# Patient Record
Sex: Male | Born: 1935 | Race: White | Hispanic: No | Marital: Married | Smoking: Former smoker
Health system: Southern US, Community
[De-identification: ages and names within clinical notes are randomized; demographics above are authoritative.]

## PROBLEM LIST (undated history)

## (undated) DIAGNOSIS — I6529 Occlusion and stenosis of unspecified carotid artery: Secondary | ICD-10-CM

## (undated) DIAGNOSIS — I5032 Chronic diastolic (congestive) heart failure: Secondary | ICD-10-CM

## (undated) DIAGNOSIS — G459 Transient cerebral ischemic attack, unspecified: Secondary | ICD-10-CM

## (undated) DIAGNOSIS — I10 Essential (primary) hypertension: Secondary | ICD-10-CM

## (undated) DIAGNOSIS — IMO0002 Reserved for concepts with insufficient information to code with codable children: Secondary | ICD-10-CM

## (undated) DIAGNOSIS — E039 Hypothyroidism, unspecified: Secondary | ICD-10-CM

## (undated) DIAGNOSIS — Z952 Presence of prosthetic heart valve: Secondary | ICD-10-CM

## (undated) DIAGNOSIS — K219 Gastro-esophageal reflux disease without esophagitis: Secondary | ICD-10-CM

## (undated) DIAGNOSIS — N3941 Urge incontinence: Secondary | ICD-10-CM

## (undated) DIAGNOSIS — M069 Rheumatoid arthritis, unspecified: Secondary | ICD-10-CM

## (undated) DIAGNOSIS — E785 Hyperlipidemia, unspecified: Secondary | ICD-10-CM

## (undated) DIAGNOSIS — I2510 Atherosclerotic heart disease of native coronary artery without angina pectoris: Secondary | ICD-10-CM

## (undated) DIAGNOSIS — I639 Cerebral infarction, unspecified: Secondary | ICD-10-CM

## (undated) DIAGNOSIS — B20 Human immunodeficiency virus [HIV] disease: Secondary | ICD-10-CM

## (undated) DIAGNOSIS — I482 Chronic atrial fibrillation, unspecified: Secondary | ICD-10-CM

## (undated) DIAGNOSIS — C4490 Unspecified malignant neoplasm of skin, unspecified: Secondary | ICD-10-CM

## (undated) HISTORY — PX: CATARACT EXTRACTION W/ INTRAOCULAR LENS  IMPLANT, BILATERAL: SHX1307

## (undated) HISTORY — DX: Essential (primary) hypertension: I10

## (undated) HISTORY — DX: Hypothyroidism, unspecified: E03.9

## (undated) HISTORY — DX: Hyperlipidemia, unspecified: E78.5

## (undated) HISTORY — DX: Human immunodeficiency virus (HIV) disease: B20

## (undated) HISTORY — PX: EYE SURGERY: SHX253

## (undated) HISTORY — DX: Rheumatoid arthritis, unspecified: M06.9

## (undated) HISTORY — PX: CARDIAC CATHETERIZATION: SHX172

## (undated) HISTORY — DX: Chronic atrial fibrillation, unspecified: I48.20

## (undated) HISTORY — DX: Occlusion and stenosis of unspecified carotid artery: I65.29

## (undated) HISTORY — DX: Atherosclerotic heart disease of native coronary artery without angina pectoris: I25.10

---

## 2004-03-29 ENCOUNTER — Encounter: Admission: RE | Admit: 2004-03-29 | Discharge: 2004-03-29 | Payer: Self-pay | Admitting: Family Medicine

## 2004-10-10 ENCOUNTER — Ambulatory Visit: Payer: Self-pay | Admitting: Family Medicine

## 2004-11-05 ENCOUNTER — Ambulatory Visit: Payer: Self-pay | Admitting: *Deleted

## 2004-11-14 ENCOUNTER — Ambulatory Visit: Payer: Self-pay | Admitting: Internal Medicine

## 2005-04-08 ENCOUNTER — Ambulatory Visit: Payer: Self-pay | Admitting: Cardiology

## 2005-04-10 ENCOUNTER — Ambulatory Visit: Payer: Self-pay | Admitting: Cardiology

## 2005-06-28 ENCOUNTER — Inpatient Hospital Stay (HOSPITAL_COMMUNITY): Admission: EM | Admit: 2005-06-28 | Discharge: 2005-06-30 | Payer: Self-pay | Admitting: Emergency Medicine

## 2005-06-30 ENCOUNTER — Ambulatory Visit: Payer: Self-pay | Admitting: Cardiology

## 2005-07-02 ENCOUNTER — Ambulatory Visit: Payer: Self-pay | Admitting: Cardiology

## 2005-07-08 ENCOUNTER — Ambulatory Visit: Payer: Self-pay

## 2005-07-08 ENCOUNTER — Ambulatory Visit: Payer: Self-pay | Admitting: Cardiology

## 2005-07-16 ENCOUNTER — Ambulatory Visit: Payer: Self-pay | Admitting: Cardiology

## 2005-07-18 ENCOUNTER — Ambulatory Visit: Payer: Self-pay | Admitting: Cardiology

## 2005-09-23 ENCOUNTER — Ambulatory Visit: Payer: Self-pay | Admitting: *Deleted

## 2005-09-30 ENCOUNTER — Ambulatory Visit: Payer: Self-pay | Admitting: Cardiology

## 2005-10-02 ENCOUNTER — Ambulatory Visit: Payer: Self-pay | Admitting: Cardiology

## 2006-02-11 ENCOUNTER — Ambulatory Visit: Payer: Self-pay | Admitting: Family Medicine

## 2006-03-30 ENCOUNTER — Ambulatory Visit: Payer: Self-pay | Admitting: *Deleted

## 2006-04-02 ENCOUNTER — Ambulatory Visit: Payer: Self-pay | Admitting: Cardiology

## 2006-05-28 ENCOUNTER — Ambulatory Visit: Payer: Self-pay | Admitting: Family Medicine

## 2006-06-23 ENCOUNTER — Inpatient Hospital Stay (HOSPITAL_COMMUNITY): Admission: EM | Admit: 2006-06-23 | Discharge: 2006-06-24 | Payer: Self-pay | Admitting: Emergency Medicine

## 2006-06-23 ENCOUNTER — Ambulatory Visit: Payer: Self-pay | Admitting: Cardiology

## 2006-06-23 ENCOUNTER — Encounter: Payer: Self-pay | Admitting: Cardiology

## 2006-07-08 ENCOUNTER — Ambulatory Visit: Payer: Self-pay | Admitting: *Deleted

## 2006-09-09 ENCOUNTER — Ambulatory Visit: Payer: Self-pay | Admitting: *Deleted

## 2006-09-28 ENCOUNTER — Ambulatory Visit: Payer: Self-pay | Admitting: *Deleted

## 2006-09-28 LAB — CONVERTED CEMR LAB
Alkaline Phosphatase: 48 units/L (ref 39–117)
Bilirubin, Direct: 0.2 mg/dL (ref 0.0–0.3)
Chol/HDL Ratio, serum: 3.4
LDL Cholesterol: 60 mg/dL (ref 0–99)
Total Bilirubin: 0.9 mg/dL (ref 0.3–1.2)
Total Protein: 6.4 g/dL (ref 6.0–8.3)
VLDL: 13 mg/dL (ref 0–40)

## 2006-10-01 ENCOUNTER — Ambulatory Visit: Payer: Self-pay | Admitting: Internal Medicine

## 2006-10-20 ENCOUNTER — Ambulatory Visit: Payer: Self-pay | Admitting: *Deleted

## 2007-01-04 ENCOUNTER — Ambulatory Visit: Payer: Self-pay | Admitting: *Deleted

## 2007-01-26 ENCOUNTER — Encounter: Admission: RE | Admit: 2007-01-26 | Discharge: 2007-01-26 | Payer: Self-pay | Admitting: Sports Medicine

## 2007-02-22 ENCOUNTER — Encounter: Admission: RE | Admit: 2007-02-22 | Discharge: 2007-02-22 | Payer: Self-pay | Admitting: Sports Medicine

## 2007-02-26 ENCOUNTER — Encounter: Admission: RE | Admit: 2007-02-26 | Discharge: 2007-02-26 | Payer: Self-pay | Admitting: Sports Medicine

## 2007-03-24 ENCOUNTER — Ambulatory Visit: Payer: Self-pay | Admitting: *Deleted

## 2007-03-24 LAB — CONVERTED CEMR LAB
ALT: 18 units/L (ref 0–40)
Albumin: 3.4 g/dL — ABNORMAL LOW (ref 3.5–5.2)
BUN: 7 mg/dL (ref 6–23)
Calcium: 9.1 mg/dL (ref 8.4–10.5)
LDL Cholesterol: 50 mg/dL (ref 0–99)
Potassium: 4.1 meq/L (ref 3.5–5.1)
Sodium: 140 meq/L (ref 135–145)
Total Bilirubin: 1 mg/dL (ref 0.3–1.2)
Total CHOL/HDL Ratio: 2.7
Triglycerides: 126 mg/dL (ref 0–149)
VLDL: 25 mg/dL (ref 0–40)

## 2007-03-29 ENCOUNTER — Ambulatory Visit: Payer: Self-pay | Admitting: Cardiovascular Disease

## 2007-04-06 ENCOUNTER — Ambulatory Visit: Payer: Self-pay | Admitting: *Deleted

## 2007-04-14 ENCOUNTER — Ambulatory Visit: Payer: Self-pay

## 2007-06-15 DIAGNOSIS — I2510 Atherosclerotic heart disease of native coronary artery without angina pectoris: Secondary | ICD-10-CM | POA: Insufficient documentation

## 2007-06-15 DIAGNOSIS — Z8679 Personal history of other diseases of the circulatory system: Secondary | ICD-10-CM | POA: Insufficient documentation

## 2007-06-15 DIAGNOSIS — I252 Old myocardial infarction: Secondary | ICD-10-CM | POA: Insufficient documentation

## 2007-06-15 DIAGNOSIS — E785 Hyperlipidemia, unspecified: Secondary | ICD-10-CM

## 2007-06-15 DIAGNOSIS — I10 Essential (primary) hypertension: Secondary | ICD-10-CM

## 2007-07-16 ENCOUNTER — Ambulatory Visit: Payer: Self-pay | Admitting: Cardiology

## 2007-07-30 ENCOUNTER — Ambulatory Visit: Payer: Self-pay | Admitting: Cardiology

## 2007-07-30 LAB — CONVERTED CEMR LAB
Basophils Absolute: 0 10*3/uL (ref 0.0–0.1)
Basophils Relative: 0.2 % (ref 0.0–1.0)
Eosinophils Absolute: 0.3 10*3/uL (ref 0.0–0.6)
Eosinophils Relative: 2.4 % (ref 0.0–5.0)
HCT: 38.2 % — ABNORMAL LOW (ref 39.0–52.0)
Hemoglobin: 12.9 g/dL — ABNORMAL LOW (ref 13.0–17.0)
MCHC: 33.9 g/dL (ref 30.0–36.0)
Monocytes Relative: 8 % (ref 3.0–11.0)
Neutro Abs: 7.5 10*3/uL (ref 1.4–7.7)
WBC: 10.7 10*3/uL — ABNORMAL HIGH (ref 4.5–10.5)

## 2007-08-12 ENCOUNTER — Ambulatory Visit: Payer: Self-pay | Admitting: Cardiology

## 2007-09-23 ENCOUNTER — Ambulatory Visit: Payer: Self-pay | Admitting: Cardiology

## 2007-09-23 LAB — CONVERTED CEMR LAB
Albumin: 3.5 g/dL (ref 3.5–5.2)
Alkaline Phosphatase: 33 units/L — ABNORMAL LOW (ref 39–117)
Cholesterol: 96 mg/dL (ref 0–200)
HDL: 31.7 mg/dL — ABNORMAL LOW (ref >39.0)
Total Bilirubin: 0.6 mg/dL (ref 0.3–1.2)
Total CHOL/HDL Ratio: 3

## 2007-09-30 ENCOUNTER — Ambulatory Visit: Payer: Self-pay | Admitting: Cardiology

## 2007-10-28 ENCOUNTER — Ambulatory Visit: Payer: Self-pay | Admitting: Cardiovascular Disease

## 2007-10-28 ENCOUNTER — Ambulatory Visit: Payer: Self-pay | Admitting: Internal Medicine

## 2007-10-28 LAB — CONVERTED CEMR LAB
Digitoxin Lvl: 0.7 ng/mL — ABNORMAL LOW (ref 0.8–2.0)
GFR calc Af Amer: 120 mL/min
Potassium: 3.9 meq/L (ref 3.5–5.1)

## 2007-11-02 ENCOUNTER — Ambulatory Visit: Payer: Self-pay | Admitting: Cardiology

## 2007-11-09 ENCOUNTER — Ambulatory Visit: Payer: Self-pay | Admitting: Internal Medicine

## 2007-11-16 ENCOUNTER — Ambulatory Visit: Payer: Self-pay | Admitting: Cardiology

## 2007-11-22 ENCOUNTER — Ambulatory Visit: Payer: Self-pay | Admitting: Cardiology

## 2007-11-30 ENCOUNTER — Ambulatory Visit: Payer: Self-pay | Admitting: Cardiology

## 2007-12-14 ENCOUNTER — Ambulatory Visit: Payer: Self-pay | Admitting: Internal Medicine

## 2007-12-14 ENCOUNTER — Ambulatory Visit: Payer: Self-pay | Admitting: Cardiovascular Disease

## 2007-12-22 ENCOUNTER — Ambulatory Visit: Payer: Self-pay | Admitting: Internal Medicine

## 2007-12-22 ENCOUNTER — Ambulatory Visit: Payer: Self-pay | Admitting: Cardiology

## 2007-12-28 ENCOUNTER — Ambulatory Visit: Payer: Self-pay | Admitting: Internal Medicine

## 2008-01-04 ENCOUNTER — Ambulatory Visit: Payer: Self-pay | Admitting: Internal Medicine

## 2008-01-18 ENCOUNTER — Ambulatory Visit: Payer: Self-pay | Admitting: Internal Medicine

## 2008-01-28 ENCOUNTER — Ambulatory Visit: Payer: Self-pay | Admitting: Internal Medicine

## 2008-02-15 ENCOUNTER — Ambulatory Visit: Payer: Self-pay | Admitting: Internal Medicine

## 2008-02-24 ENCOUNTER — Ambulatory Visit: Payer: Self-pay | Admitting: Family Medicine

## 2008-02-24 DIAGNOSIS — J309 Allergic rhinitis, unspecified: Secondary | ICD-10-CM | POA: Insufficient documentation

## 2008-02-24 LAB — CONVERTED CEMR LAB
ALT: 30 units/L (ref 0–53)
AST: 31 units/L (ref 0–37)
Alkaline Phosphatase: 43 units/L (ref 39–117)
Calcium: 8.8 mg/dL (ref 8.4–10.5)
Eosinophils Absolute: 0.3 10*3/uL (ref 0.0–0.7)
Eosinophils Relative: 4 % (ref 0.0–5.0)
Hemoglobin: 12.4 g/dL — ABNORMAL LOW (ref 13.0–17.0)
LDL Cholesterol: 63 mg/dL (ref 0–99)
MCHC: 32.8 g/dL (ref 30.0–36.0)
MCV: 100.1 fL — ABNORMAL HIGH (ref 78.0–100.0)
Monocytes Relative: 7.8 % (ref 3.0–12.0)
Neutro Abs: 6.3 10*3/uL (ref 1.4–7.7)
PSA: 0.43 ng/mL (ref 0.10–4.00)
Platelets: 174 10*3/uL (ref 150–400)
Potassium: 5.4 meq/L — ABNORMAL HIGH (ref 3.5–5.1)
RBC: 3.8 M/uL — ABNORMAL LOW (ref 4.22–5.81)
RDW: 17 % — ABNORMAL HIGH (ref 11.5–14.6)
Sodium: 137 meq/L (ref 135–145)
TSH: 4.07 microintl units/mL (ref 0.35–5.50)
Total Protein: 5.7 g/dL — ABNORMAL LOW (ref 6.0–8.3)
VLDL: 19 mg/dL (ref 0–40)
WBC: 8.3 10*3/uL (ref 4.5–10.5)

## 2008-03-14 ENCOUNTER — Ambulatory Visit: Payer: Self-pay | Admitting: Internal Medicine

## 2008-03-16 ENCOUNTER — Ambulatory Visit: Payer: Self-pay | Admitting: Cardiology

## 2008-03-16 LAB — CONVERTED CEMR LAB
ALT: 37 units/L (ref 0–53)
Alkaline Phosphatase: 49 units/L (ref 39–117)
HDL: 43.3 mg/dL (ref >39.0)
Triglycerides: 63 mg/dL (ref 0–149)
VLDL: 13 mg/dL (ref 0–40)

## 2008-04-11 ENCOUNTER — Ambulatory Visit: Payer: Self-pay | Admitting: Internal Medicine

## 2008-05-09 ENCOUNTER — Ambulatory Visit: Payer: Self-pay | Admitting: Internal Medicine

## 2008-05-29 ENCOUNTER — Ambulatory Visit: Payer: Self-pay | Admitting: Family Medicine

## 2008-06-02 ENCOUNTER — Ambulatory Visit: Payer: Self-pay | Admitting: Internal Medicine

## 2008-06-08 ENCOUNTER — Ambulatory Visit: Payer: Self-pay | Admitting: Internal Medicine

## 2008-06-09 ENCOUNTER — Telehealth: Payer: Self-pay | Admitting: Family Medicine

## 2008-06-15 ENCOUNTER — Ambulatory Visit: Payer: Self-pay | Admitting: Internal Medicine

## 2008-06-19 ENCOUNTER — Ambulatory Visit: Payer: Self-pay | Admitting: Internal Medicine

## 2008-06-30 ENCOUNTER — Ambulatory Visit: Payer: Self-pay | Admitting: Internal Medicine

## 2008-08-02 ENCOUNTER — Ambulatory Visit: Payer: Self-pay | Admitting: Internal Medicine

## 2008-08-12 ENCOUNTER — Ambulatory Visit: Payer: Self-pay | Admitting: Family Medicine

## 2008-08-12 ENCOUNTER — Telehealth (INDEPENDENT_AMBULATORY_CARE_PROVIDER_SITE_OTHER): Payer: Self-pay | Admitting: *Deleted

## 2008-08-12 DIAGNOSIS — J029 Acute pharyngitis, unspecified: Secondary | ICD-10-CM | POA: Insufficient documentation

## 2008-08-13 ENCOUNTER — Encounter: Payer: Self-pay | Admitting: Family Medicine

## 2008-08-16 ENCOUNTER — Ambulatory Visit: Payer: Self-pay | Admitting: Internal Medicine

## 2008-08-29 ENCOUNTER — Ambulatory Visit: Payer: Self-pay | Admitting: Internal Medicine

## 2008-08-29 ENCOUNTER — Ambulatory Visit: Payer: Self-pay | Admitting: Family Medicine

## 2008-09-21 ENCOUNTER — Ambulatory Visit: Payer: Self-pay | Admitting: Cardiology

## 2008-09-21 LAB — CONVERTED CEMR LAB
AST: 41 units/L — ABNORMAL HIGH (ref 0–37)
Albumin: 3.8 g/dL (ref 3.5–5.2)
Bilirubin, Direct: 0.2 mg/dL (ref 0.0–0.3)
Cholesterol: 97 mg/dL (ref 0–200)
LDL Cholesterol: 45 mg/dL (ref 0–99)
Total CHOL/HDL Ratio: 2.6
Triglycerides: 76 mg/dL (ref 0–149)

## 2008-09-25 ENCOUNTER — Ambulatory Visit: Payer: Self-pay | Admitting: Internal Medicine

## 2008-09-28 ENCOUNTER — Ambulatory Visit: Payer: Self-pay | Admitting: Cardiology

## 2008-10-23 ENCOUNTER — Ambulatory Visit: Payer: Self-pay | Admitting: Family Medicine

## 2008-10-24 ENCOUNTER — Ambulatory Visit: Payer: Self-pay | Admitting: Internal Medicine

## 2008-11-21 ENCOUNTER — Ambulatory Visit: Payer: Self-pay | Admitting: Internal Medicine

## 2008-12-06 ENCOUNTER — Ambulatory Visit: Payer: Self-pay | Admitting: Internal Medicine

## 2008-12-20 ENCOUNTER — Ambulatory Visit: Payer: Self-pay | Admitting: Internal Medicine

## 2008-12-31 ENCOUNTER — Ambulatory Visit: Payer: Self-pay | Admitting: Diagnostic Radiology

## 2008-12-31 ENCOUNTER — Emergency Department (HOSPITAL_BASED_OUTPATIENT_CLINIC_OR_DEPARTMENT_OTHER): Admission: EM | Admit: 2008-12-31 | Discharge: 2008-12-31 | Payer: Self-pay | Admitting: Emergency Medicine

## 2009-01-01 ENCOUNTER — Ambulatory Visit: Payer: Self-pay | Admitting: Cardiology

## 2009-01-01 ENCOUNTER — Ambulatory Visit: Payer: Self-pay | Admitting: Internal Medicine

## 2009-01-08 ENCOUNTER — Ambulatory Visit: Payer: Self-pay | Admitting: Internal Medicine

## 2009-01-08 LAB — CONVERTED CEMR LAB: Prothrombin Time: 49.2 s — ABNORMAL HIGH (ref 10.9–13.3)

## 2009-01-11 DIAGNOSIS — R197 Diarrhea, unspecified: Secondary | ICD-10-CM | POA: Insufficient documentation

## 2009-01-12 ENCOUNTER — Ambulatory Visit: Payer: Self-pay | Admitting: Family Medicine

## 2009-01-12 DIAGNOSIS — I4891 Unspecified atrial fibrillation: Secondary | ICD-10-CM

## 2009-01-16 ENCOUNTER — Ambulatory Visit: Payer: Self-pay | Admitting: Cardiovascular Disease

## 2009-01-23 ENCOUNTER — Ambulatory Visit: Payer: Self-pay | Admitting: Internal Medicine

## 2009-02-02 ENCOUNTER — Ambulatory Visit: Payer: Self-pay | Admitting: Cardiology

## 2009-02-02 ENCOUNTER — Ambulatory Visit: Payer: Self-pay | Admitting: Cardiovascular Disease

## 2009-02-02 ENCOUNTER — Ambulatory Visit: Payer: Self-pay | Admitting: Internal Medicine

## 2009-02-07 ENCOUNTER — Ambulatory Visit: Payer: Self-pay | Admitting: Cardiology

## 2009-02-14 ENCOUNTER — Ambulatory Visit: Payer: Self-pay | Admitting: Internal Medicine

## 2009-02-21 ENCOUNTER — Ambulatory Visit: Payer: Self-pay | Admitting: Cardiology

## 2009-03-02 ENCOUNTER — Ambulatory Visit: Payer: Self-pay | Admitting: Cardiology

## 2009-03-16 ENCOUNTER — Ambulatory Visit: Payer: Self-pay | Admitting: Cardiology

## 2009-03-27 ENCOUNTER — Ambulatory Visit: Payer: Self-pay | Admitting: Cardiology

## 2009-03-29 ENCOUNTER — Ambulatory Visit: Payer: Self-pay | Admitting: Cardiology

## 2009-03-29 ENCOUNTER — Encounter: Payer: Self-pay | Admitting: Cardiology

## 2009-03-29 LAB — CONVERTED CEMR LAB
Albumin: 3.6 g/dL (ref 3.5–5.2)
Alkaline Phosphatase: 54 units/L (ref 39–117)
HDL goal, serum: 40 mg/dL
LDL Cholesterol: 48 mg/dL (ref 0–99)
LDL Goal: 100 mg/dL
Total CHOL/HDL Ratio: 3
Triglycerides: 63 mg/dL (ref 0.0–149.0)

## 2009-04-13 ENCOUNTER — Ambulatory Visit: Payer: Self-pay | Admitting: Cardiology

## 2009-04-24 ENCOUNTER — Encounter: Payer: Self-pay | Admitting: *Deleted

## 2009-04-27 ENCOUNTER — Ambulatory Visit: Payer: Self-pay | Admitting: Cardiology

## 2009-04-27 LAB — CONVERTED CEMR LAB: Protime: 16.4

## 2009-05-18 ENCOUNTER — Ambulatory Visit: Payer: Self-pay | Admitting: Cardiology

## 2009-05-18 ENCOUNTER — Encounter (INDEPENDENT_AMBULATORY_CARE_PROVIDER_SITE_OTHER): Payer: Self-pay | Admitting: Pharmacist

## 2009-05-18 LAB — CONVERTED CEMR LAB
POC INR: 5.7
Prothrombin Time: 29 s

## 2009-05-25 ENCOUNTER — Ambulatory Visit: Payer: Self-pay | Admitting: Cardiovascular Disease

## 2009-05-25 LAB — CONVERTED CEMR LAB: POC INR: 2.1

## 2009-05-30 ENCOUNTER — Encounter: Payer: Self-pay | Admitting: *Deleted

## 2009-06-08 ENCOUNTER — Ambulatory Visit: Payer: Self-pay | Admitting: Cardiology

## 2009-06-08 LAB — CONVERTED CEMR LAB
POC INR: 5.1
Prothrombin Time: 27.3 s

## 2009-06-15 ENCOUNTER — Ambulatory Visit: Payer: Self-pay | Admitting: Internal Medicine

## 2009-06-26 ENCOUNTER — Encounter (INDEPENDENT_AMBULATORY_CARE_PROVIDER_SITE_OTHER): Payer: Self-pay | Admitting: *Deleted

## 2009-07-06 ENCOUNTER — Ambulatory Visit: Payer: Self-pay | Admitting: Internal Medicine

## 2009-07-27 ENCOUNTER — Ambulatory Visit: Payer: Self-pay | Admitting: Cardiology

## 2009-08-07 ENCOUNTER — Encounter: Payer: Self-pay | Admitting: Cardiology

## 2009-08-07 ENCOUNTER — Telehealth (INDEPENDENT_AMBULATORY_CARE_PROVIDER_SITE_OTHER): Payer: Self-pay

## 2009-08-08 ENCOUNTER — Ambulatory Visit: Payer: Self-pay

## 2009-08-08 ENCOUNTER — Ambulatory Visit: Payer: Self-pay | Admitting: Cardiology

## 2009-08-14 ENCOUNTER — Ambulatory Visit: Payer: Self-pay

## 2009-08-14 ENCOUNTER — Encounter: Payer: Self-pay | Admitting: Cardiology

## 2009-08-29 ENCOUNTER — Ambulatory Visit: Payer: Self-pay | Admitting: Cardiology

## 2009-09-07 ENCOUNTER — Ambulatory Visit: Payer: Self-pay | Admitting: Family Medicine

## 2009-09-19 ENCOUNTER — Ambulatory Visit: Payer: Self-pay | Admitting: Internal Medicine

## 2009-10-01 ENCOUNTER — Ambulatory Visit: Payer: Self-pay | Admitting: Cardiology

## 2009-10-03 LAB — CONVERTED CEMR LAB
Albumin: 3.6 g/dL (ref 3.5–5.2)
Alkaline Phosphatase: 59 units/L (ref 39–117)
HDL: 43.8 mg/dL (ref >39.00)
LDL Cholesterol: 51 mg/dL (ref 0–99)
Total Bilirubin: 1 mg/dL (ref 0.3–1.2)
Total CHOL/HDL Ratio: 2
Total Protein: 6 g/dL (ref 6.0–8.3)

## 2009-10-04 ENCOUNTER — Ambulatory Visit: Payer: Self-pay | Admitting: Internal Medicine

## 2009-10-17 ENCOUNTER — Ambulatory Visit: Payer: Self-pay | Admitting: Internal Medicine

## 2009-11-14 ENCOUNTER — Ambulatory Visit: Payer: Self-pay | Admitting: Internal Medicine

## 2009-12-12 ENCOUNTER — Ambulatory Visit: Payer: Self-pay | Admitting: Cardiovascular Disease

## 2009-12-12 LAB — CONVERTED CEMR LAB: POC INR: 3

## 2010-01-09 ENCOUNTER — Ambulatory Visit: Payer: Self-pay | Admitting: Internal Medicine

## 2010-02-07 ENCOUNTER — Ambulatory Visit: Payer: Self-pay | Admitting: Internal Medicine

## 2010-02-07 LAB — CONVERTED CEMR LAB: POC INR: 3.3

## 2010-03-07 ENCOUNTER — Ambulatory Visit: Payer: Self-pay | Admitting: Internal Medicine

## 2010-03-07 LAB — CONVERTED CEMR LAB: POC INR: 2.5

## 2010-03-08 ENCOUNTER — Telehealth (INDEPENDENT_AMBULATORY_CARE_PROVIDER_SITE_OTHER): Payer: Self-pay | Admitting: *Deleted

## 2010-03-13 ENCOUNTER — Telehealth: Payer: Self-pay | Admitting: Cardiology

## 2010-03-30 ENCOUNTER — Inpatient Hospital Stay (HOSPITAL_COMMUNITY): Admission: EM | Admit: 2010-03-30 | Discharge: 2010-04-02 | Payer: Self-pay | Admitting: Emergency Medicine

## 2010-03-30 ENCOUNTER — Ambulatory Visit: Payer: Self-pay | Admitting: Cardiology

## 2010-03-31 ENCOUNTER — Encounter (INDEPENDENT_AMBULATORY_CARE_PROVIDER_SITE_OTHER): Payer: Self-pay | Admitting: Internal Medicine

## 2010-03-31 LAB — CONVERTED CEMR LAB: LDL Cholesterol: 37 mg/dL

## 2010-04-01 ENCOUNTER — Ambulatory Visit: Payer: Self-pay | Admitting: Vascular Surgery

## 2010-04-01 ENCOUNTER — Encounter (INDEPENDENT_AMBULATORY_CARE_PROVIDER_SITE_OTHER): Payer: Self-pay | Admitting: Internal Medicine

## 2010-04-02 ENCOUNTER — Encounter: Payer: Self-pay | Admitting: Cardiology

## 2010-04-02 ENCOUNTER — Encounter (INDEPENDENT_AMBULATORY_CARE_PROVIDER_SITE_OTHER): Payer: Self-pay | Admitting: Internal Medicine

## 2010-04-04 ENCOUNTER — Ambulatory Visit: Payer: Self-pay | Admitting: Internal Medicine

## 2010-04-04 ENCOUNTER — Ambulatory Visit: Payer: Self-pay | Admitting: Family Medicine

## 2010-04-16 ENCOUNTER — Ambulatory Visit: Payer: Self-pay | Admitting: Internal Medicine

## 2010-04-16 ENCOUNTER — Ambulatory Visit: Payer: Self-pay | Admitting: Cardiology

## 2010-04-16 LAB — CONVERTED CEMR LAB: POC INR: 3.7

## 2010-04-30 ENCOUNTER — Ambulatory Visit: Payer: Self-pay | Admitting: Cardiology

## 2010-04-30 ENCOUNTER — Ambulatory Visit: Payer: Self-pay | Admitting: Vascular Surgery

## 2010-04-30 LAB — CONVERTED CEMR LAB: POC INR: 2.4

## 2010-05-14 ENCOUNTER — Ambulatory Visit: Payer: Self-pay | Admitting: Cardiology

## 2010-05-14 LAB — CONVERTED CEMR LAB: POC INR: 2.8

## 2010-06-11 ENCOUNTER — Ambulatory Visit: Payer: Self-pay | Admitting: Internal Medicine

## 2010-06-11 LAB — CONVERTED CEMR LAB: POC INR: 1.6

## 2010-06-12 ENCOUNTER — Ambulatory Visit: Payer: Self-pay | Admitting: Cardiology

## 2010-06-19 LAB — CONVERTED CEMR LAB
ALT: 29 units/L (ref 0–53)
AST: 47 units/L — ABNORMAL HIGH (ref 0–37)
Calcium: 8.3 mg/dL — ABNORMAL LOW (ref 8.4–10.5)
Cholesterol: 83 mg/dL (ref 0–200)
Digitoxin Lvl: 0.3 ng/mL — ABNORMAL LOW (ref 0.8–2.0)
GFR calc non Af Amer: 134.2 mL/min (ref >60)
HDL: 32.9 mg/dL — ABNORMAL LOW (ref >39.00)
Potassium: 4.3 meq/L (ref 3.5–5.1)
Sodium: 140 meq/L (ref 135–145)
Total Bilirubin: 0.9 mg/dL (ref 0.3–1.2)
Total Protein: 5.2 g/dL — ABNORMAL LOW (ref 6.0–8.3)
Triglycerides: 35 mg/dL (ref 0.0–149.0)
VLDL: 7 mg/dL (ref 0.0–40.0)

## 2010-06-26 ENCOUNTER — Encounter: Payer: Self-pay | Admitting: Cardiology

## 2010-06-26 ENCOUNTER — Ambulatory Visit: Payer: Self-pay | Admitting: Cardiology

## 2010-06-26 LAB — CONVERTED CEMR LAB: POC INR: 3.8

## 2010-06-27 ENCOUNTER — Encounter: Payer: Self-pay | Admitting: Cardiology

## 2010-07-10 ENCOUNTER — Ambulatory Visit: Payer: Self-pay | Admitting: Cardiology

## 2010-07-22 ENCOUNTER — Ambulatory Visit: Payer: Self-pay | Admitting: Cardiology

## 2010-08-15 ENCOUNTER — Ambulatory Visit: Payer: Self-pay | Admitting: Family Medicine

## 2010-08-19 ENCOUNTER — Ambulatory Visit: Payer: Self-pay | Admitting: Cardiovascular Disease

## 2010-08-19 LAB — CONVERTED CEMR LAB: POC INR: 2.7

## 2010-08-22 ENCOUNTER — Telehealth: Payer: Self-pay | Admitting: Cardiology

## 2010-09-16 ENCOUNTER — Ambulatory Visit: Payer: Self-pay | Admitting: Internal Medicine

## 2010-09-24 ENCOUNTER — Ambulatory Visit: Payer: Self-pay | Admitting: Cardiology

## 2010-10-03 ENCOUNTER — Encounter: Payer: Self-pay | Admitting: Cardiology

## 2010-10-08 ENCOUNTER — Ambulatory Visit: Payer: Self-pay | Admitting: Cardiology

## 2010-10-09 LAB — CONVERTED CEMR LAB
ALT: 24 units/L (ref 0–53)
AST: 37 units/L (ref 0–37)
Albumin: 3.5 g/dL (ref 3.5–5.2)
HDL: 51.1 mg/dL (ref >39.00)
Total Bilirubin: 1.1 mg/dL (ref 0.3–1.2)
Total CHOL/HDL Ratio: 2
Triglycerides: 39 mg/dL (ref 0.0–149.0)
VLDL: 7.8 mg/dL (ref 0.0–40.0)

## 2010-10-10 ENCOUNTER — Ambulatory Visit: Payer: Self-pay

## 2010-10-14 ENCOUNTER — Ambulatory Visit: Payer: Self-pay | Admitting: Cardiovascular Disease

## 2010-10-14 LAB — CONVERTED CEMR LAB: POC INR: 2.5

## 2010-11-11 ENCOUNTER — Ambulatory Visit: Payer: Self-pay | Admitting: Internal Medicine

## 2010-11-11 LAB — CONVERTED CEMR LAB: POC INR: 1.9

## 2010-12-09 ENCOUNTER — Ambulatory Visit: Admission: RE | Admit: 2010-12-09 | Discharge: 2010-12-09 | Payer: Self-pay | Source: Home / Self Care

## 2010-12-09 LAB — CONVERTED CEMR LAB: POC INR: 1.8

## 2010-12-26 NOTE — Assessment & Plan Note (Signed)
Summary: rov/sp   Visit Type:  Follow-up Referring Provider:  Valera Castle Primary Provider:  Aundra Dubin, MD  CC:  dyslipidemia follow-up.  History of Present Illness:    Lipid Clinic Visit      The patient comes in today for dyslipidemia follow-up.  The patient does not complain of chest pain, muscle aches, and muscle cramps. He was just discharged from the hospital yesterday for TIA after stoping Coumadin for dental procedure.  His lipid panel was completed in the hospital.  Dietary compliance review reveals pt is limiting fats and TFA's.  Review of exercise habits reveals that the patient is working with a physical therapist at his home and they have given him exercises for his ankles, feet and legs.  He does have a stationary bike in his home and wants to start riding it again.    Lipid Management Provider  Weston Brass, PharmD  Current Medications (verified): 1)  Niaspan 1000 Mg Tbcr (Niacin (Antihyperlipidemic)) .... Take 1 Tabs At Bedtime 2)  Aspirin 81 Mg  Tbec (Aspirin) .... Take One Tablet Daily 3)  Lipitor 80 Mg  Tabs (Atorvastatin Calcium) .... Take 1/2 Tablet At Bedtime 4)  Centrum Silver   Tabs (Multiple Vitamins-Minerals) .... Once Daily 5)  Digoxin 0.125 Mg  Tabs (Digoxin) .... Once Daily 6)  Warfarin Sodium 5 Mg  Tabs (Warfarin Sodium) .... Take As Directed By Coumadin Clinic. 7)  Caltrate 600+d 600-400 Mg-Unit Tabs (Calcium Carbonate-Vitamin D) .Marland Kitchen.. 1 Tab Once Daily 8)  Methotrexate 2.5 Mg Tabs (Methotrexate Sodium) .... Take 4 Tabs Saturday and Sunday 9)  Nitroglycerin 0.4 Mg Subl (Nitroglycerin) .... One Tablet Under Tongue Every 5 Minutes As Needed For Chest Pain---May Repeat Times Three 10)  Loratadine 10 Mg Tabs (Loratadine) .... 1 Tab Once Daily 11)  Metoprolol Tartrate 50 Mg Tabs (Metoprolol Tartrate) .... 1  Tab Twice Daily 12)  Tramadol Hcl 50 Mg Tabs (Tramadol Hcl) .... Take 1 Tablet 2-3 Times Daily As Needed  Allergies (verified): No Known Drug  Allergies  Past History:  Past Medical History: Last updated: 01/12/2009 Coronary artery disease Hypertension Myocardial infarction, hx of x 2 Transient ischemic attack, hx of Hyperlipidemia Allergic rhinitis Atrial fibrillation  Family History: Last updated: 02/24/2008 father died 72, and line mother died at 84 of old age had a AAA  4 brothers one died in [place] to died of heart attackstwo sisters one died of an MI.  One died of stroke and MI  Social History: Last updated: 10/07/2008 Former Smoker Retired Married 4 daughters Alcohol use-no Drug use-no Regular exercise-yes   Vital Signs:  Patient profile:   77 year old male Height:      72 inches Weight:      210 pounds BMI:     28 .58 Pulse rate:   60 / minute BP sitting:   92 / 70  (right arm)  Impression & Recommendations:  Problem # 1:  HYPERLIPIDEMIA (ICD-272.4) Assessment Improved Pt's Lipid panel from the hospital was as follows: TC- 72, TG- 25, HDL- 30, LDL- 37 (goal<70).   Given the fact his LDL continues to decrease and is well below goal, we discussed the need to decrease his medications at this time.  We would want to keep his LDL >40 to prevent any problems with cell membrane or steroid formation.   We will decrease his Lipitor to 1/2 tablet each night and decrease Niaspan to 1 gm every night.  I encouraged him to continue his heart-healthy diet.  He currently does have PT coming to the home.  He is very willing to start increasing his exercise and wants to get back on his statonary bike.  Encouraged him to work with PT and start riding his bike for 10-20 minutes, most days of the week.  Will recheck lipid panel in  6 months.    His updated medication list for this problem includes:    Lipitor 40 Mg Tabs (Atorvastatin calcium) .Marland Kitchen... Take one tab by mouth once daily  Patient Instructions: 1)  Decrease Lipitor to 1/2 tablet each night 2)  Decrease Niaspan to 1 gm at night 3)  Increase exercise to 10-20  min. on bike 3-4 days a week 4)  Recheck labs on 11/14 at the [place] office 5)  Next Lipid Clinic Appt: 11/17 at 10:30   Lipid Panel Test Date: 03/31/2010                        Value        Units        H/L   Reference  Cholesterol:          72           mg/dL         L    (423-536) LDL Cholesterol:      37           mg/dL         L    (14-431) HDL Cholesterol:      30           mg/dL              (54-00) Triglyceride:         25           mg/dL         L    (86-761)

## 2010-12-26 NOTE — Medication Information (Signed)
Summary: rov/sp  Anticoagulant Therapy  Managed by: Eda Keys, PharmD Referring MD: Valera Castle MD PCP: Aundra Dubin, MD Supervising MD: Gala Romney MD, Reuel Boom Indication 1: Atrial Fibrillation (ICD-427.31) Indication 2: Transcient Ischemic Attack Lab Used: [facility] Site: [facility] INR POC 3.7 INR RANGE 2 - 3  Dietary changes: yes       Details: pts diet has not been the same since getting dentures  Health status changes: no    Bleeding/hemorrhagic complications: no    Recent/future hospitalizations: no    Any changes in medication regimen? no    Recent/future dental: no  Any missed doses?: no       Is patient compliant with meds? yes      Comments: Pt will have multiple dental procedures to fix dentures this coming summer and will keep [place] posted on progresss.  Hopefully, the patient will be regaining his regular diet once dentures are fixed.    Allergies: No Known Drug Allergies  Anticoagulation Management History:      The patient is taking warfarin and comes in today for a routine follow up visit.  Positive risk factors for bleeding include an age of 75 years or older.  The bleeding index is 'intermediate risk'.  Positive CHADS2 values include History of HTN and Age > 75 years old.  The start date was 10/18/2007.  His last INR was 3.6.  Anticoagulation responsible provider: Beatriz Quintela MD, Reuel Boom.  INR POC: 3.7.  Cuvette Lot#: [id number].  Exp: 06/2011.    Anticoagulation Management Assessment/Plan:      The patient's current anticoagulation dose is Warfarin sodium 5 mg  tabs: Take as directed by coumadin clinic..  The target INR is 2.0-3.0.  The next INR is due 04/30/2010.  Anticoagulation instructions were given to patient.  Results were reviewed/authorized by Eda Keys, PharmD.  He was notified by Eda Keys.         Prior Anticoagulation Instructions: INR 4.2  Skip today's dose of Coumadin then decrease dose to 1 tablet every day except 1/2  tablet on Tuesday and Friday   Current Anticoagulation Instructions: INR 3.7  Do NOT take coumadin today.  Then start NEW dosing schedule of 1/2 tablet on Monday, Wednesday, and Friday and 1 tablet all other days.  Return to clinic in 2 weeks.

## 2010-12-26 NOTE — Medication Information (Signed)
Summary: rov/sp  Anticoagulant Therapy  Managed by: Cloyde Reams, RN, BSN Referring MD: Valera Castle MD PCP: Aundra Dubin, MD Supervising MD: Daleen Squibb MD, Maisie Fus Indication 1: Atrial Fibrillation (ICD-427.31) Indication 2: Transcient Ischemic Attack Lab Used: [facility] Site: [facility] INR POC 1.8 INR RANGE 2 - 3  Dietary changes: no    Health status changes: no    Bleeding/hemorrhagic complications: no    Recent/future hospitalizations: no    Any changes in medication regimen? no    Recent/future dental: no  Any missed doses?: no       Is patient compliant with meds? yes       Allergies: No Known Drug Allergies  Anticoagulation Management History:      The patient is taking warfarin and comes in today for a routine follow up visit.  Positive risk factors for bleeding include an age of 27 years or older.  The bleeding index is 'intermediate risk'.  Positive CHADS2 values include History of HTN and Age > 54 years old.  The start date was 10/18/2007.  His last INR was 3.6.  Anticoagulation responsible provider: Daleen Squibb MD, Maisie Fus.  INR POC: 1.8.  Cuvette Lot#: [id number].  Exp: 12/2011.    Anticoagulation Management Assessment/Plan:      The patient's current anticoagulation dose is Warfarin sodium 5 mg  tabs: Take as directed by coumadin clinic..  The target INR is 2.0-3.0.  The next INR is due 12/30/2010.  Anticoagulation instructions were given to patient.  Results were reviewed/authorized by Cloyde Reams, RN, BSN.  He was notified by Cloyde Reams RN.         Prior Anticoagulation Instructions: INR 1.9  Take 1 tablet today then resume same dose of 1 tablet every day except 1/2 tablet on Monday, Wednesday and Friday.  Recheck INR in 4 weeks.   Current Anticoagulation Instructions: INR 1.8  Take 1 tablet today, then start taking 1 tablet daily except 1/2 tablet on Mondays and Fridays.  Recheck in 3 weeks.

## 2010-12-26 NOTE — Medication Information (Signed)
Summary: rov/eac  Anticoagulant Therapy  Managed by: Weston Brass, PharmD Referring MD: Valera Castle MD PCP: Aundra Dubin, MD Supervising MD: Daleen Squibb MD, Maisie Fus Indication 1: Atrial Fibrillation (ICD-427.31) Indication 2: Transcient Ischemic Attack Lab Used: [facility]  Site: [facility] INR POC 2.4 INR RANGE 2 - 3  Dietary changes: no    Health status changes: no    Bleeding/hemorrhagic complications: no    Recent/future hospitalizations: no       Details: was in the hospital recently for afib  Any changes in medication regimen? no    Recent/future dental: no  Any missed doses?: no       Is patient compliant with meds? yes       Allergies: No Known Drug Allergies  Anticoagulation Management History:      The patient is taking warfarin and comes in today for a routine follow up visit.  Positive risk factors for bleeding include an age of 75 years or older.  The bleeding index is 'intermediate risk'.  Positive CHADS2 values include History of HTN and Age > 1 years old.  The start date was 10/18/2007.  His last INR was 3.6.  Anticoagulation responsible provider: Daleen Squibb MD, Maisie Fus.  INR POC: 2.4.  Cuvette Lot#: [id number].  Exp: 06/2011.    Anticoagulation Management Assessment/Plan:      The patient's current anticoagulation dose is Warfarin sodium 5 mg  tabs: Take as directed by coumadin clinic..  The target INR is 2.0-3.0.  The next INR is due 05/14/2010.  Anticoagulation instructions were given to patient.  Results were reviewed/authorized by Weston Brass, PharmD.  He was notified by Weston Brass PharmD.         Prior Anticoagulation Instructions: INR 3.7  Do NOT take coumadin today.  Then start NEW dosing schedule of 1/2 tablet on Monday, Wednesday, and Friday and 1 tablet all other days.  Return to clinic in 2 weeks.    Current Anticoagulation Instructions: INR 2.4  Continue same dose of 1 tablet every day except 1/2 tablet on Monday, Wednesday, and Friday

## 2010-12-26 NOTE — Assessment & Plan Note (Signed)
Summary: flu shot//ccm   Nurse Visit   Allergies: No Known Drug Allergies  Orders Added: 1)  Flu Vaccine 3yrs + MEDICARE PATIENTS [Q2039] 2)  Administration Flu vaccine - MCR [G0008] Flu Vaccine Consent Questions     Do you have a history of severe allergic reactions to this vaccine? no    Any prior history of allergic reactions to egg and/or gelatin? no    Do you have a sensitivity to the preservative Thimersol? no    Do you have a past history of Guillan-Barre Syndrome? no    Do you currently have an acute febrile illness? no    Have you ever had a severe reaction to latex? no    Vaccine information given and explained to patient? yes    Are you currently pregnant? no    Lot Number:[id number]   Exp Date:05/24/2011   Site Given  Left Deltoid IM.lbmedflu 

## 2010-12-26 NOTE — Letter (Signed)
Summary: [facility] Office Note   [facility] Office Note   Imported By: Roderic Ovens 11/07/2010 15:42:43  _____________________________________________________________________  External Attachment:    Type:   Image     Comment:   External Document

## 2010-12-26 NOTE — Medication Information (Signed)
Summary: rov/sl   Anticoagulant Therapy  Managed by: Reina Fuse, PharmD Referring MD: Valera Castle MD PCP: Aundra Dubin, MD Supervising MD: Juanda Chance MD, Tewana Bohlen Indication 1: Atrial Fibrillation (ICD-427.31) Indication 2: Transcient Ischemic Attack Lab Used: [facility] Site: [facility] INR POC 2.6 INR RANGE 2 - 3  Dietary changes: no    Health status changes: no    Bleeding/hemorrhagic complications: no    Recent/future hospitalizations: no    Any changes in medication regimen? no    Recent/future dental: yes     Details: Has been pending a dental extraction. No appointment yet.   Any missed doses?: no       Is patient compliant with meds? yes       Allergies: No Known Drug Allergies  Anticoagulation Management History:      The patient is taking warfarin and comes in today for a routine follow up visit.  Positive risk factors for bleeding include an age of 75 years or older.  The bleeding index is 'intermediate risk'.  Positive CHADS2 values include History of HTN and Age > 45 years old.  The start date was 10/18/2007.  His last INR was 3.6.  Anticoagulation responsible provider: Juanda Chance MD, Smitty Cords.  INR POC: 2.6.  Cuvette Lot#: [id number].  Exp: 08/2011.    Anticoagulation Management Assessment/Plan:      The patient's current anticoagulation dose is Warfarin sodium 5 mg  tabs: Take as directed by coumadin clinic..  The target INR is 2.0-3.0.  The next INR is due 07/31/2010.  Anticoagulation instructions were given to patient.  Results were reviewed/authorized by Reina Fuse, PharmD.  He was notified by Reina Fuse, PharmD.         Prior Anticoagulation Instructions: INR 3.8  Hold Coumadin on Wed, August 3.  Then continue with Coumadin 1 tablet (5 mg) on Sun, Tue, Thu, Sat and 1/2 tablet (2.5 mg) on Mon, Wed, Fri.  Return to clinic in 2 weeks.    Please call when dental appointment scheduled if need to hold Coumadin.  Current Anticoagulation Instructions: INR  2.6  Continue taking Coumadin 1 tab (5 mg) on Sun, Tues, Thur, Sat and Coumadin 0.5 tab (2.5 mg) on Mon, Wed, Fri. Return to clinic in 3 weeks.

## 2010-12-26 NOTE — Medication Information (Signed)
Summary: rov/sp  Anticoagulant Therapy  Managed by: Weston Brass, PharmD Referring MD: Valera Castle MD PCP: Aundra Dubin, MD Supervising MD: Riley Kill MD, Maisie Fus Indication 1: Atrial Fibrillation (ICD-427.31) Indication 2: Transcient Ischemic Attack Lab Used: [facility] Site: [facility] INR POC 2.8 INR RANGE 2 - 3  Dietary changes: no    Health status changes: no    Bleeding/hemorrhagic complications: no    Recent/future hospitalizations: no    Any changes in medication regimen? no    Recent/future dental: yes     Details: recently had some dental work done   Any missed doses?: no       Is patient compliant with meds? yes       Allergies: No Known Drug Allergies  Anticoagulation Management History:      The patient is taking warfarin and comes in today for a routine follow up visit.  Positive risk factors for bleeding include an age of 75 years or older.  The bleeding index is 'intermediate risk'.  Positive CHADS2 values include History of HTN and Age > 51 years old.  The start date was 10/18/2007.  His last INR was 3.6.  Anticoagulation responsible provider: Riley Kill MD, Maisie Fus.  INR POC: 2.8.  Cuvette Lot#: [id number].  Exp: 07/2011.    Anticoagulation Management Assessment/Plan:      The patient's current anticoagulation dose is Warfarin sodium 5 mg  tabs: Take as directed by coumadin clinic..  The target INR is 2.0-3.0.  The next INR is due 06/11/2010.  Anticoagulation instructions were given to patient.  Results were reviewed/authorized by Weston Brass, PharmD.  He was notified by Weston Brass PharmD.         Prior Anticoagulation Instructions: INR 2.4  Continue same dose of 1 tablet every day except 1/2 tablet on Monday, Wednesday, and Friday   Current Anticoagulation Instructions: INR 2.8  Continue same dose of 1 tablet every day except 1/2 tablet on Monday, Wednesday and Friday.

## 2010-12-26 NOTE — Medication Information (Signed)
Summary: rov coumdain - lmcq  Anticoagulant Therapy  Managed by: Leota Sauers, PharmD, BCPS, CPP Referring MD: Valera Castle MD PCP: Aundra Dubin, MD Supervising MD: Gala Romney MD, Reuel Boom Indication 1: Atrial Fibrillation (ICD-427.31) Lab Used: [facility] Site: [facility] INR POC 2.5 INR RANGE 2 - 3  Dietary changes: no    Health status changes: no    Bleeding/hemorrhagic complications: no    Recent/future hospitalizations: no    Any changes in medication regimen? yes       Details: on penicillin for future dental surgury--sched. for 03/21/10  Recent/future dental: yes     Details: dental surgery on 03/21/10  Any missed doses?: no       Is patient compliant with meds? yes      Comments: pt having 5 teeth removed 03/21/10- per pt, dentist did not mention having to come off Coumadin for procedure   Allergies: No Known Drug Allergies  Anticoagulation Management History:      The patient is taking warfarin and comes in today for a routine follow up visit.  Positive risk factors for bleeding include an age of 75 years or older.  The bleeding index is 'intermediate risk'.  Positive CHADS2 values include History of HTN and Age > 71 years old.  The start date was 10/18/2007.  His last INR was 3.6.  Anticoagulation responsible provider: Montay Vanvoorhis MD, Reuel Boom.  INR POC: 2.5.  Cuvette Lot#: [id number].  Exp: 02/2011.    Anticoagulation Management Assessment/Plan:      The patient's current anticoagulation dose is Warfarin sodium 5 mg  tabs: Take as directed by coumadin clinic..  The target INR is 2.0-3.0.  The next INR is due 04/04/2010.  Anticoagulation instructions were given to patient.  Results were reviewed/authorized by Leota Sauers, PharmD, BCPS, CPP.  He was notified by Weston Brass, PharmD.         Prior Anticoagulation Instructions: INR 3.3  1/2 tab = 2.5mg  today Thur 3/17 Then 1/2 tab  = 2.5mg  Tue and 1 tab = 5mg  all other days  Current Anticoagulation Instructions: INR  2.5  Continue same dose of 1 tablet every day except 1/2 tablet on Tuesday

## 2010-12-26 NOTE — Miscellaneous (Signed)
  Clinical Lists Changes  Medications: Removed medication of LIPITOR 40 MG TABS (ATORVASTATIN CALCIUM) take one tab by mouth once daily Added new medication of LIPITOR 20 MG TABS (ATORVASTATIN CALCIUM) Take one tablet by mouth daily. - Signed Rx of LIPITOR 20 MG TABS (ATORVASTATIN CALCIUM) Take one tablet by mouth daily.;  #30 x 11;  Signed;  Entered by: Weston Brass PharmD;  Authorized by: Gaylord Shih, MD, [facility];  Method used: Electronically to [facility] [address], [place], [place]  [address], Ph: [phone] or [phone], Fax: [phone]    Prescriptions: LIPITOR 20 MG TABS (ATORVASTATIN CALCIUM) Take one tablet by mouth daily.  #30 x 11   Entered by:   Weston Brass PharmD   Authorized by:   Gaylord Shih, MD, [facility]   Signed by:   Weston Brass PharmD on 06/26/2010   Method used:   Electronically to        [facility] (retail)       [address]       [place], [place]  [address]       Ph: [phone] or [phone]       Fax: [phone]   RxID:   [id number]

## 2010-12-26 NOTE — Letter (Signed)
Summary: [facility]   Imported By: Marylou Mccoy 07/16/2010 14:47:44  _____________________________________________________________________  External Attachment:    Type:   Image     Comment:   External Document

## 2010-12-26 NOTE — Medication Information (Signed)
Summary: rov/ewj  Anticoagulant Therapy  Managed by: Bethena Midget, RN, BSN Referring MD: Valera Castle MD PCP: Aundra Dubin, MD Supervising MD: Eden Emms MD, Theron Arista Indication 1: Atrial Fibrillation (ICD-427.31) Lab Used: [facility] Site: [facility] INR POC 3.0 INR RANGE 2 - 3  Dietary changes: no    Health status changes: no    Bleeding/hemorrhagic complications: no    Recent/future hospitalizations: no    Any changes in medication regimen? no    Recent/future dental: no  Any missed doses?: no       Is patient compliant with meds? yes       Allergies: No Known Drug Allergies  Anticoagulation Management History:      The patient is taking warfarin and comes in today for a routine follow up visit.  Positive risk factors for bleeding include an age of 75 years or older.  The bleeding index is 'intermediate risk'.  Positive CHADS2 values include History of HTN and Age > 75 years old.  The start date was 10/18/2007.  His last INR was 3.6.  Anticoagulation responsible provider: Eden Emms MD, Theron Arista.  INR POC: 3.0.  Cuvette Lot#: [id number].  Exp: 02/2011.    Anticoagulation Management Assessment/Plan:      The patient's current anticoagulation dose is Warfarin sodium 5 mg  tabs: Take as directed by coumadin clinic..  The target INR is 2.0-3.0.  The next INR is due 01/09/2010.  Anticoagulation instructions were given to patient.  Results were reviewed/authorized by Bethena Midget, RN, BSN.  He was notified by Bethena Midget, RN, BSN.         Prior Anticoagulation Instructions: INR 2.2  Continue on same dosage 1 tablet daily except 1/2 tablet on Tuesdays.  Recheck in 4 weeks.    Current Anticoagulation Instructions: INR 3.0 Today take 2.5mg s then resume 5mg s daily except 2.5mg s of Tuesdays. Recheck in 4 weeks.

## 2010-12-26 NOTE — Medication Information (Signed)
Summary: rov/tm  Anticoagulant Therapy  Managed by: Cloyde Reams, RN, BSN Referring MD: Valera Castle MD PCP: Aundra Dubin, MD Supervising MD: Johney Frame MD, Fayrene Fearing Indication 1: Atrial Fibrillation (ICD-427.31) Indication 2: Transcient Ischemic Attack Lab Used: [facility] Site: [facility] INR POC 2.9 INR RANGE 2 - 3  Dietary changes: no    Health status changes: no    Bleeding/hemorrhagic complications: no    Recent/future hospitalizations: no    Any changes in medication regimen? no    Recent/future dental: no  Any missed doses?: no       Is patient compliant with meds? yes       Allergies: No Known Drug Allergies  Anticoagulation Management History:      The patient is taking warfarin and comes in today for a routine follow up visit.  Positive risk factors for bleeding include an age of 37 years or older.  The bleeding index is 'intermediate risk'.  Positive CHADS2 values include History of HTN and Age > 12 years old.  The start date was 10/18/2007.  His last INR was 3.6.  Anticoagulation responsible provider: Allred MD, Fayrene Fearing.  INR POC: 2.9.  Cuvette Lot#: [id number].  Exp: 10/2011.    Anticoagulation Management Assessment/Plan:      The patient's current anticoagulation dose is Warfarin sodium 5 mg  tabs: Take as directed by coumadin clinic..  The target INR is 2.0-3.0.  The next INR is due 10/14/2010.  Anticoagulation instructions were given to patient.  Results were reviewed/authorized by Cloyde Reams, RN, BSN.  He was notified by Cloyde Reams RN.         Prior Anticoagulation Instructions: INR 2.7 Continue 5mg s daily except 2.5mg s on Mondays, Wednesdays and Fridays. Recheck in 4 weeks.   Current Anticoagulation Instructions: INR 2.9  Continue on same dosage 5mg  daily except 2.5mg  on Mondays, Wednesdays, and Fridays.  Recheck in 4 weeks.

## 2010-12-26 NOTE — Progress Notes (Signed)
Summary: Dental Procedure   Phone Note Call from Patient   Summary of Call: Pt called.  He is having 5 teeth removed on 4/28.  He does not remember the dentist giving him any special instructions reguarding his Coumadin.  He is worried about bleeding afterwards.  I told him most of the time we do not have to hold Coumadin for simple extractions but it is up to the dentist.  We will try to contact the dental office and confirm he is okay to remain on his Coumadin.   Dentist- Dr. Lovenia Shuck phone number [phone] Initial call taken by: Weston Brass PharmD,  March 08, 2010 2:10 PM  Follow-up for Phone Call        [facility] Dentist office.  Their office is closed until 4/25.  Will try to call again once they are open.  Follow-up by: Weston Brass PharmD,  March 08, 2010 2:11 PM  Additional Follow-up for Phone Call Additional follow up Details #1::        Spoke with Dr. Peggye Ley office.  They just requested he hold Coumadin the night before the procedure.  Since I last spoke with the patient, he contacted Dr. Daleen Squibb and Dr. Daleen Squibb said it was okay for him to hold his Coumadin.  Pt stated he has held his Coumadin since last Saturday (4/23).  Instructed him to restart at his normal dose of Coumadin after procedure and we will recheck his INR on 5/12.  Additional Follow-up by: Weston Brass PharmD,  March 18, 2010 10:36 AM

## 2010-12-26 NOTE — Assessment & Plan Note (Signed)
Summary: rov/sp   Lipid Clinic Visit      The patient comes in today for dyslipidemia follow-up.  The patient does not complain of chest pain, muscle aches, and muscle cramps. Current lipid regimen includes Niaspan 1000 mg - 2 tablets qHS, and Lipitor 20 mg daily.  Patient reports medication compliance and denies any medication issues.  Dietary compliance review reveals pt is limiting fats and TFA's.  He reports getting plenty of fruits and vegetables and eliminated fried foods from diet years ago.  Review of exercise habits reveals that the patient is not currently exercising.  He does have a stationary bike in his home and wants to start riding it again.    Lipid Management Provider  Eda Keys, PharmD  Allergies: No Known Drug Allergies   Vital Signs:  Patient profile:   77 year old male BP sitting:   128 / 68  (left arm)  Impression & Recommendations:  Problem # 1:  HYPERLIPIDEMIA (ICD-272.4) Assessment Improved Lipid results are as follows:  TC 106 (goal <200), TG 39 (goal <150), HDL 51.1 (goal >40), LDL 47 (goal <70).  Patient is at goal for all values with significant increase in HDL up from 32.9.  Patient reports that he has made no significant changes to diet or exercise, so I am unsure why the increae in HDL.  I have discussed with patient the importance of high fiber, low fat diet as well as exercise if he is able.  We will continue the current dosing regimen and will follow-up in 6 months.  If LDL continues to be low, could consider decreasing the statin dose.    His updated medication list for this problem includes:    Niaspan 1000 Mg Cr-tabs (Niacin (antihyperlipidemic)) .Marland Kitchen... 2 tabs at bedtime    Lipitor 20 Mg Tabs (Atorvastatin calcium) .Marland Kitchen... Take one tablet by mouth daily.

## 2010-12-26 NOTE — Progress Notes (Signed)
Summary: RX LIPITOR SENT [facility] TODAY  Medications Added LIPITOR 20 MG TABS (ATORVASTATIN CALCIUM) Take one tablet by mouth daily.       Phone Note Refill Request Message from:  Patient on August 22, 2010 3:39 PM  Refills Requested: Medication #1:  LIPITOR 20 MG TABS Take one tablet by mouth daily.Marland Kitchen [facility] not need 80 mg any longer   Method Requested: Fax to Local Pharmacy Initial call taken by: Glynda Jaeger,  August 22, 2010 3:39 PM Caller: Patient [phone] Reason for Call: Talk to Nurse Summary of Call: pt calling re changing an rx for him, lipitor 20mg  is what he is taking,     New/Updated Medications: LIPITOR 20 MG TABS (ATORVASTATIN CALCIUM) Take one tablet by mouth daily. Prescriptions: LIPITOR 20 MG TABS (ATORVASTATIN CALCIUM) Take one tablet by mouth daily.  #90 x 3   Entered by:   Danielle Rankin, CMA   Authorized by:   Gaylord Shih, MD, [facility]   Signed by:   Danielle Rankin, CMA on 08/22/2010   Method used:   Faxed to ...       [facility] [employer] education [facility])       [address] [address]       [place], [place]  [address]       Ph: [phone]       Fax: [phone]   RxID:   [id number]

## 2010-12-26 NOTE — Progress Notes (Signed)
Summary: dental surgery   Phone Note Call from Patient Call back at Home Phone [phone]   Caller: Patient Summary of Call: pt is sch for dental surgery on 4/28, does he need to come off of Coumadin and ASA? Initial call taken by: Migdalia Dk,  March 13, 2010 2:11 PM  Follow-up for Phone Call        PER PT IS HAVING 5 EXTRACTIONS  MAY NEED REQUIRE CUTTING KNOWS FOR SURE 1 TOOTH HAS BROKEN AT GUM LINE PER PT . PKLEASE ADVISE. Follow-up by: Scherrie Bateman, LPN,  March 13, 2010 2:18 PM  Additional Follow-up for Phone Call Additional follow up Details #1::        He can stop his Coumadin for procedure. Additional Follow-up by: Gaylord Shih, MD, [facility],  March 13, 2010 3:07 PM     Appended Document: dental surgery PT AWARE./CY

## 2010-12-26 NOTE — Assessment & Plan Note (Signed)
Summary: 6 mo f/u ./cy  Medications Added METHOTREXATE 2.5 MG TABS (METHOTREXATE SODIUM) take 3 tabs saturday and "sunday      Allergies Added: NKDA  Visit Type:  6 mo f/u  Referring Provider:    Primary Provider:  William W. Truslow, MD  CC:  edema/legs....denies any other cardiac complaints today.  History of Present Illness: [name] [name] returns for evaluation management of his atrial fibrillation and coronary disease.  He's having no symptoms of angina or ischemia. Denies any palpitations. He has edema in his legs but no weeping. No ulcers are unhealed areas.  His having no problems with Coumadin.he is having no symptoms of TIAs or mini strokes.  Current Medications (verified): 1)  Aspirin 81 Mg  Tbec (Aspirin) .... Take One Tablet Daily 2)  Centrum Silver   Tabs (Multiple Vitamins-Minerals) .... Once Daily 3)  Digoxin 0.125 Mg  Tabs (Digoxin) .... Once Daily 4)  Warfarin Sodium 5 Mg  Tabs (Warfarin Sodium) .... Take As Directed By Coumadin Clinic. 5)  Caltrate 600+d 600-400 Mg-Unit Tabs (Calcium Carbonate-Vitamin D) .... 1 Tab Once Daily 6)  Methotrexate 2.5 Mg Tabs (Methotrexate Sodium) .... Take 3 Tabs Saturday and Sunday 7)  Nitroglycerin 0.4 Mg Subl (Nitroglycerin) .... One Tablet Under Tongue Every 5 Minutes As Needed For Chest Pain---May Repeat Times Three 8)  Loratadine 10 Mg Tabs (Loratadine) .... 1 Tab Once Daily 9)  Metoprolol Tartrate 50 Mg Tabs (Metoprolol Tartrate) .... 1  Tab Twice Daily 10)  Tramadol Hcl 50 Mg Tabs (Tramadol Hcl) .... Take 1 Tablet 2-3 Times Daily As Needed 11)  Niaspan 1000 Mg Cr-Tabs (Niacin (Antihyperlipidemic)) .... 2 Tabs At Bedtime 12)  Lipitor 20 Mg Tabs (Atorvastatin Calcium) .... Take One Tablet By Mouth Daily.  Allergies (verified): No Known Drug Allergies  Past History:  Past Medical History: Last updated: 01/12/2009 Coronary artery disease Hypertension Myocardial infarction, hx of x 2 Transient ischemic attack, hx  of Hyperlipidemia Allergic rhinitis Atrial fibrillation  Family History: Last updated: 02/24/2008 father died 72, and line mother died at 84 of old age had a AAA  4 brothers one died in [place] to died of heart attackstwo sisters one died of an MI.  One died of stroke and MI  Social History: Last updated: 10/07/2008 Former Smoker Retired Married 4 daughters Alcohol use-no Drug use-no Regular exercise-yes  Risk Factors: Exercise: yes (02/24/2008)  Risk Factors: Smoking Status: quit (06/15/2007)  Review of Systems       negative other than history of present illness  Vital Signs:  Patient profile:   77 year old male Height:      72 inches Weight:      201.12 pounds BMI:     27" .38 Pulse rate:   73 / minute Pulse rhythm:   irregular BP sitting:   104 / 76  (left arm) Cuff size:   large  Vitals Entered By: Danielle Rankin, CMA (September 24, 2010 12:15 PM)  Physical Exam  General:  elderly, obese Head:  normocephalic and atraumatic Eyes:  PERRLA/EOM intact; conjunctiva and lids normal. Neck:  Neck supple, no JVD. No masses, thyromegaly or abnormal cervical nodes. Lungs:  Clear bilaterally to auscultation and percussion. Heart:  PMI nondisplaced, regular rate and rhythm, variable S1-S2, no obvious carotid bruits Msk:  decreased ROM.   Pulses:  diminished but present in the lower extremities Extremities:  3+ left pedal edema and 3+ right pedal edema.   Neurologic:  Alert and oriented x 3. Skin:  Intact  without lesions or rashes. Psych:  Normal affect.   EKG  Procedure date:  09/24/2010  Findings:      atrial fibrillation, occasional PVC, no acute change  Impression & Recommendations:  Problem # 1:  ATRIAL FIBRILLATION (ICD-427.31) Assessment Unchanged  His updated medication list for this problem includes:    Aspirin 81 Mg Tbec (Aspirin) .Marland Kitchen... Take one tablet daily    Digoxin 0.125 Mg Tabs (Digoxin) ..... Once daily    Warfarin Sodium 5 Mg Tabs (Warfarin  sodium) .Marland Kitchen... Take as directed by coumadin clinic.    Metoprolol Tartrate 50 Mg Tabs (Metoprolol tartrate) .Marland Kitchen... 1  tab twice daily  Problem # 2:  TRANSIENT ISCHEMIC ATTACK, HX OF (ICD-V12.50) Assessment: Improved  Problem # 3:  HYPERLIPIDEMIA (ICD-272.4)  His updated medication list for this problem includes:    Niaspan 1000 Mg Cr-tabs (Niacin (antihyperlipidemic)) .Marland Kitchen... 2 tabs at bedtime    Lipitor 20 Mg Tabs (Atorvastatin calcium) .Marland Kitchen... Take one tablet by mouth daily.  Problem # 4:  CORONARY ARTERY DISEASE (ICD-414.00) Assessment: Unchanged continue medical therapy His updated medication list for this problem includes:    Aspirin 81 Mg Tbec (Aspirin) .Marland Kitchen... Take one tablet daily    Warfarin Sodium 5 Mg Tabs (Warfarin sodium) .Marland Kitchen... Take as directed by coumadin clinic.    Nitroglycerin 0.4 Mg Subl (Nitroglycerin) ..... One tablet under tongue every 5 minutes as needed for chest pain---may repeat times three    Metoprolol Tartrate 50 Mg Tabs (Metoprolol tartrate) .Marland Kitchen... 1  tab twice daily  Problem # 5:  MYOCARDIAL INFARCTION, HX OF (ICD-412) Assessment: Unchanged  His updated medication list for this problem includes:    Aspirin 81 Mg Tbec (Aspirin) .Marland Kitchen... Take one tablet daily    Warfarin Sodium 5 Mg Tabs (Warfarin sodium) .Marland Kitchen... Take as directed by coumadin clinic.    Nitroglycerin 0.4 Mg Subl (Nitroglycerin) ..... One tablet under tongue every 5 minutes as needed for chest pain---may repeat times three    Metoprolol Tartrate 50 Mg Tabs (Metoprolol tartrate) .Marland Kitchen... 1  tab twice daily  Clinical Reports Reviewed:  Nuclear Study:  04/14/2007:  No Ischemia Not Gated Mild decrease activity inferior Turhan Chill thought not to be infarction   Patient Instructions: 1)  Your physician recommends that you schedule a follow-up appointment in: 12 months

## 2010-12-26 NOTE — Medication Information (Signed)
Summary: rov/nb   Anticoagulant Therapy  Managed by: Weston Brass, PharmD Referring MD: Valera Castle MD PCP: Aundra Dubin, MD Supervising MD: Tenny Craw MD, Gunnar Fusi Indication 1: Atrial Fibrillation (ICD-427.31) Indication 2: Transcient Ischemic Attack Lab Used: [facility] Site: [facility] INR POC 1.9 INR RANGE 2 - 3  Dietary changes: no    Health status changes: no    Bleeding/hemorrhagic complications: no    Recent/future hospitalizations: no    Any changes in medication regimen? no    Recent/future dental: no  Any missed doses?: no       Is patient compliant with meds? yes       Allergies: No Known Drug Allergies  Anticoagulation Management History:      The patient is taking warfarin and comes in today for a routine follow up visit.  Positive risk factors for bleeding include an age of 75 years or older.  The bleeding index is 'intermediate risk'.  Positive CHADS2 values include History of HTN and Age > 60 years old.  The start date was 10/18/2007.  His last INR was 3.6.  Anticoagulation responsible provider: Tenny Craw MD, Gunnar Fusi.  INR POC: 1.9.  Cuvette Lot#: [id number].  Exp: 12/2011.    Anticoagulation Management Assessment/Plan:      The patient's current anticoagulation dose is Warfarin sodium 5 mg  tabs: Take as directed by coumadin clinic..  The target INR is 2.0-3.0.  The next INR is due 12/09/2010.  Anticoagulation instructions were given to patient.  Results were reviewed/authorized by Weston Brass, PharmD.  He was notified by Weston Brass PharmD.         Prior Anticoagulation Instructions: INR 2.5 Continue previous dose of 1 tablet everyday except 0.5 tablet on Monday, Wednesday, and Friday Recheck INR in 4 weeks  Current Anticoagulation Instructions: INR 1.9  Take 1 tablet today then resume same dose of 1 tablet every day except 1/2 tablet on Monday, Wednesday and Friday.  Recheck INR in 4 weeks.

## 2010-12-26 NOTE — Medication Information (Signed)
Summary: rov/sp      Allergies Added: NKDA Anticoagulant Therapy  Managed by: Weston Brass, PharmD Referring MD: Valera Castle MD PCP: Aundra Dubin, MD Supervising MD: Riley Kill MD, Maisie Fus Indication 1: Atrial Fibrillation (ICD-427.31) Indication 2: Transcient Ischemic Attack Lab Used: [facility] Site: [facility] INR POC 3.8 INR RANGE 2 - 3  Dietary changes: no    Health status changes: no    Bleeding/hemorrhagic complications: no    Recent/future hospitalizations: no    Any changes in medication regimen? no    Recent/future dental: yes     Details: Upcoming extraction, pt to call for appt.  Any missed doses?: no       Is patient compliant with meds? yes       Current Medications (verified): 1)  Aspirin 81 Mg  Tbec (Aspirin) .... Take One Tablet Daily 2)  Centrum Silver   Tabs (Multiple Vitamins-Minerals) .... Once Daily 3)  Digoxin 0.125 Mg  Tabs (Digoxin) .... Once Daily 4)  Warfarin Sodium 5 Mg  Tabs (Warfarin Sodium) .... Take As Directed By Coumadin Clinic. 5)  Caltrate 600+d 600-400 Mg-Unit Tabs (Calcium Carbonate-Vitamin D) .Marland Kitchen.. 1 Tab Once Daily 6)  Methotrexate 2.5 Mg Tabs (Methotrexate Sodium) .... Take 4 Tabs Saturday and Sunday 7)  Nitroglycerin 0.4 Mg Subl (Nitroglycerin) .... One Tablet Under Tongue Every 5 Minutes As Needed For Chest Pain---May Repeat Times Three 8)  Loratadine 10 Mg Tabs (Loratadine) .Marland Kitchen.. 1 Tab Once Daily 9)  Metoprolol Tartrate 50 Mg Tabs (Metoprolol Tartrate) .Marland Kitchen.. 1  Tab Twice Daily 10)  Tramadol Hcl 50 Mg Tabs (Tramadol Hcl) .... Take 1 Tablet 2-3 Times Daily As Needed 11)  Lipitor 40 Mg Tabs (Atorvastatin Calcium) .... Take One Tab By Mouth Once Daily 12)  Niaspan 1000 Mg Cr-Tabs (Niacin (Antihyperlipidemic)) .... 2 Tabs At Bedtime  Allergies (verified): No Known Drug Allergies  Anticoagulation Management History:      The patient is taking warfarin and comes in today for a routine follow up visit.  Positive risk factors for  bleeding include an age of 32 years or older.  The bleeding index is 'intermediate risk'.  Positive CHADS2 values include History of HTN and Age > 47 years old.  The start date was 10/18/2007.  His last INR was 3.6.  Anticoagulation responsible provider: Riley Kill MD, Maisie Fus.  INR POC: 3.8.  Cuvette Lot#: [id number].  Exp: 08/2011.    Anticoagulation Management Assessment/Plan:      The patient's current anticoagulation dose is Warfarin sodium 5 mg  tabs: Take as directed by coumadin clinic..  The target INR is 2.0-3.0.  The next INR is due 07/10/2010.  Anticoagulation instructions were given to patient.  Results were reviewed/authorized by Weston Brass, PharmD.  He was notified by Liana Gerold, PharmD Candidate.         Prior Anticoagulation Instructions: INR 1.6  Take 1 1/2 tablets today then resume same dose of 1 tablet every day except 1/2 tablet on Monday, Wednesday and Friday.   Current Anticoagulation Instructions: INR 3.8  Hold Coumadin on Wed, August 3.  Then continue with Coumadin 1 tablet (5 mg) on Sun, Tue, Thu, Sat and 1/2 tablet (2.5 mg) on Mon, Wed, Fri.  Return to clinic in 2 weeks.    Please call when dental appointment scheduled if need to hold Coumadin.

## 2010-12-26 NOTE — Medication Information (Signed)
Summary: rov/ewj  Anticoagulant Therapy  Managed by: Leota Sauers, PharmD, BCPS, CPP Referring MD: Valera Castle MD PCP: Aundra Dubin, MD Supervising MD: Shirlee Latch MD, Rodney Wigger Indication 1: Atrial Fibrillation (ICD-427.31) Lab Used: [facility] Site: [facility] INR POC 3.3 INR RANGE 2 - 3  Dietary changes: no    Health status changes: no    Bleeding/hemorrhagic complications: no    Recent/future hospitalizations: no    Any changes in medication regimen? no    Recent/future dental: no  Any missed doses?: no       Is patient compliant with meds? yes       Current Medications (verified): 1)  Altace 10 Mg Caps (Ramipril) .... Take One Tablet Daily 2)  Niaspan 1000 Mg Tbcr (Niacin (Antihyperlipidemic)) .... Take 2 Tabs At Bedtime 3)  Aspirin 81 Mg  Tbec (Aspirin) .... Take One Tablet Daily 4)  Lipitor 80 Mg  Tabs (Atorvastatin Calcium) .... Take One Talb At Bedtime 5)  Centrum Silver   Tabs (Multiple Vitamins-Minerals) .... Once Daily 6)  Digoxin 0.125 Mg  Tabs (Digoxin) .... Once Daily 7)  Warfarin Sodium 5 Mg  Tabs (Warfarin Sodium) .... Take As Directed By Coumadin Clinic. 8)  Caltrate 600+d 600-400 Mg-Unit Tabs (Calcium Carbonate-Vitamin D) .Marland Kitchen.. 1 Tab Once Daily 9)  Methotrexate 2.5 Mg Tabs (Methotrexate Sodium) .... Take 4 Tabs Saturday and Sunday 10)  Nitroglycerin 0.4 Mg Subl (Nitroglycerin) .... One Tablet Under Tongue Every 5 Minutes As Needed For Chest Pain---May Repeat Times Three 11)  Loratadine 10 Mg Tabs (Loratadine) .Marland Kitchen.. 1 Tab Once Daily 12)  Metoprolol Tartrate 50 Mg Tabs (Metoprolol Tartrate) .Marland Kitchen.. 1 and 1/2 Tab Twice Daily 13)  Tramadol Hcl 50 Mg Tabs (Tramadol Hcl) .... Take 1 Tablet 2-3 Times Daily As Needed  Allergies (verified): No Known Drug Allergies  Anticoagulation Management History:      The patient is taking warfarin and comes in today for a routine follow up visit.  Positive risk factors for bleeding include an age of 75 years or older.  The  bleeding index is 'intermediate risk'.  Positive CHADS2 values include History of HTN and Age > 45 years old.  The start date was 10/18/2007.  His last INR was 3.6.  Anticoagulation responsible provider: Shirlee Latch MD, Marzell Isakson.  INR POC: 3.3.  Cuvette Lot#: [id number].  Exp: 02/2011.    Anticoagulation Management Assessment/Plan:      The patient's current anticoagulation dose is Warfarin sodium 5 mg  tabs: Take as directed by coumadin clinic..  The target INR is 2.0-3.0.  The next INR is due 03/07/2010.  Anticoagulation instructions were given to patient.  Results were reviewed/authorized by Leota Sauers, PharmD, BCPS, CPP.         Prior Anticoagulation Instructions: INR 2.7  Continue on same dosage 5mg  daily except 2.5mg  on Tuesdays.  Recheck in 4 weeks.    Current Anticoagulation Instructions: INR 3.3  1/2 tab = 2.5mg  today Thur 3/17 Then 1/2 tab  = 2.5mg  Tue and 1 tab = 5mg  all other days

## 2010-12-26 NOTE — Miscellaneous (Signed)
Summary: Lovenox Bridge in Future    Clinical Lists Changes  Pt admitted to hospital after possible TIA after holding Coumadin for dental procedure.  Per Dr. Daleen Squibb will need Lovenox bridging in the future if he needs to hold his Coumadin.  Weston Brass PharmD  Apr 02, 2010 10:06 AM

## 2010-12-26 NOTE — Medication Information (Signed)
Summary: rov/sp  Anticoagulant Therapy  Managed by: Weston Brass, PharmD Referring MD: Valera Castle MD PCP: Aundra Dubin, MD Supervising MD: Johney Frame MD, Fayrene Fearing Indication 1: Atrial Fibrillation (ICD-427.31) Indication 2: Transcient Ischemic Attack Lab Used: [facility] Site: [facility] INR POC 4.2 INR RANGE 2 - 3  Dietary changes: yes       Details: having some issues with eating due to dental procedures  Health status changes: no    Bleeding/hemorrhagic complications: no    Recent/future hospitalizations: yes       Details: pt discharged from the hospital on 5/10 after possible TIA after holding Coumadin for dental procedure.  Per Dr Daleen Squibb- pt will need lovenox bridging in the future  Any changes in medication regimen? no    Recent/future dental: yes     Details: finishing dental  work in 4-6 weeks  Any missed doses?: no       Is patient compliant with meds? yes       Allergies: No Known Drug Allergies  Anticoagulation Management History:      The patient is taking warfarin and comes in today for a routine follow up visit.  Positive risk factors for bleeding include an age of 25 years or older.  The bleeding index is 'intermediate risk'.  Positive CHADS2 values include History of HTN and Age > 75 years old.  The start date was 10/18/2007.  His last INR was 3.6.  Anticoagulation responsible provider: Brennan Karam MD, Fayrene Fearing.  INR POC: 4.2.  Cuvette Lot#: [id number].  Exp: 06/2011.    Anticoagulation Management Assessment/Plan:      The patient's current anticoagulation dose is Warfarin sodium 5 mg  tabs: Take as directed by coumadin clinic..  The target INR is 2.0-3.0.  The next INR is due 04/15/2010.  Anticoagulation instructions were given to patient.  Results were reviewed/authorized by Weston Brass, PharmD.  He was notified by Weston Brass PharmD.         Prior Anticoagulation Instructions: INR 2.5  Continue same dose of 1 tablet every day except 1/2 tablet on Tuesday   Current  Anticoagulation Instructions: INR 4.2  Skip today's dose of Coumadin then decrease dose to 1 tablet every day except 1/2 tablet on Tuesday and Friday

## 2010-12-26 NOTE — Medication Information (Signed)
Summary: rov/sp  Anticoagulant Therapy  Managed by: Bethena Midget, RN, BSN Referring MD: Valera Castle MD PCP: Aundra Dubin, MD Supervising MD: Excell Seltzer MD, Casimiro Needle Indication 1: Atrial Fibrillation (ICD-427.31) Indication 2: Transcient Ischemic Attack Lab Used: [facility] Site: [facility] INR POC 2.7 INR RANGE 2 - 3  Dietary changes: no    Health status changes: no    Bleeding/hemorrhagic complications: no    Recent/future hospitalizations: no    Any changes in medication regimen? no    Recent/future dental: no  Any missed doses?: no       Is patient compliant with meds? yes       Current Medications (verified): 1)  Aspirin 81 Mg  Tbec (Aspirin) .... Take One Tablet Daily 2)  Centrum Silver   Tabs (Multiple Vitamins-Minerals) .... Once Daily 3)  Digoxin 0.125 Mg  Tabs (Digoxin) .... Once Daily 4)  Warfarin Sodium 5 Mg  Tabs (Warfarin Sodium) .... Take As Directed By Coumadin Clinic. 5)  Caltrate 600+d 600-400 Mg-Unit Tabs (Calcium Carbonate-Vitamin D) .Marland Kitchen.. 1 Tab Once Daily 6)  Methotrexate 2.5 Mg Tabs (Methotrexate Sodium) .... Take 4 Tabs Saturday and Sunday 7)  Nitroglycerin 0.4 Mg Subl (Nitroglycerin) .... One Tablet Under Tongue Every 5 Minutes As Needed For Chest Pain---May Repeat Times Three 8)  Loratadine 10 Mg Tabs (Loratadine) .Marland Kitchen.. 1 Tab Once Daily 9)  Metoprolol Tartrate 50 Mg Tabs (Metoprolol Tartrate) .Marland Kitchen.. 1  Tab Twice Daily 10)  Tramadol Hcl 50 Mg Tabs (Tramadol Hcl) .... Take 1 Tablet 2-3 Times Daily As Needed 11)  Niaspan 1000 Mg Cr-Tabs (Niacin (Antihyperlipidemic)) .... 2 Tabs At Bedtime 12)  Lipitor 20 Mg Tabs (Atorvastatin Calcium) .... Take One Tablet By Mouth Daily.  Allergies (verified): No Known Drug Allergies  Anticoagulation Management History:      The patient is taking warfarin and comes in today for a routine follow up visit.  Positive risk factors for bleeding include an age of 41 years or older.  The bleeding index is 'intermediate  risk'.  Positive CHADS2 values include History of HTN and Age > 70 years old.  The start date was 10/18/2007.  His last INR was 3.6.  Anticoagulation responsible provider: Excell Seltzer MD, Casimiro Needle.  INR POC: 2.7.  Cuvette Lot#: [id number].  Exp: 09/2011.    Anticoagulation Management Assessment/Plan:      The patient's current anticoagulation dose is Warfarin sodium 5 mg  tabs: Take as directed by coumadin clinic..  The target INR is 2.0-3.0.  The next INR is due 09/16/2010.  Anticoagulation instructions were given to patient.  Results were reviewed/authorized by Bethena Midget, RN, BSN.  He was notified by Bethena Midget, RN, BSN.         Prior Anticoagulation Instructions: INR 2.6  Continue 1 tablet daily except 1/2 tablet Mon, Wed and Fri.  Return to clinic in 4 weeks.  Current Anticoagulation Instructions: INR 2.7 Continue 5mg s daily except 2.5mg s on Mondays, Wednesdays and Fridays. Recheck in 4 weeks.

## 2010-12-26 NOTE — Medication Information (Signed)
Summary: rov/sp  Anticoagulant Therapy  Managed by: Weston Brass, PharmD Referring MD: Valera Castle MD PCP: Aundra Dubin, MD Supervising MD: Graciela Husbands MD, Viviann Spare Indication 1: Atrial Fibrillation (ICD-427.31) Indication 2: Transcient Ischemic Attack Lab Used: [facility] Site: [facility] INR POC 1.6 INR RANGE 2 - 3  Dietary changes: yes       Details: less leafy greens due to dental issues  Health status changes: no    Bleeding/hemorrhagic complications: no    Recent/future hospitalizations: no    Any changes in medication regimen? no    Recent/future dental: no  Any missed doses?: no       Is patient compliant with meds? yes       Allergies: No Known Drug Allergies  Anticoagulation Management History:      The patient is taking warfarin and comes in today for a routine follow up visit.  Positive risk factors for bleeding include an age of 75 years or older.  The bleeding index is 'intermediate risk'.  Positive CHADS2 values include History of HTN and Age > 46 years old.  The start date was 10/18/2007.  His last INR was 3.6.  Anticoagulation responsible provider: Graciela Husbands MD, Viviann Spare.  INR POC: 1.6.  Cuvette Lot#: [id number].  Exp: 08/2011.    Anticoagulation Management Assessment/Plan:      The patient's current anticoagulation dose is Warfarin sodium 5 mg  tabs: Take as directed by coumadin clinic..  The target INR is 2.0-3.0.  The next INR is due 06/25/2010.  Anticoagulation instructions were given to patient.  Results were reviewed/authorized by Weston Brass, PharmD.  He was notified by Weston Brass PharmD.         Prior Anticoagulation Instructions: INR 2.8  Continue same dose of 1 tablet every day except 1/2 tablet on Monday, Wednesday and Friday.   Current Anticoagulation Instructions: INR 1.6  Take 1 1/2 tablets today then resume same dose of 1 tablet every day except 1/2 tablet on Monday, Wednesday and Friday.

## 2010-12-26 NOTE — Medication Information (Signed)
Summary: rov/ewj   Anticoagulant Therapy  Managed by: Weston Brass, PharmD Referring MD: Valera Castle MD PCP: Aundra Dubin, MD Supervising MD: Excell Seltzer MD, Casimiro Needle Indication 1: Atrial Fibrillation (ICD-427.31) Indication 2: Transcient Ischemic Attack Lab Used: [facility] Site: [facility] INR POC 2.5 INR RANGE 2 - 3  Dietary changes: no    Health status changes: no    Bleeding/hemorrhagic complications: no    Recent/future hospitalizations: no    Any changes in medication regimen? no    Recent/future dental: no  Any missed doses?: no       Is patient compliant with meds? yes       Allergies: No Known Drug Allergies  Anticoagulation Management History:      The patient is taking warfarin and comes in today for a routine follow up visit.  Positive risk factors for bleeding include an age of 76 years or older.  The bleeding index is 'intermediate risk'.  Positive CHADS2 values include History of HTN and Age > 37 years old.  The start date was 10/18/2007.  His last INR was 3.6.  Anticoagulation responsible provider: Excell Seltzer MD, Casimiro Needle.  INR POC: 2.5.  Cuvette Lot#: [id number].  Exp: 10/2011.    Anticoagulation Management Assessment/Plan:      The patient's current anticoagulation dose is Warfarin sodium 5 mg  tabs: Take as directed by coumadin clinic..  The target INR is 2.0-3.0.  The next INR is due 11/11/2010.  Anticoagulation instructions were given to patient.  Results were reviewed/authorized by Weston Brass, PharmD.  He was notified by Hoy Register, PharmD Candidate.         Prior Anticoagulation Instructions: INR 2.9  Continue on same dosage 5mg  daily except 2.5mg  on Mondays, Wednesdays, and Fridays.  Recheck in 4 weeks.    Current Anticoagulation Instructions: INR 2.5 Continue previous dose of 1 tablet everyday except 0.5 tablet on Monday, Wednesday, and Friday Recheck INR in 4 weeks

## 2010-12-26 NOTE — Assessment & Plan Note (Signed)
Summary: hosp fup//ccm   Vital Signs:  Patient profile:   77 year old male Weight:      211 pounds Temp:     98.2 degrees F oral Pulse rate:   56 / minute Pulse rhythm:   irregular BP sitting:   108 / 68  (left arm) Cuff size:   regular  Vitals Entered By: Kathrynn Speed CMA (Apr 04, 2010 4:13 PM) CC: follow-up visit from hospital   Primary Care Provider:  Aundra Dubin, MD  CC:  follow-up visit from hospital.  History of Present Illness: [name] [name] is an 77 year old male, who comes in today for evaluation following a hospitalization for a flare of his atrial fib.  During his hospitalization and his beta-blocker was increased to 100 mg daily from 75 daily.  Pulse today 56 and regular.  His ACE inhibitor was stopped because of hypotension.  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  However, he did have a spell of visual blurring.  Allergies: No Known Drug Allergies  Past History:  Past medical, surgical, family and social histories (including risk factors) reviewed for relevance to current acute and chronic problems.  Past Medical History: Reviewed history from 01/12/2009 and no changes required. Coronary artery disease Hypertension Myocardial infarction, hx of x 2 Transient ischemic attack, hx of Hyperlipidemia Allergic rhinitis Atrial fibrillation  Family History: Reviewed history from 02/24/2008 and no changes required. father died 23, and line mother died at 78 of old age had a AAA  4 brothers one died in [place] to died of heart attackstwo sisters one died of an MI.  One died of stroke and MI  Social History: Reviewed history from 10/07/2008 and no changes required. Former Smoker Retired Married 4 daughters Alcohol use-no Drug use-no Regular exercise-yes  Review of Systems      See HPI  Physical Exam  General:   Well-developed,well-nourished,in no acute distress; alert,appropriate and cooperative throughout examination   Impression & Recommendations:  Problem # 1:  ATRIAL FIBRILLATION (ICD-427.31) Assessment Improved  His updated medication list for this problem includes:    Aspirin 81 Mg Tbec (Aspirin) .Marland Kitchen... Take one tablet daily    Digoxin 0.125 Mg Tabs (Digoxin) ..... Once daily    Warfarin Sodium 5 Mg Tabs (Warfarin sodium) .Marland Kitchen... Take as directed by coumadin clinic.    Metoprolol Tartrate 50 Mg Tabs (Metoprolol tartrate) .Marland Kitchen... 1  tab twice daily  Problem # 2:  TRANSIENT ISCHEMIC ATTACK, HX OF (ICD-V12.50) Assessment: Unchanged  Orders: Vascular Clinic (Vascular)  Complete Medication List: 1)  Aspirin 81 Mg Tbec (Aspirin) .... Take one tablet daily 2)  Centrum Silver Tabs (Multiple vitamins-minerals) .... Once daily 3)  Digoxin 0.125 Mg Tabs (Digoxin) .... Once daily 4)  Warfarin Sodium 5 Mg Tabs (Warfarin sodium) .... Take as directed by coumadin clinic. 5)  Caltrate 600+d 600-400 Mg-unit Tabs (Calcium carbonate-vitamin d) .Marland Kitchen.. 1 tab once daily 6)  Methotrexate 2.5 Mg Tabs (Methotrexate sodium) .... Take 4 tabs saturday and sunday 7)  Nitroglycerin 0.4 Mg Subl (Nitroglycerin) .... One tablet under tongue every 5 minutes as needed for chest pain---may repeat times three 8)  Loratadine 10 Mg Tabs (Loratadine) .Marland Kitchen.. 1 tab once daily 9)  Metoprolol Tartrate 50 Mg Tabs (Metoprolol tartrate) .Marland Kitchen.. 1  tab twice daily 10)  Tramadol Hcl 50 Mg Tabs (Tramadol hcl) .... Take 1 tablet 2-3 times daily as needed  11)  Lipitor 40 Mg Tabs (Atorvastatin calcium) .... Take one tab by mouth once daily  Patient Instructions: 1)  call Dr. Jerilee Field...........vascular surgeon for consult regarding the blockage of your carotid artery. 2)  Decrease the beta blocker to 25 mg in the morning continue the evening dose at 50

## 2010-12-26 NOTE — Medication Information (Signed)
Summary: [name] [name]  Anticoagulant Therapy  Managed by: Cloyde Reams, RN, BSN Referring MD: Valera Castle MD PCP: Aundra Dubin, MD Supervising MD: Johney Frame MD, Fayrene Fearing Indication 1: Atrial Fibrillation (ICD-427.31) Lab Used: [facility] Site: [facility] INR POC 2.7 INR RANGE 2 - 3  Dietary changes: no    Health status changes: no    Bleeding/hemorrhagic complications: no    Recent/future hospitalizations: no    Any changes in medication regimen? no    Recent/future dental: no  Any missed doses?: no       Is patient compliant with meds? yes       Allergies (verified): No Known Drug Allergies  Anticoagulation Management History:      The patient is taking warfarin and comes in today for a routine follow up visit.  Positive risk factors for bleeding include an age of 75 years or older.  The bleeding index is 'intermediate risk'.  Positive CHADS2 values include History of HTN and Age > 75 years old.  The start date was 10/18/2007.  His last INR was 3.6.  Anticoagulation responsible provider: Myka Lukins MD, Fayrene Fearing.  INR POC: 2.7.  Cuvette Lot#: [id number].  Exp: 02/2011.    Anticoagulation Management Assessment/Plan:      The patient's current anticoagulation dose is Warfarin sodium 5 mg  tabs: Take as directed by coumadin clinic..  The target INR is 2.0-3.0.  The next INR is due 02/06/2010.  Anticoagulation instructions were given to patient.  Results were reviewed/authorized by Cloyde Reams, RN, BSN.  He was notified by Cloyde Reams RN.         Prior Anticoagulation Instructions: INR 3.0 Today take 2.5mg s then resume 5mg s daily except 2.5mg s of Tuesdays. Recheck in 4 weeks.   Current Anticoagulation Instructions: INR 2.7  Continue on same dosage 5mg  daily except 2.5mg  on Tuesdays.  Recheck in 4 weeks.

## 2010-12-26 NOTE — Assessment & Plan Note (Signed)
Summary: eph/ gd  Medications Added NIASPAN 1000 MG CR-TABS (NIACIN (ANTIHYPERLIPIDEMIC)) 2 TABS AT BEDTIME        Visit Type:  Follow-up Referring Provider:  Valera Castle Primary Provider:  Aundra Dubin, MD  CC:  C/O BLURRED VISION NO CARDIAC COMPLAINTS.  History of Present Illness: [name] [name] returns today after being discharged hospital with an episode of a transient ischemic attack.  We saw him during hospitalization. His INR was therapeutic at the time he is also on antiplatelet therapy with aspirin.  He's had numerous TIAs in the past. 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  Echocardiogram showed normal left ventricular chamber size and function. EF 55% with inferior Peightyn Roberson being not seen well. He did have some mild inferior hypokinesia. There was evidence of mild pulmonary hypertension.  I recommended that we run his INR around 3. Today is a little over 3. He reports no melena or hematochezia. He's had no further symptoms of TIAs and no ischemic heart symptoms.  He has chronic lower stem edema which has not changed. He denies any palpitations or syncope.  I reviewed his laboratory data from hospitalization. His cholesterol was 72, triglycerides of 25, HDL 30, LDL 37. This is on 40 mg of Lipitor and Niaspan 1 g.  Current Medications (verified): 1)  Aspirin 81 Mg  Tbec (Aspirin) .... Take One Tablet Daily 2)  Centrum Silver   Tabs (Multiple Vitamins-Minerals) .... Once Daily 3)  Digoxin 0.125 Mg  Tabs (Digoxin) .... Once Daily 4)  Warfarin Sodium 5 Mg  Tabs (Warfarin Sodium) .... Take As Directed By Coumadin Clinic. 5)  Caltrate 600+d 600-400 Mg-Unit Tabs (Calcium  Carbonate-Vitamin D) .Marland Kitchen.. 1 Tab Once Daily 6)  Methotrexate 2.5 Mg Tabs (Methotrexate Sodium) .... Take 4 Tabs Saturday and Sunday 7)  Nitroglycerin 0.4 Mg Subl (Nitroglycerin) .... One Tablet Under Tongue Every 5 Minutes As Needed For Chest Pain---May Repeat Times Three 8)  Loratadine 10 Mg Tabs (Loratadine) .... 1 Tab Once Daily 9)  Metoprolol Tartrate 50 Mg Tabs (Metoprolol Tartrate) .... 1  Tab Twice Daily 10)  Tramadol Hcl 50 Mg Tabs (Tramadol Hcl) .... Take 1 Tablet 2-3 Times Daily As Needed 11)  Lipitor 40 Mg Tabs (Atorvastatin Calcium) .... Take One Tab By Mouth Once Daily 12)  Niaspan 1000 Mg Cr-Tabs (Niacin (Antihyperlipidemic)) .... 2 Tabs At Bedtime  Allergies: No Known Drug Allergies  Past History:  Past Medical History: Last updated: 01/12/2009 Coronary artery disease Hypertension Myocardial infarction, hx of x 2 Transient ischemic attack, hx of Hyperlipidemia Allergic rhinitis Atrial fibrillation  Family History: Last updated: 02/24/2008 father died 72, and line mother died at 84 of old age had a AAA  4 brothers one died in [place] to died of heart attackstwo sisters one died of an MI.  One died of stroke and MI  Social History: Last updated: 10/07/2008 Former Smoker Retired Married 4 daughters Alcohol use-no Drug use-no Regular exercise-yes  Risk Factors: Exercise: yes (02/24/2008)  Risk Factors: Smoking Status: quit (06/15/2007)  Review of Systems       negative other than history of present illness  Vital Signs:  Patient profile:   77 year old male Height:      72  inches  Weight:      209 pounds BMI:     28.45 Pulse rate:   68 / minute BP sitting:   90 / 60  (left arm) Cuff size:   large  Vitals Entered By: Scherrie Bateman, LPN (Apr 16, 2010 10:14 AM)  Physical Exam  General:  elderly, alert oriented x3, Head:  normocephalic and atraumatic Eyes:  right sub-conjunctival hemorrhage Mouth:  poor dentition.   Neck:  Neck supple, no JVD.  No masses, thyromegaly or abnormal cervical nodes. Chest Tecia Cinnamon:  no deformities or breast masses noted Lungs:  Clear bilaterally to auscultation and percussion. Heart:  PMI nondisplaced, irregular rate and rhythm, variable S1-S2, left carotid bruit Msk:  decreased ROM.   Pulses:  present but reduced the lower extremities Extremities:  1+ left pedal edema and 1+ right pedal edema.   Neurologic:  Alert and oriented x 3. Skin:  Intact without lesions or rashes. Psych:  Normal affect.   Impression & Recommendations:  Problem # 1:  ATRIAL FIBRILLATION (ICD-427.31) Assessment Unchanged  His updated medication list for this problem includes:    Aspirin 81 Mg Tbec (Aspirin) .Marland Kitchen... Take one tablet daily    Digoxin 0.125 Mg Tabs (Digoxin) ..... Once daily    Warfarin Sodium 5 Mg Tabs (Warfarin sodium) .Marland Kitchen... Take as directed by coumadin clinic.    Metoprolol Tartrate 50 Mg Tabs (Metoprolol tartrate) .Marland Kitchen... 1  tab twice daily  His updated medication list for this problem includes:    Aspirin 81 Mg Tbec (Aspirin) .Marland Kitchen... Take one tablet daily    Digoxin 0.125 Mg Tabs (Digoxin) ..... Once daily    Warfarin Sodium 5 Mg Tabs (Warfarin sodium) .Marland Kitchen... Take as directed by coumadin clinic.    Metoprolol Tartrate 50 Mg Tabs (Metoprolol tartrate) .Marland Kitchen... 1  tab twice daily  Problem # 2:  TRANSIENT ISCHEMIC ATTACK, HX OF (ICD-V12.50) Assessment: Improved  Problem # 3:  MYOCARDIAL INFARCTION, HX OF (ICD-412) Assessment: Unchanged  His updated medication list for this problem includes:    Aspirin 81 Mg Tbec (Aspirin) .Marland Kitchen... Take one tablet daily    Warfarin Sodium 5 Mg Tabs (Warfarin sodium) .Marland Kitchen... Take as directed by coumadin clinic.    Nitroglycerin 0.4 Mg Subl (Nitroglycerin) ..... One tablet under tongue every 5 minutes as needed for chest pain---may repeat times three    Metoprolol Tartrate 50 Mg Tabs (Metoprolol tartrate) .Marland Kitchen... 1  tab twice daily  His updated medication list for this problem includes:     Aspirin 81 Mg Tbec (Aspirin) .Marland Kitchen... Take one tablet daily    Warfarin Sodium 5 Mg Tabs (Warfarin sodium) .Marland Kitchen... Take as directed by coumadin clinic.    Nitroglycerin 0.4 Mg Subl (Nitroglycerin) ..... One tablet under tongue every 5 minutes as needed for chest pain---may repeat times three    Metoprolol Tartrate 50 Mg Tabs (Metoprolol tartrate) .Marland Kitchen... 1  tab twice daily  Problem # 4:  HYPERTENSION (ICD-401.9) His systolic blood pressure using right around 100. He is a little low today but asymptomatic. No changes in meds. His updated medication list for this problem includes:    Aspirin 81 Mg Tbec (Aspirin) .Marland Kitchen... Take one tablet daily    Metoprolol Tartrate 50 Mg Tabs (Metoprolol tartrate) .Marland Kitchen... 1  tab twice daily  His updated medication list for this problem includes:    Aspirin 81 Mg Tbec (Aspirin) .Marland Kitchen... Take one tablet daily    Metoprolol Tartrate 50 Mg Tabs (Metoprolol tartrate) .Marland Kitchen... 1  tab twice daily  Problem # 5:  CORONARY ARTERY DISEASE (ICD-414.00) Assessment: Unchanged  His updated medication list for this problem includes:    Aspirin 81 Mg Tbec (Aspirin) .Marland Kitchen... Take one tablet daily    Warfarin Sodium 5 Mg Tabs (Warfarin sodium) .Marland Kitchen... Take as directed by coumadin clinic.    Nitroglycerin 0.4 Mg Subl (Nitroglycerin) ..... One tablet under tongue every 5 minutes as needed for chest pain---may repeat times three    Metoprolol Tartrate 50 Mg Tabs (Metoprolol tartrate) .Marland Kitchen... 1  tab twice daily  His updated medication list for this problem includes:    Aspirin 81 Mg Tbec (Aspirin) .Marland Kitchen... Take one tablet daily    Warfarin Sodium 5 Mg Tabs (Warfarin sodium) .Marland Kitchen... Take as directed by coumadin clinic.    Nitroglycerin 0.4 Mg Subl (Nitroglycerin) ..... One tablet under tongue every 5 minutes as needed for chest pain---may repeat times three    Metoprolol Tartrate 50 Mg Tabs (Metoprolol tartrate) .Marland Kitchen... 1  tab twice daily  Problem # 6:  HYPERLIPIDEMIA (ICD-272.4) Assessment: Improved His   numbers are really low and I will cut back on his Lipitor to 20 mg per day. He will continue the Niaspan. Follow blood work in 6 weeks. His updated medication list for this problem includes:    Lipitor 40 Mg Tabs (Atorvastatin calcium) .Marland Kitchen... Take one tab by mouth once daily    Niaspan 1000 Mg Cr-tabs (Niacin (antihyperlipidemic)) .Marland Kitchen... 2 tabs at bedtime  Patient Instructions: 1)  Your physician recommends that you schedule a follow-up appointment in: 6 MONTHS WITH DR Miliano Cotten 2)  Your physician recommends that you return for lab work in: FASTING BMET DIG LIPID LIVER  272.4 V58.69 WEEK OF JULY 5TH 3)  Your physician has recommended you make the following change in your medication: DECREASE LIPITOR TO 20 MG EVERY DAY

## 2010-12-26 NOTE — Medication Information (Signed)
Summary: ccr  Anticoagulant Therapy  Managed by: Weston Brass, PharmD Referring MD: Valera Castle MD PCP: Aundra Dubin, MD Supervising MD: Riley Kill MD, Maisie Fus Indication 1: Atrial Fibrillation (ICD-427.31) Indication 2: Transcient Ischemic Attack Lab Used: [facility] Site: [facility] INR POC 2.6 INR RANGE 2 - 3  Dietary changes: no    Health status changes: no    Bleeding/hemorrhagic complications: no    Recent/future hospitalizations: no    Any changes in medication regimen? no    Recent/future dental: yes     Details: Having oral surgery tomorrow.  Any missed doses?: no       Is patient compliant with meds? yes       Allergies: No Known Drug Allergies  Anticoagulation Management History:      The patient is taking warfarin and comes in today for a routine follow up visit.  Positive risk factors for bleeding include an age of 75 years or older.  The bleeding index is 'intermediate risk'.  Positive CHADS2 values include History of HTN and Age > 37 years old.  The start date was 10/18/2007.  His last INR was 3.6.  Anticoagulation responsible provider: Riley Kill MD, Maisie Fus.  INR POC: 2.6.  Cuvette Lot#: [id number].  Exp: 08/2011.    Anticoagulation Management Assessment/Plan:      The patient's current anticoagulation dose is Warfarin sodium 5 mg  tabs: Take as directed by coumadin clinic..  The target INR is 2.0-3.0.  The next INR is due 08/19/2010.  Anticoagulation instructions were given to patient.  Results were reviewed/authorized by Weston Brass, PharmD.  He was notified by Liana Gerold, PharmD Candidate.         Prior Anticoagulation Instructions: INR 2.6  Continue taking Coumadin 1 tab (5 mg) on Sun, Tues, Thur, Sat and Coumadin 0.5 tab (2.5 mg) on Mon, Wed, Fri. Return to clinic in 3 weeks.    Current Anticoagulation Instructions: INR 2.6  Continue 1 tablet daily except 1/2 tablet Mon, Wed and Fri.  Return to clinic in 4 weeks.

## 2010-12-30 ENCOUNTER — Encounter (INDEPENDENT_AMBULATORY_CARE_PROVIDER_SITE_OTHER): Payer: Medicare Other

## 2010-12-30 ENCOUNTER — Encounter: Payer: Self-pay | Admitting: Cardiology

## 2010-12-30 DIAGNOSIS — I4891 Unspecified atrial fibrillation: Secondary | ICD-10-CM

## 2010-12-30 DIAGNOSIS — Z7901 Long term (current) use of anticoagulants: Secondary | ICD-10-CM

## 2011-01-08 ENCOUNTER — Encounter: Payer: Self-pay | Admitting: Family Medicine

## 2011-01-08 ENCOUNTER — Ambulatory Visit (INDEPENDENT_AMBULATORY_CARE_PROVIDER_SITE_OTHER): Payer: Medicare Other | Admitting: Family Medicine

## 2011-01-08 VITALS — BP 120/68 | Temp 98.0°F | Ht 71.25 in | Wt 204.0 lb

## 2011-01-08 DIAGNOSIS — J069 Acute upper respiratory infection, unspecified: Secondary | ICD-10-CM

## 2011-01-08 MED ORDER — HYDROCODONE-HOMATROPINE 5-1.5 MG/5ML PO SYRP: 2.5000 mL | ORAL_SOLUTION | Freq: Four times a day (QID) | ORAL | Status: AC | PRN

## 2011-01-08 NOTE — Patient Instructions (Signed)
Hydromet one half teaspoons 3 to 4 times a day as needed for cough.  Drink lots of water.  Vaporizer in your bedroom at night

## 2011-01-08 NOTE — Progress Notes (Signed)
  Subjective:    Patient ID: [name] [name], male    DOB: September 16, 1925, 77 y.o.   MRN: [id number]  HPI Billey Gosling is an 77 year old, married male, nonsmoker, who comes in with a 3-day history of a cold manifested by head congestion, postnasal drip, and nonproductive cough.  Present no fever, earache, et Karie Soda.   Review of Systems Negative    Objective:   Physical Exam    Well-developed well-nourished, male in no acute distress.  Examination of the HEENT were negative.  Neck was supple.  No adenopathy.  Lungs are clear    Assessment & Plan:  Viral syndrome.  Plan lots of liquids, Hydromet one half to 1 teaspoon at bedtime p.r.n. For cough

## 2011-01-09 NOTE — Medication Information (Signed)
Summary: Coumadin Clinic  Anticoagulant Therapy  Managed by: Bethena Midget, RN, BSN Referring MD: Valera Castle MD PCP: Aundra Dubin, MD Supervising MD: Jens Som MD, Arlys John Indication 1: Atrial Fibrillation (ICD-427.31) Indication 2: Transcient Ischemic Attack Lab Used: [facility]  Site: [facility] INR POC 2.9 INR RANGE 2 - 3  Dietary changes: no    Health status changes: no    Bleeding/hemorrhagic complications: no    Recent/future hospitalizations: no    Any changes in medication regimen? no    Recent/future dental: no  Any missed doses?: no       Is patient compliant with meds? yes       Allergies: No Known Drug Allergies  Anticoagulation Management History:      The patient is taking warfarin and comes in today for a routine follow up visit.  Positive risk factors for bleeding include an age of 50 years or older.  The bleeding index is 'intermediate risk'.  Positive CHADS2 values include History of HTN and Age > 77 years old.  The start date was 10/18/2007.  His last INR was 3.6.  Anticoagulation responsible provider: Jens Som MD, Arlys John.  INR POC: 2.9.  Cuvette Lot#: [id number].  Exp: 12/2011.    Anticoagulation Management Assessment/Plan:      The patient's current anticoagulation dose is Warfarin sodium 5 mg  tabs: Take as directed by coumadin clinic..  The target INR is 2.0-3.0.  The next INR is due 01/27/2011.  Anticoagulation instructions were given to patient.  Results were reviewed/authorized by Bethena Midget, RN, BSN.  He was notified by Bethena Midget, RN, BSN.         Prior Anticoagulation Instructions: INR 1.8  Take 1 tablet today, then start taking 1 tablet daily except 1/2 tablet on Mondays and Fridays.  Recheck in 3 weeks.     Current Anticoagulation Instructions: INR 2.9 Continue 5mg s everyday except 2.5mg s on Mondays and Fridays. Recheck in 4 weeks.

## 2011-01-13 DIAGNOSIS — Z8679 Personal history of other diseases of the circulatory system: Secondary | ICD-10-CM

## 2011-01-13 DIAGNOSIS — I4891 Unspecified atrial fibrillation: Secondary | ICD-10-CM

## 2011-01-23 ENCOUNTER — Encounter: Payer: Self-pay | Admitting: Cardiology

## 2011-01-27 ENCOUNTER — Encounter (INDEPENDENT_AMBULATORY_CARE_PROVIDER_SITE_OTHER): Payer: Medicare Other

## 2011-01-27 ENCOUNTER — Encounter: Payer: Self-pay | Admitting: Cardiology

## 2011-01-27 ENCOUNTER — Telehealth: Payer: Self-pay | Admitting: Cardiology

## 2011-01-27 DIAGNOSIS — I4891 Unspecified atrial fibrillation: Secondary | ICD-10-CM

## 2011-01-27 DIAGNOSIS — Z7901 Long term (current) use of anticoagulants: Secondary | ICD-10-CM

## 2011-02-04 NOTE — Medication Information (Signed)
Summary: rov/tm  Anticoagulant Therapy  Managed by: Cloyde Reams, RN, BSN Referring MD: Valera Castle MD PCP: Aundra Dubin, MD Supervising MD: Daleen Squibb MD, Maisie Fus Indication 1: Atrial Fibrillation (ICD-427.31) Indication 2: Transcient Ischemic Attack Lab Used: [facility] [place] Site: [facility] INR POC 3.1 INR RANGE 2 - 3  Dietary changes: no    Health status changes: no    Bleeding/hemorrhagic complications: no    Recent/future hospitalizations: no    Any changes in medication regimen? yes       Details: Started on Prednisone taperstarted on Friday 01/24/11.   Recent/future dental: no  Any missed doses?: no       Is patient compliant with meds? yes       Allergies: No Known Drug Allergies  Anticoagulation Management History:      The patient is taking warfarin and comes in today for a routine follow up visit.  Positive risk factors for bleeding include an age of 75 years or older.  The bleeding index is 'intermediate risk'.  Positive CHADS2 values include History of HTN and Age > 23 years old.  The start date was 10/18/2007.  His last INR was 3.6.  Anticoagulation responsible provider: Daleen Squibb MD, Maisie Fus.  INR POC: 3.1.  Cuvette Lot#: [id number].  Exp: 11/2011.    Anticoagulation Management Assessment/Plan:      The patient's current anticoagulation dose is Warfarin sodium 5 mg  tabs: Take as directed by coumadin clinic..  The target INR is 2.0-3.0.  The next INR is due 02/24/2011.  Anticoagulation instructions were given to patient.  Results were reviewed/authorized by Cloyde Reams, RN, BSN.  He was notified by Cloyde Reams RN.         Prior Anticoagulation Instructions: INR 2.9 Continue 5mg s everyday except 2.5mg s on Mondays and Fridays. Recheck in 4 weeks.   Current Anticoagulation Instructions: INR 3.1  Skip today's dosage of Coumadin, then resume same dosage 1 tablet daily except 1/2 tablet on Mondays and Fridays.  Recheck in 4 weeks.

## 2011-02-04 NOTE — Progress Notes (Signed)
Summary: NTG refill  Phone Note Refill Request Message from:  Patient on January 27, 2011 11:00 AM  Refills Requested: Medication #1:  NITROGLYCERIN 0.4 MG SUBL One tablet under tongue every 5 minutes as needed for chest pain---may repeat times three   Dosage confirmed as above?Dosage Confirmed   Notes: [facility] Initial call taken by: Cloyde Reams RN,  January 27, 2011 11:00 AM

## 2011-02-11 LAB — PROTIME-INR
INR: 1.95 — ABNORMAL HIGH (ref 0.00–1.49)
INR: 2.16 — ABNORMAL HIGH (ref 0.00–1.49)
INR: 2.36 — ABNORMAL HIGH (ref 0.00–1.49)
Prothrombin Time: 23.9 seconds — ABNORMAL HIGH (ref 11.6–15.2)
Prothrombin Time: 25.6 seconds — ABNORMAL HIGH (ref 11.6–15.2)

## 2011-02-11 LAB — POCT I-STAT, CHEM 8
BUN: 14 mg/dL (ref 6–23)
Calcium, Ion: 1.12 mmol/L (ref 1.12–1.32)
Glucose, Bld: 108 mg/dL — ABNORMAL HIGH (ref 70–99)
TCO2: 29 mmol/L (ref 0–100)

## 2011-02-11 LAB — POCT CARDIAC MARKERS: CKMB, poc: 5 ng/mL (ref 1.0–8.0)

## 2011-02-11 LAB — FOLATE: Folate: 10.1 ng/mL

## 2011-02-11 LAB — HEMOGLOBIN A1C
Hgb A1c MFr Bld: 5.9 % — ABNORMAL HIGH (ref <5.7)
Mean Plasma Glucose: 123 mg/dL — ABNORMAL HIGH (ref <117)

## 2011-02-11 LAB — CBC
MCHC: 33.8 g/dL (ref 30.0–36.0)
MCV: 102.2 fL — ABNORMAL HIGH (ref 78.0–100.0)
RDW: 16.8 % — ABNORMAL HIGH (ref 11.5–15.5)

## 2011-02-11 LAB — COMPREHENSIVE METABOLIC PANEL
AST: 50 U/L — ABNORMAL HIGH (ref 0–37)
Albumin: 3.6 g/dL (ref 3.5–5.2)
Alkaline Phosphatase: 56 U/L (ref 39–117)
BUN: 12 mg/dL (ref 6–23)
CO2: 30 mEq/L (ref 19–32)
Chloride: 102 mEq/L (ref 96–112)
Creatinine, Ser: 1.07 mg/dL (ref 0.4–1.5)
GFR calc non Af Amer: >60 mL/min (ref >60)
Potassium: 3.9 mEq/L (ref 3.5–5.1)
Total Bilirubin: 0.9 mg/dL (ref 0.3–1.2)

## 2011-02-11 LAB — DIFFERENTIAL
Basophils Absolute: 0 10*3/uL (ref 0.0–0.1)
Basophils Relative: 0 % (ref 0–1)
Eosinophils Absolute: 0.1 10*3/uL (ref 0.0–0.7)
Monocytes Absolute: 0.8 10*3/uL (ref 0.1–1.0)
Neutro Abs: 6.2 10*3/uL (ref 1.7–7.7)
Neutrophils Relative %: 79 % — ABNORMAL HIGH (ref 43–77)

## 2011-02-11 LAB — LIPID PANEL: VLDL: 5 mg/dL (ref 0–40)

## 2011-02-11 LAB — URINALYSIS, ROUTINE W REFLEX MICROSCOPIC
Bilirubin Urine: NEGATIVE
Glucose, UA: NEGATIVE mg/dL
Hgb urine dipstick: NEGATIVE
Specific Gravity, Urine: 1.008 (ref 1.005–1.030)
Urobilinogen, UA: 0.2 mg/dL (ref 0.0–1.0)

## 2011-02-11 LAB — DIGOXIN LEVEL: Digoxin Level: 0.3 ng/mL — ABNORMAL LOW (ref 0.8–2.0)

## 2011-02-11 LAB — VITAMIN B12: Vitamin B-12: 483 pg/mL (ref 211–911)

## 2011-02-11 NOTE — Letter (Signed)
Summary: Dr. Rutherford Guys Truslow's Office Note  Dr. Rutherford Guys Truslow's Office Note   Imported By: Marylou Mccoy 02/05/2011 11:36:08  _____________________________________________________________________  External Attachment:    Type:   Image     Comment:   External Document

## 2011-02-24 ENCOUNTER — Ambulatory Visit (INDEPENDENT_AMBULATORY_CARE_PROVIDER_SITE_OTHER): Payer: Medicare Other | Admitting: *Deleted

## 2011-02-24 DIAGNOSIS — I4891 Unspecified atrial fibrillation: Secondary | ICD-10-CM

## 2011-02-24 DIAGNOSIS — Z8679 Personal history of other diseases of the circulatory system: Secondary | ICD-10-CM

## 2011-02-24 DIAGNOSIS — Z7901 Long term (current) use of anticoagulants: Secondary | ICD-10-CM | POA: Insufficient documentation

## 2011-02-24 LAB — POCT INR: INR: 2.8

## 2011-02-24 MED ORDER — NITROGLYCERIN 0.4 MG SL SUBL: 0.4000 mg | SUBLINGUAL_TABLET | SUBLINGUAL | Status: DC | PRN

## 2011-02-24 NOTE — Patient Instructions (Signed)
Continue on same dosage 1 tablet daily except 1/2 tablet on Mondays and Fridays.  Recheck in 4 weeks 

## 2011-03-11 LAB — BASIC METABOLIC PANEL
CO2: 33 mEq/L — ABNORMAL HIGH (ref 19–32)
Calcium: 9 mg/dL (ref 8.4–10.5)
Creatinine, Ser: 0.9 mg/dL (ref 0.4–1.5)
GFR calc Af Amer: >60 mL/min (ref >60)
GFR calc non Af Amer: >60 mL/min (ref >60)
Sodium: 138 mEq/L (ref 135–145)

## 2011-03-11 LAB — DIGOXIN LEVEL: Digoxin Level: 0.5 ng/mL — ABNORMAL LOW (ref 0.8–2.0)

## 2011-03-11 LAB — CBC
Hemoglobin: 13.5 g/dL (ref 13.0–17.0)
MCHC: 33.6 g/dL (ref 30.0–36.0)
RBC: 4.05 MIL/uL — ABNORMAL LOW (ref 4.22–5.81)
WBC: 16.1 10*3/uL — ABNORMAL HIGH (ref 4.0–10.5)

## 2011-03-11 LAB — URINALYSIS, ROUTINE W REFLEX MICROSCOPIC
Bilirubin Urine: NEGATIVE
Hgb urine dipstick: NEGATIVE
Nitrite: NEGATIVE
Protein, ur: NEGATIVE mg/dL
Urobilinogen, UA: 0.2 mg/dL (ref 0.0–1.0)

## 2011-03-11 LAB — DIFFERENTIAL
Basophils Relative: 0 % (ref 0–1)
Eosinophils Absolute: 0.2 10*3/uL (ref 0.0–0.7)
Lymphocytes Relative: 8 % — ABNORMAL LOW (ref 12–46)
Monocytes Relative: 9 % (ref 3–12)
Neutrophils Relative %: 82 % — ABNORMAL HIGH (ref 43–77)

## 2011-03-11 LAB — POCT CARDIAC MARKERS

## 2011-03-11 LAB — PROTIME-INR
INR: 1.8 — ABNORMAL HIGH (ref 0.00–1.49)
Prothrombin Time: 21.4 seconds — ABNORMAL HIGH (ref 11.6–15.2)

## 2011-03-24 ENCOUNTER — Ambulatory Visit (INDEPENDENT_AMBULATORY_CARE_PROVIDER_SITE_OTHER): Payer: Medicare Other | Admitting: *Deleted

## 2011-03-24 DIAGNOSIS — I4891 Unspecified atrial fibrillation: Secondary | ICD-10-CM

## 2011-03-24 DIAGNOSIS — Z8679 Personal history of other diseases of the circulatory system: Secondary | ICD-10-CM

## 2011-03-24 LAB — POCT INR: INR: 3

## 2011-04-08 ENCOUNTER — Other Ambulatory Visit: Payer: Self-pay | Admitting: Family Medicine

## 2011-04-08 ENCOUNTER — Other Ambulatory Visit (INDEPENDENT_AMBULATORY_CARE_PROVIDER_SITE_OTHER): Payer: Medicare Other

## 2011-04-08 DIAGNOSIS — E7800 Pure hypercholesterolemia, unspecified: Secondary | ICD-10-CM

## 2011-04-08 DIAGNOSIS — Z79899 Other long term (current) drug therapy: Secondary | ICD-10-CM

## 2011-04-08 LAB — LIPID PANEL
Cholesterol: 107 mg/dL (ref 0–200)
HDL: 48.4 mg/dL (ref >39.00)
LDL Cholesterol: 51 mg/dL (ref 0–99)
VLDL: 7.8 mg/dL (ref 0.0–40.0)

## 2011-04-08 LAB — HEPATIC FUNCTION PANEL
ALT: 22 U/L (ref 0–53)
Albumin: 3.3 g/dL — ABNORMAL LOW (ref 3.5–5.2)
Total Protein: 5.6 g/dL — ABNORMAL LOW (ref 6.0–8.3)

## 2011-04-08 NOTE — Assessment & Plan Note (Signed)
[facility]                            CARDIOLOGY OFFICE NOTE   NAME:[name] [name], [name] [name]                      MRN:          [id number]  DATE:01/01/2009                            DOB:          May 21, 1925    Mr. Hoare comes in today for assessment of a possible TIA.   Last evening at 5 o'clock, he was driving down the road with his wife.  He had bilateral blurred vision.  He had no other neurological symptoms.   He went to the [facility].  There, his INR was 1.8.  Of  note, he is on the Rocket study drug, so we do not know if he is taking  warfarin or study drug.  CT scan showed an old right frontal stroke  which he knew about, but no acute findings.  He had some stable cerebral  atrophy, peri-ventricular white matter disease.  His other blood work  was unremarkable.   He was told to follow up with [place] today.   He has had no symptoms since yesterday.  I have gone over complete  neurological review of systems and can find nothing else that  accompanied this bilateral visual blurring.   He has a history of chronic atrial fib.  He has had coronary disease in  the past with sustained left ventricular function.  His last EF was in  the 50-55% range.  He has a history of previous TIA and stroke.  He has  had hypertension, hyperlipidemia, and bradycardia in the past as well.   PHYSICAL EXAMINATION:  GENERAL:  Today, he is in no acute distress.  He  is alert and oriented x3.  VITAL SIGNS:  Blood pressure is 90/60, 77 is his heart rate, his weight  is 207.  HEENT:  Unremarkable.  Facial symmetry is normal.  NECK:  Supple.  Carotid upstrokes are equal bilaterally without bruits,  no JVD.  Thyroid is not enlarged.  Trachea is midline.  LUNGS:  Clear to auscultation and percussion.  HEART:  Regular rate and rhythm.  No gallop.  ABDOMEN:  Soft.  EXTREMITIES:  No cyanosis, clubbing, or edema.  No sign of DVT.  Pulses  were present, but reduced.  NEUROLOGIC:  Gait is normal.  Walking in and out of the Clinic.  The  rest of his neuro exam is nonfocal.   ASSESSMENT:  Possible transient ischemic attack, though bilateral  blurring of vision without any other neurological symptoms is unusual.  However, with chronic atrial fib and INR of 1.8, we have to assume that  it was embolic in nature.  He is clearly at high risk of having another  embolic event.   I have spoken to the research study nurses.  We will place him on non-  blended warfarin with an INR goal of 2-3.  I will see him back in 2-3  weeks for careful followup.  He knows to report back to emergency room  if he has any similar symptoms or any associated neurological symptoms.     Thomas C. Daleen Squibb, MD, [facility]  Electronically Signed  TCW/MedQ  DD: 01/01/2009  DT: 01/02/2009  Job #: [id number]

## 2011-04-08 NOTE — Assessment & Plan Note (Signed)
[facility]                            CARDIOLOGY OFFICE NOTE   NAME:JONESValor, Quaintance                      MRN:          [id number]  DATE:10/28/2007                            DOB:          [birth year]    PRIMARY CARDIOLOGIST:  Jesse Sans. Daleen Squibb, MD, [facility]   PRIMARY CARE PHYSICIAN:  Aundra Dubin, M.D.   PATIENT PROFILE:  An 77 year old Caucasian male with prior history of  CAD and chronic atrial fibrillation who presents today for RELY study.   PROBLEMS:  1. Coronary artery disease.      a.     Status post myocardial infarction in 1991 complicated by       atrial fibrillation.      b.     Status post subendocardial myocardial infarction in 1994.       Had catheterization at that time showing an occluded ramus       intermedius and otherwise nonobstructive disease.  EF was normal.      c.     January 2005, Myoview scan showing ischemia.      d.     In 2007, echocardiogram showing EF 50-55% with chronic       atrial fibrillation.  Enrolled in the RELY study.  2. History of transient ischemic attack.  3. Hypertension.  4. Hyperlipidemia.  5. Bradycardia noted today.   HISTORY OF PRESENT ILLNESS:  An 77 year old Caucasian male with the  above problem list.  He was last seen by Dr. Daleen Squibb in the clinic on  September 18.  He presented today fro RELY study follow up.  Today is  his last day in the study, and we will be starting Coumadin this evening  with Coumadin clinic follow up arranged.  Overall, he has tolerated the  RELY study drug well and actually is not looking forward to coming off  of it as he enjoyed not having to be followed in the Coumadin clinic.  I  noted today on his ECG that his heart rate was 47 in atrial fibrillation  and pulse check it was 46.  He denies any chest pain, shortness of  breath, PND, orthopnea, dizziness, syncope.  He does have mild lower  extremity edema.  He was not aware that his hear rate has been running  low.   He said that he sometimes checks his blood pressure and heart rate  at home but has not done this in awhile.  Notably, his heart rate last  evaluation was 68 and prior ECGs from the office here show heart rates  generally in the 70s.   ALLERGIES:  No known drug allergies.   MEDICATIONS:  1. Metoprolol 100 mg b.i.d.  2. Altace 10 mg daily.  3. Niaspan 100 mg two tablets q.h.s.  4. Lipitor 80 mg daily.  5. Coumadin 5 mg as directed, to be started today.  6. Dabigatran (RELY study drug) 110 mg b.i.d. to be stopped today.  7. Digoxin 250 mcg daily.  8. Aspirin 81 mg daily.  9. Multivitamin daily.  10.Calcium Plus D daily.  11.Prednisone 5 mg daily.  12.Methotrexate 2.5 mg five tablets Saturday and five tablets Sunday.   PHYSICAL EXAMINATION:  VITAL SIGNS:  Blood pressure 104/60, heart rate  46, respirations 16, weight 218 pounds, down 6 pounds from his last  visit.  GENERAL:  Pleasant white male in no acute distress, awake, alert and  oriented x3.  NECK:  No bruits or JVD.  LUNGS:  Respirations regular and unlabored.  Clear to auscultation.  CARDIAC:  Irregularly irregular.  Bradycardic.  S1, S2.  No S3, S4,  murmurs.  ABDOMEN:  Round, soft, nontender, nondistended.  Bowel sounds present.  EXTREMITIES:  All four extremities warm, dry, pink with 1-2+ bilateral  lower extremity edema above the sock line.   ACCESSORY CLINICAL FINDINGS:  B-met and digoxin level pending.   ASSESSMENT/PLAN:  1. Atrial fibrillation.  The patient is currently on metoprolol 100      b.i.d. as well as digoxin 250 mcg daily.  With his heart rate in      the 40s, I have recommended that he decrease his digoxin to 125 mcg      daily, and I have provided him with a prescription for this.  We      will also go ahead and check a B-met to evaluate his renal function      as well as a digoxin level.  I have asked him to follow up his      heart rate at home, and we will arrange follow up with Dr. Daleen Squibb in       2-3 weeks.  The patient has finished his RELY study and will resume      Coumadin, to be followed in Coumadin clinic.  2. Coronary artery disease.  The patient offers no complaints.  He      remains on beta blocker ACE inhibitor, statin and aspirin therapy.  3. Hypertension, stable.  4. Hyperlipidemia.  He is followed in the lipid clinic and remains on      statin and Niaspan therapy.   DISPOSITION:  Decrease digoxin as above and check lab work.  Follow up  with Dr. Daleen Squibb in 2-3 weeks.     Nicolasa Ducking, ANP  Electronically Signed      Noralyn Pick. Eden Emms, MD, [facility]  Electronically Signed   CB/MedQ  DD: 10/28/2007  DT: 10/28/2007  Job #: [id number]

## 2011-04-08 NOTE — Assessment & Plan Note (Signed)
[facility]                            CARDIOLOGY OFFICE NOTE   NAME:[name], [name] PLOTTS                      MRN:          [id number]  DATE:11/22/2007                            DOB:          29-Mar-1925    [name] [name] returns today for atrial fibrillation and bradycardia.   Please see the note on October 28, 2007 when he returned for the  termination of the RELY study.   He also has coronary disease well outlined in the chart, normal left  ventricular systolic function, history of transient ischemic attack,  history of anticoagulation prior to the RELY study, hypertension,  hyperlipidemia, and bradycardia that  was noted on October 28, 2007.   He is relatively asymptomatic.   His digoxin level was checked.  It was 0.7.  His chem-7 was normal  except for an elevated CO2 34.  His digoxin was decreased from 0.25 to  0.125, and his warfarin was reinitiated.  He is on metoprolol 100 b.i.d.  Has a narrow complex on his EKG.   He offers no complaints today.  He is very active.   PHYSICAL EXAMINATION:  VITAL SIGNS:  Blood pressure today is 99/68,  pulse is 64 and irregular, was 46 on October 28, 2007, weight 221 up 3.  GENERAL:  He is in no acute distress.  Rest of the exam is unchanged.   I have had a nice talk with [name] today.  I think no other  adjustments need to be made at present.  If he slows further in the  future we can cut back on his metoprolol.  Hopefully he will never need  a permanent pacer, but he was informed that that may be a possibility  down the road.  He is back on warfarin, and that transition has been  smooth in our Coumadin Clinic.  We renewed his Lipitor by [facility].  Will see him back again in six months.     Thomas C. Daleen Squibb, MD, [facility]  Electronically Signed    TCW/MedQ  DD: 11/22/2007  DT: 11/22/2007  Job #: [id number]

## 2011-04-08 NOTE — Assessment & Plan Note (Signed)
[facility]                            CARDIOLOGY OFFICE NOTE   NAME:JONESAntowan, Samford                      MRN:          [id number]  DATE:02/02/2009                            DOB:          [birth year]    HISTORY OF PRESENT ILLNESS:  Mr. Speyer comes in for close followup  today.  I was very concerned on his last visit, about him having  bilateral visual blurring and the possibility though it will be fairly  remote of an embolic TIA.  We took him off his study drug protocol and  now he is on unblinded Coumadin.  He is for a protime today.   He has had no further events.   MEDICATIONS:  All his medications are reviewed and have been placed in  EMR.   PHYSICAL EXAMINATION:  His vital signs are stable.  He has no changes in  his physical examination.   ASSESSMENT/PLAN:  Mr. Strahm is doing well.  He has had no further  neurological symptoms.  He is on unblinded Coumadin.  He will go by the  Coumadin Clinic today to have an INR checked.  I will see him back again  in 6 months.     Thomas C. Daleen Squibb, MD, [facility]  Electronically Signed    TCW/MedQ  DD: 02/02/2009  DT: 02/03/2009  Job #: [id number]

## 2011-04-08 NOTE — Assessment & Plan Note (Signed)
[place] [facility]                            CARDIOLOGY OFFICE NOTE   NAME:[name], [name] HOSEA                      MRN:          [id number]  DATE:08/12/2007                            DOB:          10-29-1925    [name] [name] comes today to establish me as his cardiologist.  He has been  a former patient of [name] [name].  [name] [name] saw him last on Apr 06, 2007.   PROBLEM LIST:  1. Coronary artery disease.  He had initial myocardial infarction in      1991, complicated by ventricular fibrillation.  He had a      subendocardial infarct in 1994.  Cardiac catheterization in 1994      showed nonobstructive disease except for a total intermediate      branch.  He had overall normal left ventricular function.      Echocardiogram a year ago showed an EF of 50-55%.  Most recent      Myoview January 2005, no ischemia.  2. History of chronic atrial fibrillation.  He is on the RELY study.      He has had a history of TIAs in the past.  He has had no problems      recently.  3. Hyperlipidemia.  4. Hypertension.   He has done well since he saw [name] [name] in May.  He has no orthopnea,  PND, chest pain, presyncope, syncope, tachy-palpitations.  He does have  some lower extremity edema which is chronic.   MEDICATIONS:  Listed in the maintenance medications and also Dr.  Blossom Hoops last note and are unchanged except now he is on methotrexate  for arthritis.  This is being prescribed by Dr. Kellie Simmering.   EXAM:  He is very pleasant, looks younger than stated age.  His blood pressure is 99/59 which is basically baseline for him.  His  heart rate is 68.  EKG confirms atrial fibrillation with a well  controlled ventricular rate.  There has been no change.  Weight is 224  up 10.  HEENT:  Normocephalic, atraumatic, PERRLA, extraocular movements intact.  Sclerae are clear.  Facial symmetry is normal.  Carotid upstrokes are equal bilaterally without bruits.  There is no  JVD.   Thyroid is not enlarged.  Trachea is midline.  LUNGS:  Clear.  HEART:  Nondisplaced PMI.  He has a variable rate and rhythm, no gallop.  ABDOMEN:  Soft, good bowel sounds.  EXTREMITIES:  1-2+ pitting edema which he says is stable.  Pulses are  intact.  NEUROLOGIC:  Intact.  SKIN:  A few ecchymoses.   I looked back through the chart, and he actually had a stress Myoview  Apr 14, 2007.  This showed the study not to be gated, so no EF.  There  was no significant ischemia.   ASSESSMENT AND PLAN:  [name] [name] is doing well.  He is on an excellent  medical program per [name] [name].  I have made no changes.  We will see  him back again in 6 months.     Thomas C. Wall, MD,  [facility]  Electronically Signed    TCW/MedQ  DD: 08/12/2007  DT: 08/12/2007  Job #: [id number]   cc:   [name] Dubin, MD

## 2011-04-08 NOTE — Assessment & Plan Note (Signed)
[facility]                               LIPID CLINIC NOTE   NAME:[name] [name], Altergott                      MRN:          [id number]  DATE:09/28/2008                            DOB:          01-06-1925    The patient is seen in the Lipid Clinic for further evaluation and  medication titration associated with his hyperlipidemia in the setting  of documented coronary artery disease.  Mr. Denherder has been feeling and  doing well overall with his Lipitor and Niaspan combination therapy.  He  states that he continues to eat out frequently.  He usually has steak as  a major protein one time each week, chicken is a staple.  He describes  his diet most adequately as a seafood diet.  He includes potatoes, yams,  soup, sandwiches.  Exercise has been limited, the patient has a  stationary bicycle but he has not been working with it due to his  arthritis.   PAST MEDICAL HISTORY:  Pertinent for coronary artery disease, history of  TIA, hypertension, and hyperlipidemia.   CURRENT MEDICATIONS:  1. Metoprolol.  2. Ramipril.  3. Niaspan 2 g at bedtime.  4. Lipitor 80 mg daily.  5. Warfarin versus rivaroxaban in the RELY study.  6. Digoxin 0.125 mg daily.  7. Aspirin 81 mg daily.  8. Calcium with vitamin D daily.  9. Darvocet as needed.  10.Methotrexate.  11.Loratadine.   PHYSICAL EXAMINATION:  Weight today is 218 and quarter pounds, blood  pressure is 122/68, and heart rate is 58.   LABORATORY DATA:  Labs on September 28, 2008, revealed total cholesterol  97, triglycerides 76, HDL 36.7, LDL 45, AST slightly elevated at 41, and  ALT 34.   ASSESSMENT:  The patient has excellent numbers today and is maintaining  compliance with his therapies.  He will continue with his therapies at  this time.  We will follow up on his AST in about 3 months to verify all  is well.  He will work to decrease his carbohydrate and increase his  bicycle times.  We will see him back in 6  months.  I appreciate the  opportunity to see this pleasant patient.  Saundra Shelling participated in  the visit.      Shelby Dubin, PharmD, BCPS, CPP  Electronically Signed      Rollene Rotunda, MD, [facility]  Electronically Signed   MP/MedQ  DD: 10/05/2008  DT: 10/06/2008  Job #: [id number]   cc:   Everardo Beals. Juanda Chance, MD, [facility]

## 2011-04-08 NOTE — Assessment & Plan Note (Signed)
[facility]                               LIPID CLINIC NOTE   NAME:JONESKerman, Pfost                      MRN:          [id number]  DATE:09/30/2007                            DOB:          1925-06-09    Mr. Ebeling comes in today for evaluation of his hyperlipidemia therapy.   Current medications for cholesterol management include:  1. Lipitor 80 mg daily.  2. Niaspan 2 gm at bedtime.   He has been compliant with these therapies, and is tolerating them just  fine with no muscle aches or pains, or any flushing with Niaspan to  report.  His other medications have not changed, and they include  metoprolol, Altace, Rely Study, dabegatran, digoxin, aspirin,  multivitamin, calcium with D, prednisone, and methotrexate.   PHYSICAL EXAMINATION:  Includes a weight os 220 pounds.  Blood pressure  115/60.  Heart rate is 63.   LABORATORY DATA:  Includes total cholesterol of 96, triglycerides 90,  HDL 31.7, LDL 46.  Liver function studies were within normal limits.   ASSESSMENT:  Mr. Boy' triglycerides and LDL are at goal, however, his  HDL remains below the goal of greater than 40.  He tries to walk  occasionally, but mainly he is riding a stationary bike in the house for  2 to 3 miles at a time, but admits that both walking and stationary bike  were not consistently done.  He continues to try to follow a heart-  healthy diet along with his wife, who is also on cholesterol medicines.  He eat chicken and seafood a lot, broiled, not fried.  He limits red  meat to once per week.  Lots of fresh vegetables and fruit.   PLAN:  Mr. Foglio continues to be reluctant to add another medication, as  his Lipitor and Niaspan are currently at the maximum.  He is motivated  to continue to make improvements in his diet and exercise program to  help get the HDL up above 40.  We are going to follow up with him in 6  months with a lipid and liver panel, and see if any medication  additions  or exchanges  need to be made at that time.  He was instructed to call us with any  problems, concerns, or questions in the meantime.     Charolotte Eke, PharmD  Electronically Signed      Rollene Rotunda, MD, [facility]  Electronically Signed   TP/MedQ  DD: 09/30/2007  DT: 09/30/2007  Job #: [id number]

## 2011-04-08 NOTE — Assessment & Plan Note (Signed)
[facility]                               LIPID CLINIC NOTE   NAME:[name] [name], [name] [name]                      MRN:          [id number]  DATE:03/16/2008                            DOB:          04/11/1925    [name] [name] comes in today with his wife for management of his  hyperlipidemia therapy, which currently includes Lipitor 80 mg daily,  Niaspan 2 gm daily at bedtime.  He has been compliant with these  medications and is tolerating them just fine with no flushing actions or  new muscle aches and pains.  His other medications were reviewed and  have not changed since we saw him in November, 2008.   PHYSICAL EXAMINATION:  Weight 213 pounds.  This is down 7 pounds from  November.  Blood pressure 115/70.  Heart rate 66.   Laboratory data includes a total cholesterol of 118, triglycerides 63,  HDL 43.3, LDL 62.  LFTs are within normal limits.   ASSESSMENT:  [name] [name]' triglycerides, HDL, and LDL are at goal.  He is  tolerating his current therapy just fine.  He continues some sort of a  heart-healthy diet, for the most part.  He does not have a regular  exercise regimen, but he does get out and do yard work quite regularly.   PLAN:  We are going to continue his same medications and follow up in  six months with a repeat lipid and liver panel, making adjustments that  are necessary at that time.  He was instructed to call us with any  concerns or questions in the meantime.   This patient was seen along with Lara Mulch, PharmD candidate.      Charolotte Eke, PharmD  Electronically Signed      Rollene Rotunda, MD, [facility]  Electronically Signed   TP/MedQ  DD: 03/16/2008  DT: 03/16/2008  Job #: [id number]

## 2011-04-08 NOTE — Assessment & Plan Note (Signed)
[facility]                            CARDIOLOGY OFFICE NOTE   NAME:Ewing, TAIWAN TALCOTT                      MRN:          [id number]  DATE:04/06/2007                            DOB:          24-Dec-1924    Mr. Quiroa is a very pleasant 77 year old white married male with  coronary artery disease, initial myocardial infarction complicated by  ventricular fibrillation in 1991.  This was out of hospital ventricular  fibrillation.  He had a subendocardial MI in 1994.  Catheterization at  that time revealed 50% diagonal, 30% LAD, 30% circumflex, total  intermediate branch occlusion and 30% RCA with normal LV.  Most recent  Myoview in January 2005 revealed good wall motion without abnormality,  good EF with no evidence of ischemia.  The patient has history of a TIA  and has been in chronic atrial fibrillation over the past year or so.  He is having no cardiac symptoms at this time and feels quite well.  He  will be 82 in August.  He is on:  1. Metoprolol 100 b.i.d.  2. Altace 10.  3. Niaspan 2 g daily.  4. Lipitor 80.  5. A Rely study drug dabigatran 110 mg b.i.d.  6. Digoxin 0.25 mg daily.  7. Aspirin 81.  8. Prednisone.   PHYSICAL EXAMINATION:  VITAL SIGNS:  Blood pressure 100/60, pulse 74,  atrial fibrillation.  GENERAL APPEARANCE:  Normal.  NECK:  JVP is not elevated.  Carotid pulses palpable and equal without  bruits.  LUNGS:  Clear.  CARDIAC:  Atrial fibrillation, no murmur or gallop.  ABDOMEN:  Normal.  EXTREMITIES:  2+ edema on the left, trace on the right.   EKG revels atrial fibrillation, rare PVC.  Recent BNP and lipids were  reviewed.  The LDL was 50.  The BNP was normal except for chloride  slightly elevated at 34.   I suggest the patient continue on the same therapy.  He will see Dr.  Daleen Squibb for continued cardiology followup in 4 months.     Cecil Cranker, MD, [facility]  Electronically Signed    EJL/MedQ  DD: 04/06/2007  DT:  04/06/2007  Job #: [id number]

## 2011-04-08 NOTE — Assessment & Plan Note (Signed)
[facility]                               LIPID CLINIC NOTE   NAME:JONESDrequan, Ironside                      MRN:          [id number]  DATE:03/29/2007                            DOB:          12/09/1924    HISTORY OF PRESENT ILLNESS:  The patient is seen in the Lipid Clinic for  further evaluation and medication titration associated with his  hyperlipidemia in the setting of documented coronary disease.  Mr. Radin  states he has had no muscle aches, pains, weakness, fatigue or problems  associated with this combination of lipid lowering therapy of Niaspan  and Lipitor.  He has been tolerating his medication and taking it on a  regular basis.  He has decreased his red meat and whole milk intake.  In  the past six months, he has reduced his mild fat content to 2% on a  regular basis.  He continues to work on his exercise bike, one to one  and a half miles daily.   PAST MEDICAL HISTORY:  Coronary disease.   CURRENT MEDICATIONS:  1. Metoprolol 100 mg b.i.d.  2. Altace 10 mg daily.  3. Niaspan 1000 mg b.i.d.  4. Lipitor 80 mg once daily.  5. __________ medication daily as directed.  6. Digoxin 0.25 mg daily.  7. Aspirin 81 mg daily.  8. Prednisone 5 mg daily for arthritis.  9. Darvocet as needed for pain.  10.Multivitamin.  11.Calcium with Vitamin D daily.   REVIEW OF SYSTEMS:  Negative unless otherwise noted.   PHYSICAL EXAMINATION:  VITAL SIGNS:  Weight 211 pounds, blood pressure  110/60, respiratory rate 16.   LABORATORY DATA:  Total cholesterol 120, triglyceride 126, HDL 44.5, LDL  50, LFT's within normal limits.   ASSESSMENT:  The patient meets primary tertiary and secondary goals for  lipid lowering therapy at this time.  The patient will continue with  diet and exercise modification therapy.  He  will continue on his drug therapy as well.  Gave him samples of Lipitor  and Niaspan, and he will follow up in six months.  Azucena Freed,  PharmD participated in this visit.      Shelby Dubin, PharmD, BCPS, CPP       Rollene Rotunda, MD, [facility]    MP/MedQ  DD: 03/29/2007  DT: 03/29/2007  Job #: [id number]   cc:   Cecil Cranker, MD, [facility]

## 2011-04-08 NOTE — Procedures (Signed)
CAROTID DUPLEX EXAM   INDICATION:  Carotid disease with known right ICA occlusion.   HISTORY:  Diabetes:  No.  Cardiac:  MI.  Hypertension:  Yes.  Smoking:  Previous.  Previous Surgery:  No carotid surgery.  CV History:  Asymptomatic.  Amaurosis Fugax No, Paresthesias No, Hemiparesis No.                                       RIGHT             LEFT  Brachial systolic pressure:  Brachial Doppler waveforms:  Vertebral direction of flow:        Antegrade         Antegrade  DUPLEX VELOCITIES (cm/sec)  CCA peak systolic                                     113  ECA peak systolic                                     175  ICA peak systolic                                     104  ICA end diastolic                                     31  PLAQUE MORPHOLOGY:                                    Calcific  PLAQUE AMOUNT:                                        Moderate in  bifurcation  PLAQUE LOCATION:                                      Bifurcation   IMPRESSION:  1. Limited left-side-only study.  2. Right internal carotid artery known occlusion.  3. Left internal carotid artery shows evidence of 1% to 39% stenosis      with a sharp dive mid vessel.  4. Calcific plaque and acoustic shadowing limited visualization in      left bifurcation.  5. Bilateral vertebral arteries appear antegrade.   ___________________________________________  Quita Skye. Hart Rochester, M.D.   AS/MEDQ  D:  04/30/2010  T:  05/01/2010  Job:  [id number]

## 2011-04-08 NOTE — Consult Note (Signed)
NEW PATIENT CONSULTATION   [name] [name], [name] [name]  DOB:  03-03-1925                                       04/30/2010  ZOXWR#:[id number]   The patient is an 77 year old male patient referred by Dr. Tawanna Cooler for  carotid occlusive disease and recent possible TIA.  This patient states  that about 3 weeks ago he developed sudden blurred vision involving both  eyes.  He had not had this in the past and it was not associated with  any hemiparesis, aphasia or other specific neurologic symptoms.  This  lasted 15-20 minutes and never resulted in total blindness but just  blurred vision.  This then resolved and he has had his baseline vision  since then.  He has no history of previous stroke.  He had a workup at  [facility] which included MRA of the neck as well as an MRI of the  head.  No acute stroke was noted.  He was found to have an occluded  right internal carotid artery and what appears to be a mildly stenotic  left internal carotid artery at the origin as well as some significant  common carotid disease bilaterally near the aortic arch.  I have  reviewed the studies and agree with that interpretation.  He was  referred for further evaluation.   CHRONIC MEDICAL PROBLEMS:  1. Chronic atrial fibrillation on Coumadin.  2. Coronary artery disease with previous myocardial infarctions.  3. Hypertension.  4. Hyperlipidemia.  5. Rheumatoid arthritis.  6. Negative for diabetes, COPD or stroke.   FAMILY HISTORY:  Positive for coronary artery disease in his mother,  father and sister and a light stroke in his father.  Negative for  diabetes.   SOCIAL HISTORY:  He is married, has four children and is a retired [place]  [employer].  He has not smoked since the mid 1990s but  smoked a pack a day for more than 40 years.  Drinks alcohol daily.   REVIEW OF SYSTEMS:  Denies any chest pain.  Does have chronic atrial  fibrillation, arthritis, joint pain.  Denies any  dyspnea on exertion,  claudication.  No GI or GU symptoms.  All other systems in review of  systems are negative.   PHYSICAL EXAM:  Vital signs:  Blood pressure 124/73, heart rate 79,  respirations 14.  General:  He is a well-developed, well-nourished  elderly male in no apparent distress.  He is alert and oriented times 3.  HEENT:  Exam is normal for age.  EOMs intact.  Lungs:  Are clear to  auscultation.  No rhonchi or wheezing.  Cardiovascular:  Reveals an  irregularly irregular rhythm with no murmurs.  Carotid pulses are 3+  bilaterally with no bruits audible.  Neurological:  Normal.  Abdomen:  Soft, nontender with no palpable masses.  Musculoskeletal:  Exam is free  of major deformities.  Skin:  Free of rashes.  Lower extremity exam  reveals 3+ femoral pulses, 1-2 plus edema bilaterally with no evidence  of acute thrombophlebitis.   Having reviewed his MRA study the left internal carotid appears  irregular throughout its course with no areas of significant stenosis.  Since his right ICA is totally occluded I wanted to be certain about  this.  Therefore I ordered a carotid duplex exam today on the left which  I have reviewed and interpreted.  He has very heavy calcification at his  carotid bifurcation on the left but no evidence of severe stenosis with  only mild elevation of velocities.   I would suspect that his left internal carotid stenosis is in the 30%  range of severity.  He does likely have some moderate left common  carotid disease proximally but does have a good pulse on physical exam.   I do not think his symptoms are related to his carotid disease and I  would not recommend any treatment at this time.  We will follow him on  an annual basis with carotid duplex exam to look for progression of  disease on the left side and if he develops any new neurologic symptoms  he will be in touch with [place].     Quita Skye Hart [place], M.D.  Electronically Signed   JDL/MEDQ  D:   04/30/2010  [name]:  05/01/2010  Job:  [id number]   cc:   Tinnie Gens A. Tawanna Cooler, MD

## 2011-04-10 ENCOUNTER — Ambulatory Visit (INDEPENDENT_AMBULATORY_CARE_PROVIDER_SITE_OTHER): Payer: Medicare Other | Admitting: Internal Medicine

## 2011-04-10 ENCOUNTER — Ambulatory Visit (INDEPENDENT_AMBULATORY_CARE_PROVIDER_SITE_OTHER): Payer: Medicare Other | Admitting: Pharmacist

## 2011-04-10 ENCOUNTER — Telehealth: Payer: Self-pay | Admitting: Family Medicine

## 2011-04-10 VITALS — BP 112/68

## 2011-04-10 DIAGNOSIS — R0989 Other specified symptoms and signs involving the circulatory and respiratory systems: Secondary | ICD-10-CM

## 2011-04-10 DIAGNOSIS — I4891 Unspecified atrial fibrillation: Secondary | ICD-10-CM

## 2011-04-10 DIAGNOSIS — Z8679 Personal history of other diseases of the circulatory system: Secondary | ICD-10-CM

## 2011-04-10 DIAGNOSIS — E785 Hyperlipidemia, unspecified: Secondary | ICD-10-CM

## 2011-04-10 LAB — POCT INR: INR: 4.6

## 2011-04-10 NOTE — Telephone Encounter (Signed)
Please refill cough syrup at [facility]

## 2011-04-10 NOTE — Assessment & Plan Note (Signed)
Lipid results are as follows:  TC 107 (was 106, goal <200), TG 39 (was 39, goal <150), HDL 48.4 (was 51, goal >40), LDL 51 (was 47, goal <70).  LFTs are WNL.  Patient is at goal for all values at this time.  He maintains a relatively healthy diet, but is not currently exercising.  I have encouraged patient to continue healthy diet and continue to incorporate plenty of fruits and vegetables.  I have also encouraged him to begin mild, low impact exercise.    Plan: 1)  Continue current medications as prescribed 2)  Continue making healthy dietary choices 3)  Attempt to begin mild, low-impact exercise such as walking at least 2-3 times per week 4)  Return for follow-up in 6 months

## 2011-04-10 NOTE — Patient Instructions (Addendum)
1)  Continue current medications as prescribed 2)  Attempt to make healthy dietary choices and eat plenty of fruits and vegetables 3)  Attempt to do mild exercise 2-3 times per week including mild walking or mild, low resistance exercise on the stationary bike 4)  Follow-up in 6 months on Thursday November 15th @ 10:30 am, and labwork at [place] office on Monday, November 12 @ 8:00 am

## 2011-04-10 NOTE — Telephone Encounter (Signed)
ok 

## 2011-04-10 NOTE — Progress Notes (Signed)
[name] presents today for 6 month follow-up of dyslipidemia.  Current medication regimen includes atorvastatin 20 mg daily and Niaspan 1000 mg - 2 tablet qHS.  He denies any muscle aches, cramps, pains or other medication adverse events.  He also reports compliance with all medications.    Dietary review reveals that the patient is limiting fats and attempting to make healthy dietary choices.  He reports that his appetite has not been as good lately and he has not eaten as much.    He is not currently exercising, but does have a stationary bike at home.  It is currently being used as a clothes rack, but patient says he could use it if it were clean.  We have discussed mild exercise options like a low-resistance setting on the bike or low impact walking.    Patient's lipids have been at goal for some time now.

## 2011-04-11 MED ORDER — HYDROCODONE-HOMATROPINE 5-1.5 MG/5ML PO SYRP: 5.0000 mL | ORAL_SOLUTION | Freq: Four times a day (QID) | ORAL | Status: AC | PRN

## 2011-04-11 NOTE — Assessment & Plan Note (Signed)
[facility]                                 LIPID CLINIC NOTE   NAME:JONESDashiell, Franchino                      MRN:          [id number]  DATE:10/01/2006                            DOB:          04-Mar-1925    Mr. Deutschman comes in today with his wife, feeling good with no specific  complaints.  He has been compliant and tolerating his current cholesterol  regimen which includes Niaspan 2 gm at bedtime and Lipitor 80 mg daily.  His  other medications include metoprolol, Altace, study drug Dabigatran,  digoxin, baby aspirin, multivitamin, and calcium supplement.   PHYSICAL EXAM:  VITAL SIGNS:  Weight 230 pounds.  Blood pressure 124/73.  Heart rate 63.   LABORATORY DATA:  Total cholesterol 105.  Triglycerides 67.  HDL 31.2.  LDL  60.  Liver function tests are within normal limits.   ASSESSMENT:  Mr. Maloof' triglycerides and LDL are at goal.  HDL, however,  remains below the goal of greater than 40.  He and his wife have been  maintaining a heart-healthy diet as much as possible, eating whole wheat  cereals for breakfast, although they do indulge in whole milk.  They limit  red meat to once per week and also with seafood one meal a week or less.  Most of the meat they eat is chicken.  Exercise for Mr. Roseboom includes a  stationary bicycle which he rides for 2 to 2-1/2 miles 2-3 days out of each  week.   PLAN:  I am not going to make any changes to Mr. Cefalu' medications at this  time.  He is currently at the maximum dose of Niaspan and Lipitor.  If we  made any changes it would include starting a third cholesterol-lowering  medication and he is resistant to this at this time.  I am hoping that in  the future his HDL may improve with continued exercise and diet changes.  We  are going to follow up with him in six months and recheck lipid and liver  panel.      Charolotte Eke, PharmD  Electronically Signed      Doylene Canning. Ladona Ridgel, MD  Electronically  Signed   TP/MedQ  DD: 10/01/2006  DT: 10/01/2006  Job #: [id number]

## 2011-04-11 NOTE — Discharge Summary (Signed)
NAME:  [name] [name], [name] [name] NO.:  [id number]   MEDICAL RECORD NO.:  [id number]          PATIENT TYPE:  INP   LOCATION:  3729                         FACILITY:  [facility]   PHYSICIAN:  Jerome Bing, M.D.  DATE OF BIRTH:  02/14/1925   DATE OF ADMISSION:  06/28/2005  DATE OF DISCHARGE:  06/30/2005                           DISCHARGE SUMMARY - REFERRING   HISTORY OF PRESENT ILLNESS:  [name] [name] is a 77 year old white male who  presented to the emergency room complaining of a three-day constant  headache.  However, just prior to his arrival to the emergency room, he had  taken two Tylenol which had resolved his headache.  On presentation to the  emergency room, he was noted to be in atrial fibrillation with a rapid  ventricular rate.  He was apparently unaware of this and asymptomatic.   PAST MEDICAL HISTORY:  1.  Hyperlipidemia, followed by [facility].  2.  History of a TIA in 1998 and 1999.  Both were preceded by headache and      left hand dysfunction.  3.  History of hypertension.  4.  Supposedly two myocardial infarctions in 1992 and 1992, specifics      unknown.  Catheterization in the early 1990s, specifics unknown.  Last      stress Cardiolite in January of 2005 showing an EF of 54%, no ischemia.  5.  Remote tobacco use.   LABORATORY DATA:  Chest x-ray showed no active disease.  Head CT did not  show any acute intracranial abnormality, diffuse atrophy, and old right  frontal infarct.   Admission H&H was 14.6 and 43.0, normal indices, platelets 245, WBC 11.9.  At the time of discharge, H&H 12.1 and 34.5, normal indices, platelets 216,  WBC 10.5.  ESR was 25.  Admission PTT was greater than 200, PT 15.1, with an  INR of 1.2.  At the time of discharge, PT was 14.2, INR 1.1.  Admission  sodium was 141, potassium 3.9, glucose 139, BUN 8, creatinine 1.0.  Normal  LFTs.  Magnesium 2.0.  Prior to discharge, potassium was 3.9, BUN 11,  creatinine 1.0.  Magnesium  2.2.  CK-MB and troponin marker x1 was negative  for myocardial infarction.  Fasting lipids shows a total cholesterol of 97,  triglycerides 39, HDL 42, LDL 47.  TSH was 4.093.   EKG on admission showed atrial fibrillation with a ventricular rate of 110,  normal axis, early R wave, nonspecific T wave changes.  EKG prior to  discharge showed normal sinus rhythm.   HOSPITAL COURSE:  [name] [name] was admitted to 3700 for new onset atrial  fibrillation of uncertain duration.  He was placed on IV heparin, and his  Lopressor dose was increased for rate control.  Echocardiogram was also  ordered.  Overnight, he did well but remained in AF.  However, late on the  morning of the 6th, he converted to normal sinus rhythm.  Nursing also noted  a 13 beat run of wide-complex tachycardia that was also asymptomatic.  Potassium and magnesium were within normal limits.  On June 30, 2005,  Dr.  Dietrich Pates felt that he could be discharged home with an outpatient  echocardiogram and initiation of Coumadin therapy as an outpatient, with a  target INR of 2 to 3.  During his hospitalization, he did receive atrial  fibrillation and Coumadin teaching.  On the 6th, he received 7.5 mg of  Coumadin.   DISCHARGE DIAGNOSES:  1.  Atrial fibrillation with a rapid ventricular rate of uncertain duration,      now in normal sinus rhythm.  2.  Headache, unspecified etiology.  3.  History as previous disposition.   DISPOSITION:  He was discharged home and asked to maintain a low salt, low  fat, low cholesterol diet.  His activities are not restricted.  He was given  permission to return to work.   DISCHARGE MEDICATIONS:  1.  Increase of his Lopressor to 100 mg b.i.d.  2.  Multivitamin daily.  3.  Aspirin 81 mg daily.  4.  Altace 10 mg daily.  5.  Lipitor 80 mg q.h.s.  6.  Niacin 2000 mg daily.  7.  Caltrate daily.  8.  He was asked not to take his Plavix, as Coumadin will take the place of      this.  9.  Coumadin 5  mg.  He is instructed to take one and a half tablets daily,      with a PT/INR check on Wednesday.   DISCHARGE INSTRUCTIONS:  He will have PT/INR on Wednesday, July 02, 2005 at  9:30, an echocardiogram on July 08, 2005 at 2 p.m. at Dr. Blossom Hoops  office, and he will see Dr. Blossom Hoops physician assistant on July 16, 2005  at 12:30.      Joellyn Rued, P.A. [facility]      ______________________________  [place] Bing, M.D.    EW/MEDQ  D:  06/30/2005  T:  06/30/2005  Job:  [id number]   cc:   Tinnie Gens A. Tawanna Cooler, M.D. [facility]  [address] [place]  [place] [address]   E. Graceann Congress, M.D.  [address]  [address]  [place]  [place] [address]

## 2011-04-11 NOTE — Telephone Encounter (Signed)
rx called in and patient is aware 

## 2011-04-11 NOTE — H&P (Signed)
[facility] ADMISSION   NAME:[name] [name], [name] [name]                      MRN:          [id number]  DATE:06/23/2006                            DOB:          1924-12-20    CHIEF COMPLAINT:  Dizziness.   HPI:  This is a pleasant, 77 year old, married white male patient, who  walked into the office, complaining of dizziness since he awakened this  morning.  He has not passed out, but felt like he could have.  He was found  to be in rapid atrial fibrillation at a rate of 120 beats per minute and  will be admitted for rate control.  He was also found to be orthostatic in  the office.  The patient has a history of atrial fibrillation.  He presented  to the hospital last August with a headache, no palpitations, but was found  to be in rapid atrial fibrillation at that time.  He converted with  Lopressor and was enrolled in the Rely study and has been in normal sinus  rhythm since, until today.   CURRENT MEDICATIONS:  1.  Centrum Silver daily.  2.  Calcium daily.  3.  Altace 10 mg daily.  4.  Aspirin 81 mg daily.  5.  Niaspan 2 g q.h.s.  6.  Lipitor 80 mg daily.  7.  Metoprolol 100 mg twice daily.  8.  The Rely Study drugs.   PAST MEDICAL HISTORY:  Significant for coronary artery disease.  He had a  subendocardial MI, complicated by ventricular fibrillation in 1994.  At that  time, he was found to have a total ramus intermedius, 50% first diagonal,  30% LAD and 30% circumflex and 30% proximal RCA.  Medical therapy was  recommended.   He has not had a catheterization since then.  His last stress Cardiolite was  in 2005.  He had some mild decreased __________  in the base of the inferior  wall and inferior apex, no significant reversibility, no definite sign of  ischemia, good wall motion and ejection fraction.  History of TIA in 1999.  History of hyperlipidemia, obesity, remote tobacco history.   SOCIAL HISTORY:   He is married.  He has quit smoking.  Drinks socially.   FAMILY HISTORY:  Father died of an MI at 44, mother at 28 with heart  trouble.  Two brothers and two sisters.  One brother was lost at sea.   REVIEW OF SYSTEMS:  Negative for dyspepsia, dysphagia, nausea or vomiting,  change in bowels or melena.  He does have dizziness and presyncope.  Otherwise, review of systems negative.   PHYSICAL EXAMINATION:  GENERAL:  This is a very pleasant 77 year old white  male, complaining of dizziness.  VITAL SIGNS:  Blood pressure, lying, 106/72, with a heart rate of 102.  Sitting, 111/57, heart rate of 86.  Standing, blood pressure of 83/57, heart  rate of 61.  After two minutes, it came up to 108/69 with pulse of 60.  After five minutes, 112/68 with a pulse of 64.  HEENT:  Head is  normocephalic without sign of trauma.  Extraocular eye  movements are intact.  Pupils were equal, reactive to light and  accommodation.  Mucosa is moist.  Throat is without erythema or exudate.  NECK:  Without JVD, HJR, bruit or thyroid enlargement.  LUNGS:  Clear anterior, posterior and lateral.  HEART:  Irregular rate and rhythm at 120 beats per minute.  Normal S1 and S2  with 1/6 systolic murmur at the left sternal border.  ABDOMEN:  Obese, normoactive bowel sounds are heard throughout.  EXTREMITIES:  Without clubbing, cyanosis or edema.  There are good distal  pulses.  NEUROLOGICAL EXAM:  Without focal deficit.   IMPRESSION:  1.  Paroxysmal atrial fibrillation with rapid ventricular rate.  2.  Rely Study.  3.  History of coronary artery disease, status post MI with V-fib in 1994.      Last stress Cardiolite in 2005.  Total occlusion of the intermediate at      that time.  4.  History of TIA in 1999.  5.  Hyperlipidemia.  6.  Remote tobacco abuse.   PLAN:  We will admit the patient to [facility].  We will start IV  Cardizem and Digoxin for better rate control.                                   Jacolyn Reedy, PA-C                                Cecil Cranker, MD, [facility]   ML/MedQ  DD:  06/23/2006  DT:  06/23/2006  Job #:  [id number]

## 2011-04-11 NOTE — Assessment & Plan Note (Signed)
[place]                            CARDIOLOGY OFFICE NOTE   NAME:[name] [name] KLAS                      MRN:          [id number]  DATE:10/20/2006                            DOB:          1925/01/01    This gentleman is a very pleasant 77 year old white married male with  coronary artery disease with initial myocardial infarction complicated  by ventricular fibrillation in 1991.  He had subsequent subendocardial  MI in 1994.  Catheterization at that time revealed 50% diagonal, 30%  LAD, 30% circumflex, total intermediate branch occlusion, 30% RCA.  LV  was normal.   The most recent Myoview on December 06, 2003 revealed good wall motion,  normal EF, no definite sign of ischemia.   The patient has a history of a TIA in 1999.  He also has a history of  hyperlipidemia, obesity and remote tobacco use.   MEDICATIONS:  1. Metoprolol 100 b.i.d.  2. Altace 10.  3. Niaspan 2 gm h.s.  4. Lipitor 80.  5. A study drug, Dabigatran 100 b.i.d.  6. Digoxin 250.  7. Aspirin 81.  8. Multivitamin.   PHYSICAL EXAMINATION:  VITAL SIGNS:  Blood pressure 102/58, pulse 71.  Atrial fibrillation with a few PVC's.  GENERAL APPEARANCE:  Normal.  NECK:  JVP is not elevated.  Carotid pulses bilaterally equal without  bruit.  LUNGS:  Clear.  CARDIAC:  Atrial fibrillation without murmur.  ABDOMEN:  Unremarkable.  EXTREMITIES:  1 to 2+ edema.   IMPRESSION:  1. Coronary artery disease as described above - asymptomatic.  2. Paroxysmal atrial fibrillation.  The patient in fibrillation at      this time, having been in normal sinus rhythm when seen in August.   I have suggested the patient continue on the same therapy.  Will plan to  check a BMP today, and I will see him back in 3 months, at which time he  should have a followup lipid profile.  Also, will plan Myoview next  year.   ADDENDUM:  EKG reveals atrial fibrillation with controlled ventricular  response.  There  is rare PVC.     Cecil Cranker, MD, [facility]  Electronically Signed    EJL/MedQ  DD: 10/20/2006  DT: 10/20/2006  Job #: [id number]

## 2011-04-11 NOTE — Assessment & Plan Note (Signed)
[facility]                            CARDIOLOGY OFFICE NOTE   NAME:[name] [name] SCALERA                      MRN:          [id number]  DATE:01/04/2007                            DOB:          28-Jul-1925    [name] [name] is a very pleasant 77 year old married white married male with  coronary artery disease with initial myocardial infarction complicated  by ventricular fibrillation in 1991.  History of subsequent  subendocardial MI in 1994.  Catheterization at that time revealed a 50%  diagonal, 30% LAD, 30% circumflex, total intermediate branch occlusion,  and 30% RCA.  LV was normal.  The most recent Myoview on December 06, 2003 revealed good wall motion abnormality and goal EF with no evidence  of ischemia.  The patient had a history of a TIA in 1999.  He also has  chronic atrial fibrillation, hyperlipidemia, obesity and remote tobacco  abuse.   MEDICATIONS:  1. Metoprolol 100 b.i.d.  2. Altace 10.  3. Niaspan gm h.s.  4. Lipitor 80.  5. RELY study drug.  6. Digoxin 250.  7. Aspirin 81.  8. Multivitamin.   PHYSICAL EXAMINATION:  VITAL SIGNS:  Blood pressure 104/62, pulse 62,  pulse 76, atrial fibrillation.  GENERAL APPEARANCE:  Normal.  NECK:  JVP is not elevated.  Carotid pulses palpated without bruits.  LUNGS:  Clear.  CARDIAC:  Atrial fibrillation.  No murmur.  ABDOMEN:  Normal.  EXTREMITIES:  1 to 2+ edema.   I should note the patient is having no cardiac symptoms and feels quite  well.   IMPRESSION/DIAGNOSES:  1. Atrial fibrillation.  2. Coronary artery disease, asymptomatic.  3. Hypertension, controlled.  4. Hyperlipidemia.  5. History of transient ischemic attack in 1999.  6. Remote tobacco abuse.   EKG reveals atrial fibrillation; otherwise normal.   I suggest he continue the same therapy with plan of BMP, lipid and LFT's  in a month.  I will see him back in 3 months.     Cecil Cranker, MD, [facility]  Electronically  Signed    EJL/MedQ  DD: 01/04/2007  DT: 01/04/2007  Job #: [id number]

## 2011-04-11 NOTE — Assessment & Plan Note (Signed)
[place] [facility]                              CARDIOLOGY OFFICE NOTE   NAME:JONESNoell, Shular                      MRN:          [id number]  DATE:07/08/2006                            DOB:          17-Jun-1925    This is a 77 year old white married male with atrial fibrillation, rapid  ventricular rate on his last study, history of coronary artery disease  status post MI with VFib in 1994.  He had trouble __________ at that time,  history of TIA in 1999, hyperlipidemia and remote tobacco abuse.   The patient was admitted on June 23, 2006 for atrial fibrillation with rapid  ventricular response.  Started on IV Cardizem and digoxin, essentially  converted to normal sinus rhythm and he was discharged the following day.   He has had no recurrent symptoms at present.   CURRENT MEDICATIONS:  1. Metoprolol 100 b.i.d.  2. Altace 10.  3. Niaspan 2 g h.s.  4. Lipitor 80.  5. He is on his last study drug dabitatian 110 b.i.d.  6. Digoxin 0.25 daily.  7. Aspirin 81.   PHYSICAL EXAMINATION:  VITAL SIGNS:  Blood pressure is 132/71, pulse 55  sinus bradycardia.  GENERAL APPEARANCE:  Normal.  NECK:  JVPs not elevated.  Carotid pulses are good without bruits.  LUNGS:  Clear.  CARDIAC:  Normal except for a 1/6 holosystolic murmur at the left sternal  border.  ABDOMEN:  Unremarkable.  EXTREMITIES:  Normal.   IMPRESSION:  1. Paroxysmal atrial fibrillation responding to therapy.  He is on his      last study.  2. History of coronary artery bypass graft with myocardial infarction and      ventricular fibrillation in 1994.  3. History of transient ischemic attack.  4. Hyperlipidemia.   I suggest the patient continue the same therapy.  His electrocardiogram  today reveals sinus bradycardia within normal limits.   Will plan to see him back in two months or p.r.n.                              E. Graceann Congress, MD, [facility]    EJL/MedQ  DD:  07/08/2006  DT:   07/08/2006  Job #:  [id number]

## 2011-04-11 NOTE — Assessment & Plan Note (Signed)
[place] [facility]                              CARDIOLOGY OFFICE NOTE   NAME:Yambao, NABEEL GLADSON                      MRN:          [id number]  DATE:                                      DOB:          [birth year]    INCOMPLETE DICTATION                                  Jacolyn Reedy, PA-C    ML/MedQ  DD:  06/23/2006  DT:  06/23/2006  Job #:  [id number]

## 2011-04-11 NOTE — H&P (Signed)
NAME:  [name] [name], RONE NO.:  [id number]   MEDICAL RECORD NO.:  [id number]          PATIENT TYPE:  INP   LOCATION:  3729                         FACILITY:  [facility]   PHYSICIAN:  Charlton Haws, M.D.     DATE OF BIRTH:  04-Aug-1925   DATE OF ADMISSION:  06/28/2005  DATE OF DISCHARGE:                                HISTORY & PHYSICAL   PRIMARY CARE PHYSICIAN:  Tinnie Gens A. Tawanna Cooler, M.D., [place], [place].   PRIMARY CARDIOLOGIST:  Cecil Cranker, M.D.   SUMMARY OF HISTORY:  Mr. Frangos is a 77 year old white male who presented to  the emergency room complaining of a headache. He states that he has had a  constant dull headache for the preceding 3 days associated with fatigue. He  states this discomfort involves his entire head. It has not waxed and waned,  and he has not had any associated neurological symptoms. He states it has  been a 5 on a scale of 0/10. He has not tried to pursue any alleviating  factors. He cannot describe any aggravating factors. The patient's wife  called their daughter on the morning of admission. She gave her father 2  Tylenol which resolved a headache; however, the daughter also called his  primary care physician, and he referred them to the hospital given his  history of a three-day headache and his past medical history. By the time he  arrived to the emergency room, he stated that his headache had resolved and  that Tylenol had relived it. However, on presentation to the ER, the ER  physician noted that the patient was in atrial fibrillation which was a new  diagnosis for him.   PAST MEDICAL HISTORY:  No known drug allergies.   MEDICATIONS PRIOR TO ADMISSION:  1.  Plavix 75 milligrams q.d.  2.  Metoprolol 50 milligrams b.i.d..  3.  Multivitamin q.d.  4.  Calcium q.d.  5.  Altace 10 milligrams q.d.  6.  Aspirin 81 q.d.  7.  Lipitor 80 q.h.s.  8.  Niaspan 2000 milligrams q.h.s.   He has a history of hyperlipidemia and is followed in the  [facility]. Last fasting lipids on May 2006 showed total cholesterol 199,  triglycerides 76, HDL 34 and LDL of 68. He has a history of TIA in 1998 and  1999; both were preceded by headache and left hand dysfunction. Plavix was  started according to his daughter. He also has a history of hypertension. He  has had two myocardial infarctions in 1992 and 1994, specifics unknown. Last  catheterization was in the early 1990s, specifics unknown. His last stress  Cardiolite was December 06, 2003, which showed EF of 54%, no signs of  ischemia.  Surgical history is notable for bilateral cataract repair. He  denies any history of diabetes, COPD, bleeding dyscrasia, or thyroid  dysfunction.   SOCIAL HISTORY:  He resides in [place] with his wife. He has four  daughters, five grandchildren. He is still employed part time at [employer]. He has not smoked since 1992. Prior to that, he  smoked one to two packs per day for 40+ years. He has not had any alcohol  since 1992. Prior to that, he would drink approximately a fifth every 3  days. He denies any drugs, herbal medications. He does maintain a low-salt,  fat diet. He does not exercise routinely.   FAMILY HISTORY:  His mother died at the age of 89 secondary to hip fracture  complications. She did have a history of CAD and AAA. His father died at the  age 75 with myocardial infarction, history of hypertension. He has one  brother that is alive and well at 31.  Three brothers are deceased, one with  a myocardial infarction in his 65s, another while at sea while enlisted in  [employer], and the third unknown reasons. He has two sisters deceased, both  in the 70s and 73s from a CVA, myocardial infarction.   REVIEW OF SYSTEMS:  Is notable for glasses, partial occasional cough without  production, nocturia in addition to above. He denies any problems with chest  discomfort, shortness of breath, dyspnea on exertion,  orthopnea, PND, edema,  palpitations or syncope, recent illnesses over-the-counter medications,  excessive caffeine.   PHYSICAL EXAMINATION:  GENERAL:  Well-nourished, well-developed, pleasant  white male in no apparent distress.  VITAL SIGNS: Temperature 98, pulses 110 and irregular, respirations 22,  blood pressure is 98/68, 95% saturation on room air.  HEENT:  Essentially unremarkable except for glasses and partials.  NECK:  Supple without thyromegaly, adenopathy, JVD, or carotid bruits.  CHEST:  Symmetrical excursion. He has diminished breath sounds, but they are  clear to auscultation without rales, rhonchi or wheezing. Current PMI is  nondisplaced, distant heart sounds, irregular irregular rate and rhythm with  variable S1, normal S2. Did not appreciate any murmurs, rubs, clicks or  gallops. All peripheral pulses are symmetrical and intact.  SKIN:  Integument was intact.  ABDOMEN:  Obese. Bowel sounds present without organomegaly, masses or  tenderness.  EXTREMITIES:  Negative cyanosis, clubbing or edema.  MUSCULOSKELETAL:  Unremarkable.  NEUROLOGIC:  Was intact.   Chest x-ray:  Shows no active disease.   Head CT performed in the emergency room did not show any acute findings.   EKG revealed atrial fibrillation with a ventricular rate of 116, normal  axis, no ischemic changes. No old EKGs are available for comparison.   Labs are pending.   IMPRESSION:  1.  Prolonged headache with negative CT or neurological deficits.  2.  Atrial fibrillation with increased ventricular rate of unknown duration.      History as per Past Medical History.   DISPOSITION:  Dr. Eden Emms reviewed the patient's history, spoke with examined  the patient, and agrees with above. We will admit Mr. Bouillon to [facility], start him on IV heparin with the anticipation of Coumadin given  his history of TIAs. His Plavix may be discontinued in the future post Dr. Blossom Hoops review. An echocardiogram  will be ordered for Monday. We will  increase his Lopressor to 75 milligrams b.i.d. for rate control and hold his  Altace if his blood pressure is less than 100.  Will follow up on his labs.      Irving Burton   EW/MEDQ  D:  06/29/2005  T:  06/29/2005  Job:  [id number]   cc:   Tinnie Gens A. Tawanna Cooler, M.D. [facility]   Bea Laura Graceann Congress, M.D.   Wanda Plump, MD [facility]  [phone] [address] [place], [place] [address]

## 2011-04-18 ENCOUNTER — Encounter: Payer: Medicare Other | Admitting: *Deleted

## 2011-04-24 ENCOUNTER — Ambulatory Visit (INDEPENDENT_AMBULATORY_CARE_PROVIDER_SITE_OTHER): Payer: Medicare Other | Admitting: *Deleted

## 2011-04-24 DIAGNOSIS — I4891 Unspecified atrial fibrillation: Secondary | ICD-10-CM

## 2011-04-24 DIAGNOSIS — Z8679 Personal history of other diseases of the circulatory system: Secondary | ICD-10-CM

## 2011-04-24 LAB — POCT INR: INR: 1.9

## 2011-04-25 ENCOUNTER — Telehealth: Payer: Self-pay | Admitting: Cardiology

## 2011-04-25 MED ORDER — DIGOXIN 125 MCG PO TABS: 125.0000 ug | ORAL_TABLET | Freq: Every day | ORAL | Status: DC

## 2011-04-25 NOTE — Telephone Encounter (Signed)
Digoxin 0.12 mg [facility].

## 2011-04-25 NOTE — Telephone Encounter (Signed)
RX sent into pharmacy. Pt notified. 

## 2011-04-28 ENCOUNTER — Other Ambulatory Visit

## 2011-05-08 ENCOUNTER — Other Ambulatory Visit (INDEPENDENT_AMBULATORY_CARE_PROVIDER_SITE_OTHER): Payer: Medicare Other

## 2011-05-08 DIAGNOSIS — I6529 Occlusion and stenosis of unspecified carotid artery: Secondary | ICD-10-CM

## 2011-05-13 NOTE — Procedures (Unsigned)
CAROTID DUPLEX EXAM  INDICATION:  Follow up carotid artery disease.  HISTORY: Diabetes:  No. Cardiac:  MI. Hypertension:  Yes. Smoking:  Previous. Previous Surgery:  No. CV History:  Patient is currently asymptomatic. Amaurosis Fugax No, Paresthesias No, Hemiparesis No.                                      RIGHT             LEFT Brachial systolic pressure: Brachial Doppler waveforms: Vertebral direction of flow:                          Antegrade DUPLEX VELOCITIES (cm/sec) CCA peak systolic                                     83 ECA peak systolic                                     135 ICA peak systolic                                     140 ICA end diastolic                                     52 PLAQUE MORPHOLOGY:                                    Heterogenous PLAQUE AMOUNT:                                        Moderate PLAQUE LOCATION:                                      CCA, ICA, ECA, bulb  IMPRESSION:  Known right internal carotid artery occlusion.  40% to 59% stenosis within the left internal carotid artery.  Prominent left vertebral with velocity at 0.57 m/s.  A robust left external carotid artery.  ___________________________________________ Quita Skye Hart [place], M.D.  OD/MEDQ  D:  05/08/2011  T:  05/08/2011  Job:  [id number]

## 2011-05-15 ENCOUNTER — Ambulatory Visit (INDEPENDENT_AMBULATORY_CARE_PROVIDER_SITE_OTHER): Payer: Medicare Other | Admitting: *Deleted

## 2011-05-15 ENCOUNTER — Other Ambulatory Visit: Payer: Self-pay | Admitting: *Deleted

## 2011-05-15 DIAGNOSIS — Z8679 Personal history of other diseases of the circulatory system: Secondary | ICD-10-CM

## 2011-05-15 DIAGNOSIS — I4891 Unspecified atrial fibrillation: Secondary | ICD-10-CM

## 2011-05-15 MED ORDER — NIACIN ER (ANTIHYPERLIPIDEMIC) 1000 MG PO TBCR: 1000.0000 mg | EXTENDED_RELEASE_TABLET | Freq: Every day | ORAL | Status: DC

## 2011-06-12 ENCOUNTER — Ambulatory Visit (INDEPENDENT_AMBULATORY_CARE_PROVIDER_SITE_OTHER): Payer: Medicare Other | Admitting: *Deleted

## 2011-06-12 DIAGNOSIS — I4891 Unspecified atrial fibrillation: Secondary | ICD-10-CM

## 2011-06-12 DIAGNOSIS — Z8679 Personal history of other diseases of the circulatory system: Secondary | ICD-10-CM

## 2011-06-26 ENCOUNTER — Ambulatory Visit (INDEPENDENT_AMBULATORY_CARE_PROVIDER_SITE_OTHER): Payer: Medicare Other | Admitting: *Deleted

## 2011-06-26 DIAGNOSIS — Z8679 Personal history of other diseases of the circulatory system: Secondary | ICD-10-CM

## 2011-06-26 DIAGNOSIS — I4891 Unspecified atrial fibrillation: Secondary | ICD-10-CM

## 2011-07-01 ENCOUNTER — Telehealth: Payer: Self-pay | Admitting: Family Medicine

## 2011-07-01 MED ORDER — HYDROCODONE-HOMATROPINE 5-1.5 MG/5ML PO SYRP: 5.0000 mL | ORAL_SOLUTION | Freq: Four times a day (QID) | ORAL | Status: AC | PRN

## 2011-07-01 NOTE — Telephone Encounter (Signed)
rx called in.  patient  Is aware

## 2011-07-01 NOTE — Telephone Encounter (Signed)
Patient states he has a dry cough which increases at night.  He has no fever and it has continued for a few days.  [facility] [address]

## 2011-07-01 NOTE — Telephone Encounter (Signed)
Hydromet 4 ounces directions one half to 1 teaspoon at bedtime p.r.n. Cough.  No refills.  If symptoms persist after one week. ,,,,,,,,,,,,, Office visit

## 2011-07-01 NOTE — Telephone Encounter (Signed)
Refill Hydrocodone-homatropine [facility]

## 2011-07-01 NOTE — Telephone Encounter (Signed)
Please call patient why does he need cough syrup?????????

## 2011-07-10 ENCOUNTER — Ambulatory Visit (INDEPENDENT_AMBULATORY_CARE_PROVIDER_SITE_OTHER): Payer: Medicare Other | Admitting: *Deleted

## 2011-07-10 DIAGNOSIS — I4891 Unspecified atrial fibrillation: Secondary | ICD-10-CM

## 2011-07-10 DIAGNOSIS — Z8679 Personal history of other diseases of the circulatory system: Secondary | ICD-10-CM

## 2011-07-10 MED ORDER — WARFARIN SODIUM 5 MG PO TABS: ORAL_TABLET | ORAL | Status: DC

## 2011-07-23 ENCOUNTER — Ambulatory Visit (INDEPENDENT_AMBULATORY_CARE_PROVIDER_SITE_OTHER): Payer: Medicare Other | Admitting: *Deleted

## 2011-07-23 DIAGNOSIS — I4891 Unspecified atrial fibrillation: Secondary | ICD-10-CM

## 2011-07-23 DIAGNOSIS — Z8679 Personal history of other diseases of the circulatory system: Secondary | ICD-10-CM

## 2011-08-20 ENCOUNTER — Ambulatory Visit (INDEPENDENT_AMBULATORY_CARE_PROVIDER_SITE_OTHER): Payer: Medicare Other | Admitting: *Deleted

## 2011-08-20 DIAGNOSIS — I4891 Unspecified atrial fibrillation: Secondary | ICD-10-CM

## 2011-08-20 DIAGNOSIS — Z8679 Personal history of other diseases of the circulatory system: Secondary | ICD-10-CM

## 2011-08-20 LAB — POCT INR: INR: 2.7

## 2011-08-25 ENCOUNTER — Other Ambulatory Visit (INDEPENDENT_AMBULATORY_CARE_PROVIDER_SITE_OTHER): Payer: Medicare Other | Admitting: Family Medicine

## 2011-08-25 ENCOUNTER — Telehealth: Payer: Self-pay | Admitting: Family Medicine

## 2011-08-25 ENCOUNTER — Ambulatory Visit (INDEPENDENT_AMBULATORY_CARE_PROVIDER_SITE_OTHER): Payer: Medicare Other

## 2011-08-25 DIAGNOSIS — Z23 Encounter for immunization: Secondary | ICD-10-CM

## 2011-08-25 MED ORDER — HYDROCODONE-HOMATROPINE 5-1.5 MG/5ML PO SYRP: 5.0000 mL | ORAL_SOLUTION | Freq: Four times a day (QID) | ORAL | Status: AC | PRN

## 2011-08-25 NOTE — Telephone Encounter (Signed)
Hydromet 4 ounces directions one half to 1 teaspoon nightly p.r.n. Cough, refills x 1

## 2011-08-25 NOTE — Telephone Encounter (Signed)
Refill Hydrocodone-Homatropine cough syrup [facility] Thanks

## 2011-09-17 ENCOUNTER — Ambulatory Visit (INDEPENDENT_AMBULATORY_CARE_PROVIDER_SITE_OTHER): Payer: Medicare Other | Admitting: *Deleted

## 2011-09-17 DIAGNOSIS — Z7901 Long term (current) use of anticoagulants: Secondary | ICD-10-CM

## 2011-09-17 DIAGNOSIS — Z8679 Personal history of other diseases of the circulatory system: Secondary | ICD-10-CM

## 2011-09-17 DIAGNOSIS — I4891 Unspecified atrial fibrillation: Secondary | ICD-10-CM

## 2011-09-17 LAB — POCT INR: INR: 3.2

## 2011-09-26 ENCOUNTER — Encounter: Payer: Self-pay | Admitting: Cardiology

## 2011-09-26 ENCOUNTER — Ambulatory Visit (INDEPENDENT_AMBULATORY_CARE_PROVIDER_SITE_OTHER): Payer: Medicare Other | Admitting: Cardiology

## 2011-09-26 VITALS — BP 118/64 | HR 72 | Ht 72.0 in | Wt 205.0 lb

## 2011-09-26 DIAGNOSIS — Z8679 Personal history of other diseases of the circulatory system: Secondary | ICD-10-CM

## 2011-09-26 DIAGNOSIS — I2510 Atherosclerotic heart disease of native coronary artery without angina pectoris: Secondary | ICD-10-CM

## 2011-09-26 DIAGNOSIS — I4891 Unspecified atrial fibrillation: Secondary | ICD-10-CM

## 2011-09-26 DIAGNOSIS — Z7901 Long term (current) use of anticoagulants: Secondary | ICD-10-CM

## 2011-09-26 DIAGNOSIS — I252 Old myocardial infarction: Secondary | ICD-10-CM

## 2011-09-26 NOTE — Progress Notes (Signed)
HPI [name] is a transatrial evaluation and management of his history of coronary disease, history of myocardial infarction, chronic A. Fib on anticoagulation, carotid artery disease followed by vascular surgery team, and history of hypertension and hyperlipidemia.  He continues to do remarkably well. He denies any chest pain, palpitations, presyncope, syncope, orthopnea, PND, but does have some lower extremity edema. He has no chronic changes for years.  He denies any bleeding or problems with Coumadin. He denies any nausea, vomiting, diarrhea, or visual changes. He remains on digoxin.  He denies any symptoms of TIAs or mini strokes. These were carefully with him today. Past Medical History  Diagnosis Date  . Arrhythmia   . Carotid artery occlusion   . Coronary artery disease   . Hyperlipidemia   . Hypertension     Past Surgical History  Procedure Date  . Cardiac catheterization     Family History  Problem Relation Age of Onset  . Heart disease Mother   . Heart disease Father     History   Social History  . Marital Status: Married    Spouse Name: N/A    Number of Children: N/A  . Years of Education: N/A   Occupational History  . Not on file.   Social History Main Topics  . Smoking status: Former Smoker    Types: Cigarettes  . Smokeless tobacco: Never Used   Comment: quit around 1992  . Alcohol Use: No  . Drug Use: No  . Sexually Active: Not on file   Other Topics Concern  . Not on file   Social History Narrative  . No narrative on file    No Known Allergies  Current Outpatient Prescriptions  Medication Sig Dispense Refill  . aspirin 81 MG tablet Take 81 mg by mouth daily.        Marland Kitchen atorvastatin (LIPITOR) 20 MG tablet Take 20 mg by mouth daily.        . Calcium-Vitamin D (CALTRATE 600 PLUS-VIT D PO) Take by mouth.        . digoxin (LANOXIN) 0.125 MG tablet Take 1 tablet (125 mcg total) by mouth daily.  90 tablet  3  . Loratadine 10 MG CAPS Take by  mouth.        . methotrexate (RHEUMATREX) 2.5 MG tablet Take by mouth. Take 5 tabs Saturday and Sunday      . metoprolol (TOPROL-XL) 50 MG 24 hr tablet Take 50 mg by mouth 2 (two) times daily.        . Multiple Vitamins-Minerals (CENTRUM SILVER PO) Take by mouth.        . niacin (NIASPAN) 1000 MG CR tablet Take 1 tablet (1,000 mg total) by mouth at bedtime. 2 tabs at bedtime  180 tablet  2  . nitroGLYCERIN (NITROSTAT) 0.4 MG SL tablet Place 1 tablet (0.4 mg total) under the tongue every 5 (five) minutes as needed.  25 tablet  3  . traMADol (ULTRAM) 50 MG tablet Take 50 mg by mouth. Take one tab 2-3 times a day as needed       . warfarin (COUMADIN) 5 MG tablet Take as directed by Anticoagulation clinic  (Patient takes up to 1 tablet daily)  90 tablet  3    ROS Negative other than HPI.   PE General Appearance: well developed, well nourished in no acute distress, elderly HEENT: symmetrical face, PERRLA, good dentition  Neck: no JVD, thyromegaly, or adenopathy, trachea midline Chest: symmetric without deformity Cardiac: PMI non-displaced,  RRR, normal S1, S2, no gallop or murmur Lung: clear to ausculation and percussion Vascular: right external carotid brisk, decreased pulses in the lower extremities.  TheAbdominal: nondistended, nontender, good bowel sounds, no HSM, no bruits Extremities: no cyanosis, clubbing, chronic brawny edema with minimal pitting,no sign of DVT, no varicosities  Skin: normal color, no rashes Neuro: alert and oriented x 3, non-focal Pysch: normal affect Filed Vitals:   09/26/11 1056  BP: 118/64  Pulse: 72  Height: 6' (1.829 m)  Weight: 205 lb (92.987 kg)    EKG Chronic A. Fib, PA-C, rightward axis, nonspecific ST segment changes. Labs and Studies Reviewed.   Lab Results  Component Value Date   WBC 7.9 03/30/2010   HGB 13.3 03/30/2010   HCT 39.0 03/30/2010   MCV 102.2* 03/30/2010   PLT 139* 03/30/2010      Chemistry      Component Value Date/Time   NA 140  06/12/2010 0848   K 4.3 06/12/2010 0848   CL 108 06/12/2010 0848   CO2 33* 06/12/2010 0848   BUN 10 06/12/2010 0848   CREATININE 0.6 06/12/2010 0848      Component Value Date/Time   CALCIUM 8.3* 06/12/2010 0848   ALKPHOS 70 04/08/2011 0859   AST 38* 04/08/2011 0859   ALT 22 04/08/2011 0859   BILITOT 1.2 04/08/2011 0859       Lab Results  Component Value Date   CHOL 107 04/08/2011   CHOL 106 10/08/2010   CHOL 83 06/12/2010   Lab Results  Component Value Date   HDL 48.40 04/08/2011   HDL 16.10 10/08/2010   HDL 32.90* 06/12/2010   Lab Results  Component Value Date   LDLCALC 51 04/08/2011   LDLCALC 47 10/08/2010   LDLCALC 43 06/12/2010   Lab Results  Component Value Date   TRIG 39.0 04/08/2011   TRIG 39.0 10/08/2010   TRIG 35.0 06/12/2010   Lab Results  Component Value Date   CHOLHDL 2 04/08/2011   CHOLHDL 2 10/08/2010   CHOLHDL 3 06/12/2010   Lab Results  Component Value Date   HGBA1C  Value: 5.9 (NOTE)                                                                       According to the ADA Clinical Practice Recommendations for 2011, when HbA1c is used as a screening test:   >=6.5%   Diagnostic of Diabetes Mellitus           (if abnormal result  is confirmed)  5.7-6.4%   Increased risk of developing Diabetes Mellitus  References:Diagnosis and Classification of Diabetes Mellitus,Diabetes Care,2011,34(Suppl 1):S62-S69 and Standards of Medical Care in         Diabetes - 2011,Diabetes Care,2011,34  (Suppl 1):S11-S61.* 03/31/2010   Lab Results  Component Value Date   ALT 22 04/08/2011   AST 38* 04/08/2011   ALKPHOS 70 04/08/2011   BILITOT 1.2 04/08/2011   Lab Results  Component Value Date   TSH 4.07 02/24/2008

## 2011-09-26 NOTE — Assessment & Plan Note (Addendum)
Currently without any symptoms. Recent carotids are stable with a right total internal artery occlusion. Symptoms review and how and when  to respond to 911.

## 2011-09-26 NOTE — Patient Instructions (Signed)
Your physician recommends that you have lab work today for a digoxin level.  We will call you with your lab results.  Your physician wants you to follow-up in:  1 year with Dr. Daleen Squibb. You will receive a reminder letter in the mail two months in advance. If you don't receive a letter, please call our office to schedule the follow-up appointment.

## 2011-09-26 NOTE — Assessment & Plan Note (Signed)
Stable, asymptomatic, rate well controlled, on anticoagulation. Check digoxin level

## 2011-09-26 NOTE — Assessment & Plan Note (Signed)
Stable. No change in treatment. 

## 2011-10-06 ENCOUNTER — Ambulatory Visit: Payer: Medicare Other

## 2011-10-06 ENCOUNTER — Other Ambulatory Visit: Payer: Medicare Other

## 2011-10-06 ENCOUNTER — Other Ambulatory Visit: Payer: Medicare Other | Admitting: *Deleted

## 2011-10-06 DIAGNOSIS — E7800 Pure hypercholesterolemia, unspecified: Secondary | ICD-10-CM

## 2011-10-06 DIAGNOSIS — Z79899 Other long term (current) drug therapy: Secondary | ICD-10-CM

## 2011-10-07 LAB — LIPID PANEL
HDL: 50.2 mg/dL (ref >39.00)
LDL Cholesterol: 39 mg/dL (ref 0–99)
Total CHOL/HDL Ratio: 2
VLDL: 8.4 mg/dL (ref 0.0–40.0)

## 2011-10-07 LAB — HEPATIC FUNCTION PANEL
ALT: 21 U/L (ref 0–53)
Bilirubin, Direct: 0.2 mg/dL (ref 0.0–0.3)
Total Bilirubin: 1 mg/dL (ref 0.3–1.2)

## 2011-10-09 ENCOUNTER — Ambulatory Visit: Payer: Medicare Other

## 2011-10-09 ENCOUNTER — Encounter: Payer: Medicare Other | Admitting: *Deleted

## 2011-10-13 ENCOUNTER — Ambulatory Visit (INDEPENDENT_AMBULATORY_CARE_PROVIDER_SITE_OTHER): Payer: Medicare Other | Admitting: *Deleted

## 2011-10-13 ENCOUNTER — Ambulatory Visit: Payer: Medicare Other

## 2011-10-13 VITALS — BP 115/80 | HR 75 | Wt 211.0 lb

## 2011-10-13 DIAGNOSIS — Z8679 Personal history of other diseases of the circulatory system: Secondary | ICD-10-CM

## 2011-10-13 DIAGNOSIS — Z7901 Long term (current) use of anticoagulants: Secondary | ICD-10-CM

## 2011-10-13 DIAGNOSIS — E785 Hyperlipidemia, unspecified: Secondary | ICD-10-CM

## 2011-10-13 DIAGNOSIS — I4891 Unspecified atrial fibrillation: Secondary | ICD-10-CM

## 2011-10-13 LAB — POCT INR: INR: 1.4

## 2011-10-13 NOTE — Progress Notes (Signed)
HIP:  [name] [name] presents today for 6 month follow-up of dyslipidemia. Current medication regimen includes  lipitor 20mg  and niacin 1g QHS.   He denies any muscle aches, cramps, pains or other medication adverse events. He also reports compliance with all medications. Dietary review reveals that the patient eats out every night since his wife stopped cooking, but appears to make healthy dietary choices. He does not skip meals and seems to be eating well with occasional cookies for a snack. He is not currently exercising much, but does have a stationary bike at home that he used much more often when he was "in a better shape". Patient's lipids have been at goal for a long time (more than 2 years).  Assessment: Hyperlipidemia is well controlled on lipitor 20mg  and niacin 1g QHS. Lipid results are as follows: TC 98 (goal <200), TG 42 (goal <150), HDL 50 (goal >40), LDL 39 (goal <70). LFTs are WNL. Patient is at goal for all values at this time. Pt was encouraged to continue healthy dietary choices. Pt was told to get back to riding his stationary bicycle a few times a week for at least 20 minutes. Pt was instructed to take 1/2 tablet of his Lipitor 20 mg due to his low LDL.  Plan:  1) Continue current medications as prescribed, with exception of Lipitor. Take 1/2 tablet of Lipitor. 2) Continue making healthy dietary choices. 3) Attempt to ride the bicycle a few times a week for 20 min. 4) Return for follow-up in 6 months.

## 2011-10-19 MED ORDER — ATORVASTATIN CALCIUM 10 MG PO TABS: 10.0000 mg | ORAL_TABLET | Freq: Every day | ORAL | Status: DC

## 2011-10-19 NOTE — Patient Instructions (Signed)
Decrease Lipitor to 10mg  daily.  You may take 1/2 of your 20mg  tablet until you are finished.   Follow up in 6 months.  We will call you with an appointment.

## 2011-10-28 ENCOUNTER — Ambulatory Visit (INDEPENDENT_AMBULATORY_CARE_PROVIDER_SITE_OTHER): Payer: Medicare Other | Admitting: *Deleted

## 2011-10-28 DIAGNOSIS — Z7901 Long term (current) use of anticoagulants: Secondary | ICD-10-CM

## 2011-10-28 DIAGNOSIS — I4891 Unspecified atrial fibrillation: Secondary | ICD-10-CM

## 2011-10-28 DIAGNOSIS — Z8679 Personal history of other diseases of the circulatory system: Secondary | ICD-10-CM

## 2011-10-28 LAB — POCT INR: INR: 1.9

## 2011-11-11 ENCOUNTER — Other Ambulatory Visit: Payer: Self-pay | Admitting: Cardiology

## 2011-11-11 ENCOUNTER — Ambulatory Visit (INDEPENDENT_AMBULATORY_CARE_PROVIDER_SITE_OTHER): Payer: Medicare Other | Admitting: *Deleted

## 2011-11-11 DIAGNOSIS — I4891 Unspecified atrial fibrillation: Secondary | ICD-10-CM

## 2011-11-11 DIAGNOSIS — Z8679 Personal history of other diseases of the circulatory system: Secondary | ICD-10-CM

## 2011-11-11 DIAGNOSIS — Z7901 Long term (current) use of anticoagulants: Secondary | ICD-10-CM

## 2011-11-11 LAB — POCT INR: INR: 2.1

## 2011-11-11 MED ORDER — METOPROLOL SUCCINATE ER 50 MG PO TB24: 50.0000 mg | ORAL_TABLET | Freq: Two times a day (BID) | ORAL | Status: DC

## 2011-11-20 ENCOUNTER — Other Ambulatory Visit: Payer: Self-pay | Admitting: *Deleted

## 2011-11-20 MED ORDER — HYDROCODONE-HOMATROPINE 5-1.5 MG/5ML PO SYRP: 5.0000 mL | ORAL_SOLUTION | Freq: Three times a day (TID) | ORAL | Status: AC | PRN

## 2011-11-26 ENCOUNTER — Other Ambulatory Visit: Payer: Self-pay

## 2011-11-26 ENCOUNTER — Inpatient Hospital Stay (HOSPITAL_COMMUNITY)
Admission: EM | Admit: 2011-11-26 | Discharge: 2011-12-03 | DRG: 871 | Disposition: A | Discharge status: Home Health | Payer: Medicare Other | Attending: Family Medicine | Admitting: Family Medicine

## 2011-11-26 ENCOUNTER — Emergency Department (HOSPITAL_COMMUNITY): Payer: Medicare Other

## 2011-11-26 ENCOUNTER — Encounter (HOSPITAL_COMMUNITY): Payer: Self-pay | Admitting: Emergency Medicine

## 2011-11-26 DIAGNOSIS — Z7982 Long term (current) use of aspirin: Secondary | ICD-10-CM

## 2011-11-26 DIAGNOSIS — D72829 Elevated white blood cell count, unspecified: Secondary | ICD-10-CM | POA: Diagnosis present

## 2011-11-26 DIAGNOSIS — G319 Degenerative disease of nervous system, unspecified: Secondary | ICD-10-CM | POA: Diagnosis not present

## 2011-11-26 DIAGNOSIS — M069 Rheumatoid arthritis, unspecified: Secondary | ICD-10-CM | POA: Diagnosis present

## 2011-11-26 DIAGNOSIS — L03119 Cellulitis of unspecified part of limb: Secondary | ICD-10-CM | POA: Diagnosis present

## 2011-11-26 DIAGNOSIS — L02419 Cutaneous abscess of limb, unspecified: Secondary | ICD-10-CM | POA: Diagnosis present

## 2011-11-26 DIAGNOSIS — Z87891 Personal history of nicotine dependence: Secondary | ICD-10-CM

## 2011-11-26 DIAGNOSIS — R404 Transient alteration of awareness: Secondary | ICD-10-CM | POA: Diagnosis not present

## 2011-11-26 DIAGNOSIS — R609 Edema, unspecified: Secondary | ICD-10-CM | POA: Diagnosis not present

## 2011-11-26 DIAGNOSIS — W19XXXA Unspecified fall, initial encounter: Secondary | ICD-10-CM | POA: Diagnosis present

## 2011-11-26 DIAGNOSIS — E876 Hypokalemia: Secondary | ICD-10-CM | POA: Diagnosis present

## 2011-11-26 DIAGNOSIS — I252 Old myocardial infarction: Secondary | ICD-10-CM

## 2011-11-26 DIAGNOSIS — A419 Sepsis, unspecified organism: Secondary | ICD-10-CM | POA: Diagnosis not present

## 2011-11-26 DIAGNOSIS — I4891 Unspecified atrial fibrillation: Secondary | ICD-10-CM | POA: Diagnosis present

## 2011-11-26 DIAGNOSIS — R578 Other shock: Secondary | ICD-10-CM | POA: Diagnosis not present

## 2011-11-26 DIAGNOSIS — I959 Hypotension, unspecified: Secondary | ICD-10-CM | POA: Diagnosis not present

## 2011-11-26 DIAGNOSIS — Z7901 Long term (current) use of anticoagulants: Secondary | ICD-10-CM

## 2011-11-26 DIAGNOSIS — M4310 Spondylolisthesis, site unspecified: Secondary | ICD-10-CM | POA: Diagnosis not present

## 2011-11-26 DIAGNOSIS — Y998 Other external cause status: Secondary | ICD-10-CM

## 2011-11-26 DIAGNOSIS — L02619 Cutaneous abscess of unspecified foot: Secondary | ICD-10-CM | POA: Diagnosis not present

## 2011-11-26 DIAGNOSIS — I2510 Atherosclerotic heart disease of native coronary artery without angina pectoris: Secondary | ICD-10-CM | POA: Diagnosis present

## 2011-11-26 DIAGNOSIS — I10 Essential (primary) hypertension: Secondary | ICD-10-CM | POA: Diagnosis present

## 2011-11-26 DIAGNOSIS — R5381 Other malaise: Secondary | ICD-10-CM | POA: Diagnosis not present

## 2011-11-26 DIAGNOSIS — I517 Cardiomegaly: Secondary | ICD-10-CM | POA: Diagnosis not present

## 2011-11-26 DIAGNOSIS — Y92009 Unspecified place in unspecified non-institutional (private) residence as the place of occurrence of the external cause: Secondary | ICD-10-CM

## 2011-11-26 DIAGNOSIS — D649 Anemia, unspecified: Secondary | ICD-10-CM | POA: Diagnosis present

## 2011-11-26 DIAGNOSIS — E785 Hyperlipidemia, unspecified: Secondary | ICD-10-CM | POA: Diagnosis present

## 2011-11-26 DIAGNOSIS — G92 Toxic encephalopathy: Secondary | ICD-10-CM | POA: Diagnosis present

## 2011-11-26 DIAGNOSIS — I679 Cerebrovascular disease, unspecified: Secondary | ICD-10-CM | POA: Diagnosis not present

## 2011-11-26 DIAGNOSIS — R509 Fever, unspecified: Secondary | ICD-10-CM | POA: Diagnosis not present

## 2011-11-26 DIAGNOSIS — D696 Thrombocytopenia, unspecified: Secondary | ICD-10-CM | POA: Diagnosis present

## 2011-11-26 DIAGNOSIS — D638 Anemia in other chronic diseases classified elsewhere: Secondary | ICD-10-CM | POA: Diagnosis present

## 2011-11-26 DIAGNOSIS — Z8673 Personal history of transient ischemic attack (TIA), and cerebral infarction without residual deficits: Secondary | ICD-10-CM

## 2011-11-26 DIAGNOSIS — G929 Unspecified toxic encephalopathy: Secondary | ICD-10-CM | POA: Diagnosis present

## 2011-11-26 HISTORY — DX: Transient cerebral ischemic attack, unspecified: G45.9

## 2011-11-26 HISTORY — DX: Reserved for concepts with insufficient information to code with codable children: IMO0002

## 2011-11-26 LAB — CULTURE, BLOOD (ROUTINE X 2)
Culture  Setup Time: 201301030535
Culture: NO GROWTH

## 2011-11-26 LAB — COMPREHENSIVE METABOLIC PANEL
ALT: 15 U/L (ref 0–53)
AST: 29 U/L (ref 0–37)
Albumin: 3.1 g/dL — ABNORMAL LOW (ref 3.5–5.2)
CO2: 27 mEq/L (ref 19–32)
Calcium: 8.8 mg/dL (ref 8.4–10.5)
Creatinine, Ser: 0.86 mg/dL (ref 0.50–1.35)
GFR calc non Af Amer: 77 mL/min — ABNORMAL LOW (ref >90)
Sodium: 135 mEq/L (ref 135–145)
Total Protein: 5.7 g/dL — ABNORMAL LOW (ref 6.0–8.3)

## 2011-11-26 LAB — DIFFERENTIAL
Basophils Absolute: 0 10*3/uL (ref 0.0–0.1)
Basophils Relative: 0 % (ref 0–1)
Eosinophils Absolute: 0 10*3/uL (ref 0.0–0.7)
Monocytes Absolute: 0.3 10*3/uL (ref 0.1–1.0)
Monocytes Relative: 3 % (ref 3–12)
Neutro Abs: 11.4 10*3/uL — ABNORMAL HIGH (ref 1.7–7.7)
Neutrophils Relative %: 95 % — ABNORMAL HIGH (ref 43–77)

## 2011-11-26 LAB — URINALYSIS, ROUTINE W REFLEX MICROSCOPIC
Bilirubin Urine: NEGATIVE
Glucose, UA: NEGATIVE mg/dL
Hgb urine dipstick: NEGATIVE
Specific Gravity, Urine: 1.011 (ref 1.005–1.030)
Urobilinogen, UA: 0.2 mg/dL (ref 0.0–1.0)

## 2011-11-26 LAB — CBC
HCT: 32.1 % — ABNORMAL LOW (ref 39.0–52.0)
MCH: 33.5 pg (ref 26.0–34.0)
MCHC: 34.3 g/dL (ref 30.0–36.0)
RDW: 15.2 % (ref 11.5–15.5)

## 2011-11-26 LAB — PROTIME-INR: Prothrombin Time: 22.1 seconds — ABNORMAL HIGH (ref 11.6–15.2)

## 2011-11-26 LAB — GLUCOSE, CAPILLARY

## 2011-11-26 MED ORDER — ACETAMINOPHEN 500 MG PO TABS: 1000.0000 mg | ORAL_TABLET | Freq: Once | ORAL | Status: DC

## 2011-11-26 MED ORDER — SODIUM CHLORIDE 0.9 % IV BOLUS (SEPSIS): 1000.0000 mL | Freq: Once | INTRAVENOUS | Status: DC

## 2011-11-26 MED ORDER — SODIUM CHLORIDE 0.9 % IV BOLUS (SEPSIS)
1000.0000 mL | Freq: Once | INTRAVENOUS | Status: AC
  Administered 2011-11-26: 1000 mL via INTRAVENOUS

## 2011-11-26 MED ORDER — IBUPROFEN 200 MG PO TABS
600.0000 mg | ORAL_TABLET | Freq: Once | ORAL | Status: AC
  Administered 2011-11-26: 600 mg via ORAL
  Filled 2011-11-26: qty 3

## 2011-11-26 MED ORDER — ACETAMINOPHEN 650 MG RE SUPP
RECTAL | Status: AC
  Filled 2011-11-26: qty 1

## 2011-11-26 MED ORDER — ACETAMINOPHEN 325 MG RE SUPP
975.0000 mg | Freq: Once | RECTAL | Status: AC
  Administered 2011-11-26: 975 mg via RECTAL

## 2011-11-26 MED ORDER — POTASSIUM CHLORIDE 10 MEQ/100ML IV SOLN
10.0000 meq | INTRAVENOUS | Status: AC
  Administered 2011-11-26 – 2011-11-27 (×2): 10 meq via INTRAVENOUS
  Filled 2011-11-26 (×2): qty 100

## 2011-11-26 MED ORDER — SODIUM CHLORIDE 0.9 % IV SOLN
Freq: Once | INTRAVENOUS | Status: AC
  Administered 2011-11-26: 21:00:00 via INTRAVENOUS

## 2011-11-26 MED ORDER — VANCOMYCIN HCL IN DEXTROSE 1-5 GM/200ML-% IV SOLN
1000.0000 mg | Freq: Once | INTRAVENOUS | Status: AC
  Administered 2011-11-26: 1000 mg via INTRAVENOUS
  Filled 2011-11-26: qty 200

## 2011-11-26 MED ORDER — CLINDAMYCIN PHOSPHATE 600 MG/50ML IV SOLN
600.0000 mg | Freq: Once | INTRAVENOUS | Status: AC
  Administered 2011-11-26: 600 mg via INTRAVENOUS
  Filled 2011-11-26: qty 50

## 2011-11-26 MED ORDER — ACETAMINOPHEN 325 MG RE SUPP
RECTAL | Status: AC
  Filled 2011-11-26: qty 1

## 2011-11-26 NOTE — ED Notes (Signed)
Patient here from home, not acting himself per family members.  Patient is warm to the touch, only saying "yes" to any questions asked of him and eyes closed.  Patient earlier today per family and has not been the same since.  Patient is usually CAOx3 per family.

## 2011-11-26 NOTE — ED Notes (Signed)
[facility] CALLED 

## 2011-11-26 NOTE — ED Notes (Signed)
Patient fell after getting up.  Now wife states that he has not been acting himself, sitting in the chair, eyes closed.  Wife states that it has been going on all day.

## 2011-11-26 NOTE — ED Provider Notes (Signed)
History     CSN: [id number]  Arrival date & time 11/26/11  2002   First MD Initiated Contact with Patient 11/26/11 2007      Chief Complaint  Patient presents with  . Fall    (Consider location/radiation/quality/duration/timing/severity/associated sxs/prior treatment) HPI history from family Level V caveat for altered mental status  Patient is an 78 year old male with history of A. fib (on Coumadin) and RA who presents after fall at home. This occurred about 2 hours prior to arrival. The fall was unwitnessed. Family states that patient's wife found him on the ground. He is unclear about the events. They've noticed that he has been not acting himself today has been confused. There has been no focal weakness or sensation changes on exam. He has had generalized weakness. He also has been disoriented. At baseline he is alert and oriented to person place and time and carries on normal conversation. He also ambulates without assistance at baseline. Patient has had right lower extremity swelling and redness for the past week or 2. This is significantly worsened lately. He has had no recent cough. No history of urinary infections and has no urinary complaints. No recent nausea vomiting or diarrhea. Patient did get flu shot and has not had any sick contacts. Overall severity described as moderate to severe.   Past Medical History  Diagnosis Date  . Arrhythmia   . Carotid artery occlusion   . Coronary artery disease   . Hyperlipidemia   . Hypertension   . TIA (transient ischemic attack)     Past Surgical History  Procedure Date  . Cardiac catheterization     Family History  Problem Relation Age of Onset  . Heart disease Mother   . Malignant hyperthermia Mother   . Heart disease Father   . Malignant hyperthermia Father     History  Substance Use Topics  . Smoking status: Former Smoker    Types: Cigarettes  . Smokeless tobacco: Never Used   Comment: quit around 1992  . Alcohol  Use: No      Review of Systems  Unable to perform ROS   Allergies  Review of patient's allergies indicates no known allergies.  Home Medications   Current Outpatient Rx  Name Route Sig Dispense Refill  . ASPIRIN 81 MG PO TABS Oral Take 81 mg by mouth daily.      . ATORVASTATIN CALCIUM 10 MG PO TABS Oral Take 1 tablet (10 mg total) by mouth daily. 90 tablet 3  . CALTRATE 600 PLUS-VIT D PO Oral Take by mouth 1 day or 1 dose.     Marland Kitchen DIGOXIN 0.125 MG PO TABS Oral Take 1 tablet (125 mcg total) by mouth daily. 90 tablet 3  . HYDROCODONE-HOMATROPINE 5-1.5 MG/5ML PO SYRP Oral Take 5 mLs by mouth every 8 (eight) hours as needed for cough. 120 mL 1  . METHOTREXATE (ANTI-RHEUMATIC) 2.5 MG PO TABS Oral Take 2.5 mg by mouth 2 (two) times a week. Take 5 tabs Saturday and Sunday    . METOPROLOL SUCCINATE ER 50 MG PO TB24 Oral Take 1 tablet (50 mg total) by mouth 2 (two) times daily. 180 tablet 3  . CENTRUM SILVER PO Oral Take by mouth.      Marland Kitchen NIACIN ER (ANTIHYPERLIPIDEMIC) 1000 MG PO TBCR Oral Take 1 tablet (1,000 mg total) by mouth at bedtime. 2 tabs at bedtime 180 tablet 2    2 po qhs  . NITROGLYCERIN 0.4 MG SL SUBL Sublingual Place 0.4 mg  under the tongue every 5 (five) minutes as needed. For chest pain     . TRAMADOL HCL 50 MG PO TABS Oral Take 50 mg by mouth. Take one tab 2-3 times a day as needed     . WARFARIN SODIUM 5 MG PO TABS  Take as directed by Anticoagulation clinic  (Patient takes up to 1 tablet daily) 90 tablet 3    BP 76/39  Pulse 102  Temp(Src) 102 F (38.9 C) (Rectal)  Resp 15  SpO2 97%  Physical Exam  Nursing note and vitals reviewed. Constitutional: He appears well-nourished. No distress.       Appears moderately dehydrated.  HENT:  Head: Normocephalic and atraumatic.  Nose: Nose normal.  Eyes: EOM are normal. Pupils are equal, round, and reactive to light.  Neck: Normal range of motion. Neck supple. No JVD present.       No tenderness to palpation over C-spine.    Cardiovascular: Intact distal pulses.        Irregularly irregular, tachycardia  Pulmonary/Chest: Effort normal and breath sounds normal. No respiratory distress. He has no wheezes. He has no rales. He exhibits no tenderness.  Abdominal: Soft. Bowel sounds are normal. He exhibits no distension. There is no tenderness.  Musculoskeletal: Normal range of motion. He exhibits no tenderness.       Left lower extremity: 2+ pitting edema, atraumatic  Right lower extremity: Cellulitis over the tib-fib region. Warm to touch. No induration. 3+ pitting edema.  Neurological: No cranial nerve deficit.       Patient is alert but disoriented to time and place. He does not answer questions appropriately. He does follow commands on occasion. He is protecting his own airway.  Strength appears grossly normal in all extremities.  Skin: Skin is warm and dry. He is not diaphoretic.  Psychiatric: He has a normal mood and affect. His behavior is normal. Thought content normal.    ED Course  Procedures (including critical care time)  ECG on 11/26/2011 2020: Heart rate 117. Rhythm atrial fibrillation with RVR. Normal axis. No conduction delay. No hypertrophy. Mild ST depression in lateral leads. Nonspecific T wave changes. When compared to EKG from 03/30/2010, increased heart rate and new mild ST depression laterally..  Labs Reviewed  CBC - Abnormal; Notable for the following:    WBC 12.0 (*)    RBC 3.28 (*)    Hemoglobin 11.0 (*)    HCT 32.1 (*)    Platelets 131 (*)    All other components within normal limits  DIFFERENTIAL - Abnormal; Notable for the following:    Neutrophils Relative 95 (*)    Neutro Abs 11.4 (*)    Lymphocytes Relative 2 (*)    Lymphs Abs 0.3 (*)    All other components within normal limits  PROTIME-INR - Abnormal; Notable for the following:    Prothrombin Time 22.1 (*)    INR 1.90 (*)    All other components within normal limits  COMPREHENSIVE METABOLIC PANEL - Abnormal; Notable for  the following:    Potassium 3.3 (*)    Total Protein 5.7 (*)    Albumin 3.1 (*)    GFR calc non Af Amer 77 (*)    GFR calc Af Amer 90 (*)    All other components within normal limits  LACTIC ACID, PLASMA  TROPONIN I  URINALYSIS, ROUTINE W REFLEX MICROSCOPIC  GLUCOSE, CAPILLARY  POCT CBG MONITORING  URINE CULTURE  CULTURE, BLOOD (ROUTINE X 2)  CULTURE, BLOOD (ROUTINE  X 2)   Dg Tibia/fibula Right  11/26/2011  *RADIOLOGY REPORT*  Clinical Data: Cellulitis  RIGHT TIBIA AND FIBULA - 2 VIEW  Comparison: None  Findings: The right tibia and fibula appear intact.  No evidence of acute fracture or subluxation.  No focal bone erosion or sclerosis. No evidence of osteomyelitis.  No focal bone lesion or expansile change.  Bone cortex and trabecular architecture appear intact. Vascular calcifications and calcified phleboliths in the soft tissues.  No radiopaque foreign bodies.  No obvious subcutaneous gas collections.  Mild degenerative changes in the knee.  IMPRESSION: Right tibia and fibula appear intact without evidence of bone erosion or osteomyelitis.  Original Report Authenticated By: Marlon Pel, M.D.   Ct Head Wo Contrast  11/26/2011  *RADIOLOGY REPORT*  Clinical Data:  Altered mental status after fall this morning.  CT HEAD WITHOUT CONTRAST CT CERVICAL SPINE WITHOUT CONTRAST  Technique:  Multidetector CT imaging of the head and cervical spine was performed following the standard protocol without intravenous contrast.  Multiplanar CT image reconstructions of the cervical spine were also generated.  Comparison:  CT head 12/31/2008  CT HEAD  Findings: Diffuse bilateral cerebral and cerebellar atrophy.  Focal encephalomalacia in the right frontal lobe consistent with old infarct and stable since the prior study.  Ventricular dilatation consistent with central atrophy.  No mass effect or midline shift. No abnormal extra-axial fluid collections.  Gray-white matter junctions are distinct.  Basal  cisterns are not effaced.  No evidence of acute intracranial hemorrhage.  Vascular calcifications.  No depressed skull fractures.  Mild supraorbital and nasal soft tissue swelling.  Mild irregularity of the nasal bones appear stable suggesting old fractures.  Mild mucosal membrane thickening in the paranasal sinuses.  No acute air-fluid levels are appreciated.  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  The no evidence of acute intracranial hemorrhage, mass lesion, or acute infarct.  CT CERVICAL SPINE  Findings: Slight retrolisthesis of left lateral mass of C1 with respect to C2, likely degenerative.  Otherwise normal alignment of the cervical vertebrae and facet joints.  Fusion of the C2-3 facet joint on the left which may be congenital or degenerative. Extensive degenerative changes throughout the facet joints bilaterally.  Degenerative changes throughout the cervical spine with narrowed cervical interspaces and associated endplate hypertrophic changes.  Prominent posterior disc osteophyte complexes at C3-4, C5-6, and C6-7 levels.  Degenerative changes and bone erosion involving C1-2.  No significant vertebral compression deformity.  No prevertebral soft tissue swelling.  No significant infiltration into the paraspinal soft tissues.  Vascular calcifications in the carotid and vertebral arteries.  IMPRESSION: Diffuse degenerative change throughout the cervical spine with mild retrolisthesis of the left lateral mass of C1 with respect C2, likely degenerative.  No displaced fractures are identified.  Original Report Authenticated By: Marlon Pel, M.D.   Ct Cervical Spine Wo Contrast  11/26/2011  *RADIOLOGY REPORT*  Clinical Data:  Altered mental status after fall this morning.  CT HEAD WITHOUT CONTRAST CT CERVICAL SPINE WITHOUT CONTRAST  Technique:  Multidetector CT imaging of the head and cervical spine was performed following the standard protocol without  intravenous contrast.  Multiplanar CT image reconstructions of the cervical spine were also generated.  Comparison:  CT head 12/31/2008  CT HEAD  Findings: Diffuse bilateral cerebral and cerebellar atrophy.  Focal encephalomalacia in the right frontal lobe consistent with old infarct and stable since the prior study.  Ventricular dilatation consistent with central  atrophy.  No mass effect or midline shift. No abnormal extra-axial fluid collections.  Gray-white matter junctions are distinct.  Basal cisterns are not effaced.  No evidence of acute intracranial hemorrhage.  Vascular calcifications.  No depressed skull fractures.  Mild supraorbital and nasal soft tissue swelling.  Mild irregularity of the nasal bones appear stable suggesting old fractures.  Mild mucosal membrane thickening in the paranasal sinuses.  No acute air-fluid levels are appreciated.  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  The no evidence of acute intracranial hemorrhage, mass lesion, or acute infarct.  CT CERVICAL SPINE  Findings: Slight retrolisthesis of left lateral mass of C1 with respect to C2, likely degenerative.  Otherwise normal alignment of the cervical vertebrae and facet joints.  Fusion of the C2-3 facet joint on the left which may be congenital or degenerative. Extensive degenerative changes throughout the facet joints bilaterally.  Degenerative changes throughout the cervical spine with narrowed cervical interspaces and associated endplate hypertrophic changes.  Prominent posterior disc osteophyte complexes at C3-4, C5-6, and C6-7 levels.  Degenerative changes and bone erosion involving C1-2.  No significant vertebral compression deformity.  No prevertebral soft tissue swelling.  No significant infiltration into the paraspinal soft tissues.  Vascular calcifications in the carotid and vertebral arteries.  IMPRESSION: Diffuse degenerative change throughout the cervical spine with  mild retrolisthesis of the left lateral mass of C1 with respect C2, likely degenerative.  No displaced fractures are identified.  Original Report Authenticated By: Marlon Pel, M.D.   Dg Chest Portable 1 View  11/26/2011  *RADIOLOGY REPORT*  Clinical Data: Fever  PORTABLE CHEST - 1 VIEW  Comparison: 03/30/2010  Findings: Slightly shallow inspiration.  Heart size and pulmonary vascularity are mildly increased.  Findings may suggest early congestive change although some of this could be attributed to technique.  No perihilar edema.  No focal airspace consolidation in the lungs.  No blunting of costophrenic angles.  No pneumothorax.  IMPRESSION: Mild cardiac enlargement and pulmonary vascular prominence.  No consolidation or edema.  Original Report Authenticated By: Marlon Pel, M.D.     1. Sepsis   2. Cellulitis and abscess of leg, except foot   3. Encephalopathy, toxic   4. Hypokalemia   5. Atrial fibrillation with RVR       MDM   Patient is here with altered mental status, recent fall. On his initial exam he is febrile and in A. fib with RVR. He appears moderately dehydrated. He is alert but not answering questions appropriately. His right lower extremity has cellulitis over the tib-fib region. There is no underlying crepitus. Patient's neurologic exam is nonfocal. Will evaluate for other sources of infection but most likely situation is cellulitis with associated dehydration and fever resulting in altered normal status. We will hydrate patient and control temperature in order to bring down heart rate.  Clinical picture is consistent with sepsis secondary to right lower extremity cellulitis. Patient does not have any crepitus nor gas on x-ray. No evidence of necrotizing infection. Patient is febrile and tachycardic. He also has altered mental status. Antibiotics given for cellulitis. Urinalysis and chest x-ray negative. Patient also hypokalemic with normal renal function, potassium  replaced. While patient was in the emergency department, he gradually had decrease in his blood pressure with low systolics and 80 range. No significant change in his mental status at that time but he is still mildly confused with acute fever. Patient responded appropriately to IV fluids with  systolic pressures in 90s to 100s.Marland Kitchen His heart rate also decreased with IV fluids. As the patient's blood pressure stabilized and and he defervesced, mental status much improved. Case discussed with internal medicine will admit to step down unit.       Milus Glazier 11/27/11 [phone]

## 2011-11-27 ENCOUNTER — Encounter (HOSPITAL_COMMUNITY): Payer: Self-pay | Admitting: Internal Medicine

## 2011-11-27 ENCOUNTER — Inpatient Hospital Stay (HOSPITAL_COMMUNITY): Payer: Medicare Other

## 2011-11-27 DIAGNOSIS — D696 Thrombocytopenia, unspecified: Secondary | ICD-10-CM | POA: Diagnosis not present

## 2011-11-27 DIAGNOSIS — J9819 Other pulmonary collapse: Secondary | ICD-10-CM | POA: Diagnosis not present

## 2011-11-27 DIAGNOSIS — A419 Sepsis, unspecified organism: Secondary | ICD-10-CM | POA: Diagnosis present

## 2011-11-27 DIAGNOSIS — E785 Hyperlipidemia, unspecified: Secondary | ICD-10-CM | POA: Diagnosis present

## 2011-11-27 DIAGNOSIS — I10 Essential (primary) hypertension: Secondary | ICD-10-CM | POA: Diagnosis present

## 2011-11-27 DIAGNOSIS — L0390 Cellulitis, unspecified: Secondary | ICD-10-CM

## 2011-11-27 DIAGNOSIS — L0291 Cutaneous abscess, unspecified: Secondary | ICD-10-CM

## 2011-11-27 DIAGNOSIS — M069 Rheumatoid arthritis, unspecified: Secondary | ICD-10-CM | POA: Diagnosis not present

## 2011-11-27 DIAGNOSIS — M4310 Spondylolisthesis, site unspecified: Secondary | ICD-10-CM | POA: Diagnosis not present

## 2011-11-27 DIAGNOSIS — J90 Pleural effusion, not elsewhere classified: Secondary | ICD-10-CM | POA: Diagnosis not present

## 2011-11-27 DIAGNOSIS — J984 Other disorders of lung: Secondary | ICD-10-CM | POA: Diagnosis not present

## 2011-11-27 DIAGNOSIS — L02419 Cutaneous abscess of limb, unspecified: Secondary | ICD-10-CM | POA: Diagnosis not present

## 2011-11-27 DIAGNOSIS — L02619 Cutaneous abscess of unspecified foot: Secondary | ICD-10-CM | POA: Diagnosis not present

## 2011-11-27 DIAGNOSIS — Z7982 Long term (current) use of aspirin: Secondary | ICD-10-CM | POA: Diagnosis not present

## 2011-11-27 DIAGNOSIS — I2510 Atherosclerotic heart disease of native coronary artery without angina pectoris: Secondary | ICD-10-CM | POA: Diagnosis not present

## 2011-11-27 DIAGNOSIS — D638 Anemia in other chronic diseases classified elsewhere: Secondary | ICD-10-CM | POA: Diagnosis present

## 2011-11-27 DIAGNOSIS — I959 Hypotension, unspecified: Secondary | ICD-10-CM | POA: Diagnosis not present

## 2011-11-27 DIAGNOSIS — Z09 Encounter for follow-up examination after completed treatment for conditions other than malignant neoplasm: Secondary | ICD-10-CM | POA: Diagnosis not present

## 2011-11-27 DIAGNOSIS — Z8673 Personal history of transient ischemic attack (TIA), and cerebral infarction without residual deficits: Secondary | ICD-10-CM | POA: Diagnosis not present

## 2011-11-27 DIAGNOSIS — I252 Old myocardial infarction: Secondary | ICD-10-CM | POA: Diagnosis not present

## 2011-11-27 DIAGNOSIS — E876 Hypokalemia: Secondary | ICD-10-CM | POA: Diagnosis present

## 2011-11-27 DIAGNOSIS — Z7901 Long term (current) use of anticoagulants: Secondary | ICD-10-CM | POA: Diagnosis not present

## 2011-11-27 DIAGNOSIS — L03119 Cellulitis of unspecified part of limb: Secondary | ICD-10-CM | POA: Diagnosis not present

## 2011-11-27 DIAGNOSIS — R6521 Severe sepsis with septic shock: Secondary | ICD-10-CM | POA: Diagnosis not present

## 2011-11-27 DIAGNOSIS — Z87891 Personal history of nicotine dependence: Secondary | ICD-10-CM | POA: Diagnosis not present

## 2011-11-27 DIAGNOSIS — G92 Toxic encephalopathy: Secondary | ICD-10-CM | POA: Diagnosis present

## 2011-11-27 DIAGNOSIS — I4891 Unspecified atrial fibrillation: Secondary | ICD-10-CM | POA: Diagnosis present

## 2011-11-27 DIAGNOSIS — M7989 Other specified soft tissue disorders: Secondary | ICD-10-CM | POA: Diagnosis not present

## 2011-11-27 LAB — CARBOXYHEMOGLOBIN: Methemoglobin: 0.4 % (ref 0.0–1.5)

## 2011-11-27 LAB — CBC
HCT: 31.6 % — ABNORMAL LOW (ref 39.0–52.0)
Hemoglobin: 10.7 g/dL — ABNORMAL LOW (ref 13.0–17.0)
MCH: 32.9 pg (ref 26.0–34.0)
MCHC: 33.9 g/dL (ref 30.0–36.0)
RDW: 15.4 % (ref 11.5–15.5)

## 2011-11-27 LAB — BASIC METABOLIC PANEL
CO2: 20 mEq/L (ref 19–32)
GFR calc Af Amer: >90 mL/min (ref >90)
Glucose, Bld: 117 mg/dL — ABNORMAL HIGH (ref 70–99)

## 2011-11-27 LAB — URINE CULTURE
Colony Count: NO GROWTH
Culture  Setup Time: 201301022122
Culture: NO GROWTH

## 2011-11-27 LAB — GLUCOSE, CAPILLARY
Glucose-Capillary: 117 mg/dL — ABNORMAL HIGH (ref 70–99)
Glucose-Capillary: 120 mg/dL — ABNORMAL HIGH (ref 70–99)
Glucose-Capillary: 112 mg/dL — ABNORMAL HIGH (ref 70–99)
Glucose-Capillary: 116 mg/dL — ABNORMAL HIGH (ref 70–99)
Glucose-Capillary: 122 mg/dL — ABNORMAL HIGH (ref 70–99)
Glucose-Capillary: 109 mg/dL — ABNORMAL HIGH (ref 70–99)
Glucose-Capillary: 121 mg/dL — ABNORMAL HIGH (ref 70–99)

## 2011-11-27 LAB — DIFFERENTIAL
Basophils Absolute: 0 10*3/uL (ref 0.0–0.1)
Basophils Relative: 0 % (ref 0–1)
Eosinophils Absolute: 0 10*3/uL (ref 0.0–0.7)
Lymphs Abs: 0.6 10*3/uL — ABNORMAL LOW (ref 0.7–4.0)
Monocytes Absolute: 1.8 10*3/uL — ABNORMAL HIGH (ref 0.1–1.0)
Neutro Abs: 27.7 10*3/uL — ABNORMAL HIGH (ref 1.7–7.7)

## 2011-11-27 LAB — PROCALCITONIN
Procalcitonin: 30.5 ng/mL
Procalcitonin: 30.4 ng/mL

## 2011-11-27 LAB — MRSA PCR SCREENING: MRSA by PCR: NEGATIVE

## 2011-11-27 LAB — DIGOXIN LEVEL: Digoxin Level: 0.4 ng/mL — ABNORMAL LOW (ref 0.8–2.0)

## 2011-11-27 MED ORDER — COLLAGENASE 250 UNIT/GM EX OINT
TOPICAL_OINTMENT | Freq: Every day | CUTANEOUS | Status: DC
  Administered 2011-11-27 – 2011-12-03 (×7): via TOPICAL
  Filled 2011-11-27: qty 30

## 2011-11-27 MED ORDER — NIACIN ER (ANTIHYPERLIPIDEMIC) 500 MG PO TBCR
1000.0000 mg | EXTENDED_RELEASE_TABLET | Freq: Every day | ORAL | Status: DC
  Administered 2011-11-27 – 2011-12-02 (×6): 1000 mg via ORAL
  Filled 2011-11-27 (×7): qty 2

## 2011-11-27 MED ORDER — HYDROCERIN EX CREA
TOPICAL_CREAM | Freq: Two times a day (BID) | CUTANEOUS | Status: DC
  Administered 2011-11-27 (×2): via TOPICAL
  Administered 2011-11-28: 1 via TOPICAL
  Administered 2011-11-28 – 2011-12-03 (×10): via TOPICAL
  Filled 2011-11-27: qty 113

## 2011-11-27 MED ORDER — DIGOXIN 125 MCG PO TABS
125.0000 ug | ORAL_TABLET | Freq: Every day | ORAL | Status: DC
  Filled 2011-11-27: qty 1

## 2011-11-27 MED ORDER — HYDROCODONE-HOMATROPINE 5-1.5 MG/5ML PO SYRP: 5.0000 mL | ORAL_SOLUTION | Freq: Three times a day (TID) | ORAL | Status: DC | PRN

## 2011-11-27 MED ORDER — METOPROLOL TARTRATE 1 MG/ML IV SOLN
2.5000 mg | INTRAVENOUS | Status: DC | PRN
  Administered 2011-11-28 (×3): 5 mg via INTRAVENOUS
  Administered 2011-11-29: 2.5 mg via INTRAVENOUS
  Filled 2011-11-27: qty 5
  Filled 2011-11-27: qty 25
  Filled 2011-11-27: qty 5

## 2011-11-27 MED ORDER — ONDANSETRON HCL 4 MG/2ML IJ SOLN: 4.0000 mg | Freq: Four times a day (QID) | INTRAMUSCULAR | Status: DC | PRN

## 2011-11-27 MED ORDER — INSULIN ASPART 100 UNIT/ML ~~LOC~~ SOLN
0.0000 [IU] | Freq: Three times a day (TID) | SUBCUTANEOUS | Status: DC
  Filled 2011-11-27: qty 3

## 2011-11-27 MED ORDER — NITROGLYCERIN 0.4 MG SL SUBL: 0.4000 mg | SUBLINGUAL_TABLET | SUBLINGUAL | Status: DC | PRN

## 2011-11-27 MED ORDER — NOREPINEPHRINE BITARTRATE 1 MG/ML IJ SOLN
2.0000 ug/min | INTRAVENOUS | Status: DC | PRN
  Administered 2011-11-27: 24 ug/min via INTRAVENOUS
  Filled 2011-11-27 (×3): qty 4

## 2011-11-27 MED ORDER — SODIUM CHLORIDE 0.9 % IV SOLN
INTRAVENOUS | Status: DC
  Administered 2011-11-27: 125 mL/h via INTRAVENOUS
  Administered 2011-11-27: 17:00:00 via INTRAVENOUS
  Administered 2011-11-28: 125 mL/h via INTRAVENOUS

## 2011-11-27 MED ORDER — DEXTROSE 10 % IV SOLN: INTRAVENOUS | Status: DC

## 2011-11-27 MED ORDER — VANCOMYCIN HCL 1000 MG IV SOLR
1250.0000 mg | Freq: Two times a day (BID) | INTRAVENOUS | Status: DC
  Administered 2011-11-27: 1250 mg via INTRAVENOUS
  Filled 2011-11-27 (×2): qty 1250

## 2011-11-27 MED ORDER — METHOTREXATE (ANTI-RHEUMATIC) 2.5 MG PO TABS
2.5000 mg | ORAL_TABLET | ORAL | Status: DC
  Filled 2011-11-27: qty 1

## 2011-11-27 MED ORDER — PIPERACILLIN-TAZOBACTAM 3.375 G IVPB
3.3750 g | Freq: Three times a day (TID) | INTRAVENOUS | Status: DC
  Filled 2011-11-27 (×2): qty 50

## 2011-11-27 MED ORDER — NOREPINEPHRINE BITARTRATE 1 MG/ML IJ SOLN
2.0000 ug/min | INTRAVENOUS | Status: AC
  Administered 2011-11-27: 20 ug/min via INTRAVENOUS
  Filled 2011-11-27: qty 4

## 2011-11-27 MED ORDER — VANCOMYCIN HCL IN DEXTROSE 1-5 GM/200ML-% IV SOLN
1000.0000 mg | Freq: Two times a day (BID) | INTRAVENOUS | Status: DC
  Administered 2011-11-27 – 2011-12-03 (×12): 1000 mg via INTRAVENOUS
  Filled 2011-11-27 (×14): qty 200

## 2011-11-27 MED ORDER — SODIUM CHLORIDE 0.9 % IV SOLN
3.0000 g | Freq: Four times a day (QID) | INTRAVENOUS | Status: DC
  Administered 2011-11-27 – 2011-11-28 (×6): 3 g via INTRAVENOUS
  Administered 2011-11-28: 17:00:00 via INTRAVENOUS
  Administered 2011-11-28 – 2011-12-03 (×19): 3 g via INTRAVENOUS
  Filled 2011-11-27 (×30): qty 3

## 2011-11-27 MED ORDER — POTASSIUM CHLORIDE CRYS ER 20 MEQ PO TBCR
40.0000 meq | EXTENDED_RELEASE_TABLET | Freq: Once | ORAL | Status: AC
  Administered 2011-11-27: 40 meq via ORAL
  Filled 2011-11-27: qty 2

## 2011-11-27 MED ORDER — DOBUTAMINE IN D5W 4-5 MG/ML-% IV SOLN: 2.5000 ug/kg/min | INTRAVENOUS | Status: DC | PRN

## 2011-11-27 MED ORDER — NOREPINEPHRINE BITARTRATE 1 MG/ML IJ SOLN: 2.0000 ug/min | INTRAVENOUS | Status: DC

## 2011-11-27 MED ORDER — INSULIN ASPART 100 UNIT/ML ~~LOC~~ SOLN
3.0000 [IU] | SUBCUTANEOUS | Status: DC
  Filled 2011-11-27: qty 3

## 2011-11-27 MED ORDER — INSULIN ASPART 100 UNIT/ML ~~LOC~~ SOLN: 0.0000 [IU] | SUBCUTANEOUS | Status: DC

## 2011-11-27 MED ORDER — ASPIRIN 81 MG PO CHEW
81.0000 mg | CHEWABLE_TABLET | Freq: Every day | ORAL | Status: DC
  Administered 2011-11-27 – 2011-12-03 (×7): 81 mg via ORAL
  Filled 2011-11-27 (×8): qty 1

## 2011-11-27 MED ORDER — SODIUM CHLORIDE 0.9 % IV BOLUS (SEPSIS): 500.0000 mL | INTRAVENOUS | Status: DC | PRN

## 2011-11-27 MED ORDER — METOPROLOL TARTRATE 1 MG/ML IV SOLN
INTRAVENOUS | Status: AC
  Filled 2011-11-27: qty 5

## 2011-11-27 MED ORDER — SIMVASTATIN 20 MG PO TABS
20.0000 mg | ORAL_TABLET | Freq: Every day | ORAL | Status: DC
  Administered 2011-11-27 – 2011-12-03 (×7): 20 mg via ORAL
  Filled 2011-11-27 (×7): qty 1

## 2011-11-27 MED ORDER — SODIUM CHLORIDE 0.9 % IV BOLUS (SEPSIS): 500.0000 mL | Freq: Once | INTRAVENOUS | Status: DC

## 2011-11-27 MED ORDER — TRAMADOL HCL 50 MG PO TABS
50.0000 mg | ORAL_TABLET | Freq: Four times a day (QID) | ORAL | Status: DC | PRN
  Filled 2011-11-27: qty 1

## 2011-11-27 MED ORDER — POTASSIUM CHLORIDE CRYS ER 20 MEQ PO TBCR
40.0000 meq | EXTENDED_RELEASE_TABLET | Freq: Once | ORAL | Status: AC
  Administered 2011-11-27: 40 meq via ORAL
  Filled 2011-11-27: qty 1

## 2011-11-27 MED ORDER — INSULIN ASPART 100 UNIT/ML ~~LOC~~ SOLN
0.0000 [IU] | Freq: Every day | SUBCUTANEOUS | Status: DC
  Filled 2011-11-27: qty 3

## 2011-11-27 MED ORDER — ONDANSETRON HCL 4 MG PO TABS: 4.0000 mg | ORAL_TABLET | Freq: Four times a day (QID) | ORAL | Status: DC | PRN

## 2011-11-27 MED ORDER — METHOTREXATE 2.5 MG PO TABS: 12.5000 mg | ORAL_TABLET | ORAL | Status: DC

## 2011-11-27 MED ORDER — NOREPINEPHRINE BITARTRATE 1 MG/ML IJ SOLN
2.0000 ug/min | INTRAVENOUS | Status: DC | PRN
  Filled 2011-11-27: qty 4

## 2011-11-27 MED ORDER — WARFARIN SODIUM 5 MG PO TABS
5.0000 mg | ORAL_TABLET | Freq: Once | ORAL | Status: AC
  Administered 2011-11-27: 5 mg via ORAL
  Filled 2011-11-27: qty 1

## 2011-11-27 MED ORDER — SODIUM CHLORIDE 0.9 % IV SOLN
Freq: Once | INTRAVENOUS | Status: AC
  Administered 2011-11-27: via INTRAVENOUS

## 2011-11-27 MED ORDER — SODIUM CHLORIDE 0.9 % IJ SOLN
INTRAMUSCULAR | Status: AC
  Filled 2011-11-27: qty 10

## 2011-11-27 NOTE — Consult Note (Signed)
WOC consult Note Reason for Consult: req to eval pt for wound care for LE ulcerations.  Pt poor historian but does report that he "bumped" LLE at some point, has trauma wound to this area covered with eschar.  Has two linear areas to RLE that appear as well to be trauma, mostly closed with scant drainage. One larger area on RLE covered with eschar as well.  Bil LE edema 1+ and noted very dry scaly skin to all involved areas and feet.    Wound type: trauma wounds bil. LE secondary to falls  Measurement:RLE 2 linear areas aprox. 8cm x 1.0cmx 0 cm, area of eschar 2.0cm x 3.0cm x 0   LLE eschar covered ulcer 2cm x 2cmx 0cm Wound bed:2 noted areas of necrotic tissue, with linear areas pink and re epithelialized  Drainage (amount, consistency, odor) scant, no odor noted from any of the lesions  Periwound:LLE intact without problems except for dry skin, RLE intact with noted erythema that has improved based on markings from ER.  Dressing procedure/placement/frequency: bedside RN to obtain wound culture of scant drop of drainage noted from RLE, however may not be definitive since pt has no real open wounds to cx.  Will order Eucerin to bil. LE to treat dry skin and keep legs moist to lessen likelihood for cracking open.  Will tx linear areas with silicone foam dressing to protect, insulate and provide moist wound healing.  Two areas of eschar will order enzymatic debridement agent to clean off non viable tissue then could convert to use of foam once this tissue has been removed.   Re consult if needed, will not follow at this time. Thanks  Marleni Gallardo [employer], CWOCN [phone])

## 2011-11-27 NOTE — Progress Notes (Signed)
  Echocardiogram 2D Echocardiogram has been performed.  [name] [name] Nira Retort 11/27/2011, 10:50 AM

## 2011-11-27 NOTE — ED Notes (Signed)
[facility] called.  Patient to floor via RN and tech.  Dr Bard Herbert to see when patient goes to floor.

## 2011-11-27 NOTE — Procedures (Signed)
Central Venous Catheter Insertion Procedure Note [name] [name] [id number] 1924-12-15  Procedure: Insertion of Central Venous Catheter Indications: Assessment of intravascular volume and Drug and/or fluid administration  Procedure Details Consent: Risks of procedure as well as the alternatives and risks of each were explained to the (patient/caregiver).  Consent for procedure obtained. Time Out: Verified patient identification, verified procedure, site/side was marked, verified correct patient position, special equipment/implants available, medications/allergies/relevent history reviewed, required imaging and test results available.  Performed  Maximum sterile technique was used including antiseptics, cap, gloves, gown, hand hygiene, mask and sheet. Skin prep: Chlorhexidine; local anesthetic administered A antimicrobial bonded/coated triple lumen catheter was placed in the right subclavian vein using the Seldinger technique.   Note, attempted to place in R IJ, but could not pass wire despite good sized target on ultrasound imaging.  Changed to subclavian approach which he tolerated well.  Evaluation Blood flow good Complications: No apparent complications Patient did tolerate procedure well. Chest X-ray ordered to verify placement.  CXR: normal.  [name] [name] 11/27/2011, 3:38 AM  ;

## 2011-11-27 NOTE — Progress Notes (Signed)
Spoke with physician regarding restarting digoxin for pt. Physician does not wish to restart at this time.

## 2011-11-27 NOTE — Progress Notes (Signed)
ANTIBIOTIC CONSULT NOTE - FOLLOW UP  Pharmacy Consult for vancomycin, zosyn, coumadin  Indication: sepsis, afib    No Known Allergies  Patient Measurements:   Adjusted Body Weight:   Vital Signs: Temp: 98.6 F (37 C) (01/03 0257) Temp src: Oral (01/03 0257) BP: 109/55 mmHg (01/03 0215) Pulse Rate: 92  (01/03 0215) Intake/Output from previous day: 01/02 0701 - 01/03 0700 In: 5000 [I.V.:5000] Out: -  Intake/Output from this shift: Total I/O In: 5000 [I.V.:5000] Out: -   Labs:  Basename 11/26/11 2223 11/26/11 2207  WBC -- 12.0*  HGB -- 11.0*  PLT -- 131*  LABCREA -- --  CREATININE 0.86 --   The CrCl is unknown because both a height and weight (above a minimum accepted value) are required for this calculation. No results found for this basename: VANCOTROUGH:2,VANCOPEAK:2,VANCORANDOM:2,GENTTROUGH:2,GENTPEAK:2,GENTRANDOM:2,TOBRATROUGH:2,TOBRAPEAK:2,TOBRARND:2,AMIKACINPEAK:2,AMIKACINTROU:2,AMIKACIN:2, in the last 72 hours   Microbiology: No results found for this or any previous visit (from the past 720 hour(s)).  Anti-infectives     Start     Dose/Rate Route Frequency Ordered Stop   11/26/11 2345   vancomycin (VANCOCIN) IVPB 1000 mg/200 mL premix        1,000 mg 200 mL/hr over 60 Minutes Intravenous  Once 11/26/11 2336 11/27/11 0045   11/26/11 2230   clindamycin (CLEOCIN) IVPB 600 mg        600 mg 100 mL/hr over 30 Minutes Intravenous  Once 11/26/11 2223 11/26/11 2336          Assessment: Sepsis likely from cellulitis.Hypotensive with fever >104.  INR 1.9 for hx afib. Pt had a fall at home and has progressively declined over the week.   Goal of Therapy:  Vancomycin trough level 15-20 mcg/ml  Plan: vancomycin 1250mg  q12h zosyn 3.375 gm q8h  Coumadin 5mg x1 at 1800 today.  Daily inr/pt   vancomcyin trough at 4th dose.   Janice Coffin 11/27/2011,3:15 AM

## 2011-11-27 NOTE — H&P (Signed)
[name] [name] is an 78 y.o. male.   Chief Complaint: fall and weakness HPI: 79 YO man with history of Afib and RA here with fall at home and AMS. Was found on the floor by his wife. He has been gradually declining over the last week. Has right lower extremity swelling, redness which has also progressively gotten worse. He has had a fever as high as 104 today. No chills. He has generally declined. Normally able to move around with minimal support. Patient is weak and lethergic but able to respond to questions and carry on with conversation.  Past Medical History  Diagnosis Date  . Arrhythmia   . Carotid artery occlusion   . Coronary artery disease   . Hyperlipidemia   . Hypertension   . TIA (transient ischemic attack)     Past Surgical History  Procedure Date  . Cardiac catheterization     Family History  Problem Relation Age of Onset  . Heart disease Mother   . Malignant hyperthermia Mother   . Heart disease Father   . Malignant hyperthermia Father    Social History:  reports that he has quit smoking. His smoking use included Cigarettes. He has never used smokeless tobacco. He reports that he does not drink alcohol or use illicit drugs.  Allergies: No Known Allergies  Medications Prior to Admission  Medication Dose Route Frequency Provider Last Rate Last Dose  . 0.9 %  sodium chloride infusion   Intravenous Once [employer]      . 0.9 %  sodium chloride infusion   Intravenous Once Milus Glazier 999 mL/hr at 11/27/11 0005    . acetaminophen (TYLENOL) 650 MG suppository           . acetaminophen (TYLENOL) suppository 975 mg  975 mg Rectal Once Nelia Shi, MD   975 mg at 11/26/11 2031  . clindamycin (CLEOCIN) IVPB 600 mg  600 mg Intravenous Once [employer]   600 mg at 11/26/11 2306  . ibuprofen (ADVIL,MOTRIN) tablet 600 mg  600 mg Oral Once [employer]   600 mg at 11/26/11 2319  . potassium chloride 10 mEq in 100 mL IVPB  10 mEq Intravenous Q1 Hr x 2 Mike  Schinlever   10 mEq at 11/26/11 2344  . sodium chloride 0.9 % bolus 1,000 mL  1,000 mL Intravenous Once [employer]   1,000 mL at 11/26/11 2138  . sodium chloride 0.9 % bolus 1,000 mL  1,000 mL Intravenous Once [employer]   1,000 mL at 11/26/11 2308  . vancomycin (VANCOCIN) IVPB 1000 mg/200 mL premix  1,000 mg Intravenous Once [employer]   1,000 mg at 11/26/11 2345  . DISCONTD: acetaminophen (TYLENOL) 325 MG suppository           . DISCONTD: acetaminophen (TYLENOL) tablet 1,000 mg  1,000 mg Oral Once Nelia Shi, MD      . DISCONTD: sodium chloride 0.9 % bolus 1,000 mL  1,000 mL Intravenous Once Milus Glazier       Medications Prior to Admission  Medication Sig Dispense Refill  . aspirin 81 MG tablet Take 81 mg by mouth daily.        Marland Kitchen atorvastatin (LIPITOR) 10 MG tablet Take 1 tablet (10 mg total) by mouth daily.  90 tablet  3  . Calcium-Vitamin D (CALTRATE 600 PLUS-VIT D PO) Take by mouth 1 day or 1 dose.       . digoxin (LANOXIN) 0.125 MG tablet Take 1 tablet (  125 mcg total) by mouth daily.  90 tablet  3  . HYDROcodone-homatropine (HYCODAN) 5-1.5 MG/5ML syrup Take 5 mLs by mouth every 8 (eight) hours as needed for cough.  120 mL  1  . methotrexate (RHEUMATREX) 2.5 MG tablet Take 2.5 mg by mouth 2 (two) times a week. Take 5 tabs Saturday and Sunday      . metoprolol (TOPROL-XL) 50 MG 24 hr tablet Take 1 tablet (50 mg total) by mouth 2 (two) times daily.  180 tablet  3  . Multiple Vitamins-Minerals (CENTRUM SILVER PO) Take by mouth.        . niacin (NIASPAN) 1000 MG CR tablet Take 1 tablet (1,000 mg total) by mouth at bedtime. 2 tabs at bedtime  180 tablet  2  . nitroGLYCERIN (NITROSTAT) 0.4 MG SL tablet Place 0.4 mg under the tongue every 5 (five) minutes as needed. For chest pain       . traMADol (ULTRAM) 50 MG tablet Take 50 mg by mouth. Take one tab 2-3 times a day as needed       . warfarin (COUMADIN) 5 MG tablet Take as directed by Anticoagulation clinic  (Patient  takes up to 1 tablet daily)  90 tablet  3    Results for orders placed during the hospital encounter of 11/26/11 (from the past 48 hour(s))  URINALYSIS, ROUTINE W REFLEX MICROSCOPIC     Status: Normal   Collection Time   11/26/11  9:06 PM      Component Value Range Comment   Color, Urine YELLOW  YELLOW     APPearance CLEAR  CLEAR     Specific Gravity, Urine 1.011  1.005 - 1.030     pH 8.0  5.0 - 8.0     Glucose, UA NEGATIVE  NEGATIVE (mg/dL)    Hgb urine dipstick NEGATIVE  NEGATIVE     Bilirubin Urine NEGATIVE  NEGATIVE     Ketones, ur NEGATIVE  NEGATIVE (mg/dL)    Protein, ur NEGATIVE  NEGATIVE (mg/dL)    Urobilinogen, UA 0.2  0.0 - 1.0 (mg/dL)    Nitrite NEGATIVE  NEGATIVE     Leukocytes, UA NEGATIVE  NEGATIVE  MICROSCOPIC NOT DONE ON URINES WITH NEGATIVE PROTEIN, BLOOD, LEUKOCYTES, NITRITE, OR GLUCOSE <1000 mg/dL.  GLUCOSE, CAPILLARY     Status: Normal   Collection Time   11/26/11  9:08 PM      Component Value Range Comment   Glucose-Capillary 90  70 - 99 (mg/dL)    Comment 1 Notify RN      Comment 2 Documented in Chart     CBC     Status: Abnormal   Collection Time   11/26/11 10:07 PM      Component Value Range Comment   WBC 12.0 (*) 4.0 - 10.5 (K/uL)    RBC 3.28 (*) 4.22 - 5.81 (MIL/uL)    Hemoglobin 11.0 (*) 13.0 - 17.0 (g/dL)    HCT 16.1 (*) 09.6 - 52.0 (%)    MCV 97.9  78.0 - 100.0 (fL)    MCH 33.5  26.0 - 34.0 (pg)    MCHC 34.3  30.0 - 36.0 (g/dL)    RDW 04.5  40.9 - 81.1 (%)    Platelets 131 (*) 150 - 400 (K/uL)   DIFFERENTIAL     Status: Abnormal   Collection Time   11/26/11 10:07 PM      Component Value Range Comment   Neutrophils Relative 95 (*) 43 - 77 (%)  Neutro Abs 11.4 (*) 1.7 - 7.7 (K/uL)    Lymphocytes Relative 2 (*) 12 - 46 (%)    Lymphs Abs 0.3 (*) 0.7 - 4.0 (K/uL)    Monocytes Relative 3  3 - 12 (%)    Monocytes Absolute 0.3  0.1 - 1.0 (K/uL)    Eosinophils Relative 0  0 - 5 (%)    Eosinophils Absolute 0.0  0.0 - 0.7 (K/uL)    Basophils Relative 0   0 - 1 (%)    Basophils Absolute 0.0  0.0 - 0.1 (K/uL)   PROTIME-INR     Status: Abnormal   Collection Time   11/26/11 10:07 PM      Component Value Range Comment   Prothrombin Time 22.1 (*) 11.6 - 15.2 (seconds)    INR 1.90 (*) 0.00 - 1.49    LACTIC ACID, PLASMA     Status: Normal   Collection Time   11/26/11 10:22 PM      Component Value Range Comment   Lactic Acid, Venous 1.7  0.5 - 2.2 (mmol/L)   TROPONIN I     Status: Normal   Collection Time   11/26/11 10:22 PM      Component Value Range Comment   Troponin I <0.30  <0.30 (ng/mL)   COMPREHENSIVE METABOLIC PANEL     Status: Abnormal   Collection Time   11/26/11 10:23 PM      Component Value Range Comment   Sodium 135  135 - 145 (mEq/L)    Potassium 3.3 (*) 3.5 - 5.1 (mEq/L)    Chloride 100  96 - 112 (mEq/L)    CO2 27  19 - 32 (mEq/L)    Glucose, Bld 94  70 - 99 (mg/dL)    BUN 9  6 - 23 (mg/dL)    Creatinine, Ser 4.09  0.50 - 1.35 (mg/dL)    Calcium 8.8  8.4 - 10.5 (mg/dL)    Total Protein 5.7 (*) 6.0 - 8.3 (g/dL)    Albumin 3.1 (*) 3.5 - 5.2 (g/dL)    AST 29  0 - 37 (U/L)    ALT 15  0 - 53 (U/L)    Alkaline Phosphatase 79  39 - 117 (U/L)    Total Bilirubin 0.8  0.3 - 1.2 (mg/dL)    GFR calc non Af Amer 77 (*) >90 (mL/min)    GFR calc Af Amer 90 (*) >90 (mL/min)    Dg Tibia/fibula Right  11/26/2011  *RADIOLOGY REPORT*  Clinical Data: Cellulitis  RIGHT TIBIA AND FIBULA - 2 VIEW  Comparison: None  Findings: The right tibia and fibula appear intact.  No evidence of acute fracture or subluxation.  No focal bone erosion or sclerosis. No evidence of osteomyelitis.  No focal bone lesion or expansile change.  Bone cortex and trabecular architecture appear intact. Vascular calcifications and calcified phleboliths in the soft tissues.  No radiopaque foreign bodies.  No obvious subcutaneous gas collections.  Mild degenerative changes in the knee.  IMPRESSION: Right tibia and fibula appear intact without evidence of bone erosion or  osteomyelitis.  Original Report Authenticated By: Marlon Pel, M.D.   Ct Head Wo Contrast  11/26/2011  *RADIOLOGY REPORT*  Clinical Data:  Altered mental status after fall this morning.  CT HEAD WITHOUT CONTRAST CT CERVICAL SPINE WITHOUT CONTRAST  Technique:  Multidetector CT imaging of the head and cervical spine was performed following the standard protocol without intravenous contrast.  Multiplanar CT image reconstructions of the cervical spine  were also generated.  Comparison:  CT head 12/31/2008  CT HEAD  Findings: Diffuse bilateral cerebral and cerebellar atrophy.  Focal encephalomalacia in the right frontal lobe consistent with old infarct and stable since the prior study.  Ventricular dilatation consistent with central atrophy.  No mass effect or midline shift. No abnormal extra-axial fluid collections.  Gray-white matter junctions are distinct.  Basal cisterns are not effaced.  No evidence of acute intracranial hemorrhage.  Vascular calcifications.  No depressed skull fractures.  Mild supraorbital and nasal soft tissue swelling.  Mild irregularity of the nasal bones appear stable suggesting old fractures.  Mild mucosal membrane thickening in the paranasal sinuses.  No acute air-fluid levels are appreciated.  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  The no evidence of acute intracranial hemorrhage, mass lesion, or acute infarct.  CT CERVICAL SPINE  Findings: Slight retrolisthesis of left lateral mass of C1 with respect to C2, likely degenerative.  Otherwise normal alignment of the cervical vertebrae and facet joints.  Fusion of the C2-3 facet joint on the left which may be congenital or degenerative. Extensive degenerative changes throughout the facet joints bilaterally.  Degenerative changes throughout the cervical spine with narrowed cervical interspaces and associated endplate hypertrophic changes.  Prominent posterior disc osteophyte  complexes at C3-4, C5-6, and C6-7 levels.  Degenerative changes and bone erosion involving C1-2.  No significant vertebral compression deformity.  No prevertebral soft tissue swelling.  No significant infiltration into the paraspinal soft tissues.  Vascular calcifications in the carotid and vertebral arteries.  IMPRESSION: Diffuse degenerative change throughout the cervical spine with mild retrolisthesis of the left lateral mass of C1 with respect C2, likely degenerative.  No displaced fractures are identified.  Original Report Authenticated By: Marlon Pel, M.D.   Ct Cervical Spine Wo Contrast  11/26/2011  *RADIOLOGY REPORT*  Clinical Data:  Altered mental status after fall this morning.  CT HEAD WITHOUT CONTRAST CT CERVICAL SPINE WITHOUT CONTRAST  Technique:  Multidetector CT imaging of the head and cervical spine was performed following the standard protocol without intravenous contrast.  Multiplanar CT image reconstructions of the cervical spine were also generated.  Comparison:  CT head 12/31/2008  CT HEAD  Findings: Diffuse bilateral cerebral and cerebellar atrophy.  Focal encephalomalacia in the right frontal lobe consistent with old infarct and stable since the prior study.  Ventricular dilatation consistent with central atrophy.  No mass effect or midline shift. No abnormal extra-axial fluid collections.  Gray-white matter junctions are distinct.  Basal cisterns are not effaced.  No evidence of acute intracranial hemorrhage.  Vascular calcifications.  No depressed skull fractures.  Mild supraorbital and nasal soft tissue swelling.  Mild irregularity of the nasal bones appear stable suggesting old fractures.  Mild mucosal membrane thickening in the paranasal sinuses.  No acute air-fluid levels are appreciated.  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  The no evidence of acute intracranial hemorrhage, mass lesion, or acute infarct.  CT CERVICAL  SPINE  Findings: Slight retrolisthesis of left lateral mass of C1 with respect to C2, likely degenerative.  Otherwise normal alignment of the cervical vertebrae and facet joints.  Fusion of the C2-3 facet joint on the left which may be congenital or degenerative. Extensive degenerative changes throughout the facet joints bilaterally.  Degenerative changes throughout the cervical spine with narrowed cervical interspaces and associated endplate hypertrophic changes.  Prominent posterior disc osteophyte complexes at C3-4, C5-6, and  C6-7 levels.  Degenerative changes and bone erosion involving C1-2.  No significant vertebral compression deformity.  No prevertebral soft tissue swelling.  No significant infiltration into the paraspinal soft tissues.  Vascular calcifications in the carotid and vertebral arteries.  IMPRESSION: Diffuse degenerative change throughout the cervical spine with mild retrolisthesis of the left lateral mass of C1 with respect C2, likely degenerative.  No displaced fractures are identified.  Original Report Authenticated By: Marlon Pel, M.D.   Dg Chest Portable 1 View  11/26/2011  *RADIOLOGY REPORT*  Clinical Data: Fever  PORTABLE CHEST - 1 VIEW  Comparison: 03/30/2010  Findings: Slightly shallow inspiration.  Heart size and pulmonary vascularity are mildly increased.  Findings may suggest early congestive change although some of this could be attributed to technique.  No perihilar edema.  No focal airspace consolidation in the lungs.  No blunting of costophrenic angles.  No pneumothorax.  IMPRESSION: Mild cardiac enlargement and pulmonary vascular prominence.  No consolidation or edema.  Original Report Authenticated By: Marlon Pel, M.D.    Review of Systems  Constitutional: Positive for fever, chills, malaise/fatigue and diaphoresis.  HENT: Negative.   Eyes: Negative.   Respiratory: Negative.   Cardiovascular: Positive for palpitations and leg swelling. Negative for chest  pain, orthopnea, claudication and PND.  Gastrointestinal: Negative.   Genitourinary: Negative.   Musculoskeletal: Negative.   Skin: Negative.        Right lower extremity redness and pain  Neurological: Positive for weakness.  Endo/Heme/Allergies: Negative.   Psychiatric/Behavioral: Negative.     Blood pressure 70/40, pulse 121, temperature 102 F (38.9 C), temperature source Rectal, resp. rate 15, SpO2 95.00%. Physical Exam  Constitutional: He appears lethargic. No distress.       Acutely Ill looking  HENT:  Head: Atraumatic.  Right Ear: External ear normal.  Left Ear: External ear normal.  Nose: Nose normal.  Mouth/Throat: Oropharynx is clear and moist.  Eyes: Conjunctivae and EOM are normal. Pupils are equal, round, and reactive to light.  Neck: Normal range of motion. Neck supple.  Cardiovascular: Intact distal pulses.  An irregularly irregular rhythm present. Tachycardia present.  Exam reveals no gallop.   No murmur heard. Respiratory: Effort normal and breath sounds normal.  GI: Soft. Bowel sounds are normal.  Musculoskeletal: Normal range of motion.  Neurological: He has normal strength and normal reflexes. He appears lethargic. He is not disoriented. He displays a negative Romberg sign.  Skin: Skin is warm and dry. Lesion noted. He is not diaphoretic. There is erythema.     Psychiatric: He has a normal mood and affect. His speech is normal and behavior is normal. Thought content normal.     Assessment/Plan 1. Sepsis: associated with Hypotension. Source likely the Cellulitis. Will admit to the Stepdown unit, Vanc and zosyn, fluid resuscitation and close follow up 2. Cellulitis: Blood cultures, IV Vanc and zosyn 3. Afib: Will hold BB momentarily due to hypotension. Continue with Digoxin and warfarin 4. Anemia: Probably chronic disease due to RA. Continue to monitor 5. CAD: Stable. No CP 6. Encephalopathy: Toxic Metabolic from acute illness. Getting better now. 7.  Hypokalemia: Replete   Haeli Gerlich,LAWAL 11/27/2011, 12:13 AM

## 2011-11-27 NOTE — Plan of Care (Signed)
Problem: Consults Goal: Skin Care Protocol Initiated - if indicated If consults are not indicated, leave blank or document N/A Wound consult this am 11/26/10. Wound Culture sent. Recommendations: apply Eucerin cream to legs and feet, apply santyl (see MAR), apply dressing to weeping area.

## 2011-11-27 NOTE — H&P (Signed)
Name: [name] [name] MRN: [id number] DOB: 05/08/1925  LOS: 1  CRITICAL CARE ADMISSION NOTE  History of Present Illness:  The patient is alert but cannot remember the events leading up to this hospitalization, and history was largely obtained through chart review. The patient is an 78 yo man, history of afib, CAD, HTN, presenting with hypotension, altered mental status, and a RLE wound.  The patient's family noted that the patient had had declining mental status over the last week.  On the day of admission, he was found after falling on the floor by his wife, and was brought to the ED.  In the ED, the patient was found to have a large RLE wound with warmth, erythema, and edema, and with fever to 104.1, tachycardia, and BP's which fell to the SBP 60's-80's.  At baseline the patient is able to move around with minimal support, but he is currently more weak and lethargic than normal.  He notes no dyspnea or chest pain.  In the ED, the patient was given vancomycin and clindamycin, as well as 5 liters of IV fluid, and placed on norepinephrine, and admitted to ICU out of concern for sepsis.  Lines / Drains: Central line (11/27/11) Peripheral IV  Cultures / Sepsis markers: Blood cultures drawn 11/26/11 Urine culture drawn 11/26/11  Antibiotics: Vancomycin - 11/26/11 -> continue Clindamycin - 11/26/11 -> 11/26/11 Zosyn - 11/27/11 -> 11/27/11 Unasyn 11/27/11  Tests / Events: 11/27/11 - initiated sepsis protocol, placed central line     Past Medical History  Diagnosis Date  . Arrhythmia   . Carotid artery occlusion     right total internal artery occlusion  . Coronary artery disease     s/p cardiac cath in 1990s, and cardiolite study  in 2005 showing EF 54%  . Hyperlipidemia   . Hypertension   . TIA (transient ischemic attack)   . Campath-induced atrial fibrillation   . Myocardial infarction 1992 and 1994    history of  . Degenerative disc disease     cervical spine   Past Surgical History  Procedure  Date  . Cardiac catheterization 1990s  . Cataract extraction, bilateral    Prior to Admission medications   Medication Sig Start Date End Date Taking? Authorizing Provider  aspirin 81 MG tablet Take 81 mg by mouth daily.     Yes Historical Provider, MD  atorvastatin (LIPITOR) 10 MG tablet Take 1 tablet (10 mg total) by mouth daily. 10/19/11 10/18/12 Yes Valera Castle, MD  Calcium-Vitamin D (CALTRATE 600 PLUS-VIT D PO) Take by mouth 1 day or 1 dose.    Yes Historical Provider, MD  digoxin (LANOXIN) 0.125 MG tablet Take 1 tablet (125 mcg total) by mouth daily. 04/25/11  Yes Valera Castle, MD  HYDROcodone-homatropine [facility]) 5-1.5 MG/5ML syrup Take 5 mLs by mouth every 8 (eight) hours as needed for cough. 11/20/11 11/30/11 Yes Gaetano Net Todd  methotrexate (RHEUMATREX) 2.5 MG tablet Take 2.5 mg by mouth 2 (two) times a week. Take 5 tabs Saturday and Sunday   Yes Historical Provider, MD  metoprolol (TOPROL-XL) 50 MG 24 hr tablet Take 1 tablet (50 mg total) by mouth 2 (two) times daily. 11/11/11  Yes Valera Castle, MD  Multiple Vitamins-Minerals (CENTRUM SILVER PO) Take by mouth.     Yes Historical Provider, MD  niacin (NIASPAN) 1000 MG CR tablet Take 1 tablet (1,000 mg total) by mouth at bedtime. 2 tabs at bedtime 05/15/11  Yes Valera Castle, MD  nitroGLYCERIN (NITROSTAT) 0.4 MG SL  tablet Place 0.4 mg under the tongue every 5 (five) minutes as needed. For chest pain  02/24/11  Yes Valera Castle, MD  traMADol (ULTRAM) 50 MG tablet Take 50 mg by mouth. Take one tab 2-3 times a day as needed    Yes Historical Provider, MD  warfarin (COUMADIN) 5 MG tablet Take as directed by Anticoagulation clinic  (Patient takes up to 1 tablet daily) 07/10/11  Yes Valera Castle, MD   No Known Allergies  Family History  Problem Relation Age of Onset  . Heart disease Mother   . Malignant hyperthermia Mother   . Heart disease Father   . Malignant hyperthermia Father    Social History  reports that he has quit smoking. His  smoking use included Cigarettes. He has a 40 pack-year smoking history. He quit smokeless tobacco use about 21 years ago. He reports that he does not drink alcohol or use illicit drugs.  Review Of Systems   Gen: Denies fever, chills, weight change, fatigue, night sweats HEENT: Denies blurred vision, double vision, hearing loss, tinnitus, sinus congestion, rhinorrhea, sore throat, neck stiffness, dysphagia PULM: Denies shortness of breath, cough, sputum production, hemoptysis, wheezing CV: Denies chest pain, edema, orthopnea, paroxysmal nocturnal dyspnea, palpitations GI: Denies abdominal pain, nausea, vomiting, diarrhea, hematochezia, melena, constipation, change in bowel habits GU: Denies dysuria, hematuria, polyuria, oliguria, urethral discharge Endocrine: Denies hot or cold intolerance, polyuria, polyphagia or appetite change Derm: see HPI Heme: Denies easy bruising, bleeding, bleeding gums Neuro: Denies headache, numbness, weakness, slurred speech, loss of memory or consciousness  Vital Signs:   Filed Vitals:   11/27/11 0210 11/27/11 0215 11/27/11 0257 11/27/11 0300  BP: 99/48 109/55    Pulse: 103 92    Temp:   98.6 F (37 C)   TempSrc:   Oral   Resp: 16 16    Height:    6' (1.829 m)  Weight:    94 kg (207 lb 3.7 oz)  SpO2: 96% 96%       Physical Examination: Gen: alert, no acute distress HEENT: NCAT, PERRL, EOMi, OP clear,  Neck: supple without masses PULM: CTA B CV: Irreg, irreg, systolic murmur LUSB AB: BS+, soft, nontender, no hsm Ext: warm, no edema, no clubbing, no cyanosis Derm: RLE with 2 large non-purulent lacerations with overlying scabs, with a large surrounding area of erythema, edema and warmth Neuro: A&Ox4, CN II-XII intact, strength 5/5 in all 4 extremities Psyche: Normal mood and affect  Labs and Imaging:    CMP     Component Value Date/Time   NA 135 11/26/2011 2223   K 3.3* 11/26/2011 2223   CL 100 11/26/2011 2223   CO2 27 11/26/2011 2223   GLUCOSE 94  11/26/2011 2223   BUN 9 11/26/2011 2223   CREATININE 0.86 11/26/2011 2223   CALCIUM 8.8 11/26/2011 2223   PROT 5.7* 11/26/2011 2223   ALBUMIN 3.1* 11/26/2011 2223   AST 29 11/26/2011 2223   ALT 15 11/26/2011 2223   ALKPHOS 79 11/26/2011 2223   BILITOT 0.8 11/26/2011 2223   GFRNONAA 77* 11/26/2011 2223   GFRAA 90* 11/26/2011 2223   CBC    Component Value Date/Time   WBC 12.0* 11/26/2011 2207   RBC 3.28* 11/26/2011 2207   HGB 11.0* 11/26/2011 2207   HCT 32.1* 11/26/2011 2207   PLT 131* 11/26/2011 2207   MCV 97.9 11/26/2011 2207   MCH 33.5 11/26/2011 2207   MCHC 34.3 11/26/2011 2207   RDW 15.2 11/26/2011 2207   LYMPHSABS  0.3* 11/26/2011 2207   MONOABS 0.3 11/26/2011 2207   EOSABS 0.0 11/26/2011 2207   BASOSABS 0.0 11/26/2011 2207   Trop: <0.3 Lactic Acid: 1.7 PT: 22.1 INR: 1.9  CT HEAD WITHOUT CONTRAST  CT CERVICAL SPINE WITHOUT CONTRAST  Comparison: CT head 12/31/2008   CT HEAD  Findings: Diffuse bilateral cerebral and cerebellar atrophy. Focal  encephalomalacia in the right frontal lobe consistent with old  infarct and stable since the prior study. Ventricular dilatation  consistent with central atrophy. No mass effect or midline shift.  No abnormal extra-axial fluid collections. Gray-white matter  junctions are distinct. Basal cisterns are not effaced. No  evidence of acute intracranial hemorrhage. Vascular  calcifications. No depressed skull fractures. Mild supraorbital  and nasal soft tissue swelling. Mild irregularity of the nasal  bones appear stable suggesting old fractures. Mild mucosal  membrane thickening in the paranasal sinuses. No acute air-fluid  levels are appreciated.  IMPRESSION:  Chronic atrophy and small vessel ischemic changes with old right  frontal infarct. The no evidence of acute intracranial hemorrhage,  mass lesion, or acute infarct.   CT CERVICAL SPINE  Findings: Slight retrolisthesis of left lateral mass of C1 with  respect to C2, likely degenerative. Otherwise normal alignment of    the cervical vertebrae and facet joints. Fusion of the C2-3 facet  joint on the left which may be congenital or degenerative.  Extensive degenerative changes throughout the facet joints  bilaterally. Degenerative changes throughout the cervical spine  with narrowed cervical interspaces and associated endplate  hypertrophic changes. Prominent posterior disc osteophyte  complexes at C3-4, C5-6, and C6-7 levels. Degenerative changes and  bone erosion involving C1-2. No significant vertebral compression  deformity. No prevertebral soft tissue swelling. No significant  infiltration into the paraspinal soft tissues. Vascular  calcifications in the carotid and vertebral arteries.  IMPRESSION:  Diffuse degenerative change throughout the cervical spine with mild  retrolisthesis of the left lateral mass of C1 with respect C2,  likely degenerative. No displaced fractures are identified.   Assessment and Plan: The patient is an 78 yo man, with a history of afib, CAD, HTN, presenting with RLE edema, erythema, and warmth, with tachycardia, fevers, leukocytosis, and hypotension, concerning for sepsis due to RLE cellulitis.  We're placing a central line, and initiating sepsis protocol.  1. Sepsis - leukocytosis, tachycardia, and fever, with RLE cellulitis.  Patient given 5 liters of NS in ED, as well as one dose of vancomycin and clindamycin.  Most likely staph or strep causing this -initiating sepsis protocol -continue IVF -continue vancomycin, start unasyn -blood cultures pending  2. RLE Cellulitis - age of RLE lesion unknown, but family has noticed change in gait and increased falls x1 week. -vanc, unasyn -sepsis protocol  3. Hypotension - in setting of #1, started on norepinephrine for hypotension, and given IVF in ED -continue norepinephrine per EGDT -continue IVF  4. Altered Mental Status - likely due to infection/sepsis, but our differential also includes digitalis toxicity, dementia, and  delirium. -sepsis protocol -checking digitalis level  5. Atrial fibrillation - history of atrial fibrillation, currently with an irregular heart rate and concern for afib on monitor.  On long-term coumadin, INR = 1.9.  Currently rate-controlled. -monitor for continued sustained afib -consider amiodarone, phenylephrine if patient enters sustained afib -follow INR levels   Best practices / Disposition: ICU status on PCCM service  Feeding/protein malnutrition: Heart Healthy diet Analgesia: None Sedation: None Thromboprophylaxis: Warfarin, repeat INR in AM, may need hep gtt HOB >  30 degrees Ulcer prophylaxis: None Glucose control/hyperglycemia: ICU Hyperglycemia protocol  The patient is critically ill with multiple organ systems failure and requires high complexity decision making for assessment and support, frequent evaluation and titration of therapies, application of advanced monitoring technologies and extensive interpretation of multiple databases. Critical Care Time devoted to patient care services described in this note is 60 minutes.  Janalyn Harder, PGY1 Signed 11/27/11, 3:27am  Heber Lutcher, M.D. Pulmonary and Critical Care Medicine [facility] Pager: [phone]  11/27/2011, 2:45 AM

## 2011-11-27 NOTE — Plan of Care (Signed)
Problem: Consults Goal: Nutrition Consult-if indicated Pt eating cardiac diet without difficulties.

## 2011-11-27 NOTE — Progress Notes (Signed)
ANTIBIOTIC/ANTICOAGULATION CONSULT NOTE - FOLLOW UP  Pharmacy Consult for Vanco/Unasyn; coumadin Indication: Sepsis with RLE wound. Coumadin for Afib  No Known Allergies  Patient Measurements: Height: 6' (182.9 cm) Weight: 207 lb 3.7 oz (94 kg) IBW/kg (Calculated) : 77.6  Adjusted Body Weight:   Vital Signs: Temp: 97.3 F (36.3 C) (01/03 1210) Temp src: Oral (01/03 1210) BP: 119/55 mmHg (01/03 1230) Pulse Rate: 78  (01/03 1230) Intake/Output from previous day: 01/02 0701 - 01/03 0700 In: 5557.5 [I.V.:5457.5; IV Piggyback:100] Out: 575 [Urine:575] Intake/Output from this shift: Total I/O In: 1025 [P.O.:480; I.V.:195; IV Piggyback:350] Out: 245 [Urine:245]  Labs:  [facility] 11/27/11 0350 11/26/11 2223 11/26/11 2222 11/26/11 2207  HGB 10.7* -- -- 11.0*  HCT 31.6* -- -- 32.1*  PLT 142* -- -- 131*  APTT -- -- -- --  LABPROT -- -- -- 22.1*  INR -- -- -- 1.90*  HEPARINUNFRC -- -- -- --  CREATININE 0.85 0.86 -- --  CKTOTAL -- -- -- --  CKMB -- -- -- --  TROPONINI -- -- <0.30 --   Estimated Creatinine Clearance: 77 ml/min (by C-G formula based on Cr of 0.85).  Microbiology: Recent Results (from the past 720 hour(s))  MRSA PCR SCREENING     Status: Normal   Collection Time   11/27/11  2:53 AM      Component Value Range Status Comment   MRSA by PCR NEGATIVE  NEGATIVE  Final     Anti-infectives     Start     Dose/Rate Route Frequency Ordered Stop   11/27/11 1000   vancomycin (VANCOCIN) 1,250 mg in sodium chloride 0.9 % 250 mL IVPB        1,250 mg 166.7 mL/hr over 90 Minutes Intravenous Every 12 hours 11/27/11 0338     11/27/11 0400   piperacillin-tazobactam (ZOSYN) IVPB 3.375 g  Status:  Discontinued        3.375 g 12.5 mL/hr over 240 Minutes Intravenous 3 times per day 11/27/11 0338 11/27/11 0344   11/27/11 0400   Ampicillin-Sulbactam (UNASYN) 3 g in sodium chloride 0.9 % 100 mL IVPB        3 g 100 mL/hr over 60 Minutes Intravenous Every 6 hours 11/27/11 0345     11/26/11 2345   vancomycin (VANCOCIN) IVPB 1000 mg/200 mL premix        1,000 mg 200 mL/hr over 60 Minutes Intravenous  Once 11/26/11 2336 11/27/11 0045   11/26/11 2230   clindamycin (CLEOCIN) IVPB 600 mg        600 mg 100 mL/hr over 30 Minutes Intravenous  Once 11/26/11 2223 11/26/11 2336          Medications:  Scheduled:    . sodium chloride   Intravenous Once  . sodium chloride   Intravenous Once  . acetaminophen      . acetaminophen  975 mg Rectal Once  . ampicillin-sulbactam (UNASYN) IV  3 g Intravenous Q6H  . aspirin  81 mg Oral Daily  . clindamycin (CLEOCIN) IV  600 mg Intravenous Once  . collagenase   Topical Daily  . hydrocerin   Topical BID  . ibuprofen  600 mg Oral Once  . insulin aspart  0-3 Units Subcutaneous Q4H  . niacin  1,000 mg Oral QHS  . norepinephrine (LEVOPHED) Adult infusion  2-50 mcg/min Intravenous To Major  . potassium chloride  10 mEq Intravenous Q1 Hr x 2  . simvastatin  20 mg Oral Daily  . sodium chloride  1,000  mL Intravenous Once  . sodium chloride  1,000 mL Intravenous Once  . sodium chloride  500 mL Intravenous Once  . sodium chloride  500 mL Intravenous Once  . sodium chloride      . vancomycin  1,250 mg Intravenous Q12H  . vancomycin  1,000 mg Intravenous Once  . warfarin  5 mg Oral ONCE-1800  . DISCONTD: acetaminophen      . DISCONTD: acetaminophen  1,000 mg Oral Once  . DISCONTD: digoxin  125 mcg Oral Daily  . DISCONTD: insulin aspart  3 Units Subcutaneous Q4H  . DISCONTD: methotrexate  12.5 mg Oral Custom  . DISCONTD: methotrexate  2.5 mg Oral 2 times weekly  . DISCONTD: norepinephrine (LEVOPHED) Adult infusion  2-50 mcg/min Intravenous To Major  . DISCONTD: piperacillin-tazobactam (ZOSYN)  IV  3.375 g Intravenous Q8H  . DISCONTD: sodium chloride  1,000 mL Intravenous Once    Assessment: [name] admitted altered mental status and RLE wound.   Admit Complaint: Pharmacist System-Based Medication Review: Anticoagulation:  Couamdin for h/o afib. INR last pm=1.9. Fall at home ~1 week ago, CT head = No bleed. Hgb=10.7, plts=142. Infectious Disease: Vanco/Unasyn D#1 for RLE cellulitis.  Tm=104.1, WBC=30. Wound cx pending.  Cardiovascular: BP improved after fluid, on NE gtt, MAP=70.. PMH=afib, carotid artery stenosis, lipidemia, HTN. MI.  Currently on ASA, niacin and statin, home meds not yet resumed: toprol, digoxin.  Endocrinology: CBGs controlled on SSI.  No h/o DM Gastrointestinal / Nutrition: heart healthy diet Neurology: PMH=TIA on coumadin Nephrology: renal fx stable but UOP marginal since ICU admit. Pulmonary: 100% RA Hematology / Oncology: CBC is stable PTA Medication Issues: Await until appropriate to resume Best Practices: coumadin, no SUP (not intubated)  Goal of Therapy:  INR 2-3 Vancomycin trough =10-49mcg/ml  Plan:  1. Continue with order for warfarin 5mg  tonight 2. Change vancomcyin to 1gm IV q12h for age and est CrCl.  3. Continue Unasyn 3gm VI q6h  Dannielle Huh 11/27/2011,1:05 PM

## 2011-11-27 NOTE — Progress Notes (Signed)
3:43 AM  Zosyn d/c 'ed by CCM. Changed to unasyn.   Will dose unasyn at 3gm q6h based on current renal clearance.   Janice Coffin

## 2011-11-27 NOTE — Progress Notes (Signed)
Patient has two gold colored rings, and one gold colored ring with a red stone,  that he wants to keep with him.  Patient says he had a watch and his daughters took it home with them.

## 2011-11-27 NOTE — ED Notes (Signed)
Dr Mikeal Hawthorne notified of patients BP.  New orders for Levophed and PCCM to come and see patient.

## 2011-11-27 NOTE — ED Notes (Signed)
Granddaughter [name] [name] - cell - [phone]

## 2011-11-28 LAB — PROTIME-INR
INR: 2.19 — ABNORMAL HIGH (ref 0.00–1.49)
Prothrombin Time: 27.6 seconds — ABNORMAL HIGH (ref 11.6–15.2)
Prothrombin Time: 24.7 seconds — ABNORMAL HIGH (ref 11.6–15.2)

## 2011-11-28 LAB — GLUCOSE, CAPILLARY
Glucose-Capillary: 104 mg/dL — ABNORMAL HIGH (ref 70–99)
Glucose-Capillary: 91 mg/dL (ref 70–99)
Glucose-Capillary: 104 mg/dL — ABNORMAL HIGH (ref 70–99)
Glucose-Capillary: 128 mg/dL — ABNORMAL HIGH (ref 70–99)
Glucose-Capillary: 99 mg/dL (ref 70–99)

## 2011-11-28 LAB — MAGNESIUM: Magnesium: 1.5 mg/dL (ref 1.5–2.5)

## 2011-11-28 LAB — BASIC METABOLIC PANEL
BUN: 10 mg/dL (ref 6–23)
Chloride: 105 mEq/L (ref 96–112)
GFR calc Af Amer: >90 mL/min (ref >90)
GFR calc non Af Amer: 85 mL/min — ABNORMAL LOW (ref >90)
Potassium: 3.2 mEq/L — ABNORMAL LOW (ref 3.5–5.1)
Sodium: 136 mEq/L (ref 135–145)

## 2011-11-28 LAB — CBC
HCT: 31.6 % — ABNORMAL LOW (ref 39.0–52.0)
Hemoglobin: 10.7 g/dL — ABNORMAL LOW (ref 13.0–17.0)
RDW: 15.9 % — ABNORMAL HIGH (ref 11.5–15.5)
WBC: 23.7 10*3/uL — ABNORMAL HIGH (ref 4.0–10.5)

## 2011-11-28 MED ORDER — WARFARIN SODIUM 5 MG PO TABS
5.0000 mg | ORAL_TABLET | Freq: Once | ORAL | Status: AC
  Administered 2011-11-28: 5 mg via ORAL
  Filled 2011-11-28 (×2): qty 1

## 2011-11-28 MED ORDER — POTASSIUM CHLORIDE CRYS ER 20 MEQ PO TBCR
40.0000 meq | EXTENDED_RELEASE_TABLET | Freq: Two times a day (BID) | ORAL | Status: AC
  Administered 2011-11-28 (×2): 40 meq via ORAL
  Filled 2011-11-28 (×2): qty 2

## 2011-11-28 MED ORDER — IPRATROPIUM BROMIDE 0.02 % IN SOLN
0.5000 mg | RESPIRATORY_TRACT | Status: DC | PRN
  Administered 2011-11-29 – 2011-12-02 (×3): 0.5 mg via RESPIRATORY_TRACT
  Filled 2011-11-28 (×3): qty 2.5

## 2011-11-28 MED ORDER — DIGOXIN 125 MCG PO TABS
0.1250 mg | ORAL_TABLET | Freq: Every day | ORAL | Status: DC
  Administered 2011-11-29 – 2011-12-03 (×5): 0.125 mg via ORAL
  Filled 2011-11-28 (×5): qty 1

## 2011-11-28 MED ORDER — ALBUTEROL SULFATE (5 MG/ML) 0.5% IN NEBU
2.5000 mg | INHALATION_SOLUTION | RESPIRATORY_TRACT | Status: DC | PRN
  Administered 2011-11-29 – 2011-12-02 (×3): 2.5 mg via RESPIRATORY_TRACT
  Filled 2011-11-28 (×3): qty 0.5

## 2011-11-28 MED ORDER — DIGOXIN 0.25 MG/ML IJ SOLN
0.5000 mg | Freq: Once | INTRAMUSCULAR | Status: AC
  Administered 2011-11-28: 0.5 mg via INTRAVENOUS
  Filled 2011-11-28: qty 2

## 2011-11-28 MED ORDER — DIGOXIN 0.25 MG/ML IJ SOLN
0.5000 mg | Freq: Every day | INTRAMUSCULAR | Status: AC
  Administered 2011-11-28: 0.5 mg via INTRAVENOUS
  Filled 2011-11-28: qty 2

## 2011-11-28 NOTE — Clinical Documentation Improvement (Signed)
SEPSIS DOCUMENTATION QUERY  THIS DOCUMENT IS NOT A PERMANENT PART OF THE MEDICAL RECORD  TO RESPOND TO THE THIS QUERY, FOLLOW THE INSTRUCTIONS BELOW:  1. If needed, update documentation for the patient's encounter via the notes activity.  2. Access this query again and click edit on the In [employer].  3. After updating, or not, click F2 to complete all highlighted (required) fields concerning your review. Select "additional documentation in the medical record" OR "no additional documentation provided".  4. Click Sign note button.  5. The deficiency will fall out of your In Basket *Please let us know if you are not able to complete this workflow by phone or e-mail (listed below).  Please update your documentation within the medical record to reflect your response to this query.                                                                                    11/28/11  Dear Dr. Tyson Alias Marton Redwood,  In a better effort to capture your patient's severity of illness, reflect appropriate length of stay and utilization of resources, a review of the patient medical record has revealed the following indicators.    Based on your clinical judgment, please clarify and document in a progress note and/or discharge summary the clinical condition associated with the following supporting information:  In responding to this query please exercise your independent judgment.  The fact that a query is asked, does not imply that any particular answer is desired or expected.   Possible Clinical Conditions?  Severe Sepsis  Septic Shock  Other Condition __________________ Cannot clinically Determine _______________  Clinical Information:  Risk Factors: immunosuppressed, chronic illness per H&P:Anemia: Probably chronic disease due to RA  Presenting S&S: VS: Temp= 104.1 RR=18 P=123 SBP=60-80's  Diagnostics: Lab: (WBC: 30.1, Neuts: 92, Lymph: 2, lactic acid: 1.7)   Cultures:Specimen  Description WOUND RIGHT LOWER LEG  Special Requests Normal  Gram Stain NO WBC SEEN RARE SQUAMOUS EPITHELIAL CELLS PRESENT FEW GRAM POSITIVE COCCI IN PAIRS   Blood: pending (NGTD) Urine: No growth  Radiology:Heart size and pulmonary vascularity are mildly increased. Findings may suggest early congestive change although some of this could be attributed to technique.   IMPRESSION: Mild cardiac enlargement and pulmonary vascular prominence  Treatment: Vancomycin, clindamycin; norepinephrine    Reviewed:  no additional documentation provided [facility]  Thank You,  Amada Kingfisher RN, BSN, CCM Clinical Documentation Specialist: [phone]   Health Information Management [place]

## 2011-11-28 NOTE — Progress Notes (Addendum)
Name: [name] [name] MRN: [id number] DOB: December 28, 1924    LOS: 2  PCCM Progress NOTE  History of Present Illness:  The patient is alert but cannot remember the events leading up to this hospitalization, and history was largely obtained through chart review.  The patient is an 78 yo man, history of afib, CAD, HTN, presenting with hypotension, altered mental status, and a RLE wound. The patient's family noted that the patient had had declining mental status over the last week. On the day of admission, he was found after falling on the floor by his wife, and was brought to the ED. In the ED, the patient was found to have a large RLE wound with warmth, erythema, and edema, and with fever to 104.1, tachycardia, and BP's which fell to the SBP 60's-80's. At baseline the patient is able to move around with minimal support, but he is currently more weak and lethargic than normal. He notes no dyspnea or chest pain. In the ED, the patient was given vancomycin and clindamycin, as well as 5 liters of IV fluid, and placed on norepinephrine, and admitted to ICU out of concern for sepsis   Lines / Drains: 1/3   R IJ CVP line         Peripheral IV (NS at 125 ml/hour)   Cultures: 1/2  Blood cultures>> NGTD 2/2 1/2  Urine culture >> 1/3 final: No growth 1/3  Wound culture>> few GPC in pairs 1/3   MRSA PCR NEG 1/3   Digoxin level 0.4 L  Antibiotics: 1/2  Clindamycin x 1 one dose and D/C'd 1/2  Zosyn -never given and D/C'd 1/2  Vancomycin >> 1/3   Unasyn >>  Tests / Events: 1/3- initiated sepsis protocol, placed central line 1/3- 2 D echo: EF 45-50%, Mild mitral valve regurgitation. Left atrium mildly to moderately dilated. Right ventricle mildly dilated.                        Right atrium moderately dilated. Pulmonary Systolic pressure was mildly to moderately increased. 1/3 EKG:   Vital Signs: Temp:  [97.3 F (36.3 C)-99.6 F (37.6 C)] 98.4 F (36.9 C) (01/04 0755) Pulse Rate:  [54-140] 101   (01/04 0800) Resp:  [16-46] 16  (01/04 0800) BP: (98-138)/(47-76) 122/52 mmHg (01/04 0800) SpO2:  [96 %-100 %] 97 % (01/04 0800) Weight:  [218 lb 7.6 oz (99.1 kg)] 218 lb 7.6 oz (99.1 kg) (01/04 0600) I/O last 3 completed shifts: In: 9639.2 [P.O.:480; I.V.:8309.2; IV Piggyback:850] Out: 1905 [Urine:1905]  Physical Examination: Gen: alert, no acute distress  HEENT: NCAT, PERRL, EOMi, OP clear,  Neck: supple without masses  PULM: CTA B  CV: Irreg, irreg, systolic murmur LUSB  AB: BS+, soft, nontender, no hsm  Ext: warm, no edema, no clubbing, no cyanosis  Derm: RLE with 2 large non-purulent lacerations with overlying scabs, with a large surrounding area of erythema, edema and warmth  Neuro: A&Ox4, CN II-XII intact, strength 5/5 in all 4 extremities  Psyche: Normal mood and affect     Labs and Imaging:   Basic Metabolic Panel:  Basename 11/28/11 0950 11/27/11 0350  NA 136 136  K 3.2* 3.1*  CL 105 105  CO2 23 20  GLUCOSE 100* 117*  BUN 10 11  CREATININE 0.68 0.85  CALCIUM 7.8* 7.6*  MG -- --  PHOS -- --   Liver Function Tests:  [facility] 11/26/11 2223  AST 29  ALT 15  ALKPHOS 79  BILITOT 0.8  PROT 5.7*  ALBUMIN 3.1*   CBC:  Basename 11/28/11 0950 11/27/11 0350 11/26/11 2207  WBC 23.7* 30.1* --  NEUTROABS -- 27.7* 11.4*  HGB 10.7* 10.7* --  HCT 31.6* 31.6* --  MCV 97.2 97.2 --  PLT 125* 142* --   Cardiac Enzymes:  Basename 11/26/11 2222  CKTOTAL --  CKMB --  CKMBINDEX --  TROPONINI <0.30   CBG:  Basename 11/28/11 0753 11/28/11 0355 11/27/11 2338 11/27/11 2144 11/27/11 1543 11/27/11 1204  GLUCAP 91 104* 131* 121* 109* 122*   Thyroid Function Tests:  Basename 11/27/11 0330  TSH 1.293  T4TOTAL --  FREET4 --  T3FREE --  THYROIDAB --   Coagulation:  Basename 11/28/11 0950 11/28/11 0410  LABPROT 24.7* 27.6*  INR 2.19* 2.52*   Urinalysis: Negative  Urine culture Negative  Imaging 1/2 RLE Xray:   without evidence of bone erosion or  osteomyelitis.  1/2 CXR:           Mild cardiac enlargement and pulmonary vascular prominence. No consolidation or edema.  1/2 Head CT:    Chronic atrophy and small vessel ischemic changes with old right frontal infarct. The no evidence of                                 acute intracranial hemorrhage, mass lesion, or acute infarct 1/2 Cervical CT: Diffuse degenerative change throughout the cervical spine with mild retrolisthesis of the left lateral                                    mass of C1 with respect C2, likely degenerative. No displaced fractures are identified 1/3 CXR:            Interval placement of right subclavian venous catheter with tip over the mid SVC region. No pneumothorax  2 D echo:  - Left ventricle: The cavity size was normal. Systolic function was mildly reduced. The estimated ejection fraction was in the range of 45% to 50%. Wall motion was normal; there were no regional wall motion abnormalities. - Mitral valve: Mild regurgitation. - Left atrium: The atrium was mildly to moderately dilated. - Right ventricle: The cavity size was mildly dilated. Wall thickness was normal. Systolic function was mildly reduced. - Right atrium: The atrium was moderately dilated. - Pulmonary arteries: Systolic pressure was mildly to moderately increased.    Assessment and Plan:  The patient is an 78 yo man, with a history of afib, CAD, HTN, presenting with RLE edema, erythema, and warmth, with tachycardia, fevers, leukocytosis, and hypotension, concerning for sepsis due to RLE cellulitis. We're placing a central line, and initiating sepsis protocol.  1. Sepsis - leukocytosis, tachycardia, and fever, with RLE cellulitis. Patient given 5 liters of NS in ED, as well as one dose of vancomycin and clindamycin. Wound culture indicates GPC in pairs  - sepsis protocol completed -kvo down as pressors -continue vancomycin and unasyn until exact ID sens -blood cultures NGTD Dc line  2. RLE  Cellulitis - age of RLE lesion unknown, but family has noticed change in gait and increased falls x1 week.  -vanc, unasyn , narrow off vanc if able in am  -sepsis protocol  - wound care SUB THERE  IN PAST, DOPPLER LEGS  3. Hypotension - resolved in setting of #1, started on norepinephrine for hypotension, and  given IVF in ED  -may consider D/C IVF if intake is well - consider transfer to regular floor  4. Altered Mental Status - lresolved ikely due to infection/sepsis, digoxin level is 0.7 -sepsis protocol   5. Atrial fibrillation - history of atrial fibrillation, currently with an irregular heart rate and concern for afib on monitor. On long-term coumadin, INR therapeutic,. Currently rate-controlled. Had some increase in evening -monitor for continued sustained afib  -dig 0.5 iv x 1 then 0.125 mg po qday -follow INR levels  Consider dig level in 4 days Consider tele RESTART COUMADIN  Best practices / Disposition: ICU status on PCCM service  Feeding/protein malnutrition: Heart Healthy diet  Analgesia: None  Sedation: None  Thromboprophylaxis: Warfarin, repeat INR in AM, may need hep gtt  HOB >30 degrees  Ulcer prophylaxis: None  Glucose control/hyperglycemia: ICU Hyperglycemia protocol    To tele, triad thaNK YOU WILL SIGN OFF  Mcarthur Rossetti. Tyson Alias, MD, FACP Pgr: [phone] [facility]      LI, NA 11/28/2011, 11:50 AM

## 2011-11-28 NOTE — Progress Notes (Signed)
ANTICOAGULATION CONSULT NOTE - FOLLOW UP  Pharmacy Consult for Coumadin Indication: Coumadin for Afib  No Known Allergies  Patient Measurements: Height: 6' (182.9 cm) Weight: 218 lb 7.6 oz (99.1 kg) IBW/kg (Calculated) : 77.6  Adjusted Body Weight:   Vital Signs: Temp: 98 F (36.7 C) (01/04 1233) Temp src: Oral (01/04 1233) BP: 129/89 mmHg (01/04 1300) Pulse Rate: 95  (01/04 1300) Intake/Output from previous day: 01/03 0701 - 01/04 0700 In: 4081.7 [P.O.:480; I.V.:2851.7; IV Piggyback:750] Out: 1331 [Urine:1330; Stool:1] Intake/Output from this shift: Total I/O In: 1050 [I.V.:750; IV Piggyback:300] Out: 687 [Urine:685; Stool:2]  Labs:  Basename 11/27/11 0350 11/26/11 2223 11/26/11 2222 11/26/11 2207  HGB 10.7* -- -- 11.0*  HCT 31.6* -- -- 32.1*  PLT 142* -- -- 131*  APTT -- -- -- --  LABPROT -- -- -- 22.1*  INR -- -- -- 1.90*  HEPARINUNFRC -- -- -- --  CREATININE 0.85 0.86 -- --  CKTOTAL -- -- -- --  CKMB -- -- -- --  TROPONINI -- -- <0.30 --   Estimated Creatinine Clearance: 77 ml/min (by C-G formula based on Cr of 0.85).  Microbiology: Recent Results (from the past 720 hour(s))  URINE CULTURE     Status: Normal   Collection Time   11/26/11  9:06 PM      Component Value Range Status Comment   Specimen Description URINE, CATHETERIZED   Final    Special Requests NONE   Final    Setup Time [id number]   Final    Colony Count NO GROWTH   Final    Culture NO GROWTH   Final    Report Status 11/27/2011 FINAL   Final   CULTURE, BLOOD (ROUTINE X 2)     Status: Normal (Preliminary result)   Collection Time   11/26/11 10:00 PM      Component Value Range Status Comment   Specimen Description BLOOD LEFT HAND   Final    Special Requests BOTTLES DRAWN AEROBIC ONLY   Final    Setup Time [id number]   Final    Culture     Final    Value:        BLOOD CULTURE RECEIVED NO GROWTH TO DATE CULTURE WILL BE HELD FOR 5 DAYS BEFORE ISSUING A FINAL NEGATIVE REPORT   Report  Status PENDING   Incomplete   CULTURE, BLOOD (ROUTINE X 2)     Status: Normal (Preliminary result)   Collection Time   11/26/11 10:21 PM      Component Value Range Status Comment   Specimen Description BLOOD RIGHT HAND   Final    Special Requests BOTTLES DRAWN AEROBIC ONLY 10CC   Final    Setup Time [id number]   Final    Culture     Final    Value:        BLOOD CULTURE RECEIVED NO GROWTH TO DATE CULTURE WILL BE HELD FOR 5 DAYS BEFORE ISSUING A FINAL NEGATIVE REPORT   Report Status PENDING   Incomplete   MRSA PCR SCREENING     Status: Normal   Collection Time   11/27/11  2:53 AM      Component Value Range Status Comment   MRSA by PCR NEGATIVE  NEGATIVE  Final   WOUND CULTURE     Status: Normal (Preliminary result)   Collection Time   11/27/11  8:56 AM      Component Value Range Status Comment   Specimen Description WOUND RIGHT LOWER LEG  Final    Special Requests Normal   Final    Gram Stain     Final    Value: NO WBC SEEN     RARE SQUAMOUS EPITHELIAL CELLS PRESENT     FEW GRAM POSITIVE COCCI     IN PAIRS   Culture Culture reincubated for better growth   Final    Report Status PENDING   Incomplete     Anti-infectives     Start     Dose/Rate Route Frequency Ordered Stop   11/27/11 2200   vancomycin (VANCOCIN) IVPB 1000 mg/200 mL premix        1,000 mg 200 mL/hr over 60 Minutes Intravenous Every 12 hours 11/27/11 1343     11/27/11 1000   vancomycin (VANCOCIN) 1,250 mg in sodium chloride 0.9 % 250 mL IVPB  Status:  Discontinued        1,250 mg 166.7 mL/hr over 90 Minutes Intravenous Every 12 hours 11/27/11 0338 11/27/11 1343   11/27/11 0400   piperacillin-tazobactam (ZOSYN) IVPB 3.375 g  Status:  Discontinued        3.375 g 12.5 mL/hr over 240 Minutes Intravenous 3 times per day 11/27/11 0338 11/27/11 0344   11/27/11 0400   Ampicillin-Sulbactam (UNASYN) 3 g in sodium chloride 0.9 % 100 mL IVPB        3 g 100 mL/hr over 60 Minutes Intravenous Every 6 hours 11/27/11 0345      11/26/11 2345   vancomycin (VANCOCIN) IVPB 1000 mg/200 mL premix        1,000 mg 200 mL/hr over 60 Minutes Intravenous  Once 11/26/11 2336 11/27/11 0045   11/26/11 2230   clindamycin (CLEOCIN) IVPB 600 mg        600 mg 100 mL/hr over 30 Minutes Intravenous  Once 11/26/11 2223 11/26/11 2336          Medications:  Scheduled:     . ampicillin-sulbactam (UNASYN) IV  3 g Intravenous Q6H  . aspirin  81 mg Oral Daily  . collagenase   Topical Daily  . digoxin  0.5 mg Intravenous Once  . digoxin  0.5 mg Intravenous Daily  . digoxin  0.125 mg Oral Daily  . hydrocerin   Topical BID  . insulin aspart  0-15 Units Subcutaneous TID WC  . insulin aspart  0-5 Units Subcutaneous QHS  . metoprolol      . niacin  1,000 mg Oral QHS  . potassium chloride  40 mEq Oral Once  . potassium chloride  40 mEq Oral Once  . potassium chloride  40 mEq Oral BID  . simvastatin  20 mg Oral Daily  . sodium chloride  500 mL Intravenous Once  . sodium chloride  500 mL Intravenous Once  . sodium chloride      . vancomycin  1,000 mg Intravenous Q12H  . warfarin  5 mg Oral ONCE-1800  . DISCONTD: insulin aspart  0-3 Units Subcutaneous Q4H    Assessment: [name] admitted altered mental status and RLE wound.   Admit Complaint:  Anticoagulation: Couamdin for h/o afib. INR 2.19. CBC stable  Goal of Therapy:  INR 2-3  Plan:  1. Warfarin 5mg  tonight  Pasty Spillers 11/28/2011,2:07 PM

## 2011-11-28 NOTE — Progress Notes (Signed)
Pts central line d/c'd , pt tol well.  D,/cd pts foley cath, pt tol well. Called report to Ray, RN , will transfer pt to [id number], Pt denies pain. Vs. Stable.

## 2011-11-28 NOTE — Progress Notes (Signed)
CSW spoke with pt daughter and addressed questions around home health concerns for d/c planning. Will follow up with pt daughter as pt progresses and will refer to [facility], as appropriate.  Baxter Flattery, MSW [phone]

## 2011-11-29 DIAGNOSIS — M7989 Other specified soft tissue disorders: Secondary | ICD-10-CM

## 2011-11-29 LAB — CBC
HCT: 29.7 % — ABNORMAL LOW (ref 39.0–52.0)
Hemoglobin: 10.2 g/dL — ABNORMAL LOW (ref 13.0–17.0)
MCHC: 34.3 g/dL (ref 30.0–36.0)
RDW: 15.7 % — ABNORMAL HIGH (ref 11.5–15.5)
WBC: 20 10*3/uL — ABNORMAL HIGH (ref 4.0–10.5)

## 2011-11-29 LAB — BASIC METABOLIC PANEL
Chloride: 105 mEq/L (ref 96–112)
GFR calc Af Amer: >90 mL/min (ref >90)
GFR calc non Af Amer: 86 mL/min — ABNORMAL LOW (ref >90)
Potassium: 3.8 mEq/L (ref 3.5–5.1)
Sodium: 135 mEq/L (ref 135–145)

## 2011-11-29 LAB — PROTIME-INR: INR: 1.85 — ABNORMAL HIGH (ref 0.00–1.49)

## 2011-11-29 MED ORDER — WARFARIN SODIUM 5 MG PO TABS
5.0000 mg | ORAL_TABLET | Freq: Once | ORAL | Status: AC
  Administered 2011-11-29: 5 mg via ORAL
  Filled 2011-11-29: qty 1

## 2011-11-29 MED ORDER — METOPROLOL SUCCINATE ER 50 MG PO TB24
50.0000 mg | ORAL_TABLET | Freq: Two times a day (BID) | ORAL | Status: DC
  Administered 2011-11-29 – 2011-12-01 (×5): 50 mg via ORAL
  Filled 2011-11-29 (×7): qty 1

## 2011-11-29 NOTE — Progress Notes (Signed)
PATIENT DETAILS Name: [name] [name] Age: 78 y.o. Sex: male Date of Birth: 1925/10/31 Admit Date: 11/26/2011 JXB:JYNW,GNFAOZH ALLEN, MD  Subjective: Better. Has no major complaints this morning.  Objective: Vital signs in last 24 hours: Filed Vitals:   11/28/11 1845 11/28/11 2200 11/29/11 0500 11/29/11 1400  BP: 164/76 157/80 128/76 162/82  Pulse: 111 93 92 85  Temp: 97.9 F (36.6 C) 98.8 F (37.1 C) 99 F (37.2 C) 98 F (36.7 C)  TempSrc: Oral   Oral  Resp: 19 21 18 18   Height:      Weight:      SpO2: 97% 97% 94% 99%    Weight change:   Body mass index is 29.63 kg/(m^2).  Intake/Output from previous day:  Intake/Output Summary (Last 24 hours) at 11/29/11 1528 Last data filed at 11/29/11 1300  Gross per 24 hour  Intake    360 ml  Output   2200 ml  Net  -1840 ml    PHYSICAL EXAM: Gen Exam: Awake and alert with clear speech.   Neck: Supple, No JVD.   Chest: B/L Clear.   CVS: S1 S2 Regular, no murmurs.  Abdomen: soft, BS +, non tender, non distended. Extremities: Chronic venous stasis changes in his lateral lower extremities, significantly reduced erythema when compared to the marked area, right leg ulcers seen. Neurologic: Non Focal.   Skin: No Rash.   Wounds: N/A.    CONSULTS:  none  LAB RESULTS: CBC  Lab 11/29/11 0605 11/28/11 0950 11/27/11 0350 11/26/11 2207  WBC 20.0* 23.7* 30.1* 12.0*  HGB 10.2* 10.7* 10.7* 11.0*  HCT 29.7* 31.6* 31.6* 32.1*  PLT 132* 125* 142* 131*  MCV 95.8 97.2 97.2 97.9  MCH 32.9 32.9 32.9 33.5  MCHC 34.3 33.9 33.9 34.3  RDW 15.7* 15.9* 15.4 15.2  LYMPHSABS -- -- 0.6* 0.3*  MONOABS -- -- 1.8* 0.3  EOSABS -- -- 0.0 0.0  BASOSABS -- -- 0.0 0.0  BANDABS -- -- -- --    Chemistries   Lab 11/29/11 0605 11/28/11 0950 11/27/11 0350 11/26/11 2223  NA 135 136 136 135  K 3.8 3.2* 3.1* 3.3*  CL 105 105 105 100  CO2 22 23 20 27   GLUCOSE 97 100* 117* 94  BUN 8 10 11 9   CREATININE 0.67 0.68 0.85 0.86  CALCIUM 7.8* 7.8* 7.6*  8.8  MG -- 1.5 -- --    GFR Estimated Creatinine Clearance: 83.8 ml/min (by C-G formula based on Cr of 0.67).  Coagulation profile  Lab 11/29/11 0605 11/28/11 0950 11/28/11 0410 11/26/11 2207  INR 1.85* 2.19* 2.52* 1.90*  PROTIME -- -- -- --    Cardiac Enzymes  Lab 11/26/11 2222  CKMB --  TROPONINI <0.30  MYOGLOBIN --    No components found with this basename: POCBNP:3 No results found for this basename: DDIMER:2 in the last 72 hours No results found for this basename: HGBA1C:2 in the last 72 hours No results found for this basename: CHOL:2,HDL:2,LDLCALC:2,TRIG:2,CHOLHDL:2,LDLDIRECT:2 in the last 72 hours  Basename 11/27/11 0330  TSH 1.293  T4TOTAL --  T3FREE --  THYROIDAB --   No results found for this basename: VITAMINB12:2,FOLATE:2,FERRITIN:2,TIBC:2,IRON:2,RETICCTPCT:2 in the last 72 hours No results found for this basename: LIPASE:2,AMYLASE:2 in the last 72 hours  Urine Studies No results found for this basename: UACOL:2,UAPR:2,USPG:2,UPH:2,UTP:2,UGL:2,UKET:2,UBIL:2,UHGB:2,UNIT:2,UROB:2,ULEU:2,UEPI:2,UWBC:2,URBC:2,UBAC:2,CAST:2,CRYS:2,UCOM:2,BILUA:2 in the last 72 hours  MICROBIOLOGY: Recent Results (from the past 240 hour(s))  URINE CULTURE     Status: Normal   Collection Time   11/26/11  9:06  PM      Component Value Range Status Comment   Specimen Description URINE, CATHETERIZED   Final    Special Requests NONE   Final    Setup Time [id number]   Final    Colony Count NO GROWTH   Final    Culture NO GROWTH   Final    Report Status 11/27/2011 FINAL   Final   CULTURE, BLOOD (ROUTINE X 2)     Status: Normal (Preliminary result)   Collection Time   11/26/11 10:00 PM      Component Value Range Status Comment   Specimen Description BLOOD LEFT HAND   Final    Special Requests BOTTLES DRAWN AEROBIC ONLY   Final    Setup Time [id number]   Final    Culture     Final    Value:        BLOOD CULTURE RECEIVED NO GROWTH TO DATE CULTURE WILL BE HELD FOR 5 DAYS  BEFORE ISSUING A FINAL NEGATIVE REPORT   Report Status PENDING   Incomplete   CULTURE, BLOOD (ROUTINE X 2)     Status: Normal (Preliminary result)   Collection Time   11/26/11 10:21 PM      Component Value Range Status Comment   Specimen Description BLOOD RIGHT HAND   Final    Special Requests BOTTLES DRAWN AEROBIC ONLY 10CC   Final    Setup Time [id number]   Final    Culture     Final    Value:        BLOOD CULTURE RECEIVED NO GROWTH TO DATE CULTURE WILL BE HELD FOR 5 DAYS BEFORE ISSUING A FINAL NEGATIVE REPORT   Report Status PENDING   Incomplete   MRSA PCR SCREENING     Status: Normal   Collection Time   11/27/11  2:53 AM      Component Value Range Status Comment   MRSA by PCR NEGATIVE  NEGATIVE  Final   WOUND CULTURE     Status: Normal (Preliminary result)   Collection Time   11/27/11  8:56 AM      Component Value Range Status Comment   Specimen Description WOUND RIGHT LOWER LEG   Final    Special Requests Normal   Final    Gram Stain     Final    Value: NO WBC SEEN     RARE SQUAMOUS EPITHELIAL CELLS PRESENT     FEW GRAM POSITIVE COCCI     IN PAIRS   Culture FEW GROUP B STREP(S.AGALACTIAE)ISOLATED   Final    Report Status PENDING   Incomplete     RADIOLOGY STUDIES/RESULTS: Dg Tibia/fibula Right  11/26/2011  *RADIOLOGY REPORT*  Clinical Data: Cellulitis  RIGHT TIBIA AND FIBULA - 2 VIEW  Comparison: None  Findings: The right tibia and fibula appear intact.  No evidence of acute fracture or subluxation.  No focal bone erosion or sclerosis. No evidence of osteomyelitis.  No focal bone lesion or expansile change.  Bone cortex and trabecular architecture appear intact. Vascular calcifications and calcified phleboliths in the soft tissues.  No radiopaque foreign bodies.  No obvious subcutaneous gas collections.  Mild degenerative changes in the knee.  IMPRESSION: Right tibia and fibula appear intact without evidence of bone erosion or osteomyelitis.  Original Report Authenticated By:  Marlon Pel, M.D.   Ct Head Wo Contrast  11/26/2011  *RADIOLOGY REPORT*  Clinical Data:  Altered mental status after fall this morning.  CT HEAD WITHOUT CONTRAST  CT CERVICAL SPINE WITHOUT CONTRAST  Technique:  Multidetector CT imaging of the head and cervical spine was performed following the standard protocol without intravenous contrast.  Multiplanar CT image reconstructions of the cervical spine were also generated.  Comparison:  CT head 12/31/2008  CT HEAD  Findings: Diffuse bilateral cerebral and cerebellar atrophy.  Focal encephalomalacia in the right frontal lobe consistent with old infarct and stable since the prior study.  Ventricular dilatation consistent with central atrophy.  No mass effect or midline shift. No abnormal extra-axial fluid collections.  Gray-white matter junctions are distinct.  Basal cisterns are not effaced.  No evidence of acute intracranial hemorrhage.  Vascular calcifications.  No depressed skull fractures.  Mild supraorbital and nasal soft tissue swelling.  Mild irregularity of the nasal bones appear stable suggesting old fractures.  Mild mucosal membrane thickening in the paranasal sinuses.  No acute air-fluid levels are appreciated.  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  The no evidence of acute intracranial hemorrhage, mass lesion, or acute infarct.  CT CERVICAL SPINE  Findings: Slight retrolisthesis of left lateral mass of C1 with respect to C2, likely degenerative.  Otherwise normal alignment of the cervical vertebrae and facet joints.  Fusion of the C2-3 facet joint on the left which may be congenital or degenerative. Extensive degenerative changes throughout the facet joints bilaterally.  Degenerative changes throughout the cervical spine with narrowed cervical interspaces and associated endplate hypertrophic changes.  Prominent posterior disc osteophyte complexes at C3-4, C5-6, and C6-7 levels.  Degenerative  changes and bone erosion involving C1-2.  No significant vertebral compression deformity.  No prevertebral soft tissue swelling.  No significant infiltration into the paraspinal soft tissues.  Vascular calcifications in the carotid and vertebral arteries.  IMPRESSION: Diffuse degenerative change throughout the cervical spine with mild retrolisthesis of the left lateral mass of C1 with respect C2, likely degenerative.  No displaced fractures are identified.  Original Report Authenticated By: Marlon Pel, M.D.   Ct Cervical Spine Wo Contrast  11/26/2011  *RADIOLOGY REPORT*  Clinical Data:  Altered mental status after fall this morning.  CT HEAD WITHOUT CONTRAST CT CERVICAL SPINE WITHOUT CONTRAST  Technique:  Multidetector CT imaging of the head and cervical spine was performed following the standard protocol without intravenous contrast.  Multiplanar CT image reconstructions of the cervical spine were also generated.  Comparison:  CT head 12/31/2008  CT HEAD  Findings: Diffuse bilateral cerebral and cerebellar atrophy.  Focal encephalomalacia in the right frontal lobe consistent with old infarct and stable since the prior study.  Ventricular dilatation consistent with central atrophy.  No mass effect or midline shift. No abnormal extra-axial fluid collections.  Gray-white matter junctions are distinct.  Basal cisterns are not effaced.  No evidence of acute intracranial hemorrhage.  Vascular calcifications.  No depressed skull fractures.  Mild supraorbital and nasal soft tissue swelling.  Mild irregularity of the nasal bones appear stable suggesting old fractures.  Mild mucosal membrane thickening in the paranasal sinuses.  No acute air-fluid levels are appreciated.  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  The no evidence of acute intracranial hemorrhage, mass lesion, or acute infarct.  CT CERVICAL SPINE  Findings: Slight retrolisthesis of left lateral  mass of C1 with respect to C2, likely degenerative.  Otherwise normal alignment of the cervical vertebrae and facet joints.  Fusion of the C2-3 facet joint on the left which may be congenital or  degenerative. Extensive degenerative changes throughout the facet joints bilaterally.  Degenerative changes throughout the cervical spine with narrowed cervical interspaces and associated endplate hypertrophic changes.  Prominent posterior disc osteophyte complexes at C3-4, C5-6, and C6-7 levels.  Degenerative changes and bone erosion involving C1-2.  No significant vertebral compression deformity.  No prevertebral soft tissue swelling.  No significant infiltration into the paraspinal soft tissues.  Vascular calcifications in the carotid and vertebral arteries.  IMPRESSION: Diffuse degenerative change throughout the cervical spine with mild retrolisthesis of the left lateral mass of C1 with respect C2, likely degenerative.  No displaced fractures are identified.  Original Report Authenticated By: Marlon Pel, M.D.   Dg Chest Port 1 View  11/27/2011  *RADIOLOGY REPORT*  Clinical Data: Right IJ line placement  PORTABLE CHEST - 1 VIEW  Comparison: 11/26/2011  Findings: Placement of right central venous catheter with tip over the mid SVC region.  No pneumothorax.  Otherwise stable appearance of the chest.  Mild cardiac enlargement and pulmonary vascular prominence.  No blunting of costophrenic angles.  Calcification of the aorta.  IMPRESSION: Interval placement of right subclavian venous catheter with tip over the mid SVC region.  No pneumothorax.  Original Report Authenticated By: Marlon Pel, M.D.   Dg Chest Portable 1 View  11/26/2011  *RADIOLOGY REPORT*  Clinical Data: Fever  PORTABLE CHEST - 1 VIEW  Comparison: 03/30/2010  Findings: Slightly shallow inspiration.  Heart size and pulmonary vascularity are mildly increased.  Findings may suggest early congestive change although some of this could be  attributed to technique.  No perihilar edema.  No focal airspace consolidation in the lungs.  No blunting of costophrenic angles.  No pneumothorax.  IMPRESSION: Mild cardiac enlargement and pulmonary vascular prominence.  No consolidation or edema.  Original Report Authenticated By: Marlon Pel, M.D.    MEDICATIONS: Scheduled Meds:   . ampicillin-sulbactam (UNASYN) IV  3 g Intravenous Q6H  . aspirin  81 mg Oral Daily  . collagenase   Topical Daily  . digoxin  0.5 mg Intravenous Daily  . digoxin  0.125 mg Oral Daily  . hydrocerin   Topical BID  . insulin aspart  0-15 Units Subcutaneous TID WC  . insulin aspart  0-5 Units Subcutaneous QHS  . niacin  1,000 mg Oral QHS  . potassium chloride  40 mEq Oral BID  . simvastatin  20 mg Oral Daily  . vancomycin  1,000 mg Intravenous Q12H  . warfarin  5 mg Oral ONCE-1800  . warfarin  5 mg Oral ONCE-1800  . DISCONTD: sodium chloride  500 mL Intravenous Once  . DISCONTD: sodium chloride  500 mL Intravenous Once   Continuous Infusions:  PRN Meds:.albuterol, ipratropium, metoprolol, ondansetron (ZOFRAN) IV, DISCONTD: norepinephrine (LEVOPHED) Adult infusion, DISCONTD: sodium chloride, DISCONTD: sodium chloride  Antibiotics: Anti-infectives     Start     Dose/Rate Route Frequency Ordered Stop   11/27/11 2200   vancomycin (VANCOCIN) IVPB 1000 mg/200 mL premix        1,000 mg 200 mL/hr over 60 Minutes Intravenous Every 12 hours 11/27/11 1343     11/27/11 1000   vancomycin (VANCOCIN) 1,250 mg in sodium chloride 0.9 % 250 mL IVPB  Status:  Discontinued        1,250 mg 166.7 mL/hr over 90 Minutes Intravenous Every 12 hours 11/27/11 0338 11/27/11 1343   11/27/11 0400   piperacillin-tazobactam (ZOSYN) IVPB 3.375 g  Status:  Discontinued        3.375 g  12.5 mL/hr over 240 Minutes Intravenous 3 times per day 11/27/11 0338 11/27/11 0344   11/27/11 0400   Ampicillin-Sulbactam (UNASYN) 3 g in sodium chloride 0.9 % 100 mL IVPB        3 g 100  mL/hr over 60 Minutes Intravenous Every 6 hours 11/27/11 0345     11/26/11 2345   vancomycin (VANCOCIN) IVPB 1000 mg/200 mL premix        1,000 mg 200 mL/hr over 60 Minutes Intravenous  Once 11/26/11 2336 11/27/11 0045   11/26/11 2230   clindamycin (CLEOCIN) IVPB 600 mg        600 mg 100 mL/hr over 30 Minutes Intravenous  Once 11/26/11 2223 11/26/11 2336          Assessment/Plan: Patient Active Hospital Problem List: Sepsis    Assessment: Resolved, this is secondary to cellulitis.    Plan: Continue to monitor and we'll continue antibiotics.   Cellulitis and abscess of leg, except foot    Assessment: This was probably causing sepsis. Currently is afebrile with decreasing leukocytosis.    Plan: Continue with empiric vancomycin and Unasyn. Will have wound care take a look at the patient as well.  HYPERTENSION    Assessment: Moderately well controlled.    Plan: Resume metoprolol as blood pressure now stable   ATRIAL FIBRILLATION    Assessment: Rate controlled.    Plan: Resume metoprolol, continue digoxin. Coumadin being dosed by pharmacy.   Encephalopathy, toxic    Assessment: This is toxic metabolite encephalopathy from sepsis. This is now resolved.    Plan: Continue antibiotics and we'll continue to monitor.   Hypotension    Assessment: From sepsis. This is now resolved.    Plan: Continue to monitor, will start antihypertensives and see how he does.   Anemia    Assessment: Likely secondary to chronic inflammation from leg wounds.    Plan: H&H remains stable, will monitor periodically.   Thrombocytopenia    Assessment: This is a mild, review of prior old laboratory records does show thrombocytopenia as well. For now we will just monitor.    Plan: Monitor CBC periodically.    dyslipidemia   Assessment: No indication to check LDL this admission, we'll defer further monitoring to outpatient.   Plan: Continue with statin   Disposition: Remain inpatient, will order  physical therapy to see if patient may need home health services on discharge.  DVT Prophylaxis: Not needed as patient on Coumadin  Code Status: Full code  Maretta Bees,  MD. 11/29/2011, 3:28 PM

## 2011-11-29 NOTE — Progress Notes (Signed)
ANTIBIOTIC/ANTICOAGULATION CONSULT NOTE - FOLLOW UP  Pharmacy Consult for Vanco/Unasyn; coumadin Indication: Sepsis with RLE wound. Coumadin for Afib  No Known Allergies  Patient Measurements: Height: 6' (182.9 cm) Weight: 218 lb 7.6 oz (99.1 kg) IBW/kg (Calculated) : 77.6   Vital Signs: Temp: 99 F (37.2 C) (01/05 0500) BP: 128/76 mmHg (01/05 0500) Pulse Rate: 92  (01/05 0500) Intake/Output from previous day: 01/04 0701 - 01/05 0700 In: 1070 [I.V.:770; IV Piggyback:300] Out: 2762 [Urine:2760; Stool:2] Intake/Output from this shift:    Labs:  Basename 11/27/11 0350 11/26/11 2223 11/26/11 2222 11/26/11 2207  HGB 10.7* -- -- 11.0*  HCT 31.6* -- -- 32.1*  PLT 142* -- -- 131*  APTT -- -- -- --  LABPROT -- -- -- 22.1*  INR -- -- -- 1.90*  HEPARINUNFRC -- -- -- --  CREATININE 0.85 0.86 -- --  CKTOTAL -- -- -- --  CKMB -- -- -- --  TROPONINI -- -- <0.30 --   Estimated Creatinine Clearance: 77 ml/min (by C-G formula based on Cr of 0.85).   Medications:  Scheduled:     . ampicillin-sulbactam (UNASYN) IV  3 g Intravenous Q6H  . aspirin  81 mg Oral Daily  . collagenase   Topical Daily  . digoxin  0.5 mg Intravenous Daily  . digoxin  0.125 mg Oral Daily  . hydrocerin   Topical BID  . insulin aspart  0-15 Units Subcutaneous TID WC  . insulin aspart  0-5 Units Subcutaneous QHS  . niacin  1,000 mg Oral QHS  . potassium chloride  40 mEq Oral BID  . simvastatin  20 mg Oral Daily  . vancomycin  1,000 mg Intravenous Q12H  . warfarin  5 mg Oral ONCE-1800  . DISCONTD: sodium chloride  500 mL Intravenous Once  . DISCONTD: sodium chloride  500 mL Intravenous Once    Assessment: [name] [name] admitted altered mental status and RLE wound. Patient on Day 3 antibiotics and on coumadin for afib (on 2.5mg  MF and 5mg  all other days of the week).  INR is 1.85 and last couple INRs have been at goal. Noted 5mg  given on 11/28/11 (Fri)   Goal of Therapy:  INR 2-3 Vancomycin trough  =10-77mcg/ml  Plan:  1) Coumadin 5mg  today 2) Will assess a vancomycin trough based on LOT  Benny Lennert 11/29/2011,10:13 AM

## 2011-11-29 NOTE — Progress Notes (Signed)
Patient had four beats of v-tach following a nebulized Albuterol treatment for wheezing at 0122. Patient was asymptomatic with stable vitals. Critical Care MD on call notified. Will continue to monitor.

## 2011-11-29 NOTE — ED Provider Notes (Signed)
I saw and evaluated the patient, reviewed the resident's note and I agree with the findings and plan.   .Face to face Exam:  General:  Awake HEENT:  Atraumatic Resp:  Normal effort Abd:  Nondistended Neuro:No focal weakness Lymph: No adenopathy   Nelia Shi, MD 11/29/11 1319

## 2011-11-29 NOTE — Progress Notes (Signed)
Bilateral lower extremity venous duplex completed.  Preliminary report is negative for DVT, SVT, or a Baker's cyst. [name] [name] 11/29/2011, 2:16 PM

## 2011-11-30 LAB — CBC
HCT: 29.9 % — ABNORMAL LOW (ref 39.0–52.0)
Hemoglobin: 10.2 g/dL — ABNORMAL LOW (ref 13.0–17.0)
MCH: 32.7 pg (ref 26.0–34.0)
MCHC: 34.1 g/dL (ref 30.0–36.0)
MCV: 95.8 fL (ref 78.0–100.0)
RDW: 15.7 % — ABNORMAL HIGH (ref 11.5–15.5)

## 2011-11-30 LAB — BASIC METABOLIC PANEL
BUN: 6 mg/dL (ref 6–23)
Calcium: 7.9 mg/dL — ABNORMAL LOW (ref 8.4–10.5)
Creatinine, Ser: 0.62 mg/dL (ref 0.50–1.35)
GFR calc non Af Amer: 89 mL/min — ABNORMAL LOW (ref >90)
Glucose, Bld: 83 mg/dL (ref 70–99)

## 2011-11-30 MED ORDER — WARFARIN SODIUM 7.5 MG PO TABS
7.5000 mg | ORAL_TABLET | Freq: Once | ORAL | Status: AC
  Administered 2011-11-30: 7.5 mg via ORAL
  Filled 2011-11-30: qty 1

## 2011-11-30 MED ORDER — POTASSIUM CHLORIDE CRYS ER 20 MEQ PO TBCR
40.0000 meq | EXTENDED_RELEASE_TABLET | Freq: Once | ORAL | Status: AC
  Administered 2011-11-30: 40 meq via ORAL
  Filled 2011-11-30: qty 2

## 2011-11-30 NOTE — Progress Notes (Signed)
Physical Therapy Treatment Patient Details Name: [name] [name] MRN: [id number] DOB: 04-Oct-1925 Today's Date: 11/30/2011  Addendum to PT Eval note:  Telephone conversation with CM on call to alert her of [name]' home discharge barriers.  Narda Amber, PT, MS, DPT

## 2011-11-30 NOTE — Progress Notes (Signed)
Physical Therapy Evaluation Patient Details Name: [name] [name] MRN: [id number] DOB: 14-Apr-1925 Today's Date: 11/30/2011  Problem List:  Patient Active Problem List  Diagnoses  . HYPERLIPIDEMIA  . HYPERTENSION  . MYOCARDIAL INFARCTION, HX OF  . CORONARY ARTERY DISEASE  . ATRIAL FIBRILLATION  . PHARYNGITIS  . ALLERGIC RHINITIS  . DIARRHEA  . TRANSIENT ISCHEMIC ATTACK, HX OF  . Encounter for long-term (current) use of anticoagulants  . Cellulitis and abscess of leg, except foot  . Encephalopathy, toxic  . Hypotension  . Anemia  . Thrombocytopenia  . Hypokalemia  . Leucocytosis  . Sepsis    Past Medical History:  Past Medical History  Diagnosis Date  . Arrhythmia   . Carotid artery occlusion     right total internal artery occlusion  . Coronary artery disease     s/p cardiac cath in 1990s, and cardiolite study  in 2005 showing EF 54%  . Hyperlipidemia   . Hypertension   . TIA (transient ischemic attack)   . Campath-induced atrial fibrillation   . Myocardial infarction 1992 and 1994    history of  . Degenerative disc disease     cervical spine   Past Surgical History:  Past Surgical History  Procedure Date  . Cardiac catheterization 1990s  . Cataract extraction, bilateral     PT Assessment/Plan/Recommendation PT Assessment Clinical Impression Statement: Pt presents following fall, likely due to weakness related to sepsis.  Presents now with significant LE weakness and balance related to prolonged bed rest (>24 hours) that impairs safety and confidence with mobility.  Patient is primary caregiver for impaired wife, and family MUST work with Case Manager to provide 18-24 hour assistance in order to send patient directly home. At present level, pt could return home with RW and HHPT/OT/Nursing services however he cannot reassume caregiver role until his functional mobility improves.  IF family cannot provide for 18-24 hour care, recommend considering short term SNF.  PT  Recommendation/Assessment: Patient will need skilled PT in the acute care venue PT Problem List: Decreased balance;Decreased mobility Barriers to Discharge: Decreased caregiver support PT Therapy Diagnosis : Generalized weakness PT Plan PT Frequency: Min 3X/week PT Treatment/Interventions: DME instruction;Gait training;Functional mobility training;Balance training;Therapeutic activities;Patient/family education PT Recommendation Follow Up Recommendations: Home health PT;Skilled nursing facility Equipment Recommended: Rolling walker with 5" wheels PT Goals  Acute Rehab PT Goals PT Goal Formulation: With patient Time For Goal Achievement: 2 weeks Pt will go Sit to Supine/Side: with modified independence;with HOB 0 degrees PT Goal: Sit to Supine/Side - Progress: Progressing toward goal Pt will go Sit to Stand: with modified independence PT Goal: Sit to Stand - Progress: Progressing toward goal Pt will go Stand to Sit: with modified independence PT Goal: Stand to Sit - Progress: Progressing toward goal Pt will Ambulate: >150 feet;with modified independence;with rolling walker PT Goal: Ambulate - Progress: Progressing toward goal  PT Evaluation Precautions/Restrictions  Precautions Precautions: Fall Required Braces or Orthoses: No Restrictions Weight Bearing Restrictions: No Prior Functioning  Home Living Lives With: Spouse Receives Help From: Family;Personal care attendant (3 hrs/day house cleaning) Type of Home: House Home Layout: One level Home Access: Ramped entrance Home Adaptive Equipment: None Prior Function Level of Independence: Independent with basic ADLs;Independent with gait;Other (comment) (primary caregiver for elderly wife) Driving: Yes Vocation: Retired Financial risk analyst Arousal/Alertness: Awake/alert Overall Cognitive Status: Appears within functional limits for tasks assessed Orientation Level: Oriented X4 Sensation/Coordination Coordination Gross Motor  Movements are Fluid and Coordinated: Yes Fine Motor Movements are Fluid  and Coordinated: Not tested Extremity Assessment RUE Assessment RUE Assessment: Within Functional Limits LUE Assessment LUE Assessment: Within Functional Limits RLE Assessment RLE Assessment: Within Functional Limits LLE Assessment LLE Assessment: Within Functional Limits Mobility (including Balance) Bed Mobility Bed Mobility: Yes Supine to Sit: 5: Supervision;HOB flat Sit to Supine - Right: 5: Supervision;HOB flat Transfers Transfers: Yes Sit to Stand: 4: Min assist;With upper extremity assist;From bed Sit to Stand Details (indicate cue type and reason): manual assist to lift off and steady initially Stand to Sit: 4: Min assist;To bed;To chair/3-in-1;With armrests;With upper extremity assist Stand to Sit Details: assist to align and to slow descent to chair Ambulation/Gait Ambulation/Gait: Yes Ambulation/Gait Assistance: 3: Mod assist Ambulation/Gait Assistance Details (indicate cue type and reason): assist for stability, to prevent LOB, to keep eyes forward, to lengthen stride Ambulation Distance (Feet): 75 Feet Assistive device: 1 person hand held assist Gait Pattern: Decreased stride length;Trunk flexed Gait velocity: slowed Stairs: No Wheelchair Mobility Wheelchair Mobility: No  Posture/Postural Control Posture/Postural Control: No significant limitations Balance Balance Assessed: Yes Static Standing Balance Static Standing - Level of Assistance: 4: Min assist (for narrowed BOS eyes open) Exercise    End of Session PT - End of Session Equipment Utilized During Treatment: Gait belt Activity Tolerance: Patient tolerated treatment well Patient left: in chair;with call bell in reach;with family/visitor present Nurse Communication: Mobility status for ambulation General Behavior During Session: [facility] for tasks performed Cognition: [facility] for tasks performed  Dennis Bast 11/30/2011, 2:27  PM

## 2011-11-30 NOTE — Progress Notes (Signed)
PATIENT DETAILS Name: [name] [name] Age: 78 y.o. Sex: male Date of Birth: 1925/09/27 Admit Date: 11/26/2011 [id number] ALLEN, MD  Subjective: Better.   Objective: Vital signs in last 24 hours: Filed Vitals:   11/29/11 2100 11/29/11 2215 11/30/11 0500 11/30/11 0720  BP: 164/81 164/72 144/78 167/75  Pulse: 86 88 84 77  Temp: 98.9 F (37.2 C)  98.8 F (37.1 C)   TempSrc:      Resp: 21  20   Height:      Weight:      SpO2: 98%  95%     Weight change:   Body mass index is 29.63 kg/(m^2).  Intake/Output from previous day:  Intake/Output Summary (Last 24 hours) at 11/30/11 1437 Last data filed at 11/30/11 1100  Gross per 24 hour  Intake    900 ml  Output   1151 ml  Net   -251 ml    PHYSICAL EXAM: Gen Exam: Awake and alert with clear speech.   Neck: Supple, No JVD.   Chest: B/L Clear.   CVS: S1 S2 Regular, no murmurs.  Abdomen: soft, BS +, non tender, non distended. Extremities: Chronic venous stasis changes in his lateral lower extremities, significantly reduced erythema when compared to the marked area, right leg ulcers seen. Neurologic: Non Focal.   Skin: No Rash.   Wounds: N/A.    CONSULTS:  none  LAB RESULTS: CBC  Lab 11/30/11 0500 11/29/11 0605 11/28/11 0950 11/27/11 0350 11/26/11 2207  WBC 14.3* 20.0* 23.7* 30.1* 12.0*  HGB 10.2* 10.2* 10.7* 10.7* 11.0*  HCT 29.9* 29.7* 31.6* 31.6* 32.1*  PLT 127* 132* 125* 142* 131*  MCV 95.8 95.8 97.2 97.2 97.9  MCH 32.7 32.9 32.9 32.9 33.5  MCHC 34.1 34.3 33.9 33.9 34.3  RDW 15.7* 15.7* 15.9* 15.4 15.2  LYMPHSABS -- -- -- 0.6* 0.3*  MONOABS -- -- -- 1.8* 0.3  EOSABS -- -- -- 0.0 0.0  BASOSABS -- -- -- 0.0 0.0  BANDABS -- -- -- -- --    Chemistries   Lab 11/30/11 0500 11/29/11 0605 11/28/11 0950 11/27/11 0350 11/26/11 2223  NA 133* 135 136 136 135  K 3.4* 3.8 3.2* 3.1* 3.3*  CL 101 105 105 105 100  CO2 25 22 23 20 27   GLUCOSE 83 97 100* 117* 94  BUN 6 8 10 11 9   CREATININE 0.62 0.67 0.68 0.85  0.86  CALCIUM 7.9* 7.8* 7.8* 7.6* 8.8  MG -- -- 1.5 -- --    GFR Estimated Creatinine Clearance: 83.8 ml/min (by C-G formula based on Cr of 0.62).  Coagulation profile  Lab 11/30/11 0500 11/29/11 0605 11/28/11 0950 11/28/11 0410 11/26/11 2207  INR 1.65* 1.85* 2.19* 2.52* 1.90*  PROTIME -- -- -- -- --    Cardiac Enzymes  Lab 11/26/11 2222  CKMB --  TROPONINI <0.30  MYOGLOBIN --    No components found with this basename: POCBNP:3 No results found for this basename: DDIMER:2 in the last 72 hours No results found for this basename: HGBA1C:2 in the last 72 hours No results found for this basename: CHOL:2,HDL:2,LDLCALC:2,TRIG:2,CHOLHDL:2,LDLDIRECT:2 in the last 72 hours No results found for this basename: TSH,T4TOTAL,FREET3,T3FREE,THYROIDAB in the last 72 hours No results found for this basename: VITAMINB12:2,FOLATE:2,FERRITIN:2,TIBC:2,IRON:2,RETICCTPCT:2 in the last 72 hours No results found for this basename: LIPASE:2,AMYLASE:2 in the last 72 hours  Urine Studies No results found for this basename: UACOL:2,UAPR:2,USPG:2,UPH:2,UTP:2,UGL:2,UKET:2,UBIL:2,UHGB:2,UNIT:2,UROB:2,ULEU:2,UEPI:2,UWBC:2,URBC:2,UBAC:2,CAST:2,CRYS:2,UCOM:2,BILUA:2 in the last 72 hours  MICROBIOLOGY: Recent Results (from the past 240 hour(s))  URINE CULTURE  Status: Normal   Collection Time   11/26/11  9:06 PM      Component Value Range Status Comment   Specimen Description URINE, CATHETERIZED   Final    Special Requests NONE   Final    Setup Time [id number]   Final    Colony Count NO GROWTH   Final    Culture NO GROWTH   Final    Report Status 11/27/2011 FINAL   Final   CULTURE, BLOOD (ROUTINE X 2)     Status: Normal (Preliminary result)   Collection Time   11/26/11 10:00 PM      Component Value Range Status Comment   Specimen Description BLOOD LEFT HAND   Final    Special Requests BOTTLES DRAWN AEROBIC ONLY   Final    Setup Time [id number]   Final    Culture     Final    Value:         BLOOD CULTURE RECEIVED NO GROWTH TO DATE CULTURE WILL BE HELD FOR 5 DAYS BEFORE ISSUING A FINAL NEGATIVE REPORT   Report Status PENDING   Incomplete   CULTURE, BLOOD (ROUTINE X 2)     Status: Normal (Preliminary result)   Collection Time   11/26/11 10:21 PM      Component Value Range Status Comment   Specimen Description BLOOD RIGHT HAND   Final    Special Requests BOTTLES DRAWN AEROBIC ONLY 10CC   Final    Setup Time [id number]   Final    Culture     Final    Value:        BLOOD CULTURE RECEIVED NO GROWTH TO DATE CULTURE WILL BE HELD FOR 5 DAYS BEFORE ISSUING A FINAL NEGATIVE REPORT   Report Status PENDING   Incomplete   MRSA PCR SCREENING     Status: Normal   Collection Time   11/27/11  2:53 AM      Component Value Range Status Comment   MRSA by PCR NEGATIVE  NEGATIVE  Final   WOUND CULTURE     Status: Normal (Preliminary result)   Collection Time   11/27/11  8:56 AM      Component Value Range Status Comment   Specimen Description WOUND RIGHT LOWER LEG   Final    Special Requests Normal   Final    Gram Stain     Final    Value: NO WBC SEEN     RARE SQUAMOUS EPITHELIAL CELLS PRESENT     FEW GRAM POSITIVE COCCI     IN PAIRS   Culture     Final    Value: FEW STAPHYLOCOCCUS AUREUS     Note: RIFAMPIN AND GENTAMICIN SHOULD NOT BE USED AS SINGLE DRUGS FOR TREATMENT OF STAPH INFECTIONS.     FEW GROUP B STREP(S.AGALACTIAE)ISOLATED     Note: TESTING AGAINST S. AGALACTIAE NOT ROUTINELY PERFORMED DUE TO PREDICTABILITY OF AMP/PEN/VAN SUSCEPTIBILITY.   Report Status PENDING   Incomplete     RADIOLOGY STUDIES/RESULTS: Dg Tibia/fibula Right  11/26/2011  *RADIOLOGY REPORT*  Clinical Data: Cellulitis  RIGHT TIBIA AND FIBULA - 2 VIEW  Comparison: None  Findings: The right tibia and fibula appear intact.  No evidence of acute fracture or subluxation.  No focal bone erosion or sclerosis. No evidence of osteomyelitis.  No focal bone lesion or expansile change.  Bone cortex and trabecular architecture  appear intact. Vascular calcifications and calcified phleboliths in the soft tissues.  No radiopaque foreign bodies.  No obvious  subcutaneous gas collections.  Mild degenerative changes in the knee.  IMPRESSION: Right tibia and fibula appear intact without evidence of bone erosion or osteomyelitis.  Original Report Authenticated By: Marlon Pel, M.D.   Ct Head Wo Contrast  11/26/2011  *RADIOLOGY REPORT*  Clinical Data:  Altered mental status after fall this morning.  CT HEAD WITHOUT CONTRAST CT CERVICAL SPINE WITHOUT CONTRAST  Technique:  Multidetector CT imaging of the head and cervical spine was performed following the standard protocol without intravenous contrast.  Multiplanar CT image reconstructions of the cervical spine were also generated.  Comparison:  CT head 12/31/2008  CT HEAD  Findings: Diffuse bilateral cerebral and cerebellar atrophy.  Focal encephalomalacia in the right frontal lobe consistent with old infarct and stable since the prior study.  Ventricular dilatation consistent with central atrophy.  No mass effect or midline shift. No abnormal extra-axial fluid collections.  Gray-white matter junctions are distinct.  Basal cisterns are not effaced.  No evidence of acute intracranial hemorrhage.  Vascular calcifications.  No depressed skull fractures.  Mild supraorbital and nasal soft tissue swelling.  Mild irregularity of the nasal bones appear stable suggesting old fractures.  Mild mucosal membrane thickening in the paranasal sinuses.  No acute air-fluid levels are appreciated.  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  The no evidence of acute intracranial hemorrhage, mass lesion, or acute infarct.  CT CERVICAL SPINE  Findings: Slight retrolisthesis of left lateral mass of C1 with respect to C2, likely degenerative.  Otherwise normal alignment of the cervical vertebrae and facet joints.  Fusion of the C2-3 facet joint on the left which  may be congenital or degenerative. Extensive degenerative changes throughout the facet joints bilaterally.  Degenerative changes throughout the cervical spine with narrowed cervical interspaces and associated endplate hypertrophic changes.  Prominent posterior disc osteophyte complexes at C3-4, C5-6, and C6-7 levels.  Degenerative changes and bone erosion involving C1-2.  No significant vertebral compression deformity.  No prevertebral soft tissue swelling.  No significant infiltration into the paraspinal soft tissues.  Vascular calcifications in the carotid and vertebral arteries.  IMPRESSION: Diffuse degenerative change throughout the cervical spine with mild retrolisthesis of the left lateral mass of C1 with respect C2, likely degenerative.  No displaced fractures are identified.  Original Report Authenticated By: Marlon Pel, M.D.   Ct Cervical Spine Wo Contrast  11/26/2011  *RADIOLOGY REPORT*  Clinical Data:  Altered mental status after fall this morning.  CT HEAD WITHOUT CONTRAST CT CERVICAL SPINE WITHOUT CONTRAST  Technique:  Multidetector CT imaging of the head and cervical spine was performed following the standard protocol without intravenous contrast.  Multiplanar CT image reconstructions of the cervical spine were also generated.  Comparison:  CT head 12/31/2008  CT HEAD  Findings: Diffuse bilateral cerebral and cerebellar atrophy.  Focal encephalomalacia in the right frontal lobe consistent with old infarct and stable since the prior study.  Ventricular dilatation consistent with central atrophy.  No mass effect or midline shift. No abnormal extra-axial fluid collections.  Gray-white matter junctions are distinct.  Basal cisterns are not effaced.  No evidence of acute intracranial hemorrhage.  Vascular calcifications.  No depressed skull fractures.  Mild supraorbital and nasal soft tissue swelling.  Mild irregularity of the nasal bones appear stable suggesting old fractures.  Mild mucosal  membrane thickening in the paranasal sinuses.  No acute air-fluid levels are appreciated.  IMPRESSION: Chronic atrophy and small vessel ischemic changes with old right  frontal infarct.  The no evidence of acute intracranial hemorrhage, mass lesion, or acute infarct.  CT CERVICAL SPINE  Findings: Slight retrolisthesis of left lateral mass of C1 with respect to C2, likely degenerative.  Otherwise normal alignment of the cervical vertebrae and facet joints.  Fusion of the C2-3 facet joint on the left which may be congenital or degenerative. Extensive degenerative changes throughout the facet joints bilaterally.  Degenerative changes throughout the cervical spine with narrowed cervical interspaces and associated endplate hypertrophic changes.  Prominent posterior disc osteophyte complexes at C3-4, C5-6, and C6-7 levels.  Degenerative changes and bone erosion involving C1-2.  No significant vertebral compression deformity.  No prevertebral soft tissue swelling.  No significant infiltration into the paraspinal soft tissues.  Vascular calcifications in the carotid and vertebral arteries.  IMPRESSION: Diffuse degenerative change throughout the cervical spine with mild retrolisthesis of the left lateral mass of C1 with respect C2, likely degenerative.  No displaced fractures are identified.  Original Report Authenticated By: Marlon Pel, M.D.   Dg Chest Port 1 View  11/27/2011  *RADIOLOGY REPORT*  Clinical Data: Right IJ line placement  PORTABLE CHEST - 1 VIEW  Comparison: 11/26/2011  Findings: Placement of right central venous catheter with tip over the mid SVC region.  No pneumothorax.  Otherwise stable appearance of the chest.  Mild cardiac enlargement and pulmonary vascular prominence.  No blunting of costophrenic angles.  Calcification of the aorta.  IMPRESSION: Interval placement of right subclavian venous catheter with tip over the mid SVC region.  No pneumothorax.  Original Report Authenticated By: Marlon Pel, M.D.   Dg Chest Portable 1 View  11/26/2011  *RADIOLOGY REPORT*  Clinical Data: Fever  PORTABLE CHEST - 1 VIEW  Comparison: 03/30/2010  Findings: Slightly shallow inspiration.  Heart size and pulmonary vascularity are mildly increased.  Findings may suggest early congestive change although some of this could be attributed to technique.  No perihilar edema.  No focal airspace consolidation in the lungs.  No blunting of costophrenic angles.  No pneumothorax.  IMPRESSION: Mild cardiac enlargement and pulmonary vascular prominence.  No consolidation or edema.  Original Report Authenticated By: Marlon Pel, M.D.    MEDICATIONS: Scheduled Meds:    . ampicillin-sulbactam (UNASYN) IV  3 g Intravenous Q6H  . aspirin  81 mg Oral Daily  . collagenase   Topical Daily  . digoxin  0.125 mg Oral Daily  . hydrocerin   Topical BID  . metoprolol  50 mg Oral BID  . niacin  1,000 mg Oral QHS  . simvastatin  20 mg Oral Daily  . vancomycin  1,000 mg Intravenous Q12H  . warfarin  5 mg Oral ONCE-1800  . warfarin  7.5 mg Oral ONCE-1800  . DISCONTD: insulin aspart  0-15 Units Subcutaneous TID WC  . DISCONTD: insulin aspart  0-5 Units Subcutaneous QHS   Continuous Infusions:  PRN Meds:.albuterol, ipratropium, metoprolol, ondansetron (ZOFRAN) IV  Antibiotics: Anti-infectives     Start     Dose/Rate Route Frequency Ordered Stop   11/27/11 2200   vancomycin (VANCOCIN) IVPB 1000 mg/200 mL premix        1,000 mg 200 mL/hr over 60 Minutes Intravenous Every 12 hours 11/27/11 1343     11/27/11 1000   vancomycin (VANCOCIN) 1,250 mg in sodium chloride 0.9 % 250 mL IVPB  Status:  Discontinued        1,250 mg 166.7 mL/hr over 90 Minutes Intravenous Every 12 hours 11/27/11 0338 11/27/11 1343  11/27/11 0400   piperacillin-tazobactam (ZOSYN) IVPB 3.375 g  Status:  Discontinued        3.375 g 12.5 mL/hr over 240 Minutes Intravenous 3 times per day 11/27/11 0338 11/27/11 0344   11/27/11 0400    Ampicillin-Sulbactam (UNASYN) 3 g in sodium chloride 0.9 % 100 mL IVPB        3 g 100 mL/hr over 60 Minutes Intravenous Every 6 hours 11/27/11 0345     11/26/11 2345   vancomycin (VANCOCIN) IVPB 1000 mg/200 mL premix        1,000 mg 200 mL/hr over 60 Minutes Intravenous  Once 11/26/11 2336 11/27/11 0045   11/26/11 2230   clindamycin (CLEOCIN) IVPB 600 mg        600 mg 100 mL/hr over 30 Minutes Intravenous  Once 11/26/11 2223 11/26/11 2336          Assessment/Plan: Patient Active Hospital Problem List: Sepsis    Assessment: Resolved, this is secondary to cellulitis.    Plan: Continue to monitor and we'll continue antibiotics-Vanco and Unasyn (day 4)  Cellulitis and abscess of leg, except foot    Assessment: This was probably causing sepsis. Currently is afebrile with decreasing leukocytosis.    Plan: Continue with empiric vancomycin and Unasyn. Will have wound care take a look at the patient as well.  HYPERTENSION    Assessment: Moderately well controlled.    Plan: Resume metoprolol as blood pressure now stable   ATRIAL FIBRILLATION    Assessment: Rate controlled.    Plan: Resume metoprolol, continue digoxin. Coumadin being dosed by pharmacy. INR currently subtherapeutic.   Encephalopathy, toxic    Assessment: This is toxic metabolite encephalopathy from sepsis. This is now resolved.    Plan: Continue antibiotics and we'll continue to monitor.   Hypotension    Assessment: From sepsis. This is now resolved.    Plan: Continue to monitor, no further episodes of hypotension while on Lopressor.   Anemia    Assessment: Likely secondary to chronic inflammation from leg wounds.    Plan: H&H remains stable, will monitor periodically.   Thrombocytopenia    Assessment: This is a mild, review of prior old laboratory records does show thrombocytopenia as well. For now we will just monitor.    Plan: Monitor CBC periodically.    dyslipidemia   Assessment: No indication to check  LDL this admission, we'll defer further monitoring to outpatient.   Plan: Continue with statin   Disposition: Remain inpatient  DVT Prophylaxis: Not needed as patient on Coumadin  Code Status: Full code  Maretta Bees,  MD. 11/30/2011, 2:37 PM

## 2011-11-30 NOTE — Plan of Care (Signed)
Problem: Phase II Progression Outcomes Goal: Progress activity as tolerated unless otherwise ordered Outcome: Progressing PT Eval completed

## 2011-11-30 NOTE — Progress Notes (Signed)
ANTIBIOTIC/ANTICOAGULATION CONSULT NOTE - FOLLOW UP  Pharmacy Consult for Vanco/Unasyn; coumadin Indication: Sepsis with RLE wound. Coumadin for Afib  No Known Allergies  Patient Measurements: Height: 6' (182.9 cm) Weight: 218 lb 7.6 oz (99.1 kg) IBW/kg (Calculated) : 77.6   Vital Signs: Temp: 98.8 F (37.1 C) (01/06 0500) BP: 144/78 mmHg (01/06 0500) Pulse Rate: 84  (01/06 0500) Intake/Output from previous day: 01/05 0701 - 01/06 0700 In: 1020 [P.O.:720; IV Piggyback:300] Out: 1351 [Urine:1350; Stool:1] Intake/Output from this shift:    Labs:  Basename 11/27/11 0350 11/26/11 2223 11/26/11 2222 11/26/11 2207  HGB 10.7* -- -- 11.0*  HCT 31.6* -- -- 32.1*  PLT 142* -- -- 131*  APTT -- -- -- --  LABPROT -- -- -- 22.1*  INR -- -- -- 1.90*  HEPARINUNFRC -- -- -- --  CREATININE 0.85 0.86 -- --  CKTOTAL -- -- -- --  CKMB -- -- -- --  TROPONINI -- -- <0.30 --   Estimated Creatinine Clearance: 77 ml/min (by C-G formula based on Cr of 0.85).   Medications:  Scheduled:     . ampicillin-sulbactam (UNASYN) IV  3 g Intravenous Q6H  . aspirin  81 mg Oral Daily  . collagenase   Topical Daily  . digoxin  0.125 mg Oral Daily  . hydrocerin   Topical BID  . metoprolol  50 mg Oral BID  . niacin  1,000 mg Oral QHS  . simvastatin  20 mg Oral Daily  . vancomycin  1,000 mg Intravenous Q12H  . warfarin  5 mg Oral ONCE-1800  . DISCONTD: insulin aspart  0-15 Units Subcutaneous TID WC  . DISCONTD: insulin aspart  0-5 Units Subcutaneous QHS    Assessment: [name] admitted altered mental status and RLE wound. Patient on Day 4 antibiotics and on coumadin for afib (on 2.5mg  MF and 5mg  all other days of the week).  INR is 1.65 and trending down. Noted wound cx with Group B strep (no sensitivities yet)  Goal of Therapy:  INR 2-3 Vancomycin trough =10-76mcg/ml  Plan:  1) Coumadin  7.5mg  today 2) Will follow cultures  Benny Lennert 11/30/2011,8:28 AM

## 2011-12-01 ENCOUNTER — Inpatient Hospital Stay (HOSPITAL_COMMUNITY): Payer: Medicare Other

## 2011-12-01 LAB — CLOSTRIDIUM DIFFICILE BY PCR: Toxigenic C. Difficile by PCR: NEGATIVE

## 2011-12-01 LAB — PROTIME-INR
INR: 1.99 — ABNORMAL HIGH (ref 0.00–1.49)
Prothrombin Time: 22.9 seconds — ABNORMAL HIGH (ref 11.6–15.2)

## 2011-12-01 LAB — BASIC METABOLIC PANEL
Calcium: 7.8 mg/dL — ABNORMAL LOW (ref 8.4–10.5)
GFR calc Af Amer: >90 mL/min (ref >90)
GFR calc non Af Amer: 89 mL/min — ABNORMAL LOW (ref >90)
Potassium: 3.8 mEq/L (ref 3.5–5.1)
Sodium: 129 mEq/L — ABNORMAL LOW (ref 135–145)

## 2011-12-01 LAB — WOUND CULTURE: Gram Stain: NONE SEEN

## 2011-12-01 MED ORDER — WARFARIN SODIUM 7.5 MG PO TABS
7.5000 mg | ORAL_TABLET | Freq: Once | ORAL | Status: AC
  Administered 2011-12-01: 7.5 mg via ORAL
  Filled 2011-12-01: qty 1

## 2011-12-01 NOTE — Progress Notes (Signed)
CSW spoke with pt daughter, who is looking into insurance coverage for home health aide to provide 18 hour care, states she prefers to keep pt at home with his wife if possible. Pt daughter will consider SNF if in home health is not affordable.  This CSW handing off to [facility] CSW [place]).  Baxter Flattery, MSW [phone]

## 2011-12-01 NOTE — Progress Notes (Signed)
PATIENT DETAILS Name: JOIE REAMER Age: 78 y.o. Sex: male Date of Birth: 07/25/1925 Admit Date: 11/26/2011 ZOX:WRUE,[id number] ALLEN, MD  Subjective: Better. Some rhonchi today- but not in any distress.  Objective: Vital signs in last 24 hours: Filed Vitals:   11/30/11 2100 12/01/11 0500 12/01/11 1131 12/01/11 1400  BP: 162/80 158/78 136/69 128/78  Pulse: 83 75 85 83  Temp: 98.9 F (37.2 C) 98.2 F (36.8 C)  98.6 F (37 C)  TempSrc:      Resp: 20 20  20   Height:      Weight:      SpO2: 98% 96%  95%    Weight change:   Body mass index is 29.63 kg/(m^2).  Intake/Output from previous day:  Intake/Output Summary (Last 24 hours) at 12/01/11 1636 Last data filed at 12/01/11 1600  Gross per 24 hour  Intake    420 ml  Output   1828 ml  Net  -1408 ml    PHYSICAL EXAM: Gen Exam: Awake and alert with clear speech.   Neck: Supple, No JVD.   Chest: B/L Clear.  Few scattered rhonchi. CVS: S1 S2 Regular, no murmurs.  Abdomen: soft, BS +, non tender, non distended. Extremities: Chronic venous stasis changes in his lateral lower extremities, significantly reduced erythema when compared to the marked area, right leg ulcers seen. Neurologic: Non Focal.   Skin: No Rash.   Wounds: N/A.    CONSULTS:  none  LAB RESULTS: CBC  Lab 11/30/11 0500 11/29/11 0605 11/28/11 0950 11/27/11 0350 11/26/11 2207  WBC 14.3* 20.0* 23.7* 30.1* 12.0*  HGB 10.2* 10.2* 10.7* 10.7* 11.0*  HCT 29.9* 29.7* 31.6* 31.6* 32.1*  PLT 127* 132* 125* 142* 131*  MCV 95.8 95.8 97.2 97.2 97.9  MCH 32.7 32.9 32.9 32.9 33.5  MCHC 34.1 34.3 33.9 33.9 34.3  RDW 15.7* 15.7* 15.9* 15.4 15.2  LYMPHSABS -- -- -- 0.6* 0.3*  MONOABS -- -- -- 1.8* 0.3  EOSABS -- -- -- 0.0 0.0  BASOSABS -- -- -- 0.0 0.0  BANDABS -- -- -- -- --    Chemistries   Lab 12/01/11 0520 11/30/11 0500 11/29/11 0605 11/28/11 0950 11/27/11 0350  NA 129* 133* 135 136 136  K 3.8 3.4* 3.8 3.2* 3.1*  CL 98 101 105 105 105  CO2 25 25 22 23  20   GLUCOSE 88 83 97 100* 117*  BUN 6 6 8 10 11   CREATININE 0.62 0.62 0.67 0.68 0.85  CALCIUM 7.8* 7.9* 7.8* 7.8* 7.6*  MG -- -- -- 1.5 --    GFR Estimated Creatinine Clearance: 83.8 ml/min (by C-G formula based on Cr of 0.62).  Coagulation profile  Lab 12/01/11 0520 11/30/11 0500 11/29/11 0605 11/28/11 0950 11/28/11 0410  INR 1.99* 1.65* 1.85* 2.19* 2.52*  PROTIME -- -- -- -- --    Cardiac Enzymes  Lab 11/26/11 2222  CKMB --  TROPONINI <0.30  MYOGLOBIN --    No components found with this basename: POCBNP:3 No results found for this basename: DDIMER:2 in the last 72 hours No results found for this basename: HGBA1C:2 in the last 72 hours No results found for this basename: CHOL:2,HDL:2,LDLCALC:2,TRIG:2,CHOLHDL:2,LDLDIRECT:2 in the last 72 hours No results found for this basename: TSH,T4TOTAL,FREET3,T3FREE,THYROIDAB in the last 72 hours No results found for this basename: VITAMINB12:2,FOLATE:2,FERRITIN:2,TIBC:2,IRON:2,RETICCTPCT:2 in the last 72 hours No results found for this basename: LIPASE:2,AMYLASE:2 in the last 72 hours  Urine Studies No results found for this basename: UACOL:2,UAPR:2,USPG:2,UPH:2,UTP:2,UGL:2,UKET:2,UBIL:2,UHGB:2,UNIT:2,UROB:2,ULEU:2,UEPI:2,UWBC:2,URBC:2,UBAC:2,CAST:2,CRYS:2,UCOM:2,BILUA:2 in the last 72 hours  MICROBIOLOGY:  Recent Results (from the past 240 hour(s))  URINE CULTURE     Status: Normal   Collection Time   11/26/11  9:06 PM      Component Value Range Status Comment   Specimen Description URINE, CATHETERIZED   Final    Special Requests NONE   Final    Setup Time [id number]   Final    Colony Count NO GROWTH   Final    Culture NO GROWTH   Final    Report Status 11/27/2011 FINAL   Final   CULTURE, BLOOD (ROUTINE X 2)     Status: Normal (Preliminary result)   Collection Time   11/26/11 10:00 PM      Component Value Range Status Comment   Specimen Description BLOOD LEFT HAND   Final    Special Requests BOTTLES DRAWN AEROBIC ONLY    Final    Setup Time [id number]   Final    Culture     Final    Value:        BLOOD CULTURE RECEIVED NO GROWTH TO DATE CULTURE WILL BE HELD FOR 5 DAYS BEFORE ISSUING A FINAL NEGATIVE REPORT   Report Status PENDING   Incomplete   CULTURE, BLOOD (ROUTINE X 2)     Status: Normal (Preliminary result)   Collection Time   11/26/11 10:21 PM      Component Value Range Status Comment   Specimen Description BLOOD RIGHT HAND   Final    Special Requests BOTTLES DRAWN AEROBIC ONLY 10CC   Final    Setup Time [id number]   Final    Culture     Final    Value:        BLOOD CULTURE RECEIVED NO GROWTH TO DATE CULTURE WILL BE HELD FOR 5 DAYS BEFORE ISSUING A FINAL NEGATIVE REPORT   Report Status PENDING   Incomplete   MRSA PCR SCREENING     Status: Normal   Collection Time   11/27/11  2:53 AM      Component Value Range Status Comment   MRSA by PCR NEGATIVE  NEGATIVE  Final   WOUND CULTURE     Status: Normal   Collection Time   11/27/11  8:56 AM      Component Value Range Status Comment   Specimen Description WOUND RIGHT LOWER LEG   Final    Special Requests Normal   Final    Gram Stain     Final    Value: NO WBC SEEN     RARE SQUAMOUS EPITHELIAL CELLS PRESENT     FEW GRAM POSITIVE COCCI     IN PAIRS   Culture     Final    Value: FEW STAPHYLOCOCCUS AUREUS     Note: RIFAMPIN AND GENTAMICIN SHOULD NOT BE USED AS SINGLE DRUGS FOR TREATMENT OF STAPH INFECTIONS.     FEW GROUP B STREP(S.AGALACTIAE)ISOLATED     Note: TESTING AGAINST S. AGALACTIAE NOT ROUTINELY PERFORMED DUE TO PREDICTABILITY OF AMP/PEN/VAN SUSCEPTIBILITY.   Report Status 12/01/2011 FINAL   Final    Organism ID, Bacteria STAPHYLOCOCCUS AUREUS   Final   CLOSTRIDIUM DIFFICILE BY PCR     Status: Normal   Collection Time   12/01/11  1:22 AM      Component Value Range Status Comment   C difficile by pcr NEGATIVE  NEGATIVE  Final     RADIOLOGY STUDIES/RESULTS: Dg Tibia/fibula Right  11/26/2011  *RADIOLOGY REPORT*  Clinical Data: Cellulitis   RIGHT TIBIA AND FIBULA -  2 VIEW  Comparison: None  Findings: The right tibia and fibula appear intact.  No evidence of acute fracture or subluxation.  No focal bone erosion or sclerosis. No evidence of osteomyelitis.  No focal bone lesion or expansile change.  Bone cortex and trabecular architecture appear intact. Vascular calcifications and calcified phleboliths in the soft tissues.  No radiopaque foreign bodies.  No obvious subcutaneous gas collections.  Mild degenerative changes in the knee.  IMPRESSION: Right tibia and fibula appear intact without evidence of bone erosion or osteomyelitis.  Original Report Authenticated By: Marlon Pel, M.D.   Ct Head Wo Contrast  11/26/2011  *RADIOLOGY REPORT*  Clinical Data:  Altered mental status after fall this morning.  CT HEAD WITHOUT CONTRAST CT CERVICAL SPINE WITHOUT CONTRAST  Technique:  Multidetector CT imaging of the head and cervical spine was performed following the standard protocol without intravenous contrast.  Multiplanar CT image reconstructions of the cervical spine were also generated.  Comparison:  CT head 12/31/2008  CT HEAD  Findings: Diffuse bilateral cerebral and cerebellar atrophy.  Focal encephalomalacia in the right frontal lobe consistent with old infarct and stable since the prior study.  Ventricular dilatation consistent with central atrophy.  No mass effect or midline shift. No abnormal extra-axial fluid collections.  Gray-white matter junctions are distinct.  Basal cisterns are not effaced.  No evidence of acute intracranial hemorrhage.  Vascular calcifications.  No depressed skull fractures.  Mild supraorbital and nasal soft tissue swelling.  Mild irregularity of the nasal bones appear stable suggesting old fractures.  Mild mucosal membrane thickening in the paranasal sinuses.  No acute air-fluid levels are appreciated.  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  The no evidence of  acute intracranial hemorrhage, mass lesion, or acute infarct.  CT CERVICAL SPINE  Findings: Slight retrolisthesis of left lateral mass of C1 with respect to C2, likely degenerative.  Otherwise normal alignment of the cervical vertebrae and facet joints.  Fusion of the C2-3 facet joint on the left which may be congenital or degenerative. Extensive degenerative changes throughout the facet joints bilaterally.  Degenerative changes throughout the cervical spine with narrowed cervical interspaces and associated endplate hypertrophic changes.  Prominent posterior disc osteophyte complexes at C3-4, C5-6, and C6-7 levels.  Degenerative changes and bone erosion involving C1-2.  No significant vertebral compression deformity.  No prevertebral soft tissue swelling.  No significant infiltration into the paraspinal soft tissues.  Vascular calcifications in the carotid and vertebral arteries.  IMPRESSION: Diffuse degenerative change throughout the cervical spine with mild retrolisthesis of the left lateral mass of C1 with respect C2, likely degenerative.  No displaced fractures are identified.  Original Report Authenticated By: Marlon Pel, M.D.   Ct Cervical Spine Wo Contrast  11/26/2011  *RADIOLOGY REPORT*  Clinical Data:  Altered mental status after fall this morning.  CT HEAD WITHOUT CONTRAST CT CERVICAL SPINE WITHOUT CONTRAST  Technique:  Multidetector CT imaging of the head and cervical spine was performed following the standard protocol without intravenous contrast.  Multiplanar CT image reconstructions of the cervical spine were also generated.  Comparison:  CT head 12/31/2008  CT HEAD  Findings: Diffuse bilateral cerebral and cerebellar atrophy.  Focal encephalomalacia in the right frontal lobe consistent with old infarct and stable since the prior study.  Ventricular dilatation consistent with central atrophy.  No mass effect or midline shift. No abnormal extra-axial fluid collections.  Gray-white matter  junctions are distinct.  Basal cisterns  are not effaced.  No evidence of acute intracranial hemorrhage.  Vascular calcifications.  No depressed skull fractures.  Mild supraorbital and nasal soft tissue swelling.  Mild irregularity of the nasal bones appear stable suggesting old fractures.  Mild mucosal membrane thickening in the paranasal sinuses.  No acute air-fluid levels are appreciated.  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  The no evidence of acute intracranial hemorrhage, mass lesion, or acute infarct.  CT CERVICAL SPINE  Findings: Slight retrolisthesis of left lateral mass of C1 with respect to C2, likely degenerative.  Otherwise normal alignment of the cervical vertebrae and facet joints.  Fusion of the C2-3 facet joint on the left which may be congenital or degenerative. Extensive degenerative changes throughout the facet joints bilaterally.  Degenerative changes throughout the cervical spine with narrowed cervical interspaces and associated endplate hypertrophic changes.  Prominent posterior disc osteophyte complexes at C3-4, C5-6, and C6-7 levels.  Degenerative changes and bone erosion involving C1-2.  No significant vertebral compression deformity.  No prevertebral soft tissue swelling.  No significant infiltration into the paraspinal soft tissues.  Vascular calcifications in the carotid and vertebral arteries.  IMPRESSION: Diffuse degenerative change throughout the cervical spine with mild retrolisthesis of the left lateral mass of C1 with respect C2, likely degenerative.  No displaced fractures are identified.  Original Report Authenticated By: Marlon Pel, M.D.   Dg Chest Port 1 View  11/27/2011  *RADIOLOGY REPORT*  Clinical Data: Right IJ line placement  PORTABLE CHEST - 1 VIEW  Comparison: 11/26/2011  Findings: Placement of right central venous catheter with tip over the mid SVC region.  No pneumothorax.  Otherwise stable appearance  of the chest.  Mild cardiac enlargement and pulmonary vascular prominence.  No blunting of costophrenic angles.  Calcification of the aorta.  IMPRESSION: Interval placement of right subclavian venous catheter with tip over the mid SVC region.  No pneumothorax.  Original Report Authenticated By: Marlon Pel, M.D.   Dg Chest Portable 1 View  11/26/2011  *RADIOLOGY REPORT*  Clinical Data: Fever  PORTABLE CHEST - 1 VIEW  Comparison: 03/30/2010  Findings: Slightly shallow inspiration.  Heart size and pulmonary vascularity are mildly increased.  Findings may suggest early congestive change although some of this could be attributed to technique.  No perihilar edema.  No focal airspace consolidation in the lungs.  No blunting of costophrenic angles.  No pneumothorax.  IMPRESSION: Mild cardiac enlargement and pulmonary vascular prominence.  No consolidation or edema.  Original Report Authenticated By: Marlon Pel, M.D.    MEDICATIONS: Scheduled Meds:    . ampicillin-sulbactam (UNASYN) IV  3 g Intravenous Q6H  . aspirin  81 mg Oral Daily  . collagenase   Topical Daily  . digoxin  0.125 mg Oral Daily  . hydrocerin   Topical BID  . metoprolol  50 mg Oral BID  . niacin  1,000 mg Oral QHS  . potassium chloride  40 mEq Oral Once  . simvastatin  20 mg Oral Daily  . vancomycin  1,000 mg Intravenous Q12H  . warfarin  7.5 mg Oral ONCE-1800  . warfarin  7.5 mg Oral ONCE-1800   Continuous Infusions:  PRN Meds:.albuterol, ipratropium, metoprolol, ondansetron (ZOFRAN) IV  Antibiotics: Anti-infectives     Start     Dose/Rate Route Frequency Ordered Stop   11/27/11 2200   vancomycin (VANCOCIN) IVPB 1000 mg/200 mL premix        1,000 mg 200 mL/hr over  60 Minutes Intravenous Every 12 hours 11/27/11 1343     11/27/11 1000   vancomycin (VANCOCIN) 1,250 mg in sodium chloride 0.9 % 250 mL IVPB  Status:  Discontinued        1,250 mg 166.7 mL/hr over 90 Minutes Intravenous Every 12 hours 11/27/11  0338 11/27/11 1343   11/27/11 0400   piperacillin-tazobactam (ZOSYN) IVPB 3.375 g  Status:  Discontinued        3.375 g 12.5 mL/hr over 240 Minutes Intravenous 3 times per day 11/27/11 0338 11/27/11 0344   11/27/11 0400   Ampicillin-Sulbactam (UNASYN) 3 g in sodium chloride 0.9 % 100 mL IVPB        3 g 100 mL/hr over 60 Minutes Intravenous Every 6 hours 11/27/11 0345     11/26/11 2345   vancomycin (VANCOCIN) IVPB 1000 mg/200 mL premix        1,000 mg 200 mL/hr over 60 Minutes Intravenous  Once 11/26/11 2336 11/27/11 0045   11/26/11 2230   clindamycin (CLEOCIN) IVPB 600 mg        600 mg 100 mL/hr over 30 Minutes Intravenous  Once 11/26/11 2223 11/26/11 2336          Assessment/Plan: Patient Active Hospital Problem List: Sepsis    Assessment: Resolved, this is secondary to cellulitis.    Plan: Continue to monitor and we'll continue antibiotics-Vanco and Unasyn (day 5).   Cellulitis and abscess of leg, except foot    Assessment: This was probably causing sepsis. Currently is afebrile with decreasing leukocytosis.    Plan: Continue with empiric vancomycin and Unasyn. Will have wound care take a look at the patient as well.  HYPERTENSION    Assessment: Moderately well controlled.    Plan: Resume metoprolol as blood pressure now stable   ATRIAL FIBRILLATION    Assessment: Rate controlled.    Plan: Resume metoprolol, continue digoxin. Coumadin being dosed by pharmacy. INR now 1.99.   Encephalopathy, toxic    Assessment: This is toxic metabolite encephalopathy from sepsis. This is now resolved.    Plan: Continue antibiotics and we'll continue to monitor.   Hypotension    Assessment: From sepsis. This is now resolved.    Plan: Continue to monitor, no further episodes of hypotension while on Lopressor.   Anemia    Assessment: Likely secondary to chronic inflammation from leg wounds.    Plan: H&H remains stable, will monitor periodically.   Thrombocytopenia    Assessment:  This is a mild, review of prior old laboratory records does show thrombocytopenia as well. For now we will just monitor.    Plan: Monitor CBC periodically.    dyslipidemia   Assessment: No indication to check LDL this admission, we'll defer further monitoring to outpatient.   Plan: Continue with statin   Disposition: Remain inpatient- spoke with daughter, option of SNF was discussed vs d/c home with 24/7 care, daughter and family is leaning to take patient home with supervision for atleast 18 hours a day. She very well knows the risks of catastrophic falls, but is willing to take the risks.  DVT Prophylaxis: Not needed as patient on Coumadin  Code Status: Full code  Maretta Bees,  MD. 12/01/2011, 4:36 PM

## 2011-12-01 NOTE — Progress Notes (Signed)
ANTICOAGULATION CONSULT NOTE - Follow Up Consult  Pharmacy Consult for Coumadin Indication: atrial fibrillation  No Known Allergies  Patient Measurements: Height: 6' (182.9 cm) Weight: 218 lb 7.6 oz (99.1 kg) IBW/kg (Calculated) : 77.6   Vital Signs: Temp: 98.2 F (36.8 C) (01/07 0500) BP: 158/78 mmHg (01/07 0500) Pulse Rate: 75  (01/07 0500)  Labs:  Basename 12/01/11 0520 11/30/11 0500 11/29/11 0605 11/28/11 0950  HGB -- 10.2* 10.2* --  HCT -- 29.9* 29.7* 31.6*  PLT -- 127* 132* 125*  APTT -- -- -- --  LABPROT 22.9* 19.8* 21.7* --  INR 1.99* 1.65* 1.85* --  HEPARINUNFRC -- -- -- --  CREATININE 0.62 0.62 0.67 --  CKTOTAL -- -- -- --  CKMB -- -- -- --  TROPONINI -- -- -- --   Estimated Creatinine Clearance: 83.8 ml/min (by C-G formula based on Cr of 0.62).   Assessment: [name] on coumadin for afib (PTA dose: 2.5mg  MF and 5mg  all other days of the week). INR close to therapeutic range and trending up.   Goal of Therapy:  INR 2-3   Plan:  Coumadin 7.5mg  po x 1 F/u INR in am  Riki Rusk 12/01/2011,8:56 AM

## 2011-12-01 NOTE — Consult Note (Signed)
WOC follow up note:   New consult placed for WOC eval for topical wound care, note that pt was seen and recommendations made on 11/27/11. Unless significant change in status would continue current wound care orders.  Re consult if needed, will not follow at this time. Thanks  Anadia Helmes [employer], CWOCN [phone])

## 2011-12-02 ENCOUNTER — Encounter: Payer: Medicare Other | Admitting: *Deleted

## 2011-12-02 LAB — PROTIME-INR
INR: 2.14 — ABNORMAL HIGH (ref 0.00–1.49)
Prothrombin Time: 24.3 seconds — ABNORMAL HIGH (ref 11.6–15.2)

## 2011-12-02 MED ORDER — WARFARIN SODIUM 5 MG PO TABS
5.0000 mg | ORAL_TABLET | Freq: Once | ORAL | Status: AC
  Administered 2011-12-02: 5 mg via ORAL
  Filled 2011-12-02: qty 1

## 2011-12-02 MED ORDER — METOPROLOL SUCCINATE ER 50 MG PO TB24
50.0000 mg | ORAL_TABLET | Freq: Two times a day (BID) | ORAL | Status: DC
  Administered 2011-12-02 – 2011-12-03 (×2): 50 mg via ORAL
  Filled 2011-12-02 (×3): qty 1

## 2011-12-02 NOTE — Progress Notes (Signed)
ANTICOAGULATION CONSULT NOTE - Follow Up Consult  Pharmacy Consult for Coumadin  Indication: atrial fibrillation  No Known Allergies  Patient Measurements: Height: 6' (182.9 cm) Weight: 218 lb 7.6 oz (99.1 kg) IBW/kg (Calculated) : 77.6   Vital Signs: Temp: 98.8 F (37.1 C) (01/08 0500) BP: 129/72 mmHg (01/08 0500) Pulse Rate: 109  (01/08 0500)  Labs:  [name] [name] 12/02/11 1610 12/01/11 0520 11/30/11 0500  HGB -- -- 10.2*  HCT -- -- 29.9*  PLT -- -- 127*  APTT -- -- --  LABPROT 24.3* 22.9* 19.8*  INR 2.14* 1.99* 1.65*  HEPARINUNFRC -- -- --  CREATININE -- 0.62 0.62  CKTOTAL -- -- --  CKMB -- -- --  TROPONINI -- -- --   Estimated Creatinine Clearance: 83.8 ml/min (by C-G formula based on Cr of 0.62).   Medications:  Scheduled:    . ampicillin-sulbactam (UNASYN) IV  3 g Intravenous Q6H  . aspirin  81 mg Oral Daily  . collagenase   Topical Daily  . digoxin  0.125 mg Oral Daily  . hydrocerin   Topical BID  . metoprolol  50 mg Oral BID  . niacin  1,000 mg Oral QHS  . simvastatin  20 mg Oral Daily  . vancomycin  1,000 mg Intravenous Q12H  . warfarin  7.5 mg Oral ONCE-1800   Assessment: [name] on coumadin for afib (PTA dose: 2.5mg  MF and 5mg  all other days of the week). INR therapeutic range and trending up.   Goal of Therapy:  INR 2-3   Plan:  Coumadin 5mg  po x 1 F/u INR  [name] [name] 12/02/2011,10:05 AM

## 2011-12-02 NOTE — Progress Notes (Signed)
PATIENT DETAILS Name: [name] [name] Age: 78 y.o. Sex: male Date of Birth: Oct 11, 1925 Admit Date: 11/26/2011 ZOX:WRUE,AVWUJWJ ALLEN, MD  Interval History: Patient is a 78 year old white male with a past medical history of atrial fibrillation, coronary artery disease and hypertension who presented to the hospital on 11/27/2011 altered mental status and hypotension. He was found to have a right lower extremity wound (likely culprit). On further evaluation he was also found to have a fever of 104F. He was also found to be profoundly hypotensive in spite of 5 L of intravenous fluid. Subsequently PCCM was consulted, and patient was then admitted to the ICU and did require pressors for stabilization. He was empirically started on vancomycin and Unasyn. Upon stabilization and improvement patient was then subsequently transferred to the telemetry unit. Since his transfer, patient has been stable. He is also on chronic Coumadin therapy for his atrial fibrillation. He has also been restarted on his Lopressor with adequate blood pressure control. I have had numerous conversations with the patient's daughter Hillary Bow [phone], unfortunately this patient is the primary caregiver of his wife who has advanced dementia. This patient now has significant deconditioning, physical therapy is recommending SNF VS. home with 24-hour supervision. Currently after a lot of discussion, plans are to discharge patient home, family is currently arranging 24-hour help. It is expected that by tomorrow they will be able to arrange the necessary help for the patient to be discharged home. In the meantime patient continues to be on vancomycin and Unasyn, current plans to transition to oral antibiotics start   Subjective: Better. Some rhonchi today- but not in any distress.   Objective: Vital signs in last 24 hours: Filed Vitals:   12/01/11 2200 12/02/11 0500 12/02/11 1000 12/02/11 1021  BP: 136/71 129/72 96/55   Pulse: 84  109 81 81  Temp: 98.8 F (37.1 C) 98.8 F (37.1 C)    TempSrc:      Resp: 20 18    Height:      Weight:      SpO2: 97% 96%      Weight change:   Body mass index is 29.63 kg/(m^2).  Intake/Output from previous day:  Intake/Output Summary (Last 24 hours) at 12/02/11 1426 Last data filed at 12/02/11 1200  Gross per 24 hour  Intake   1240 ml  Output   2725 ml  Net  -1485 ml    PHYSICAL EXAM: Gen Exam: Awake and alert with clear speech.   Neck: Supple, No JVD.   Chest: B/L Clear.  Few scattered rhonchi. CVS: S1 S2 Regular, no murmurs.  Abdomen: soft, BS +, non tender, non distended. Extremities: Chronic venous stasis changes in his lateral lower extremities, significantly reduced erythema when compared to the marked area, right leg ulcers seen. Neurologic: Non Focal.   Skin: No Rash.   Wounds: N/A.    CONSULTS:  none  LAB RESULTS: CBC  Lab 11/30/11 0500 11/29/11 0605 11/28/11 0950 11/27/11 0350 11/26/11 2207  WBC 14.3* 20.0* 23.7* 30.1* 12.0*  HGB 10.2* 10.2* 10.7* 10.7* 11.0*  HCT 29.9* 29.7* 31.6* 31.6* 32.1*  PLT 127* 132* 125* 142* 131*  MCV 95.8 95.8 97.2 97.2 97.9  MCH 32.7 32.9 32.9 32.9 33.5  MCHC 34.1 34.3 33.9 33.9 34.3  RDW 15.7* 15.7* 15.9* 15.4 15.2  LYMPHSABS -- -- -- 0.6* 0.3*  MONOABS -- -- -- 1.8* 0.3  EOSABS -- -- -- 0.0 0.0  BASOSABS -- -- -- 0.0 0.0  BANDABS -- -- -- -- --  Chemistries   Lab 12/01/11 0520 11/30/11 0500 11/29/11 0605 11/28/11 0950 11/27/11 0350  NA 129* 133* 135 136 136  K 3.8 3.4* 3.8 3.2* 3.1*  CL 98 101 105 105 105  CO2 25 25 22 23 20   GLUCOSE 88 83 97 100* 117*  BUN 6 6 8 10 11   CREATININE 0.62 0.62 0.67 0.68 0.85  CALCIUM 7.8* 7.9* 7.8* 7.8* 7.6*  MG -- -- -- 1.5 --    GFR Estimated Creatinine Clearance: 83.8 ml/min (by C-G formula based on Cr of 0.62).  Coagulation profile  Lab 12/02/11 0633 12/01/11 0520 11/30/11 0500 11/29/11 0605 11/28/11 0950  INR 2.14* 1.99* 1.65* 1.85* 2.19*  PROTIME -- -- --  -- --    Cardiac Enzymes  Lab 11/26/11 2222  CKMB --  TROPONINI <0.30  MYOGLOBIN --    No components found with this basename: POCBNP:3 No results found for this basename: DDIMER:2 in the last 72 hours No results found for this basename: HGBA1C:2 in the last 72 hours No results found for this basename: CHOL:2,HDL:2,LDLCALC:2,TRIG:2,CHOLHDL:2,LDLDIRECT:2 in the last 72 hours No results found for this basename: TSH,T4TOTAL,FREET3,T3FREE,THYROIDAB in the last 72 hours No results found for this basename: VITAMINB12:2,FOLATE:2,FERRITIN:2,TIBC:2,IRON:2,RETICCTPCT:2 in the last 72 hours No results found for this basename: LIPASE:2,AMYLASE:2 in the last 72 hours  Urine Studies No results found for this basename: UACOL:2,UAPR:2,USPG:2,UPH:2,UTP:2,UGL:2,UKET:2,UBIL:2,UHGB:2,UNIT:2,UROB:2,ULEU:2,UEPI:2,UWBC:2,URBC:2,UBAC:2,CAST:2,CRYS:2,UCOM:2,BILUA:2 in the last 72 hours  MICROBIOLOGY: Recent Results (from the past 240 hour(s))  URINE CULTURE     Status: Normal   Collection Time   11/26/11  9:06 PM      Component Value Range Status Comment   Specimen Description URINE, CATHETERIZED   Final    Special Requests NONE   Final    Setup Time [id number]   Final    Colony Count NO GROWTH   Final    Culture NO GROWTH   Final    Report Status 11/27/2011 FINAL   Final   CULTURE, BLOOD (ROUTINE X 2)     Status: Normal (Preliminary result)   Collection Time   11/26/11 10:00 PM      Component Value Range Status Comment   Specimen Description BLOOD LEFT HAND   Final    Special Requests BOTTLES DRAWN AEROBIC ONLY   Final    Setup Time [id number]   Final    Culture     Final    Value:        BLOOD CULTURE RECEIVED NO GROWTH TO DATE CULTURE WILL BE HELD FOR 5 DAYS BEFORE ISSUING A FINAL NEGATIVE REPORT   Report Status PENDING   Incomplete   CULTURE, BLOOD (ROUTINE X 2)     Status: Normal (Preliminary result)   Collection Time   11/26/11 10:21 PM      Component Value Range Status Comment    Specimen Description BLOOD RIGHT HAND   Final    Special Requests BOTTLES DRAWN AEROBIC ONLY 10CC   Final    Setup Time [id number]   Final    Culture     Final    Value:        BLOOD CULTURE RECEIVED NO GROWTH TO DATE CULTURE WILL BE HELD FOR 5 DAYS BEFORE ISSUING A FINAL NEGATIVE REPORT   Report Status PENDING   Incomplete   MRSA PCR SCREENING     Status: Normal   Collection Time   11/27/11  2:53 AM      Component Value Range Status Comment   MRSA by PCR NEGATIVE  NEGATIVE  Final   WOUND CULTURE     Status: Normal   Collection Time   11/27/11  8:56 AM      Component Value Range Status Comment   Specimen Description WOUND RIGHT LOWER LEG   Final    Special Requests Normal   Final    Gram Stain     Final    Value: NO WBC SEEN     RARE SQUAMOUS EPITHELIAL CELLS PRESENT     FEW GRAM POSITIVE COCCI     IN PAIRS   Culture     Final    Value: FEW STAPHYLOCOCCUS AUREUS     Note: RIFAMPIN AND GENTAMICIN SHOULD NOT BE USED AS SINGLE DRUGS FOR TREATMENT OF STAPH INFECTIONS.     FEW GROUP B STREP(S.AGALACTIAE)ISOLATED     Note: TESTING AGAINST S. AGALACTIAE NOT ROUTINELY PERFORMED DUE TO PREDICTABILITY OF AMP/PEN/VAN SUSCEPTIBILITY.   Report Status 12/01/2011 FINAL   Final    Organism ID, Bacteria STAPHYLOCOCCUS AUREUS   Final   CLOSTRIDIUM DIFFICILE BY PCR     Status: Normal   Collection Time   12/01/11  1:22 AM      Component Value Range Status Comment   C difficile by pcr NEGATIVE  NEGATIVE  Final     RADIOLOGY STUDIES/RESULTS: Dg Tibia/fibula Right  11/26/2011  *RADIOLOGY REPORT*  Clinical Data: Cellulitis  RIGHT TIBIA AND FIBULA - 2 VIEW  Comparison: None  Findings: The right tibia and fibula appear intact.  No evidence of acute fracture or subluxation.  No focal bone erosion or sclerosis. No evidence of osteomyelitis.  No focal bone lesion or expansile change.  Bone cortex and trabecular architecture appear intact. Vascular calcifications and calcified phleboliths in the soft tissues.  No  radiopaque foreign bodies.  No obvious subcutaneous gas collections.  Mild degenerative changes in the knee.  IMPRESSION: Right tibia and fibula appear intact without evidence of bone erosion or osteomyelitis.  Original Report Authenticated By: Marlon Pel, M.D.   Ct Head Wo Contrast  11/26/2011  *RADIOLOGY REPORT*  Clinical Data:  Altered mental status after fall this morning.  CT HEAD WITHOUT CONTRAST CT CERVICAL SPINE WITHOUT CONTRAST  Technique:  Multidetector CT imaging of the head and cervical spine was performed following the standard protocol without intravenous contrast.  Multiplanar CT image reconstructions of the cervical spine were also generated.  Comparison:  CT head 12/31/2008  CT HEAD  Findings: Diffuse bilateral cerebral and cerebellar atrophy.  Focal encephalomalacia in the right frontal lobe consistent with old infarct and stable since the prior study.  Ventricular dilatation consistent with central atrophy.  No mass effect or midline shift. No abnormal extra-axial fluid collections.  Gray-white matter junctions are distinct.  Basal cisterns are not effaced.  No evidence of acute intracranial hemorrhage.  Vascular calcifications.  No depressed skull fractures.  Mild supraorbital and nasal soft tissue swelling.  Mild irregularity of the nasal bones appear stable suggesting old fractures.  Mild mucosal membrane thickening in the paranasal sinuses.  No acute air-fluid levels are appreciated.  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  The no evidence of acute intracranial hemorrhage, mass lesion, or acute infarct.  CT CERVICAL SPINE  Findings: Slight retrolisthesis of left lateral mass of C1 with respect to C2, likely degenerative.  Otherwise normal alignment of the cervical vertebrae and facet joints.  Fusion of the C2-3 facet joint on the left which may be congenital or degenerative. Extensive degenerative  changes throughout the facet  joints bilaterally.  Degenerative changes throughout the cervical spine with narrowed cervical interspaces and associated endplate hypertrophic changes.  Prominent posterior disc osteophyte complexes at C3-4, C5-6, and C6-7 levels.  Degenerative changes and bone erosion involving C1-2.  No significant vertebral compression deformity.  No prevertebral soft tissue swelling.  No significant infiltration into the paraspinal soft tissues.  Vascular calcifications in the carotid and vertebral arteries.  IMPRESSION: Diffuse degenerative change throughout the cervical spine with mild retrolisthesis of the left lateral mass of C1 with respect C2, likely degenerative.  No displaced fractures are identified.  Original Report Authenticated By: Marlon Pel, M.D.   Ct Cervical Spine Wo Contrast  11/26/2011  *RADIOLOGY REPORT*  Clinical Data:  Altered mental status after fall this morning.  CT HEAD WITHOUT CONTRAST CT CERVICAL SPINE WITHOUT CONTRAST  Technique:  Multidetector CT imaging of the head and cervical spine was performed following the standard protocol without intravenous contrast.  Multiplanar CT image reconstructions of the cervical spine were also generated.  Comparison:  CT head 12/31/2008  CT HEAD  Findings: Diffuse bilateral cerebral and cerebellar atrophy.  Focal encephalomalacia in the right frontal lobe consistent with old infarct and stable since the prior study.  Ventricular dilatation consistent with central atrophy.  No mass effect or midline shift. No abnormal extra-axial fluid collections.  Gray-white matter junctions are distinct.  Basal cisterns are not effaced.  No evidence of acute intracranial hemorrhage.  Vascular calcifications.  No depressed skull fractures.  Mild supraorbital and nasal soft tissue swelling.  Mild irregularity of the nasal bones appear stable suggesting old fractures.  Mild mucosal membrane thickening in the paranasal sinuses.  No acute air-fluid levels are appreciated.   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  The no evidence of acute intracranial hemorrhage, mass lesion, or acute infarct.  CT CERVICAL SPINE  Findings: Slight retrolisthesis of left lateral mass of C1 with respect to C2, likely degenerative.  Otherwise normal alignment of the cervical vertebrae and facet joints.  Fusion of the C2-3 facet joint on the left which may be congenital or degenerative. Extensive degenerative changes throughout the facet joints bilaterally.  Degenerative changes throughout the cervical spine with narrowed cervical interspaces and associated endplate hypertrophic changes.  Prominent posterior disc osteophyte complexes at C3-4, C5-6, and C6-7 levels.  Degenerative changes and bone erosion involving C1-2.  No significant vertebral compression deformity.  No prevertebral soft tissue swelling.  No significant infiltration into the paraspinal soft tissues.  Vascular calcifications in the carotid and vertebral arteries.  IMPRESSION: Diffuse degenerative change throughout the cervical spine with mild retrolisthesis of the left lateral mass of C1 with respect C2, likely degenerative.  No displaced fractures are identified.  Original Report Authenticated By: Marlon Pel, M.D.   Dg Chest Port 1 View  11/27/2011  *RADIOLOGY REPORT*  Clinical Data: Right IJ line placement  PORTABLE CHEST - 1 VIEW  Comparison: 11/26/2011  Findings: Placement of right central venous catheter with tip over the mid SVC region.  No pneumothorax.  Otherwise stable appearance of the chest.  Mild cardiac enlargement and pulmonary vascular prominence.  No blunting of costophrenic angles.  Calcification of the aorta.  IMPRESSION: Interval placement of right subclavian venous catheter with tip over the mid SVC region.  No pneumothorax.  Original Report Authenticated By: Marlon Pel, M.D.   Dg Chest Portable 1 View  11/26/2011  *RADIOLOGY REPORT*  Clinical  Data: Fever  PORTABLE CHEST - 1 VIEW  Comparison: 03/30/2010  Findings: Slightly shallow inspiration.  Heart size and pulmonary vascularity are mildly increased.  Findings may suggest early congestive change although some of this could be attributed to technique.  No perihilar edema.  No focal airspace consolidation in the lungs.  No blunting of costophrenic angles.  No pneumothorax.  IMPRESSION: Mild cardiac enlargement and pulmonary vascular prominence.  No consolidation or edema.  Original Report Authenticated By: Marlon Pel, M.D.    MEDICATIONS: Scheduled Meds:    . ampicillin-sulbactam (UNASYN) IV  3 g Intravenous Q6H  . aspirin  81 mg Oral Daily  . collagenase   Topical Daily  . digoxin  0.125 mg Oral Daily  . hydrocerin   Topical BID  . metoprolol  50 mg Oral BID  . niacin  1,000 mg Oral QHS  . simvastatin  20 mg Oral Daily  . vancomycin  1,000 mg Intravenous Q12H  . warfarin  5 mg Oral ONCE-1800  . warfarin  7.5 mg Oral ONCE-1800   Continuous Infusions:  PRN Meds:.albuterol, ipratropium, metoprolol, ondansetron (ZOFRAN) IV  Antibiotics: Anti-infectives     Start     Dose/Rate Route Frequency Ordered Stop   11/27/11 2200   vancomycin (VANCOCIN) IVPB 1000 mg/200 mL premix        1,000 mg 200 mL/hr over 60 Minutes Intravenous Every 12 hours 11/27/11 1343     11/27/11 1000   vancomycin (VANCOCIN) 1,250 mg in sodium chloride 0.9 % 250 mL IVPB  Status:  Discontinued        1,250 mg 166.7 mL/hr over 90 Minutes Intravenous Every 12 hours 11/27/11 0338 11/27/11 1343   11/27/11 0400   piperacillin-tazobactam (ZOSYN) IVPB 3.375 g  Status:  Discontinued        3.375 g 12.5 mL/hr over 240 Minutes Intravenous 3 times per day 11/27/11 0338 11/27/11 0344   11/27/11 0400   Ampicillin-Sulbactam (UNASYN) 3 g in sodium chloride 0.9 % 100 mL IVPB        3 g 100 mL/hr over 60 Minutes Intravenous Every 6 hours 11/27/11 0345     11/26/11 2345   vancomycin (VANCOCIN) IVPB 1000 mg/200  mL premix        1,000 mg 200 mL/hr over 60 Minutes Intravenous  Once 11/26/11 2336 11/27/11 0045   11/26/11 2230   clindamycin (CLEOCIN) IVPB 600 mg        600 mg 100 mL/hr over 30 Minutes Intravenous  Once 11/26/11 2223 11/26/11 2336          Assessment/Plan: Patient Active Hospital Problem List: Sepsis with hypotension.   Assessment: Resolved, this is secondary to cellulitis.    Plan: Continue to monitor and we'll continue antibiotics-Vanco and Unasyn (day 6).   Cellulitis and abscess of leg, except foot    Assessment: This was probably causing sepsis. Currently is afebrile with decreasing leukocytosis.    Plan: Continue with empiric vancomycin and Unasyn. Will transition to oral antibiotics on discharge-will need treatment for atleast 2 weeks duration.Wound culture MSSA-not sure how accurate as just a swab.  HYPERTENSION    Assessment: Moderately well controlled.    Plan: Continue metoprolol as blood pressure now stable   ATRIAL FIBRILLATION    Assessment: Rate controlled.    Plan: Resume metoprolol, continue digoxin. Coumadin being dosed by pharmacy. INR now up to 2.14   Encephalopathy, toxic    Assessment: This is toxic metabolite encephalopathy from sepsis. This is now resolved.  Plan: Continue antibiotics and we'll continue to monitor.   Hypotension    Assessment: From sepsis. This is now resolved.    Plan: Continue to monitor, no further episodes of hypotension while on Lopressor-although BP slightly soft today-will add parameters.  Anemia    Assessment: Likely secondary to chronic inflammation from leg wounds.    Plan: H&H remains stable, will monitor periodically.   Thrombocytopenia    Assessment: This is a mild, review of prior old laboratory records does show thrombocytopenia as well. For now we will just monitor.    Plan: Monitor CBC periodically.    dyslipidemia   Assessment: No indication to check LDL this admission, we'll defer further monitoring to  outpatient.   Plan: Continue with statin   Disposition: Home with 24 hour supervision, likely in 1-2 days, once family is able to make all arrangements-including a private sitter  DVT Prophylaxis: Not needed as patient on Coumadin  Code Status: Full code  Maretta Bees,  MD. 12/02/2011, 2:26 PM

## 2011-12-02 NOTE — Progress Notes (Signed)
Clinical Social Worker met with pt, pt daughter, and pt wife at bedside with CM. Pt and pt daughter expressed that pt does not want to go to SNF because he assists with caring for pt wife and does not want to live separately from pt wife at this time. Pt daughter reports that she has arranged for a private duty care agency to come to home to provide 24 hour care and agreeable to Home Health services. No further social work needs at this time. CSW signing off.  Jacklynn Lewis, MSW, LCSWA  Clinical Social Work [phone]

## 2011-12-02 NOTE — Progress Notes (Signed)
Physical Therapy Treatment Patient Details Name: SLATON REASER MRN: [id number] DOB: 17-Jan-1925 Today's Date: 12/02/2011  PT Assessment/Plan  PT - Assessment/Plan Comments on Treatment Session: Pt tolerated treatment well. His stride length is still decreased and since he is reaching for furniture and walls around him it might be beneficial for him to use a rolling walker at home when discharged. Pt is improving in strength and endurance.  PT Frequency: Min 3X/week Follow Up Recommendations: Home health PT Equipment Recommended:  (Wife has rolling walker to try ) PT Goals  Acute Rehab PT Goals PT Goal: Sit to Supine/Side - Progress: Progressing toward goal PT Goal: Sit to Stand - Progress: Progressing toward goal PT Goal: Stand to Sit - Progress: Progressing toward goal PT Goal: Ambulate - Progress: Progressing toward goal  PT Treatment Precautions/Restrictions  Precautions Precautions: Fall Required Braces or Orthoses: No Restrictions Weight Bearing Restrictions: No Mobility (including Balance) Bed Mobility Bed Mobility: Yes Supine to Sit: 5: Supervision;With rails;HOB flat Supine to Sit Details (indicate cue type and reason): no need for cueing for proper technique  Transfers Sit to Stand: 4: Min assist;From bed Sit to Stand Details (indicate cue type and reason): needs time to gain balance when first stands up  Stand to Sit: 4: Min assist;To bed;To chair/3-in-1 Stand to Sit Details: needs assist to line up in front of chair  Ambulation/Gait Ambulation/Gait Assistance: 4: Min assist Ambulation/Gait Assistance Details (indicate cue type and reason): when first started walking was grabbing onto things around himself but after cueing on not touching things he was able to ambulate with little assistance from the PT and no assistive device  Ambulation Distance (Feet): 75 Feet Assistive device: 1 person hand held assist Gait Pattern: Decreased stride length;Decreased hip/knee  flexion - right;Decreased hip/knee flexion - left Gait velocity: slowed  Stairs: No Wheelchair Mobility Wheelchair Mobility: No  Posture/Postural Control Posture/Postural Control: No significant limitations Balance Balance Assessed: No Static Standing Balance Static Standing - Level of Assistance: Not tested (comment) Exercise  General Exercises - Lower Extremity Ankle Circles/Pumps: AROM;Strengthening;Both;10 reps Long Arc Quad: AROM;Strengthening;Both;10 reps Hip Flexion/Marching: AROM;Strengthening;Both;10 reps End of Session PT - End of Session Equipment Utilized During Treatment: Gait belt Activity Tolerance: Patient tolerated treatment well Patient left: in chair;with call bell in reach;with family/visitor present Nurse Communication: Mobility status for ambulation General Behavior During Session: [facility] for tasks performed Cognition: [facility] for tasks performed  Elvera Bicker 12/02/2011, 4:10 PM  [facility] [phone] (pager)

## 2011-12-03 LAB — VANCOMYCIN, TROUGH: Vancomycin Tr: 14.5 ug/mL (ref 10.0–20.0)

## 2011-12-03 MED ORDER — HYDROCERIN EX CREA: 1.0000 "application " | TOPICAL_CREAM | Freq: Two times a day (BID) | CUTANEOUS | Status: DC

## 2011-12-03 MED ORDER — WARFARIN SODIUM 5 MG PO TABS
5.0000 mg | ORAL_TABLET | Freq: Once | ORAL | Status: DC
  Filled 2011-12-03: qty 1

## 2011-12-03 MED ORDER — LEVOFLOXACIN 750 MG PO TABS: 750.0000 mg | ORAL_TABLET | Freq: Every day | ORAL | Status: DC

## 2011-12-03 MED ORDER — COLLAGENASE 250 UNIT/GM EX OINT: TOPICAL_OINTMENT | Freq: Every day | CUTANEOUS | Status: AC

## 2011-12-03 NOTE — Discharge Summary (Signed)
Physician Discharge Summary  [name] [name] ZOX:[id number] DOB: [birth year] DOA: 11/26/2011  PCP: Evette Georges, MD  Admit date: 11/26/2011 Discharge date: 12/03/2011  Discharge Diagnoses:  1. Sepsis secondary to right lower extremity cellulitis 2. Right lower extremity cellulitis 3. Acute delirium, resolved 4. Atrial fibrillation with rapid ventricular response, resolved 5. Mild hyponatremia, expected to resolve spontaneously.  Discharge Condition: Improved  Disposition: Home with home health  History of present illness:  77 year old man presented emergency room with fever, atrial fibrillation with rapid ventricular response, altered metal status and a fall. He was admitted by the critical care service for sepsis secondary to cellulitis, atrial fibrillation with rapid ventricular response.  Hospital Course:  Mr. Brodrick was admitted to the ICU. He required high-volume fluid resuscitation and vasopressor support. He is to with early goal-directed therapy by the critical care service. His condition quickly improved he was transferred to medical floor under the care of the hospitalist service. He is completed 7 days of IV antibiotics. Culture data is notable only for a few staph aureus and group B strep from wound culture. He feels quite well and like to go home today. He will complete an additional week of oral antibiotics in the outpatient setting. He had brief atrial fibrillation with rapid ventricular response which resolved with treatment of his infection. These and other issues as below. 1. Sepsis secondary to right lower extremity cellulitis: Resolved. Required high-volume fluid resuscitation and vasopressors.  2. Right lower extremity cellulitis: Blood cultures no growth. Wound culture with group B strep and MSSA sensitive to Levaquin. Completed 7 days of IV antibiotics. Additional 7 days of Levaquin.  3. Atrial fibrillation with rapid ventricular response: Ventricular rate control. INR  therapeutic. Continue warfarin on discharge.  4. Acute delirium secondary to #1: Resolved.  5. Hyponatremia: Needs close outpatient followup.  6. Thrombocytopenia: Mild. Stable. Followup as an outpatient.  7. Anemia: Stable. Followup as an outpatient.  Home with home health modalities as below: HH-1 RN   HH-2 PT   HH-3 OT   HH-4 NURSE'S AIDE   HH-6 SOCIAL WORKER   Consultants:  Initially admitted by critical care and then transferred to the hospitalist service   Physical therapy: Home health PT. Rolling walker with 5 inch wheels.  Procedures:  January 3-4: Central venous catheter insertion  Discharge Instructions  Discharge Orders    Future Appointments: Provider: Department: Dept Phone: Center:   05/11/2012 2:00 PM [facility] [place] [phone] [facility]   05/11/2012 2:30 PM Pryor Ochoa, MD [facility] [facility] [phone] [facility]     Future Orders Please Complete By Expires   Diet - low sodium heart healthy      Increase activity slowly      Discharge wound care:      Comments:   Apply Santyl ointment to bilateral leg lesions daily. Keep legs are wrapped daily.   Call MD for:  redness, tenderness, or signs of infection (pain, swelling, redness, odor or green/yellow discharge around incision site)        Current Discharge Medication List    START taking these medications   Details  collagenase (SANTYL) ointment Apply topically daily. Apply topically daily. Apply to necrotic tissue of bil. LE wounds, cover with dry dressing, change daily. Once non viable tissue removed with debridement agent ok to switch to covering with silicone foam dressing only. Qty: 15 g, Refills: 0    hydrocerin (EUCERIN) CREA Apply 1 application topically 2 (two) times daily. Apply topically 2 (two) times daily. Apply  to dry skin of bil. LE and feet, but not between toes Qty: 113 g, Refills: 0    levofloxacin (LEVAQUIN) 750 MG tablet Take 1 tablet (750 mg total) by mouth daily. Start 1/10  AM. Rob Bunting: 7 tablet, Refills: 0      CONTINUE these medications which have NOT CHANGED   Details  aspirin 81 MG tablet Take 81 mg by mouth daily.      atorvastatin (LIPITOR) 10 MG tablet Take 1 tablet (10 mg total) by mouth daily. Qty: 90 tablet, Refills: 3    Calcium-Vitamin D (CALTRATE 600 PLUS-VIT D PO) Take by mouth 1 day or 1 dose.     digoxin (LANOXIN) 0.125 MG tablet Take 1 tablet (125 mcg total) by mouth daily. Qty: 90 tablet, Refills: 3    methotrexate (RHEUMATREX) 2.5 MG tablet Take 2.5 mg by mouth 2 (two) times a week. Take 5 tabs Saturday and Sunday    metoprolol (TOPROL-XL) 50 MG 24 hr tablet Take 1 tablet (50 mg total) by mouth 2 (two) times daily. Qty: 180 tablet, Refills: 3    Multiple Vitamins-Minerals (CENTRUM SILVER PO) Take by mouth.      niacin (NIASPAN) 1000 MG CR tablet Take 1 tablet (1,000 mg total) by mouth at bedtime. 2 tabs at bedtime Qty: 180 tablet, Refills: 2    nitroGLYCERIN (NITROSTAT) 0.4 MG SL tablet Place 0.4 mg under the tongue every 5 (five) minutes as needed. For chest pain     traMADol (ULTRAM) 50 MG tablet Take 50 mg by mouth. Take one tab 2-3 times a day as needed     warfarin (COUMADIN) 5 MG tablet Take as directed by Anticoagulation clinic  (Patient takes up to 1 tablet daily) Qty: 90 tablet, Refills: 3      STOP taking these medications     HYDROcodone-homatropine (HYCODAN) 5-1.5 MG/5ML syrup        Follow-up Information    Follow up with TODD,JEFFREY ALLEN in 2 days. (for PT/INR)           The results of significant diagnostics from this hospitalization (including imaging, microbiology, ancillary and laboratory) are listed below for reference.    Significant Diagnostic Studies: Dg Chest 2 View  12/02/2011  *RADIOLOGY REPORT*  Clinical Data: Wheezing.  Weakness.  Atrial fibrillation.  CHEST - 2 VIEW  Comparison: 11/27/2011.  Findings: Cardiopericardial silhouette is within normal limits. Basilar atelectasis is present.   Small bilateral posteriorly layering pleural effusions.  No airspace disease.  No consolidation.  Aortic arch atherosclerosis.  Mild right apical pleural scarring.  Interval removal of right subclavian central line.  IMPRESSION: Small bilateral pleural effusions and basilar atelectasis.  Original Report Authenticated By: Andreas Newport, M.D.   Dg Tibia/fibula Right  11/26/2011  *RADIOLOGY REPORT*  Clinical Data: Cellulitis  RIGHT TIBIA AND FIBULA - 2 VIEW  Comparison: None  Findings: The right tibia and fibula appear intact.  No evidence of acute fracture or subluxation.  No focal bone erosion or sclerosis. No evidence of osteomyelitis.  No focal bone lesion or expansile change.  Bone cortex and trabecular architecture appear intact. Vascular calcifications and calcified phleboliths in the soft tissues.  No radiopaque foreign bodies.  No obvious subcutaneous gas collections.  Mild degenerative changes in the knee.  IMPRESSION: Right tibia and fibula appear intact without evidence of bone erosion or osteomyelitis.  Original Report Authenticated By: Marlon Pel, M.D.   Ct Head Wo Contrast  11/26/2011  *RADIOLOGY REPORT*  Clinical Data:  Altered  mental status after fall this morning.  CT HEAD WITHOUT CONTRAST CT CERVICAL SPINE WITHOUT CONTRAST  Technique:  Multidetector CT imaging of the head and cervical spine was performed following the standard protocol without intravenous contrast.  Multiplanar CT image reconstructions of the cervical spine were also generated.  Comparison:  CT head 12/31/2008  CT HEAD  Findings: Diffuse bilateral cerebral and cerebellar atrophy.  Focal encephalomalacia in the right frontal lobe consistent with old infarct and stable since the prior study.  Ventricular dilatation consistent with central atrophy.  No mass effect or midline shift. No abnormal extra-axial fluid collections.  Gray-white matter junctions are distinct.  Basal cisterns are not effaced.  No evidence of acute  intracranial hemorrhage.  Vascular calcifications.  No depressed skull fractures.  Mild supraorbital and nasal soft tissue swelling.  Mild irregularity of the nasal bones appear stable suggesting old fractures.  Mild mucosal membrane thickening in the paranasal sinuses.  No acute air-fluid levels are appreciated.  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  The no evidence of acute intracranial hemorrhage, mass lesion, or acute infarct.  CT CERVICAL SPINE  Findings: Slight retrolisthesis of left lateral mass of C1 with respect to C2, likely degenerative.  Otherwise normal alignment of the cervical vertebrae and facet joints.  Fusion of the C2-3 facet joint on the left which may be congenital or degenerative. Extensive degenerative changes throughout the facet joints bilaterally.  Degenerative changes throughout the cervical spine with narrowed cervical interspaces and associated endplate hypertrophic changes.  Prominent posterior disc osteophyte complexes at C3-4, C5-6, and C6-7 levels.  Degenerative changes and bone erosion involving C1-2.  No significant vertebral compression deformity.  No prevertebral soft tissue swelling.  No significant infiltration into the paraspinal soft tissues.  Vascular calcifications in the carotid and vertebral arteries.  IMPRESSION: Diffuse degenerative change throughout the cervical spine with mild retrolisthesis of the left lateral mass of C1 with respect C2, likely degenerative.  No displaced fractures are identified.  Original Report Authenticated By: Marlon Pel, M.D.   Dg Chest Portable 1 View  11/26/2011  *RADIOLOGY REPORT*  Clinical Data: Fever  PORTABLE CHEST - 1 VIEW  Comparison: 03/30/2010  Findings: Slightly shallow inspiration.  Heart size and pulmonary vascularity are mildly increased.  Findings may suggest early congestive change although some of this could be attributed to technique.  No perihilar edema.  No  focal airspace consolidation in the lungs.  No blunting of costophrenic angles.  No pneumothorax.  IMPRESSION: Mild cardiac enlargement and pulmonary vascular prominence.  No consolidation or edema.  Original Report Authenticated By: Marlon Pel, M.D.    Microbiology: Recent Results (from the past 240 hour(s))  URINE CULTURE     Status: Normal   Collection Time   11/26/11  9:06 PM      Component Value Range Status Comment   Specimen Description URINE, CATHETERIZED   Final    Special Requests NONE   Final    Setup Time [id number]   Final    Colony Count NO GROWTH   Final    Culture NO GROWTH   Final    Report Status 11/27/2011 FINAL   Final   CULTURE, BLOOD (ROUTINE X 2)     Status: Normal   Collection Time   11/26/11 10:00 PM      Component Value Range Status Comment   Specimen Description BLOOD LEFT HAND   Final    Special Requests BOTTLES DRAWN AEROBIC  ONLY   Final    Setup Time [id number]   Final    Culture NO GROWTH 5 DAYS   Final    Report Status 12/03/2011 FINAL   Final   CULTURE, BLOOD (ROUTINE X 2)     Status: Normal   Collection Time   11/26/11 10:21 PM      Component Value Range Status Comment   Specimen Description BLOOD RIGHT HAND   Final    Special Requests BOTTLES DRAWN AEROBIC ONLY 10CC   Final    Setup Time [id number]   Final    Culture NO GROWTH 5 DAYS   Final    Report Status 12/03/2011 FINAL   Final   MRSA PCR SCREENING     Status: Normal   Collection Time   11/27/11  2:53 AM      Component Value Range Status Comment   MRSA by PCR NEGATIVE  NEGATIVE  Final   WOUND CULTURE     Status: Normal   Collection Time   11/27/11  8:56 AM      Component Value Range Status Comment   Specimen Description WOUND RIGHT LOWER LEG   Final    Special Requests Normal   Final    Gram Stain     Final    Value: NO WBC SEEN     RARE SQUAMOUS EPITHELIAL CELLS PRESENT     FEW GRAM POSITIVE COCCI     IN PAIRS   Culture     Final    Value: FEW STAPHYLOCOCCUS AUREUS      Note: RIFAMPIN AND GENTAMICIN SHOULD NOT BE USED AS SINGLE DRUGS FOR TREATMENT OF STAPH INFECTIONS.     FEW GROUP B STREP(S.AGALACTIAE)ISOLATED     Note: TESTING AGAINST S. AGALACTIAE NOT ROUTINELY PERFORMED DUE TO PREDICTABILITY OF AMP/PEN/VAN SUSCEPTIBILITY.   Report Status 12/01/2011 FINAL   Final    Organism ID, Bacteria STAPHYLOCOCCUS AUREUS   Final   CLOSTRIDIUM DIFFICILE BY PCR     Status: Normal   Collection Time   12/01/11  1:22 AM      Component Value Range Status Comment   C difficile by pcr NEGATIVE  NEGATIVE  Final      Labs: Basic Metabolic Panel:  Lab 12/01/11 2130 11/30/11 0500 11/29/11 0605 11/28/11 0950 11/27/11 0350  NA 129* 133* 135 136 136  K 3.8 3.4* -- -- --  CL 98 101 105 105 105  CO2 25 25 22 23 20   GLUCOSE 88 83 97 100* 117*  BUN 6 6 8 10 11   CREATININE 0.62 0.62 0.67 0.68 0.85  CALCIUM 7.8* 7.9* 7.8* 7.8* 7.6*  MG -- -- -- 1.5 --  PHOS -- -- -- -- --   Liver Function Tests:  Lab 11/26/11 2223  AST 29  ALT 15  ALKPHOS 79  BILITOT 0.8  PROT 5.7*  ALBUMIN 3.1*   CBC:  Lab 11/30/11 0500 11/29/11 0605 11/28/11 0950 11/27/11 0350 11/26/11 2207  WBC 14.3* 20.0* 23.7* 30.1* 12.0*  NEUTROABS -- -- -- 27.7* 11.4*  HGB 10.2* 10.2* 10.7* 10.7* 11.0*  HCT 29.9* 29.7* 31.6* 31.6* 32.1*  MCV 95.8 95.8 97.2 97.2 97.9  PLT 127* 132* 125* 142* 131*   Cardiac Enzymes:  Lab 11/26/11 2222  CKTOTAL --  CKMB --  CKMBINDEX --  TROPONINI <0.30   CBG:  Lab 11/29/11 1708 11/29/11 1213 11/29/11 0732 11/28/11 2113 11/28/11 1531  GLUCAP 102* 103* 92 99 128*    Time coordinating discharge: 45  minutes.  Signed:  Brendia Sacks, MD  [facility] 12/03/2011, 4:10 PM

## 2011-12-03 NOTE — Progress Notes (Signed)
ANTIBIOTIC CONSULT NOTE - FOLLOW UP  Pharmacy Consult for Coumadin/Vancomycin/Unasyn Indication: atrial fibrillation/cellulitis and sepsis  No Known Allergies  Patient Measurements: Height: 6' (182.9 cm) Weight: 218 lb 7.6 oz (99.1 kg) IBW/kg (Calculated) : 77.6   Vital Signs: Temp: 98.4 F (36.9 C) (01/09 0500) BP: 149/74 mmHg (01/09 0500) Pulse Rate: 67  (01/09 0500) Intake/Output from previous day: 01/08 0701 - 01/09 0700 In: 600 [IV Piggyback:600] Out: 1700 [Urine:1700] Intake/Output from this shift: Total I/O In: -  Out: 100 [Urine:100]  Labs:  [facility] 12/01/11 0520  WBC --  HGB --  PLT --  LABCREA --  CREATININE 0.62   Estimated Creatinine Clearance: 83.8 ml/min (by C-G formula based on Cr of 0.62).  Basename 12/03/11 1100  VANCOTROUGH 14.5  VANCOPEAK --  Drue Dun --  GENTTROUGH --  GENTPEAK --  GENTRANDOM --  TOBRATROUGH --  TOBRAPEAK --  TOBRARND --  AMIKACINPEAK --  AMIKACINTROU --  AMIKACIN --     Microbiology: Recent Results (from the past 720 hour(s))  URINE CULTURE     Status: Normal   Collection Time   11/26/11  9:06 PM      Component Value Range Status Comment   Specimen Description URINE, CATHETERIZED   Final    Special Requests NONE   Final    Setup Time [id number]   Final    Colony Count NO GROWTH   Final    Culture NO GROWTH   Final    Report Status 11/27/2011 FINAL   Final   CULTURE, BLOOD (ROUTINE X 2)     Status: Normal   Collection Time   11/26/11 10:00 PM      Component Value Range Status Comment   Specimen Description BLOOD LEFT HAND   Final    Special Requests BOTTLES DRAWN AEROBIC ONLY   Final    Setup Time [id number]   Final    Culture NO GROWTH 5 DAYS   Final    Report Status 12/03/2011 FINAL   Final   CULTURE, BLOOD (ROUTINE X 2)     Status: Normal   Collection Time   11/26/11 10:21 PM      Component Value Range Status Comment   Specimen Description BLOOD RIGHT HAND   Final    Special Requests BOTTLES  DRAWN AEROBIC ONLY 10CC   Final    Setup Time [id number]   Final    Culture NO GROWTH 5 DAYS   Final    Report Status 12/03/2011 FINAL   Final   MRSA PCR SCREENING     Status: Normal   Collection Time   11/27/11  2:53 AM      Component Value Range Status Comment   MRSA by PCR NEGATIVE  NEGATIVE  Final   WOUND CULTURE     Status: Normal   Collection Time   11/27/11  8:56 AM      Component Value Range Status Comment   Specimen Description WOUND RIGHT LOWER LEG   Final    Special Requests Normal   Final    Gram Stain     Final    Value: NO WBC SEEN     RARE SQUAMOUS EPITHELIAL CELLS PRESENT     FEW GRAM POSITIVE COCCI     IN PAIRS   Culture     Final    Value: FEW STAPHYLOCOCCUS AUREUS     Note: RIFAMPIN AND GENTAMICIN SHOULD NOT BE USED AS SINGLE DRUGS FOR TREATMENT OF STAPH INFECTIONS.  FEW GROUP B STREP(S.AGALACTIAE)ISOLATED     Note: TESTING AGAINST S. AGALACTIAE NOT ROUTINELY PERFORMED DUE TO PREDICTABILITY OF AMP/PEN/VAN SUSCEPTIBILITY.   Report Status 12/01/2011 FINAL   Final    Organism ID, Bacteria STAPHYLOCOCCUS AUREUS   Final   CLOSTRIDIUM DIFFICILE BY PCR     Status: Normal   Collection Time   12/01/11  1:22 AM      Component Value Range Status Comment   C difficile by pcr NEGATIVE  NEGATIVE  Final     Anti-infectives     Start     Dose/Rate Route Frequency Ordered Stop   11/27/11 2200   vancomycin (VANCOCIN) IVPB 1000 mg/200 mL premix        1,000 mg 200 mL/hr over 60 Minutes Intravenous Every 12 hours 11/27/11 1343     11/27/11 1000   vancomycin (VANCOCIN) 1,250 mg in sodium chloride 0.9 % 250 mL IVPB  Status:  Discontinued        1,250 mg 166.7 mL/hr over 90 Minutes Intravenous Every 12 hours 11/27/11 0338 11/27/11 1343   11/27/11 0400   piperacillin-tazobactam (ZOSYN) IVPB 3.375 g  Status:  Discontinued        3.375 g 12.5 mL/hr over 240 Minutes Intravenous 3 times per day 11/27/11 0338 11/27/11 0344   11/27/11 0400   Ampicillin-Sulbactam (UNASYN) 3 g in  sodium chloride 0.9 % 100 mL IVPB        3 g 100 mL/hr over 60 Minutes Intravenous Every 6 hours 11/27/11 0345     11/26/11 2345   vancomycin (VANCOCIN) IVPB 1000 mg/200 mL premix        1,000 mg 200 mL/hr over 60 Minutes Intravenous  Once 11/26/11 2336 11/27/11 0045   11/26/11 2230   clindamycin (CLEOCIN) IVPB 600 mg        600 mg 100 mL/hr over 30 Minutes Intravenous  Once 11/26/11 2223 11/26/11 2336          Assessment: [name] on coumadin for afib. INR within therapeutic range. No bleeding noted per chart Patient is also on D#7 vancomycin and unasyn for cellulitis and sepsis at admission, currently afebrile, wound cx positive for MSSA, but will continue to treat with vanc and unasyn for now until discharge. Vancomycin level within therapeutic range. No new scr, but uop is good in the past 2 days Goal of Therapy:  Vancomycin trough level 10-15 mcg/ml INR = 2-3  Plan:  1. Coumadin 5mg  po x 1 2. Continue vancomycin 1g IV Q12hrs and unasyn 3g IV Q6hrs 3. Will continue monitor INR and renal fx daily  Riki Rusk 12/03/2011,12:07 PM

## 2011-12-03 NOTE — Progress Notes (Signed)
Pt and family provided with d/c instructions. Pt verbalized understanding. IV removed. VSS. Pt left unit in wheelchair with family. Ramond Craver, RN

## 2011-12-03 NOTE — Progress Notes (Signed)
   CARE MANAGEMENT NOTE 12/03/2011  Patient:  [name] [name], [name] [name]   Account Number:  [id number]  Date Initiated:  11/27/2011  Documentation initiated by:  [facility]  Subjective/Objective Assessment:   Admitted with presumed sepsis - hypotensive - lives with wife.     Action/Plan:   PT eval- recommending HHPT   Anticipated DC Date:  12/03/2011   Anticipated DC Plan:  HOME W HOME HEALTH SERVICES      DC Planning Services  CM consult      Choice offered to / List presented to:  C-1 Patient        HH arranged  HH-1 RN  HH-2 PT  HH-3 OT  HH-4 NURSE'S AIDE  HH-6 SOCIAL WORKER      HH agency  [facility]   Status of service:  In process, will continue to follow Medicare Important Message given?   (If response is "NO", the following Medicare IM given date fields will be blank) Date Medicare IM given:   Date Additional Medicare IM given:    Discharge Disposition:    Per UR Regulation:  Reviewed for med. necessity/level of care/duration of stay  Comments:  12/03/11 Spoke with Eunice Blase at [place] and requested PT,OT, RN, aide  and SW. Smith Mince RN, BSN, CCM   12/02/11-1330-J.Minnich,RN,BSN  [phone]     In to speak with patient, wife, and daughter regarding choice of agencies for home health. [facility] chosen. Debbie, RN with [facility] notified of referral. Patient has rolling walker, elevated toilet, and shower chair at home. No further needs identified at this time. CM will continue to monitor.  11-27-11 7:40am Avie Arenas, RNBSN [phone] UR completed.

## 2011-12-03 NOTE — Progress Notes (Signed)
Physical Therapy Treatment Patient Details Name: [name] [name] MRN: [id number] DOB: 16-Jan-1925 Today's Date: 12/03/2011  PT Assessment/Plan  PT - Assessment/Plan Comments on Treatment Session: Family education with daughter [name] [name] completed.  Daughter states that [employer]/family will assist patient and his wife x 24 hours.  Recommend HHPT f/u as well.  No equipment needs to be ordered as they have the equipment that they need.   PT Plan: Discharge plan remains appropriate;Frequency remains appropriate PT Frequency: Min 3X/week Follow Up Recommendations: Home health PT;Supervision/Assistance - 24 hour Equipment Recommended: None recommended by PT PT Goals  Acute Rehab PT Goals PT Goal Formulation: With patient PT Goal: Sit to Supine/Side - Progress: Other (comment) PT Goal: Sit to Stand - Progress: Progressing toward goal PT Goal: Stand to Sit - Progress: Progressing toward goal PT Goal: Ambulate - Progress: Progressing toward goal  PT Treatment Precautions/Restrictions  Precautions Precautions: Fall Required Braces or Orthoses: No Restrictions Weight Bearing Restrictions: No Mobility (including Balance) Bed Mobility Supine to Sit: Not tested (comment) Transfers Sit to Stand: 5: Supervision;With upper extremity assist;From chair/3-in-1;With armrests Sit to Stand Details (indicate cue type and reason): Needs time to obtain balance when he first stands up.  Needs occasional cues for hand placement.   Stand to Sit: 5: Supervision;With upper extremity assist;To bed Ambulation/Gait Ambulation/Gait Assistance: 5: Supervision Ambulation/Gait Assistance Details (indicate cue type and reason): Educated re: appropriate use of RW.  Patient did well with RW with only occasional cues needed for staying close to RW.  Daughter present and aware of patient's gait and how to cue pt.  Suggested they could purchase a gait belt if desired from gift shop.   Ambulation Distance (Feet): 200  Feet Assistive device: Rolling walker Gait Pattern: Decreased stride length;Trunk flexed (slightly.  Pt. more sure of himself with RW.  ) Gait velocity: 1.32 ft/sec indicating recurrent fall risk without device as well as considered neighborhood ambulator.   Stairs: No Corporate treasurer: No  Posture/Postural Control Posture/Postural Control: No significant limitations Balance Balance Assessed: Yes Static Standing Balance Static Standing - Balance Support: Bilateral upper extremity supported;During functional activity Static Standing - Level of Assistance: 5: Stand by assistance Static Standing - Comment/# of Minutes: 1 minute Exercise  General Exercises - Lower Extremity Short Arc Quad: AROM;Both;5 reps;Supine Hip ABduction/ADduction: AROM;Both;5 reps;Standing Straight Leg Raises: AROM;Both;5 reps;Supine Hip Flexion/Marching: AROM;Both;5 reps;Standing Mini-Sqauts: AROM;Both;5 reps;Standing Other Exercises Other Exercises: Hip extension bil LE 5 reps AROM in standing End of Session PT - End of Session Equipment Utilized During Treatment: Gait belt Activity Tolerance: Patient tolerated treatment well Patient left: in bed;with call bell in reach;with family/visitor present Nurse Communication: Mobility status for ambulation General Behavior During Session: [facility] for tasks performed Cognition: [facility] for tasks performed  INGOLD,Cammie Faulstich 12/03/2011, 11:24 AM [facility] [phone] (pager)

## 2011-12-03 NOTE — Progress Notes (Signed)
PROGRESS NOTE  [name] [name] ZOX:[id number] DOB: 1925-04-29 DOA: 11/26/2011 PCP: Evette Georges, MD  Brief narrative: 78 year old man presented emergency room with fever, atrial fibrillation with rapid ventricular response, altered metal status and a fall. He was admitted by the critical care service for sepsis secondary to cellulitis, atrial fibrillation with rapid ventricular response.  Past medical history: Atrial fibrillation, rheumatoid arthritis, carotid artery occlusion, coronary artery disease, hyperlipidemia, hypertension, TIA  Consultants:  Initially admitted by critical care and then transferred to the hospitalist service  Physical therapy: Home health PT. Rolling walker with 5 inch wheels.  Procedures:  January 3-4: Central venous catheter insertion  Antibiotics:  January 3: Vancomycin/Zosyn  Interim History: Interval documentation reviewed. Subjective: Feels well. No complaints.  Objective: Filed Vitals:   12/02/11 2021 12/02/11 2200 12/03/11 0500 12/03/11 1300  BP:  136/73 149/74 114/68  Pulse:  84 67 77  Temp:  98.3 F (36.8 C) 98.4 F (36.9 C) 98 F (36.7 C)  TempSrc:    Oral  Resp:  18 18 20   Height:      Weight:      SpO2: 97% 97% 97% 97%    Intake/Output Summary (Last 24 hours) at 12/03/11 1515 Last data filed at 12/03/11 0818  Gross per 24 hour  Intake    300 ml  Output   1600 ml  Net  -1300 ml    Exam:  General: Appears calm and comfortable. Cardiovascular: Regular rate and rhythm. No murmur, rub or gallop. 1+ ankle edema bilaterally. Telemetry: Atrial fibrillation with controlled ventricular rate. Respiratory: Clear to auscultation bilaterally. No wheezes, rales or rhonchi. Normal respiratory effort. Skin: Erythema bilateral lower extremities appears to have resolved or nearly resolved. Nontender to palpation bilateral lower extremities.   Data Reviewed: Basic Metabolic Panel:  Lab 12/01/11 8119 11/30/11 0500 11/29/11 0605  11/28/11 0950 11/27/11 0350  NA 129* 133* 135 136 136  K 3.8 3.4* -- -- --  CL 98 101 105 105 105  CO2 25 25 22 23 20   GLUCOSE 88 83 97 100* 117*  BUN 6 6 8 10 11   CREATININE 0.62 0.62 0.67 0.68 0.85  CALCIUM 7.8* 7.9* 7.8* 7.8* 7.6*  MG -- -- -- 1.5 --  PHOS -- -- -- -- --   Liver Function Tests:  Lab 11/26/11 2223  AST 29  ALT 15  ALKPHOS 79  BILITOT 0.8  PROT 5.7*  ALBUMIN 3.1*   CBC:  Lab 11/30/11 0500 11/29/11 0605 11/28/11 0950 11/27/11 0350 11/26/11 2207  WBC 14.3* 20.0* 23.7* 30.1* 12.0*  NEUTROABS -- -- -- 27.7* 11.4*  HGB 10.2* 10.2* 10.7* 10.7* 11.0*  HCT 29.9* 29.7* 31.6* 31.6* 32.1*  MCV 95.8 95.8 97.2 97.2 97.9  PLT 127* 132* 125* 142* 131*   Cardiac Enzymes:  Lab 11/26/11 2222  CKTOTAL --  CKMB --  CKMBINDEX --  TROPONINI <0.30   CBG:  Lab 11/29/11 1708 11/29/11 1213 11/29/11 0732 11/28/11 2113 11/28/11 1531  GLUCAP 102* 103* 92 99 128*    Recent Results (from the past 240 hour(s))  URINE CULTURE     Status: Normal   Collection Time   11/26/11  9:06 PM      Component Value Range Status Comment   Specimen Description URINE, CATHETERIZED   Final    Special Requests NONE   Final    Setup Time [id number]   Final    Colony Count NO GROWTH   Final    Culture NO GROWTH   Final  Report Status 11/27/2011 FINAL   Final   CULTURE, BLOOD (ROUTINE X 2)     Status: Normal   Collection Time   11/26/11 10:00 PM      Component Value Range Status Comment   Specimen Description BLOOD LEFT HAND   Final    Special Requests BOTTLES DRAWN AEROBIC ONLY   Final    Setup Time [id number]   Final    Culture NO GROWTH 5 DAYS   Final    Report Status 12/03/2011 FINAL   Final   CULTURE, BLOOD (ROUTINE X 2)     Status: Normal   Collection Time   11/26/11 10:21 PM      Component Value Range Status Comment   Specimen Description BLOOD RIGHT HAND   Final    Special Requests BOTTLES DRAWN AEROBIC ONLY 10CC   Final    Setup Time [id number]   Final    Culture  NO GROWTH 5 DAYS   Final    Report Status 12/03/2011 FINAL   Final   MRSA PCR SCREENING     Status: Normal   Collection Time   11/27/11  2:53 AM      Component Value Range Status Comment   MRSA by PCR NEGATIVE  NEGATIVE  Final   WOUND CULTURE     Status: Normal   Collection Time   11/27/11  8:56 AM      Component Value Range Status Comment   Specimen Description WOUND RIGHT LOWER LEG   Final    Special Requests Normal   Final    Gram Stain     Final    Value: NO WBC SEEN     RARE SQUAMOUS EPITHELIAL CELLS PRESENT     FEW GRAM POSITIVE COCCI     IN PAIRS   Culture     Final    Value: FEW STAPHYLOCOCCUS AUREUS     Note: RIFAMPIN AND GENTAMICIN SHOULD NOT BE USED AS SINGLE DRUGS FOR TREATMENT OF STAPH INFECTIONS.     FEW GROUP B STREP(S.AGALACTIAE)ISOLATED     Note: TESTING AGAINST S. AGALACTIAE NOT ROUTINELY PERFORMED DUE TO PREDICTABILITY OF AMP/PEN/VAN SUSCEPTIBILITY.   Report Status 12/01/2011 FINAL   Final    Organism ID, Bacteria STAPHYLOCOCCUS AUREUS   Final   CLOSTRIDIUM DIFFICILE BY PCR     Status: Normal   Collection Time   12/01/11  1:22 AM      Component Value Range Status Comment   C difficile by pcr NEGATIVE  NEGATIVE  Final      Studies: Dg Chest 2 View  12/02/2011  *RADIOLOGY REPORT*  Clinical Data: Wheezing.  Weakness.  Atrial fibrillation.  CHEST - 2 VIEW  Comparison: 11/27/2011.  Findings: Cardiopericardial silhouette is within normal limits. Basilar atelectasis is present.  Small bilateral posteriorly layering pleural effusions.  No airspace disease.  No consolidation.  Aortic arch atherosclerosis.  Mild right apical pleural scarring.  Interval removal of right subclavian central line.  IMPRESSION: Small bilateral pleural effusions and basilar atelectasis.  Original Report Authenticated By: Andreas Newport, M.D.   Dg Tibia/fibula Right  11/26/2011  *RADIOLOGY REPORT*  Clinical Data: Cellulitis  RIGHT TIBIA AND FIBULA - 2 VIEW  Comparison: None  Findings: The right tibia  and fibula appear intact.  No evidence of acute fracture or subluxation.  No focal bone erosion or sclerosis. No evidence of osteomyelitis.  No focal bone lesion or expansile change.  Bone cortex and trabecular architecture appear intact. Vascular calcifications and calcified  phleboliths in the soft tissues.  No radiopaque foreign bodies.  No obvious subcutaneous gas collections.  Mild degenerative changes in the knee.  IMPRESSION: Right tibia and fibula appear intact without evidence of bone erosion or osteomyelitis.  Original Report Authenticated By: Marlon Pel, M.D.   Ct Head Wo Contrast  11/26/2011  *RADIOLOGY REPORT*  Clinical Data:  Altered mental status after fall this morning.  CT HEAD WITHOUT CONTRAST CT CERVICAL SPINE WITHOUT CONTRAST  Technique:  Multidetector CT imaging of the head and cervical spine was performed following the standard protocol without intravenous contrast.  Multiplanar CT image reconstructions of the cervical spine were also generated.  Comparison:  CT head 12/31/2008  CT HEAD  Findings: Diffuse bilateral cerebral and cerebellar atrophy.  Focal encephalomalacia in the right frontal lobe consistent with old infarct and stable since the prior study.  Ventricular dilatation consistent with central atrophy.  No mass effect or midline shift. No abnormal extra-axial fluid collections.  Gray-white matter junctions are distinct.  Basal cisterns are not effaced.  No evidence of acute intracranial hemorrhage.  Vascular calcifications.  No depressed skull fractures.  Mild supraorbital and nasal soft tissue swelling.  Mild irregularity of the nasal bones appear stable suggesting old fractures.  Mild mucosal membrane thickening in the paranasal sinuses.  No acute air-fluid levels are appreciated.  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  The no evidence of acute intracranial hemorrhage, mass lesion, or acute infarct.  CT CERVICAL SPINE   Findings: Slight retrolisthesis of left lateral mass of C1 with respect to C2, likely degenerative.  Otherwise normal alignment of the cervical vertebrae and facet joints.  Fusion of the C2-3 facet joint on the left which may be congenital or degenerative. Extensive degenerative changes throughout the facet joints bilaterally.  Degenerative changes throughout the cervical spine with narrowed cervical interspaces and associated endplate hypertrophic changes.  Prominent posterior disc osteophyte complexes at C3-4, C5-6, and C6-7 levels.  Degenerative changes and bone erosion involving C1-2.  No significant vertebral compression deformity.  No prevertebral soft tissue swelling.  No significant infiltration into the paraspinal soft tissues.  Vascular calcifications in the carotid and vertebral arteries.  IMPRESSION: Diffuse degenerative change throughout the cervical spine with mild retrolisthesis of the left lateral mass of C1 with respect C2, likely degenerative.  No displaced fractures are identified.  Original Report Authenticated By: Marlon Pel, M.D.   Dg Chest Port 1 View  11/27/2011  *RADIOLOGY REPORT*  Clinical Data: Right IJ line placement  PORTABLE CHEST - 1 VIEW  Comparison: 11/26/2011  Findings: Placement of right central venous catheter with tip over the mid SVC region.  No pneumothorax.  Otherwise stable appearance of the chest.  Mild cardiac enlargement and pulmonary vascular prominence.  No blunting of costophrenic angles.  Calcification of the aorta.  IMPRESSION: Interval placement of right subclavian venous catheter with tip over the mid SVC region.  No pneumothorax.  Original Report Authenticated By: Marlon Pel, M.D.   Dg Chest Portable 1 View  11/26/2011  *RADIOLOGY REPORT*  Clinical Data: Fever  PORTABLE CHEST - 1 VIEW  Comparison: 03/30/2010  Findings: Slightly shallow inspiration.  Heart size and pulmonary vascularity are mildly increased.  Findings may suggest early  congestive change although some of this could be attributed to technique.  No perihilar edema.  No focal airspace consolidation in the lungs.  No blunting of costophrenic angles.  No pneumothorax.  IMPRESSION: Mild cardiac enlargement and  pulmonary vascular prominence.  No consolidation or edema.  Original Report Authenticated By: Marlon Pel, M.D.    Scheduled Meds:   . ampicillin-sulbactam (UNASYN) IV  3 g Intravenous Q6H  . aspirin  81 mg Oral Daily  . collagenase   Topical Daily  . digoxin  0.125 mg Oral Daily  . hydrocerin   Topical BID  . metoprolol  50 mg Oral BID  . niacin  1,000 mg Oral QHS  . simvastatin  20 mg Oral Daily  . vancomycin  1,000 mg Intravenous Q12H  . warfarin  5 mg Oral ONCE-1800  . warfarin  5 mg Oral ONCE-1800   Continuous Infusions:    Assessment/Plan: 1. Sepsis secondary to right lower extremity cellulitis: Resolved. Required high-volume fluid resuscitation and vasopressors. 2. Right lower extremity cellulitis: Blood cultures no growth. Wound culture with group B strep and MSSA sensitive to Levaquin. Completed 7 days of IV antibiotics. Additional 7 days of Levaquin. 3. Atrial fibrillation with rapid ventricular response: Ventricular rate control. INR therapeutic. Continue warfarin on discharge. 4. Acute delirium secondary to #1: Resolved. 5. Hyponatremia: Needs close outpatient followup. 6. Thrombocytopenia: Mild. Stable. Followup as an outpatient. 7. Anemia: Stable. Followup as an outpatient.  Family Communication: Granddaughter Victorino Dike - cell - [phone] Disposition Plan: Home with home health today  HH-1 RN  HH-2 PT  HH-3 OT  HH-4 NURSE'S AIDE  HH-6 SOCIAL WORKER     Brendia Sacks, MD  [facility] Pager [phone] 12/03/2011, 3:15 PM    LOS: 7 days

## 2011-12-03 NOTE — Progress Notes (Signed)
12/03/2011 [facility], Bosie Clos SPARKS Case Management Note [phone]    CARE MANAGEMENT NOTE 12/03/2011  Patient:  [name] [name], [name] [name]   Account Number:  [id number]  Date Initiated:  11/27/2011  Documentation initiated by:  [facility]  Subjective/Objective Assessment:   Admitted with presumed sepsis - hypotensive - lives with wife.     Action/Plan:   Anticipated DC Date:  12/02/2011   Anticipated DC Plan:  HOME W HOME HEALTH SERVICES      DC Planning Services  CM consult      Choice offered to / List presented to:             Status of service:  In process, will continue to follow Medicare Important Message given?   (If response is "NO", the following Medicare IM given date fields will be blank) Date Medicare IM given:   Date Additional Medicare IM given:    Discharge Disposition:    Per UR Regulation:  Reviewed for med. necessity/level of care/duration of stay  Comments:  12/02/11-1330-J.Yorley Buch,RN,BSN  [phone]     In to speak with patient, wife, and daughter regarding choice of agencies for home health. [facility] chosen. Debbie, RN with [facility] notified of referral. Patient has rolling walker, elevated toilet, and shower chair at home. No further needs identified at this time. CM will continue to monitor.  11-27-11 7:40am Avie Arenas, RNBSN [phone] UR completed.

## 2011-12-04 ENCOUNTER — Telehealth: Payer: Self-pay | Admitting: Family Medicine

## 2011-12-04 DIAGNOSIS — L02419 Cutaneous abscess of limb, unspecified: Secondary | ICD-10-CM | POA: Diagnosis not present

## 2011-12-04 DIAGNOSIS — I2510 Atherosclerotic heart disease of native coronary artery without angina pectoris: Secondary | ICD-10-CM | POA: Diagnosis not present

## 2011-12-04 DIAGNOSIS — L03119 Cellulitis of unspecified part of limb: Secondary | ICD-10-CM | POA: Diagnosis not present

## 2011-12-04 DIAGNOSIS — M069 Rheumatoid arthritis, unspecified: Secondary | ICD-10-CM | POA: Diagnosis not present

## 2011-12-04 DIAGNOSIS — L97809 Non-pressure chronic ulcer of other part of unspecified lower leg with unspecified severity: Secondary | ICD-10-CM | POA: Diagnosis not present

## 2011-12-04 DIAGNOSIS — I872 Venous insufficiency (chronic) (peripheral): Secondary | ICD-10-CM | POA: Diagnosis not present

## 2011-12-04 NOTE — Telephone Encounter (Signed)
Advise should come from the doctor who ordered the medication

## 2011-12-04 NOTE — Telephone Encounter (Signed)
[name] [name] with [facility] called and said pt just got home from hosptial. Pt has rash on chest, back and arms. Pt has been started on Levaquin for ?sepcis in rt lower cellulitis. Should pt stop the Levaquin.

## 2011-12-05 ENCOUNTER — Emergency Department (HOSPITAL_COMMUNITY)
Admission: EM | Admit: 2011-12-05 | Discharge: 2011-12-05 | Disposition: A | Discharge status: Home / Self Care | Payer: Medicare Other | Attending: Emergency Medicine | Admitting: Emergency Medicine

## 2011-12-05 ENCOUNTER — Other Ambulatory Visit: Payer: Self-pay | Admitting: Family Medicine

## 2011-12-05 ENCOUNTER — Telehealth: Payer: Self-pay | Admitting: Family Medicine

## 2011-12-05 ENCOUNTER — Encounter (HOSPITAL_COMMUNITY): Payer: Self-pay | Admitting: *Deleted

## 2011-12-05 ENCOUNTER — Ambulatory Visit (INDEPENDENT_AMBULATORY_CARE_PROVIDER_SITE_OTHER): Payer: Self-pay | Admitting: Cardiology

## 2011-12-05 DIAGNOSIS — I4891 Unspecified atrial fibrillation: Secondary | ICD-10-CM | POA: Insufficient documentation

## 2011-12-05 DIAGNOSIS — L97809 Non-pressure chronic ulcer of other part of unspecified lower leg with unspecified severity: Secondary | ICD-10-CM | POA: Diagnosis not present

## 2011-12-05 DIAGNOSIS — I10 Essential (primary) hypertension: Secondary | ICD-10-CM | POA: Insufficient documentation

## 2011-12-05 DIAGNOSIS — T368X5A Adverse effect of other systemic antibiotics, initial encounter: Secondary | ICD-10-CM | POA: Insufficient documentation

## 2011-12-05 DIAGNOSIS — E785 Hyperlipidemia, unspecified: Secondary | ICD-10-CM | POA: Insufficient documentation

## 2011-12-05 DIAGNOSIS — Z7901 Long term (current) use of anticoagulants: Secondary | ICD-10-CM

## 2011-12-05 DIAGNOSIS — I872 Venous insufficiency (chronic) (peripheral): Secondary | ICD-10-CM | POA: Diagnosis not present

## 2011-12-05 DIAGNOSIS — Z888 Allergy status to other drugs, medicaments and biological substances status: Secondary | ICD-10-CM | POA: Diagnosis not present

## 2011-12-05 DIAGNOSIS — L270 Generalized skin eruption due to drugs and medicaments taken internally: Secondary | ICD-10-CM | POA: Diagnosis not present

## 2011-12-05 DIAGNOSIS — L0390 Cellulitis, unspecified: Secondary | ICD-10-CM

## 2011-12-05 DIAGNOSIS — I252 Old myocardial infarction: Secondary | ICD-10-CM | POA: Insufficient documentation

## 2011-12-05 DIAGNOSIS — I2510 Atherosclerotic heart disease of native coronary artery without angina pectoris: Secondary | ICD-10-CM | POA: Diagnosis not present

## 2011-12-05 DIAGNOSIS — L02419 Cutaneous abscess of limb, unspecified: Secondary | ICD-10-CM | POA: Insufficient documentation

## 2011-12-05 DIAGNOSIS — R0989 Other specified symptoms and signs involving the circulatory and respiratory systems: Secondary | ICD-10-CM

## 2011-12-05 DIAGNOSIS — Z8679 Personal history of other diseases of the circulatory system: Secondary | ICD-10-CM

## 2011-12-05 DIAGNOSIS — M069 Rheumatoid arthritis, unspecified: Secondary | ICD-10-CM | POA: Diagnosis not present

## 2011-12-05 DIAGNOSIS — Z8673 Personal history of transient ischemic attack (TIA), and cerebral infarction without residual deficits: Secondary | ICD-10-CM | POA: Diagnosis not present

## 2011-12-05 DIAGNOSIS — R21 Rash and other nonspecific skin eruption: Secondary | ICD-10-CM | POA: Diagnosis not present

## 2011-12-05 DIAGNOSIS — L03119 Cellulitis of unspecified part of limb: Secondary | ICD-10-CM | POA: Diagnosis not present

## 2011-12-05 DIAGNOSIS — Z889 Allergy status to unspecified drugs, medicaments and biological substances status: Secondary | ICD-10-CM

## 2011-12-05 MED ORDER — FAMOTIDINE 20 MG PO TABS
20.0000 mg | ORAL_TABLET | Freq: Once | ORAL | Status: AC
  Administered 2011-12-05: 20 mg via ORAL
  Filled 2011-12-05: qty 1

## 2011-12-05 MED ORDER — CEPHALEXIN 500 MG PO CAPS: 500.0000 mg | ORAL_CAPSULE | Freq: Four times a day (QID) | ORAL | Status: DC

## 2011-12-05 MED ORDER — PREDNISONE 10 MG PO TABS: 10.0000 mg | ORAL_TABLET | Freq: Every day | ORAL | Status: DC

## 2011-12-05 MED ORDER — DIPHENHYDRAMINE HCL 25 MG PO CAPS
25.0000 mg | ORAL_CAPSULE | Freq: Once | ORAL | Status: AC
  Administered 2011-12-05: 25 mg via ORAL
  Filled 2011-12-05: qty 1

## 2011-12-05 MED ORDER — HYDROCODONE-ACETAMINOPHEN 5-325 MG PO TABS: 2.0000 | ORAL_TABLET | ORAL | Status: DC | PRN

## 2011-12-05 MED ORDER — PREDNISONE 20 MG PO TABS
60.0000 mg | ORAL_TABLET | Freq: Once | ORAL | Status: AC
  Administered 2011-12-05: 60 mg via ORAL
  Filled 2011-12-05: qty 3

## 2011-12-05 NOTE — Telephone Encounter (Signed)
Notified Verlon Au at [facility] and Elita Quick (daughter).

## 2011-12-05 NOTE — Telephone Encounter (Signed)
Pt's daughter [name] is calling stating that the hospitalist gave the pt the Levaquin, and there is  No way to talk to him.  The Levaquin has been stopped x 2 days now, but they want a replacement antibiotic due to his current cellulitis.  Please call [name] [name] and pt daughter back with answers.  [name])  [phone] Daughter [name])  [phone]

## 2011-12-05 NOTE — Telephone Encounter (Signed)
Cephalexin  #28 one qid

## 2011-12-05 NOTE — ED Notes (Signed)
Pt st's rash started yesterday am  Rash worse on abd and back pt st's very painful to touch.    Pt denies itching, no resp.  problems

## 2011-12-05 NOTE — Telephone Encounter (Signed)
Chart opened in error

## 2011-12-05 NOTE — ED Provider Notes (Signed)
History     CSN: [id number]  Arrival date & time 12/05/11  1524   First MD Initiated Contact with Patient 12/05/11 1646      Chief Complaint  Patient presents with  . Rash    (Consider location/radiation/quality/duration/timing/severity/associated sxs/prior treatment) HPI [name] [name] is a 78 y.o. male presents with c/o rash leading to desire to be assessed in the ED. The sx(s) have been present for 2 days. Additional concerns are itching. Causative factors are possibly Levaquin. Palliative factors are none. The distress associated is moderate. The disorder has been present for 2 days. He was recently discharged from the hospital after treatment for bilateral leg cellulitis. Cellulitis is being treated with Levaquin and twice a week. Dressing changes to the lower extremities. The rash has begun insidiously in the last 24 hours. He has never had a reaction like this before. He went to a dermatologist today about the rash and was suggested to come here for evaluation.  Past Medical History  Diagnosis Date  . Arrhythmia   . Carotid artery occlusion     right total internal artery occlusion  . Coronary artery disease     s/p cardiac cath in 1990s, and cardiolite study  in 2005 showing EF 54%  . Hyperlipidemia   . Hypertension   . TIA (transient ischemic attack)   . Atrial fibrillation   . Myocardial infarction 1992 and 1994    history of  . Degenerative disc disease     cervical spine    Past Surgical History  Procedure Date  . Cardiac catheterization 1990s  . Cataract extraction, bilateral     Family History  Problem Relation Age of Onset  . Heart disease Mother   . Malignant hyperthermia Mother   . Heart disease Father   . Malignant hyperthermia Father     History  Substance Use Topics  . Smoking status: Former Smoker -- 1.0 packs/day for 40 years    Types: Cigarettes  . Smokeless tobacco: Former Neurosurgeon    Quit date: 11/26/1990   Comment: quit around 1992  .  Alcohol Use: No     former, quit drinking in 1992      Review of Systems  All other systems reviewed and are negative.    Allergies  Levaquin  Home Medications   Current Outpatient Rx  Name Route Sig Dispense Refill  . ACETAMINOPHEN 325 MG PO TABS Oral Take 650 mg by mouth every 6 (six) hours as needed. For pain    . ASPIRIN 81 MG PO TABS Oral Take 81 mg by mouth daily.      . ATORVASTATIN CALCIUM 10 MG PO TABS Oral Take 1 tablet (10 mg total) by mouth daily. 90 tablet 3  . CALTRATE 600 PLUS-VIT D PO Oral Take by mouth 1 day or 1 dose.     . COLLAGENASE 250 UNIT/GM EX OINT Topical Apply topically daily. Apply topically daily. Apply to necrotic tissue of bil. LE wounds, cover with dry dressing, change daily. Once non viable tissue removed with debridement agent ok to switch to covering with silicone foam dressing only. 15 g 0  . DIGOXIN 0.125 MG PO TABS Oral Take 1 tablet (125 mcg total) by mouth daily. 90 tablet 3  . DIPHENHYDRAMINE HCL 25 MG PO TABS Oral Take 25 mg by mouth every 6 (six) hours as needed. For itching    . HYDROCERIN EX CREA Topical Apply 1 application topically 2 (two) times daily. Apply topically 2 (two) times  daily. Apply to dry skin of bil. LE and feet, but not between toes 113 g 0  . METHOTREXATE (ANTI-RHEUMATIC) 2.5 MG PO TABS Oral Take 2.5 mg by mouth 2 (two) times a week. 4 tabs Sunday only    . METOPROLOL SUCCINATE ER 50 MG PO TB24 Oral Take 1 tablet (50 mg total) by mouth 2 (two) times daily. 180 tablet 3  . CENTRUM SILVER PO Oral Take by mouth.      Marland Kitchen NIACIN ER (ANTIHYPERLIPIDEMIC) 1000 MG PO TBCR Oral Take 1 tablet (1,000 mg total) by mouth at bedtime. 2 tabs at bedtime 180 tablet 2    2 po qhs  . NITROGLYCERIN 0.4 MG SL SUBL Sublingual Place 0.4 mg under the tongue every 5 (five) minutes as needed. For chest pain     . TRAMADOL HCL 50 MG PO TABS Oral Take 50 mg by mouth every 8 (eight) hours as needed. For pain    . WARFARIN SODIUM 5 MG PO TABS Oral Take  5 mg by mouth daily. Mondays and Fridays only pt takes a half tab (2.5mg ) All other days 1 tab (5mg )    . HYDROCODONE-ACETAMINOPHEN 5-325 MG PO TABS Oral Take 2 tablets by mouth every 4 (four) hours as needed for pain. 20 tablet 0  . PREDNISONE 10 MG PO TABS Oral Take 1 tablet (10 mg total) by mouth daily. Take q day 6,5,4,3,2,1 21 tablet 0    BP 110/58  Pulse 79  Temp(Src) 97.5 F (36.4 C) (Oral)  Resp 18  Ht 6' (1.829 m)  Wt 204 lb (92.534 kg)  BMI 27.67 kg/m2  SpO2 97%  Physical Exam  Nursing note and vitals reviewed. Constitutional: He is oriented to person, place, and time. He appears well-developed and well-nourished.  HENT:  Head: Normocephalic and atraumatic.  Right Ear: External ear normal.  Left Ear: External ear normal.       No skin sloughing or angioedema  Eyes: Conjunctivae and EOM are normal. Pupils are equal, round, and reactive to light.  Neck: Normal range of motion and phonation normal. Neck supple.  Cardiovascular: Normal rate, regular rhythm, normal heart sounds and intact distal pulses.   Pulmonary/Chest: Effort normal and breath sounds normal. He exhibits no bony tenderness.  Abdominal: Soft. Normal appearance. There is no tenderness.  Musculoskeletal: Normal range of motion.       His legs are wrapped with dressing material to the knees bilaterally.  Neurological: He is alert and oriented to person, place, and time. He has normal strength. No cranial nerve deficit or sensory deficit. He exhibits normal muscle tone. Coordination normal.  Skin: Skin is warm, dry and intact.       He is a red, slightly raised, mostly confluent rash from the scalp hairline to the thighs above the knees bilaterally. The rash is tender to touch, especially on the posterior thorax. There are no areas of weepiness or skin breakdown. There is no rash on the face.  Psychiatric: He has a normal mood and affect. His behavior is normal. Judgment and thought content normal.    ED Course    Procedures (including critical care time) Evaluation is consistent with drug reaction to Levaquin. Can start in emergency Department prednisone, Benadryl, and Pepcid.  7:14 PM Reevaluation with update and discussion. After initial assessment and treatment, an updated evaluation reveals he is more comfortable at this time, but would like some pain medicine to use because the rash is painful. His PCP called in a  prescription for Keflex, to continue the treatment for the cellulitis. Pacey Willadsen L      1. Drug allergy   2. Cellulitis       MDM  Allergy to Levaquin, stable for discharge.        Flint Melter, MD 12/05/11 [phone]

## 2011-12-05 NOTE — Telephone Encounter (Signed)
Requesting a verbal order to continue ot services for 4 weeks. He has cellulits. Please return call. thnaks

## 2011-12-05 NOTE — Telephone Encounter (Signed)
Per original phone call on this note, Home Health states he has a rash all over his entire body.

## 2011-12-05 NOTE — Telephone Encounter (Signed)
Why  has patient stopped antibiotic therapy?  Return office visit with Dr. Tawanna Cooler on Monday

## 2011-12-05 NOTE — ED Notes (Signed)
The pt has a generalized rash over his entire body that started yesterday.  The rash does not itch.  The pt just came from the hospital 2 days ago  Where he was getting iv levaquinn and was sent home with  Oral. Painful to touch

## 2011-12-08 DIAGNOSIS — M069 Rheumatoid arthritis, unspecified: Secondary | ICD-10-CM | POA: Diagnosis not present

## 2011-12-08 DIAGNOSIS — L03119 Cellulitis of unspecified part of limb: Secondary | ICD-10-CM | POA: Diagnosis not present

## 2011-12-08 DIAGNOSIS — L97809 Non-pressure chronic ulcer of other part of unspecified lower leg with unspecified severity: Secondary | ICD-10-CM | POA: Diagnosis not present

## 2011-12-08 DIAGNOSIS — I2510 Atherosclerotic heart disease of native coronary artery without angina pectoris: Secondary | ICD-10-CM | POA: Diagnosis not present

## 2011-12-08 DIAGNOSIS — L02419 Cutaneous abscess of limb, unspecified: Secondary | ICD-10-CM | POA: Diagnosis not present

## 2011-12-08 DIAGNOSIS — I872 Venous insufficiency (chronic) (peripheral): Secondary | ICD-10-CM | POA: Diagnosis not present

## 2011-12-08 NOTE — Telephone Encounter (Signed)
Fleet Contras please call how is occupational therapy helping with a cellulitis?????????

## 2011-12-09 DIAGNOSIS — L03119 Cellulitis of unspecified part of limb: Secondary | ICD-10-CM | POA: Diagnosis not present

## 2011-12-09 DIAGNOSIS — I2510 Atherosclerotic heart disease of native coronary artery without angina pectoris: Secondary | ICD-10-CM | POA: Diagnosis not present

## 2011-12-09 DIAGNOSIS — I872 Venous insufficiency (chronic) (peripheral): Secondary | ICD-10-CM | POA: Diagnosis not present

## 2011-12-09 DIAGNOSIS — M069 Rheumatoid arthritis, unspecified: Secondary | ICD-10-CM | POA: Diagnosis not present

## 2011-12-09 DIAGNOSIS — L97809 Non-pressure chronic ulcer of other part of unspecified lower leg with unspecified severity: Secondary | ICD-10-CM | POA: Diagnosis not present

## 2011-12-09 NOTE — Telephone Encounter (Signed)
Spoke with [name] [name] and Dr Tawanna Cooler did not order OP

## 2011-12-10 ENCOUNTER — Telehealth: Payer: Self-pay | Admitting: Family Medicine

## 2011-12-10 NOTE — Telephone Encounter (Signed)
Fleet Contras please call the daughter, and find out what the issues are  The best thing would be for them to come to the office tomorrow so we can sit down and talk, about what's going on

## 2011-12-10 NOTE — Telephone Encounter (Signed)
Spoke with daughter and she is unable to come in for an office visit but she refused.  She would not give any information.  She would only speak with Dr Tawanna Cooler.

## 2011-12-10 NOTE — Telephone Encounter (Signed)
Pt daughter would like dr todd to call her today concerning her father and [facility]

## 2011-12-11 DIAGNOSIS — M069 Rheumatoid arthritis, unspecified: Secondary | ICD-10-CM | POA: Diagnosis not present

## 2011-12-11 DIAGNOSIS — L02419 Cutaneous abscess of limb, unspecified: Secondary | ICD-10-CM | POA: Diagnosis not present

## 2011-12-11 DIAGNOSIS — I2510 Atherosclerotic heart disease of native coronary artery without angina pectoris: Secondary | ICD-10-CM | POA: Diagnosis not present

## 2011-12-11 DIAGNOSIS — L97809 Non-pressure chronic ulcer of other part of unspecified lower leg with unspecified severity: Secondary | ICD-10-CM | POA: Diagnosis not present

## 2011-12-11 DIAGNOSIS — I872 Venous insufficiency (chronic) (peripheral): Secondary | ICD-10-CM | POA: Diagnosis not present

## 2011-12-11 NOTE — Telephone Encounter (Signed)
[name] [name].......... I talked to his daughter, [name] [name], and she is going to bring him in on Monday at 430.  Please add  on

## 2011-12-12 ENCOUNTER — Ambulatory Visit (INDEPENDENT_AMBULATORY_CARE_PROVIDER_SITE_OTHER): Payer: Self-pay | Admitting: Family Medicine

## 2011-12-12 DIAGNOSIS — I872 Venous insufficiency (chronic) (peripheral): Secondary | ICD-10-CM | POA: Diagnosis not present

## 2011-12-12 DIAGNOSIS — I4891 Unspecified atrial fibrillation: Secondary | ICD-10-CM

## 2011-12-12 DIAGNOSIS — Z8679 Personal history of other diseases of the circulatory system: Secondary | ICD-10-CM

## 2011-12-12 DIAGNOSIS — L02419 Cutaneous abscess of limb, unspecified: Secondary | ICD-10-CM | POA: Diagnosis not present

## 2011-12-12 DIAGNOSIS — Z7901 Long term (current) use of anticoagulants: Secondary | ICD-10-CM

## 2011-12-12 DIAGNOSIS — I2510 Atherosclerotic heart disease of native coronary artery without angina pectoris: Secondary | ICD-10-CM | POA: Diagnosis not present

## 2011-12-12 DIAGNOSIS — L97809 Non-pressure chronic ulcer of other part of unspecified lower leg with unspecified severity: Secondary | ICD-10-CM | POA: Diagnosis not present

## 2011-12-12 DIAGNOSIS — M069 Rheumatoid arthritis, unspecified: Secondary | ICD-10-CM | POA: Diagnosis not present

## 2011-12-12 DIAGNOSIS — R0989 Other specified symptoms and signs involving the circulatory and respiratory systems: Secondary | ICD-10-CM

## 2011-12-12 NOTE — Telephone Encounter (Signed)
Please add Mr Guzzetta to Dr Nelida Meuse schedule

## 2011-12-15 ENCOUNTER — Encounter: Payer: Self-pay | Admitting: Family Medicine

## 2011-12-15 ENCOUNTER — Ambulatory Visit (INDEPENDENT_AMBULATORY_CARE_PROVIDER_SITE_OTHER): Payer: Medicare Other | Admitting: Family Medicine

## 2011-12-15 DIAGNOSIS — B351 Tinea unguium: Secondary | ICD-10-CM

## 2011-12-15 DIAGNOSIS — I2510 Atherosclerotic heart disease of native coronary artery without angina pectoris: Secondary | ICD-10-CM | POA: Diagnosis not present

## 2011-12-15 DIAGNOSIS — L03119 Cellulitis of unspecified part of limb: Secondary | ICD-10-CM | POA: Diagnosis not present

## 2011-12-15 DIAGNOSIS — L02419 Cutaneous abscess of limb, unspecified: Secondary | ICD-10-CM | POA: Diagnosis not present

## 2011-12-15 DIAGNOSIS — D518 Other vitamin B12 deficiency anemias: Secondary | ICD-10-CM | POA: Diagnosis not present

## 2011-12-15 DIAGNOSIS — M069 Rheumatoid arthritis, unspecified: Secondary | ICD-10-CM | POA: Diagnosis not present

## 2011-12-15 DIAGNOSIS — L97809 Non-pressure chronic ulcer of other part of unspecified lower leg with unspecified severity: Secondary | ICD-10-CM | POA: Diagnosis not present

## 2011-12-15 DIAGNOSIS — D649 Anemia, unspecified: Secondary | ICD-10-CM

## 2011-12-15 DIAGNOSIS — D519 Vitamin B12 deficiency anemia, unspecified: Secondary | ICD-10-CM

## 2011-12-15 DIAGNOSIS — I872 Venous insufficiency (chronic) (peripheral): Secondary | ICD-10-CM | POA: Diagnosis not present

## 2011-12-15 NOTE — Patient Instructions (Signed)
Soak your feet twice daily and apply antibiotic ointment and a Band-Aid to the left great toe.  Hold the Coumadin tonight restart y  Coumadin on Wednesday by taking a half a tablet daily.  Soak his feet.  Weekly and file,,,,,,,,,  The toenails.  Return Thursday for follow-up

## 2011-12-15 NOTE — Progress Notes (Signed)
  Subjective:    Patient ID: [name] [name], male    DOB: Dec 13, 1924, 78 y.o.   MRN: [id number]  HPI [name] [name] is an 78 year old male, who comes in today for follow-up having been hospitalized for sepsis.  Around the first of the year he developed a soft tissue infection in his right lower extremity, which then evolved into a full leg cellulitis and secondary sepsis.  He was hospitalized on IV antibiotics discharged on oral antibiotics and now comes in today for follow-up.  The original area is healing.  There however, the wrapping his legs daily, and is making his legs swell more.  He also has a history of anemia.  When he was discharged his hemoglobin was 10.  No workup was done.  Also, his Coumadin was recently increased because his INR was 1.9.  He continues to get PT and OT.  However, he doesn't feel that doing anything for him that he can do himself.  He still restricted from driving   Review of Systems General review of systems otherwise negative    Objective:   Physical Exam Well-developed well-nourished, male in no acute distress.  Examination of the skin shows the lesion to be well healing on the right lower extremity.  There is edema that has been triggered by the wrapping.  His toenails.  All have fungus and and I spent about 15 minutes trimming all his toenails       Assessment & Plan:  Cellulitis and sepsis, right lower extremity, resolved.  Peripheral edema, resolving.  Fungal infection of his toenails soak and file weekly.  Anemia.  Check labs.  Return Friday

## 2011-12-16 ENCOUNTER — Telehealth: Payer: Self-pay | Admitting: Family Medicine

## 2011-12-16 DIAGNOSIS — L03119 Cellulitis of unspecified part of limb: Secondary | ICD-10-CM | POA: Diagnosis not present

## 2011-12-16 DIAGNOSIS — I872 Venous insufficiency (chronic) (peripheral): Secondary | ICD-10-CM | POA: Diagnosis not present

## 2011-12-16 DIAGNOSIS — M069 Rheumatoid arthritis, unspecified: Secondary | ICD-10-CM | POA: Diagnosis not present

## 2011-12-16 DIAGNOSIS — L02419 Cutaneous abscess of limb, unspecified: Secondary | ICD-10-CM | POA: Diagnosis not present

## 2011-12-16 DIAGNOSIS — L97809 Non-pressure chronic ulcer of other part of unspecified lower leg with unspecified severity: Secondary | ICD-10-CM | POA: Diagnosis not present

## 2011-12-16 DIAGNOSIS — I2510 Atherosclerotic heart disease of native coronary artery without angina pectoris: Secondary | ICD-10-CM | POA: Diagnosis not present

## 2011-12-16 LAB — IRON AND TIBC
%SAT: 39 % (ref 20–55)
TIBC: 379 ug/dL (ref 215–435)

## 2011-12-16 LAB — BASIC METABOLIC PANEL
BUN: 11 mg/dL (ref 6–23)
CO2: 29 mEq/L (ref 19–32)
Chloride: 101 mEq/L (ref 96–112)
Creatinine, Ser: 0.8 mg/dL (ref 0.4–1.5)
Glucose, Bld: 82 mg/dL (ref 70–99)

## 2011-12-16 LAB — CBC WITH DIFFERENTIAL/PLATELET
Basophils Relative: 1.6 % (ref 0.0–3.0)
Eosinophils Absolute: 0.3 10*3/uL (ref 0.0–0.7)
Eosinophils Relative: 3.4 % (ref 0.0–5.0)
Hemoglobin: 11.3 g/dL — ABNORMAL LOW (ref 13.0–17.0)
Lymphocytes Relative: 16.6 % (ref 12.0–46.0)
MCHC: 33.2 g/dL (ref 30.0–36.0)
Monocytes Relative: 11.8 % (ref 3.0–12.0)
Neutro Abs: 6.3 10*3/uL (ref 1.4–7.7)
RBC: 3.41 Mil/uL — ABNORMAL LOW (ref 4.22–5.81)
WBC: 9.4 10*3/uL (ref 4.5–10.5)

## 2011-12-16 LAB — FERRITIN: Ferritin: 68.5 ng/mL (ref 22.0–322.0)

## 2011-12-16 LAB — RETICULOCYTES
RBC.: 3.52 MIL/uL — ABNORMAL LOW (ref 4.22–5.81)
Retic Ct Pct: 2.3 % (ref 0.4–2.3)

## 2011-12-16 LAB — VITAMIN B12: Vitamin B-12: 719 pg/mL (ref 211–911)

## 2011-12-16 NOTE — Telephone Encounter (Signed)
Patient no longer requires PT or OT

## 2011-12-16 NOTE — Telephone Encounter (Signed)
Therapist wants orders to continue treatment. Please call therapist at # provided. Pt was confused as to if the PT was to be continued. Therapist wants to confirm. Thanks!

## 2011-12-17 NOTE — Telephone Encounter (Signed)
Home health aware.

## 2011-12-18 ENCOUNTER — Ambulatory Visit (INDEPENDENT_AMBULATORY_CARE_PROVIDER_SITE_OTHER): Payer: Medicare Other | Admitting: Family Medicine

## 2011-12-18 ENCOUNTER — Encounter: Payer: Self-pay | Admitting: Family Medicine

## 2011-12-18 DIAGNOSIS — L02419 Cutaneous abscess of limb, unspecified: Secondary | ICD-10-CM | POA: Diagnosis not present

## 2011-12-18 DIAGNOSIS — R609 Edema, unspecified: Secondary | ICD-10-CM

## 2011-12-18 DIAGNOSIS — L03119 Cellulitis of unspecified part of limb: Secondary | ICD-10-CM | POA: Diagnosis not present

## 2011-12-18 DIAGNOSIS — R600 Localized edema: Secondary | ICD-10-CM | POA: Insufficient documentation

## 2011-12-18 MED ORDER — FUROSEMIDE 20 MG PO TABS: ORAL_TABLET | ORAL | Status: DC

## 2011-12-18 NOTE — Progress Notes (Signed)
  Subjective:    Patient ID: [name] [name], male    DOB: May 24, 1925, 78 y.o.   MRN: [id number]  HPI [name] [name] is an 78 year old, married man, nonsmoker, who comes in today for follow-up of a severe cellulitis of his lower extremities, which resulted in sepsis.  He was hospitalized and now is managed at home.  He is off the antibiotics and his legs are healing.  However, there still swollen.  He's been sitting in his recliner!!!!!!!!!!!!!.  He states he feels well.  No complaints.   Review of Systems General and vascular review of systems otherwise negative    Objective:   Physical Exam  Well-developed well-nourished, male in no acute distress.  Examination lower extremity shows the legs to be healing.  There is 2+ edema.  No infection      Assessment & Plan:  Cellulitis resolved now with peripheral edema.  Plan elevate legs.  No recliner.  No salt diet.  Lasix 20 daily for leg stockings thigh high return Monday

## 2011-12-18 NOTE — Patient Instructions (Signed)
1, Lasix tablet now,,,,,,,,,,,,,,, then one daily, every morning starting tomorrow morning.  Salt free diet.  Elevate your legs,,,,,,,,,, no recliner.  Return on Monday afternoon for follow-up

## 2011-12-19 ENCOUNTER — Ambulatory Visit: Payer: Self-pay | Admitting: Cardiovascular Disease

## 2011-12-19 DIAGNOSIS — M069 Rheumatoid arthritis, unspecified: Secondary | ICD-10-CM | POA: Diagnosis not present

## 2011-12-19 DIAGNOSIS — I4891 Unspecified atrial fibrillation: Secondary | ICD-10-CM

## 2011-12-19 DIAGNOSIS — I2510 Atherosclerotic heart disease of native coronary artery without angina pectoris: Secondary | ICD-10-CM | POA: Diagnosis not present

## 2011-12-19 DIAGNOSIS — I872 Venous insufficiency (chronic) (peripheral): Secondary | ICD-10-CM | POA: Diagnosis not present

## 2011-12-19 DIAGNOSIS — L03119 Cellulitis of unspecified part of limb: Secondary | ICD-10-CM | POA: Diagnosis not present

## 2011-12-19 DIAGNOSIS — Z7901 Long term (current) use of anticoagulants: Secondary | ICD-10-CM

## 2011-12-19 DIAGNOSIS — Z8679 Personal history of other diseases of the circulatory system: Secondary | ICD-10-CM

## 2011-12-19 DIAGNOSIS — L97809 Non-pressure chronic ulcer of other part of unspecified lower leg with unspecified severity: Secondary | ICD-10-CM | POA: Diagnosis not present

## 2011-12-22 ENCOUNTER — Encounter: Payer: Self-pay | Admitting: Family Medicine

## 2011-12-22 ENCOUNTER — Ambulatory Visit (INDEPENDENT_AMBULATORY_CARE_PROVIDER_SITE_OTHER): Payer: Medicare Other | Admitting: Family Medicine

## 2011-12-22 VITALS — BP 112/74 | Wt 193.0 lb

## 2011-12-22 DIAGNOSIS — Z7901 Long term (current) use of anticoagulants: Secondary | ICD-10-CM

## 2011-12-22 DIAGNOSIS — Z8679 Personal history of other diseases of the circulatory system: Secondary | ICD-10-CM | POA: Diagnosis not present

## 2011-12-22 DIAGNOSIS — I4891 Unspecified atrial fibrillation: Secondary | ICD-10-CM | POA: Diagnosis not present

## 2011-12-22 DIAGNOSIS — L02419 Cutaneous abscess of limb, unspecified: Secondary | ICD-10-CM

## 2011-12-22 DIAGNOSIS — I10 Essential (primary) hypertension: Secondary | ICD-10-CM | POA: Diagnosis not present

## 2011-12-22 LAB — POCT INR: INR: 2.3

## 2011-12-22 NOTE — Patient Instructions (Addendum)
  Latest dosing instructions   Total Sun Mon Tue Wed Thu Fri Sat   30 5 mg 2.5 mg 5 mg 5 mg 5 mg 2.5 mg 5 mg    (5 mg1) (5 mg0.5) (5 mg1) (5 mg1) (5 mg1) (5 mg0.5) (5 mg1)       Continue your current medication  Remember to put on a stockings before you get out of bed  But your legs with Lubriderm or other cream twice daily  Return in one week for followup  I will request a consult from [facility] to see what needs u have going forward

## 2011-12-23 ENCOUNTER — Encounter: Payer: Self-pay | Admitting: Family Medicine

## 2011-12-23 DIAGNOSIS — L97809 Non-pressure chronic ulcer of other part of unspecified lower leg with unspecified severity: Secondary | ICD-10-CM | POA: Diagnosis not present

## 2011-12-23 DIAGNOSIS — I872 Venous insufficiency (chronic) (peripheral): Secondary | ICD-10-CM | POA: Diagnosis not present

## 2011-12-23 DIAGNOSIS — L03119 Cellulitis of unspecified part of limb: Secondary | ICD-10-CM | POA: Diagnosis not present

## 2011-12-23 DIAGNOSIS — I2510 Atherosclerotic heart disease of native coronary artery without angina pectoris: Secondary | ICD-10-CM | POA: Diagnosis not present

## 2011-12-23 DIAGNOSIS — L02419 Cutaneous abscess of limb, unspecified: Secondary | ICD-10-CM | POA: Diagnosis not present

## 2011-12-23 DIAGNOSIS — M069 Rheumatoid arthritis, unspecified: Secondary | ICD-10-CM | POA: Diagnosis not present

## 2011-12-23 LAB — BASIC METABOLIC PANEL
BUN: 12 mg/dL (ref 6–23)
Calcium: 8.3 mg/dL — ABNORMAL LOW (ref 8.4–10.5)
GFR: 86.46 mL/min (ref >60.00)
Potassium: 4.1 mEq/L (ref 3.5–5.1)
Sodium: 137 mEq/L (ref 135–145)

## 2011-12-23 NOTE — Progress Notes (Signed)
  Subjective:    Patient ID: Eston Esters, male    DOB: 03/17/1925, 78 y.o.   MRN: [id number]  HPI Mr. Cowles is a 78 year old male who comes in today accompanied by his grandson for reevaluation of cellulitis of his lower extremities that resulted in sepsis.  When we saw him the other day the legs were well healed except he had 2+ edema. This has been treated by home health care with wrapping his legs with Ace wrap . We instructed him on a salt free diet start him on Lasix 20 mg daily and have him fitted for thigh-high folate stockings which she is putting on the morning before he gets out of bed. Now the edema it has diminished to 1+. The skin looks much better the eschar from the previous lesions are healing and he's lost 3 pounds in 3 days.   Review of Systems    Gen. And dermatologic review of systems otherwise negative Objective:   Physical Exam Well-developed well-nourished male in no acute distress examination of the lower legs show 1+ edema the skin is much pinker and looks healthier.       Assessment & Plan:  Venous insufficiency with secondary cellulitis and subsequent sepsis now resolving slowly plan continue elevation no salt Lasix 20 daily folate stockings followup in one week I think he would be a good candidate for a [facility]

## 2011-12-26 DIAGNOSIS — L02419 Cutaneous abscess of limb, unspecified: Secondary | ICD-10-CM | POA: Diagnosis not present

## 2011-12-26 DIAGNOSIS — I872 Venous insufficiency (chronic) (peripheral): Secondary | ICD-10-CM | POA: Diagnosis not present

## 2011-12-26 DIAGNOSIS — I2510 Atherosclerotic heart disease of native coronary artery without angina pectoris: Secondary | ICD-10-CM | POA: Diagnosis not present

## 2011-12-26 DIAGNOSIS — M069 Rheumatoid arthritis, unspecified: Secondary | ICD-10-CM | POA: Diagnosis not present

## 2011-12-26 DIAGNOSIS — L97809 Non-pressure chronic ulcer of other part of unspecified lower leg with unspecified severity: Secondary | ICD-10-CM | POA: Diagnosis not present

## 2011-12-26 HISTORY — PX: SKIN CANCER EXCISION: SHX779

## 2011-12-29 ENCOUNTER — Encounter: Payer: Self-pay | Admitting: Family Medicine

## 2011-12-29 ENCOUNTER — Ambulatory Visit (INDEPENDENT_AMBULATORY_CARE_PROVIDER_SITE_OTHER): Payer: Medicare Other | Admitting: Family Medicine

## 2011-12-29 VITALS — BP 120/78 | Temp 97.7°F | Wt 195.0 lb

## 2011-12-29 DIAGNOSIS — R609 Edema, unspecified: Secondary | ICD-10-CM

## 2011-12-29 MED ORDER — FUROSEMIDE 10 MG/ML PO SOLN: ORAL | Status: DC

## 2011-12-29 NOTE — Progress Notes (Signed)
  Subjective:    Patient ID: [name] [name], male    DOB: [birth year], 78 y.o.   MRN: [id number]  HPI [name] [name] is an 78 year old male who comes in today accompanied by his daughter from [place] for evaluation of venous insufficiency  He currently is using a moisturizer twice daily. He also wears his stockings daily  He takes Lasix 20 mg daily initially lost 3 pounds however he edema is still not gone.  He has an ulcerated lesion on the middle of the scalp referred to dermatology   Review of Systems    general and metabolic review of systems otherwise negative Objective:   Physical Exam  Well-developed well-nourished male in no acute distress examination of skin shows a quarter size lesion midforehead  Peripheral vascular exam shows normal pulses 2+ edema      Assessment & Plan:  Venous insufficiency improving not at goal plan increase Lasix to 30 mg daily continue stockings no salt diet elevation and moisturizer cream twice daily return in 2 weeks for followup  Obvious skin cancer midforehead refer to [name] [name]

## 2011-12-29 NOTE — Patient Instructions (Signed)
Take the 20 mg dose of Lasix in the morning  Take a 10 mg tablet at noon  Continue to use the moisturizing cream twice daily and where your stockings as you're currently doing  Every opportunity you can elevate your legs  Remember no salt  Followup in 2 weeks  Call Dr. Margo Aye at [facility] dermatology tomorrow for evaluation of the lesion on your forehead

## 2011-12-31 ENCOUNTER — Ambulatory Visit (INDEPENDENT_AMBULATORY_CARE_PROVIDER_SITE_OTHER): Payer: Self-pay | Admitting: Internal Medicine

## 2011-12-31 DIAGNOSIS — I2510 Atherosclerotic heart disease of native coronary artery without angina pectoris: Secondary | ICD-10-CM | POA: Diagnosis not present

## 2011-12-31 DIAGNOSIS — I872 Venous insufficiency (chronic) (peripheral): Secondary | ICD-10-CM | POA: Diagnosis not present

## 2011-12-31 DIAGNOSIS — Z7901 Long term (current) use of anticoagulants: Secondary | ICD-10-CM

## 2011-12-31 DIAGNOSIS — R0989 Other specified symptoms and signs involving the circulatory and respiratory systems: Secondary | ICD-10-CM

## 2011-12-31 DIAGNOSIS — Z8679 Personal history of other diseases of the circulatory system: Secondary | ICD-10-CM

## 2011-12-31 DIAGNOSIS — M069 Rheumatoid arthritis, unspecified: Secondary | ICD-10-CM | POA: Diagnosis not present

## 2011-12-31 DIAGNOSIS — I4891 Unspecified atrial fibrillation: Secondary | ICD-10-CM

## 2011-12-31 DIAGNOSIS — L02419 Cutaneous abscess of limb, unspecified: Secondary | ICD-10-CM | POA: Diagnosis not present

## 2011-12-31 DIAGNOSIS — L97809 Non-pressure chronic ulcer of other part of unspecified lower leg with unspecified severity: Secondary | ICD-10-CM | POA: Diagnosis not present

## 2012-01-07 DIAGNOSIS — C44320 Squamous cell carcinoma of skin of unspecified parts of face: Secondary | ICD-10-CM | POA: Diagnosis not present

## 2012-01-09 ENCOUNTER — Ambulatory Visit (INDEPENDENT_AMBULATORY_CARE_PROVIDER_SITE_OTHER): Payer: Medicare Other | Admitting: Family Medicine

## 2012-01-09 ENCOUNTER — Encounter: Payer: Self-pay | Admitting: Family Medicine

## 2012-01-09 VITALS — BP 120/74 | Temp 98.4°F | Wt 193.0 lb

## 2012-01-09 DIAGNOSIS — J069 Acute upper respiratory infection, unspecified: Secondary | ICD-10-CM | POA: Diagnosis not present

## 2012-01-09 MED ORDER — HYDROCODONE-HOMATROPINE 5-1.5 MG/5ML PO SYRP: 5.0000 mL | ORAL_SOLUTION | Freq: Four times a day (QID) | ORAL | Status: AC | PRN

## 2012-01-09 MED ORDER — PREDNISONE 10 MG PO TABS: ORAL_TABLET | ORAL | Status: DC

## 2012-01-09 NOTE — Progress Notes (Signed)
  Subjective:    Patient ID: [name] [name], male    DOB: February 10, 1925, 78 y.o.   MRN: [id number]  HPI  Patient seen with 2 to three-day history of URI type symptoms. Clear rhinorrhea and dry cough. Denies any fever or chills. No dyspnea. No wheezing. No body aches. Patient is an ex-smoker. Quit in 1992. Denies any sore throat. No nausea, vomiting, or diarrhea.   Review of Systems  Constitutional: Negative for fever and chills.  HENT: Positive for congestion and rhinorrhea. Negative for sore throat and sinus pressure.   Respiratory: Positive for cough.   Cardiovascular: Negative for chest pain.  Neurological: Negative for headaches.       Objective:   Physical Exam  Constitutional: He appears well-developed and well-nourished.  HENT:  Right Ear: External ear normal.  Left Ear: External ear normal.  Mouth/Throat: Oropharynx is clear and moist.  Neck: Neck supple.  Cardiovascular: Normal rate and regular rhythm.   Pulmonary/Chest: Effort normal. No respiratory distress. He has wheezes. He has no rales.          Assessment & Plan:  Viral upper respiratory infection. Hycodan cough syrup for nighttime use as needed. Does have mild reactive airway component. Prednisone taper which he has used in the past

## 2012-01-09 NOTE — Patient Instructions (Signed)

## 2012-01-13 ENCOUNTER — Ambulatory Visit (INDEPENDENT_AMBULATORY_CARE_PROVIDER_SITE_OTHER): Payer: Medicare Other | Admitting: Family Medicine

## 2012-01-13 ENCOUNTER — Encounter: Payer: Self-pay | Admitting: Family Medicine

## 2012-01-13 DIAGNOSIS — R609 Edema, unspecified: Secondary | ICD-10-CM

## 2012-01-13 DIAGNOSIS — I4891 Unspecified atrial fibrillation: Secondary | ICD-10-CM | POA: Diagnosis not present

## 2012-01-13 NOTE — Progress Notes (Signed)
  Subjective:    Patient ID: [name] [name], male    DOB: September 04, 1925, 78 y.o.   MRN: [id number]  HPI [name] [name] is a 78 year old male who comes in today for evaluation of 2 problems  He has a history of venous insufficiency subsequently developed a sialitis and sepsis and required hospitalization last December. He now is doing well his weight is down 7 pounds. He's taking is Lasix 20 mg in the morning and 10 mg at noon and wearing his full leg stockings.  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 We will check a pro time today.   Review of Systems    general and cardiovascular review of systems otherwise negative Objective:   Physical Exam Well-developed well-nourished male in no acute distress examination of lower extremities shows he does have a slow leg stockings on. It's the afternoon and he has about 1-2+ edema       Assessment & Plan:  Venous insufficiency much improved with Lasix and folate stockings continue current therapy followup in one month  History of atrial fibrillation check protime

## 2012-01-13 NOTE — Patient Instructions (Addendum)
Continue your current therapy and medications  Followup in one month sooner if any problems  Decrease the Coumadin to 2.5 mg daily followup pro time in 4 weeks when we see you here

## 2012-01-15 ENCOUNTER — Telehealth: Payer: Self-pay | Admitting: Family Medicine

## 2012-01-15 NOTE — Telephone Encounter (Signed)
Pt called and wants to know when Dr Tawanna Cooler thinks that the pt may be able to drive again?

## 2012-01-16 ENCOUNTER — Other Ambulatory Visit: Payer: Self-pay

## 2012-01-16 ENCOUNTER — Ambulatory Visit: Payer: Self-pay | Admitting: Cardiovascular Disease

## 2012-01-16 DIAGNOSIS — Z7901 Long term (current) use of anticoagulants: Secondary | ICD-10-CM

## 2012-01-16 DIAGNOSIS — Z8679 Personal history of other diseases of the circulatory system: Secondary | ICD-10-CM

## 2012-01-16 DIAGNOSIS — I4891 Unspecified atrial fibrillation: Secondary | ICD-10-CM

## 2012-01-16 NOTE — Telephone Encounter (Signed)
Pt states he is out of Niaspan, pt states if he is supposed to continue taking he needs a new rx sent to [facility] for a 90 day supply.  If he needs to resume right away he will need a script sent to [facility] [employer] for 10 day supply as he is totally out of Niaspan at the present.  Please call pt and advise.  Thanks

## 2012-01-22 MED ORDER — NIACIN ER (ANTIHYPERLIPIDEMIC) 1000 MG PO TBCR: 1000.0000 mg | EXTENDED_RELEASE_TABLET | Freq: Every day | ORAL | Status: DC

## 2012-01-26 ENCOUNTER — Encounter: Payer: Self-pay | Admitting: *Deleted

## 2012-01-26 ENCOUNTER — Encounter: Payer: Self-pay | Admitting: Family Medicine

## 2012-01-26 ENCOUNTER — Ambulatory Visit (INDEPENDENT_AMBULATORY_CARE_PROVIDER_SITE_OTHER): Payer: Medicare Other | Admitting: Family Medicine

## 2012-01-26 VITALS — BP 130/78 | Temp 97.7°F | Wt 190.0 lb

## 2012-01-26 DIAGNOSIS — J449 Chronic obstructive pulmonary disease, unspecified: Secondary | ICD-10-CM | POA: Diagnosis not present

## 2012-01-26 DIAGNOSIS — J4489 Other specified chronic obstructive pulmonary disease: Secondary | ICD-10-CM | POA: Insufficient documentation

## 2012-01-26 MED ORDER — PREDNISONE 20 MG PO TABS: ORAL_TABLET | ORAL | Status: DC

## 2012-01-26 MED ORDER — HYDROCODONE-HOMATROPINE 5-1.5 MG/5ML PO SYRP: ORAL_SOLUTION | ORAL | Status: DC

## 2012-01-26 NOTE — Progress Notes (Signed)
  Subjective:    Patient ID: [name] [name], male    DOB: [birth year], 78 y.o.   MRN: [id number]  HPI [name] [name] is an 78 year old male who comes in today to get a note so that he cannot serving jury duty,,,,,,,,, which I don't think he can because he can't drive and with his underlying neurologic concerns and memory issues I do not think he would be an appropriate candidate for the jury duty. A note was given to that effect.  He's had a cough now for about 2 weeks. He initially saw Dr. Sharman Cheek who gave him 7 days of prednisone which helped but the cough has persisted. No fever minimal sputum production. He did smoke for 40 years. Quit 20 years ago    Review of Systems General and pulmonary review of systems otherwise negative    Objective:   Physical Exam  Well-developed well-nourished male in no acute distress HEENT negative neck was supple no adenopathy lungs are clear except for some very mild late expiratory wheezing      Assessment & Plan:  COPD with asthma plan prednisone burst and taper return when necessary

## 2012-01-26 NOTE — Patient Instructions (Signed)
Vaporizer or humidifier in your bedroom at night  Prednisone one tablet x5 days, a half a tab x5 days, then a half a tablet Monday Wednesday Friday for a 3 week taper  Hydromet 1/2 teaspoon at bedtime when necessary  Return when necessary

## 2012-01-27 ENCOUNTER — Ambulatory Visit: Payer: Medicare Other | Admitting: Family Medicine

## 2012-01-27 NOTE — Telephone Encounter (Signed)
I discussed with the family they do not feel like he should drive and I concur

## 2012-01-30 ENCOUNTER — Ambulatory Visit (INDEPENDENT_AMBULATORY_CARE_PROVIDER_SITE_OTHER): Payer: Medicare Other | Admitting: Pharmacist

## 2012-01-30 DIAGNOSIS — Z7901 Long term (current) use of anticoagulants: Secondary | ICD-10-CM | POA: Diagnosis not present

## 2012-01-30 DIAGNOSIS — Z8679 Personal history of other diseases of the circulatory system: Secondary | ICD-10-CM

## 2012-01-30 DIAGNOSIS — I4891 Unspecified atrial fibrillation: Secondary | ICD-10-CM

## 2012-02-05 ENCOUNTER — Other Ambulatory Visit: Payer: Self-pay

## 2012-02-05 ENCOUNTER — Emergency Department (HOSPITAL_COMMUNITY): Payer: Medicare Other

## 2012-02-05 ENCOUNTER — Encounter: Payer: Self-pay | Admitting: Family Medicine

## 2012-02-05 ENCOUNTER — Ambulatory Visit (INDEPENDENT_AMBULATORY_CARE_PROVIDER_SITE_OTHER): Payer: Medicare Other | Admitting: Family Medicine

## 2012-02-05 ENCOUNTER — Emergency Department (HOSPITAL_COMMUNITY)
Admission: EM | Admit: 2012-02-05 | Discharge: 2012-02-05 | Disposition: A | Discharge status: Home / Self Care | Payer: Medicare Other | Attending: Emergency Medicine | Admitting: Emergency Medicine

## 2012-02-05 ENCOUNTER — Encounter (HOSPITAL_COMMUNITY): Payer: Self-pay | Admitting: *Deleted

## 2012-02-05 VITALS — BP 140/88 | Temp 97.7°F | Wt 185.0 lb

## 2012-02-05 DIAGNOSIS — R079 Chest pain, unspecified: Secondary | ICD-10-CM | POA: Diagnosis not present

## 2012-02-05 DIAGNOSIS — R1013 Epigastric pain: Secondary | ICD-10-CM | POA: Diagnosis not present

## 2012-02-05 DIAGNOSIS — R109 Unspecified abdominal pain: Secondary | ICD-10-CM | POA: Insufficient documentation

## 2012-02-05 DIAGNOSIS — E785 Hyperlipidemia, unspecified: Secondary | ICD-10-CM | POA: Insufficient documentation

## 2012-02-05 DIAGNOSIS — R143 Flatulence: Secondary | ICD-10-CM | POA: Diagnosis not present

## 2012-02-05 DIAGNOSIS — R142 Eructation: Secondary | ICD-10-CM | POA: Diagnosis not present

## 2012-02-05 DIAGNOSIS — R197 Diarrhea, unspecified: Secondary | ICD-10-CM | POA: Diagnosis not present

## 2012-02-05 DIAGNOSIS — R110 Nausea: Secondary | ICD-10-CM | POA: Insufficient documentation

## 2012-02-05 DIAGNOSIS — I2510 Atherosclerotic heart disease of native coronary artery without angina pectoris: Secondary | ICD-10-CM | POA: Insufficient documentation

## 2012-02-05 DIAGNOSIS — I4891 Unspecified atrial fibrillation: Secondary | ICD-10-CM | POA: Diagnosis not present

## 2012-02-05 DIAGNOSIS — I10 Essential (primary) hypertension: Secondary | ICD-10-CM | POA: Insufficient documentation

## 2012-02-05 DIAGNOSIS — E871 Hypo-osmolality and hyponatremia: Secondary | ICD-10-CM | POA: Diagnosis not present

## 2012-02-05 DIAGNOSIS — I252 Old myocardial infarction: Secondary | ICD-10-CM | POA: Insufficient documentation

## 2012-02-05 LAB — LIPASE, BLOOD: Lipase: 32 U/L (ref 11–59)

## 2012-02-05 LAB — CBC
HCT: 38 % — ABNORMAL LOW (ref 39.0–52.0)
MCHC: 33.7 g/dL (ref 30.0–36.0)
MCV: 94.3 fL (ref 78.0–100.0)
RDW: 16.8 % — ABNORMAL HIGH (ref 11.5–15.5)

## 2012-02-05 LAB — COMPREHENSIVE METABOLIC PANEL
AST: 43 U/L — ABNORMAL HIGH (ref 0–37)
Albumin: 3.2 g/dL — ABNORMAL LOW (ref 3.5–5.2)
BUN: 8 mg/dL (ref 6–23)
CO2: 26 mEq/L (ref 19–32)
Calcium: 8.7 mg/dL (ref 8.4–10.5)
Creatinine, Ser: 0.63 mg/dL (ref 0.50–1.35)
GFR calc non Af Amer: 88 mL/min — ABNORMAL LOW (ref >90)

## 2012-02-05 LAB — URINALYSIS, ROUTINE W REFLEX MICROSCOPIC
Glucose, UA: NEGATIVE mg/dL
Leukocytes, UA: NEGATIVE
Nitrite: NEGATIVE
Protein, ur: NEGATIVE mg/dL

## 2012-02-05 LAB — DIFFERENTIAL
Basophils Absolute: 0 10*3/uL (ref 0.0–0.1)
Basophils Relative: 0 % (ref 0–1)
Eosinophils Relative: 0 % (ref 0–5)
Monocytes Absolute: 0.8 10*3/uL (ref 0.1–1.0)

## 2012-02-05 LAB — POCT I-STAT TROPONIN I

## 2012-02-05 LAB — LACTIC ACID, PLASMA: Lactic Acid, Venous: 2.5 mmol/L — ABNORMAL HIGH (ref 0.5–2.2)

## 2012-02-05 MED ORDER — IOHEXOL 300 MG/ML  SOLN
100.0000 mL | Freq: Once | INTRAMUSCULAR | Status: AC | PRN
  Administered 2012-02-05: 100 mL via INTRAVENOUS

## 2012-02-05 MED ORDER — SODIUM CHLORIDE 0.9 % IV SOLN
INTRAVENOUS | Status: DC
  Administered 2012-02-05: 18:00:00 via INTRAVENOUS

## 2012-02-05 MED ORDER — HYDROCODONE-ACETAMINOPHEN 5-500 MG PO TABS: 1.0000 | ORAL_TABLET | Freq: Four times a day (QID) | ORAL | Status: AC | PRN

## 2012-02-05 MED ORDER — MORPHINE SULFATE 4 MG/ML IJ SOLN: 4.0000 mg | Freq: Once | INTRAMUSCULAR | Status: DC

## 2012-02-05 MED ORDER — IOHEXOL 300 MG/ML  SOLN
20.0000 mL | INTRAMUSCULAR | Status: AC
  Administered 2012-02-05: 20 mL via ORAL

## 2012-02-05 MED ORDER — MORPHINE SULFATE 4 MG/ML IJ SOLN
4.0000 mg | Freq: Once | INTRAMUSCULAR | Status: AC
  Administered 2012-02-05: 4 mg via INTRAVENOUS
  Filled 2012-02-05: qty 1

## 2012-02-05 MED ORDER — ONDANSETRON HCL 4 MG/2ML IJ SOLN
4.0000 mg | Freq: Once | INTRAMUSCULAR | Status: AC
  Administered 2012-02-05: 4 mg via INTRAVENOUS
  Filled 2012-02-05: qty 2

## 2012-02-05 MED ORDER — ONDANSETRON HCL 4 MG PO TABS: 4.0000 mg | ORAL_TABLET | Freq: Four times a day (QID) | ORAL | Status: DC

## 2012-02-05 NOTE — ED Notes (Signed)
Patient states abdominal pain LUQ and RUQ for 2 days continued today.  Patient states pain is intermittent.  Abdomen soft slight distended.  Denies n/v/d. Ax4 family members at bedside.

## 2012-02-05 NOTE — ED Notes (Signed)
LAB HAS BEEN CALLED ABOUT DELAY ON TROPONIN DRAW. THEY ADVISE THEY ARE RUNNING BEHIND BUT WILL GET BLOOD AS SOON AS THEY CAN

## 2012-02-05 NOTE — ED Notes (Signed)
Pt reports several day hx of bilateral upper abdominal pain. Pt points to lower rib cage on both sides. Denies sob, diaphoresis or nausea. Denies injury.

## 2012-02-05 NOTE — Patient Instructions (Signed)
Your [facility] [name] [name] will take you directly to come hospital

## 2012-02-05 NOTE — ED Notes (Signed)
Patient given oral contrast for CT by CT tech verbalized understanding.

## 2012-02-05 NOTE — ED Notes (Signed)
PT IS DRINKING HIS SECOND CUP OF CT PREP. TOLERATIING WELL. STATES HAVING PAIN TODAY UNDER HIS RIBS ON BOTH SIDES OF HIS ABDOMEN. STATES HE ALSO HAS NOT HAD A BM TODAY.

## 2012-02-05 NOTE — Progress Notes (Signed)
  Subjective:    Patient ID: [name] [name], male    DOB: 06/01/1925, 78 y.o.   MRN: [id number]  HPI [name] [name] is an 78 year old married male nonsmoker who comes in today accompanied by his [facility] person for evaluation of abdominal pain  He states he felt well until Tuesday around noon when he developed severe midepigastric abdominal pain. He describes the pain as constant dull a 6 on a scale of 1-10. The pain was so intense he was unable to sleep last night. He denies any fever nausea vomiting diarrhea urinary tract symptoms. He's never had any GI problems in the past. He states he's never had a colonoscopy either.     Review of Systems    general and GI review of systems otherwise negative Objective:   Physical Exam  Well-developed well-nourished male in acute pain  Examination the abdomen the abdomen was flat bowel sounds are normal he has tenderness in the midepigastric right upper quadrant left upper quadrant areas with no rebound and no masses. Rectal examination she was normal no impaction,,,,,,, guaiac negative      Assessment & Plan:  Acute abdominal pain referred to the hospital for further diagnostic evaluation

## 2012-02-05 NOTE — ED Provider Notes (Signed)
History     CSN: [id number]  Arrival date & time 02/05/12  1503   First MD Initiated Contact with Patient 02/05/12 1725      Chief Complaint  Patient presents with  . Abdominal Pain    (Consider location/radiation/quality/duration/timing/severity/associated sxs/prior treatment) Patient is a 78 y.o. male presenting with abdominal pain. The history is provided by the patient and a relative. No language interpreter was used.  Abdominal Pain The primary symptoms of the illness include abdominal pain, fatigue and nausea. The primary symptoms of the illness do not include fever, shortness of breath, vomiting, diarrhea or dysuria. The current episode started 2 days ago. The onset of the illness was gradual. The problem has been gradually worsening.  The abdominal pain is located in the RUQ, epigastric region and LUQ. The abdominal pain does not radiate. The abdominal pain is relieved by nothing. The abdominal pain is exacerbated by movement and certain positions.  The fatigue began 2 days ago. The fatigue has been unchanged since its onset.  Nausea began 2 days ago.  The patient states that she believes she is currently not pregnant. The patient has not had a change in bowel habit. Symptoms associated with the illness do not include chills, anorexia, diaphoresis, heartburn, constipation, urgency, frequency or back pain.    Past Medical History  Diagnosis Date  . Arrhythmia   . Carotid artery occlusion     right total internal artery occlusion  . Coronary artery disease     s/p cardiac cath in 1990s, and cardiolite study  in 2005 showing EF 54%  . Hyperlipidemia   . Hypertension   . TIA (transient ischemic attack)   . Atrial fibrillation   . Myocardial infarction 1992 and 1994    history of  . Degenerative disc disease     cervical spine    Past Surgical History  Procedure Date  . Cardiac catheterization 1990s  . Cataract extraction, bilateral     Family History  Problem  Relation Age of Onset  . Heart disease Mother   . Malignant hyperthermia Mother   . Heart disease Father   . Malignant hyperthermia Father     History  Substance Use Topics  . Smoking status: Former Smoker -- 1.0 packs/day for 40 years    Types: Cigarettes  . Smokeless tobacco: Former Neurosurgeon    Quit date: 11/26/1990   Comment: quit around 1992  . Alcohol Use: No     former, quit drinking in 1992      Review of Systems  Constitutional: Positive for fatigue. Negative for fever, chills, diaphoresis, activity change and appetite change.  HENT: Negative for congestion, sore throat, rhinorrhea, neck pain and neck stiffness.   Respiratory: Negative for cough and shortness of breath.   Cardiovascular: Negative for chest pain and palpitations.  Gastrointestinal: Positive for nausea and abdominal pain. Negative for heartburn, vomiting, diarrhea, constipation, blood in stool and anorexia.  Genitourinary: Negative for dysuria, urgency, frequency and flank pain.  Musculoskeletal: Negative for myalgias, back pain and arthralgias.  Neurological: Negative for dizziness, weakness, light-headedness, numbness and headaches.  All other systems reviewed and are negative.    Allergies  Levaquin  Home Medications   Current Outpatient Rx  Name Route Sig Dispense Refill  . ACETAMINOPHEN 325 MG PO TABS Oral Take 325 mg by mouth every 6 (six) hours as needed. For pain    . ASPIRIN 81 MG PO TABS Oral Take 81 mg by mouth at bedtime.     Marland Kitchen  ATORVASTATIN CALCIUM 10 MG PO TABS Oral Take 10 mg by mouth at bedtime.    Marland Kitchen CALTRATE 600 PLUS-VIT D PO Oral Take 1 tablet by mouth at bedtime.     . COLLAGENASE 250 UNIT/GM EX OINT Topical Apply 1 application topically daily.    Marland Kitchen DIGOXIN 0.125 MG PO TABS Oral Take 125 mcg by mouth daily.    . FUROSEMIDE 10 MG/ML PO SOLN  10 mg every evening. 10 mg at noon    . FUROSEMIDE 20 MG PO TABS Oral Take 20 mg by mouth daily.    Marland Kitchen HYDROCERIN EX CREA Topical Apply 1  application topically daily. Apply topically 2 (two) times daily. Apply to dry skin of bil. LE and feet, but not between toes    . HYDROCODONE-HOMATROPINE 5-1.5 MG/5ML PO SYRP Oral Take 2.5-5 mLs by mouth every 6 (six) hours as needed. for cough    . LEVOTHYROXINE SODIUM 75 MCG PO TABS Oral Take 75 mcg by mouth daily.    Marland Kitchen METHOTREXATE (ANTI-RHEUMATIC) 2.5 MG PO TABS Oral Take 10 mg by mouth once a week. Sunday    . METOPROLOL SUCCINATE ER 50 MG PO TB24 Oral Take 50 mg by mouth 2 (two) times daily.    . CENTRUM SILVER PO Oral Take 1 tablet by mouth every evening.     Marland Kitchen NIACIN ER (ANTIHYPERLIPIDEMIC) 1000 MG PO TBCR Oral Take 2,000 mg by mouth at bedtime.    Marland Kitchen PREDNISONE 20 MG PO TABS Oral Take 10 mg by mouth 3 (three) times a week. 1 tablet x5 days, a half tab x5 days, then a half a tablet Monday Wednesday Friday for a 3 week taper    . TRAMADOL HCL 50 MG PO TABS Oral Take 50 mg by mouth every 8 (eight) hours as needed. For pain    . WARFARIN SODIUM 5 MG PO TABS Oral Take 2.5-5 mg by mouth daily at 6 PM. Mondays, Wednesday, and Fridays only pt takes a half tab (2.5mg ) All other days 1 tab (5mg )    . HYDROCODONE-ACETAMINOPHEN 5-500 MG PO TABS Oral Take 1-2 tablets by mouth every 6 (six) hours as needed for pain. 15 tablet 0  . NITROGLYCERIN 0.4 MG SL SUBL Sublingual Place 0.4 mg under the tongue every 5 (five) minutes as needed. For chest pain    . ONDANSETRON HCL 4 MG PO TABS Oral Take 1 tablet (4 mg total) by mouth every 6 (six) hours. 12 tablet 0    BP 142/82  Pulse 84  Temp(Src) 97.5 F (36.4 C) (Oral)  Resp 18  SpO2 98%  Physical Exam  Nursing note and vitals reviewed. Constitutional: He is oriented to person, place, and time. He appears well-developed and well-nourished. No distress.  HENT:  Head: Normocephalic and atraumatic.  Mouth/Throat: Oropharynx is clear and moist.  Eyes: Conjunctivae and EOM are normal. Pupils are equal, round, and reactive to light.  Neck: Normal range of  motion. Neck supple.  Cardiovascular: Normal rate, normal heart sounds and intact distal pulses.  Exam reveals no gallop and no friction rub.   No murmur heard.      irreg irreg rhythm  Pulmonary/Chest: Effort normal and breath sounds normal. No respiratory distress. He exhibits no tenderness.  Abdominal: Soft. Bowel sounds are normal. There is tenderness (epigastrium, RUQ, LUQ). There is no rebound and no guarding.  Musculoskeletal: Normal range of motion. He exhibits no edema and no tenderness.  Neurological: He is alert and oriented to person, place, and  time.  Skin: Skin is warm and dry. No rash noted.    ED Course  Procedures (including critical care time)   Date: 02/05/2012  Rate: 87  Rhythm: atrial fibrillation  QRS Axis: normal  Intervals: normal  ST/T Wave abnormalities: nonspecific ST changes  Conduction Disutrbances:none  Narrative Interpretation:   Old EKG Reviewed: unchanged  Labs Reviewed  CBC - Abnormal; Notable for the following:    RBC 4.03 (*)    Hemoglobin 12.8 (*)    HCT 38.0 (*)    RDW 16.8 (*)    Platelets 134 (*)    All other components within normal limits  DIFFERENTIAL - Abnormal; Notable for the following:    Neutrophils Relative 84 (*)    Lymphocytes Relative 7 (*)    Lymphs Abs 0.6 (*)    All other components within normal limits  COMPREHENSIVE METABOLIC PANEL - Abnormal; Notable for the following:    Sodium 127 (*)    Chloride 93 (*)    Glucose, Bld 126 (*)    Albumin 3.2 (*)    AST 43 (*)    GFR calc non Af Amer 88 (*)    All other components within normal limits  LACTIC ACID, PLASMA - Abnormal; Notable for the following:    Lactic Acid, Venous 2.5 (*)    All other components within normal limits  DIGOXIN LEVEL - Abnormal; Notable for the following:    Digoxin Level 0.4 (*)    All other components within normal limits  LIPASE, BLOOD  URINALYSIS, ROUTINE W REFLEX MICROSCOPIC  POCT I-STAT TROPONIN I  DIGOXIN LEVEL   Dg Chest 2  View  02/05/2012  *RADIOLOGY REPORT*  Clinical Data: Chest and upper abdomen pain  CHEST - 2 VIEW  Comparison: Chest x-ray of 12/01/2011  Findings: No active infiltrate or effusion is seen.  The previously noted pleural effusions have resolved.  Mild cardiomegaly is stable.  There are degenerative changes throughout the mid to lower thoracic spine.  IMPRESSION: No active lung disease.  Resolution of previously noted effusions.  Original Report Authenticated By: Juline Patch, M.D.   Ct Abdomen Pelvis W Contrast  02/05/2012  *RADIOLOGY REPORT*  Clinical Data: Abdominal pain and distention.  CT ABDOMEN AND PELVIS WITH CONTRAST  Technique:  Multidetector CT imaging of the abdomen and pelvis was performed following the standard protocol during bolus administration of intravenous contrast.  Contrast: OMNIPAQUE IOHEXOL 300 MG/ML IJ SOLN  Comparison: None.  Findings: Probable tiny sub-centimeter cyst noted in the left hepatic lobe, however the abdominal parenchymal organs are otherwise unremarkable in appearance.  No soft tissue masses are identified.  Gallbladder is unremarkable.  No evidence of hydronephrosis.  No lymphadenopathy identified within the abdomen or pelvis.  Atherosclerotic calcification of the abdominal aorta and iliac arteries is seen, without evidence of aneurysm.  No evidence of inflammatory process or abnormal fluid collections.  No evidence of bowel wall thickening or dilatation.  IMPRESSION: No acute findings or other significant abnormality identified.  Original Report Authenticated By: Danae Orleans, M.D.     1. Abdominal pain   2. Nausea       MDM  Abdominal pain with a negative CAT scan. He does have some epigastric tenderness. This could be secondary to reflux type syndrome. Also be secondary to gastritis. The pain has been persistent for greater than 6 hours and a single negative troponin. For this adequately rules out ACS as it causes pain. He received IV fluids with  improvement of his symptoms. Antiemetics and pain medication also improved. He is able tolerate oral intake without significant change in his symptoms. He'll be discharged home with pain and nausea control instructed to followup with his primary care physician. he is provided strict return precautions        Dayton Bailiff, MD 02/05/12 2223

## 2012-02-06 ENCOUNTER — Inpatient Hospital Stay (HOSPITAL_COMMUNITY)
Admission: EM | Admit: 2012-02-06 | Discharge: 2012-02-10 | DRG: 392 | Disposition: A | Discharge status: Home Health | Payer: Medicare Other | Attending: Internal Medicine | Admitting: Internal Medicine

## 2012-02-06 DIAGNOSIS — R600 Localized edema: Secondary | ICD-10-CM

## 2012-02-06 DIAGNOSIS — Z79899 Other long term (current) drug therapy: Secondary | ICD-10-CM

## 2012-02-06 DIAGNOSIS — R531 Weakness: Secondary | ICD-10-CM

## 2012-02-06 DIAGNOSIS — M5030 Other cervical disc degeneration, unspecified cervical region: Secondary | ICD-10-CM | POA: Diagnosis present

## 2012-02-06 DIAGNOSIS — E871 Hypo-osmolality and hyponatremia: Secondary | ICD-10-CM | POA: Diagnosis not present

## 2012-02-06 DIAGNOSIS — R1013 Epigastric pain: Secondary | ICD-10-CM

## 2012-02-06 DIAGNOSIS — J4489 Other specified chronic obstructive pulmonary disease: Secondary | ICD-10-CM

## 2012-02-06 DIAGNOSIS — Z87891 Personal history of nicotine dependence: Secondary | ICD-10-CM

## 2012-02-06 DIAGNOSIS — I4891 Unspecified atrial fibrillation: Secondary | ICD-10-CM

## 2012-02-06 DIAGNOSIS — Z7901 Long term (current) use of anticoagulants: Secondary | ICD-10-CM

## 2012-02-06 DIAGNOSIS — R609 Edema, unspecified: Secondary | ICD-10-CM

## 2012-02-06 DIAGNOSIS — K5289 Other specified noninfective gastroenteritis and colitis: Principal | ICD-10-CM | POA: Diagnosis present

## 2012-02-06 DIAGNOSIS — T451X5A Adverse effect of antineoplastic and immunosuppressive drugs, initial encounter: Secondary | ICD-10-CM | POA: Diagnosis present

## 2012-02-06 DIAGNOSIS — Z7982 Long term (current) use of aspirin: Secondary | ICD-10-CM

## 2012-02-06 DIAGNOSIS — L270 Generalized skin eruption due to drugs and medicaments taken internally: Secondary | ICD-10-CM | POA: Diagnosis present

## 2012-02-06 DIAGNOSIS — E860 Dehydration: Secondary | ICD-10-CM

## 2012-02-06 DIAGNOSIS — D649 Anemia, unspecified: Secondary | ICD-10-CM

## 2012-02-06 DIAGNOSIS — R197 Diarrhea, unspecified: Secondary | ICD-10-CM | POA: Diagnosis not present

## 2012-02-06 DIAGNOSIS — M069 Rheumatoid arthritis, unspecified: Secondary | ICD-10-CM | POA: Diagnosis present

## 2012-02-06 DIAGNOSIS — I10 Essential (primary) hypertension: Secondary | ICD-10-CM

## 2012-02-06 DIAGNOSIS — J449 Chronic obstructive pulmonary disease, unspecified: Secondary | ICD-10-CM

## 2012-02-06 DIAGNOSIS — E2749 Other adrenocortical insufficiency: Secondary | ICD-10-CM | POA: Diagnosis present

## 2012-02-06 DIAGNOSIS — J45909 Unspecified asthma, uncomplicated: Secondary | ICD-10-CM | POA: Diagnosis present

## 2012-02-06 DIAGNOSIS — R109 Unspecified abdominal pain: Secondary | ICD-10-CM | POA: Diagnosis not present

## 2012-02-06 DIAGNOSIS — Z8679 Personal history of other diseases of the circulatory system: Secondary | ICD-10-CM

## 2012-02-06 DIAGNOSIS — E785 Hyperlipidemia, unspecified: Secondary | ICD-10-CM

## 2012-02-06 DIAGNOSIS — J309 Allergic rhinitis, unspecified: Secondary | ICD-10-CM

## 2012-02-06 DIAGNOSIS — E2740 Unspecified adrenocortical insufficiency: Secondary | ICD-10-CM

## 2012-02-06 DIAGNOSIS — I2510 Atherosclerotic heart disease of native coronary artery without angina pectoris: Secondary | ICD-10-CM

## 2012-02-06 DIAGNOSIS — E039 Hypothyroidism, unspecified: Secondary | ICD-10-CM | POA: Diagnosis present

## 2012-02-06 DIAGNOSIS — I6529 Occlusion and stenosis of unspecified carotid artery: Secondary | ICD-10-CM | POA: Diagnosis present

## 2012-02-06 DIAGNOSIS — D696 Thrombocytopenia, unspecified: Secondary | ICD-10-CM

## 2012-02-06 DIAGNOSIS — Z8673 Personal history of transient ischemic attack (TIA), and cerebral infarction without residual deficits: Secondary | ICD-10-CM

## 2012-02-06 DIAGNOSIS — I252 Old myocardial infarction: Secondary | ICD-10-CM

## 2012-02-06 HISTORY — DX: Urge incontinence: N39.41

## 2012-02-06 HISTORY — DX: Unspecified malignant neoplasm of skin, unspecified: C44.90

## 2012-02-06 HISTORY — DX: Gastro-esophageal reflux disease without esophagitis: K21.9

## 2012-02-06 NOTE — ED Notes (Signed)
The pt has had upper abd pain for 2-3 days with  Nausea.  He was seen here for the same last pm

## 2012-02-07 ENCOUNTER — Inpatient Hospital Stay (HOSPITAL_COMMUNITY): Payer: Medicare Other

## 2012-02-07 ENCOUNTER — Other Ambulatory Visit: Payer: Self-pay

## 2012-02-07 ENCOUNTER — Encounter (HOSPITAL_COMMUNITY): Payer: Self-pay | Admitting: *Deleted

## 2012-02-07 DIAGNOSIS — E785 Hyperlipidemia, unspecified: Secondary | ICD-10-CM | POA: Diagnosis present

## 2012-02-07 DIAGNOSIS — I6529 Occlusion and stenosis of unspecified carotid artery: Secondary | ICD-10-CM | POA: Diagnosis not present

## 2012-02-07 DIAGNOSIS — Z87891 Personal history of nicotine dependence: Secondary | ICD-10-CM | POA: Diagnosis not present

## 2012-02-07 DIAGNOSIS — E2749 Other adrenocortical insufficiency: Secondary | ICD-10-CM | POA: Diagnosis not present

## 2012-02-07 DIAGNOSIS — I4891 Unspecified atrial fibrillation: Secondary | ICD-10-CM | POA: Diagnosis present

## 2012-02-07 DIAGNOSIS — G319 Degenerative disease of nervous system, unspecified: Secondary | ICD-10-CM | POA: Diagnosis not present

## 2012-02-07 DIAGNOSIS — R5381 Other malaise: Secondary | ICD-10-CM | POA: Diagnosis not present

## 2012-02-07 DIAGNOSIS — J45909 Unspecified asthma, uncomplicated: Secondary | ICD-10-CM | POA: Diagnosis present

## 2012-02-07 DIAGNOSIS — E871 Hypo-osmolality and hyponatremia: Secondary | ICD-10-CM | POA: Diagnosis present

## 2012-02-07 DIAGNOSIS — D696 Thrombocytopenia, unspecified: Secondary | ICD-10-CM

## 2012-02-07 DIAGNOSIS — M069 Rheumatoid arthritis, unspecified: Secondary | ICD-10-CM | POA: Diagnosis present

## 2012-02-07 DIAGNOSIS — Z7901 Long term (current) use of anticoagulants: Secondary | ICD-10-CM | POA: Diagnosis not present

## 2012-02-07 DIAGNOSIS — Z79899 Other long term (current) drug therapy: Secondary | ICD-10-CM | POA: Diagnosis not present

## 2012-02-07 DIAGNOSIS — I10 Essential (primary) hypertension: Secondary | ICD-10-CM | POA: Diagnosis present

## 2012-02-07 DIAGNOSIS — E860 Dehydration: Secondary | ICD-10-CM

## 2012-02-07 DIAGNOSIS — Z7982 Long term (current) use of aspirin: Secondary | ICD-10-CM | POA: Diagnosis not present

## 2012-02-07 DIAGNOSIS — Z8673 Personal history of transient ischemic attack (TIA), and cerebral infarction without residual deficits: Secondary | ICD-10-CM | POA: Diagnosis not present

## 2012-02-07 DIAGNOSIS — R197 Diarrhea, unspecified: Secondary | ICD-10-CM | POA: Diagnosis not present

## 2012-02-07 DIAGNOSIS — R109 Unspecified abdominal pain: Secondary | ICD-10-CM | POA: Diagnosis not present

## 2012-02-07 DIAGNOSIS — I2510 Atherosclerotic heart disease of native coronary artery without angina pectoris: Secondary | ICD-10-CM | POA: Diagnosis present

## 2012-02-07 DIAGNOSIS — L270 Generalized skin eruption due to drugs and medicaments taken internally: Secondary | ICD-10-CM | POA: Diagnosis present

## 2012-02-07 DIAGNOSIS — I252 Old myocardial infarction: Secondary | ICD-10-CM | POA: Diagnosis not present

## 2012-02-07 DIAGNOSIS — D649 Anemia, unspecified: Secondary | ICD-10-CM | POA: Diagnosis present

## 2012-02-07 DIAGNOSIS — K5289 Other specified noninfective gastroenteritis and colitis: Secondary | ICD-10-CM | POA: Diagnosis present

## 2012-02-07 DIAGNOSIS — R531 Weakness: Secondary | ICD-10-CM | POA: Diagnosis present

## 2012-02-07 DIAGNOSIS — M5030 Other cervical disc degeneration, unspecified cervical region: Secondary | ICD-10-CM | POA: Diagnosis present

## 2012-02-07 DIAGNOSIS — I6350 Cerebral infarction due to unspecified occlusion or stenosis of unspecified cerebral artery: Secondary | ICD-10-CM | POA: Diagnosis not present

## 2012-02-07 DIAGNOSIS — E039 Hypothyroidism, unspecified: Secondary | ICD-10-CM | POA: Diagnosis present

## 2012-02-07 LAB — CBC
HCT: 37.7 % — ABNORMAL LOW (ref 39.0–52.0)
Hemoglobin: 13.1 g/dL (ref 13.0–17.0)
Hemoglobin: 12.5 g/dL — ABNORMAL LOW (ref 13.0–17.0)
MCH: 31.8 pg (ref 26.0–34.0)
MCV: 93.1 fL (ref 78.0–100.0)
MCV: 93.9 fL (ref 78.0–100.0)
Platelets: 115 10*3/uL — ABNORMAL LOW (ref 150–400)
RBC: 4.05 MIL/uL — ABNORMAL LOW (ref 4.22–5.81)
RBC: 3.93 MIL/uL — ABNORMAL LOW (ref 4.22–5.81)
WBC: 9.3 10*3/uL (ref 4.0–10.5)

## 2012-02-07 LAB — COMPREHENSIVE METABOLIC PANEL
ALT: 44 U/L (ref 0–53)
Albumin: 2.9 g/dL — ABNORMAL LOW (ref 3.5–5.2)
Alkaline Phosphatase: 58 U/L (ref 39–117)
GFR calc Af Amer: >90 mL/min (ref >90)
Glucose, Bld: 111 mg/dL — ABNORMAL HIGH (ref 70–99)
Potassium: 3.4 mEq/L — ABNORMAL LOW (ref 3.5–5.1)
Sodium: 127 mEq/L — ABNORMAL LOW (ref 135–145)
Total Protein: 5.8 g/dL — ABNORMAL LOW (ref 6.0–8.3)

## 2012-02-07 LAB — BASIC METABOLIC PANEL
BUN: 17 mg/dL (ref 6–23)
CO2: 25 mEq/L (ref 19–32)
Calcium: 8.2 mg/dL — ABNORMAL LOW (ref 8.4–10.5)
Glucose, Bld: 87 mg/dL (ref 70–99)
Potassium: 4.1 mEq/L (ref 3.5–5.1)
Sodium: 131 mEq/L — ABNORMAL LOW (ref 135–145)

## 2012-02-07 LAB — DIFFERENTIAL
Eosinophils Relative: 0 % (ref 0–5)
Lymphs Abs: 0.7 10*3/uL (ref 0.7–4.0)
Monocytes Relative: 15 % — ABNORMAL HIGH (ref 3–12)
Neutro Abs: 7.1 10*3/uL (ref 1.7–7.7)

## 2012-02-07 LAB — TSH: TSH: 9.388 u[IU]/mL — ABNORMAL HIGH (ref 0.350–4.500)

## 2012-02-07 LAB — CARDIAC PANEL(CRET KIN+CKTOT+MB+TROPI): Total CK: 39 U/L (ref 7–232)

## 2012-02-07 LAB — PROTIME-INR: INR: 1.76 — ABNORMAL HIGH (ref 0.00–1.49)

## 2012-02-07 LAB — CLOSTRIDIUM DIFFICILE BY PCR: Toxigenic C. Difficile by PCR: NEGATIVE

## 2012-02-07 MED ORDER — SODIUM CHLORIDE 0.9 % IV SOLN
INTRAVENOUS | Status: DC
  Administered 2012-02-07 – 2012-02-08 (×4): via INTRAVENOUS
  Administered 2012-02-08: 1000 mL via INTRAVENOUS
  Administered 2012-02-09: 06:00:00 via INTRAVENOUS

## 2012-02-07 MED ORDER — METOPROLOL TARTRATE 1 MG/ML IV SOLN: 5.0000 mg | Freq: Four times a day (QID) | INTRAVENOUS | Status: DC | PRN

## 2012-02-07 MED ORDER — POTASSIUM CHLORIDE 10 MEQ/100ML IV SOLN
10.0000 meq | INTRAVENOUS | Status: AC
  Administered 2012-02-07 (×2): 10 meq via INTRAVENOUS
  Filled 2012-02-07 (×3): qty 100

## 2012-02-07 MED ORDER — SODIUM CHLORIDE 0.9 % IV BOLUS (SEPSIS)
500.0000 mL | Freq: Once | INTRAVENOUS | Status: AC
  Administered 2012-02-07: 500 mL via INTRAVENOUS

## 2012-02-07 MED ORDER — SODIUM CHLORIDE 0.9 % IV SOLN: INTRAVENOUS | Status: DC

## 2012-02-07 MED ORDER — ALUM & MAG HYDROXIDE-SIMETH 200-200-20 MG/5ML PO SUSP
30.0000 mL | Freq: Four times a day (QID) | ORAL | Status: DC | PRN
  Administered 2012-02-08 – 2012-02-09 (×2): 30 mL via ORAL
  Filled 2012-02-07 (×2): qty 30

## 2012-02-07 MED ORDER — ACETAMINOPHEN 325 MG PO TABS: 650.0000 mg | ORAL_TABLET | Freq: Four times a day (QID) | ORAL | Status: DC | PRN

## 2012-02-07 MED ORDER — ONDANSETRON HCL 4 MG PO TABS: 4.0000 mg | ORAL_TABLET | Freq: Four times a day (QID) | ORAL | Status: DC | PRN

## 2012-02-07 MED ORDER — ONDANSETRON HCL 4 MG/2ML IJ SOLN: 4.0000 mg | Freq: Four times a day (QID) | INTRAMUSCULAR | Status: DC | PRN

## 2012-02-07 MED ORDER — HYDROMORPHONE HCL PF 1 MG/ML IJ SOLN
0.5000 mg | INTRAMUSCULAR | Status: DC | PRN
  Administered 2012-02-07: 1 mg via INTRAVENOUS
  Filled 2012-02-07: qty 1

## 2012-02-07 MED ORDER — ACETAMINOPHEN 650 MG RE SUPP: 650.0000 mg | Freq: Four times a day (QID) | RECTAL | Status: DC | PRN

## 2012-02-07 MED ORDER — ATORVASTATIN CALCIUM 10 MG PO TABS
10.0000 mg | ORAL_TABLET | Freq: Every day | ORAL | Status: DC
  Administered 2012-02-07 – 2012-02-09 (×3): 10 mg via ORAL
  Filled 2012-02-07 (×4): qty 1

## 2012-02-07 MED ORDER — WARFARIN - PHARMACIST DOSING INPATIENT: Freq: Every day | Status: DC

## 2012-02-07 MED ORDER — DICYCLOMINE HCL 10 MG PO CAPS
10.0000 mg | ORAL_CAPSULE | Freq: Four times a day (QID) | ORAL | Status: DC | PRN
  Administered 2012-02-09 (×2): 10 mg via ORAL
  Filled 2012-02-07 (×3): qty 1

## 2012-02-07 MED ORDER — WARFARIN SODIUM 6 MG PO TABS
6.0000 mg | ORAL_TABLET | Freq: Once | ORAL | Status: AC
  Administered 2012-02-07: 6 mg via ORAL
  Filled 2012-02-07: qty 1

## 2012-02-07 MED ORDER — MORPHINE SULFATE 2 MG/ML IJ SOLN: 2.0000 mg | INTRAMUSCULAR | Status: DC | PRN

## 2012-02-07 MED ORDER — LEVOTHYROXINE SODIUM 75 MCG PO TABS
75.0000 ug | ORAL_TABLET | Freq: Every day | ORAL | Status: DC
  Administered 2012-02-07 – 2012-02-08 (×2): 75 ug via ORAL
  Filled 2012-02-07 (×2): qty 1

## 2012-02-07 MED ORDER — ACETAMINOPHEN 325 MG PO TABS: 325.0000 mg | ORAL_TABLET | Freq: Four times a day (QID) | ORAL | Status: DC | PRN

## 2012-02-07 MED ORDER — ZOLPIDEM TARTRATE 5 MG PO TABS: 5.0000 mg | ORAL_TABLET | Freq: Every evening | ORAL | Status: DC | PRN

## 2012-02-07 MED ORDER — MORPHINE SULFATE 4 MG/ML IJ SOLN
4.0000 mg | Freq: Once | INTRAMUSCULAR | Status: AC
  Administered 2012-02-07: 4 mg via INTRAVENOUS
  Filled 2012-02-07: qty 1

## 2012-02-07 MED ORDER — HYDROCORTISONE SOD SUCCINATE 100 MG IJ SOLR
50.0000 mg | Freq: Four times a day (QID) | INTRAMUSCULAR | Status: DC
  Administered 2012-02-07: 50 mg via INTRAVENOUS
  Administered 2012-02-07: 14:00:00 via INTRAVENOUS
  Administered 2012-02-08 (×3): 50 mg via INTRAVENOUS
  Filled 2012-02-07 (×8): qty 1

## 2012-02-07 MED ORDER — DIGOXIN 125 MCG PO TABS
125.0000 ug | ORAL_TABLET | Freq: Every day | ORAL | Status: DC
  Administered 2012-02-07 – 2012-02-10 (×4): 125 ug via ORAL
  Filled 2012-02-07 (×4): qty 1

## 2012-02-07 MED ORDER — OXYCODONE HCL 5 MG PO TABS
5.0000 mg | ORAL_TABLET | ORAL | Status: DC | PRN
  Administered 2012-02-10 (×2): 5 mg via ORAL
  Filled 2012-02-07 (×2): qty 1

## 2012-02-07 NOTE — Progress Notes (Signed)
78 yo with PMH sig CAD, AFIB and HTN admitted overnight with weakness, diarrhea. His in home caregiver from [employer] found him lying in his stool and urine, too weak to get himself to the bathroom. IN ED he was found to be dehydrated, in A-Fib, and continuing to have loose stools. He had normal abdominal w/u on 3/14 in ED but has not improved since then. Also reports that he fell at home-he has a bandage on his forehead and a skin lac.  Differential broad, colitis favored but CT non-diagnostic of bowel wall thickening or inflammation.  1. Diarrhea/Abd Pain 2. Dehydration/Weakness 3. Hyponatremia 4. Thombocytopenia 5. Mild elevation in AST 6. Fall at home, Head laceration 7. Has Asthma on steroids in past frequently, so will need stress dose steroids 8. Rheumatoid Arthritis on Methotrexate 9. Papular rash few days 10 Hypothyroidism   Plan: 1. Continue hydration with NS EF 45% so mildly reduced SF 2. Stool Studies pending 3. Check norovirus PCR 4. Will consider abdomin al [place] if continue to have problems, follow LFT 5. Replete K 6. Repeat Check UA 7. Will check non contrasted CT of his head since he fell at home and had a head laceration. 8. Bentyl for GI spasm, Zofran, for nausea 9. Will check cardiac panel X1 10. Will rate control his A-Fib with B-B and CCB if needed. 11. Stop all non-essential home meds 12. Stress dose steroids-stop methtrexate 13. check TSH 14. resume coumadin once CT head is clear, Hb is ok, check INR   Anderson Malta, DO Internal Medicine [phone]

## 2012-02-07 NOTE — ED Notes (Signed)
Patient complaining of abdominal pain, weakness, and nausea for the last two to three days; denies diarrhea and vomiting.  Patient was incontinent of both stool and urine this morning (per caregiver); this is not normal for patient.  Patient was seen here last night for the same symptoms -- CT scan done.  Describes location of abdominal pain as left and right upper quadrant; patient rates pain 6/10 on the numerical pain scale.  Upon assessment, patient has rash all over body (red bumps).  Patient has cancer spots on face (covered by band-aid); family member reports that the bumpy rash all over body is new.  Patient alert and oriented x4; PERRL present.  Upon arrival to room, patient changed into gown and connected to continuous cardiac, pulse ox, and blood pressure monitor.  Will continue to monitor.

## 2012-02-07 NOTE — ED Notes (Signed)
Patient currently resting quietly in bed; no respiratory or acute distress noted.  Patient updated on plan of care; informed patient that a bed request has been put in and that we are currently waiting on a room assignment.  Patient has no other questions or concerns at this time; will continue to monitor.

## 2012-02-07 NOTE — ED Notes (Signed)
Dr. Bebe Shaggy and IV team at bedside.

## 2012-02-07 NOTE — H&P (Signed)
DATE OF ADMISSION:  02/07/2012  PCP:    Evette Georges, MD, MD   Chief Complaint: Diarrhea   HPI: [name] [name] is an 78 y.o. male who presents to the ED with complaints of diarrhea and increased weakness. He also has complaints of intermittent Chest and rib pain.  He denies SOB, or pleuritic chest pain.   He also report having a rash on his trunk and arms, denies and pain or pruritis form the rash. He was just recently hospitalized.  He was evaluated in the ED and found to have a sodium level of 127, and he was referred for admission.    Past Medical History  Diagnosis Date  . Arrhythmia   . Carotid artery occlusion     right total internal artery occlusion  . Coronary artery disease     s/p cardiac cath in 1990s, and cardiolite study  in 2005 showing EF 54%  . Hyperlipidemia   . Hypertension   . TIA (transient ischemic attack)   . Atrial fibrillation   . Myocardial infarction 1992 and 1994    history of  . Degenerative disc disease     cervical spine    Past Surgical History  Procedure Date  . Cardiac catheterization 1990s  . Cataract extraction, bilateral     Medications:  HOME MEDS: Prior to Admission medications   Medication Sig Start Date End Date Taking? Authorizing Provider  acetaminophen (TYLENOL) 325 MG tablet Take 325 mg by mouth every 6 (six) hours as needed. For pain   Yes Historical Provider, MD  aspirin 81 MG tablet Take 81 mg by mouth at bedtime.    Yes Historical Provider, MD  atorvastatin (LIPITOR) 10 MG tablet Take 10 mg by mouth at bedtime. 10/19/11 10/18/12 Yes Gaylord Shih, MD  Calcium-Vitamin D (CALTRATE 600 PLUS-VIT D PO) Take 1 tablet by mouth at bedtime.    Yes Historical Provider, MD  collagenase (SANTYL) ointment Apply 1 application topically daily.   Yes Historical Provider, MD  digoxin (LANOXIN) 0.125 MG tablet Take 125 mcg by mouth daily. 04/25/11  Yes Gaylord Shih, MD  furosemide (LASIX) 10 MG/ML solution Take 10 mg by mouth daily.  10 mg at noon 12/29/11  Yes Roderick Pee, MD  hydrocerin (EUCERIN) CREA Apply 1 application topically daily. Apply topically 2 (two) times daily. Apply to dry skin of bil. LE and feet, but not between toes 12/03/11  Yes Standley Brooking, MD  HYDROcodone-acetaminophen (VICODIN) 5-500 MG per tablet Take 1-2 tablets by mouth every 6 (six) hours as needed for pain. 02/05/12 02/15/12 Yes Dayton Bailiff, MD  HYDROcodone-homatropine [facility]) 5-1.5 MG/5ML syrup Take 2.5-5 mLs by mouth every 6 (six) hours as needed. for cough 01/26/12  Yes Roderick Pee, MD  levothyroxine (SYNTHROID, LEVOTHROID) 75 MCG tablet Take 75 mcg by mouth daily.   Yes Historical Provider, MD  methotrexate (RHEUMATREX) 2.5 MG tablet Take 10 mg by mouth once a week. Sunday   Yes Historical Provider, MD  metoprolol succinate (TOPROL-XL) 50 MG 24 hr tablet Take 50 mg by mouth 2 (two) times daily. 11/11/11  Yes Gaylord Shih, MD  Multiple Vitamins-Minerals (CENTRUM SILVER PO) Take 1 tablet by mouth every evening.    Yes Historical Provider, MD  niacin (NIASPAN) 1000 MG CR tablet Take 2,000 mg by mouth at bedtime. 01/16/12  Yes Gaylord Shih, MD  ondansetron (ZOFRAN) 4 MG tablet Take 1 tablet (4 mg total) by mouth every 6 (six) hours. 02/05/12 02/12/12  Yes Dayton Bailiff, MD  predniSONE (DELTASONE) 20 MG tablet Take 10 mg by mouth 3 (three) times a week. 1 tablet x5 days, a half tab x5 days, then a half a tablet Monday Wednesday Friday for a 3 week taper 01/26/12  Yes Roderick Pee, MD  traMADol (ULTRAM) 50 MG tablet Take 50 mg by mouth every 8 (eight) hours as needed. For pain   Yes Historical Provider, MD  warfarin (COUMADIN) 5 MG tablet Take 2.5-5 mg by mouth daily at 6 PM. Mondays, Wednesday, and Fridays only pt takes a half tab (2.5mg ) All other days 1 tab (5mg ) 07/10/11  Yes Gaylord Shih, MD  nitroGLYCERIN (NITROSTAT) 0.4 MG SL tablet Place 0.4 mg under the tongue every 5 (five) minutes as needed. For chest pain 02/24/11   Gaylord Shih, MD     Allergies:  Allergies  Allergen Reactions  . Levaquin     Rash 1-13    Social History:   reports that he has quit smoking. His smoking use included Cigarettes. He has a 40 pack-year smoking history. He quit smokeless tobacco use about 21 years ago. He reports that he does not drink alcohol or use illicit drugs.  Family History: Family History  Problem Relation Age of Onset  . Heart disease Mother   . Malignant hyperthermia Mother   . Heart disease Father   . Malignant hyperthermia Father     Review of Systems:  The patient denies anorexia, fever, weight loss,, vision loss, decreased hearing, hoarseness, chest pain, syncope, dyspnea on exertion, peripheral edema, balance deficits, hemoptysis, abdominal pain, melena, hematochezia, severe indigestion/heartburn, hematuria, incontinence, genital sores, muscle weakness, suspicious skin lesions, transient blindness, difficulty walking, depression, unusual weight change, abnormal bleeding, enlarged lymph nodes, angioedema, and breast masses.   Physical Exam:  GEN:  Pleasant examined  and in no acute distress; cooperative with exam Filed Vitals:   02/07/12 0315 02/07/12 0330 02/07/12 0350 02/07/12 0719  BP: 141/78 139/76 151/84 150/8  Pulse: 98 97 99 95  Temp:   98.3 F (36.8 C) 98.6 F (37 C)  TempSrc:      Resp: 18 17 18 18   SpO2: 95% 96% 98% 99%   Blood pressure 150/8, pulse 95, temperature 98.6 F (37 C), temperature source Oral, resp. rate 18, SpO2 99.00%. PSYCH: He is alert and oriented x4; does not appear anxious does not appear depressed; affect is normal HEENT: Normocephalic and Atraumatic, Mucous membranes pink; PERRLA; EOM intact; Fundi:  Benign;  No scleral icterus, Nares: Patent, Oropharynx: Clear, Fair Dentition, Neck:  FROM, no cervical lymphadenopathy nor thyromegaly or carotid bruit; no JVD; Breasts:: Not examined CHEST WALL: No tenderness CHEST: Normal respiration, clear to auscultation bilaterally HEART:  Regular rate and rhythm; no murmurs rubs or gallops BACK: No kyphosis or scoliosis; no CVA tenderness ABDOMEN: Positive Bowel Sounds, Obese, soft non-tender; no masses, no organomegaly. Rectal Exam: Not done EXTREMITIES: No bone or joint deformity; age-appropriate arthropathy of the hands and knees; no cyanosis, clubbing or edema; no ulcerations. Genitalia: not examined PULSES: 2+ and symmetric SKIN: Diffuse discrete papules on Trunk, and Both Upper Extremities,  Normal hydration no rash or ulceration CNS: Cranial nerves 2-12 grossly intact no focal neurologic deficit   Labs & Imaging Results for orders placed during the hospital encounter of 02/06/12 (from the past 48 hour(s))  CBC     Status: Abnormal   Collection Time   02/07/12 12:50 AM      Component Value Range Comment  WBC 9.3  4.0 - 10.5 (K/uL)    RBC 4.05 (*) 4.22 - 5.81 (MIL/uL)    Hemoglobin 13.1  13.0 - 17.0 (g/dL)    HCT 16.1 (*) 09.6 - 52.0 (%)    MCV 93.1  78.0 - 100.0 (fL)    MCH 32.3  26.0 - 34.0 (pg)    MCHC 34.7  30.0 - 36.0 (g/dL)    RDW 04.5 (*) 40.9 - 15.5 (%)    Platelets 115 (*) 150 - 400 (K/uL) PLATELET COUNT CONFIRMED BY SMEAR  DIFFERENTIAL     Status: Abnormal   Collection Time   02/07/12 12:50 AM      Component Value Range Comment   Neutrophils Relative 77  43 - 77 (%)    Lymphocytes Relative 7 (*) 12 - 46 (%)    Monocytes Relative 15 (*) 3 - 12 (%)    Eosinophils Relative 0  0 - 5 (%)    Basophils Relative 1  0 - 1 (%)    Neutro Abs 7.1  1.7 - 7.7 (K/uL)    Lymphs Abs 0.7  0.7 - 4.0 (K/uL)    Monocytes Absolute 1.4 (*) 0.1 - 1.0 (K/uL)    Eosinophils Absolute 0.0  0.0 - 0.7 (K/uL)    Basophils Absolute 0.1  0.0 - 0.1 (K/uL)    RBC Morphology TEARDROP CELLS      WBC Morphology ATYPICAL LYMPHS   RARE  LIPASE, BLOOD     Status: Normal   Collection Time   02/07/12 12:50 AM      Component Value Range Comment   Lipase 37  11 - 59 (U/L)   COMPREHENSIVE METABOLIC PANEL     Status: Abnormal    Collection Time   02/07/12 12:50 AM      Component Value Range Comment   Sodium 127 (*) 135 - 145 (mEq/L)    Potassium 3.4 (*) 3.5 - 5.1 (mEq/L)    Chloride 91 (*) 96 - 112 (mEq/L)    CO2 26  19 - 32 (mEq/L)    Glucose, Bld 111 (*) 70 - 99 (mg/dL)    BUN 14  6 - 23 (mg/dL)    Creatinine, Ser 8.11  0.50 - 1.35 (mg/dL)    Calcium 8.3 (*) 8.4 - 10.5 (mg/dL)    Total Protein 5.8 (*) 6.0 - 8.3 (g/dL)    Albumin 2.9 (*) 3.5 - 5.2 (g/dL)    AST 63 (*) 0 - 37 (U/L)    ALT 44  0 - 53 (U/L)    Alkaline Phosphatase 58  39 - 117 (U/L)    Total Bilirubin 0.8  0.3 - 1.2 (mg/dL)    GFR calc non Af Amer 82 (*) >90 (mL/min)    GFR calc Af Amer >90  >90 (mL/min)   DIGOXIN LEVEL     Status: Abnormal   Collection Time   02/07/12 12:50 AM      Component Value Range Comment   Digoxin Level 0.4 (*) 0.8 - 2.0 (ng/mL)   PROTIME-INR     Status: Abnormal   Collection Time   02/07/12 12:50 AM      Component Value Range Comment   Prothrombin Time 20.8 (*) 11.6 - 15.2 (seconds)    INR 1.76 (*) 0.00 - 1.49    LACTIC ACID, PLASMA     Status: Normal   Collection Time   02/07/12  1:00 AM      Component Value Range Comment   Lactic Acid, Venous 1.8  0.5 - 2.2 (mmol/L)    Dg Chest 2 View  02/05/2012  *RADIOLOGY REPORT*  Clinical Data: Chest and upper abdomen pain  CHEST - 2 VIEW  Comparison: Chest x-ray of 12/01/2011  Findings: No active infiltrate or effusion is seen.  The previously noted pleural effusions have resolved.  Mild cardiomegaly is stable.  There are degenerative changes throughout the mid to lower thoracic spine.  IMPRESSION: No active lung disease.  Resolution of previously noted effusions.  Original Report Authenticated By: Juline Patch, M.D.   Ct Abdomen Pelvis W Contrast  02/05/2012  *RADIOLOGY REPORT*  Clinical Data: Abdominal pain and distention.  CT ABDOMEN AND PELVIS WITH CONTRAST  Technique:  Multidetector CT imaging of the abdomen and pelvis was performed following the standard protocol  during bolus administration of intravenous contrast.  Contrast: OMNIPAQUE IOHEXOL 300 MG/ML IJ SOLN  Comparison: None.  Findings: Probable tiny sub-centimeter cyst noted in the left hepatic lobe, however the abdominal parenchymal organs are otherwise unremarkable in appearance.  No soft tissue masses are identified.  Gallbladder is unremarkable.  No evidence of hydronephrosis.  No lymphadenopathy identified within the abdomen or pelvis.  Atherosclerotic calcification of the abdominal aorta and iliac arteries is seen, without evidence of aneurysm.  No evidence of inflammatory process or abnormal fluid collections.  No evidence of bowel wall thickening or dilatation.  IMPRESSION: No acute findings or other significant abnormality identified.  Original Report Authenticated By: Danae Orleans, M.D.      Assessment/Plan: Present on Admission:  .Diarrhea .Weakness generalized .Hyponatremia .HYPERTENSION .CORONARY ARTERY DISEASE .HYPERLIPIDEMIA .Atrial fibrillation .Anemia   Plan:    Admitted to Telemetry Bed Cardiac Enzymes Enteric Precautions Check Stool studies , and C.Diff PCR. Gentle IVFs  May Need Dermatology consultation. DVT prophylaxis.   Reconcile home medications.   Other plans as per orders.    CODE STATUS:      FULL CODE        Eithen Castiglia C 02/07/2012, 10:24 AM

## 2012-02-07 NOTE — ED Notes (Signed)
Patient currently asleep in bed; no respiratory or acute distress noted.  Family present at bedside; will continue to monitor. 

## 2012-02-07 NOTE — ED Notes (Signed)
Attempted IV access x2; unsuccessful.  Clydie Braun, RN attempted IV access x2; unsuccessful.  IV team paged and states that they are on their way.

## 2012-02-07 NOTE — ED Notes (Signed)
Asked Dr. Bebe Shaggy about admitting MD status; Dr. Bebe Shaggy states to send patient up to room and that admitting MD can see patient on floor.  Preparing patient for transport; Patient being transported upstairs by nurse tele tech.

## 2012-02-07 NOTE — ED Notes (Signed)
Gave report to [place], RN on 5000.  No further questions/concerns from RN.  Informed RN that temporary admission orders are in the computer and that she can call with any questions/concerns once patient arrives to floor.

## 2012-02-07 NOTE — ED Provider Notes (Signed)
History     CSN: [id number]  Arrival date & time 02/06/12  2346   First MD Initiated Contact with Patient 02/07/12 0024      Chief Complaint  Patient presents with  . Abdominal Pain     Patient is a 78 y.o. male presenting with abdominal pain. The history is provided by the patient and a relative.  Abdominal Pain The primary symptoms of the illness include abdominal pain, fatigue, nausea and diarrhea. The primary symptoms of the illness do not include fever, shortness of breath or vomiting. The current episode started more than 2 days ago. The onset of the illness was gradual. The problem has been gradually worsening.  Additional symptoms associated with the illness include chills.  nothing is improving his symptoms Nothing worsens his symptoms  Pt with recent abd pain, now having diarrhea Also noted to have rash to abdomen that is new in past 24 hours No cp/sob No rectal bleeding is reported   Past Medical History  Diagnosis Date  . Arrhythmia   . Carotid artery occlusion     right total internal artery occlusion  . Coronary artery disease     s/p cardiac cath in 1990s, and cardiolite study  in 2005 showing EF 54%  . Hyperlipidemia   . Hypertension   . TIA (transient ischemic attack)   . Atrial fibrillation   . Myocardial infarction 1992 and 1994    history of  . Degenerative disc disease     cervical spine    Past Surgical History  Procedure Date  . Cardiac catheterization 1990s  . Cataract extraction, bilateral     Family History  Problem Relation Age of Onset  . Heart disease Mother   . Malignant hyperthermia Mother   . Heart disease Father   . Malignant hyperthermia Father     History  Substance Use Topics  . Smoking status: Former Smoker -- 1.0 packs/day for 40 years    Types: Cigarettes  . Smokeless tobacco: Former Neurosurgeon    Quit date: 11/26/1990   Comment: quit around 1992  . Alcohol Use: No     former, quit drinking in 1992      Review of  Systems  Constitutional: Positive for chills and fatigue. Negative for fever.  Respiratory: Negative for shortness of breath.   Gastrointestinal: Positive for nausea, abdominal pain and diarrhea. Negative for vomiting.  All other systems reviewed and are negative.    Allergies  Levaquin  Home Medications   Current Outpatient Rx  Name Route Sig Dispense Refill  . ACETAMINOPHEN 325 MG PO TABS Oral Take 325 mg by mouth every 6 (six) hours as needed. For pain    . ASPIRIN 81 MG PO TABS Oral Take 81 mg by mouth at bedtime.     . ATORVASTATIN CALCIUM 10 MG PO TABS Oral Take 10 mg by mouth at bedtime.    Marland Kitchen CALTRATE 600 PLUS-VIT D PO Oral Take 1 tablet by mouth at bedtime.     . COLLAGENASE 250 UNIT/GM EX OINT Topical Apply 1 application topically daily.    Marland Kitchen DIGOXIN 0.125 MG PO TABS Oral Take 125 mcg by mouth daily.    . FUROSEMIDE 10 MG/ML PO SOLN Oral Take 10 mg by mouth daily. 10 mg at noon    . HYDROCERIN EX CREA Topical Apply 1 application topically daily. Apply topically 2 (two) times daily. Apply to dry skin of bil. LE and feet, but not between toes    . HYDROCODONE-ACETAMINOPHEN  5-500 MG PO TABS Oral Take 1-2 tablets by mouth every 6 (six) hours as needed for pain. 15 tablet 0  . HYDROCODONE-HOMATROPINE 5-1.5 MG/5ML PO SYRP Oral Take 2.5-5 mLs by mouth every 6 (six) hours as needed. for cough    . LEVOTHYROXINE SODIUM 75 MCG PO TABS Oral Take 75 mcg by mouth daily.    Marland Kitchen METHOTREXATE (ANTI-RHEUMATIC) 2.5 MG PO TABS Oral Take 10 mg by mouth once a week. Sunday    . METOPROLOL SUCCINATE ER 50 MG PO TB24 Oral Take 50 mg by mouth 2 (two) times daily.    . CENTRUM SILVER PO Oral Take 1 tablet by mouth every evening.     Marland Kitchen NIACIN ER (ANTIHYPERLIPIDEMIC) 1000 MG PO TBCR Oral Take 2,000 mg by mouth at bedtime.    Marland Kitchen ONDANSETRON HCL 4 MG PO TABS Oral Take 1 tablet (4 mg total) by mouth every 6 (six) hours. 12 tablet 0  . PREDNISONE 20 MG PO TABS Oral Take 10 mg by mouth 3 (three) times a week. 1  tablet x5 days, a half tab x5 days, then a half a tablet Monday Wednesday Friday for a 3 week taper    . TRAMADOL HCL 50 MG PO TABS Oral Take 50 mg by mouth every 8 (eight) hours as needed. For pain    . WARFARIN SODIUM 5 MG PO TABS Oral Take 2.5-5 mg by mouth daily at 6 PM. Mondays, Wednesday, and Fridays only pt takes a half tab (2.5mg ) All other days 1 tab (5mg )    . NITROGLYCERIN 0.4 MG SL SUBL Sublingual Place 0.4 mg under the tongue every 5 (five) minutes as needed. For chest pain      BP 118/69  Pulse 95  Temp(Src) 98.4 F (36.9 C) (Oral)  Resp 22  SpO2 98% BP 138/88  Pulse 88  Temp(Src) 98.4 F (36.9 C) (Oral)  Resp 20  SpO2 96%   Physical Exam CONSTITUTIONAL: Well developed/well nourished HEAD AND FACE: Normocephalic/atraumatic EYES: EOMI/PERRL ENMT: Mucous membranes dry NECK: supple no meningeal signs SPINE:entire spine nontender CV: irregular, no loud murmurs LUNGS: Lungs are clear to auscultation bilaterally, no apparent distress ABDOMEN: soft, nontender, no rebound or guarding, +BS noted GU:no cva tenderness NEURO: Pt is awake/alert, moves all extremitiesx4, no focal motor deficits are noted in the lower EXTREMITIES: pulses normal, full ROM SKIN: warm, color normal.  He has scattered erythematous papules to his abdomen, the rash blanches.  No tenderness is noted PSYCH: no abnormalities of mood noted  ED Course  Procedures    Labs Reviewed  CBC  DIFFERENTIAL  LIPASE, BLOOD  COMPREHENSIVE METABOLIC PANEL  LACTIC ACID, PLASMA  DIGOXIN LEVEL  PROTIME-INR   1:13 AM Pt with recent ED evaluation for abd pain, had negative CT abd/pelvis, but now returns with continued pain and now diarrhea.  His caregiver found him lying in his own stool/urine.  Will need admission   2:52 AM Pt stable, no tachycardia, labs reveal dehydration Will admit D/w dr Lovell Sheehan, will admit  MDM  Nursing notes reviewed and considered in documentation All labs/vitals reviewed and  considered Previous records reviewed and considered        Date: 02/07/2012  Rate: 103  Rhythm: atrial fibrillation  QRS Axis: normal  Intervals: normal  ST/T Wave abnormalities: nonspecific ST changes  Conduction Disutrbances:none  Narrative Interpretation: artifact noted  Old EKG Reviewed: unchanged    Joya Gaskins, MD 02/07/12 [phone]

## 2012-02-07 NOTE — Progress Notes (Signed)
ANTICOAGULATION CONSULT NOTE - Initial Consult  Pharmacy Consult for Coumadin Indication: atrial fibrillation, hx TIA  Allergies  Allergen Reactions  . Levaquin     Rash 1-13    Patient Measurements: Height: 6' (182.9 cm) Weight: 181 lb 6.4 oz (82.283 kg) IBW/kg (Calculated) : 77.6   Vital Signs: Temp: 97.9 F (36.6 C) (03/16 1156) Temp src: Oral (03/16 1156) BP: 68/48 mmHg (03/16 1156) Pulse Rate: 88  (03/16 1156)  Labs:  Basename 02/07/12 1245 02/07/12 1055 02/07/12 0050 02/05/12 1746  HGB -- 12.5* 13.1 --  HCT -- 36.9* 37.7* 38.0*  PLT -- 100* 115* 134*  APTT -- -- -- --  LABPROT -- -- 20.8* --  INR -- -- 1.76* --  HEPARINUNFRC -- -- -- --  CREATININE -- 0.93 0.74 0.63  CKTOTAL 39 -- -- --  CKMB 1.8 -- -- --  TROPONINI <0.30 -- -- --   Estimated Creatinine Clearance: 64.9 ml/min (by C-G formula based on Cr of 0.93).  Medical History: Past Medical History  Diagnosis Date  . Arrhythmia   . Carotid artery occlusion     right total internal artery occlusion  . Coronary artery disease     s/p cardiac cath in 1990s, and cardiolite study  in 2005 showing EF 54%  . Hyperlipidemia   . Hypertension   . TIA (transient ischemic attack)   . Atrial fibrillation   . Myocardial infarction 1992 and 1994    history of  . Degenerative disc disease     cervical spine   Assessment:   Admitted with diarrhea, generalized weakness, hyponatremia.  Rash noted on trunk and arms, denies itching.   Patient reported falling at home, has laceration on forehead.  Head CT negative for bleed. Home Coumadin regimen: 2.5 mg MWF; 5 mg TTSS. Last dose 3/14 at home.  INR is subtherapeutic today. Thrombocytopenia. Was on Prednisone at home, now on IV Hydrocortisone. Holding home Methotrexate. ? Cause of rash?  Goal of Therapy:  INR 2-3   Plan:    Will give Coumadin 6 mg tonight.   Daily PT/INR.   Will f/u platelet counts with you & monitor for any bleeding.      Will add offending  agent to allergy list, if rash determined to be med-related.    Dennie Fetters, [place] Pager: [phone] 02/07/2012,4:09 PM

## 2012-02-08 DIAGNOSIS — E2740 Unspecified adrenocortical insufficiency: Secondary | ICD-10-CM

## 2012-02-08 LAB — URINALYSIS, ROUTINE W REFLEX MICROSCOPIC
Nitrite: NEGATIVE
Specific Gravity, Urine: 1.019 (ref 1.005–1.030)
Urobilinogen, UA: 1 mg/dL (ref 0.0–1.0)

## 2012-02-08 LAB — COMPREHENSIVE METABOLIC PANEL
ALT: 33 U/L (ref 0–53)
CO2: 25 mEq/L (ref 19–32)
Calcium: 8.2 mg/dL — ABNORMAL LOW (ref 8.4–10.5)
Creatinine, Ser: 0.75 mg/dL (ref 0.50–1.35)
GFR calc Af Amer: >90 mL/min (ref >90)
GFR calc non Af Amer: 82 mL/min — ABNORMAL LOW (ref >90)
Glucose, Bld: 96 mg/dL (ref 70–99)
Sodium: 132 mEq/L — ABNORMAL LOW (ref 135–145)

## 2012-02-08 LAB — URINE MICROSCOPIC-ADD ON

## 2012-02-08 LAB — CBC
Hemoglobin: 11.6 g/dL — ABNORMAL LOW (ref 13.0–17.0)
MCH: 31.9 pg (ref 26.0–34.0)
MCV: 91.8 fL (ref 78.0–100.0)
RBC: 3.64 MIL/uL — ABNORMAL LOW (ref 4.22–5.81)

## 2012-02-08 MED ORDER — WARFARIN SODIUM 4 MG PO TABS
4.0000 mg | ORAL_TABLET | Freq: Once | ORAL | Status: AC
  Administered 2012-02-08: 4 mg via ORAL
  Filled 2012-02-08: qty 1

## 2012-02-08 MED ORDER — LEVOTHYROXINE SODIUM 100 MCG PO TABS
100.0000 ug | ORAL_TABLET | Freq: Every day | ORAL | Status: DC
  Administered 2012-02-09 – 2012-02-10 (×2): 100 ug via ORAL
  Filled 2012-02-08 (×3): qty 1

## 2012-02-08 MED ORDER — METOPROLOL SUCCINATE ER 50 MG PO TB24
50.0000 mg | ORAL_TABLET | Freq: Every day | ORAL | Status: DC
  Administered 2012-02-08 – 2012-02-10 (×3): 50 mg via ORAL
  Filled 2012-02-08 (×3): qty 1

## 2012-02-08 MED ORDER — HYDROCORTISONE SOD SUCCINATE 100 MG IJ SOLR
50.0000 mg | Freq: Two times a day (BID) | INTRAMUSCULAR | Status: DC
  Administered 2012-02-09 – 2012-02-10 (×3): 50 mg via INTRAVENOUS
  Filled 2012-02-08 (×5): qty 1

## 2012-02-08 NOTE — Progress Notes (Signed)
Subjective: "Much Better"   Objective: Vital signs in last 24 hours: Temp:  [97.6 F (36.4 C)-98.7 F (37.1 C)] 97.6 F (36.4 C) (03/17 0500) Pulse Rate:  [87-95] 95  (03/17 0500) Resp:  [18] 18  (03/17 0500) BP: (98-130)/(62-85) 130/85 mmHg (03/17 0500) SpO2:  [94 %-98 %] 98 % (03/17 0500) Weight change:  Last BM Date: 02/07/12  Intake/Output from previous day: 03/16 0701 - 03/17 0700 In: 2233.3 [I.V.:2033.3; IV Piggyback:200] Out: 177 [Urine:175; Stool:2]    Physical Exam: General: Alert, awake, oriented x3, in no acute distress. HEENT: No bruits, no goiter. Heart: Regular rate and rhythm, without murmurs, rubs, gallops. Lungs: Clear to auscultation bilaterally. Abdomen: Soft, nontender, nondistended, positive bowel sounds. Extremities: No clubbing cyanosis or edema with positive pedal pulses. Neuro: Grossly intact, nonfocal.  Lab Results: Basic Metabolic Panel:  Basename 02/08/12 0718 02/07/12 1055  NA 132* 131*  K 3.7 4.1  CL 99 99  CO2 25 25  GLUCOSE 96 87  BUN 15 17  CREATININE 0.75 0.93  CALCIUM 8.2* 8.2*  MG 1.8 --  PHOS -- --   Liver Function Tests:  [facility] 02/08/12 0718 02/07/12 0050  AST 44* 63*  ALT 33 44  ALKPHOS 50 58  BILITOT 0.5 0.8  PROT 5.2* 5.8*  ALBUMIN 2.4* 2.9*    Basename 02/07/12 0050 02/05/12 1746  LIPASE 37 32  AMYLASE -- --   No results found for this basename: AMMONIA:2 in the last 72 hours CBC:  Basename 02/08/12 0718 02/07/12 1055 02/07/12 0050 02/05/12 1746  WBC 11.8* 10.3 -- --  NEUTROABS -- -- 7.1 7.7  HGB 11.6* 12.5* -- --  HCT 33.4* 36.9* -- --  MCV 91.8 93.9 -- --  PLT 99* 100* -- --   Cardiac Enzymes:  Basename 02/07/12 1245  CKTOTAL 39  CKMB 1.8  CKMBINDEX --  TROPONINI <0.30   Thyroid Function Tests:  Basename 02/07/12 1245  TSH 9.388*  T4TOTAL --  FREET4 --  T3FREE --  THYROIDAB --   Coagulation:  Basename 02/08/12 0718 02/07/12 0050  LABPROT 26.6* 20.8*  INR 2.41* 1.76*    Urinalysis:  Basename 02/05/12 1901  COLORURINE YELLOW  LABSPEC 1.016  PHURINE 7.5  GLUCOSEU NEGATIVE  HGBUR NEGATIVE  BILIRUBINUR NEGATIVE  KETONESUR NEGATIVE  PROTEINUR NEGATIVE  UROBILINOGEN 0.2  NITRITE NEGATIVE  LEUKOCYTESUR NEGATIVE   Misc. Labs:  Recent Results (from the past 240 hour(s))  CLOSTRIDIUM DIFFICILE BY PCR     Status: Normal   Collection Time   02/07/12 11:37 AM      Component Value Range Status Comment   C difficile by pcr NEGATIVE  NEGATIVE  Final     Studies/Results: Ct Head Wo Contrast  02/07/2012  *RADIOLOGY REPORT*  Clinical Data: Fall with calcaneal fracture.  Weakness.  CT HEAD WITHOUT CONTRAST  Technique:  Contiguous axial images were obtained from the base of the skull through the vertex without contrast.  Comparison: CT head without contrast 11/26/2011.  Findings: Remote infarcts of the right caudate head and frontal lobe are stable.  Moderate generalized atrophy and white matter disease is similar to the prior exam.  No acute cortical infarct, hemorrhage, or mass lesion is present.  Minimal posterior right ethmoid sinus disease is evident.  The paranasal sinuses and mastoid air cells are otherwise clear.  IMPRESSION:  1.  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 2.  Atherosclerosis. 3.  Stable atrophy and chronic white matter disease. 4.  No acute intracranial abnormality or significant interval change.  Original Report Authenticated By: Jamesetta Orleans. MATTERN, M.D.    Medications: Scheduled Meds:   . atorvastatin  10 mg Oral QHS  . digoxin  125 mcg Oral Daily  . hydrocortisone sod succinate (SOLU-CORTEF) injection  50 mg Intravenous Q6H  . levothyroxine  75 mcg Oral Daily  . potassium chloride  10 mEq Intravenous Q1 Hr x 2  . warfarin  4 mg Oral ONCE-1800  . warfarin  6 mg Oral ONCE-1800  . Warfarin - Pharmacist Dosing Inpatient   Does not apply q1800   Continuous Infusions:   . sodium chloride 125 mL/hr at  02/08/12 1419   PRN Meds:.acetaminophen, acetaminophen, acetaminophen, alum & mag hydroxide-simeth, dicyclomine, metoprolol, morphine injection, ondansetron (ZOFRAN) IV, ondansetron, oxyCODONE, zolpidem  Assessment/Plan:  Active Problems:  HYPERLIPIDEMIA  HYPERTENSION  CORONARY ARTERY DISEASE  Atrial fibrillation  Anemia  Diarrhea  Weakness generalized  Hyponatremia  Dehydration  Thrombocytopenia  78 yo with PMH sig CAD, AFIB and HTN admitted overnight with weakness, diarrhea. His in home caregiver from [employer] found him lying in his stool and urine, too weak to get himself to the bathroom. IN ED he was found to be dehydrated, in A-Fib, and continuing to have loose stools. He had normal abdominal w/u on 3/14 in ED but has not improved since then. Also reports that he fell at home-he has a bandage on his forehead and a skin lac.  Differential broad, colitis favored but CT non-diagnostic of bowel wall thickening or inflammation.  1. Diarrhea/Abd Pain-gastroenteritis likely resolving 2. Dehydration/Weakness-from NV 3. Hyponatremia-ADRENAL INSUFF driven 4. Thombocytopenia 5. Mild elevation in AST 6. Fall at home, Head laceration-AI related 7. Has Asthma on steroids in past frequently, so will need stress dose steroids 8. Rheumatoid Arthritis on Methotrexate-discontinued needs to see rhuematology 9. Papular rash few days probably from methotrexate 10 Hypothyroidism- TSH still high, need to adjust replacement therapy  Suspect Adrenal Insufficiency and Methotrexate side effects. 3/17 Much improved today, no diarrhea, says he feels so much better. I think perhaps he had adrenal Insufficiency and also methotrexate side effects. Much better with stress dose steroids.  Plan: 1. Restart ASA 2. Taper stress dose steroids 3. Stop Methotrexate, should not go back on this medication 4. Increase Levthyroxine 5. PT OT eval    LOS: 2 days   [facility] Pager: [phone] 02/08/2012, 5:44 PM     Anderson Malta, DO Internal Medicine [phone]

## 2012-02-08 NOTE — Progress Notes (Signed)
ANTICOAGULATION CONSULT NOTE - Follow Up Consult  Pharmacy Consult for Coumadin Indication: atrial fibrillation, hx TIA  Allergies  Allergen Reactions  . Levaquin     Rash 1-13    Patient Measurements: Height: 6' (182.9 cm) Weight: 181 lb 6.4 oz (82.283 kg) IBW/kg (Calculated) : 77.6   Vital Signs: Temp: 97.6 F (36.4 C) (03/17 0500) Temp src: Oral (03/17 0500) BP: 130/85 mmHg (03/17 0500) Pulse Rate: 95  (03/17 0500)  Labs:  Basename 02/08/12 0718 02/07/12 1245 02/07/12 1055 02/07/12 0050  HGB 11.6* -- 12.5* --  HCT 33.4* -- 36.9* 37.7*  PLT 99* -- 100* 115*  APTT -- -- -- --  LABPROT 26.6* -- -- 20.8*  INR 2.41* -- -- 1.76*  HEPARINUNFRC -- -- -- --  CREATININE 0.75 -- 0.93 0.74  CKTOTAL -- 39 -- --  CKMB -- 1.8 -- --  TROPONINI -- <0.30 -- --   Estimated Creatinine Clearance: 75.4 ml/min (by C-G formula based on Cr of 0.75).  Assessment:   INR back into target range after 6 mg dose 3/16, instead of usual 5 mg dose.  More increase in INR than expected from 1 extra mg.  Platelet count low stable. No bleeding noted.  Goal of Therapy:  INR 2-3   Plan:    Will give Coumadin 4 mg today, instead of usual 5 mg dose, to try to keep INR from rising much more. Continue daily PT/INR.  Will follow-up for cause of rash, and add med to allergy list if determined to be med-related.  Dennie Fetters, RPh Pager: [phone] 02/08/2012,1:38 PM

## 2012-02-09 ENCOUNTER — Encounter (HOSPITAL_COMMUNITY): Payer: Self-pay | Admitting: General Practice

## 2012-02-09 LAB — BASIC METABOLIC PANEL
CO2: 22 mEq/L (ref 19–32)
Chloride: 105 mEq/L (ref 96–112)
Glucose, Bld: 87 mg/dL (ref 70–99)
Sodium: 135 mEq/L (ref 135–145)

## 2012-02-09 LAB — CBC
HCT: 34.8 % — ABNORMAL LOW (ref 39.0–52.0)
Hemoglobin: 11.9 g/dL — ABNORMAL LOW (ref 13.0–17.0)
MCH: 31.5 pg (ref 26.0–34.0)
MCV: 92.1 fL (ref 78.0–100.0)
RBC: 3.78 MIL/uL — ABNORMAL LOW (ref 4.22–5.81)
WBC: 11.2 10*3/uL — ABNORMAL HIGH (ref 4.0–10.5)

## 2012-02-09 LAB — PROTIME-INR: INR: 3.18 — ABNORMAL HIGH (ref 0.00–1.49)

## 2012-02-09 MED ORDER — POTASSIUM CHLORIDE 20 MEQ PO PACK
40.0000 meq | PACK | Freq: Once | ORAL | Status: DC
  Filled 2012-02-09: qty 2

## 2012-02-09 MED ORDER — POTASSIUM CHLORIDE CRYS ER 20 MEQ PO TBCR
40.0000 meq | EXTENDED_RELEASE_TABLET | Freq: Once | ORAL | Status: AC
  Administered 2012-02-09: 40 meq via ORAL
  Filled 2012-02-09: qty 2

## 2012-02-09 NOTE — Progress Notes (Signed)
ANTICOAGULATION CONSULT NOTE - Follow Up Consult  Pharmacy Consult for Coumadin Indication: atrial fibrillation, hx TIA  Allergies  Allergen Reactions  . Levaquin     Rash 1-13  . Methotrexate Derivatives Nausea And Vomiting    Patient Measurements: Height: 6' (182.9 cm) Weight: 181 lb 6.4 oz (82.283 kg) IBW/kg (Calculated) : 77.6   Vital Signs: Temp: 98.1 F (36.7 C) (03/18 0500) Temp src: Oral (03/18 0500) BP: 141/79 mmHg (03/18 1008) Pulse Rate: 96  (03/18 1008)  Labs:  Basename 02/09/12 0710 02/08/12 0718 02/07/12 1245 02/07/12 1055 02/07/12 0050  HGB 11.9* 11.6* -- -- --  HCT 34.8* 33.4* -- 36.9* --  PLT 143* 99* -- 100* --  APTT -- -- -- -- --  LABPROT 33.1* 26.6* -- -- 20.8*  INR 3.18* 2.41* -- -- 1.76*  HEPARINUNFRC -- -- -- -- --  CREATININE 0.67 0.75 -- 0.93 --  CKTOTAL -- -- 39 -- --  CKMB -- -- 1.8 -- --  TROPONINI -- -- <0.30 -- --   Estimated Creatinine Clearance: 75.4 ml/min (by C-G formula based on Cr of 0.67).  Assessment: Admitted with diarrhea, generalized weakness, hyponatremia,dehydration, thrombocytopenia. Rash noted on trunk and arms, denies itching. Patient reported falling at home, has laceration on forehead. Head CT negative for bleed.  Coumadin for h/o afib, TIA. Home Coumadin regimen: 2.5 mg MWF; 5 mg TTSS. INR 3.18. Thrombocytopenia improved to 143.  Infectious Disease - WBC 11.2, Afebrile, no abx. Cdiff PCR negative  Cardiovascular - CAD/afib/HTN: VSS-   Meds: Dig, Lipitor,Toprol  Endocrinology - TSH 9.388. Home Syntrhoid might need incr dose.  On IV HC for rash. Was on Prednisone at home for asthma vs adrenal insuff.. Holding home Methotrexate. Cause of rash?  Pulm: asthma  Gastrointestinal /Nutrition - diarrhea - Cdiff negative. Gastroenteritis?  Neurology - no issues  Nephrology - Scr 0.67  Hematology / Oncology -Watch thrombocytopenia on admission.  Home meds not resumed: ASA, OscalD, Santyl Oint, lasix, Eucerin, MTX  (rash reaction), Niacin,   Goal of Therapy:  INR 2-3   Plan:    No Coumadin today. MTX added to allergy profile. Pasty Spillers, PharmD 02/09/2012,10:38 AM

## 2012-02-09 NOTE — Progress Notes (Signed)
Subjective: "Much Better"   Objective: Vital signs in last 24 hours: Temp:  [97.8 F (36.6 C)-98.1 F (36.7 C)] 98.1 F (36.7 C) (03/18 0500) Pulse Rate:  [74-96] 96  (03/18 1008) Resp:  [18] 18  (03/18 0500) BP: (119-141)/(64-79) 141/79 mmHg (03/18 1008) SpO2:  [96 %] 96 % (03/18 0500) Weight change:  Last BM Date: 02/09/12  Intake/Output from previous day: 03/17 0701 - 03/18 0700 In: 3025 [I.V.:3025] Out: 576 [Urine:575; Stool:1] Total I/O In: -  Out: 300 [Urine:300]  Physical Exam: General: Alert, awake, oriented x3, in no acute distress. HEENT: No bruits, no goiter. Heart: Regular rate and rhythm, without murmurs, rubs, gallops. Lungs: Clear to auscultation bilaterally. Abdomen: Soft, nontender, nondistended, positive bowel sounds. Extremities: No clubbing cyanosis or edema with positive pedal pulses. Neuro: Grossly intact, nonfocal.  Lab Results: Basic Metabolic Panel:  Basename 02/09/12 0710 02/08/12 0718  NA 135 132*  K 3.3* 3.7  CL 105 99  CO2 22 25  GLUCOSE 87 96  BUN 9 15  CREATININE 0.67 0.75  CALCIUM 8.4 8.2*  MG -- 1.8  PHOS -- --   Liver Function Tests:  [facility] 02/08/12 0718 02/07/12 0050  AST 44* 63*  ALT 33 44  ALKPHOS 50 58  BILITOT 0.5 0.8  PROT 5.2* 5.8*  ALBUMIN 2.4* 2.9*    Basename 02/07/12 0050  LIPASE 37  AMYLASE --   No results found for this basename: AMMONIA:2 in the last 72 hours CBC:  Basename 02/09/12 0710 02/08/12 0718 02/07/12 0050  WBC 11.2* 11.8* --  NEUTROABS -- -- 7.1  HGB 11.9* 11.6* --  HCT 34.8* 33.4* --  MCV 92.1 91.8 --  PLT 143* 99* --   Cardiac Enzymes:  Basename 02/07/12 1245  CKTOTAL 39  CKMB 1.8  CKMBINDEX --  TROPONINI <0.30   Thyroid Function Tests:  Basename 02/07/12 1245  TSH 9.388*  T4TOTAL --  FREET4 --  T3FREE --  THYROIDAB --   Coagulation:  Basename 02/09/12 0710 02/08/12 0718  LABPROT 33.1* 26.6*  INR 3.18* 2.41*   Urinalysis:  Basename 02/08/12 2309    COLORURINE YELLOW  LABSPEC 1.019  PHURINE 6.5  GLUCOSEU NEGATIVE  HGBUR NEGATIVE  BILIRUBINUR NEGATIVE  KETONESUR NEGATIVE  PROTEINUR NEGATIVE  UROBILINOGEN 1.0  NITRITE NEGATIVE  LEUKOCYTESUR SMALL*   Misc. Labs:  Recent Results (from the past 240 hour(s))  CLOSTRIDIUM DIFFICILE BY PCR     Status: Normal   Collection Time   02/07/12 11:37 AM      Component Value Range Status Comment   C difficile by pcr NEGATIVE  NEGATIVE  Final   STOOL CULTURE     Status: Normal (Preliminary result)   Collection Time   02/09/12  5:40 AM      Component Value Range Status Comment   Specimen Description STOOL   Final    Special Requests NONE   Final    Culture Culture reincubated for better growth   Final    Report Status PENDING   Incomplete     Studies/Results: Ct Head Wo Contrast  02/07/2012  *RADIOLOGY REPORT*  Clinical Data: Fall with calcaneal fracture.  Weakness.  CT HEAD WITHOUT CONTRAST  Technique:  Contiguous axial images were obtained from the base of the skull through the vertex without contrast.  Comparison: CT head without contrast 11/26/2011.  Findings: Remote infarcts of the right caudate head and frontal lobe are stable.  Moderate generalized atrophy and white matter disease is similar to the prior exam.  No acute cortical infarct, hemorrhage, or mass lesion is present.  Minimal posterior right ethmoid sinus disease is evident.  The paranasal sinuses and mastoid air cells are otherwise clear.  IMPRESSION:  1.  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 2.  Atherosclerosis. 3.  Stable atrophy and chronic white matter disease. 4.  No acute intracranial abnormality or significant interval change.  Original Report Authenticated By: Jamesetta Orleans. MATTERN, M.D.    Medications: Scheduled Meds:    . atorvastatin  10 mg Oral QHS  . digoxin  125 mcg Oral Daily  . hydrocortisone sod succinate (SOLU-CORTEF) injection  50 mg Intravenous Q12H  . levothyroxine   100 mcg Oral QAC breakfast  . metoprolol succinate  50 mg Oral Daily  . warfarin  4 mg Oral ONCE-1800  . Warfarin - Pharmacist Dosing Inpatient   Does not apply q1800  . DISCONTD: hydrocortisone sod succinate (SOLU-CORTEF) injection  50 mg Intravenous Q6H  . DISCONTD: levothyroxine  75 mcg Oral Daily   Continuous Infusions:    . sodium chloride 125 mL/hr at 02/09/12 0535   PRN Meds:.acetaminophen, acetaminophen, acetaminophen, alum & mag hydroxide-simeth, dicyclomine, morphine injection, ondansetron (ZOFRAN) IV, ondansetron, oxyCODONE, zolpidem, DISCONTD: metoprolol  Assessment/Plan:  Active Problems:  HYPERLIPIDEMIA  HYPERTENSION  CORONARY ARTERY DISEASE  Atrial fibrillation  Anemia  Diarrhea  Weakness generalized  Hyponatremia  Dehydration  Thrombocytopenia  Adrenal insufficiency  78 yo with PMH sig CAD, AFIB and HTN admitted overnight with weakness, diarrhea. His in home caregiver from [employer] found him lying in his stool and urine, too weak to get himself to the bathroom. IN ED he was found to be dehydrated, in A-Fib, and continuing to have loose stools. He had normal abdominal w/u on 3/14 in ED but has not improved since then. Also reports that he fell at home-he has a bandage on his forehead and a skin lac.  Differential broad, colitis favored but CT non-diagnostic of bowel wall thickening or inflammation.  1. Diarrhea/Abd Pain-gastroenteritis likely resolved 2. Dehydration/Weakness-from NV 3. Hyponatremia-ADRENAL INSUFF driven 4. Thombocytopenia 5. Mild elevation in AST 6. Fall at home, Head laceration-AI related 7. Has Asthma on steroids in past frequently, so will need stress dose steroids 8. Rheumatoid Arthritis on Methotrexate-discontinued needs to see rhuematology 9. Papular rash few days probably from methotrexate 10 Hypothyroidism- TSH still high, need to adjust replacement therapy  Suspect Adrenal Insufficiency and Methotrexate side effects. 3/17  Much improved today, no diarrhea, says he feels so much better. I think perhaps he had adrenal Insufficiency and also methotrexate side effects. Much better with stress dose steroids.  3/18 Some diarrhea this AM small amount but much better. C. Diff negative, can d/c contact  Plan: 1. Restart ASA 2. Taper stress dose steroids 3. Stop Methotrexate, should not go back on this medication 4. Increased Levthyroxine yesterday 5. PT OT eval-need recs for discharge, will need a walker at home for sure-has in home 24/7 caregivers 6. Replete K    LOS: 3 days   [facility] Pager: [phone] 02/09/2012, 11:30 AM     Anderson Malta, DO Internal Medicine [phone]

## 2012-02-09 NOTE — Progress Notes (Signed)
   CARE MANAGEMENT NOTE 02/09/2012  Patient:  [name] [name], [name] [name]   Account Number:  [id number]  Date Initiated:  02/09/2012  Documentation initiated by:  Darlyne Russian  Subjective/Objective Assessment:   Patient admitted with diarrhea and fall at home.     Action/Plan:   Patient lives at home with spouse. [employer] provide assistance to wife 11 am to 7 pm.   Anticipated DC Date:  02/10/2012   Anticipated DC Plan:  HOME W HOME HEALTH SERVICES      DC Planning Services  CM consult      [facility] [place] Choice  HOME HEALTH   Choice offered to / List presented to:  C-1 Patient        HH arranged  HH-2 PT  HH-1 RN  HH-4 NURSE'S AIDE      HH agency  [facility]   Status of service:  In process, will continue to follow Medicare Important Message given?   (If response is "NO", the following Medicare IM given date fields will be blank) Date Medicare IM given:   Date Additional Medicare IM given:    Discharge Disposition:    Per UR Regulation:    If discussed at Long Length of Stay Meetings, dates discussed:    Comments:  PCP: Dr Kelle Darting  02/09/2012 4:00 pm Darlyne Russian RN, CCM [phone] Met with patient to discuss discharge planning. He stated at home they use [employer] for his wife from 11 am to 7 pm.  Explained to patient [employer] [facility] [facility] does not provide skilled home care visits. He had home care in the past and thinks it was [facility], his girls know which one. He requested [facility] provided home care. CM will contact [facility] for his home care services and DME needs. CM to continue to follow for discharge needs. Call to [facility]/ [facility] for home care referral for RN, PT, aide.

## 2012-02-10 LAB — PROTIME-INR
INR: 3.91 — ABNORMAL HIGH (ref 0.00–1.49)
Prothrombin Time: 38.9 seconds — ABNORMAL HIGH (ref 11.6–15.2)

## 2012-02-10 MED ORDER — LEVOTHYROXINE SODIUM 100 MCG PO TABS: 100.0000 ug | ORAL_TABLET | Freq: Every day | ORAL | Status: DC

## 2012-02-10 MED ORDER — HYDROCORTISONE 10 MG PO TABS: 10.0000 mg | ORAL_TABLET | Freq: Two times a day (BID) | ORAL | Status: AC

## 2012-02-10 MED ORDER — DICYCLOMINE HCL 10 MG PO CAPS: 10.0000 mg | ORAL_CAPSULE | Freq: Four times a day (QID) | ORAL | Status: DC | PRN

## 2012-02-10 NOTE — Progress Notes (Addendum)
Note below is on wrong patient- please disregard!!!   Lise Auer, OT   OT  Note  Pt explained role of OT.  OT explained importance of correct sling position.  Pt refused to participate with OT at this time.  Will check back at schedule allows. Lise Auer, OT  Jeanie Sewer, Karin Golden D 02/10/2012, 12:51 PM

## 2012-02-10 NOTE — Evaluation (Signed)
Physical Therapy Evaluation Patient Details Name: [name] [name] MRN: [id number] DOB: [birth year] Today's Date: 02/10/2012  Problem List:  Patient Active Problem List  Diagnoses  . HYPERLIPIDEMIA  . HYPERTENSION  . MYOCARDIAL INFARCTION, HX OF  . CORONARY ARTERY DISEASE  . Atrial fibrillation  . ALLERGIC RHINITIS  . TRANSIENT ISCHEMIC ATTACK, HX OF  . Encounter for long-term (current) use of anticoagulants  . Anemia  . Peripheral edema  . COPD with asthma  . Abdominal pain, acute, epigastric  . Diarrhea  . Weakness generalized  . Hyponatremia  . Dehydration  . Thrombocytopenia  . Adrenal insufficiency    Past Medical History:  Past Medical History  Diagnosis Date  . Carotid artery occlusion     right total internal artery occlusion  . Coronary artery disease     s/p cardiac cath in 1990s, and cardiolite study  in 2005 showing EF 54%  . Hyperlipidemia   . Hypertension   . TIA (transient ischemic attack)   . Degenerative disc disease     cervical spine  . Arrhythmia   . Atrial fibrillation 03/2011  . Myocardial infarction 1992 and 1994  . Angina     "jaw & elbow was the worse"  . Pneumonia ~ 1935  . GERD (gastroesophageal reflux disease)   . Skin cancer     forehead  . Urgency incontinence   . Bilateral cellulitis of lower leg    Past Surgical History:  Past Surgical History  Procedure Date  . Cataract extraction w/ intraocular lens  implant, bilateral   . Cardiac catheterization 1990s  . Skin cancer excision 12/2011    forehead    PT Assessment/Plan/Recommendation PT Assessment Clinical Impression Statement: Patient admitted with diarrhea and N/V.  Slightly deconditioned therefore will benefit from use of RW at all times on d/c and HHPT f/u.  Continue PT.   PT Recommendation/Assessment: Patient will need skilled PT in the acute care venue PT Problem List: Decreased strength;Decreased activity tolerance;Decreased balance;Decreased mobility;Decreased safety  awareness;Decreased knowledge of precautions;Decreased knowledge of use of DME PT Therapy Diagnosis : Difficulty walking;Generalized weakness PT Plan PT Frequency: Min 3X/week PT Treatment/Interventions: DME instruction;Gait training;Functional mobility training;Therapeutic activities;Therapeutic exercise;Balance training;Patient/family education PT Recommendation Follow Up Recommendations: Home health PT;Supervision - Intermittent Equipment Recommended: None recommended by PT PT Goals  Acute Rehab PT Goals PT Goal Formulation: With patient Time For Goal Achievement: 7 days Pt will go Supine/Side to Sit: Independently PT Goal: Supine/Side to Sit - Progress: Goal set today Pt will go Sit to Stand: Independently PT Goal: Sit to Stand - Progress: Goal set today Pt will Ambulate: 51 - 150 feet;with modified independence;with least restrictive assistive device PT Goal: Ambulate - Progress: Goal set today Pt will Perform Home Exercise Program: Independently PT Goal: Perform Home Exercise Program - Progress: Goal set today  PT Evaluation Precautions/Restrictions  Precautions Precautions: Fall Required Braces or Orthoses: No Restrictions Weight Bearing Restrictions: No Prior Functioning  Home Living Lives With: Spouse (24 hour care) Receives Help From: Family ([employer] wash dishes and meals, 5 days/wk 11-7) Type of Home: House Home Layout: One level Home Access: Ramped entrance Bathroom Shower/Tub: Health visitor: Standard Home Adaptive Equipment: Straight cane;Tub transfer bench;Walker - rolling;Wheelchair - manual;Hand-held shower hose Additional Comments: pt will have 24 hour a day help for a week Prior Function Level of Independence: Independent with basic ADLs;Independent with homemaking with ambulation;Independent with gait;Independent with transfers Driving: No Vocation: Retired Producer, television/film/video: Awake/alert Overall Cognitive  Status:  Appears within functional limits for tasks assessed Sensation/Coordination Sensation Light Touch: Appears Intact Stereognosis: Not tested Hot/Cold: Not tested Proprioception: Not tested Coordination Gross Motor Movements are Fluid and Coordinated: Yes Fine Motor Movements are Fluid and Coordinated: Yes Extremity Assessment RUE Assessment RUE Assessment: Within Functional Limits LUE Assessment LUE Assessment: Within Functional Limits RLE Assessment RLE Assessment: Within Functional Limits LLE Assessment LLE Assessment: Within Functional Limits Mobility (including Balance) Bed Mobility Bed Mobility: Yes Supine to Sit: 6: Modified independent (Device/Increase time);HOB flat Transfers Transfers: Yes Sit to Stand: 5: Supervision;With upper extremity assist Sit to Stand Details (indicate cue type and reason): Cues for hand placement. Stand to Sit: 5: Supervision;With upper extremity assist Stand to Sit Details: controlled descent; cues for hand placement Ambulation/Gait Ambulation/Gait: Yes Ambulation/Gait Assistance: 5: Supervision Ambulation/Gait Assistance Details (indicate cue type and reason): Ambulated with RW without LOB with good safety.  Patient fearful to ambulate without RW today per pt.  Needed cues for keeping RW close to him during turning. Ambulation Distance (Feet): 400 Feet Assistive device: Rolling walker Gait Pattern: Step-through pattern;Decreased stride length Stairs: No Wheelchair Mobility Wheelchair Mobility: No  Posture/Postural Control Posture/Postural Control: No significant limitations Balance Balance Assessed: No Exercise  General Exercises - Lower Extremity Ankle Circles/Pumps: AROM;Both;10 reps;Seated Long Arc Quad: AROM;Both;10 reps;Seated End of Session PT - End of Session Equipment Utilized During Treatment: Gait belt Activity Tolerance: Patient tolerated treatment well Patient left: in chair;with call bell in reach Nurse  Communication: Mobility status for transfers;Mobility status for ambulation General Behavior During Session: [facility] for tasks performed Cognition: [facility] for tasks performed  INGOLD,David Rodriquez 02/10/2012, 2:01 PM  [facility] Acute Rehabilitation [phone] (pager)

## 2012-02-10 NOTE — Progress Notes (Signed)
Occupational Therapy Evaluation Patient Details Name: [name] [name] MRN: [id number] DOB: 1925-07-02 Today's Date: 02/10/2012  Problem List:  Patient Active Problem List  Diagnoses  . HYPERLIPIDEMIA  . HYPERTENSION  . MYOCARDIAL INFARCTION, HX OF  . CORONARY ARTERY DISEASE  . Atrial fibrillation  . ALLERGIC RHINITIS  . TRANSIENT ISCHEMIC ATTACK, HX OF  . Encounter for long-term (current) use of anticoagulants  . Anemia  . Peripheral edema  . COPD with asthma  . Abdominal pain, acute, epigastric  . Diarrhea  . Weakness generalized  . Hyponatremia  . Dehydration  . Thrombocytopenia  . Adrenal insufficiency    Past Medical History:  Past Medical History  Diagnosis Date  . Carotid artery occlusion     right total internal artery occlusion  . Coronary artery disease     s/p cardiac cath in 1990s, and cardiolite study  in 2005 showing EF 54%  . Hyperlipidemia   . Hypertension   . TIA (transient ischemic attack)   . Degenerative disc disease     cervical spine  . Arrhythmia   . Atrial fibrillation 03/2011  . Myocardial infarction 1992 and 1994  . Angina     "jaw & elbow was the worse"  . Pneumonia ~ 1935  . GERD (gastroesophageal reflux disease)   . Skin cancer     forehead  . Urgency incontinence   . Bilateral cellulitis of lower leg    Past Surgical History:  Past Surgical History  Procedure Date  . Cataract extraction w/ intraocular lens  implant, bilateral   . Cardiac catheterization 1990s  . Skin cancer excision 12/2011    forehead    OT Assessment/Plan/Recommendation OT Assessment Clinical Impression Statement: Pt plans to DC home today. Will benefit from [facility] to increase I with ADL activity and return to PLOF.  Pt will have 24/7 A initially. Wife has dementia and needs help with all ADL activity OT Recommendation/Assessment: All further OT needs can be met in the next venue of care OT Problem List: Decreased strength OT Therapy Diagnosis : Generalized  weakness OT Recommendation Follow Up Recommendations: Home health OT Equipment Recommended: None recommended by OT Individuals Consulted Consulted and Agree with Results and Recommendations: Patient     OT Evaluation Precautions/Restrictions  Precautions Precautions: Fall Required Braces or Orthoses: No Restrictions Weight Bearing Restrictions: No Prior Functioning Home Living Lives With: Spouse (24 hour care) Receives Help From: Family ([employer] wash dishes and meals, 5 days/wk 11-7) Type of Home: House Home Layout: One level Home Access: Ramped entrance Bathroom Shower/Tub: Health visitor: Standard Home Adaptive Equipment: Straight cane;Tub transfer bench;Walker - rolling;Wheelchair - manual;Hand-held shower hose Additional Comments: pt will have 24 hour a day help for a week Prior Function Level of Independence: Independent with basic ADLs;Independent with homemaking with ambulation;Independent with gait;Independent with transfers Driving: No Vocation: Retired ADL ADL Grooming: Simulated;Set up Where Assessed - Grooming: Sitting, chair;Unsupported Upper Body Bathing: Simulated Where Assessed - Upper Body Bathing: Sitting, chair;Unsupported Lower Body Bathing: Simulated;Set up Where Assessed - Lower Body Bathing: Sit to stand from chair Upper Body Dressing: Performed;Supervision/safety Where Assessed - Upper Body Dressing: Sitting, chair;Supported;Standing Lower Body Dressing: Performed;Set up Where Assessed - Lower Body Dressing: Sit to stand from chair Toilet Transfer: Simulated;Set up Toilet Transfer Details (indicate cue type and reason): with walker Toilet Transfer Method: Ambulating Toilet Transfer Equipment: Regular height toilet Toileting - Clothing Manipulation: Performed;Set up Where Assessed - Toileting Clothing Manipulation: Standing Toileting - Hygiene: Simulated;Supervision/safety Ambulation Related  to ADLs: pt has walk in  shower at home.  Son in law will obtain a grab bar for patient Vision/Perception  Vision - History Baseline Vision: Wears glasses all the time Cognition Cognition Arousal/Alertness: Awake/alert Overall Cognitive Status: Appears within functional limits for tasks assessed Sensation/Coordination Sensation Light Touch: Appears Intact Stereognosis: Not tested Hot/Cold: Not tested Proprioception: Not tested Coordination Gross Motor Movements are Fluid and Coordinated: Yes Fine Motor Movements are Fluid and Coordinated: Yes Extremity Assessment RUE Assessment RUE Assessment: Within Functional Limits LUE Assessment LUE Assessment: Within Functional Limits Mobility  Bed Mobility Bed Mobility: Yes Supine to Sit: 6: Modified independent (Device/Increase time);HOB flat Transfers Sit to Stand: 5: Supervision;With upper extremity assist Sit to Stand Details (indicate cue type and reason): Cues for hand placement. Stand to Sit: 5: Supervision;With upper extremity assist Stand to Sit Details: controlled descent; cues for hand placement EEnd of Session OT - End of Session Activity Tolerance: Patient tolerated treatment well Patient left: in chair General Behavior During Session: [facility] for tasks performed Cognition: [facility] for tasks performed   Larsen Dungan, [place] Kung 02/10/2012, 2:20 PM

## 2012-02-10 NOTE — Progress Notes (Signed)
Pt was given d/c instructions. Pt took home walker and is aware that [facility] will deliver potty chair this week. Pt and family aware of [facility] services. Pt educated on new medications and how to take them. No questions at this time. Pt IV removed with no difficulties, tip intact. No needs at this time. VSS. Ramond Craver, Rn

## 2012-02-10 NOTE — Progress Notes (Signed)
ANTICOAGULATION CONSULT NOTE - Follow Up Consult  Pharmacy Consult for Coumadin Indication: atrial fibrillation, hx TIA  Allergies  Allergen Reactions  . Levaquin Rash    "thought they were sticking swords thru me"    Patient Measurements: Height: 6' (182.9 cm) Weight: 181 lb 6.4 oz (82.283 kg) IBW/kg (Calculated) : 77.6   Vital Signs: Temp: 97.8 F (36.6 C) (03/19 0800) Temp src: Oral (03/19 0800) BP: 159/89 mmHg (03/19 0800) Pulse Rate: 97  (03/19 0800)  Labs:  Basename 02/10/12 0530 02/09/12 0710 02/08/12 0718 02/07/12 1245 02/07/12 1055  HGB -- 11.9* 11.6* -- --  HCT -- 34.8* 33.4* -- 36.9*  PLT -- 143* 99* -- 100*  APTT -- -- -- -- --  LABPROT 38.9* 33.1* 26.6* -- --  INR 3.91* 3.18* 2.41* -- --  HEPARINUNFRC -- -- -- -- --  CREATININE -- 0.67 0.75 -- 0.93  CKTOTAL -- -- -- 39 --  CKMB -- -- -- 1.8 --  TROPONINI -- -- -- <0.30 --   Estimated Creatinine Clearance: 75.4 ml/min (by C-G formula based on Cr of 0.67).  Assessment: Admitted with diarrhea, generalized weakness, hyponatremia,dehydration, thrombocytopenia. Rash noted on trunk and arms, denies itching. Patient reported falling at home, has laceration on forehead. Head CT negative for bleed.  Coumadin for h/o afib, TIA. Home Coumadin regimen: 2.5 mg MWF; 5 mg TTSS. INR up to 3.91. No Coumadin yesterday. Thrombocytopenia improved to 143.  Infectious Disease - WBC 11.2, Afebrile, no abx. Cdiff PCR negative  Cardiovascular - CAD/afib/HTN: Max BP 162/82    Meds: Dig, Lipitor,Toprol  Endocrinology - TSH 9.388. Home Synthroid dose increased from 75 to 100 mcg/d. On IV HC for rash. Was on Prednisone at home for asthma vs adrenal insuff.. Holding home Methotrexate as likely cause of rash.  Pulm: asthma  Gastrointestinal /Nutrition - diarrhea - Cdiff negative. Gastroenteritis?  Neurology - no issues  Nephrology - Scr 0.67  Hematology / Oncology -Watch thrombocytopenia on admission.  Home meds not  resumed: ASA, OscalD, Santyl Oint, lasix, Eucerin, MTX (rash reaction), Niacin,   Goal of Therapy:  INR 2-3   Plan:    No Coumadin again today.  Pasty Spillers, PharmD 02/10/2012,9:52 AM

## 2012-02-11 ENCOUNTER — Telehealth: Payer: Self-pay

## 2012-02-11 DIAGNOSIS — R269 Unspecified abnormalities of gait and mobility: Secondary | ICD-10-CM | POA: Diagnosis not present

## 2012-02-11 DIAGNOSIS — Z9181 History of falling: Secondary | ICD-10-CM | POA: Diagnosis not present

## 2012-02-11 DIAGNOSIS — I10 Essential (primary) hypertension: Secondary | ICD-10-CM | POA: Diagnosis not present

## 2012-02-11 DIAGNOSIS — Z483 Aftercare following surgery for neoplasm: Secondary | ICD-10-CM | POA: Diagnosis not present

## 2012-02-11 DIAGNOSIS — I2510 Atherosclerotic heart disease of native coronary artery without angina pectoris: Secondary | ICD-10-CM | POA: Diagnosis not present

## 2012-02-11 DIAGNOSIS — R5383 Other fatigue: Secondary | ICD-10-CM | POA: Diagnosis not present

## 2012-02-11 NOTE — Telephone Encounter (Signed)
Per Vernona Rieger pt has been getting his coumadin checked at the [facility] and Vernona Rieger wanted an order for [facility] to do the PT/INR.    Per Dr. Caryl Never if pt is able to make it to the [facility] pt should keep the appointment.  Vernona Rieger aware.

## 2012-02-12 DIAGNOSIS — Z9181 History of falling: Secondary | ICD-10-CM | POA: Diagnosis not present

## 2012-02-12 DIAGNOSIS — I2510 Atherosclerotic heart disease of native coronary artery without angina pectoris: Secondary | ICD-10-CM | POA: Diagnosis not present

## 2012-02-12 DIAGNOSIS — R269 Unspecified abnormalities of gait and mobility: Secondary | ICD-10-CM | POA: Diagnosis not present

## 2012-02-12 DIAGNOSIS — R5383 Other fatigue: Secondary | ICD-10-CM | POA: Diagnosis not present

## 2012-02-12 DIAGNOSIS — Z483 Aftercare following surgery for neoplasm: Secondary | ICD-10-CM | POA: Diagnosis not present

## 2012-02-12 DIAGNOSIS — I10 Essential (primary) hypertension: Secondary | ICD-10-CM | POA: Diagnosis not present

## 2012-02-13 ENCOUNTER — Ambulatory Visit (INDEPENDENT_AMBULATORY_CARE_PROVIDER_SITE_OTHER): Payer: Medicare Other | Admitting: *Deleted

## 2012-02-13 DIAGNOSIS — Z7901 Long term (current) use of anticoagulants: Secondary | ICD-10-CM | POA: Diagnosis not present

## 2012-02-13 DIAGNOSIS — Z8679 Personal history of other diseases of the circulatory system: Secondary | ICD-10-CM

## 2012-02-13 DIAGNOSIS — I4891 Unspecified atrial fibrillation: Secondary | ICD-10-CM

## 2012-02-13 LAB — STOOL CULTURE

## 2012-02-13 NOTE — Discharge Instructions (Signed)
Abdominal Pain (Nonspecific)  Your exam might not show the exact reason you have abdominal pain. Since there are many different causes of abdominal pain, another checkup and more tests may be needed. It is very important to follow up for lasting (persistent) or worsening symptoms. A possible cause of abdominal pain in any person who still has his or her appendix is acute appendicitis. Appendicitis is often hard to diagnose. Normal blood tests, urine tests, ultrasound, and CT scans do not completely rule out early appendicitis or other causes of abdominal pain. Sometimes, only the changes that happen over time will allow appendicitis and other causes of abdominal pain to be determined. Other potential problems that may require surgery may also take time to become more apparent. Because of this, it is important that you follow all of the instructions below.  HOME CARE INSTRUCTIONS    Rest as much as possible.   Do not eat solid food until your pain is gone.   While adults or children have pain: A diet of water, weak decaffeinated tea, broth or bouillon, gelatin, oral rehydration solutions (ORS), frozen ice pops, or ice chips may be helpful.   When pain is gone in adults or children: Start a light diet (dry toast, crackers, applesauce, or white rice). Increase the diet slowly as long as it does not bother you. Eat no dairy products (including cheese and eggs) and no spicy, fatty, fried, or high-fiber foods.   Use no alcohol, caffeine, or cigarettes.   Take your regular medicines unless your caregiver told you not to.   Take any prescribed medicine as directed.   Only take over-the-counter or prescription medicines for pain, discomfort, or fever as directed by your caregiver. Do not give aspirin to children.  If your caregiver has given you a follow-up appointment, it is very important to keep that appointment. Not keeping the appointment could result in a permanent injury and/or lasting (chronic) pain and/or  disability. If there is any problem keeping the appointment, you must call to reschedule.   SEEK IMMEDIATE MEDICAL CARE IF:    Your pain is not gone in 24 hours.   Your pain becomes worse, changes location, or feels different.   You or your child has an oral temperature above 102 F (38.9 C), not controlled by medicine.   Your baby is older than 3 months with a rectal temperature of 102 F (38.9 C) or higher.   Your baby is 3 months old or younger with a rectal temperature of 100.4 F (38 C) or higher.   You have shaking chills.   You keep throwing up (vomiting) or cannot drink liquids.   There is blood in your vomit or you see blood in your bowel movements.   Your bowel movements become dark or black.   You have frequent bowel movements.   Your bowel movements stop (become blocked) or you cannot pass gas.   You have bloody, frequent, or painful urination.   You have yellow discoloration in the skin or whites of the eyes.   Your stomach becomes bloated or bigger.   You have dizziness or fainting.   You have chest or back pain.  MAKE SURE YOU:    Understand these instructions.   Will watch your condition.   Will get help right away if you are not doing well or get worse.  Document Released: 11/10/2005 Document Revised: 10/30/2011 Document Reviewed: 10/08/2009  Exit[employer] Patient Information 2012 [employer].

## 2012-02-16 ENCOUNTER — Ambulatory Visit: Payer: Medicare Other | Admitting: Family Medicine

## 2012-02-16 ENCOUNTER — Encounter: Payer: Self-pay | Admitting: Family Medicine

## 2012-02-16 ENCOUNTER — Ambulatory Visit (INDEPENDENT_AMBULATORY_CARE_PROVIDER_SITE_OTHER): Payer: Medicare Other | Admitting: Family Medicine

## 2012-02-16 VITALS — BP 102/62 | Temp 97.5°F | Wt 183.0 lb

## 2012-02-16 DIAGNOSIS — Z7901 Long term (current) use of anticoagulants: Secondary | ICD-10-CM | POA: Diagnosis not present

## 2012-02-16 DIAGNOSIS — I4891 Unspecified atrial fibrillation: Secondary | ICD-10-CM

## 2012-02-16 DIAGNOSIS — R1013 Epigastric pain: Secondary | ICD-10-CM

## 2012-02-16 DIAGNOSIS — Z8679 Personal history of other diseases of the circulatory system: Secondary | ICD-10-CM

## 2012-02-16 LAB — POCT INR: INR: 2.9

## 2012-02-16 NOTE — Patient Instructions (Addendum)
complete fat-free diet  Hold the niacin  Continue your other medications as outlined  We will get you set for a GI consult for further evaluation     Latest dosing instructions   Total Sun Mon Tue Wed Thu Fri Sat   25 2.5 mg 2.5 mg 5 mg 2.5 mg 5 mg 2.5 mg 5 mg    (5 mg0.5) (5 mg0.5) (5 mg1) (5 mg0.5) (5 mg1) (5 mg0.5) (5 mg1)

## 2012-02-16 NOTE — Progress Notes (Signed)
  Subjective:    Patient ID: [name] [name], male    DOB: 21-Mar-1925, 78 y.o.   MRN: [id number]  HPI [name] [name] is an 78 year old married male nonsmoker who comes in today accompanied by his [employer] person who drives him to the office for followup of an admission to the hospital for acute abdominal pain and diarrhea  There is an admission H&P but no discharge summary. He states he was hospitalized for 4 days they did a blood test pseudomature what they did and they discharged him for followup here. He states he still having abdominal cramps and diarrhea. No fever no vomiting urinating normally. Some of his medications were held thinking his symptoms might be a medication-related phenomena.   Review of Systems    GI review of systems otherwise negative he has had a colonoscopy in the past however he does not recall who did Objective:   Physical Exam Well-developed and nourished male no acute distress abdominal exam negative       Assessment & Plan:  Nausea with diarrhea,,,,,,,,,, probably one of the prolonged prototype viruses however because of his other problems will request a GI consult

## 2012-02-17 DIAGNOSIS — Z483 Aftercare following surgery for neoplasm: Secondary | ICD-10-CM | POA: Diagnosis not present

## 2012-02-17 DIAGNOSIS — I10 Essential (primary) hypertension: Secondary | ICD-10-CM | POA: Diagnosis not present

## 2012-02-17 DIAGNOSIS — I2510 Atherosclerotic heart disease of native coronary artery without angina pectoris: Secondary | ICD-10-CM | POA: Diagnosis not present

## 2012-02-17 DIAGNOSIS — R5381 Other malaise: Secondary | ICD-10-CM | POA: Diagnosis not present

## 2012-02-17 DIAGNOSIS — Z9181 History of falling: Secondary | ICD-10-CM | POA: Diagnosis not present

## 2012-02-17 DIAGNOSIS — R269 Unspecified abnormalities of gait and mobility: Secondary | ICD-10-CM | POA: Diagnosis not present

## 2012-02-19 DIAGNOSIS — R5381 Other malaise: Secondary | ICD-10-CM | POA: Diagnosis not present

## 2012-02-19 DIAGNOSIS — I2510 Atherosclerotic heart disease of native coronary artery without angina pectoris: Secondary | ICD-10-CM | POA: Diagnosis not present

## 2012-02-19 DIAGNOSIS — R609 Edema, unspecified: Secondary | ICD-10-CM | POA: Diagnosis not present

## 2012-02-19 DIAGNOSIS — I10 Essential (primary) hypertension: Secondary | ICD-10-CM | POA: Diagnosis not present

## 2012-02-19 DIAGNOSIS — Z483 Aftercare following surgery for neoplasm: Secondary | ICD-10-CM | POA: Diagnosis not present

## 2012-02-19 DIAGNOSIS — R269 Unspecified abnormalities of gait and mobility: Secondary | ICD-10-CM | POA: Diagnosis not present

## 2012-02-19 DIAGNOSIS — Z9181 History of falling: Secondary | ICD-10-CM | POA: Diagnosis not present

## 2012-02-19 DIAGNOSIS — M069 Rheumatoid arthritis, unspecified: Secondary | ICD-10-CM | POA: Diagnosis not present

## 2012-02-20 DIAGNOSIS — R269 Unspecified abnormalities of gait and mobility: Secondary | ICD-10-CM | POA: Diagnosis not present

## 2012-02-20 DIAGNOSIS — R5383 Other fatigue: Secondary | ICD-10-CM | POA: Diagnosis not present

## 2012-02-20 DIAGNOSIS — Z483 Aftercare following surgery for neoplasm: Secondary | ICD-10-CM | POA: Diagnosis not present

## 2012-02-20 DIAGNOSIS — Z9181 History of falling: Secondary | ICD-10-CM | POA: Diagnosis not present

## 2012-02-20 DIAGNOSIS — I10 Essential (primary) hypertension: Secondary | ICD-10-CM | POA: Diagnosis not present

## 2012-02-20 DIAGNOSIS — I2510 Atherosclerotic heart disease of native coronary artery without angina pectoris: Secondary | ICD-10-CM | POA: Diagnosis not present

## 2012-02-23 DIAGNOSIS — I10 Essential (primary) hypertension: Secondary | ICD-10-CM | POA: Diagnosis not present

## 2012-02-23 DIAGNOSIS — R5381 Other malaise: Secondary | ICD-10-CM | POA: Diagnosis not present

## 2012-02-23 DIAGNOSIS — I2510 Atherosclerotic heart disease of native coronary artery without angina pectoris: Secondary | ICD-10-CM | POA: Diagnosis not present

## 2012-02-23 DIAGNOSIS — Z483 Aftercare following surgery for neoplasm: Secondary | ICD-10-CM | POA: Diagnosis not present

## 2012-02-23 DIAGNOSIS — R269 Unspecified abnormalities of gait and mobility: Secondary | ICD-10-CM | POA: Diagnosis not present

## 2012-02-23 DIAGNOSIS — Z9181 History of falling: Secondary | ICD-10-CM | POA: Diagnosis not present

## 2012-02-25 DIAGNOSIS — Z9181 History of falling: Secondary | ICD-10-CM | POA: Diagnosis not present

## 2012-02-25 DIAGNOSIS — Z483 Aftercare following surgery for neoplasm: Secondary | ICD-10-CM | POA: Diagnosis not present

## 2012-02-25 DIAGNOSIS — I10 Essential (primary) hypertension: Secondary | ICD-10-CM | POA: Diagnosis not present

## 2012-02-25 DIAGNOSIS — R5381 Other malaise: Secondary | ICD-10-CM | POA: Diagnosis not present

## 2012-02-25 DIAGNOSIS — R269 Unspecified abnormalities of gait and mobility: Secondary | ICD-10-CM | POA: Diagnosis not present

## 2012-02-25 DIAGNOSIS — I2510 Atherosclerotic heart disease of native coronary artery without angina pectoris: Secondary | ICD-10-CM | POA: Diagnosis not present

## 2012-02-26 DIAGNOSIS — Z0279 Encounter for issue of other medical certificate: Secondary | ICD-10-CM

## 2012-02-26 NOTE — Discharge Summary (Signed)
Hospital Discharge Note  Name: [name] [name] MRN: [id number] DOB: Sep 26, 1925 78 y.o.  Date of Admission: 02/06/2012 11:46 PM Date of Discharge: 02/10/12 Attending Physician: No att. providers found  Discharge Diagnosis: Active Problems:  HYPERLIPIDEMIA  HYPERTENSION  CORONARY ARTERY DISEASE  Atrial fibrillation  Anemia  Diarrhea  Weakness generalized  Hyponatremia  Dehydration  Thrombocytopenia  Adrenal insufficiency   Discharge Medications: Medication List  As of 02/26/2012  9:36 AM   STOP taking these medications         acetaminophen 325 MG tablet      CENTRUM SILVER PO      HYDROcodone-homatropine 5-1.5 MG/5ML syrup      methotrexate 2.5 MG tablet      niacin 1000 MG CR tablet      ondansetron 4 MG tablet      predniSONE 20 MG tablet         TAKE these medications         aspirin 81 MG tablet   Take 81 mg by mouth at bedtime.      atorvastatin 10 MG tablet   Commonly known as: LIPITOR   Take 10 mg by mouth at bedtime.      CALTRATE 600 PLUS-VIT D PO   Take 1 tablet by mouth at bedtime.      collagenase ointment   Commonly known as: SANTYL   Apply 1 application topically daily.      dicyclomine 10 MG capsule   Commonly known as: BENTYL   Take 1 capsule (10 mg total) by mouth every 6 (six) hours as needed (bowel spasm, abdominal pain).      digoxin 0.125 MG tablet   Commonly known as: LANOXIN   Take 125 mcg by mouth daily.      furosemide 10 MG/ML solution   Commonly known as: LASIX   Take 10 mg by mouth daily. 10 mg at noon      hydrocerin Crea   Apply 1 application topically daily. Apply topically 2 (two) times daily. Apply to dry skin of bil. LE and feet, but not between toes      HYDROcodone-acetaminophen 5-500 MG per tablet   Commonly known as: VICODIN   Take 1-2 tablets by mouth every 6 (six) hours as needed for pain.      levothyroxine 100 MCG tablet   Commonly known as: SYNTHROID, LEVOTHROID   Take 1 tablet (100 mcg total) by  mouth daily before breakfast.      metoprolol succinate 50 MG 24 hr tablet   Commonly known as: TOPROL-XL   Take 50 mg by mouth 2 (two) times daily.      nitroGLYCERIN 0.4 MG SL tablet   Commonly known as: NITROSTAT   Place 0.4 mg under the tongue every 5 (five) minutes as needed. For chest pain      traMADol 50 MG tablet   Commonly known as: ULTRAM   Take 50 mg by mouth every 8 (eight) hours as needed. For pain      warfarin 5 MG tablet   Commonly known as: COUMADIN   Take 2.5-5 mg by mouth daily at 6 PM. Mondays, Wednesday, and Fridays only pt takes a half tab (2.5mg ) All other days 1 tab (5mg )            Disposition and follow-up:   Mr.[name] [name] was discharged from [facility] in Stable condition.    Follow-up Appointments: Follow-up Information    Follow up with [name]  Freida Busman, MD .        Discharge Orders    Future Appointments: Provider: Department: Dept Phone: Center:   02/27/2012 2:45 PM [facility] [phone] None   05/18/2012 1:00 PM [facility] [phone] [facility]   05/18/2012 2:00 PM [name] Ochoa, MD [facility] [phone] [facility]     Future Orders Please Complete By Expires   Diet - low sodium heart healthy      Increase activity slowly      Discharge instructions      Comments:   Use a Walker to get around until you get your strength back for safety.    Discharge wound care:      Comments:   Apply neosporin to head laceration and a band-aid daily.   (HEART FAILURE PATIENTS) Call MD:  Anytime you have any of the following symptoms: 1) 3 pound weight gain in 24 hours or 5 pounds in 1 week 2) shortness of breath, with or without a dry hacking cough 3) swelling in the hands, feet or stomach 4) if you have to sleep on extra pillows at night in order to breathe.      Call MD for:  extreme fatigue      Call MD for:  persistant dizziness or light-headedness      Call MD for:  hives       Call MD for:  difficulty breathing, headache or visual disturbances      Call MD for:  severe uncontrolled pain      Call MD for:  persistant nausea and vomiting      Call MD for:  temperature >100.4         Consultations:    Procedures Performed:  Dg Chest 2 View  02/05/2012  *RADIOLOGY REPORT*  Clinical Data: Chest and upper abdomen pain  CHEST - 2 VIEW  Comparison: Chest x-ray of 12/01/2011  Findings: No active infiltrate or effusion is seen.  The previously noted pleural effusions have resolved.  Mild cardiomegaly is stable.  There are degenerative changes throughout the mid to lower thoracic spine.  IMPRESSION: No active lung disease.  Resolution of previously noted effusions.  Original Report Authenticated By: [name] [name], M.D.   Ct Head Wo Contrast  02/07/2012  *RADIOLOGY REPORT*  Clinical Data: Fall with calcaneal fracture.  Weakness.  CT HEAD WITHOUT CONTRAST  Technique:  Contiguous axial images were obtained from the base of the skull through the vertex without contrast.  Comparison: CT head without contrast 11/26/2011.  Findings: Remote infarcts of the right caudate head and frontal lobe are stable.  Moderate generalized atrophy and white matter disease is similar to the prior exam.  No acute cortical infarct, hemorrhage, or mass lesion is present.  Minimal posterior right ethmoid sinus disease is evident.  The paranasal sinuses and mastoid air cells are otherwise clear.  IMPRESSION:  1.  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 2.  Atherosclerosis. 3.  Stable atrophy and chronic white matter disease. 4.  No acute intracranial abnormality or significant interval change.  Original Report Authenticated By: [name] [name]. [name] [name], M.D.   Ct Abdomen Pelvis W Contrast  02/05/2012  *RADIOLOGY REPORT*  Clinical Data: Abdominal pain and distention.  CT ABDOMEN AND PELVIS WITH CONTRAST  Technique:  Multidetector CT imaging of the abdomen and pelvis was performed  following the standard protocol during bolus administration of intravenous contrast.  Contrast: OMNIPAQUE IOHEXOL 300 MG/ML IJ SOLN  Comparison: None.  Findings: Probable tiny sub-centimeter cyst noted in the left hepatic lobe, however the abdominal parenchymal organs are otherwise unremarkable in appearance.  No soft tissue masses are identified.  Gallbladder is unremarkable.  No evidence of hydronephrosis.  No lymphadenopathy identified within the abdomen or pelvis.  Atherosclerotic calcification of the abdominal aorta and iliac arteries is seen, without evidence of aneurysm.  No evidence of inflammatory process or abnormal fluid collections.  No evidence of bowel wall thickening or dilatation.  IMPRESSION: No acute findings or other significant abnormality identified.  Original Report Authenticated By: Danae [name], M.D.   Admission HPI:  78 yo with PMH sig CAD, AFIB and HTN admitted with weakness, diarrhea. His in home caregiver from [employer] found him lying in his stool and urine, too weak to get himself to the bathroom. IN ED he was found to be dehydrated, in A-Fib, and continuing to have loose stools. He had normal abdominal w/u on 3/14 in ED but has not improved since then. Also reports that he fell at home-he has a bandage on his forehead and a skin lac.   Hospital Course by problem list: Active Problems:  HYPERLIPIDEMIA  HYPERTENSION  CORONARY ARTERY DISEASE  Atrial fibrillation  Anemia  Diarrhea  Weakness generalized  Hyponatremia  Dehydration  Thrombocytopenia  Adrenal insufficiency   The differential was broad for his presentation and still no clear etiology for his colitis found on CT non-diagnostic of bowel wall thickening or inflammation. His   Diarrhea/Abd Pain resolved with conservative measures.  Dehydration/Weakness-from NV improved with IV fluids, was eating at d/c.  Hyponatremia-ADRENAL INSUFF driven   Thombocytopenia, Mild elevation in AST -  methotrexate toxicity patient advised to stop this medication.  Fall at home, Head laceration-AI related due to intravascualr volume depletion  NEEDED STRESS DOSE STEROIDS  Rheumatoid Arthritis on Methotrexate-discontinued needs to see rhuematology   Papular rash few days probably from methotrexate   Hypothyroidism- TSH still high, need to adjust replacement therapy   **Suspect Adrenal Insufficiency and Methotrexate side effects were a big part of his hospital problems.   Discharge Vitals:  BP 145/81  Pulse 86  Temp(Src) 98.7 F (37.1 C) (Oral)  Resp 18  Ht 6' (1.829 m)  Wt 82.283 kg (181 lb 6.4 oz)  BMI 24.60 kg/m2  SpO2 96%  Discharge Labs: No results found for this or any previous visit (from the past 24 hour(s)).  SignedAnderson Malta 02/26/2012, 9:36 AM

## 2012-02-27 DIAGNOSIS — I2510 Atherosclerotic heart disease of native coronary artery without angina pectoris: Secondary | ICD-10-CM | POA: Diagnosis not present

## 2012-02-27 DIAGNOSIS — R5381 Other malaise: Secondary | ICD-10-CM | POA: Diagnosis not present

## 2012-02-27 DIAGNOSIS — R269 Unspecified abnormalities of gait and mobility: Secondary | ICD-10-CM | POA: Diagnosis not present

## 2012-02-27 DIAGNOSIS — Z9181 History of falling: Secondary | ICD-10-CM | POA: Diagnosis not present

## 2012-02-27 DIAGNOSIS — Z483 Aftercare following surgery for neoplasm: Secondary | ICD-10-CM | POA: Diagnosis not present

## 2012-02-27 DIAGNOSIS — I10 Essential (primary) hypertension: Secondary | ICD-10-CM | POA: Diagnosis not present

## 2012-03-02 DIAGNOSIS — Z9181 History of falling: Secondary | ICD-10-CM | POA: Diagnosis not present

## 2012-03-02 DIAGNOSIS — Z483 Aftercare following surgery for neoplasm: Secondary | ICD-10-CM | POA: Diagnosis not present

## 2012-03-02 DIAGNOSIS — I2510 Atherosclerotic heart disease of native coronary artery without angina pectoris: Secondary | ICD-10-CM | POA: Diagnosis not present

## 2012-03-02 DIAGNOSIS — R269 Unspecified abnormalities of gait and mobility: Secondary | ICD-10-CM | POA: Diagnosis not present

## 2012-03-02 DIAGNOSIS — R5383 Other fatigue: Secondary | ICD-10-CM | POA: Diagnosis not present

## 2012-03-02 DIAGNOSIS — I10 Essential (primary) hypertension: Secondary | ICD-10-CM | POA: Diagnosis not present

## 2012-03-10 ENCOUNTER — Ambulatory Visit (INDEPENDENT_AMBULATORY_CARE_PROVIDER_SITE_OTHER): Payer: Medicare Other | Admitting: Pharmacist

## 2012-03-10 DIAGNOSIS — Z8679 Personal history of other diseases of the circulatory system: Secondary | ICD-10-CM

## 2012-03-10 DIAGNOSIS — Z7901 Long term (current) use of anticoagulants: Secondary | ICD-10-CM

## 2012-03-10 DIAGNOSIS — I4891 Unspecified atrial fibrillation: Secondary | ICD-10-CM | POA: Diagnosis not present

## 2012-03-10 DIAGNOSIS — Z85828 Personal history of other malignant neoplasm of skin: Secondary | ICD-10-CM | POA: Diagnosis not present

## 2012-03-29 ENCOUNTER — Other Ambulatory Visit: Payer: Self-pay | Admitting: Family Medicine

## 2012-03-29 MED ORDER — PREDNISONE 10 MG PO TABS: 10.0000 mg | ORAL_TABLET | Freq: Every day | ORAL | Status: DC

## 2012-03-29 NOTE — Telephone Encounter (Signed)
Prednisone 10 mg,,,,,,,,, dispense 100 tablets directions 1 daily refills x2

## 2012-03-29 NOTE — Telephone Encounter (Signed)
Patient is using this for arthrititis. This replaced the methotrexate and is working well. Okay to fill?

## 2012-03-29 NOTE — Telephone Encounter (Signed)
Pt was prescibed hydrocortisone 10mg  Doc Anderson Malta at [facility]. [facility] sent [facility]

## 2012-03-29 NOTE — Telephone Encounter (Signed)
Is this okay to fill? 

## 2012-03-29 NOTE — Telephone Encounter (Signed)
Spoke with patient and Rx sent °

## 2012-03-30 ENCOUNTER — Other Ambulatory Visit: Payer: Self-pay | Admitting: Pharmacist

## 2012-03-30 DIAGNOSIS — M069 Rheumatoid arthritis, unspecified: Secondary | ICD-10-CM | POA: Diagnosis not present

## 2012-03-30 DIAGNOSIS — R609 Edema, unspecified: Secondary | ICD-10-CM | POA: Diagnosis not present

## 2012-03-30 DIAGNOSIS — L259 Unspecified contact dermatitis, unspecified cause: Secondary | ICD-10-CM | POA: Diagnosis not present

## 2012-03-30 DIAGNOSIS — E785 Hyperlipidemia, unspecified: Secondary | ICD-10-CM

## 2012-03-31 DIAGNOSIS — Z85820 Personal history of malignant melanoma of skin: Secondary | ICD-10-CM | POA: Diagnosis not present

## 2012-04-01 ENCOUNTER — Other Ambulatory Visit (INDEPENDENT_AMBULATORY_CARE_PROVIDER_SITE_OTHER): Payer: Medicare Other

## 2012-04-01 DIAGNOSIS — E785 Hyperlipidemia, unspecified: Secondary | ICD-10-CM | POA: Diagnosis not present

## 2012-04-01 LAB — LIPID PANEL
HDL: 45.7 mg/dL (ref >39.00)
LDL Cholesterol: 58 mg/dL (ref 0–99)
Total CHOL/HDL Ratio: 3
Triglycerides: 61 mg/dL (ref 0.0–149.0)
VLDL: 12.2 mg/dL (ref 0.0–40.0)

## 2012-04-01 LAB — HEPATIC FUNCTION PANEL
Albumin: 3.2 g/dL — ABNORMAL LOW (ref 3.5–5.2)
Total Bilirubin: 0.8 mg/dL (ref 0.3–1.2)

## 2012-04-07 ENCOUNTER — Ambulatory Visit: Payer: Medicare Other | Admitting: Pharmacist

## 2012-04-07 ENCOUNTER — Ambulatory Visit (INDEPENDENT_AMBULATORY_CARE_PROVIDER_SITE_OTHER): Payer: Medicare Other | Admitting: Pharmacist

## 2012-04-07 VITALS — Wt 194.0 lb

## 2012-04-07 DIAGNOSIS — I4891 Unspecified atrial fibrillation: Secondary | ICD-10-CM | POA: Diagnosis not present

## 2012-04-07 DIAGNOSIS — E785 Hyperlipidemia, unspecified: Secondary | ICD-10-CM

## 2012-04-07 DIAGNOSIS — Z7901 Long term (current) use of anticoagulants: Secondary | ICD-10-CM

## 2012-04-07 DIAGNOSIS — Z8679 Personal history of other diseases of the circulatory system: Secondary | ICD-10-CM | POA: Diagnosis not present

## 2012-04-07 LAB — POCT INR: INR: 2.1

## 2012-04-07 NOTE — Assessment & Plan Note (Addendum)
TC 116 (goal<200), TG 61 (goal<150), HDL 45.7 (goal>40) and LDL 58 (goal<70). LFTs are WNL.  Patient is at goal with all parameters.  Will continue current medications.  Was encouraged to continue eating the same lifestyle.  Given pt has been at goal for several follow up visits, will have him follow up with Dr. Daleen Squibb for further management of his cholesterol.

## 2012-04-07 NOTE — Patient Instructions (Signed)
Everything looks great and no changes are needed to your medications.     Your diet looks great, so continue doing what you are doing.  Will no longer need to return to the lipid clinic, Dr. Daleen Squibb will follow this for now on.

## 2012-04-07 NOTE — Progress Notes (Signed)
HPI  Patient presents to the clinic today for follow-up of lipids.  Currently taking Lipitor 10mg  every day.  Patient has no complaints of muscle pains or aches.  Reports compliance with all medications.    Diet: Patient appears to eat a well balanced meal.  Food is prepared by wife and children.   Breakfast: Cereal with some fruits, will occasionally have donuts.  Lunch: Will generally have a sandwich with ham or chicken.  Will sometimes have fruit salad with this as well as half a bottle of Gatorade.   Dinner: Has whatever daughter prepare.  Last night had spaghetti and will sometimes have a salad but this upsets his stomach   Exercise: Does not report very much exercise outside of taking care of his wife.  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    Current Outpatient Prescriptions  Medication Sig Dispense Refill  . aspirin 81 MG tablet Take 81 mg by mouth at bedtime.       Marland Kitchen atorvastatin (LIPITOR) 10 MG tablet Take 10 mg by mouth at bedtime.      . Calcium-Vitamin D (CALTRATE 600 PLUS-VIT D PO) Take 1 tablet by mouth at bedtime.       . dicyclomine (BENTYL) 10 MG capsule Take 1 capsule (10 mg total) by mouth every 6 (six) hours as needed (bowel spasm, abdominal pain).  15 capsule  0  . digoxin (LANOXIN) 0.125 MG tablet Take 125 mcg by mouth daily.      . furosemide (LASIX) 10 MG/ML solution Take 10 mg by mouth daily. 10 mg at noon      . furosemide (LASIX) 20 MG tablet Take 20 mg by mouth daily.      . hydrocerin (EUCERIN) CREA Apply 1 application topically daily. Apply topically 2 (two) times daily. Apply to dry skin of bil. LE and feet, but not between toes      . levothyroxine (SYNTHROID, LEVOTHROID) 100 MCG tablet Take 1 tablet (100 mcg total) by mouth daily before breakfast.  30 tablet  3  . metoprolol succinate (TOPROL-XL) 50 MG 24 hr tablet Take 50 mg by mouth 2 (two) times daily.      . nitroGLYCERIN (NITROSTAT) 0.4 MG SL tablet Place 0.4 mg under the tongue  every 5 (five) minutes as needed. For chest pain      . traMADol (ULTRAM) 50 MG tablet Take 50 mg by mouth every 8 (eight) hours as needed. For pain      . warfarin (COUMADIN) 5 MG tablet Take 2.5-5 mg by mouth daily at 6 PM. Mondays, Wednesday, and Fridays only pt takes a half tab (2.5mg ) All other days 1 tab (5mg )      . DISCONTD: diphenhydrAMINE (BENADRYL) 25 MG tablet Take 25 mg by mouth every 6 (six) hours as needed. For itching      . DISCONTD: methotrexate (RHEUMATREX) 2.5 MG tablet Take 10 mg by mouth once a week. Sunday        Allergies  Allergen Reactions  . Levofloxacin Rash    "thought they were sticking swords thru me"

## 2012-04-29 DIAGNOSIS — M069 Rheumatoid arthritis, unspecified: Secondary | ICD-10-CM | POA: Diagnosis not present

## 2012-04-29 DIAGNOSIS — Z79899 Other long term (current) drug therapy: Secondary | ICD-10-CM | POA: Diagnosis not present

## 2012-05-11 ENCOUNTER — Other Ambulatory Visit: Payer: Medicare Other

## 2012-05-11 ENCOUNTER — Ambulatory Visit: Payer: Medicare Other | Admitting: Vascular Surgery

## 2012-05-17 ENCOUNTER — Encounter: Payer: Self-pay | Admitting: Neurosurgery

## 2012-05-18 ENCOUNTER — Encounter: Payer: Self-pay | Admitting: Neurosurgery

## 2012-05-18 ENCOUNTER — Ambulatory Visit (INDEPENDENT_AMBULATORY_CARE_PROVIDER_SITE_OTHER): Payer: Medicare Other | Admitting: *Deleted

## 2012-05-18 ENCOUNTER — Ambulatory Visit (INDEPENDENT_AMBULATORY_CARE_PROVIDER_SITE_OTHER): Payer: Medicare Other | Admitting: Neurosurgery

## 2012-05-18 VITALS — BP 129/74 | HR 84 | Resp 16 | Ht 72.0 in | Wt 185.9 lb

## 2012-05-18 DIAGNOSIS — I6529 Occlusion and stenosis of unspecified carotid artery: Secondary | ICD-10-CM

## 2012-05-18 NOTE — Progress Notes (Signed)
[facility] Carotid Office Note  CC: Annual carotid duplex Referring Physician: Hart Rochester  History of Present Illness: 78 year old male patient of Dr. Hart Rochester he has known right total occlusion of the ICA. The patient denies any signs or symptoms of CVA, TIA, amaurosis fugax or any neural deficit. Patient also denies any new medical diagnoses or surgery. The patient states he is the primary caretaker for his wife who has early Alzheimer's.  Past Medical History  Diagnosis Date  . Carotid artery occlusion     right total internal artery occlusion  . Coronary artery disease     s/p cardiac cath in 1990s, and cardiolite study  in 2005 showing EF 54%  . Hyperlipidemia   . Hypertension   . TIA (transient ischemic attack)   . Degenerative disc disease     cervical spine  . Arrhythmia   . Atrial fibrillation 03/2011  . Myocardial infarction 1992 and 1994  . Angina     "jaw & elbow was the worse"  . Pneumonia ~ 1935  . GERD (gastroesophageal reflux disease)   . Skin cancer     forehead  . Urgency incontinence   . Bilateral cellulitis of lower leg     ROS: [x]  Positive   [ ]  Denies    General: [ ]  Weight loss, [ ]  Fever, [ ]  chills Neurologic: [ ]  Dizziness, [ ]  Blackouts, [ ]  Seizure [ ]  Stroke, [ ]  "Mini stroke", [ ]  Slurred speech, [ ]  Temporary blindness; [ ]  weakness in arms or legs, [ ]  Hoarseness Cardiac: [ ]  Chest pain/pressure, [ ]  Shortness of breath at rest [ ]  Shortness of breath with exertion, [ ]  Atrial fibrillation or irregular heartbeat Vascular: [ ]  Pain in legs with walking, [ ]  Pain in legs at rest, [ ]  Pain in legs at night,  [ ]  Non-healing ulcer, [ ]  Blood clot in vein/DVT,   Pulmonary: [ ]  Home oxygen, [ ]  Productive cough, [ ]  Coughing up blood, [ ]  Asthma,  [ ]  Wheezing Musculoskeletal:  [ ]  Arthritis, [ ]  Low back pain, [ ]  Joint pain Hematologic: [ ]  Easy Bruising, [ ]  Anemia; [ ]  Hepatitis Gastrointestinal: [ ]  Blood in stool, [  ] Gastroesophageal Reflux/heartburn, [ ]  Trouble swallowing Urinary: [ ]  chronic Kidney disease, [ ]  on HD - [ ]  MWF or [ ]  TTHS, [ ]  Burning with urination, [ ]  Difficulty urinating Skin: [ ]  Rashes, [ ]  Wounds Psychological: [ ]  Anxiety, [ ]  Depression   Social History History  Substance Use Topics  . Smoking status: Former Smoker -- 1.0 packs/day for 40 years    Types: Cigarettes  . Smokeless tobacco: Former Neurosurgeon    Quit date: 11/26/1990   Comment: quit around 1992  . Alcohol Use: No     former, quit drinking in 1992    Family History Family History  Problem Relation Age of Onset  . Heart disease Mother   . Malignant hyperthermia Mother   . Heart disease Father   . Malignant hyperthermia Father     Allergies  Allergen Reactions  . Levofloxacin Rash    "thought they were sticking swords thru me"    Current Outpatient Prescriptions  Medication Sig Dispense Refill  . aspirin 81 MG tablet Take 81 mg by mouth at bedtime.       Marland Kitchen atorvastatin (LIPITOR) 10 MG tablet Take 10 mg by mouth at bedtime.      . Calcium-Vitamin D (  CALTRATE 600 PLUS-VIT D PO) Take 1 tablet by mouth at bedtime.       . dicyclomine (BENTYL) 10 MG capsule Take 1 capsule (10 mg total) by mouth every 6 (six) hours as needed (bowel spasm, abdominal pain).  15 capsule  0  . digoxin (LANOXIN) 0.125 MG tablet Take 125 mcg by mouth daily.      . furosemide (LASIX) 10 MG/ML solution Take 10 mg by mouth daily. 10 mg at noon      . furosemide (LASIX) 20 MG tablet Take 20 mg by mouth daily.      . hydrocerin (EUCERIN) CREA Apply 1 application topically daily. Apply topically 2 (two) times daily. Apply to dry skin of bil. LE and feet, but not between toes      . levothyroxine (SYNTHROID, LEVOTHROID) 100 MCG tablet Take 1 tablet (100 mcg total) by mouth daily before breakfast.  30 tablet  3  . metoprolol succinate (TOPROL-XL) 50 MG 24 hr tablet Take 50 mg by mouth 2 (two) times daily.      . nitroGLYCERIN (NITROSTAT)  0.4 MG SL tablet Place 0.4 mg under the tongue every 5 (five) minutes as needed. For chest pain      . traMADol (ULTRAM) 50 MG tablet Take 50 mg by mouth every 8 (eight) hours as needed. For pain      . warfarin (COUMADIN) 5 MG tablet Take 2.5-5 mg by mouth daily at 6 PM. Mondays, Wednesday, and Fridays only pt takes a half tab (2.5mg ) All other days 1 tab (5mg )      . DISCONTD: diphenhydrAMINE (BENADRYL) 25 MG tablet Take 25 mg by mouth every 6 (six) hours as needed. For itching      . DISCONTD: methotrexate (RHEUMATREX) 2.5 MG tablet Take 10 mg by mouth once a week. Sunday        Physical Examination  Filed Vitals:   05/18/12 1349  BP: 129/74  Pulse:   Resp:     Body mass index is 25.21 kg/(m^2).  General:  WDWN in NAD Gait: Normal HEENT: WNL Eyes: Pupils equal Pulmonary: normal non-labored breathing , without Rales, rhonchi,  wheezing Cardiac: RRR, without  Murmurs, rubs or gallops; Abdomen: soft, NT, no masses Skin: no rashes, ulcers noted  Vascular Exam Pulses: 3+ radial pulses bilaterally Carotid bruits: Carotid pulse auscultated on the left, right occluded Extremities without ischemic changes, no Gangrene , no cellulitis; no open wounds;  Musculoskeletal: no muscle wasting or atrophy   Neurologic: A&O X 3; Appropriate Affect ; SENSATION: normal; MOTOR FUNCTION:  moving all extremities equally. Speech is fluent/normal  Non-Invasive Vascular Imaging CAROTID DUPLEX 05/18/2012  Right ICA 0ccluded stenosis Left ICA 20 - 39 % stenosis   ASSESSMENT/PLAN: Asymptomatic patient with a right occlusion, left ICA with mild to moderate stenosis. The patient return in one year for repeat carotid duplex and be seen in my clinic. The patient understands the signs and symptoms of CVA and knows to report to the nearest emergency department should that occur.  Lauree Chandler ANP   Clinic MD: Hart Rochester

## 2012-05-19 ENCOUNTER — Ambulatory Visit (INDEPENDENT_AMBULATORY_CARE_PROVIDER_SITE_OTHER): Payer: Medicare Other | Admitting: *Deleted

## 2012-05-19 DIAGNOSIS — Z8679 Personal history of other diseases of the circulatory system: Secondary | ICD-10-CM

## 2012-05-19 DIAGNOSIS — Z7901 Long term (current) use of anticoagulants: Secondary | ICD-10-CM | POA: Diagnosis not present

## 2012-05-19 DIAGNOSIS — I4891 Unspecified atrial fibrillation: Secondary | ICD-10-CM | POA: Diagnosis not present

## 2012-05-19 LAB — POCT INR: INR: 1.6

## 2012-05-19 NOTE — Addendum Note (Signed)
Addended by: Sharee Pimple on: 05/19/2012 09:42 AM   Modules accepted: Orders

## 2012-05-31 ENCOUNTER — Other Ambulatory Visit: Payer: Self-pay | Admitting: Internal Medicine

## 2012-05-31 NOTE — Procedures (Unsigned)
CAROTID DUPLEX EXAM  INDICATION:  Follow up left carotid disease; known right ICA occlusion.  HISTORY: Diabetes:  No. Cardiac:  Yes. Hypertension:  Yes. Smoking:  Previous. Previous Surgery: CV History: Amaurosis Fugax No, Paresthesias No, Hemiparesis No                                      RIGHT             LEFT Brachial systolic pressure:         129               141 Brachial Doppler waveforms: Vertebral direction of flow:                          Antegrade DUPLEX VELOCITIES (cm/sec) CCA peak systolic                                     97 ECA peak systolic                                     150 ICA peak systolic                                     117 ICA end diastolic                                     36 PLAQUE MORPHOLOGY:                                    Calcific PLAQUE AMOUNT:                                        Mild to moderate PLAQUE LOCATION:                                      ICA  IMPRESSION: 1. 1-39% left internal carotid artery stenosis by velocity criteria;     however, plaque is calcific and appears worse.  Velocities are     recorded just distal to the lesion. 2. Left vertebral artery is antegrade.  ___________________________________________ Quita Skye. Hart Rochester, M.D.  LT/MEDQ  D:  05/18/2012  T:  05/18/2012  Job:  [id number]

## 2012-06-07 DIAGNOSIS — Z79899 Other long term (current) drug therapy: Secondary | ICD-10-CM | POA: Diagnosis not present

## 2012-06-07 DIAGNOSIS — M069 Rheumatoid arthritis, unspecified: Secondary | ICD-10-CM | POA: Diagnosis not present

## 2012-06-10 ENCOUNTER — Other Ambulatory Visit: Payer: Self-pay | Admitting: Family Medicine

## 2012-06-10 DIAGNOSIS — D529 Folate deficiency anemia, unspecified: Secondary | ICD-10-CM | POA: Diagnosis not present

## 2012-06-10 DIAGNOSIS — M069 Rheumatoid arthritis, unspecified: Secondary | ICD-10-CM | POA: Diagnosis not present

## 2012-06-10 DIAGNOSIS — Z79899 Other long term (current) drug therapy: Secondary | ICD-10-CM | POA: Diagnosis not present

## 2012-06-11 ENCOUNTER — Ambulatory Visit (INDEPENDENT_AMBULATORY_CARE_PROVIDER_SITE_OTHER): Payer: Medicare Other

## 2012-06-11 DIAGNOSIS — Z7901 Long term (current) use of anticoagulants: Secondary | ICD-10-CM

## 2012-06-11 DIAGNOSIS — I4891 Unspecified atrial fibrillation: Secondary | ICD-10-CM | POA: Diagnosis not present

## 2012-06-11 DIAGNOSIS — Z8679 Personal history of other diseases of the circulatory system: Secondary | ICD-10-CM

## 2012-06-11 LAB — POCT INR: INR: 2.3

## 2012-06-14 DIAGNOSIS — C44320 Squamous cell carcinoma of skin of unspecified parts of face: Secondary | ICD-10-CM | POA: Diagnosis not present

## 2012-06-14 DIAGNOSIS — L570 Actinic keratosis: Secondary | ICD-10-CM | POA: Diagnosis not present

## 2012-06-14 DIAGNOSIS — L821 Other seborrheic keratosis: Secondary | ICD-10-CM | POA: Diagnosis not present

## 2012-06-14 DIAGNOSIS — D0439 Carcinoma in situ of skin of other parts of face: Secondary | ICD-10-CM | POA: Diagnosis not present

## 2012-06-14 DIAGNOSIS — Z85828 Personal history of other malignant neoplasm of skin: Secondary | ICD-10-CM | POA: Diagnosis not present

## 2012-06-24 ENCOUNTER — Telehealth: Payer: Self-pay | Admitting: Cardiology

## 2012-06-24 NOTE — Telephone Encounter (Signed)
New msg Pt is having extraction on wed and he had some questions about his coumadin

## 2012-06-24 NOTE — Telephone Encounter (Signed)
Telephoned pt and he is to have his extraction on 06/30/12. Dentist left it up to [place] about his coumadin therapy. Thus, appt s/c for 06/28/12 for INR check. Pt asked about med changes and he then verbalized that he has been on Prednisone.

## 2012-06-28 ENCOUNTER — Ambulatory Visit (INDEPENDENT_AMBULATORY_CARE_PROVIDER_SITE_OTHER): Payer: Medicare Other | Admitting: *Deleted

## 2012-06-28 DIAGNOSIS — Z7901 Long term (current) use of anticoagulants: Secondary | ICD-10-CM

## 2012-06-28 DIAGNOSIS — I4891 Unspecified atrial fibrillation: Secondary | ICD-10-CM

## 2012-06-28 DIAGNOSIS — Z8679 Personal history of other diseases of the circulatory system: Secondary | ICD-10-CM

## 2012-06-28 LAB — POCT INR: INR: 1.3

## 2012-07-12 ENCOUNTER — Ambulatory Visit (INDEPENDENT_AMBULATORY_CARE_PROVIDER_SITE_OTHER): Payer: Medicare Other | Admitting: *Deleted

## 2012-07-12 DIAGNOSIS — Z8679 Personal history of other diseases of the circulatory system: Secondary | ICD-10-CM

## 2012-07-12 DIAGNOSIS — I4891 Unspecified atrial fibrillation: Secondary | ICD-10-CM

## 2012-07-12 DIAGNOSIS — Z79899 Other long term (current) drug therapy: Secondary | ICD-10-CM | POA: Diagnosis not present

## 2012-07-12 DIAGNOSIS — M069 Rheumatoid arthritis, unspecified: Secondary | ICD-10-CM | POA: Diagnosis not present

## 2012-07-12 DIAGNOSIS — Z7901 Long term (current) use of anticoagulants: Secondary | ICD-10-CM

## 2012-07-12 LAB — POCT INR: INR: 1.5

## 2012-07-14 DIAGNOSIS — Z85828 Personal history of other malignant neoplasm of skin: Secondary | ICD-10-CM | POA: Diagnosis not present

## 2012-07-14 DIAGNOSIS — L570 Actinic keratosis: Secondary | ICD-10-CM | POA: Diagnosis not present

## 2012-07-14 DIAGNOSIS — L820 Inflamed seborrheic keratosis: Secondary | ICD-10-CM | POA: Diagnosis not present

## 2012-07-22 ENCOUNTER — Other Ambulatory Visit: Payer: Self-pay | Admitting: *Deleted

## 2012-07-22 ENCOUNTER — Ambulatory Visit (INDEPENDENT_AMBULATORY_CARE_PROVIDER_SITE_OTHER): Payer: Medicare Other | Admitting: *Deleted

## 2012-07-22 DIAGNOSIS — I4891 Unspecified atrial fibrillation: Secondary | ICD-10-CM | POA: Diagnosis not present

## 2012-07-22 DIAGNOSIS — Z8679 Personal history of other diseases of the circulatory system: Secondary | ICD-10-CM

## 2012-07-22 DIAGNOSIS — Z7901 Long term (current) use of anticoagulants: Secondary | ICD-10-CM | POA: Diagnosis not present

## 2012-07-22 MED ORDER — DIGOXIN 125 MCG PO TABS: 125.0000 ug | ORAL_TABLET | Freq: Every day | ORAL | Status: DC

## 2012-07-27 DIAGNOSIS — Z79899 Other long term (current) drug therapy: Secondary | ICD-10-CM | POA: Diagnosis not present

## 2012-07-27 DIAGNOSIS — D529 Folate deficiency anemia, unspecified: Secondary | ICD-10-CM | POA: Diagnosis not present

## 2012-07-27 DIAGNOSIS — M069 Rheumatoid arthritis, unspecified: Secondary | ICD-10-CM | POA: Diagnosis not present

## 2012-08-05 ENCOUNTER — Ambulatory Visit (INDEPENDENT_AMBULATORY_CARE_PROVIDER_SITE_OTHER): Payer: Medicare Other | Admitting: Pharmacist

## 2012-08-05 ENCOUNTER — Telehealth: Payer: Self-pay | Admitting: *Deleted

## 2012-08-05 DIAGNOSIS — Z8679 Personal history of other diseases of the circulatory system: Secondary | ICD-10-CM

## 2012-08-05 DIAGNOSIS — Z7901 Long term (current) use of anticoagulants: Secondary | ICD-10-CM | POA: Diagnosis not present

## 2012-08-05 DIAGNOSIS — I4891 Unspecified atrial fibrillation: Secondary | ICD-10-CM | POA: Diagnosis not present

## 2012-08-05 NOTE — Telephone Encounter (Signed)
Pt seen in Dr. Kellie Simmering office on 07/28/12. Ov report stated bp was 82/50 "... He will be in Dr. Vern Claude office next week & I've written down BP is 82/50 & it can possibly be checked again" Called [facility] as pt had coumadin clinic appt this afternoon. Blood pressure was rechecked.  Pt is due for follow-up with Dr. Daleen Squibb in November. See note below. Will forward to Dr. Daleen Squibb.  Mylo Red RN

## 2012-08-05 NOTE — Telephone Encounter (Signed)
Pt seen in [facility] today and B/P manual was 106/50. Automatic in Left 121/80, P-75 and in Rt arm 121/74. Palpable pulse is irregular.

## 2012-08-09 ENCOUNTER — Ambulatory Visit (INDEPENDENT_AMBULATORY_CARE_PROVIDER_SITE_OTHER): Payer: Medicare Other

## 2012-08-09 DIAGNOSIS — Z23 Encounter for immunization: Secondary | ICD-10-CM

## 2012-08-24 ENCOUNTER — Other Ambulatory Visit: Payer: Self-pay | Admitting: Cardiology

## 2012-08-26 ENCOUNTER — Ambulatory Visit (INDEPENDENT_AMBULATORY_CARE_PROVIDER_SITE_OTHER): Payer: Medicare Other | Admitting: *Deleted

## 2012-08-26 DIAGNOSIS — Z7901 Long term (current) use of anticoagulants: Secondary | ICD-10-CM | POA: Diagnosis not present

## 2012-08-26 DIAGNOSIS — Z8679 Personal history of other diseases of the circulatory system: Secondary | ICD-10-CM

## 2012-08-26 DIAGNOSIS — I4891 Unspecified atrial fibrillation: Secondary | ICD-10-CM | POA: Diagnosis not present

## 2012-09-06 ENCOUNTER — Other Ambulatory Visit: Payer: Self-pay | Admitting: Cardiology

## 2012-09-09 ENCOUNTER — Ambulatory Visit (INDEPENDENT_AMBULATORY_CARE_PROVIDER_SITE_OTHER): Payer: Medicare Other

## 2012-09-09 DIAGNOSIS — Z7901 Long term (current) use of anticoagulants: Secondary | ICD-10-CM

## 2012-09-09 DIAGNOSIS — I4891 Unspecified atrial fibrillation: Secondary | ICD-10-CM | POA: Diagnosis not present

## 2012-09-09 DIAGNOSIS — Z8679 Personal history of other diseases of the circulatory system: Secondary | ICD-10-CM | POA: Diagnosis not present

## 2012-09-13 ENCOUNTER — Ambulatory Visit (INDEPENDENT_AMBULATORY_CARE_PROVIDER_SITE_OTHER): Payer: Medicare Other | Admitting: Family Medicine

## 2012-09-13 ENCOUNTER — Encounter: Payer: Self-pay | Admitting: Family Medicine

## 2012-09-13 VITALS — BP 124/84 | Temp 98.4°F | Wt 186.0 lb

## 2012-09-13 DIAGNOSIS — J45901 Unspecified asthma with (acute) exacerbation: Secondary | ICD-10-CM

## 2012-09-13 DIAGNOSIS — IMO0001 Reserved for inherently not codable concepts without codable children: Secondary | ICD-10-CM

## 2012-09-13 MED ORDER — PREDNISONE 20 MG PO TABS: ORAL_TABLET | ORAL | Status: DC

## 2012-09-13 NOTE — Progress Notes (Signed)
  Subjective:    Patient ID: [name] [name], male    DOB: 02-18-1925, 78 y.o.   MRN: [id number]  HPI [name] [name] is a 78 year old male who comes in today with a week's history of head congestion postnasal drip sneezing and coughing. No fever no sputum production. He does have a history of allergic rhinitis however he is never had asthma before. He was able to sleep through the night.    Review of Systems    general and pulmonary review of systems otherwise negative Objective:   Physical Exam  Well-developed well-nourished male in no acute distress examination of the HEENT were negative except for postnasal drip neck was supple no adenopathy lungs are clear except for symmetrical mild late expiratory wheezing      Assessment & Plan:

## 2012-09-13 NOTE — Patient Instructions (Addendum)
Prednisone 20 mg,,,,,, 2 tabs x3 days, 1 tab x3 days, a half a tab x3 days, then a half a tablet Monday Wednesday Friday for a 2 week taper  Drink lots of water  Return in 2 weeks for followup

## 2012-09-21 ENCOUNTER — Encounter: Payer: Self-pay | Admitting: Cardiology

## 2012-09-27 ENCOUNTER — Ambulatory Visit (INDEPENDENT_AMBULATORY_CARE_PROVIDER_SITE_OTHER): Payer: Medicare Other | Admitting: Family Medicine

## 2012-09-27 ENCOUNTER — Encounter: Payer: Self-pay | Admitting: Family Medicine

## 2012-09-27 VITALS — BP 110/80 | Temp 98.6°F | Wt 188.0 lb

## 2012-09-27 DIAGNOSIS — D649 Anemia, unspecified: Secondary | ICD-10-CM | POA: Diagnosis not present

## 2012-09-27 MED ORDER — LEVOTHYROXINE SODIUM 100 MCG PO TABS: 100.0000 ug | ORAL_TABLET | Freq: Every day | ORAL | Status: DC

## 2012-09-27 NOTE — Patient Instructions (Addendum)
Prednisone,,,,,,,,,,,,, one half tablet Monday Wednesday Friday for a 3 week taper

## 2012-09-27 NOTE — Progress Notes (Signed)
  Subjective:    Patient ID: [name] [name], male    DOB: 1925-10-08, 78 y.o.   MRN: [id number]  HPI Orlanda is an 78 year old male married nonsmoker who comes in today for followup of asthma  We saw him a couple weeks ago with a flare of his asthma the asthma and start him on prednisone 2 tabs for 3 days and then a taper. He stand a half a tablet Monday Wednesday Friday and feels back to normal no cough no wheezing  Review of Systems    general and pulmonary review of systems otherwise negative Objective:   Physical Exam  Well-developed well-nourished male in no acute distress HEENT negative neck was supple no adenopathy lungs are clear no wheezing      Assessment & Plan:  Asthma resolved plan Taper prednisone slowly over 2 more weeks return when necessary

## 2012-09-30 ENCOUNTER — Ambulatory Visit (INDEPENDENT_AMBULATORY_CARE_PROVIDER_SITE_OTHER): Payer: Medicare Other | Admitting: Family

## 2012-09-30 DIAGNOSIS — M069 Rheumatoid arthritis, unspecified: Secondary | ICD-10-CM | POA: Diagnosis not present

## 2012-09-30 DIAGNOSIS — Z7901 Long term (current) use of anticoagulants: Secondary | ICD-10-CM | POA: Diagnosis not present

## 2012-09-30 DIAGNOSIS — I4891 Unspecified atrial fibrillation: Secondary | ICD-10-CM

## 2012-09-30 DIAGNOSIS — Z8679 Personal history of other diseases of the circulatory system: Secondary | ICD-10-CM

## 2012-09-30 DIAGNOSIS — Z79899 Other long term (current) drug therapy: Secondary | ICD-10-CM | POA: Diagnosis not present

## 2012-09-30 NOTE — Patient Instructions (Signed)
Continue on same dosage 1 pill everyday. Recheck in 4 weeks.     Latest dosing instructions   Total Sun Mon Tue Wed Thu Fri Sat   35 5 mg 5 mg 5 mg 5 mg 5 mg 5 mg 5 mg    (5 mg1) (5 mg1) (5 mg1) (5 mg1) (5 mg1) (5 mg1) (5 mg1)

## 2012-10-06 DIAGNOSIS — L820 Inflamed seborrheic keratosis: Secondary | ICD-10-CM | POA: Diagnosis not present

## 2012-10-06 DIAGNOSIS — L570 Actinic keratosis: Secondary | ICD-10-CM | POA: Diagnosis not present

## 2012-10-07 ENCOUNTER — Ambulatory Visit: Payer: Medicare Other | Admitting: Cardiology

## 2012-10-08 ENCOUNTER — Other Ambulatory Visit: Payer: Self-pay | Admitting: *Deleted

## 2012-10-08 MED ORDER — ATORVASTATIN CALCIUM 10 MG PO TABS: 10.0000 mg | ORAL_TABLET | Freq: Every day | ORAL | Status: DC

## 2012-10-27 ENCOUNTER — Ambulatory Visit (INDEPENDENT_AMBULATORY_CARE_PROVIDER_SITE_OTHER): Payer: Medicare Other | Admitting: Family

## 2012-10-27 DIAGNOSIS — Z8679 Personal history of other diseases of the circulatory system: Secondary | ICD-10-CM

## 2012-10-27 DIAGNOSIS — I4891 Unspecified atrial fibrillation: Secondary | ICD-10-CM

## 2012-10-27 DIAGNOSIS — Z7901 Long term (current) use of anticoagulants: Secondary | ICD-10-CM | POA: Diagnosis not present

## 2012-10-27 LAB — POCT INR: INR: 2.4

## 2012-10-27 NOTE — Patient Instructions (Addendum)
Continue on same dosage 1 pill everyday. Recheck in 6 weeks.    Latest dosing instructions   Total Sun Mon Tue Wed Thu Fri Sat   35 5 mg 5 mg 5 mg 5 mg 5 mg 5 mg 5 mg    (5 mg1) (5 mg1) (5 mg1) (5 mg1) (5 mg1) (5 mg1) (5 mg1)        

## 2012-10-28 DIAGNOSIS — D529 Folate deficiency anemia, unspecified: Secondary | ICD-10-CM | POA: Diagnosis not present

## 2012-10-28 DIAGNOSIS — M069 Rheumatoid arthritis, unspecified: Secondary | ICD-10-CM | POA: Diagnosis not present

## 2012-10-28 DIAGNOSIS — Z79899 Other long term (current) drug therapy: Secondary | ICD-10-CM | POA: Diagnosis not present

## 2012-11-23 DIAGNOSIS — Z79899 Other long term (current) drug therapy: Secondary | ICD-10-CM | POA: Diagnosis not present

## 2012-11-23 DIAGNOSIS — M069 Rheumatoid arthritis, unspecified: Secondary | ICD-10-CM | POA: Diagnosis not present

## 2012-12-08 ENCOUNTER — Ambulatory Visit (INDEPENDENT_AMBULATORY_CARE_PROVIDER_SITE_OTHER): Payer: Medicare Other | Admitting: Family

## 2012-12-08 DIAGNOSIS — Z8679 Personal history of other diseases of the circulatory system: Secondary | ICD-10-CM

## 2012-12-08 DIAGNOSIS — Z7901 Long term (current) use of anticoagulants: Secondary | ICD-10-CM | POA: Diagnosis not present

## 2012-12-08 DIAGNOSIS — I4891 Unspecified atrial fibrillation: Secondary | ICD-10-CM | POA: Diagnosis not present

## 2012-12-08 LAB — POCT INR: INR: 2.2

## 2012-12-08 NOTE — Patient Instructions (Addendum)
Continue on same dosage 1 pill everyday. Recheck in 6 weeks.    Latest dosing instructions   Total Sun Mon Tue Wed Thu Fri Sat   35 5 mg 5 mg 5 mg 5 mg 5 mg 5 mg 5 mg    (5 mg1) (5 mg1) (5 mg1) (5 mg1) (5 mg1) (5 mg1) (5 mg1)

## 2012-12-13 ENCOUNTER — Ambulatory Visit (INDEPENDENT_AMBULATORY_CARE_PROVIDER_SITE_OTHER): Payer: Medicare Other | Admitting: Family Medicine

## 2012-12-13 ENCOUNTER — Encounter: Payer: Self-pay | Admitting: Family Medicine

## 2012-12-13 VITALS — BP 140/80 | HR 115 | Temp 98.5°F | Wt 189.0 lb

## 2012-12-13 DIAGNOSIS — J45901 Unspecified asthma with (acute) exacerbation: Secondary | ICD-10-CM | POA: Diagnosis not present

## 2012-12-13 DIAGNOSIS — J449 Chronic obstructive pulmonary disease, unspecified: Secondary | ICD-10-CM

## 2012-12-13 DIAGNOSIS — J069 Acute upper respiratory infection, unspecified: Secondary | ICD-10-CM

## 2012-12-13 DIAGNOSIS — IMO0001 Reserved for inherently not codable concepts without codable children: Secondary | ICD-10-CM

## 2012-12-13 MED ORDER — HYDROCODONE-HOMATROPINE 5-1.5 MG/5ML PO SYRP: 5.0000 mL | ORAL_SOLUTION | Freq: Three times a day (TID) | ORAL | Status: DC | PRN

## 2012-12-13 NOTE — Patient Instructions (Signed)
Drink lots of water   humidifier in your bedroom

## 2012-12-13 NOTE — Progress Notes (Signed)
  Subjective:    Patient ID: [name] [name], male    DOB: April 30, 1925, 79 y.o.   MRN: [id number]  HPI [name] [name] is a 79 year old male married who comes in today with a four-day history of head congestion sore throat cough. No fever no sputum production. His wife is demented also has similar symptoms   Review of Systems    review of systems otherwise negative except for history of COPD and wheezing Objective:   Physical Exam Well-developed well-nourished male in no acute distress HEENT negative neck was supple no adenopathy lungs are clear       Assessment & Plan:  Viral syndrome plan treat symptomatically

## 2012-12-15 ENCOUNTER — Ambulatory Visit: Payer: Medicare Other | Admitting: Cardiology

## 2012-12-22 ENCOUNTER — Encounter: Payer: Self-pay | Admitting: Cardiology

## 2012-12-22 ENCOUNTER — Ambulatory Visit (INDEPENDENT_AMBULATORY_CARE_PROVIDER_SITE_OTHER): Payer: Medicare Other | Admitting: Cardiology

## 2012-12-22 VITALS — BP 128/66 | HR 80 | Ht 72.0 in | Wt 189.0 lb

## 2012-12-22 DIAGNOSIS — I6529 Occlusion and stenosis of unspecified carotid artery: Secondary | ICD-10-CM

## 2012-12-22 DIAGNOSIS — Z8679 Personal history of other diseases of the circulatory system: Secondary | ICD-10-CM

## 2012-12-22 DIAGNOSIS — I252 Old myocardial infarction: Secondary | ICD-10-CM | POA: Diagnosis not present

## 2012-12-22 DIAGNOSIS — I10 Essential (primary) hypertension: Secondary | ICD-10-CM | POA: Diagnosis not present

## 2012-12-22 DIAGNOSIS — I4891 Unspecified atrial fibrillation: Secondary | ICD-10-CM

## 2012-12-22 DIAGNOSIS — I2510 Atherosclerotic heart disease of native coronary artery without angina pectoris: Secondary | ICD-10-CM

## 2012-12-22 NOTE — Assessment & Plan Note (Signed)
Under good control. No change in medical therapy.

## 2012-12-22 NOTE — Patient Instructions (Addendum)
You will need to go to the [facility] on 12/29/12 for fasting lab work: lipid,cmp, cbc, and digoxin level.  We will call you with your results  Your physician wants you to follow-up in: 1 year. You will receive a reminder letter in the mail two months in advance. If you don't receive a letter, please call our office to schedule the follow-up appointment.  Your physician recommends that you continue on your current medications as directed. Please refer to the Current Medication list given to you today.

## 2012-12-22 NOTE — Progress Notes (Signed)
HPI [name] returns today for evaluation and management of his history of coronary artery disease, history of chronic A. fib, anticoagulation. He also has carotid artery disease which is followed by the vascular surgery group. He is scheduled for carotids this summer. They have been stable. He denies any symptoms of TIAs.  He still very active doing all of his yard work. He was scraping some snow this morning. He denies any chest pain, shortness of breath, palpitations presyncope or syncope. He's had no edema. He denies any bleeding or melena. He is overdue for blood work.  Past Medical History  Diagnosis Date  . Carotid artery occlusion     right total internal artery occlusion  . Coronary artery disease     s/p cardiac cath in 1990s, and cardiolite study  in 2005 showing EF 54%  . Hyperlipidemia   . Hypertension   . TIA (transient ischemic attack)   . Degenerative disc disease     cervical spine  . Arrhythmia   . Atrial fibrillation 03/2011  . Myocardial infarction 1992 and 1994  . Angina     "jaw & elbow was the worse"  . Pneumonia ~ 1935  . GERD (gastroesophageal reflux disease)   . Skin cancer     forehead  . Urgency incontinence   . Bilateral cellulitis of lower leg     Current Outpatient Prescriptions  Medication Sig Dispense Refill  . aspirin 81 MG tablet Take 81 mg by mouth at bedtime.       Marland Kitchen atorvastatin (LIPITOR) 10 MG tablet Take 1 tablet (10 mg total) by mouth at bedtime.  90 tablet  3  . Calcium-Vitamin D (CALTRATE 600 PLUS-VIT D PO) Take 1 tablet by mouth at bedtime.       . dicyclomine (BENTYL) 10 MG capsule TAKE ONE CAPSULE EVERY 6 HOURS AS NEEDED BOWEL SPASM/ABDOMINAL PAIN  15 capsule  0  . digoxin (LANOXIN) 0.125 MG tablet Take 1 tablet (125 mcg total) by mouth daily.  90 tablet  3  . furosemide (LASIX) 10 MG/ML solution Take 10 mg by mouth daily. 10 mg at noon      . furosemide (LASIX) 20 MG tablet Take 20 mg by mouth daily.      . hydrocerin (EUCERIN) CREA  Apply 1 application topically daily. Apply topically 2 (two) times daily. Apply to dry skin of bil. LE and feet, but not between toes      . HYDROcodone-homatropine (HYCODAN) 5-1.5 MG/5ML syrup Take 5 mLs by mouth every 8 (eight) hours as needed for cough.  240 mL  1  . levothyroxine (SYNTHROID, LEVOTHROID) 100 MCG tablet Take 1 tablet (100 mcg total) by mouth daily.  90 tablet  3  . methotrexate (RHEUMATREX) 2.5 MG tablet Take 2.5 mg by mouth 2 (two) times a week. Caution:Chemotherapy. Protect from light. Pt takes 3 tablets twice a week.      . metoprolol succinate (TOPROL-XL) 50 MG 24 hr tablet TAKE 1 TABLET BY MOUTH TWICE DAILY  180 tablet  1  . Multiple Vitamins-Minerals (CENTRUM SILVER PO) Take 1 tablet by mouth daily.      . nitroGLYCERIN (NITROSTAT) 0.4 MG SL tablet Place 0.4 mg under the tongue every 5 (five) minutes as needed. For chest pain      . predniSONE (DELTASONE) 20 MG tablet 2 tabs x3 days, 1 tab x3 days, a half a tab x3 days, then a half a tablet Monday Wednesday Friday for a 2 week taper  40 tablet  1  . traMADol (ULTRAM) 50 MG tablet Take 50 mg by mouth every 8 (eight) hours as needed. For pain      . warfarin (COUMADIN) 5 MG tablet Take as directed by coumadin clinic  90 tablet  1  . [DISCONTINUED] diphenhydrAMINE (BENADRYL) 25 MG tablet Take 25 mg by mouth every 6 (six) hours as needed. For itching      . [DISCONTINUED] methotrexate (RHEUMATREX) 2.5 MG tablet Take 10 mg by mouth once a week. Sunday        Allergies  Allergen Reactions  . Levofloxacin Rash    "thought they were sticking swords thru me"    Family History  Problem Relation Age of Onset  . Heart disease Mother   . Malignant hyperthermia Mother   . Heart disease Father   . Malignant hyperthermia Father     History   Social History  . Marital Status: Married    Spouse Name: N/A    Number of Children: N/A  . Years of Education: N/A   Occupational History  . Not on file.   Social History Main  Topics  . Smoking status: Former Smoker -- 1.0 packs/day for 40 years    Types: Cigarettes  . Smokeless tobacco: Former Neurosurgeon    Quit date: 11/26/1990     Comment: quit around 1992  . Alcohol Use: No     Comment: former, quit drinking in 1992  . Drug Use: No  . Sexually Active: Not Currently   Other Topics Concern  . 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    ROS ALL NEGATIVE EXCEPT THOSE NOTED IN HPI  PE  General Appearance: well developed, well nourished in no acute distress, looks younger than stated age HEENT: symmetrical face, PERRLA, good dentition  Neck: no JVD, thyromegaly, or adenopathy, trachea midline Chest: symmetric without deformity Cardiac: PMI non-displaced, RRR, normal S1, S2, no gallop or murmur Lung: clear to ausculation and percussion Vascular: all pulses full, bilateral carotid bruits Abdominal: nondistended, nontender, good bowel sounds, no HSM, no bruits Extremities: no cyanosis, clubbing or edema, no sign of DVT, no varicosities  Skin: normal color, no rashes Neuro: alert and oriented x 3, non-focal Pysch: normal affect  EKG Chronic atrial fibrillation, well-controlled rate, no change. BMET    Component Value Date/Time   NA 135 02/09/2012 0710   K 3.3* 02/09/2012 0710   CL 105 02/09/2012 0710   CO2 22 02/09/2012 0710   GLUCOSE 87 02/09/2012 0710   BUN 9 02/09/2012 0710   CREATININE 0.67 02/09/2012 0710   CALCIUM 8.4 02/09/2012 0710   GFRNONAA 86* 02/09/2012 0710   GFRAA >90 02/09/2012 0710    Lipid Panel     Component Value Date/Time   CHOL 116 04/01/2012 0915   TRIG 61.0 04/01/2012 0915   HDL 45.70 04/01/2012 0915   CHOLHDL 3 04/01/2012 0915   VLDL 12.2 04/01/2012 0915   LDLCALC 58 04/01/2012 0915    CBC    Component Value Date/Time   WBC 11.2* 02/09/2012 0710   RBC 3.78* 02/09/2012 0710   HGB 11.9* 02/09/2012  0710   HCT 34.8* 02/09/2012 0710   PLT 143* 02/09/2012 0710   MCV 92.1 02/09/2012 0710   MCH 31.5 02/09/2012 0710   MCHC 34.2 02/09/2012 0710   RDW 16.7* 02/09/2012 0710  LYMPHSABS 0.7 02/07/2012 0050   MONOABS 1.4* 02/07/2012 0050   EOSABS 0.0 02/07/2012 0050   BASOSABS 0.1 02/07/2012 0050

## 2012-12-22 NOTE — Assessment & Plan Note (Signed)
No clinical recurrence. Continue secondary preventative therapy. Carotid ultrasounds this summer at vascular surgery.

## 2012-12-22 NOTE — Assessment & Plan Note (Signed)
Stable. Remains very active. No change in medical therapy. We'll order blood work including compress metabolic profile, digoxin level, lipid panel, and CBC. Return the office in a year.

## 2012-12-22 NOTE — Assessment & Plan Note (Signed)
Stable with good rate control and anticoagulation. Check digoxin level and CBC.

## 2012-12-29 ENCOUNTER — Other Ambulatory Visit (INDEPENDENT_AMBULATORY_CARE_PROVIDER_SITE_OTHER): Payer: Medicare Other

## 2012-12-29 DIAGNOSIS — I4891 Unspecified atrial fibrillation: Secondary | ICD-10-CM

## 2012-12-29 DIAGNOSIS — I2510 Atherosclerotic heart disease of native coronary artery without angina pectoris: Secondary | ICD-10-CM

## 2012-12-29 LAB — CBC WITH DIFFERENTIAL/PLATELET
Basophils Relative: 0.3 % (ref 0.0–3.0)
Eosinophils Relative: 2.1 % (ref 0.0–5.0)
HCT: 36.8 % — ABNORMAL LOW (ref 39.0–52.0)
Lymphs Abs: 1.5 10*3/uL (ref 0.7–4.0)
MCHC: 33.2 g/dL (ref 30.0–36.0)
MCV: 94.3 fl (ref 78.0–100.0)
Monocytes Absolute: 0.3 10*3/uL (ref 0.1–1.0)
Neutro Abs: 6.3 10*3/uL (ref 1.4–7.7)
RBC: 3.91 Mil/uL — ABNORMAL LOW (ref 4.22–5.81)
WBC: 8.4 10*3/uL (ref 4.5–10.5)

## 2012-12-29 LAB — BASIC METABOLIC PANEL
BUN: 8 mg/dL (ref 6–23)
Calcium: 8.9 mg/dL (ref 8.4–10.5)
GFR: 88.54 mL/min (ref >60.00)
Glucose, Bld: 95 mg/dL (ref 70–99)
Potassium: 4.1 mEq/L (ref 3.5–5.1)
Sodium: 139 mEq/L (ref 135–145)

## 2012-12-29 LAB — HEPATIC FUNCTION PANEL
ALT: 22 U/L (ref 0–53)
AST: 29 U/L (ref 0–37)
Bilirubin, Direct: 0.2 mg/dL (ref 0.0–0.3)
Total Bilirubin: 0.9 mg/dL (ref 0.3–1.2)
Total Protein: 6.1 g/dL (ref 6.0–8.3)

## 2012-12-29 LAB — LIPID PANEL
Cholesterol: 132 mg/dL (ref 0–200)
HDL: 42.4 mg/dL (ref >39.00)
VLDL: 17.6 mg/dL (ref 0.0–40.0)

## 2012-12-31 ENCOUNTER — Encounter: Payer: Self-pay | Admitting: *Deleted

## 2013-01-04 ENCOUNTER — Other Ambulatory Visit: Payer: Self-pay | Admitting: Cardiology

## 2013-01-17 ENCOUNTER — Other Ambulatory Visit: Payer: Self-pay | Admitting: Cardiology

## 2013-01-19 ENCOUNTER — Ambulatory Visit (INDEPENDENT_AMBULATORY_CARE_PROVIDER_SITE_OTHER): Payer: Medicare Other | Admitting: Family

## 2013-01-19 DIAGNOSIS — Z7901 Long term (current) use of anticoagulants: Secondary | ICD-10-CM | POA: Diagnosis not present

## 2013-01-19 DIAGNOSIS — Z8679 Personal history of other diseases of the circulatory system: Secondary | ICD-10-CM | POA: Diagnosis not present

## 2013-03-01 DIAGNOSIS — D529 Folate deficiency anemia, unspecified: Secondary | ICD-10-CM | POA: Diagnosis not present

## 2013-03-01 DIAGNOSIS — M069 Rheumatoid arthritis, unspecified: Secondary | ICD-10-CM | POA: Diagnosis not present

## 2013-03-01 DIAGNOSIS — Z79899 Other long term (current) drug therapy: Secondary | ICD-10-CM | POA: Diagnosis not present

## 2013-03-02 ENCOUNTER — Ambulatory Visit (INDEPENDENT_AMBULATORY_CARE_PROVIDER_SITE_OTHER): Payer: Medicare Other | Admitting: Family

## 2013-03-02 DIAGNOSIS — Z8679 Personal history of other diseases of the circulatory system: Secondary | ICD-10-CM | POA: Diagnosis not present

## 2013-03-02 DIAGNOSIS — Z7901 Long term (current) use of anticoagulants: Secondary | ICD-10-CM | POA: Diagnosis not present

## 2013-03-02 DIAGNOSIS — I4891 Unspecified atrial fibrillation: Secondary | ICD-10-CM

## 2013-03-02 NOTE — Patient Instructions (Addendum)
Continue on same dosage 1 pill everyday. Recheck in 6 weeks.   Anticoagulation Dose Instructions as of 03/02/2013     [name] [name] Tue Wed Thu Fri Sat   New Dose 5 mg 5 mg 5 mg 5 mg 5 mg 5 mg 5 mg    Description       Continue on same dosage 1 pill everyday. Recheck in 6 weeks.

## 2013-04-11 ENCOUNTER — Ambulatory Visit (INDEPENDENT_AMBULATORY_CARE_PROVIDER_SITE_OTHER): Payer: Medicare Other | Admitting: Family

## 2013-04-11 DIAGNOSIS — Z7901 Long term (current) use of anticoagulants: Secondary | ICD-10-CM | POA: Diagnosis not present

## 2013-04-11 DIAGNOSIS — Z8679 Personal history of other diseases of the circulatory system: Secondary | ICD-10-CM | POA: Diagnosis not present

## 2013-04-11 DIAGNOSIS — I4891 Unspecified atrial fibrillation: Secondary | ICD-10-CM | POA: Diagnosis not present

## 2013-04-11 NOTE — Patient Instructions (Addendum)
Continue on same dosage  Anticoagulation Dose Instructions as of 04/11/2013     [name] [name] Tue Wed Thu Fri Sat   New Dose 5 mg 5 mg 5 mg 5 mg 5 mg 5 mg 5 mg    Description       Continue on same dosage 1 pill everyday. Recheck in 6 weeks.       1 pill everyday. Recheck in 6 weeks.

## 2013-04-25 ENCOUNTER — Other Ambulatory Visit: Payer: Self-pay | Admitting: Family Medicine

## 2013-05-12 ENCOUNTER — Other Ambulatory Visit: Payer: Self-pay | Admitting: Cardiology

## 2013-05-19 ENCOUNTER — Other Ambulatory Visit: Payer: Medicare Other

## 2013-05-19 ENCOUNTER — Ambulatory Visit: Payer: Medicare Other | Admitting: Neurosurgery

## 2013-05-23 ENCOUNTER — Ambulatory Visit (INDEPENDENT_AMBULATORY_CARE_PROVIDER_SITE_OTHER): Payer: Medicare Other | Admitting: Family

## 2013-05-23 DIAGNOSIS — Z8679 Personal history of other diseases of the circulatory system: Secondary | ICD-10-CM | POA: Diagnosis not present

## 2013-05-23 DIAGNOSIS — I4891 Unspecified atrial fibrillation: Secondary | ICD-10-CM

## 2013-05-23 DIAGNOSIS — Z7901 Long term (current) use of anticoagulants: Secondary | ICD-10-CM | POA: Diagnosis not present

## 2013-05-23 LAB — POCT INR: INR: 2.1

## 2013-05-23 NOTE — Patient Instructions (Addendum)
Continue on same dosage 1 pill everyday. Recheck in 6 weeks Anticoagulation Dose Instructions as of 05/23/2013     [name] [name] Tue Wed Thu Fri Sat   New Dose 5 mg 5 mg 5 mg 5 mg 5 mg 5 mg 5 mg    Description       Continue on same dosage 1 pill everyday. Recheck in 6 weeks.

## 2013-05-26 ENCOUNTER — Other Ambulatory Visit: Payer: Self-pay | Admitting: *Deleted

## 2013-05-26 ENCOUNTER — Other Ambulatory Visit (INDEPENDENT_AMBULATORY_CARE_PROVIDER_SITE_OTHER): Payer: Medicare Other | Admitting: Vascular Surgery

## 2013-05-26 DIAGNOSIS — I6529 Occlusion and stenosis of unspecified carotid artery: Secondary | ICD-10-CM

## 2013-06-02 ENCOUNTER — Encounter: Payer: Self-pay | Admitting: Vascular Surgery

## 2013-06-06 DIAGNOSIS — Z79899 Other long term (current) drug therapy: Secondary | ICD-10-CM | POA: Diagnosis not present

## 2013-06-06 DIAGNOSIS — M069 Rheumatoid arthritis, unspecified: Secondary | ICD-10-CM | POA: Diagnosis not present

## 2013-06-29 DIAGNOSIS — Z79899 Other long term (current) drug therapy: Secondary | ICD-10-CM | POA: Diagnosis not present

## 2013-06-29 DIAGNOSIS — I9589 Other hypotension: Secondary | ICD-10-CM | POA: Diagnosis not present

## 2013-06-29 DIAGNOSIS — M069 Rheumatoid arthritis, unspecified: Secondary | ICD-10-CM | POA: Diagnosis not present

## 2013-06-29 DIAGNOSIS — D529 Folate deficiency anemia, unspecified: Secondary | ICD-10-CM | POA: Diagnosis not present

## 2013-07-01 ENCOUNTER — Telehealth: Payer: Self-pay | Admitting: Family Medicine

## 2013-07-01 NOTE — Telephone Encounter (Signed)
Pt had visit w/ Dr Kellie Simmering and his bp was 80/50. MD asked pt to make Dr Tawanna Cooler aware and Dr Daleen Squibb. (cardiologist) Would like to know if he should come in for  Assessment of meds.  Also Dr Kellie Simmering was to send info regarding this to Both MD's. Pt would like to know if you received. Pls advise.

## 2013-07-01 NOTE — Telephone Encounter (Signed)
Spoke with patient and an appointment made with Dr Caryl Never on Monday.  Patient will keep record of his blood pressure readings and bring them to the visit with him.

## 2013-07-04 ENCOUNTER — Encounter: Payer: Self-pay | Admitting: Family Medicine

## 2013-07-04 ENCOUNTER — Ambulatory Visit (INDEPENDENT_AMBULATORY_CARE_PROVIDER_SITE_OTHER): Payer: Medicare Other | Admitting: General Practice

## 2013-07-04 ENCOUNTER — Ambulatory Visit (INDEPENDENT_AMBULATORY_CARE_PROVIDER_SITE_OTHER): Payer: Medicare Other | Admitting: Family Medicine

## 2013-07-04 VITALS — BP 114/60 | HR 80 | Temp 98.0°F | Wt 175.0 lb

## 2013-07-04 DIAGNOSIS — I4891 Unspecified atrial fibrillation: Secondary | ICD-10-CM

## 2013-07-04 DIAGNOSIS — I959 Hypotension, unspecified: Secondary | ICD-10-CM

## 2013-07-04 DIAGNOSIS — Z7901 Long term (current) use of anticoagulants: Secondary | ICD-10-CM

## 2013-07-04 DIAGNOSIS — E039 Hypothyroidism, unspecified: Secondary | ICD-10-CM | POA: Insufficient documentation

## 2013-07-04 DIAGNOSIS — Z8679 Personal history of other diseases of the circulatory system: Secondary | ICD-10-CM

## 2013-07-04 LAB — CBC WITH DIFFERENTIAL/PLATELET
Basophils Relative: 0.2 % (ref 0.0–3.0)
Eosinophils Absolute: 0.1 10*3/uL (ref 0.0–0.7)
Eosinophils Relative: 1 % (ref 0.0–5.0)
Lymphocytes Relative: 13.3 % (ref 12.0–46.0)
Neutrophils Relative %: 75.4 % (ref 43.0–77.0)
RBC: 3.67 Mil/uL — ABNORMAL LOW (ref 4.22–5.81)
WBC: 8.6 10*3/uL (ref 4.5–10.5)

## 2013-07-04 LAB — BASIC METABOLIC PANEL
Calcium: 8.9 mg/dL (ref 8.4–10.5)
Creatinine, Ser: 0.8 mg/dL (ref 0.4–1.5)

## 2013-07-04 LAB — POCT INR: INR: 1.7

## 2013-07-04 NOTE — Progress Notes (Signed)
  Subjective:    Patient ID: [name] [name], male    DOB: 16-Aug-1925, 79 y.o.   MRN: [id number]  HPI Patient has chronic problems of hypothyroidism, history of TIA, CAD, hypertension, hyperlipidemia, atrophic fibrillation, and chronic mild normocytic anemia, and reported rheumatoid arthritis He was seen recently at rheumatology and had blood pressure recorded 80/50 though he denied any dizziness whatsoever No recent syncope. Good fluid intake. He does take low-dose furosemide 20 mg daily and also metoprolol. He denies ever having significant hypotension. Low-grade anemia with hemoglobin usually 11 range.  He has hypothyroidism and most recent TSH was over one year ago and TSH was elevated at that time. He cannot confirm today whether he is taking thyroid medication  Remains on Coumadin for atrial fibrillation  Past Medical History  Diagnosis Date  . Carotid artery occlusion     right total internal artery occlusion  . Coronary artery disease     s/p cardiac cath in 1990s, and cardiolite study  in 2005 showing EF 54%  . Hyperlipidemia   . Hypertension   . TIA (transient ischemic attack)   . Degenerative disc disease     cervical spine  . Arrhythmia   . Atrial fibrillation 03/2011  . Myocardial infarction 1992 and 1994  . Angina     "jaw & elbow was the worse"  . Pneumonia ~ 1935  . GERD (gastroesophageal reflux disease)   . Skin cancer     forehead  . Urgency incontinence   . Bilateral cellulitis of lower leg    Past Surgical History  Procedure Laterality Date  . Cataract extraction w/ intraocular lens  implant, bilateral    . Cardiac catheterization  1990s  . Skin cancer excision  12/2011    forehead    reports that he has quit smoking. His smoking use included Cigarettes. He has a 40 pack-year smoking history. He quit smokeless tobacco use about 22 years ago. He reports that he does not drink alcohol or use illicit drugs. family history includes Heart disease in his father  and mother and Malignant hyperthermia in his father and mother. Allergies  Allergen Reactions  . Levofloxacin Rash    "thought they were sticking swords thru me"      Review of Systems  Constitutional: Negative for appetite change, fatigue and unexpected weight change.  Eyes: Negative for visual disturbance.  Respiratory: Negative for cough, chest tightness and shortness of breath.   Cardiovascular: Negative for chest pain and palpitations.  Neurological: Negative for dizziness, syncope, weakness, light-headedness and headaches.       Objective:   Physical Exam  Constitutional: He is oriented to person, place, and time. He appears well-developed and well-nourished.  HENT:  Mouth/Throat: Oropharynx is clear and moist.  Neck: Neck supple.  Cardiovascular: Normal rate.   Irregular rhythm rate controlled  Pulmonary/Chest: Effort normal and breath sounds normal. No respiratory distress. He has no wheezes. He has no rales.  Musculoskeletal:  Trace edema lower legs bilaterally which apparently is chronic  Neurological: He is alert and oriented to person, place, and time.          Assessment & Plan:  #1 reported singular episode of low blood pressure though no orthostasis today. Continue current medications. Emphasized adequate hydration. Check labs with basic metabolic panel, CBC, TSH #2 hypothyroidism. Patient cannot confirm if he is on medication. Recheck TSH as above. #3 atrial fibrillation -chronic Coumadin. Rate controlled. Check INR today

## 2013-07-05 ENCOUNTER — Other Ambulatory Visit: Payer: Self-pay

## 2013-07-05 MED ORDER — POTASSIUM CHLORIDE CRYS ER 20 MEQ PO TBCR: 20.0000 meq | EXTENDED_RELEASE_TABLET | Freq: Every day | ORAL | Status: DC

## 2013-07-06 ENCOUNTER — Telehealth: Payer: Self-pay | Admitting: Family Medicine

## 2013-07-06 NOTE — Telephone Encounter (Signed)
Pt states he is to inform Dr Caryl Never that he IS taking levothyroxine and he has one more refill of 90 left.  Pt picked up new RX last nite. Took first dose this AM.

## 2013-07-11 ENCOUNTER — Telehealth: Payer: Self-pay | Admitting: Family Medicine

## 2013-07-11 NOTE — Telephone Encounter (Signed)
Increase K [employer]-give him list and repeat BMP in one week.  Let's leave off Kdur for now.

## 2013-07-11 NOTE — Telephone Encounter (Signed)
Pt seen on 8/11 and was rx'd potassium chloride SA (K-DUR,KLOR-CON) 20 MEQ tablet Pt took 3 doses (1/day) and had severe diarrhea for 3 days. It started right away. Pt stopped the med, tried OTC meds for diarrhea, but nothing helped until he stopped taking the med.  Pt would like to know if he needs something else? Pls advise. [facility] (Pt spent 30+minutes on phone holding for triage and finally came into office)

## 2013-07-12 NOTE — Telephone Encounter (Signed)
Pt aware of per dr. Caryl Never and im mailing a information on [employer]

## 2013-07-19 ENCOUNTER — Other Ambulatory Visit (INDEPENDENT_AMBULATORY_CARE_PROVIDER_SITE_OTHER): Payer: Medicare Other

## 2013-07-19 ENCOUNTER — Telehealth: Payer: Self-pay

## 2013-07-19 DIAGNOSIS — I10 Essential (primary) hypertension: Secondary | ICD-10-CM | POA: Diagnosis not present

## 2013-07-19 LAB — BASIC METABOLIC PANEL
CO2: 30 mEq/L (ref 19–32)
Calcium: 8.9 mg/dL (ref 8.4–10.5)
Sodium: 137 mEq/L (ref 135–145)

## 2013-07-19 MED ORDER — DICYCLOMINE HCL 10 MG PO CAPS: ORAL_CAPSULE | ORAL | Status: DC

## 2013-07-19 NOTE — Telephone Encounter (Signed)
Hold any further K until we get his labs back.

## 2013-07-19 NOTE — Telephone Encounter (Signed)
Pt informed

## 2013-07-19 NOTE — Telephone Encounter (Signed)
Pt was confused on the medication that he told me. His labs come back and he had a low Potassium, it is K-dur 20 meq. He took the medication for 3 days and he had diarrhea for those 3 days. Pt stated that he has stopped taking the medication he wants to know if he can take a half of tab. Or is there another medication that he can take.

## 2013-07-26 ENCOUNTER — Telehealth: Payer: Self-pay | Admitting: Family Medicine

## 2013-07-26 MED ORDER — WARFARIN SODIUM 5 MG PO TABS: ORAL_TABLET | ORAL | Status: DC

## 2013-07-26 NOTE — Telephone Encounter (Signed)
Pt needs refill on warfarin 5 mg #90 with [phone]. [facility] warfarin 5 mg #30.

## 2013-07-26 NOTE — Telephone Encounter (Signed)
Done

## 2013-08-09 ENCOUNTER — Ambulatory Visit (INDEPENDENT_AMBULATORY_CARE_PROVIDER_SITE_OTHER): Payer: Medicare Other

## 2013-08-09 DIAGNOSIS — Z23 Encounter for immunization: Secondary | ICD-10-CM | POA: Diagnosis not present

## 2013-08-15 ENCOUNTER — Ambulatory Visit (INDEPENDENT_AMBULATORY_CARE_PROVIDER_SITE_OTHER): Payer: Medicare Other | Admitting: General Practice

## 2013-08-15 ENCOUNTER — Other Ambulatory Visit (INDEPENDENT_AMBULATORY_CARE_PROVIDER_SITE_OTHER): Payer: Medicare Other

## 2013-08-15 DIAGNOSIS — Z7901 Long term (current) use of anticoagulants: Secondary | ICD-10-CM | POA: Diagnosis not present

## 2013-08-15 DIAGNOSIS — Z8679 Personal history of other diseases of the circulatory system: Secondary | ICD-10-CM | POA: Diagnosis not present

## 2013-08-15 DIAGNOSIS — I4891 Unspecified atrial fibrillation: Secondary | ICD-10-CM | POA: Diagnosis not present

## 2013-08-15 DIAGNOSIS — I10 Essential (primary) hypertension: Secondary | ICD-10-CM

## 2013-08-15 LAB — BASIC METABOLIC PANEL
BUN: 9 mg/dL (ref 6–23)
Calcium: 9 mg/dL (ref 8.4–10.5)
GFR: 82.89 mL/min (ref >60.00)
Glucose, Bld: 86 mg/dL (ref 70–99)
Sodium: 136 mEq/L (ref 135–145)

## 2013-09-05 ENCOUNTER — Telehealth: Payer: Self-pay | Admitting: Family Medicine

## 2013-09-05 NOTE — Telephone Encounter (Signed)
Pt would like a refill of dicyclomine (BENTYL) 10 MG capsule Seen dr Caryl Never 8/26. Pt states he would like this rx to keep on hand when this happens. [facility]

## 2013-09-06 MED ORDER — DICYCLOMINE HCL 10 MG PO CAPS: ORAL_CAPSULE | ORAL | Status: DC

## 2013-09-08 ENCOUNTER — Emergency Department (HOSPITAL_COMMUNITY)
Admission: EM | Admit: 2013-09-08 | Discharge: 2013-09-08 | Disposition: A | Discharge status: Home / Self Care | Payer: Medicare Other | Attending: Emergency Medicine | Admitting: Emergency Medicine

## 2013-09-08 ENCOUNTER — Encounter (HOSPITAL_COMMUNITY): Payer: Self-pay | Admitting: Emergency Medicine

## 2013-09-08 ENCOUNTER — Emergency Department (HOSPITAL_COMMUNITY): Payer: Medicare Other

## 2013-09-08 DIAGNOSIS — I4891 Unspecified atrial fibrillation: Secondary | ICD-10-CM | POA: Insufficient documentation

## 2013-09-08 DIAGNOSIS — Y939 Activity, unspecified: Secondary | ICD-10-CM | POA: Insufficient documentation

## 2013-09-08 DIAGNOSIS — Z85828 Personal history of other malignant neoplasm of skin: Secondary | ICD-10-CM | POA: Insufficient documentation

## 2013-09-08 DIAGNOSIS — I10 Essential (primary) hypertension: Secondary | ICD-10-CM | POA: Diagnosis not present

## 2013-09-08 DIAGNOSIS — I2510 Atherosclerotic heart disease of native coronary artery without angina pectoris: Secondary | ICD-10-CM | POA: Insufficient documentation

## 2013-09-08 DIAGNOSIS — S7010XA Contusion of unspecified thigh, initial encounter: Secondary | ICD-10-CM | POA: Insufficient documentation

## 2013-09-08 DIAGNOSIS — Z8739 Personal history of other diseases of the musculoskeletal system and connective tissue: Secondary | ICD-10-CM | POA: Insufficient documentation

## 2013-09-08 DIAGNOSIS — Z87891 Personal history of nicotine dependence: Secondary | ICD-10-CM | POA: Insufficient documentation

## 2013-09-08 DIAGNOSIS — Z872 Personal history of diseases of the skin and subcutaneous tissue: Secondary | ICD-10-CM | POA: Insufficient documentation

## 2013-09-08 DIAGNOSIS — Z8719 Personal history of other diseases of the digestive system: Secondary | ICD-10-CM | POA: Diagnosis not present

## 2013-09-08 DIAGNOSIS — S0083XA Contusion of other part of head, initial encounter: Secondary | ICD-10-CM | POA: Diagnosis not present

## 2013-09-08 DIAGNOSIS — Z8701 Personal history of pneumonia (recurrent): Secondary | ICD-10-CM | POA: Diagnosis not present

## 2013-09-08 DIAGNOSIS — I252 Old myocardial infarction: Secondary | ICD-10-CM | POA: Insufficient documentation

## 2013-09-08 DIAGNOSIS — S7001XA Contusion of right hip, initial encounter: Secondary | ICD-10-CM

## 2013-09-08 DIAGNOSIS — Z8673 Personal history of transient ischemic attack (TIA), and cerebral infarction without residual deficits: Secondary | ICD-10-CM | POA: Insufficient documentation

## 2013-09-08 DIAGNOSIS — S0003XA Contusion of scalp, initial encounter: Secondary | ICD-10-CM | POA: Insufficient documentation

## 2013-09-08 DIAGNOSIS — W1809XA Striking against other object with subsequent fall, initial encounter: Secondary | ICD-10-CM | POA: Insufficient documentation

## 2013-09-08 DIAGNOSIS — IMO0002 Reserved for concepts with insufficient information to code with codable children: Secondary | ICD-10-CM | POA: Diagnosis not present

## 2013-09-08 DIAGNOSIS — Z79899 Other long term (current) drug therapy: Secondary | ICD-10-CM | POA: Diagnosis not present

## 2013-09-08 DIAGNOSIS — S0093XA Contusion of unspecified part of head, initial encounter: Secondary | ICD-10-CM

## 2013-09-08 DIAGNOSIS — E785 Hyperlipidemia, unspecified: Secondary | ICD-10-CM | POA: Insufficient documentation

## 2013-09-08 DIAGNOSIS — Y929 Unspecified place or not applicable: Secondary | ICD-10-CM | POA: Insufficient documentation

## 2013-09-08 DIAGNOSIS — S7000XA Contusion of unspecified hip, initial encounter: Secondary | ICD-10-CM | POA: Diagnosis not present

## 2013-09-08 LAB — COMPREHENSIVE METABOLIC PANEL
BUN: 6 mg/dL (ref 6–23)
CO2: 29 mEq/L (ref 19–32)
Calcium: 8.4 mg/dL (ref 8.4–10.5)
Chloride: 100 mEq/L (ref 96–112)
Creatinine, Ser: 0.75 mg/dL (ref 0.50–1.35)
GFR calc Af Amer: >90 mL/min (ref >90)
GFR calc non Af Amer: 81 mL/min — ABNORMAL LOW (ref >90)
Glucose, Bld: 116 mg/dL — ABNORMAL HIGH (ref 70–99)
Total Bilirubin: 0.4 mg/dL (ref 0.3–1.2)

## 2013-09-08 LAB — CBC WITH DIFFERENTIAL/PLATELET
Basophils Absolute: 0 10*3/uL (ref 0.0–0.1)
Basophils Relative: 0 % (ref 0–1)
Eosinophils Absolute: 0.3 10*3/uL (ref 0.0–0.7)
Eosinophils Relative: 3 % (ref 0–5)
HCT: 30.5 % — ABNORMAL LOW (ref 39.0–52.0)
Hemoglobin: 10.2 g/dL — ABNORMAL LOW (ref 13.0–17.0)
Lymphocytes Relative: 12 % (ref 12–46)
Lymphs Abs: 1.1 10*3/uL (ref 0.7–4.0)
MCH: 30.9 pg (ref 26.0–34.0)
MCHC: 33.4 g/dL (ref 30.0–36.0)
MCV: 92.4 fL (ref 78.0–100.0)
Monocytes Absolute: 1 10*3/uL (ref 0.1–1.0)
Monocytes Relative: 11 % (ref 3–12)
Neutro Abs: 6.7 10*3/uL (ref 1.7–7.7)
Neutrophils Relative %: 75 % (ref 43–77)
Platelets: 157 10*3/uL (ref 150–400)
RBC: 3.3 MIL/uL — ABNORMAL LOW (ref 4.22–5.81)
RDW: 16.8 % — ABNORMAL HIGH (ref 11.5–15.5)
WBC: 9 10*3/uL (ref 4.0–10.5)

## 2013-09-08 LAB — PROTIME-INR: INR: 2.08 — ABNORMAL HIGH (ref 0.00–1.49)

## 2013-09-08 NOTE — ED Notes (Signed)
Pt. lost balance and fell on his buttocks this evening , no LOC , pt. stated he is taking Coumadin for atrial fibrillation . Pt. stated pain at buttocks.

## 2013-09-08 NOTE — ED Provider Notes (Signed)
CSN: [id number]     Arrival date & time 09/08/13  0037 History   First MD Initiated Contact with Patient 09/08/13 0146     Chief Complaint  Patient presents with  . Fall   (Consider location/radiation/quality/duration/timing/severity/associated sxs/prior Treatment) HPI  Patient brought to the ED by his grandson at the request of his grand daughter to be "checked out" after a fall this evening. His wife accidentally lost her balance and knocked into him causing him to fall onto his right hip and then secondarily hitting his head. He was able to get up and has been ambulating without difficulty just "a little bit slow". He doesn't have pain unless he sits on that side. He denies loc, neck pain, confusion. He is smiling during talking and admits he feels pretty good. His grandson who is with him says he is acting at baseline and that "he thinks he is fine". Pt takes coumadin for a. fib  Past Medical History  Diagnosis Date  . Carotid artery occlusion     right total internal artery occlusion  . Coronary artery disease     s/p cardiac cath in 1990s, and cardiolite study  in 2005 showing EF 54%  . Hyperlipidemia   . Hypertension   . TIA (transient ischemic attack)   . Degenerative disc disease     cervical spine  . Arrhythmia   . Atrial fibrillation 03/2011  . Myocardial infarction 1992 and 1994  . Angina     "jaw & elbow was the worse"  . Pneumonia ~ 1935  . GERD (gastroesophageal reflux disease)   . Skin cancer     forehead  . Urgency incontinence   . Bilateral cellulitis of lower leg    Past Surgical History  Procedure Laterality Date  . Cataract extraction w/ intraocular lens  implant, bilateral    . Cardiac catheterization  1990s  . Skin cancer excision  12/2011    forehead   Family History  Problem Relation Age of Onset  . Heart disease Mother   . Malignant hyperthermia Mother   . Heart disease Father   . Malignant hyperthermia Father    History  Substance Use Topics   . Smoking status: Former Smoker -- 1.00 packs/day for 40 years    Types: Cigarettes  . Smokeless tobacco: Former Neurosurgeon    Quit date: 11/26/1990     Comment: quit around 1992  . Alcohol Use: No     Comment: former, quit drinking in 1992    Review of Systems  Review of Systems  Gen: no weight loss, fevers, chills, night sweats  Eyes: no discharge or drainage, no occular pain or visual changes  Nose: no epistaxis or rhinorrhea  Mouth: no dental pain, no sore throat  Neck: no neck pain  Lungs:No wheezing, coughing or hemoptysis CV: no chest pain, palpitations, dependent edema or orthopnea  Abd: no abdominal pain, nausea, vomiting  GU: no dysuria or gross hematuria  MSK: +  Right hip contusion Neuro: no headache, no focal neurologic deficits  + head contusion Skin: no abnormalities Psyche: negative.   Allergies  Levofloxacin  Home Medications   Current Outpatient Rx  Name  Route  Sig  Dispense  Refill  . aspirin 81 MG tablet   Oral   Take 81 mg by mouth at bedtime.          Marland Kitchen atorvastatin (LIPITOR) 10 MG tablet   Oral   Take 1 tablet (10 mg total) by mouth at bedtime.  90 tablet   3   . Calcium-Vitamin D (CALTRATE 600 PLUS-VIT D PO)   Oral   Take 1 tablet by mouth at bedtime.          . digoxin (LANOXIN) 0.125 MG tablet   Oral   Take 0.125 mg by mouth daily.         . furosemide (LASIX) 20 MG tablet   Oral   Take 20 mg by mouth daily.         Marland Kitchen levothyroxine (SYNTHROID, LEVOTHROID) 100 MCG tablet   Oral   Take 1 tablet (100 mcg total) by mouth daily.   90 tablet   3   . methotrexate (RHEUMATREX) 2.5 MG tablet   Oral   Take 5-7.5 mg by mouth 2 (two) times a week. Take 3 tablets on Saturday and 2 tablets on sunday         . metoprolol succinate (TOPROL-XL) 50 MG 24 hr tablet   Oral   Take 50 mg by mouth 2 (two) times daily. Take with or immediately following a meal.         . Multiple Vitamin (MULTIVITAMIN WITH MINERALS) TABS tablet    Oral   Take 1 tablet by mouth daily.         . nitroGLYCERIN (NITROSTAT) 0.4 MG SL tablet   Sublingual   Place 0.4 mg under the tongue every 5 (five) minutes as needed. For chest pain         . potassium chloride SA (K-DUR,KLOR-CON) 20 MEQ tablet   Oral   Take 1 tablet (20 mEq total) by mouth daily.   30 tablet   5   . traMADol (ULTRAM) 50 MG tablet   Oral   Take 50 mg by mouth every 8 (eight) hours as needed. For pain         . warfarin (COUMADIN) 5 MG tablet   Oral   Take 5 mg by mouth every evening.          BP 139/70  Pulse 70  Temp(Src) 98.5 F (36.9 C) (Oral)  Resp 14  SpO2 97% Physical Exam  Nursing note and vitals reviewed. Constitutional: He appears well-developed and well-nourished. No distress.  HENT:  Head: Normocephalic and atraumatic.    Eyes: Pupils are equal, round, and reactive to light.  Neck: Normal range of motion. Neck supple. No spinous process tenderness and no muscular tenderness present. Normal range of motion present.  Cardiovascular: Normal rate and regular rhythm.   Pulmonary/Chest: Effort normal.  Abdominal: Soft.  Musculoskeletal:       Right hip: He exhibits tenderness. He exhibits normal range of motion, normal strength, no bony tenderness, no swelling, no crepitus, no deformity and no laceration.       Legs: Neurological: He is alert.  Skin: Skin is warm and dry.    ED Course  Procedures (including critical care time) Labs Review Labs Reviewed  PROTIME-INR - Abnormal; Notable for the following:    Prothrombin Time 22.7 (*)    INR 2.08 (*)    All other components within normal limits  CBC WITH DIFFERENTIAL - Abnormal; Notable for the following:    RBC 3.30 (*)    Hemoglobin 10.2 (*)    HCT 30.5 (*)    RDW 16.8 (*)    All other components within normal limits  COMPREHENSIVE METABOLIC PANEL - Abnormal; Notable for the following:    Potassium 3.3 (*)    Glucose, Bld 116 (*)  Total Protein 5.9 (*)    Albumin 3.2  (*)    GFR calc non Af Amer 81 (*)    All other components within normal limits   Imaging Review Dg Hip Complete Right  09/08/2013   CLINICAL DATA:  Fall onto right side  EXAM: RIGHT HIP - COMPLETE 2+ VIEW  COMPARISON:  None.  FINDINGS: There is no evidence of hip fracture or dislocation. Joint space narrowing with subchondral sclerosis and juxta-articular osteophytosis is seen at the right hip, compatible with moderate to severe osteoarthrosis. The bony pelvis is intact.  Vascular calcifications are noted within the upper thighs. Visualized bowel gas pattern is unremarkable.  IMPRESSION: No acute fracture dislocation about the right hip.  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   Electronically Signed   By: Rise Mu M.D.   On: 09/08/2013 02:45   Ct Head Wo Contrast  09/08/2013   CLINICAL DATA:  Fall, on blood thinners  EXAM: CT HEAD WITHOUT CONTRAST  TECHNIQUE: Contiguous axial images were obtained from the base of the skull through the vertex without intravenous contrast.  COMPARISON:  Prior CT from 02/07/2012  FINDINGS: Diffuse prominence of the CSF containing spaces is consistent with age related generalized cerebral atrophy. Encephalomalacia within the right frontal lobe and right caudate head are again noted, compatible with remote infarcts. Moderate microvascular ischemic disease is similar to prior exam. There is a small contusion at the posterior mid scalp near the vertex. The underlying calvarium is intact. There is no extra-axial fluid collection. No acute intracranial hemorrhage or infarct.  Orbits are within normal limits. Paranasal sinuses and mastoid air cells are clear.  IMPRESSION: Small posterior scalp contusion without acute fracture or intracranial process.  Similar appearance of prominent atrophy and chronic microvascular ischemic disease.  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   Electronically Signed   By: Rise Mu  M.D.   On: 09/08/2013 02:51    EKG Interpretation   None       MDM   1. Contusion, hip and thigh, right, initial encounter   2. Head contusion, initial encounter    Images are reassuring. Patient able to ambulate but is a little sore. Mentating at baseline. Discussed with Dr. Elesa Massed who has seen patient as well and agree that he is safe to dc at this time.   79 y.o.Marzetta Board Beckworth's evaluation in the Emergency Department is complete. It has been determined that no acute conditions requiring further emergency intervention are present at this time. The patient/guardian have been advised of the diagnosis and plan. We have discussed signs and symptoms that warrant return to the ED, such as changes or worsening in symptoms.  Vital signs are stable at discharge. Filed Vitals:   09/08/13 0306  BP: 150/61  Pulse: 64  Temp:   Resp: 14    Patient/guardian has voiced understanding and agreed to follow-up with the PCP or specialist.    Dorthula Matas, PA-C 09/08/13 0308

## 2013-09-08 NOTE — ED Provider Notes (Signed)
Medical screening examination/treatment/procedure(s) were conducted as a shared visit with non-physician practitioner(s) and myself.  I personally evaluated the patient during the encounter   Pt is a 79 y.o. M with a history of CAD, hypertension, hyperlipidemia, A. fib on Coumadin who had a mechanical fall today striking his right hip and head at approximately 6 PM. He had no loss of consciousness. No neurologic deficits. No headache or vomiting. He was convinced to come to the emergency periphery vitals she by his daughter. On exam, patient is hemodynamically stable, pleasant, well-appearing, neurologically intact, no midline spinal tenderness, step-off or deformity, chest is stable and nontender to palpation, lung sounds are clear bilaterally, abdomen soft nontender, pelvis is stable and he has full range of motion in all joints of the upper and lower extremities, normal gait. Head CT shows no intracranial hemorrhage or skull fracture. Right hip x-ray is negative and patient is able to ambulate. Discussed at length with family and patient the risk of delayed intracranial hemorrhage given he is on Coumadin however my suspicion for this is very low given that his injury has been greater than 6 hours ago. Family reports they will be able to stay with patient tonight for close evaluation. Given instructions for supportive care for head injury. Patient and family verbalized understanding and are comfortable with plan.  Layla Maw Ward, DO 09/08/13 [phone]

## 2013-09-22 ENCOUNTER — Other Ambulatory Visit: Payer: Self-pay | Admitting: Family Medicine

## 2013-09-26 ENCOUNTER — Ambulatory Visit (INDEPENDENT_AMBULATORY_CARE_PROVIDER_SITE_OTHER): Payer: Medicare Other | Admitting: General Practice

## 2013-09-26 DIAGNOSIS — Z7901 Long term (current) use of anticoagulants: Secondary | ICD-10-CM

## 2013-09-26 DIAGNOSIS — Z8679 Personal history of other diseases of the circulatory system: Secondary | ICD-10-CM

## 2013-09-26 DIAGNOSIS — I4891 Unspecified atrial fibrillation: Secondary | ICD-10-CM | POA: Diagnosis not present

## 2013-09-26 DIAGNOSIS — Z79899 Other long term (current) drug therapy: Secondary | ICD-10-CM | POA: Diagnosis not present

## 2013-09-26 DIAGNOSIS — M069 Rheumatoid arthritis, unspecified: Secondary | ICD-10-CM | POA: Diagnosis not present

## 2013-09-26 LAB — POCT INR: INR: 1.9

## 2013-10-03 ENCOUNTER — Other Ambulatory Visit: Payer: Self-pay | Admitting: Cardiology

## 2013-10-05 DIAGNOSIS — L820 Inflamed seborrheic keratosis: Secondary | ICD-10-CM | POA: Diagnosis not present

## 2013-10-05 DIAGNOSIS — L570 Actinic keratosis: Secondary | ICD-10-CM | POA: Diagnosis not present

## 2013-10-24 ENCOUNTER — Ambulatory Visit (INDEPENDENT_AMBULATORY_CARE_PROVIDER_SITE_OTHER): Payer: Medicare Other | Admitting: General Practice

## 2013-10-24 DIAGNOSIS — I4891 Unspecified atrial fibrillation: Secondary | ICD-10-CM

## 2013-10-24 DIAGNOSIS — Z8679 Personal history of other diseases of the circulatory system: Secondary | ICD-10-CM

## 2013-10-24 DIAGNOSIS — Z7901 Long term (current) use of anticoagulants: Secondary | ICD-10-CM

## 2013-10-24 LAB — POCT INR: INR: 1.6

## 2013-10-24 NOTE — Progress Notes (Signed)
Pre-visit discussion using our clinic review tool. No additional management support is needed unless otherwise documented below in the visit note.  

## 2013-10-26 DIAGNOSIS — M069 Rheumatoid arthritis, unspecified: Secondary | ICD-10-CM | POA: Diagnosis not present

## 2013-10-26 DIAGNOSIS — M545 Low back pain: Secondary | ICD-10-CM | POA: Diagnosis not present

## 2013-10-26 DIAGNOSIS — Z79899 Other long term (current) drug therapy: Secondary | ICD-10-CM | POA: Diagnosis not present

## 2013-10-26 DIAGNOSIS — D529 Folate deficiency anemia, unspecified: Secondary | ICD-10-CM | POA: Diagnosis not present

## 2013-11-07 ENCOUNTER — Other Ambulatory Visit: Payer: Self-pay | Admitting: General Practice

## 2013-11-07 ENCOUNTER — Ambulatory Visit (INDEPENDENT_AMBULATORY_CARE_PROVIDER_SITE_OTHER): Payer: Medicare Other | Admitting: General Practice

## 2013-11-07 DIAGNOSIS — Z8679 Personal history of other diseases of the circulatory system: Secondary | ICD-10-CM | POA: Diagnosis not present

## 2013-11-07 DIAGNOSIS — Z7901 Long term (current) use of anticoagulants: Secondary | ICD-10-CM

## 2013-11-07 DIAGNOSIS — I4891 Unspecified atrial fibrillation: Secondary | ICD-10-CM

## 2013-11-07 LAB — POCT INR: INR: 1.7

## 2013-11-07 MED ORDER — WARFARIN SODIUM 5 MG PO TABS: ORAL_TABLET | ORAL | Status: DC

## 2013-11-07 NOTE — Progress Notes (Signed)
Pre-visit discussion using our clinic review tool. No additional management support is needed unless otherwise documented below in the visit note.  

## 2013-11-10 ENCOUNTER — Other Ambulatory Visit: Payer: Self-pay | Admitting: General Practice

## 2013-11-10 MED ORDER — WARFARIN SODIUM 2.5 MG PO TABS: ORAL_TABLET | ORAL | Status: DC

## 2013-11-22 DIAGNOSIS — L570 Actinic keratosis: Secondary | ICD-10-CM | POA: Diagnosis not present

## 2013-11-22 DIAGNOSIS — Z85828 Personal history of other malignant neoplasm of skin: Secondary | ICD-10-CM | POA: Diagnosis not present

## 2013-11-27 ENCOUNTER — Other Ambulatory Visit: Payer: Self-pay | Admitting: Cardiology

## 2013-12-05 ENCOUNTER — Ambulatory Visit (INDEPENDENT_AMBULATORY_CARE_PROVIDER_SITE_OTHER): Payer: Medicare Other | Admitting: General Practice

## 2013-12-05 DIAGNOSIS — I4891 Unspecified atrial fibrillation: Secondary | ICD-10-CM

## 2013-12-05 DIAGNOSIS — Z8679 Personal history of other diseases of the circulatory system: Secondary | ICD-10-CM

## 2013-12-05 DIAGNOSIS — Z7901 Long term (current) use of anticoagulants: Secondary | ICD-10-CM

## 2013-12-05 DIAGNOSIS — Z5181 Encounter for therapeutic drug level monitoring: Secondary | ICD-10-CM

## 2013-12-05 LAB — POCT INR: INR: 2.6

## 2013-12-05 NOTE — Progress Notes (Signed)
Pre-visit discussion using our clinic review tool. No additional management support is needed unless otherwise documented below in the visit note.  

## 2013-12-22 ENCOUNTER — Ambulatory Visit (INDEPENDENT_AMBULATORY_CARE_PROVIDER_SITE_OTHER): Payer: Medicare Other | Admitting: Cardiology

## 2013-12-22 ENCOUNTER — Encounter: Payer: Self-pay | Admitting: Cardiology

## 2013-12-22 VITALS — BP 108/70 | HR 64 | Ht 72.0 in | Wt 175.1 lb

## 2013-12-22 DIAGNOSIS — I6529 Occlusion and stenosis of unspecified carotid artery: Secondary | ICD-10-CM | POA: Diagnosis not present

## 2013-12-22 DIAGNOSIS — I2510 Atherosclerotic heart disease of native coronary artery without angina pectoris: Secondary | ICD-10-CM

## 2013-12-22 DIAGNOSIS — I4891 Unspecified atrial fibrillation: Secondary | ICD-10-CM | POA: Diagnosis not present

## 2013-12-22 DIAGNOSIS — I10 Essential (primary) hypertension: Secondary | ICD-10-CM | POA: Diagnosis not present

## 2013-12-22 DIAGNOSIS — E785 Hyperlipidemia, unspecified: Secondary | ICD-10-CM

## 2013-12-22 NOTE — Patient Instructions (Signed)
Your physician recommends that you return for lab work in: soon. Please do not eat or drink after midnight the night before labs are drawn.  Your physician wants you to follow-up in: 1 year. You will receive a reminder letter in the mail two months in advance. If you don't receive a letter, please call our office to schedule the follow-up appointment.

## 2013-12-22 NOTE — Addendum Note (Signed)
**Note De-Identified Haig Gerardo Obfuscation** Addended by: Dennie Fetters on: 12/22/2013 02:34 PM   Modules accepted: Orders

## 2013-12-22 NOTE — Progress Notes (Signed)
Patient ID: [name] [name], male   DOB: 04/09/1925, 80 y.o.   MRN: [id number]     Patient Name: [name] [name] Date of Encounter: 12/22/2013  Primary Care Provider:  [name], MD Primary Cardiologist:  Ena Dawley, H (formerly Dr. Verl Blalock)  Problem List   Past Medical History  Diagnosis Date  . Carotid artery occlusion     right total internal artery occlusion  . Coronary artery disease     s/p cardiac cath in 1990s, and cardiolite study  in 2005 showing EF 54%  . Hyperlipidemia   . Hypertension   . TIA (transient ischemic attack)   . Degenerative disc disease     cervical spine  . Arrhythmia   . Atrial fibrillation 03/2011  . Myocardial infarction 1992 and 1994  . Angina     "jaw & elbow was the worse"  . Pneumonia ~ 1935  . GERD (gastroesophageal reflux disease)   . Skin cancer     forehead  . Urgency incontinence   . Bilateral cellulitis of lower leg    Past Surgical History  Procedure Laterality Date  . Cataract extraction w/ intraocular lens  implant, bilateral    . Cardiac catheterization  1990s  . Skin cancer excision  12/2011    forehead    Allergies  Allergies  Allergen Reactions  . Levofloxacin Rash    "thought they were sticking swords thru me"    HPI  [name] returns today for evaluation and management of his history of coronary artery disease, history of chronic A. fib, anticoagulation. He also has carotid artery disease which is followed by the vascular surgery group. His last sono carotid was unchanged from prior there is B/L < 40% stenosis. They have been stable. He denies any symptoms of TIAs.  He still very active doing all of his yard work. He was scraping some snow this morning. He denies any chest pain, shortness of breath, palpitations presyncope or syncope. He's had no edema. He denies any bleeding or melena. He is overdue for blood work.  Home Medications  Prior to Admission medications   Medication Sig Start Date End  Date Taking? Authorizing Provider  aspirin 81 MG tablet Take 81 mg by mouth at bedtime.     Historical Provider, MD  atorvastatin (LIPITOR) 10 MG tablet TAKE 1 TABLET AT BEDTIME 10/03/13   Dorothy Spark, MD  Calcium-Vitamin D (CALTRATE 600 PLUS-VIT D PO) Take 1 tablet by mouth at bedtime.     Historical Provider, MD  digoxin (LANOXIN) 0.125 MG tablet Take 0.125 mg by mouth daily.    Historical Provider, MD  furosemide (LASIX) 20 MG tablet Take 20 mg by mouth daily.    Historical Provider, MD  levothyroxine (SYNTHROID, LEVOTHROID) 100 MCG tablet TAKE 1 TABLET DAILY 09/22/13   Dorena Cookey, MD  methotrexate (RHEUMATREX) 2.5 MG tablet Take 5-7.5 mg by mouth 2 (two) times a week. Take 3 tablets on Saturday and 2 tablets on sunday    Historical Provider, MD  metoprolol succinate (TOPROL-XL) 50 MG 24 hr tablet Take 50 mg by mouth 2 (two) times daily. Take with or immediately following a meal.    Historical Provider, MD  metoprolol succinate (TOPROL-XL) 50 MG 24 hr tablet TAKE 1 TABLET TWICE A DAY 11/27/13   Dorothy Spark, MD  Multiple Vitamin (MULTIVITAMIN WITH MINERALS) TABS tablet Take 1 tablet by mouth daily.    Historical Provider, MD  nitroGLYCERIN (NITROSTAT) 0.4 MG SL tablet Place  0.4 mg under the tongue every 5 (five) minutes as needed. For chest pain 02/24/11   Renella Cunas, MD  potassium chloride SA (K-DUR,KLOR-CON) 20 MEQ tablet Take 1 tablet (20 mEq total) by mouth daily. 07/05/13   Eulas Post, MD  traMADol (ULTRAM) 50 MG tablet Take 50 mg by mouth every 8 (eight) hours as needed. For pain    Historical Provider, MD  warfarin (COUMADIN) 2.5 MG tablet Take as directed by anticoagulation clinic 11/10/13   Dorena Cookey, MD    Family History  Family History  Problem Relation Age of Onset  . Heart disease Mother   . Malignant hyperthermia Mother   . Heart disease Father   . Malignant hyperthermia Father     Social History  History   Social History  . Marital Status:  Married    Spouse Name: N/A    Number of Children: N/A  . Years of Education: N/A   Occupational History  . Not on file.   Social History Main Topics  . Smoking status: Former Smoker -- 1.00 packs/day for 40 years    Types: Cigarettes  . Smokeless tobacco: Former Systems developer    Quit date: 11/26/1990     Comment: quit around 1992  . Alcohol Use: No     Comment: former, quit drinking in 1992  . Drug Use: No  . Sexual Activity: Not Currently   Other Topics Concern  . 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  Cardiovascular:  No chest pain, dyspnea on exertion, edema, orthopnea, palpitations, paroxysmal nocturnal dyspnea. Dermatological: No rash, lesions/masses Respiratory: No cough, dyspnea Urologic: No hematuria, dysuria Abdominal:   No nausea, vomiting, diarrhea, bright red blood per rectum, melena, or hematemesis Neurologic:  No visual changes, wkns, changes in mental status. All other systems reviewed and are otherwise negative except as noted above.  Physical Exam  There were no vitals taken for this visit.  General: Pleasant, NAD Psych: Normal affect. Neuro: Alert and oriented X 3. Moves all extremities spontaneously. HEENT: Normal  Neck: Supple without bruits or JVD. Lungs:  Resp regular and unlabored, CTA. Heart: RRR no s3, s4, or murmurs. Abdomen: Soft, non-tender, non-distended, BS + x 4.  Extremities: No clubbing, cyanosis or edema. DP/PT/Radials 2+ and equal bilaterally.  Labs:  No results found for this basename: CKTOTAL, CKMB, TROPONINI,  in the last 72 hours Lab Results  Component Value Date   WBC 9.0 09/08/2013   HGB 10.2* 09/08/2013   HCT 30.5* 09/08/2013   MCV 92.4 09/08/2013   PLT 157  09/08/2013   No results found for this basename: NA, K, CL, CO2, BUN, CREATININE, CALCIUM, LABALBU, PROT, BILITOT, ALKPHOS, ALT, AST, GLUCOSE,  in the last 168 hours Lab Results  Component Value Date   CHOL 132 12/29/2012   HDL 42.40 12/29/2012   LDLCALC 72 12/29/2012   TRIG 88.0 12/29/2012   No results found for this basename: DDIMER   No components found with this basename: POCBNP,   Accessory Clinical Findings  Echocardiogram 11/27/2011  - Left ventricle: The cavity size was normal. Systolic function was mildly reduced. The estimated ejection fraction was in the range of 45%  to 50%. Wall motion was normal; there were no regional wall motion abnormalities. - Mitral valve: Mild regurgitation. - Left atrium: The atrium was mildly to moderately dilated. - Right ventricle: The cavity size was mildly dilated. Wall thickness was normal. Systolic function was mildly reduced. - Right atrium: The atrium was moderately dilated. - Pulmonary arteries: Systolic pressure was mildly to moderately increased.  ECG - atrial fibrillation, rate controlled 64 BPM, nonspecific ST abnormality   Assessment & Plan  1. CAD - stable, continue ASA, lipitor, metoprolol XL  2. Chronic systolic CHF - LVEF [id number], on lasix 20 mg po daily, the patient is euvolemic, the last lab K 3.3, we will recheck today  3. Carotid disease - US carotid in 05/2013 less than 40% stenosis bilaterally   4. Chronic persistent A-fib - rate controlled, INR therapeutic, no falls  5. Lipids - we will check  BMP today, follow up in 1 year  Dorothy Spark, MD, [facility] 12/22/2013, 1:54 PM

## 2013-12-28 ENCOUNTER — Other Ambulatory Visit (INDEPENDENT_AMBULATORY_CARE_PROVIDER_SITE_OTHER): Payer: Medicare Other

## 2013-12-28 DIAGNOSIS — E785 Hyperlipidemia, unspecified: Secondary | ICD-10-CM | POA: Diagnosis not present

## 2013-12-28 LAB — BASIC METABOLIC PANEL
BUN: 7 mg/dL (ref 6–23)
CO2: 30 mEq/L (ref 19–32)
Calcium: 8.6 mg/dL (ref 8.4–10.5)
Chloride: 102 mEq/L (ref 96–112)
Creatinine, Ser: 0.8 mg/dL (ref 0.4–1.5)
GFR: 94.58 mL/min (ref >60.00)
Glucose, Bld: 85 mg/dL (ref 70–99)
Potassium: 4.2 mEq/L (ref 3.5–5.1)
Sodium: 138 mEq/L (ref 135–145)

## 2013-12-28 LAB — LIPID PANEL
Cholesterol: 101 mg/dL (ref 0–200)
HDL: 32.7 mg/dL — ABNORMAL LOW (ref >39.00)
LDL Cholesterol: 54 mg/dL (ref 0–99)
Total CHOL/HDL Ratio: 3
Triglycerides: 71 mg/dL (ref 0.0–149.0)
VLDL: 14.2 mg/dL (ref 0.0–40.0)

## 2013-12-31 ENCOUNTER — Other Ambulatory Visit: Payer: Self-pay | Admitting: Cardiology

## 2014-01-02 ENCOUNTER — Ambulatory Visit (INDEPENDENT_AMBULATORY_CARE_PROVIDER_SITE_OTHER): Payer: Medicare Other | Admitting: General Practice

## 2014-01-02 DIAGNOSIS — Z8679 Personal history of other diseases of the circulatory system: Secondary | ICD-10-CM

## 2014-01-02 DIAGNOSIS — Z5181 Encounter for therapeutic drug level monitoring: Secondary | ICD-10-CM | POA: Diagnosis not present

## 2014-01-02 DIAGNOSIS — Z7901 Long term (current) use of anticoagulants: Secondary | ICD-10-CM

## 2014-01-02 DIAGNOSIS — I4891 Unspecified atrial fibrillation: Secondary | ICD-10-CM | POA: Diagnosis not present

## 2014-01-02 LAB — POCT INR: INR: 3.3

## 2014-01-02 NOTE — Progress Notes (Signed)
Pre-visit discussion using our clinic review tool. No additional management support is needed unless otherwise documented below in the visit note.  

## 2014-01-09 ENCOUNTER — Other Ambulatory Visit: Payer: Self-pay | Admitting: Vascular Surgery

## 2014-01-09 DIAGNOSIS — I6529 Occlusion and stenosis of unspecified carotid artery: Secondary | ICD-10-CM

## 2014-01-20 DIAGNOSIS — D235 Other benign neoplasm of skin of trunk: Secondary | ICD-10-CM | POA: Diagnosis not present

## 2014-01-20 DIAGNOSIS — Z85828 Personal history of other malignant neoplasm of skin: Secondary | ICD-10-CM | POA: Diagnosis not present

## 2014-01-20 DIAGNOSIS — C44319 Basal cell carcinoma of skin of other parts of face: Secondary | ICD-10-CM | POA: Diagnosis not present

## 2014-01-20 DIAGNOSIS — L570 Actinic keratosis: Secondary | ICD-10-CM | POA: Diagnosis not present

## 2014-01-30 ENCOUNTER — Other Ambulatory Visit: Payer: Self-pay | Admitting: General Practice

## 2014-01-30 ENCOUNTER — Ambulatory Visit (INDEPENDENT_AMBULATORY_CARE_PROVIDER_SITE_OTHER): Payer: Medicare Other | Admitting: General Practice

## 2014-01-30 DIAGNOSIS — Z7901 Long term (current) use of anticoagulants: Secondary | ICD-10-CM

## 2014-01-30 DIAGNOSIS — I4891 Unspecified atrial fibrillation: Secondary | ICD-10-CM | POA: Diagnosis not present

## 2014-01-30 DIAGNOSIS — Z8679 Personal history of other diseases of the circulatory system: Secondary | ICD-10-CM

## 2014-01-30 LAB — POCT INR: INR: 2.4

## 2014-01-30 MED ORDER — WARFARIN SODIUM 5 MG PO TABS: ORAL_TABLET | ORAL | Status: DC

## 2014-01-30 NOTE — Progress Notes (Signed)
Pre visit review using our clinic review tool, if applicable. No additional management support is needed unless otherwise documented below in the visit note. 

## 2014-02-01 DIAGNOSIS — M069 Rheumatoid arthritis, unspecified: Secondary | ICD-10-CM | POA: Diagnosis not present

## 2014-02-01 DIAGNOSIS — Z79899 Other long term (current) drug therapy: Secondary | ICD-10-CM | POA: Diagnosis not present

## 2014-02-07 DIAGNOSIS — Z79899 Other long term (current) drug therapy: Secondary | ICD-10-CM | POA: Diagnosis not present

## 2014-02-07 DIAGNOSIS — M069 Rheumatoid arthritis, unspecified: Secondary | ICD-10-CM | POA: Diagnosis not present

## 2014-02-08 ENCOUNTER — Other Ambulatory Visit: Payer: Self-pay | Admitting: Cardiology

## 2014-02-27 ENCOUNTER — Ambulatory Visit: Payer: Medicare Other

## 2014-03-01 ENCOUNTER — Ambulatory Visit (INDEPENDENT_AMBULATORY_CARE_PROVIDER_SITE_OTHER): Payer: Medicare Other | Admitting: Family

## 2014-03-01 DIAGNOSIS — Z8679 Personal history of other diseases of the circulatory system: Secondary | ICD-10-CM | POA: Diagnosis not present

## 2014-03-01 DIAGNOSIS — Z5181 Encounter for therapeutic drug level monitoring: Secondary | ICD-10-CM

## 2014-03-01 DIAGNOSIS — I4891 Unspecified atrial fibrillation: Secondary | ICD-10-CM

## 2014-03-01 DIAGNOSIS — Z79899 Other long term (current) drug therapy: Secondary | ICD-10-CM | POA: Diagnosis not present

## 2014-03-01 DIAGNOSIS — Z7901 Long term (current) use of anticoagulants: Secondary | ICD-10-CM

## 2014-03-01 DIAGNOSIS — D529 Folate deficiency anemia, unspecified: Secondary | ICD-10-CM | POA: Diagnosis not present

## 2014-03-01 DIAGNOSIS — M069 Rheumatoid arthritis, unspecified: Secondary | ICD-10-CM | POA: Diagnosis not present

## 2014-03-01 LAB — POCT INR: INR: 2.5

## 2014-03-01 NOTE — Patient Instructions (Signed)
Continue to take 1 tablet daily except 1 1/2 tablets on Monday/Thursday.  Re-check in 4 weeks.   Anticoagulation Dose Instructions as of 03/01/2014     Dorene Grebe Tue Wed Thu Fri Sat   New Dose 5 mg 7.5 mg 5 mg 5 mg 7.5 mg 5 mg 5 mg    Description       Continue to take 1 tablet daily except 1 1/2 tablets on Monday/Thursday.  Re-check in 4 weeks.

## 2014-03-11 ENCOUNTER — Other Ambulatory Visit: Payer: Self-pay | Admitting: Family Medicine

## 2014-03-30 ENCOUNTER — Ambulatory Visit (INDEPENDENT_AMBULATORY_CARE_PROVIDER_SITE_OTHER): Payer: Medicare Other | Admitting: General Practice

## 2014-03-30 DIAGNOSIS — Z8679 Personal history of other diseases of the circulatory system: Secondary | ICD-10-CM

## 2014-03-30 DIAGNOSIS — I4891 Unspecified atrial fibrillation: Secondary | ICD-10-CM

## 2014-03-30 DIAGNOSIS — Z5181 Encounter for therapeutic drug level monitoring: Secondary | ICD-10-CM

## 2014-03-30 LAB — POCT INR: INR: 1.7

## 2014-03-30 NOTE — Progress Notes (Signed)
Pre visit review using our clinic review tool, if applicable. No additional management support is needed unless otherwise documented below in the visit note. 

## 2014-04-27 ENCOUNTER — Ambulatory Visit (INDEPENDENT_AMBULATORY_CARE_PROVIDER_SITE_OTHER): Payer: Medicare Other | Admitting: General Practice

## 2014-04-27 DIAGNOSIS — Z7901 Long term (current) use of anticoagulants: Secondary | ICD-10-CM | POA: Diagnosis not present

## 2014-04-27 DIAGNOSIS — Z8679 Personal history of other diseases of the circulatory system: Secondary | ICD-10-CM

## 2014-04-27 DIAGNOSIS — Z5181 Encounter for therapeutic drug level monitoring: Secondary | ICD-10-CM

## 2014-04-27 DIAGNOSIS — I4891 Unspecified atrial fibrillation: Secondary | ICD-10-CM | POA: Diagnosis not present

## 2014-04-27 LAB — POCT INR: INR: 2.8

## 2014-04-27 NOTE — Progress Notes (Signed)
Pre visit review using our clinic review tool, if applicable. No additional management support is needed unless otherwise documented below in the visit note. 

## 2014-05-05 DIAGNOSIS — M069 Rheumatoid arthritis, unspecified: Secondary | ICD-10-CM | POA: Diagnosis not present

## 2014-05-05 DIAGNOSIS — Z79899 Other long term (current) drug therapy: Secondary | ICD-10-CM | POA: Diagnosis not present

## 2014-05-15 ENCOUNTER — Other Ambulatory Visit: Payer: Self-pay | Admitting: *Deleted

## 2014-05-15 MED ORDER — HYDROCODONE-HOMATROPINE 5-1.5 MG/5ML PO SYRP: ORAL_SOLUTION | ORAL | Status: DC

## 2014-05-15 MED ORDER — ZOSTER VACCINE LIVE 19400 UNT/0.65ML ~~LOC~~ SOLR: 0.6500 mL | Freq: Once | SUBCUTANEOUS | Status: DC

## 2014-05-19 ENCOUNTER — Encounter (HOSPITAL_COMMUNITY): Payer: Self-pay | Admitting: Emergency Medicine

## 2014-05-19 ENCOUNTER — Emergency Department (HOSPITAL_COMMUNITY): Payer: Medicare Other

## 2014-05-19 ENCOUNTER — Emergency Department (HOSPITAL_COMMUNITY)
Admission: EM | Admit: 2014-05-19 | Discharge: 2014-05-19 | Disposition: A | Discharge status: Home / Self Care | Payer: Medicare Other | Attending: Emergency Medicine | Admitting: Emergency Medicine

## 2014-05-19 ENCOUNTER — Other Ambulatory Visit: Payer: Self-pay

## 2014-05-19 DIAGNOSIS — I252 Old myocardial infarction: Secondary | ICD-10-CM | POA: Insufficient documentation

## 2014-05-19 DIAGNOSIS — Z85828 Personal history of other malignant neoplasm of skin: Secondary | ICD-10-CM | POA: Diagnosis not present

## 2014-05-19 DIAGNOSIS — I4891 Unspecified atrial fibrillation: Secondary | ICD-10-CM | POA: Insufficient documentation

## 2014-05-19 DIAGNOSIS — I2510 Atherosclerotic heart disease of native coronary artery without angina pectoris: Secondary | ICD-10-CM | POA: Diagnosis not present

## 2014-05-19 DIAGNOSIS — I209 Angina pectoris, unspecified: Secondary | ICD-10-CM | POA: Insufficient documentation

## 2014-05-19 DIAGNOSIS — Z872 Personal history of diseases of the skin and subcutaneous tissue: Secondary | ICD-10-CM | POA: Insufficient documentation

## 2014-05-19 DIAGNOSIS — R05 Cough: Secondary | ICD-10-CM | POA: Diagnosis not present

## 2014-05-19 DIAGNOSIS — Z8673 Personal history of transient ischemic attack (TIA), and cerebral infarction without residual deficits: Secondary | ICD-10-CM | POA: Insufficient documentation

## 2014-05-19 DIAGNOSIS — Z8701 Personal history of pneumonia (recurrent): Secondary | ICD-10-CM | POA: Diagnosis not present

## 2014-05-19 DIAGNOSIS — E785 Hyperlipidemia, unspecified: Secondary | ICD-10-CM | POA: Insufficient documentation

## 2014-05-19 DIAGNOSIS — R110 Nausea: Secondary | ICD-10-CM | POA: Diagnosis not present

## 2014-05-19 DIAGNOSIS — Z9861 Coronary angioplasty status: Secondary | ICD-10-CM | POA: Insufficient documentation

## 2014-05-19 DIAGNOSIS — I10 Essential (primary) hypertension: Secondary | ICD-10-CM | POA: Insufficient documentation

## 2014-05-19 DIAGNOSIS — R079 Chest pain, unspecified: Secondary | ICD-10-CM | POA: Diagnosis not present

## 2014-05-19 DIAGNOSIS — Z87891 Personal history of nicotine dependence: Secondary | ICD-10-CM | POA: Diagnosis not present

## 2014-05-19 DIAGNOSIS — R0789 Other chest pain: Secondary | ICD-10-CM | POA: Diagnosis not present

## 2014-05-19 DIAGNOSIS — Z79899 Other long term (current) drug therapy: Secondary | ICD-10-CM | POA: Insufficient documentation

## 2014-05-19 DIAGNOSIS — R059 Cough, unspecified: Secondary | ICD-10-CM | POA: Diagnosis not present

## 2014-05-19 DIAGNOSIS — Z8719 Personal history of other diseases of the digestive system: Secondary | ICD-10-CM | POA: Diagnosis not present

## 2014-05-19 LAB — BASIC METABOLIC PANEL
BUN: 6 mg/dL (ref 6–23)
CO2: 32 mEq/L (ref 19–32)
Calcium: 9 mg/dL (ref 8.4–10.5)
Chloride: 99 mEq/L (ref 96–112)
Creatinine, Ser: 0.88 mg/dL (ref 0.50–1.35)
GFR calc Af Amer: 88 mL/min — ABNORMAL LOW (ref >90)
GFR, EST NON AFRICAN AMERICAN: 76 mL/min — AB (ref >90)
Glucose, Bld: 98 mg/dL (ref 70–99)
POTASSIUM: 3.9 meq/L (ref 3.7–5.3)
SODIUM: 140 meq/L (ref 137–147)

## 2014-05-19 LAB — CBC
HCT: 31.9 % — ABNORMAL LOW (ref 39.0–52.0)
Hemoglobin: 10.2 g/dL — ABNORMAL LOW (ref 13.0–17.0)
MCH: 30.2 pg (ref 26.0–34.0)
MCHC: 32 g/dL (ref 30.0–36.0)
MCV: 94.4 fL (ref 78.0–100.0)
Platelets: 203 10*3/uL (ref 150–400)
RBC: 3.38 MIL/uL — ABNORMAL LOW (ref 4.22–5.81)
RDW: 19 % — AB (ref 11.5–15.5)
WBC: 5.5 10*3/uL (ref 4.0–10.5)

## 2014-05-19 LAB — I-STAT TROPONIN, ED
TROPONIN I, POC: 0.01 ng/mL (ref 0.00–0.08)
TROPONIN I, POC: 0.01 ng/mL (ref 0.00–0.08)

## 2014-05-19 LAB — PRO B NATRIURETIC PEPTIDE: Pro B Natriuretic peptide (BNP): 3398 pg/mL — ABNORMAL HIGH (ref 0–450)

## 2014-05-19 LAB — URINALYSIS, ROUTINE W REFLEX MICROSCOPIC
Bilirubin Urine: NEGATIVE
Glucose, UA: NEGATIVE mg/dL
HGB URINE DIPSTICK: NEGATIVE
Ketones, ur: NEGATIVE mg/dL
LEUKOCYTES UA: NEGATIVE
Nitrite: NEGATIVE
PROTEIN: NEGATIVE mg/dL
Specific Gravity, Urine: 1.012 (ref 1.005–1.030)
Urobilinogen, UA: 0.2 mg/dL (ref 0.0–1.0)
pH: 6.5 (ref 5.0–8.0)

## 2014-05-19 LAB — PROTIME-INR
INR: 4.35 — AB (ref 0.00–1.49)
PROTHROMBIN TIME: 41.6 s — AB (ref 11.6–15.2)

## 2014-05-19 MED ORDER — NITROGLYCERIN 0.4 MG SL SUBL: 0.4000 mg | SUBLINGUAL_TABLET | SUBLINGUAL | Status: DC | PRN

## 2014-05-19 MED ORDER — ASPIRIN 81 MG PO CHEW
324.0000 mg | CHEWABLE_TABLET | Freq: Once | ORAL | Status: AC
  Administered 2014-05-19: 324 mg via ORAL
  Filled 2014-05-19: qty 4

## 2014-05-19 NOTE — ED Provider Notes (Signed)
CSN: [id number]     Arrival date & time 05/19/14  1504 History   First MD Initiated Contact with Patient 05/19/14 1515     Chief Complaint  Patient presents with  . Chest Pain     (Consider location/radiation/quality/duration/timing/severity/associated sxs/prior Treatment) Patient is a 80 y.o. male presenting with chest pain. The history is provided by the patient and a relative.  Chest Pain  He was at home alone, when he developed chest pain about 1 PM. Shortly after that he took 3 sublingual nitroglycerin tablets, over the course of 5-10 minutes. His pain was described as pressure, 4/10. The pain resolved shortly after he took his last sublingual nitroglycerin tablets. He has not had any aspirin yet today. He takes his aspirin at nighttime. He felt weak and flushed, and noted some nausea, after taking the nitroglycerin. There was no associated shortness of breath, dizziness, or vomiting. He has not taken nitroglycerin in the last year. There are no other known modifying factors.   Past Medical History  Diagnosis Date  . Carotid artery occlusion     right total internal artery occlusion  . Coronary artery disease     s/p cardiac cath in 1990s, and cardiolite study  in 2005 showing EF 54%  . Hyperlipidemia   . Hypertension   . TIA (transient ischemic attack)   . Degenerative disc disease     cervical spine  . Arrhythmia   . Atrial fibrillation 03/2011  . Myocardial infarction 1992 and 1994  . Angina     "jaw & elbow was the worse"  . Pneumonia ~ 1935  . GERD (gastroesophageal reflux disease)   . Skin cancer     forehead  . Urgency incontinence   . Bilateral cellulitis of lower leg    Past Surgical History  Procedure Laterality Date  . Cataract extraction w/ intraocular lens  implant, bilateral    . Cardiac catheterization  1990s  . Skin cancer excision  12/2011    forehead   Family History  Problem Relation Age of Onset  . Heart disease Mother   . Malignant hyperthermia  Mother   . Heart disease Father   . Malignant hyperthermia Father    History  Substance Use Topics  . Smoking status: Former Smoker -- 1.00 packs/day for 40 years    Types: Cigarettes  . Smokeless tobacco: Former Systems developer    Quit date: 11/26/1990     Comment: quit around 1992  . Alcohol Use: No     Comment: former, quit drinking in 1992    Review of Systems  Cardiovascular: Positive for chest pain.  All other systems reviewed and are negative.     Allergies  Levofloxacin and Klor-con  Home Medications   Prior to Admission medications   Medication Sig Start Date End Date Taking? Authorizing Provider  aspirin 81 MG tablet Take 81 mg by mouth at bedtime.    Yes Historical Provider, MD  atorvastatin (LIPITOR) 10 MG tablet TAKE 1 TABLET AT BEDTIME 10/03/13  Yes Dorothy Spark, MD  Calcium-Vitamin D (CALTRATE 600 PLUS-VIT D PO) Take 1 tablet by mouth at bedtime.    Yes Historical Provider, MD  digoxin (LANOXIN) 0.125 MG tablet Take 0.125 mg by mouth daily.   Yes Historical Provider, MD  furosemide (LASIX) 20 MG tablet Take 20 mg by mouth daily.   Yes Historical Provider, MD  HYDROcodone-homatropine [facility]) 5-1.5 MG/5ML syrup 1/2 - 1 teaspoon at bedtime as needed for cough 05/15/14  Yes Dellis Filbert  Delora Fuel, MD  levothyroxine (SYNTHROID, LEVOTHROID) 100 MCG tablet TAKE 1 TABLET DAILY 09/22/13  Yes Dorena Cookey, MD  methotrexate (RHEUMATREX) 2.5 MG tablet Take 5-7.5 mg by mouth 2 (two) times a week. Take 3 tablets on Saturday and 2 tablets on sunday   Yes Historical Provider, MD  metoprolol succinate (TOPROL-XL) 50 MG 24 hr tablet Take 50 mg by mouth 2 (two) times daily. Take with or immediately following a meal.   Yes Historical Provider, MD  Multiple Vitamin (MULTIVITAMIN WITH MINERALS) TABS tablet Take 1 tablet by mouth daily.   Yes Historical Provider, MD  nitroGLYCERIN (NITROSTAT) 0.4 MG SL tablet Place 0.4 mg under the tongue every 5 (five) minutes as needed. For chest pain 02/24/11   Yes Renella Cunas, MD  traMADol (ULTRAM) 50 MG tablet Take 50 mg by mouth every 8 (eight) hours as needed. For pain   Yes Historical Provider, MD  warfarin (COUMADIN) 5 MG tablet 5-7.5 mg. Take as directed by anticoagulation clinic.  Patient takes 5 mg every day except on Tuesday and Thursday patient takes 7.5 mg 01/30/14  Yes Dorena Cookey, MD  nitroGLYCERIN (NITROSTAT) 0.4 MG SL tablet Place 1 tablet (0.4 mg total) under the tongue every 5 (five) minutes as needed for chest pain. 05/19/14   Richarda Blade, MD  zoster vaccine live, PF, (ZOSTAVAX) 25852 UNT/0.65ML injection Inject 19,400 Units into the skin once. 05/15/14   Dorena Cookey, MD   BP 146/65  Pulse 71  Temp(Src) 98 F (36.7 C) (Oral)  Resp 25  SpO2 97% Physical Exam  Nursing note and vitals reviewed. Constitutional: He is oriented to person, place, and time. He appears well-developed.  Elderly, frail  HENT:  Head: Normocephalic and atraumatic.  Right Ear: External ear normal.  Left Ear: External ear normal.  Eyes: Conjunctivae and EOM are normal. Pupils are equal, round, and reactive to light.  Neck: Normal range of motion and phonation normal. Neck supple.  Cardiovascular: Normal rate, regular rhythm, normal heart sounds and intact distal pulses.   Pulmonary/Chest: Effort normal and breath sounds normal. He exhibits no bony tenderness.  Abdominal: Soft. There is no tenderness.  Musculoskeletal: Normal range of motion.  Neurological: He is alert and oriented to person, place, and time. No cranial nerve deficit or sensory deficit. He exhibits normal muscle tone. Coordination normal.  Skin: Skin is warm, dry and intact.  Psychiatric: He has a normal mood and affect. His behavior is normal. Judgment and thought content normal.    ED Course  Procedures (including critical care time) Medications  aspirin chewable tablet 324 mg (324 mg Oral Given 05/19/14 1556)    Patient Vitals for the past 24 hrs:  BP Temp Temp src Pulse  Resp SpO2  05/19/14 1900 146/65 mmHg - - 71 25 97 %  05/19/14 1845 148/67 mmHg - - 61 13 99 %  05/19/14 1830 148/60 mmHg - - 61 19 99 %  05/19/14 1810 155/81 mmHg - - 73 20 100 %  05/19/14 1800 151/81 mmHg - - 55 21 94 %  05/19/14 1745 157/72 mmHg - - 61 18 100 %  05/19/14 1700 165/79 mmHg - - 58 19 100 %  05/19/14 1615 143/80 mmHg - - 51 14 100 %  05/19/14 1600 150/67 mmHg - - 55 16 100 %  05/19/14 1545 143/70 mmHg - - 81 17 100 %  05/19/14 1530 128/69 mmHg - - 58 15 99 %  05/19/14 1515 146/68 mmHg - -  59 20 100 %  05/19/14 1508 135/62 mmHg 98 F (36.7 C) Oral 72 20 99 %  05/19/14 1504 - - - - - 96 %    7:49 PM Reevaluation with update and discussion. After initial assessment and treatment, an updated evaluation reveals he remains comfortable. Findings discussed with patient and family members, all questions answered. [place] - Abnormal; Notable for the following:    Prothrombin Time 41.6 (*)    INR 4.35 (*)    All other components within normal limits  CBC - Abnormal; Notable for the following:    RBC 3.38 (*)    Hemoglobin 10.2 (*)    HCT 31.9 (*)    RDW 19.0 (*)    All other components within normal limits  BASIC METABOLIC PANEL - Abnormal; Notable for the following:    GFR calc non Af Amer 76 (*)    GFR calc Af Amer 88 (*)    All other components within normal limits  PRO B NATRIURETIC PEPTIDE - Abnormal; Notable for the following:    Pro B Natriuretic peptide (BNP) 3398.0 (*)    All other components within normal limits  URINALYSIS, ROUTINE W REFLEX MICROSCOPIC  I-STAT TROPOININ, ED  Randolm Idol, ED    Imaging Review Dg Chest 2 View  05/19/2014   CLINICAL DATA:  Chest pressure.  Cough and congestion.  EXAM: CHEST  2 VIEW  COMPARISON:  02/05/2012  FINDINGS: Hyper expansion is consistent with emphysema. The lungs are clear without focal infiltrate, edema, pneumothorax or pleural effusion. The  cardiopericardial silhouette is within normal limits for size. Imaged bony structures of the thorax are intact.  IMPRESSION: Hyperinflation without acute cardiopulmonary findings.   Electronically Signed   By: Misty Stanley M.D.   On: 05/19/2014 16:54     EKG Interpretation   Date/Time:  Friday May 19 2014 15:04:27 EDT Ventricular Rate:  56 PR Interval:    QRS Duration: 102 QT Interval:  419 QTC Calculation: 404 R Axis:   71 Text Interpretation:  Atrial fibrillation Low voltage, extremity leads  Nonspecific repol abnormality, diffuse leads Since last tracing rate  slower Confirmed by [facility]  MD, ELLIOTT [phone]) on 05/19/2014 5:33:03 PM      MDM   Final diagnoses:  Chest pain, unspecified    Nonspecific chest pain, with history of remote coronary artery disease, and long-term stability presents with a episode of chest pain, which resolved with nitroglycerin. There's been no recurrence over a period of several hours. Patient is otherwise well.   Nursing Notes Reviewed/ Care Coordinated Applicable Imaging Reviewed Interpretation of Laboratory Data incorporated into ED treatment  The patient appears reasonably screened and/or stabilized for discharge and I doubt any other medical condition or other [facility] requiring further screening, evaluation, or treatment in the ED at this time prior to discharge.  Plan: Home Medications- NTG Rx; Home Treatments- rest; return here if the recommended treatment, does not improve the symptoms; Recommended follow up- Cardiology f/u asap for Stress test    Richarda Blade, MD 05/19/14 1950

## 2014-05-19 NOTE — ED Notes (Signed)
Pt presents from home via [facility] with c/o 10/10 chest pressure that began at 1300, pt took 3SL nitro prior to EMS arrival with complete relief of the pressure. Pt has hx Afib and EMS found Afib on the monitor with rate between 56-70. Per EMS pt was pale on their arrival, pt's skin is warm, pink, and dry at this time. Pt denies pain at this time, is calm interactive and in NAD.

## 2014-05-19 NOTE — Discharge Instructions (Signed)
Follow up with your cardiologist by telephone on Monday. To them that we recommend that you get a stress test done as soon as possible. You can use nitroglycerin for further episodes of chest pain. Return here if you feel like you're not getting better or having too much discomfort.     Chest Pain (Nonspecific) It is often hard to give a specific diagnosis for the cause of chest pain. There is always a chance that your pain could be related to something serious, such as a heart attack or a blood clot in the lungs. You need to follow up with your health care provider for further evaluation. CAUSES   Heartburn.  Pneumonia or bronchitis.  Anxiety or stress.  Inflammation around your heart (pericarditis) or lung (pleuritis or pleurisy).  A blood clot in the lung.  A collapsed lung (pneumothorax). It can develop suddenly on its own (spontaneous pneumothorax) or from trauma to the chest.  Shingles infection (herpes zoster virus). The chest wall is composed of bones, muscles, and cartilage. Any of these can be the source of the pain.  The bones can be bruised by injury.  The muscles or cartilage can be strained by coughing or overwork.  The cartilage can be affected by inflammation and become sore (costochondritis). DIAGNOSIS  Lab tests or other studies may be needed to find the cause of your pain. Your health care provider may have you take a test called an ambulatory electrocardiogram (ECG). An ECG records your heartbeat patterns over a 24-hour period. You may also have other tests, such as:  Transthoracic echocardiogram (TTE). During echocardiography, sound waves are used to evaluate how blood flows through your heart.  Transesophageal echocardiogram (TEE).  Cardiac monitoring. This allows your health care provider to monitor your heart rate and rhythm in real time.  Holter monitor. This is a portable device that records your heartbeat and can help diagnose heart arrhythmias. It  allows your health care provider to track your heart activity for several days, if needed.  Stress tests by exercise or by giving medicine that makes the heart beat faster. TREATMENT   Treatment depends on what may be causing your chest pain. Treatment may include:  Acid blockers for heartburn.  Anti-inflammatory medicine.  Pain medicine for inflammatory conditions.  Antibiotics if an infection is present.  You may be advised to change lifestyle habits. This includes stopping smoking and avoiding alcohol, caffeine, and chocolate.  You may be advised to keep your head raised (elevated) when sleeping. This reduces the chance of acid going backward from your stomach into your esophagus. Most of the time, nonspecific chest pain will improve within 2-3 days with rest and mild pain medicine.  HOME CARE INSTRUCTIONS   If antibiotics were prescribed, take them as directed. Finish them even if you start to feel better.  For the next few days, avoid physical activities that bring on chest pain. Continue physical activities as directed.  Do not use any tobacco products, including cigarettes, chewing tobacco, or electronic cigarettes.  Avoid drinking alcohol.  Only take medicine as directed by your health care provider.  Follow your health care provider's suggestions for further testing if your chest pain does not go away.  Keep any follow-up appointments you made. If you do not go to an appointment, you could develop lasting (chronic) problems with pain. If there is any problem keeping an appointment, call to reschedule. SEEK MEDICAL CARE IF:   Your chest pain does not go away, even after treatment.  You have a rash with blisters on your chest.  You have a fever. SEEK IMMEDIATE MEDICAL CARE IF:   You have increased chest pain or pain that spreads to your arm, neck, jaw, back, or abdomen.  You have shortness of breath.  You have an increasing cough, or you cough up blood.  You  have severe back or abdominal pain.  You feel nauseous or vomit.  You have severe weakness.  You faint.  You have chills. This is an emergency. Do not wait to see if the pain will go away. Get medical help at once. Call your local emergency services (911 in [place] Do not drive yourself to the hospital. MAKE SURE YOU:   Understand these instructions.  Will watch your condition.  Will get help right away if you are not doing well or get worse. Document Released: 08/20/2005 Document Revised: 11/15/2013 Document Reviewed: 06/15/2008 [facility] Patient Information 2015 [place], [place]. This information is not intended to replace advice given to you by your health care provider. Make sure you discuss any questions you have with your health care provider.

## 2014-05-22 ENCOUNTER — Telehealth: Payer: Self-pay | Admitting: Cardiology

## 2014-05-22 DIAGNOSIS — R079 Chest pain, unspecified: Secondary | ICD-10-CM

## 2014-05-22 NOTE — Telephone Encounter (Signed)
Pt was seen in the ED on 6/26 for chest pain.   Per discharge instructions this pt should have a stress test done and follow-up with Dr Meda Coffee.   Will forward this message to Dr Meda Coffee for further review and further recommendation.

## 2014-05-22 NOTE — Addendum Note (Signed)
Addended by: Nuala Alpha on: 05/22/2014 05:52 PM   Modules accepted: Orders

## 2014-05-22 NOTE — Telephone Encounter (Signed)
Ivy, Please schedule a Lexiscan nuclear stress test and follow up appointment (my first available appointment) Thank you, K

## 2014-05-22 NOTE — Telephone Encounter (Signed)
Per Dr Meda Coffee this pt needs to be scheduled for  a Lexiscan nuclear stress test and follow up appointment (her first available appointment) for recent ED visit for chest pain.  Pt aware of this and knows that someone from our scheduling department will be contacting him to set this up.  Pt verbalized understanding and agrees with this plan.

## 2014-05-22 NOTE — Telephone Encounter (Signed)
New message         Pt says hospital is ordering him to have a stress test / Could you put the order in the system?

## 2014-05-23 ENCOUNTER — Telehealth: Payer: Self-pay | Admitting: *Deleted

## 2014-05-23 NOTE — Telephone Encounter (Signed)
Went over [employer] with pt and informed him that I will mail them to him as well, for his review.  Pt verbalized understanding and very grateful for all the assistance provided.

## 2014-05-25 ENCOUNTER — Ambulatory Visit (INDEPENDENT_AMBULATORY_CARE_PROVIDER_SITE_OTHER): Payer: Medicare Other | Admitting: General Practice

## 2014-05-25 DIAGNOSIS — Z5181 Encounter for therapeutic drug level monitoring: Secondary | ICD-10-CM

## 2014-05-25 DIAGNOSIS — Z8679 Personal history of other diseases of the circulatory system: Secondary | ICD-10-CM | POA: Diagnosis not present

## 2014-05-25 DIAGNOSIS — Z7901 Long term (current) use of anticoagulants: Secondary | ICD-10-CM | POA: Diagnosis not present

## 2014-05-25 DIAGNOSIS — I4891 Unspecified atrial fibrillation: Secondary | ICD-10-CM | POA: Diagnosis not present

## 2014-05-25 LAB — POCT INR: INR: 5

## 2014-05-25 NOTE — Progress Notes (Signed)
Pre visit review using our clinic review tool, if applicable. No additional management support is needed unless otherwise documented below in the visit note. 

## 2014-05-29 ENCOUNTER — Encounter: Payer: Self-pay | Admitting: Cardiology

## 2014-05-29 ENCOUNTER — Other Ambulatory Visit: Payer: Self-pay | Admitting: General Practice

## 2014-05-29 ENCOUNTER — Ambulatory Visit (INDEPENDENT_AMBULATORY_CARE_PROVIDER_SITE_OTHER): Payer: Medicare Other | Admitting: General Practice

## 2014-05-29 ENCOUNTER — Encounter: Payer: Self-pay | Admitting: Vascular Surgery

## 2014-05-29 DIAGNOSIS — Z5181 Encounter for therapeutic drug level monitoring: Secondary | ICD-10-CM | POA: Diagnosis not present

## 2014-05-29 DIAGNOSIS — Z7901 Long term (current) use of anticoagulants: Secondary | ICD-10-CM | POA: Diagnosis not present

## 2014-05-29 DIAGNOSIS — Z8679 Personal history of other diseases of the circulatory system: Secondary | ICD-10-CM

## 2014-05-29 DIAGNOSIS — I4891 Unspecified atrial fibrillation: Secondary | ICD-10-CM | POA: Diagnosis not present

## 2014-05-29 LAB — POCT INR: INR: 1.6

## 2014-05-29 MED ORDER — WARFARIN SODIUM 5 MG PO TABS: ORAL_TABLET | ORAL | Status: DC

## 2014-05-29 NOTE — Progress Notes (Signed)
Pre visit review using our clinic review tool, if applicable. No additional management support is needed unless otherwise documented below in the visit note. 

## 2014-05-30 ENCOUNTER — Other Ambulatory Visit (HOSPITAL_COMMUNITY): Payer: Medicare Other

## 2014-05-30 ENCOUNTER — Ambulatory Visit: Payer: Medicare Other | Admitting: Vascular Surgery

## 2014-05-31 ENCOUNTER — Encounter (HOSPITAL_COMMUNITY): Payer: Medicare Other

## 2014-05-31 ENCOUNTER — Ambulatory Visit (HOSPITAL_COMMUNITY): Payer: Medicare Other | Attending: Cardiology | Admitting: Radiology

## 2014-05-31 VITALS — BP 116/57 | Ht 72.0 in | Wt 169.0 lb

## 2014-05-31 DIAGNOSIS — R0609 Other forms of dyspnea: Secondary | ICD-10-CM | POA: Insufficient documentation

## 2014-05-31 DIAGNOSIS — I2510 Atherosclerotic heart disease of native coronary artery without angina pectoris: Secondary | ICD-10-CM | POA: Insufficient documentation

## 2014-05-31 DIAGNOSIS — R0602 Shortness of breath: Secondary | ICD-10-CM | POA: Diagnosis not present

## 2014-05-31 DIAGNOSIS — R079 Chest pain, unspecified: Secondary | ICD-10-CM | POA: Diagnosis not present

## 2014-05-31 DIAGNOSIS — Z8249 Family history of ischemic heart disease and other diseases of the circulatory system: Secondary | ICD-10-CM | POA: Insufficient documentation

## 2014-05-31 DIAGNOSIS — R0989 Other specified symptoms and signs involving the circulatory and respiratory systems: Secondary | ICD-10-CM | POA: Insufficient documentation

## 2014-05-31 DIAGNOSIS — I252 Old myocardial infarction: Secondary | ICD-10-CM | POA: Insufficient documentation

## 2014-05-31 DIAGNOSIS — R002 Palpitations: Secondary | ICD-10-CM | POA: Diagnosis not present

## 2014-05-31 DIAGNOSIS — I4891 Unspecified atrial fibrillation: Secondary | ICD-10-CM | POA: Insufficient documentation

## 2014-05-31 DIAGNOSIS — I10 Essential (primary) hypertension: Secondary | ICD-10-CM | POA: Insufficient documentation

## 2014-05-31 DIAGNOSIS — Z87891 Personal history of nicotine dependence: Secondary | ICD-10-CM | POA: Diagnosis not present

## 2014-05-31 MED ORDER — REGADENOSON 0.4 MG/5ML IV SOLN
0.4000 mg | Freq: Once | INTRAVENOUS | Status: AC
  Administered 2014-05-31: 0.4 mg via INTRAVENOUS

## 2014-05-31 MED ORDER — TECHNETIUM TC 99M SESTAMIBI GENERIC - CARDIOLITE
33.0000 | Freq: Once | INTRAVENOUS | Status: AC | PRN
Start: 2014-05-31 — End: 2014-05-31
  Administered 2014-05-31: 33 via INTRAVENOUS

## 2014-05-31 MED ORDER — TECHNETIUM TC 99M SESTAMIBI GENERIC - CARDIOLITE
11.0000 | Freq: Once | INTRAVENOUS | Status: AC | PRN
Start: 2014-05-31 — End: 2014-05-31
  Administered 2014-05-31: 11 via INTRAVENOUS

## 2014-05-31 NOTE — Progress Notes (Signed)
[facility] [address] [place], [place] [address] [phone]    Cardiology Nuclear Med Study  OSHA ERRICO is a 80 y.o. male     MRN : [id number]     DOB: 06/06/1925  Procedure Date: 05/31/2014  Nuclear Med Background Indication for Stress Test:  Evaluation for Ischemia, [facility], Abnormal EKG and AFIB Non Specific ST,T changes, 6/15 ED CP R/O MI History:CAD;MI;cath (n/o CAD);Afib;Echo 13'EF:45-50%;Previous Nuclear Study 10(LEXI D/T LONG PAUSES, MILD APICAL THINNING Cardiac Risk Factors: Carotid Disease, Family History - CAD, History of Smoking, Hypertension and Lipids  Symptoms:  Chest Pain, DOE, Palpitations and SOB   Nuclear Pre-Procedure Caffeine/Decaff Intake:  None NPO After: 7:00pm   Lungs:  clear O2 Sat: 96% on room air. IV 0.9% NS with Angio Cath:  22g  IV Site: L Antecubital  IV Started by:  Perrin Maltese, EMT-P  Chest Size (in):  46 Cup Size: n/a  Height: 6' (1.829 m)  Weight:  169 lb (76.658 kg)  BMI:  Body mass index is 22.92 kg/(m^2). Tech Comments:  No Rx this am    Nuclear Med Study 1 or 2 day study: 1 day  Stress Test Type:  Carlton Adam  Reading MD: n/a  Order Authorizing Provider:  K.Nelson MD  Resting Radionuclide: Technetium 86m Sestamibi  Resting Radionuclide Dose: 11.0 mCi   Stress Radionuclide:  Technetium 60m Sestamibi  Stress Radionuclide Dose: 33.0 mCi           Stress Protocol Rest HR: 58 Stress HR: 79  Rest BP: 116/57 Stress BP: 168/99  Exercise Time (min): n/a METS: n/a   Predicted Max HR: 134 bpm % Max HR: 58.96 bpm Rate Pressure Product: 13272   Dose of Adenosine (mg):  n/a Dose of Lexiscan: 0.4 mg  Dose of Atropine (mg): n/a Dose of Dobutamine: n/a mcg/kg/min (at max HR)  Stress Test Technologist: Ileene Hutchinson, EMT-P  Nuclear Technologist:  Annye Rusk, CNMT     Rest Procedure:  Myocardial perfusion imaging was performed at rest 45 minutes following the intravenous administration of  Technetium 11m Sestamibi. Rest ECG: Atrial Fibrilliation  Stress Procedure:  The patient received IV Lexiscan 0.4 mg over 15-seconds.  Technetium 42m Sestamibi injected at 30-seconds.  Quantitative spect images were obtained after a 45 minute delay. Stress ECG: There are scattered PVCs. and slow ventricular response  QPS Raw Data Images:  Normal; no motion artifact; normal heart/lung ratio. Stress Images:  There is decreased uptake in the apex. Rest Images:  There is decreased uptake in the apex. Subtraction (SDS):  No evidence of ischemia. Transient Ischemic Dilatation (Normal <1.22):  1.03 Lung/Heart Ratio (Normal <0.45):  0.29  Quantitative Gated Spect Images QGS EDV:  NA QGS ESV:  NA  Impression Exercise Capacity:  Lexiscan with no exercise. BP Response:  Normal blood pressure response. Clinical Symptoms:  No significant symptoms noted. ECG Impression:  There are scattered PVCs. Comparison with Prior Nuclear Study: 2013 - mild apical thinning, non-gated  Overall Impression:  Low risk stress nuclear study with small fixed inferoapical defect, which could represent thinning. No ischemia.  LV Ejection Fraction: Study not gated.  LV Wall Motion:  NA  Pixie Casino, MD, [facility] Board Certified in Nuclear Cardiology Attending Cardiologist [facility]

## 2014-06-12 ENCOUNTER — Ambulatory Visit: Payer: Medicare Other

## 2014-06-15 ENCOUNTER — Ambulatory Visit (INDEPENDENT_AMBULATORY_CARE_PROVIDER_SITE_OTHER): Payer: Medicare Other | Admitting: General Practice

## 2014-06-15 DIAGNOSIS — Z5181 Encounter for therapeutic drug level monitoring: Secondary | ICD-10-CM | POA: Diagnosis not present

## 2014-06-15 DIAGNOSIS — I4891 Unspecified atrial fibrillation: Secondary | ICD-10-CM | POA: Diagnosis not present

## 2014-06-15 DIAGNOSIS — Z7901 Long term (current) use of anticoagulants: Secondary | ICD-10-CM

## 2014-06-15 DIAGNOSIS — Z8679 Personal history of other diseases of the circulatory system: Secondary | ICD-10-CM | POA: Diagnosis not present

## 2014-06-15 LAB — POCT INR: INR: 2.1

## 2014-06-15 NOTE — Progress Notes (Signed)
Pre visit review using our clinic review tool, if applicable. No additional management support is needed unless otherwise documented below in the visit note. 

## 2014-06-26 ENCOUNTER — Other Ambulatory Visit: Payer: Self-pay | Admitting: Cardiology

## 2014-06-29 DIAGNOSIS — D529 Folate deficiency anemia, unspecified: Secondary | ICD-10-CM | POA: Diagnosis not present

## 2014-06-29 DIAGNOSIS — M25549 Pain in joints of unspecified hand: Secondary | ICD-10-CM | POA: Diagnosis not present

## 2014-06-29 DIAGNOSIS — M069 Rheumatoid arthritis, unspecified: Secondary | ICD-10-CM | POA: Diagnosis not present

## 2014-06-29 DIAGNOSIS — Z79899 Other long term (current) drug therapy: Secondary | ICD-10-CM | POA: Diagnosis not present

## 2014-07-10 ENCOUNTER — Ambulatory Visit (INDEPENDENT_AMBULATORY_CARE_PROVIDER_SITE_OTHER): Payer: Medicare Other | Admitting: Cardiology

## 2014-07-10 VITALS — BP 118/66 | HR 66 | Ht 72.0 in | Wt 171.0 lb

## 2014-07-10 DIAGNOSIS — I2510 Atherosclerotic heart disease of native coronary artery without angina pectoris: Secondary | ICD-10-CM

## 2014-07-10 DIAGNOSIS — E785 Hyperlipidemia, unspecified: Secondary | ICD-10-CM

## 2014-07-10 DIAGNOSIS — R0609 Other forms of dyspnea: Secondary | ICD-10-CM | POA: Diagnosis not present

## 2014-07-10 DIAGNOSIS — I4891 Unspecified atrial fibrillation: Secondary | ICD-10-CM | POA: Diagnosis not present

## 2014-07-10 DIAGNOSIS — I252 Old myocardial infarction: Secondary | ICD-10-CM

## 2014-07-10 DIAGNOSIS — J449 Chronic obstructive pulmonary disease, unspecified: Secondary | ICD-10-CM

## 2014-07-10 DIAGNOSIS — I10 Essential (primary) hypertension: Secondary | ICD-10-CM

## 2014-07-10 DIAGNOSIS — I5033 Acute on chronic diastolic (congestive) heart failure: Secondary | ICD-10-CM | POA: Insufficient documentation

## 2014-07-10 DIAGNOSIS — I6529 Occlusion and stenosis of unspecified carotid artery: Secondary | ICD-10-CM

## 2014-07-10 DIAGNOSIS — R0989 Other specified symptoms and signs involving the circulatory and respiratory systems: Secondary | ICD-10-CM

## 2014-07-10 MED ORDER — FUROSEMIDE 40 MG PO TABS: 40.0000 mg | ORAL_TABLET | Freq: Every day | ORAL | Status: DC

## 2014-07-10 NOTE — Progress Notes (Signed)
Patient ID: [name] [name], male   DOB: 11-Feb-1925, 80 y.o.   MRN: [id number]    Patient Name: [name] [name] Date of Encounter: 07/10/2014  Primary Care Provider:  [name], MD Primary Cardiologist:  [name] [name] (formerly Dr. Verl Blalock)  Problem List   Past Medical History  Diagnosis Date  . Carotid artery occlusion     right total internal artery occlusion  . Coronary artery disease     s/p cardiac cath in 1990s, and cardiolite study  in 2005 showing EF 54%  . Hyperlipidemia   . Hypertension   . TIA (transient ischemic attack)   . Degenerative disc disease     cervical spine  . Arrhythmia   . Atrial fibrillation 03/2011  . Myocardial infarction 1992 and 1994  . Angina     "jaw & elbow was the worse"  . Pneumonia ~ 1935  . GERD (gastroesophageal reflux disease)   . Skin cancer     forehead  . Urgency incontinence   . Bilateral cellulitis of lower leg    Past Surgical History  Procedure Laterality Date  . Cataract extraction w/ intraocular lens  implant, bilateral    . Cardiac catheterization  1990s  . Skin cancer excision  12/2011    forehead    Allergies  Allergies  Allergen Reactions  . Levofloxacin Rash    "thought they were sticking swords thru me"  . Klor-Con [Potassium Chloride Er] Diarrhea    DIARRHEA    HPI  [name] [name] returns today for evaluation and management of his history of coronary artery disease, history of chronic A. fib, anticoagulation. He also has carotid artery disease which is followed by the vascular surgery group. His last sono carotid was unchanged from prior there is B/L < 40% stenosis. They have been stable. He denies any symptoms of TIAs.  He still very active doing all of his yard work. He was scraping some snow this morning. He denies any chest pain, shortness of breath, palpitations presyncope or syncope. He denies any bleeding or melena. He is overdue for blood work.  07/10/2014 - no complaints, no more chest pain, SOB  or palpitations. Stable LE edema.   Home Medications  Prior to Admission medications   Medication Sig Start Date End Date Taking? Authorizing Provider  aspirin 81 MG tablet Take 81 mg by mouth at bedtime.     Historical Provider, MD  atorvastatin (LIPITOR) 10 MG tablet TAKE 1 TABLET AT BEDTIME 10/03/13   [name] Spark, MD  Calcium-Vitamin D (CALTRATE 600 PLUS-VIT D PO) Take 1 tablet by mouth at bedtime.     Historical Provider, MD  digoxin (LANOXIN) 0.125 MG tablet Take 0.125 mg by mouth daily.    Historical Provider, MD  furosemide (LASIX) 20 MG tablet Take 20 mg by mouth daily.    Historical Provider, MD  levothyroxine (SYNTHROID, LEVOTHROID) 100 MCG tablet TAKE 1 TABLET DAILY 09/22/13   [name] Cookey, MD  methotrexate (RHEUMATREX) 2.5 MG tablet Take 5-7.5 mg by mouth 2 (two) times a week. Take 3 tablets on Saturday and 2 tablets on sunday    Historical Provider, MD  metoprolol succinate (TOPROL-XL) 50 MG 24 hr tablet Take 50 mg by mouth 2 (two) times daily. Take with or immediately following a meal.    Historical Provider, MD  metoprolol succinate (TOPROL-XL) 50 MG 24 hr tablet TAKE 1 TABLET TWICE A DAY 11/27/13   [name] Spark, MD  Multiple Vitamin (MULTIVITAMIN WITH MINERALS)  TABS tablet Take 1 tablet by mouth daily.    Historical Provider, MD  nitroGLYCERIN (NITROSTAT) 0.4 MG SL tablet Place 0.4 mg under the tongue every 5 (five) minutes as needed. For chest pain 02/24/11   [name] Cunas, MD  potassium chloride SA (K-DUR,KLOR-CON) 20 MEQ tablet Take 1 tablet (20 mEq total) by mouth daily. 07/05/13   [name] Post, MD  traMADol (ULTRAM) 50 MG tablet Take 50 mg by mouth every 8 (eight) hours as needed. For pain    Historical Provider, MD  warfarin (COUMADIN) 2.5 MG tablet Take as directed by anticoagulation clinic 11/10/13   [name] Cookey, MD    Family History  Family History  Problem Relation Age of Onset  . Heart disease Mother   . Malignant hyperthermia Mother   . Heart  disease Father   . Malignant hyperthermia Father     Social History  History   Social History  . Marital Status: Married    Spouse Name: N/A    Number of Children: N/A  . Years of Education: N/A   Occupational History  . Not on file.   Social History Main Topics  . Smoking status: Former Smoker -- 1.00 packs/day for 40 years    Types: Cigarettes  . Smokeless tobacco: Former Systems developer    Quit date: 11/26/1990     Comment: quit around 1992  . Alcohol Use: No     Comment: former, quit drinking in 1992  . Drug Use: No  . Sexual Activity: Not Currently   Other Topics Concern  . 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  Cardiovascular:  No chest pain, dyspnea on exertion, edema, orthopnea, palpitations, paroxysmal nocturnal dyspnea. Dermatological: No rash, lesions/masses Respiratory: No cough, dyspnea Urologic: No hematuria, dysuria Abdominal:   No nausea, vomiting, diarrhea, bright red blood per rectum, melena, or hematemesis Neurologic:  No visual changes, wkns, changes in mental status. All other systems reviewed and are otherwise negative except as noted above.  Physical Exam  Blood pressure 118/66, pulse 66, height 6' (1.829 m), weight 171 lb (77.565 kg).  General: Pleasant, NAD Psych: Normal affect. Neuro: Alert and oriented X 3. Moves all extremities spontaneously. HEENT: Normal  Neck: Supple without bruits or JVD. Lungs:  Resp regular and unlabored, CTA. Heart: RRR no s3, s4, or murmurs. Abdomen: Soft, non-tender, non-distended, BS + x 4.  Extremities: No clubbing, cyanosis, + 2 LE edema up to the knees DP/PT/Radials 2+ and equal bilaterally.  Labs:  No results found for this basename: CKTOTAL, CKMB,  TROPONINI,  in the last 72 hours Lab Results  Component Value Date   WBC 5.5 05/19/2014   HGB 10.2* 05/19/2014   HCT 31.9* 05/19/2014   MCV 94.4 05/19/2014   PLT 203 05/19/2014   No results found for this basename: NA, K, CL, CO2, BUN, CREATININE, CALCIUM, LABALBU, PROT, BILITOT, ALKPHOS, ALT, AST, GLUCOSE,  in the last 168 hours Lab Results  Component Value Date   CHOL 101 12/28/2013   HDL 32.70* 12/28/2013   LDLCALC 54 12/28/2013   [name] 71.0 12/28/2013   No results found for this basename: DDIMER   No components found with this  basename: POCBNP,   Accessory Clinical Findings  Echocardiogram 11/27/2011  - Left ventricle: The cavity size was normal. Systolic function was mildly reduced. The estimated ejection fraction was in the range of 45% to 50%. Wall motion was normal; there were no regional wall motion abnormalities. - Mitral valve: Mild regurgitation. - Left atrium: The atrium was mildly to moderately dilated. - Right ventricle: The cavity size was mildly dilated. Wall thickness was normal. Systolic function was mildly reduced. - Right atrium: The atrium was moderately dilated. - Pulmonary arteries: Systolic pressure was mildly to moderately increased.  ECG - atrial fibrillation, rate controlled 64 BPM, nonspecific ST abnormality  Lexiscan nuclear stress test 05/31/2014 Impression  Exercise Capacity: Lexiscan with no exercise.  BP Response: Normal blood pressure response.  Clinical Symptoms: No significant symptoms noted.  ECG Impression: There are scattered PVCs.  Comparison with Prior Nuclear Study: 2013 - mild apical thinning, non-gated  Overall Impression: Low risk stress nuclear study with small fixed inferoapical defect, which could represent thinning. No ischemia.    Assessment & Plan  1. CAD - stable, continue ASA, lipitor, metoprolol XL, negative stress test on 05/31/14, no more chest pain, stays active  2. Chronic systolic CHF - LVEF 10-93%, on lasix 20 mg po daily,  the patient has stable pitting LE edema, we will increase Lasix to 40 mg po daily and prescribe compression stockings, the last lab K 3.3, we will recheck today  3. Carotid disease - US carotid in 05/2013 less than 40% stenosis bilaterally   4. Chronic persistent A-fib - rate controlled, INR therapeutic, no falls  5. Lipids - we will check  CMP, TSH today, follow up in 3 months with labs   [name] Spark, MD, [facility] 07/10/2014, 4:09 PM

## 2014-07-10 NOTE — Patient Instructions (Addendum)
Your physician has recommended you make the following change in your medication:   INCREASE YOUR LASIX TO 40 MG   YOUR MD WANTS YOU TO INCREASE YOUR POTASSIUM IN YOUR DIET DAILY  Your physician recommends that you return for lab work in: TODAY (TSH, CMET, BNP)  YOUR MD HAS PRESCRIBED YOU COMPRESSION STOCKINGS TO [place]  Your physician recommends that you schedule a follow-up appointment in: [place]

## 2014-07-11 LAB — COMPREHENSIVE METABOLIC PANEL
ALT: 17 U/L (ref 0–53)
AST: 35 U/L (ref 0–37)
Albumin: 3.2 g/dL — ABNORMAL LOW (ref 3.5–5.2)
Alkaline Phosphatase: 65 U/L (ref 39–117)
BUN: 5 mg/dL — ABNORMAL LOW (ref 6–23)
CO2: 29 mEq/L (ref 19–32)
Calcium: 8.8 mg/dL (ref 8.4–10.5)
Chloride: 100 mEq/L (ref 96–112)
Creatinine, Ser: 0.9 mg/dL (ref 0.4–1.5)
GFR: 87.07 mL/min (ref >60.00)
Glucose, Bld: 63 mg/dL — ABNORMAL LOW (ref 70–99)
Potassium: 6.8 mEq/L (ref 3.5–5.1)
Sodium: 138 mEq/L (ref 135–145)
Total Bilirubin: 1.1 mg/dL (ref 0.2–1.2)
Total Protein: 5.6 g/dL — ABNORMAL LOW (ref 6.0–8.3)

## 2014-07-11 LAB — TSH: TSH: 1.31 u[IU]/mL (ref 0.35–4.50)

## 2014-07-11 LAB — BRAIN NATRIURETIC PEPTIDE: Pro B Natriuretic peptide (BNP): 455 pg/mL — ABNORMAL HIGH (ref 0.0–100.0)

## 2014-07-12 ENCOUNTER — Telehealth: Payer: Self-pay | Admitting: *Deleted

## 2014-07-12 DIAGNOSIS — E875 Hyperkalemia: Secondary | ICD-10-CM

## 2014-07-12 DIAGNOSIS — I252 Old myocardial infarction: Secondary | ICD-10-CM

## 2014-07-12 DIAGNOSIS — E785 Hyperlipidemia, unspecified: Secondary | ICD-10-CM

## 2014-07-12 DIAGNOSIS — I10 Essential (primary) hypertension: Secondary | ICD-10-CM

## 2014-07-12 DIAGNOSIS — I2510 Atherosclerotic heart disease of native coronary artery without angina pectoris: Secondary | ICD-10-CM

## 2014-07-12 NOTE — Telephone Encounter (Signed)
Informed pt that per Dr Meda Coffee his labs came back and showed his potassium was critically high at 6.8.  Informed the pt that per Dr Meda Coffee he needs to continue taking his recently increased Lasix and avoid foods high in potassium and come in for repeat lab work next week.  Made pt lab appt for 8/26 to recheck CMET. Provided pt education on foods that are high in K. Pt verbalized understanding and agrees with this plan.

## 2014-07-13 ENCOUNTER — Encounter: Payer: Self-pay | Admitting: Podiatry

## 2014-07-13 ENCOUNTER — Ambulatory Visit (INDEPENDENT_AMBULATORY_CARE_PROVIDER_SITE_OTHER): Payer: Medicare Other | Admitting: Podiatry

## 2014-07-13 ENCOUNTER — Ambulatory Visit: Payer: Medicare Other

## 2014-07-13 VITALS — BP 135/70 | HR 62 | Resp 16 | Ht 72.0 in | Wt 171.0 lb

## 2014-07-13 DIAGNOSIS — L84 Corns and callosities: Secondary | ICD-10-CM | POA: Diagnosis not present

## 2014-07-13 DIAGNOSIS — M79609 Pain in unspecified limb: Secondary | ICD-10-CM

## 2014-07-13 DIAGNOSIS — B351 Tinea unguium: Secondary | ICD-10-CM

## 2014-07-13 DIAGNOSIS — I2510 Atherosclerotic heart disease of native coronary artery without angina pectoris: Secondary | ICD-10-CM

## 2014-07-13 DIAGNOSIS — M79673 Pain in unspecified foot: Secondary | ICD-10-CM

## 2014-07-13 NOTE — Progress Notes (Signed)
Subjective:     Patient ID: [name] [name], male   DOB: 06/20/1925, 80 y.o.   MRN: [id number]  Toe Pain    poor health 80 year old individual presents with nail disease and lesions on the left foot that are painful stating he cannot cut his toenails and they become painful in shoe   Review of Systems  All other systems reviewed and are negative.      Objective:   Physical Exam  Nursing note and vitals reviewed. Constitutional: He is oriented to person, place, and time.  Cardiovascular: Intact distal pulses.   Neurological: He is oriented to person, place, and time.  Skin: Skin is warm and dry.   neurovascular status found to be diminished but intact with edema in the forefoot of both feet mild varicosities dry skin and keratotic lesion fifth digit left sub-fifth and third metatarsals left along with thick yellow brittle nailbeds 1-5 of both feet. Digits are well-perfused with diminished hair growth noted and diminished arch height and patient is well oriented x3    Assessment:     Significant vascular issues with keratotic lesion formation and nail disease 1-5 both feet that are painful    Plan:     H&P performed and condition discussed explained. At this point I went ahead and I debrided nailbeds 1-5 both feet and debridement lesions on the left foot with no iatrogenic bleeding noted. This will be done periodically and will be seen back as needed

## 2014-07-13 NOTE — Progress Notes (Signed)
   Subjective:    Patient ID: [name] [name], male    DOB: 08-01-1925, 80 y.o.   MRN: [id number]  HPI Comments: "I have a [name] that hurts"  Patient c/o aching 5th toe left for several months. The area is callused lateral corner of nail and he is walking on it. He has several other calluses plantar forefoot bilateral, left over right. He also would like his toenails cut.  Toe Pain       Review of Systems  Cardiovascular: Positive for leg swelling.  Musculoskeletal: Positive for gait problem.  All other systems reviewed and are negative.      Objective:   Physical Exam        Assessment & Plan:

## 2014-07-14 ENCOUNTER — Ambulatory Visit (INDEPENDENT_AMBULATORY_CARE_PROVIDER_SITE_OTHER): Payer: Medicare Other | Admitting: Family

## 2014-07-14 DIAGNOSIS — Z5181 Encounter for therapeutic drug level monitoring: Secondary | ICD-10-CM | POA: Diagnosis not present

## 2014-07-14 DIAGNOSIS — Z8679 Personal history of other diseases of the circulatory system: Secondary | ICD-10-CM | POA: Diagnosis not present

## 2014-07-14 LAB — POCT INR: INR: 2.6

## 2014-07-14 NOTE — Patient Instructions (Signed)
Continue to take 1 tablet daily (5 mg).  Re-check in 4 weeks.   Anticoagulation Dose Instructions as of 07/14/2014     [name] [name] Tue Wed Thu Fri Sat   New Dose 5 mg 5 mg 5 mg 5 mg 5 mg 5 mg 5 mg    Description       Continue to take 1 tablet daily (5 mg).  Re-check in 4 weeks.

## 2014-07-19 ENCOUNTER — Other Ambulatory Visit (INDEPENDENT_AMBULATORY_CARE_PROVIDER_SITE_OTHER): Payer: Medicare Other

## 2014-07-19 DIAGNOSIS — I2510 Atherosclerotic heart disease of native coronary artery without angina pectoris: Secondary | ICD-10-CM | POA: Diagnosis not present

## 2014-07-19 DIAGNOSIS — E785 Hyperlipidemia, unspecified: Secondary | ICD-10-CM

## 2014-07-19 DIAGNOSIS — I10 Essential (primary) hypertension: Secondary | ICD-10-CM | POA: Diagnosis not present

## 2014-07-19 DIAGNOSIS — I252 Old myocardial infarction: Secondary | ICD-10-CM

## 2014-07-19 DIAGNOSIS — E875 Hyperkalemia: Secondary | ICD-10-CM

## 2014-07-19 LAB — COMPREHENSIVE METABOLIC PANEL
ALT: 17 U/L (ref 0–53)
AST: 30 U/L (ref 0–37)
Albumin: 3.1 g/dL — ABNORMAL LOW (ref 3.5–5.2)
Alkaline Phosphatase: 71 U/L (ref 39–117)
BUN: 6 mg/dL (ref 6–23)
CO2: 30 mEq/L (ref 19–32)
Calcium: 8.5 mg/dL (ref 8.4–10.5)
Chloride: 100 mEq/L (ref 96–112)
Creatinine, Ser: 0.9 mg/dL (ref 0.4–1.5)
GFR: 90.62 mL/min (ref >60.00)
Glucose, Bld: 80 mg/dL (ref 70–99)
Potassium: 3.2 mEq/L — ABNORMAL LOW (ref 3.5–5.1)
Sodium: 137 mEq/L (ref 135–145)
Total Bilirubin: 0.7 mg/dL (ref 0.2–1.2)
Total Protein: 5.8 g/dL — ABNORMAL LOW (ref 6.0–8.3)

## 2014-07-21 ENCOUNTER — Telehealth: Payer: Self-pay | Admitting: *Deleted

## 2014-07-21 NOTE — Telephone Encounter (Signed)
Message copied by Nuala Alpha on Fri Jul 21, 2014 12:09 PM ------      Message from: Dorothy Spark      Created: Fri Jul 21, 2014 11:45 AM       He needs to increase his KCL to 40 mEq daily (from 20 mEq)      Thank you ------

## 2014-07-21 NOTE — Telephone Encounter (Signed)
Notified pt of recent lab work.  Per Dr Meda Coffee his K is slightly low and should start taking KCL 40 mEq daily- from 20.  Pt states he does not want to take K meds at all, and has not been taking any, because it causes him "extreme diarrhea." Informed pt that we respect his decision but he should increase the K in his diet.  Informed pt that I will mail him resources on foods high in K.  Pt verbalized understanding and agrees with this plan.  Dr Meda Coffee is aware.

## 2014-08-02 DIAGNOSIS — Z79899 Other long term (current) drug therapy: Secondary | ICD-10-CM | POA: Diagnosis not present

## 2014-08-02 DIAGNOSIS — M069 Rheumatoid arthritis, unspecified: Secondary | ICD-10-CM | POA: Diagnosis not present

## 2014-08-11 ENCOUNTER — Ambulatory Visit: Payer: Medicare Other | Admitting: Family

## 2014-08-14 ENCOUNTER — Ambulatory Visit (INDEPENDENT_AMBULATORY_CARE_PROVIDER_SITE_OTHER): Payer: Medicare Other | Admitting: Family

## 2014-08-14 DIAGNOSIS — I482 Chronic atrial fibrillation, unspecified: Secondary | ICD-10-CM

## 2014-08-14 DIAGNOSIS — I4891 Unspecified atrial fibrillation: Secondary | ICD-10-CM

## 2014-08-14 DIAGNOSIS — Z5181 Encounter for therapeutic drug level monitoring: Secondary | ICD-10-CM | POA: Diagnosis not present

## 2014-08-14 DIAGNOSIS — Z8679 Personal history of other diseases of the circulatory system: Secondary | ICD-10-CM | POA: Diagnosis not present

## 2014-08-14 LAB — POCT INR
INR: 2.5
INR: 2.5
INR: 2.9

## 2014-08-14 NOTE — Patient Instructions (Signed)
Continue to take 1 tablet daily (5 mg).  Re-check in 4 weeks.  Anticoagulation Dose Instructions as of 08/14/2014     Dorene Grebe Tue Wed Thu Fri Sat   New Dose 5 mg 5 mg 5 mg 5 mg 5 mg 5 mg 5 mg    Description       Continue to take 1 tablet daily (5 mg).  Re-check in 4 weeks.

## 2014-08-23 ENCOUNTER — Other Ambulatory Visit: Payer: Self-pay | Admitting: Cardiology

## 2014-09-03 ENCOUNTER — Other Ambulatory Visit: Payer: Self-pay | Admitting: Family Medicine

## 2014-09-06 DIAGNOSIS — Z7952 Long term (current) use of systemic steroids: Secondary | ICD-10-CM | POA: Diagnosis not present

## 2014-09-06 DIAGNOSIS — M069 Rheumatoid arthritis, unspecified: Secondary | ICD-10-CM | POA: Diagnosis not present

## 2014-09-08 DIAGNOSIS — Z23 Encounter for immunization: Secondary | ICD-10-CM | POA: Diagnosis not present

## 2014-09-11 ENCOUNTER — Ambulatory Visit: Payer: Medicare Other

## 2014-09-11 ENCOUNTER — Encounter: Payer: Self-pay | Admitting: Vascular Surgery

## 2014-09-12 ENCOUNTER — Encounter: Payer: Self-pay | Admitting: Vascular Surgery

## 2014-09-12 ENCOUNTER — Ambulatory Visit (HOSPITAL_COMMUNITY)
Admission: RE | Admit: 2014-09-12 | Discharge: 2014-09-12 | Disposition: A | Discharge status: Home / Self Care | Payer: Medicare Other | Source: Ambulatory Visit | Attending: Vascular Surgery | Admitting: Vascular Surgery

## 2014-09-12 ENCOUNTER — Ambulatory Visit (INDEPENDENT_AMBULATORY_CARE_PROVIDER_SITE_OTHER): Payer: Medicare Other | Admitting: Vascular Surgery

## 2014-09-12 VITALS — BP 168/77 | HR 65 | Resp 16 | Ht 72.0 in | Wt 171.0 lb

## 2014-09-12 DIAGNOSIS — I779 Disorder of arteries and arterioles, unspecified: Secondary | ICD-10-CM

## 2014-09-12 DIAGNOSIS — I2510 Atherosclerotic heart disease of native coronary artery without angina pectoris: Secondary | ICD-10-CM | POA: Diagnosis not present

## 2014-09-12 DIAGNOSIS — I6523 Occlusion and stenosis of bilateral carotid arteries: Secondary | ICD-10-CM | POA: Insufficient documentation

## 2014-09-12 DIAGNOSIS — I739 Peripheral vascular disease, unspecified: Principal | ICD-10-CM

## 2014-09-12 NOTE — Progress Notes (Signed)
Subjective:     Patient ID: [name] [name], male   DOB: 04-17-1925, 80 y.o.   MRN: [id number]  HPI this 80 year old male is seen today for followup regarding his carotid occlusive disease. I have not seen him since 2011. He has had carotid ultrasounds in the interim however. He has a known right ICA occlusion and mild to moderate left ICA stenosis. He denies any neurologic symptoms such as lateralizing weakness, aphasia, amaurosis fugax, diplopia, blurred vision, and syncope. He takes Coumadin on a daily basis for A. fib he states he has been feeling well over the past year.  Past Medical History  Diagnosis Date  . Carotid artery occlusion     right total internal artery occlusion  . Coronary artery disease     s/p cardiac cath in 1990s, and cardiolite study  in 2005 showing EF 54%  . Hyperlipidemia   . Hypertension   . TIA (transient ischemic attack)   . Degenerative disc disease     cervical spine  . Arrhythmia   . Atrial fibrillation 03/2011  . Myocardial infarction 1992 and 1994  . Angina     "jaw & elbow was the worse"  . Pneumonia ~ 1935  . GERD (gastroesophageal reflux disease)   . Skin cancer     forehead  . Urgency incontinence   . Bilateral cellulitis of lower leg     History  Substance Use Topics  . Smoking status: Former Smoker -- 1.00 packs/day for 40 years    Types: Cigarettes    Quit date: 09/12/1989  . Smokeless tobacco: Former Systems developer    Quit date: 11/26/1990     Comment: quit around 1992  . Alcohol Use: No     Comment: former, quit drinking in 1992    Family History  Problem Relation Age of Onset  . Heart disease Mother   . Malignant hyperthermia Mother   . Hypertension Mother   . Heart disease Father   . Malignant hyperthermia Father   . Hyperlipidemia Father   . Hypertension Father   . Heart disease Sister   . Hypertension Sister   . Heart disease Brother     Allergies  Allergen Reactions  . Levofloxacin Rash    "thought they were sticking  swords thru me"  . Klor-Con [Potassium Chloride Er] Diarrhea    DIARRHEA    Current outpatient prescriptions:aspirin 81 MG tablet, Take 81 mg by mouth at bedtime. , Disp: , Rfl: ;  atorvastatin (LIPITOR) 10 MG tablet, TAKE 1 TABLET AT BEDTIME, Disp: 90 tablet, Rfl: 1;  Calcium-Vitamin D (CALTRATE 600 PLUS-VIT D PO), Take 1 tablet by mouth at bedtime. , Disp: , Rfl: ;  digoxin (LANOXIN) 0.125 MG tablet, Take 0.125 mg by mouth daily., Disp: , Rfl:  furosemide (LASIX) 40 MG tablet, Take 1 tablet (40 mg total) by mouth daily., Disp: 90 tablet, Rfl: 3;  LANOXIN 125 MCG tablet, TAKE 1 TABLET DAILY, Disp: 90 tablet, Rfl: 0;  levothyroxine (SYNTHROID, LEVOTHROID) 100 MCG tablet, TAKE 1 TABLET DAILY, Disp: 90 tablet, Rfl: 2;  methotrexate (RHEUMATREX) 2.5 MG tablet, Take 5-7.5 mg by mouth 2 (two) times a week. Take 3 tablets on Saturday and 2 tablets on sunday, Disp: , Rfl:  metoprolol succinate (TOPROL-XL) 50 MG 24 hr tablet, Take 50 mg by mouth 2 (two) times daily. Take with or immediately following a meal., Disp: , Rfl: ;  Multiple Vitamin (MULTIVITAMIN WITH MINERALS) TABS tablet, Take 1 tablet by mouth daily., Disp: , Rfl: ;  nitroGLYCERIN (NITROSTAT) 0.4 MG SL tablet, Place 0.4 mg under the tongue every 5 (five) minutes as needed. For chest pain, Disp: , Rfl:  nitroGLYCERIN (NITROSTAT) 0.4 MG SL tablet, Place 1 tablet (0.4 mg total) under the tongue every 5 (five) minutes as needed for chest pain., Disp: 30 tablet, Rfl: 0;  traMADol (ULTRAM) 50 MG tablet, Take 50 mg by mouth every 8 (eight) hours as needed. For pain, Disp: , Rfl: ;  warfarin (COUMADIN) 5 MG tablet, Take as directed by anticoagulation clinic., Disp: 120 tablet, Rfl: 1 warfarin (COUMADIN) 5 MG tablet, Take as directed. (please fill 10 tablets until patient gets re-fill from [facility]), Disp: 10 tablet, Rfl: 0;  zoster vaccine live, PF, (ZOSTAVAX) 65681 UNT/0.65ML injection, Inject 19,400 Units into the skin once., Disp: 1 each, Rfl: 0;   HYDROcodone-homatropine (HYCODAN) 5-1.5 MG/5ML syrup, 1/2 - 1 teaspoon at bedtime as needed for cough, Disp: 120 mL, Rfl: 0 [DISCONTINUED] diphenhydrAMINE (BENADRYL) 25 MG tablet, Take 25 mg by mouth every 6 (six) hours as needed. For itching, Disp: , Rfl: ;  [DISCONTINUED] methotrexate (RHEUMATREX) 2.5 MG tablet, Take 10 mg by mouth once a week. Sunday, Disp: , Rfl:   BP 168/77  Pulse 65  Resp 16  Ht 6' (1.829 m)  Wt 171 lb (77.565 kg)  BMI 23.19 kg/m2  Body mass index is 23.19 kg/(m^2).           Review of Systems has history of myocardial infarction but denies any active chest pain, dyspnea on exertion, PND, orthopnea, hemoptysis. Does have chronic atrial fibrillation on Coumadin therapy. Has chronic edema both lower extremities with thickening of skin but no active ulcerations. Other systems negative the complete review of systems    Objective:   Physical Exam BP 168/77  Pulse 65  Resp 16  Ht 6' (1.829 m)  Wt 171 lb (77.565 kg)  BMI 23.19 kg/m2  Gen.-alert and oriented x3 in no apparent distress-elderly HEENT normal for age Lungs no rhonchi or wheezing Cardiovascular irregular rhythm systolic ejection murmur 2/4 carotid pulses 3+ palpable no bruits audible Abdomen soft nontender no palpable masses Musculoskeletal free of  major deformities Skin clear -no rashes Neurologic normal Lower extremities 3+ femoral and dorsalis pedis pulses palpable bilaterally with 1+ edema bilaterally with hyperpigmentation and thickening of skin but no active ulceration  Today I ordered a carotid duplex exam which I reviewed and interpreted. Right ICA is occluded-chronically. Left ICA has an approximate 40% stenosis.      Assessment:     Occlusion right ICA with mild left ICA stenosis-40%-asymptomatic Chronic A. fib on Coumadin    Plan:     Return in one year for carotid duplex exam unless patient develops symptoms in the interim

## 2014-09-13 NOTE — Addendum Note (Signed)
Addended by: Mena Goes on: 09/13/2014 09:20 AM   Modules accepted: Orders

## 2014-09-18 ENCOUNTER — Ambulatory Visit (INDEPENDENT_AMBULATORY_CARE_PROVIDER_SITE_OTHER): Payer: Medicare Other | Admitting: Family

## 2014-09-18 DIAGNOSIS — I4891 Unspecified atrial fibrillation: Secondary | ICD-10-CM | POA: Diagnosis not present

## 2014-09-18 DIAGNOSIS — Z5181 Encounter for therapeutic drug level monitoring: Secondary | ICD-10-CM

## 2014-09-18 LAB — POCT INR: INR: 2

## 2014-09-18 NOTE — Patient Instructions (Signed)
Continue to take 1 tablet daily (5 mg).  Re-check in 6 weeks.  Anticoagulation Dose Instructions as of 09/18/2014     Dorene Grebe Tue Wed Thu Fri Sat   New Dose 5 mg 5 mg 5 mg 5 mg 5 mg 5 mg 5 mg    Description       Continue to take 1 tablet daily (5 mg).  Re-check in 6 weeks.

## 2014-09-29 ENCOUNTER — Other Ambulatory Visit: Payer: Self-pay | Admitting: Cardiology

## 2014-10-12 ENCOUNTER — Encounter: Payer: Self-pay | Admitting: Cardiology

## 2014-10-12 ENCOUNTER — Ambulatory Visit (INDEPENDENT_AMBULATORY_CARE_PROVIDER_SITE_OTHER): Payer: Medicare Other | Admitting: Cardiology

## 2014-10-12 VITALS — BP 128/58 | HR 59 | Ht 72.0 in | Wt 170.0 lb

## 2014-10-12 DIAGNOSIS — I5022 Chronic systolic (congestive) heart failure: Secondary | ICD-10-CM

## 2014-10-12 DIAGNOSIS — I482 Chronic atrial fibrillation, unspecified: Secondary | ICD-10-CM

## 2014-10-12 DIAGNOSIS — I10 Essential (primary) hypertension: Secondary | ICD-10-CM | POA: Diagnosis not present

## 2014-10-12 DIAGNOSIS — I2510 Atherosclerotic heart disease of native coronary artery without angina pectoris: Secondary | ICD-10-CM | POA: Diagnosis not present

## 2014-10-12 NOTE — Patient Instructions (Signed)
Your physician recommends that you continue on your current medications as directed. Please refer to the Current Medication list given to you today.    Your physician recommends that you return for lab work in: TODAY ---BMET      Your physician wants you to follow-up in: 6 MONTHS WITH DR NELSON You will receive a reminder letter in the mail two months in advance. If you don't receive a letter, please call our office to schedule the follow-up appointment.    

## 2014-10-12 NOTE — Progress Notes (Signed)
Patient ID: [name] [name], male   DOB: June 12, 1925, 80 y.o.   MRN: [id number]    Patient Name: [name] [name] Date of Encounter: 10/12/2014  Primary Care Provider:  [name], MD Primary Cardiologist:  Dorothy Spark (formerly Dr. Verl Blalock)  Problem List   Past Medical History  Diagnosis Date  . Carotid artery occlusion     right total internal artery occlusion  . Coronary artery disease     s/p cardiac cath in 1990s, and cardiolite study  in 2005 showing EF 54%  . Hyperlipidemia   . Hypertension   . TIA (transient ischemic attack)   . Degenerative disc disease     cervical spine  . Arrhythmia   . Atrial fibrillation 03/2011  . Myocardial infarction 1992 and 1994  . Angina     "jaw & elbow was the worse"  . Pneumonia ~ 1935  . GERD (gastroesophageal reflux disease)   . Skin cancer     forehead  . Urgency incontinence   . Bilateral cellulitis of lower leg    Past Surgical History  Procedure Laterality Date  . Cataract extraction w/ intraocular lens  implant, bilateral    . Cardiac catheterization  1990s  . Skin cancer excision  12/2011    forehead    Allergies  Allergies  Allergen Reactions  . Levofloxacin Rash    "thought they were sticking swords thru me"  . Klor-Con [Potassium Chloride Er] Diarrhea    DIARRHEA    HPI  [name] [name] returns today for evaluation and management of his history of coronary artery disease, history of chronic A. fib, anticoagulation. He also has carotid artery disease which is followed by the vascular surgery group.  He denies any symptoms of TIAs. His last sono carotid on 09/12/14 was unchanged from prior, with chronic right ICA occlusion and  < 40% stenosis in left ICA. He still very active doing all of his yard work. He was scraping some snow this morning. He denies any chest pain, shortness of breath, palpitations presyncope or syncope. He denies any bleeding or melena. He is overdue for blood work.  07/10/2014 - no  complaints, no more chest pain, SOB or palpitations. Stable LE edema.   10/12/2014 - the patient is coming for 3 months follow-up, he continues to have same all over extremity edema. He is unable to prolonged compression stockings. But he is veering on a regular stocking they're giving him significant compression. Patient states that he spends most of his stay on his feet as he is working around the house and he is taking care of his wife with Alzheimer. He is able to lay flat at night and denies any angina, dyspnea on exertion, orthopnea or paroxysmal nocturnal dyspnea.  Home Medications  Prior to Admission medications   Medication Sig Start Date End Date Taking? Authorizing Provider  aspirin 81 MG tablet Take 81 mg by mouth at bedtime.     Historical Provider, MD  atorvastatin (LIPITOR) 10 MG tablet TAKE 1 TABLET AT BEDTIME 10/03/13   Dorothy Spark, MD  Calcium-Vitamin D (CALTRATE 600 PLUS-VIT D PO) Take 1 tablet by mouth at bedtime.     Historical Provider, MD  digoxin (LANOXIN) 0.125 MG tablet Take 0.125 mg by mouth daily.    Historical Provider, MD  furosemide (LASIX) 20 MG tablet Take 20 mg by mouth daily.    Historical Provider, MD  levothyroxine (SYNTHROID, LEVOTHROID) 100 MCG tablet TAKE 1 TABLET DAILY 09/22/13   Dellis Filbert  Delora Fuel, MD  methotrexate (RHEUMATREX) 2.5 MG tablet Take 5-7.5 mg by mouth 2 (two) times a week. Take 3 tablets on Saturday and 2 tablets on sunday    Historical Provider, MD  metoprolol succinate (TOPROL-XL) 50 MG 24 hr tablet Take 50 mg by mouth 2 (two) times daily. Take with or immediately following a meal.    Historical Provider, MD  metoprolol succinate (TOPROL-XL) 50 MG 24 hr tablet TAKE 1 TABLET TWICE A DAY 11/27/13   Dorothy Spark, MD  Multiple Vitamin (MULTIVITAMIN WITH MINERALS) TABS tablet Take 1 tablet by mouth daily.    Historical Provider, MD  nitroGLYCERIN (NITROSTAT) 0.4 MG SL tablet Place 0.4 mg under the tongue every 5 (five) minutes as needed. For  chest pain 02/24/11   Renella Cunas, MD  potassium chloride SA (K-DUR,KLOR-CON) 20 MEQ tablet Take 1 tablet (20 mEq total) by mouth daily. 07/05/13   Eulas Post, MD  traMADol (ULTRAM) 50 MG tablet Take 50 mg by mouth every 8 (eight) hours as needed. For pain    Historical Provider, MD  warfarin (COUMADIN) 2.5 MG tablet Take as directed by anticoagulation clinic 11/10/13   Dorena Cookey, MD    Family History  Family History  Problem Relation Age of Onset  . Heart disease Mother   . Malignant hyperthermia Mother   . Hypertension Mother   . Heart disease Father   . Malignant hyperthermia Father   . Hyperlipidemia Father   . Hypertension Father   . Heart disease Sister   . Hypertension Sister   . Heart disease Brother     Social History  History   Social History  . Marital Status: Married    Spouse Name: N/A    Number of Children: N/A  . Years of Education: N/A   Occupational History  . Not on file.   Social History Main Topics  . Smoking status: Former Smoker -- 1.00 packs/day for 40 years    Types: Cigarettes    Quit date: 09/12/1989  . Smokeless tobacco: Former Systems developer    Quit date: 11/26/1990     Comment: quit around 1992  . Alcohol Use: No     Comment: former, quit drinking in 1992  . Drug Use: No  . Sexual Activity: Not Currently   Other Topics Concern  . 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  Cardiovascular:  No chest pain, dyspnea on exertion, edema, orthopnea, palpitations, paroxysmal nocturnal dyspnea. Dermatological: No rash, lesions/masses Respiratory: No cough, dyspnea Urologic: No hematuria, dysuria Abdominal:   No nausea, vomiting, diarrhea, bright red blood per rectum, melena, or  hematemesis Neurologic:  No visual changes, wkns, changes in mental status. All other systems reviewed and are otherwise negative except as noted above.  Physical Exam  Blood pressure 128/58, pulse 59, height 6' (1.829 m), weight 170 lb (77.111 kg), SpO2 95 %.  General: Pleasant, NAD Psych: Normal affect. Neuro: Alert and oriented X 3. Moves all extremities spontaneously. HEENT: Normal  Neck: Supple without bruits or JVD. Lungs:  Resp regular and unlabored, CTA. Heart: RRR no s3, s4, or murmurs. Abdomen: Soft, non-tender, non-distended, BS + x 4.  Extremities:  No clubbing, cyanosis, + 2 LE edema up to the knees DP/PT/Radials 2+ and equal bilaterally.  Labs:  No results for input(s): CKTOTAL, CKMB, TROPONINI in the last 72 hours. Lab Results  Component Value Date   WBC 5.5 05/19/2014   HGB 10.2* 05/19/2014   HCT 31.9* 05/19/2014   MCV 94.4 05/19/2014   PLT 203 05/19/2014   No results for input(s): NA, K, CL, CO2, BUN, CREATININE, CALCIUM, PROT, BILITOT, ALKPHOS, ALT, AST, GLUCOSE in the last 168 hours.  Invalid input(s): LABALBU Lab Results  Component Value Date   CHOL 101 12/28/2013   HDL 32.70* 12/28/2013   LDLCALC 54 12/28/2013   TRIG 71.0 12/28/2013   No results found for: DDIMER Invalid input(s): POCBNP  Accessory Clinical Findings  Echocardiogram 11/27/2011  - Left ventricle: The cavity size was normal. Systolic function was mildly reduced. The estimated ejection fraction was in the range of 45% to 50%. Wall motion was normal; there were no regional wall motion abnormalities. - Mitral valve: Mild regurgitation. - Left atrium: The atrium was mildly to moderately dilated. - Right ventricle: The cavity size was mildly dilated. Wall thickness was normal. Systolic function was mildly reduced. - Right atrium: The atrium was moderately dilated. - Pulmonary arteries: Systolic pressure was mildly to moderately increased.  ECG - atrial fibrillation, rate controlled  64 BPM, nonspecific ST abnormality  Lexiscan nuclear stress test 05/31/2014 Impression  Exercise Capacity: Lexiscan with no exercise.  BP Response: Normal blood pressure response.  Clinical Symptoms: No significant symptoms noted.  ECG Impression: There are scattered PVCs.  Comparison with Prior Nuclear Study: 2013 - mild apical thinning, non-gated  Overall Impression: Low risk stress nuclear study with small fixed inferoapical defect, which could represent thinning. No ischemia.    Assessment & Plan  1. CAD - stable, continue ASA, lipitor, metoprolol XL, negative stress test on 05/31/14, no more chest pain, stays active  2. Chronic systolic CHF - LVEF 17-61%, on lasix 20 mg po daily, the patient has stable pitting LE edema, no improvement with increased Lasix to 40 mg po daily, patient doesn't wish to have more increase of dose of Lasix as it causes him significant troubles at night going to the bathroom. For now we will just advise to elevate his legs during the day and night as much as he can. We will recheck the basic metabolic profile today.  3. Carotid disease - His last sono carotid on 09/12/14 was unchanged from prior, with chronic right ICA occlusion and  < 40% stenosis in left ICA.  4. Chronic persistent A-fib - rate controlled, INR therapeutic, no falls  5. Lipids - we will check  Follow-up in 6 months.   Dorothy Spark, MD, [facility] 10/12/2014, 3:23 PM

## 2014-10-13 ENCOUNTER — Telehealth: Payer: Self-pay | Admitting: *Deleted

## 2014-10-13 LAB — BASIC METABOLIC PANEL
BUN: 9 mg/dL (ref 6–23)
CO2: 34 mEq/L — ABNORMAL HIGH (ref 19–32)
Calcium: 8.9 mg/dL (ref 8.4–10.5)
Chloride: 101 mEq/L (ref 96–112)
Creatinine, Ser: 0.9 mg/dL (ref 0.4–1.5)
GFR: 83.71 mL/min (ref >60.00)
Glucose, Bld: 95 mg/dL (ref 70–99)
Potassium: 3.1 mEq/L — ABNORMAL LOW (ref 3.5–5.1)
Sodium: 142 mEq/L (ref 135–145)

## 2014-10-13 MED ORDER — POTASSIUM CHLORIDE CRYS ER 20 MEQ PO TBCR: 20.0000 meq | EXTENDED_RELEASE_TABLET | Freq: Every day | ORAL | Status: DC

## 2014-10-13 NOTE — Telephone Encounter (Signed)
-----   Message from Dorothy Spark, MD sent at 10/13/2014  8:25 AM EST ----- The patient is already taking 20 mEq of potassium chloride daily, please encourage him to eat more food rich in potassium.

## 2014-10-13 NOTE — Telephone Encounter (Signed)
Informed the pt of his lab results and Dr Francesca Oman recommendation for the pt to start taking his K chloride 20 mEq po daily.  Pt states he has not been taking his K but will start being compliant with taking this med.  Pt requesting having the K sent [facility].  Informed the pt while he's waiting on [facility] to deliver med, he should increase the K in his diet, and provided different food choices for the pt to help aide with this.  Pt verbalized understanding and agrees with this plan.

## 2014-10-23 ENCOUNTER — Ambulatory Visit (INDEPENDENT_AMBULATORY_CARE_PROVIDER_SITE_OTHER): Payer: Medicare Other | Admitting: Podiatry

## 2014-10-23 DIAGNOSIS — M79673 Pain in unspecified foot: Secondary | ICD-10-CM

## 2014-10-23 DIAGNOSIS — B351 Tinea unguium: Secondary | ICD-10-CM | POA: Diagnosis not present

## 2014-10-23 DIAGNOSIS — L84 Corns and callosities: Secondary | ICD-10-CM

## 2014-10-23 NOTE — Progress Notes (Signed)
Subjective:     Patient ID: [name], male   DOB: 13-Jul-1925, 80 y.o.   MRN: [id number]  Toe Pain    poor health 80 year old individual presents with nail disease and lesions on the left foot that are painful stating he cannot cut his toenails and they become painful in shoe   Review of Systems  All other systems reviewed and are negative.      Objective:   Physical Exam  Nursing note and vitals reviewed. Constitutional: He is oriented to person, place, and time.  Cardiovascular: Intact distal pulses.   Neurological: He is oriented to person, place, and time.  Skin: Skin is warm and dry.   neurovascular status found to be diminished but intact with edema in the forefoot of both feet mild varicosities dry skin and keratotic lesion fifth digit left sub-fifth and third metatarsals left along with thick yellow brittle nailbeds 1-5 of both feet. Digits are well-perfused with diminished hair growth noted and diminished arch height and patient is well oriented x3    Assessment:     Significant vascular issues with keratotic lesion formation and nail disease 1-5 both feet that are painful    Plan:     H&P performed and condition discussed explained. At this point I went ahead and I debrided nailbeds 1-5 both feet and debridement lesions on the left foot with no iatrogenic bleeding noted. This will be done periodically and will be seen back as needed

## 2014-10-30 ENCOUNTER — Ambulatory Visit: Payer: Medicare Other

## 2014-10-30 ENCOUNTER — Ambulatory Visit (INDEPENDENT_AMBULATORY_CARE_PROVIDER_SITE_OTHER): Payer: Medicare Other | Admitting: Family

## 2014-10-30 DIAGNOSIS — I482 Chronic atrial fibrillation, unspecified: Secondary | ICD-10-CM

## 2014-10-30 DIAGNOSIS — Z23 Encounter for immunization: Secondary | ICD-10-CM

## 2014-10-30 DIAGNOSIS — Z5181 Encounter for therapeutic drug level monitoring: Secondary | ICD-10-CM | POA: Diagnosis not present

## 2014-10-30 LAB — POCT INR: INR: 2.1

## 2014-10-30 NOTE — Patient Instructions (Signed)
Continue to take 1 tablet daily (5 mg).  Re-check in 6 weeks.  Anticoagulation Dose Instructions as of 10/30/2014      [name] [name] Tue Wed Thu Fri Sat   New Dose 5 mg 5 mg 5 mg 5 mg 5 mg 5 mg 5 mg    Description        Continue to take 1 tablet daily (5 mg).  Re-check in 6 weeks.

## 2014-10-30 NOTE — Addendum Note (Signed)
Addended by: Colleen Can on: 10/30/2014 02:30 PM   Modules accepted: Orders

## 2014-10-31 DIAGNOSIS — D509 Iron deficiency anemia, unspecified: Secondary | ICD-10-CM | POA: Diagnosis not present

## 2014-10-31 DIAGNOSIS — M069 Rheumatoid arthritis, unspecified: Secondary | ICD-10-CM | POA: Diagnosis not present

## 2014-10-31 DIAGNOSIS — R770 Abnormality of albumin: Secondary | ICD-10-CM | POA: Diagnosis not present

## 2014-10-31 DIAGNOSIS — M25562 Pain in left knee: Secondary | ICD-10-CM | POA: Diagnosis not present

## 2014-11-04 ENCOUNTER — Other Ambulatory Visit: Payer: Self-pay | Admitting: Cardiology

## 2014-11-17 ENCOUNTER — Other Ambulatory Visit: Payer: Self-pay | Admitting: Cardiology

## 2014-11-20 ENCOUNTER — Other Ambulatory Visit: Payer: Self-pay

## 2014-12-10 ENCOUNTER — Other Ambulatory Visit: Payer: Self-pay | Admitting: Cardiology

## 2014-12-11 ENCOUNTER — Ambulatory Visit (INDEPENDENT_AMBULATORY_CARE_PROVIDER_SITE_OTHER): Payer: Medicare Other | Admitting: Family

## 2014-12-11 DIAGNOSIS — I482 Chronic atrial fibrillation, unspecified: Secondary | ICD-10-CM

## 2014-12-11 DIAGNOSIS — Z5181 Encounter for therapeutic drug level monitoring: Secondary | ICD-10-CM | POA: Diagnosis not present

## 2014-12-11 LAB — POCT INR: INR: 2.9

## 2014-12-11 NOTE — Patient Instructions (Signed)
EAT AN EXTRA SERVING OF GREENS. Continue to take 1 tablet daily (5 mg).  Re-check in 6 weeks.  Anticoagulation Dose Instructions as of 12/11/2014      [name] [name] Tue Wed Thu Fri Sat   New Dose 5 mg 5 mg 5 mg 5 mg 5 mg 5 mg 5 mg    Description        EAT AN EXTRA SERVING OF GREENS. Continue to take 1 tablet daily (5 mg).  Re-check in 6 weeks.

## 2014-12-27 DIAGNOSIS — M0600 Rheumatoid arthritis without rheumatoid factor, unspecified site: Secondary | ICD-10-CM | POA: Diagnosis not present

## 2014-12-27 DIAGNOSIS — R770 Abnormality of albumin: Secondary | ICD-10-CM | POA: Diagnosis not present

## 2014-12-27 DIAGNOSIS — D509 Iron deficiency anemia, unspecified: Secondary | ICD-10-CM | POA: Diagnosis not present

## 2014-12-27 DIAGNOSIS — M25562 Pain in left knee: Secondary | ICD-10-CM | POA: Diagnosis not present

## 2015-01-01 ENCOUNTER — Ambulatory Visit (INDEPENDENT_AMBULATORY_CARE_PROVIDER_SITE_OTHER): Payer: Medicare Other | Admitting: Cardiology

## 2015-01-01 ENCOUNTER — Encounter: Payer: Self-pay | Admitting: Cardiology

## 2015-01-01 VITALS — BP 124/72 | HR 79 | Ht 72.0 in | Wt 167.0 lb

## 2015-01-01 DIAGNOSIS — I509 Heart failure, unspecified: Secondary | ICD-10-CM | POA: Diagnosis not present

## 2015-01-01 DIAGNOSIS — I5022 Chronic systolic (congestive) heart failure: Secondary | ICD-10-CM

## 2015-01-01 DIAGNOSIS — I2510 Atherosclerotic heart disease of native coronary artery without angina pectoris: Secondary | ICD-10-CM | POA: Diagnosis not present

## 2015-01-01 DIAGNOSIS — I482 Chronic atrial fibrillation, unspecified: Secondary | ICD-10-CM

## 2015-01-01 NOTE — Patient Instructions (Signed)
Your physician has recommended you make the following change in your medication:   STOP TAKING POTASSIUM NOW   Your physician recommends that you return for lab work in: [place] ([facility])   Your physician has requested that you have an echocardiogram. Echocardiography is a painless test that uses sound waves to create images of your heart. It provides your doctor with information about the size and shape of your heart and how well your heart's chambers and valves are working. This procedure takes approximately one hour. There are no restrictions for this procedure.     Your physician wants you to follow-up in: [place] will receive a reminder letter in the mail two months in advance. If you don't receive a letter, please call our office to schedule the follow-up appointment.

## 2015-01-01 NOTE — Progress Notes (Signed)
Patient ID: [name] [name], male   DOB: [birth year], 81 y.o.   MRN: [id number]    Patient Name: [name] [name] Date of Encounter: 01/01/2015  Primary Care Provider:  [name], MD Primary Cardiologist:  Dorothy Spark (formerly Dr. Verl Blalock)  Problem List   Past Medical History  Diagnosis Date  . Carotid artery occlusion     right total internal artery occlusion  . Coronary artery disease     s/p cardiac cath in 1990s, and cardiolite study  in 2005 showing EF 54%  . Hyperlipidemia   . Hypertension   . TIA (transient ischemic attack)   . Degenerative disc disease     cervical spine  . Arrhythmia   . Atrial fibrillation 03/2011  . Myocardial infarction 1992 and 1994  . Angina     "jaw & elbow was the worse"  . Pneumonia ~ 1935  . GERD (gastroesophageal reflux disease)   . Skin cancer     forehead  . Urgency incontinence   . Bilateral cellulitis of lower leg    Past Surgical History  Procedure Laterality Date  . Cataract extraction w/ intraocular lens  implant, bilateral    . Cardiac catheterization  1990s  . Skin cancer excision  12/2011    forehead    Allergies  Allergies  Allergen Reactions  . Levofloxacin Rash    "thought they were sticking swords thru me"  . Klor-Con [Potassium Chloride Er] Diarrhea    DIARRHEA    HPI  [name] [name] returns today for evaluation and management of his history of coronary artery disease, history of chronic A. fib, anticoagulation. He also has carotid artery disease which is followed by the vascular surgery group.  He denies any symptoms of TIAs. His last sono carotid on 09/12/14 was unchanged from prior, with chronic right ICA occlusion and  < 40% stenosis in left ICA. He still very active doing all of his yard work. He was scraping some snow this morning. He denies any chest pain, shortness of breath, palpitations presyncope or syncope. He denies any bleeding or melena. He is overdue for blood work.  07/10/2014 - no  complaints, no more chest pain, SOB or palpitations. Stable LE edema.   10/12/2014 - the patient is coming for 3 months follow-up, he continues to have same all over extremity edema. He is unable to prolonged compression stockings. But he is veering on a regular stocking they're giving him significant compression. Patient states that he spends most of his stay on his feet as he is working around the house and he is taking care of his wife with Alzheimer. He is able to lay flat at night and denies any angina, dyspnea on exertion, orthopnea or paroxysmal nocturnal dyspnea.  06/26/2015 - the patient is coming after 3 months, at the last visit we increased his Lasix to 40 mg daily. And we started him on potassium supplements as his potassium was 3.2. The patient states that his lower extremity edema has slightly improved. He is hard time swallowing large potassium pills. He denies any shortness of breath, chest pain palpitations or syncope. He has been married to his wife for 32 years she currently has severe Alzheimer and she is her primary caretaker.  Home Medications  Prior to Admission medications   Medication Sig Start Date End Date Taking? Authorizing Provider  aspirin 81 MG tablet Take 81 mg by mouth at bedtime.     Historical Provider, MD  atorvastatin (LIPITOR) 10 MG tablet  TAKE 1 TABLET AT BEDTIME 10/03/13   Dorothy Spark, MD  Calcium-Vitamin D (CALTRATE 600 PLUS-VIT D PO) Take 1 tablet by mouth at bedtime.     Historical Provider, MD  digoxin (LANOXIN) 0.125 MG tablet Take 0.125 mg by mouth daily.    Historical Provider, MD  furosemide (LASIX) 20 MG tablet Take 20 mg by mouth daily.    Historical Provider, MD  levothyroxine (SYNTHROID, LEVOTHROID) 100 MCG tablet TAKE 1 TABLET DAILY 09/22/13   Dorena Cookey, MD  methotrexate (RHEUMATREX) 2.5 MG tablet Take 5-7.5 mg by mouth 2 (two) times a week. Take 3 tablets on Saturday and 2 tablets on sunday    Historical Provider, MD  metoprolol  succinate (TOPROL-XL) 50 MG 24 hr tablet Take 50 mg by mouth 2 (two) times daily. Take with or immediately following a meal.    Historical Provider, MD  metoprolol succinate (TOPROL-XL) 50 MG 24 hr tablet TAKE 1 TABLET TWICE A DAY 11/27/13   Dorothy Spark, MD  Multiple Vitamin (MULTIVITAMIN WITH MINERALS) TABS tablet Take 1 tablet by mouth daily.    Historical Provider, MD  nitroGLYCERIN (NITROSTAT) 0.4 MG SL tablet Place 0.4 mg under the tongue every 5 (five) minutes as needed. For chest pain 02/24/11   Renella Cunas, MD  potassium chloride SA (K-DUR,KLOR-CON) 20 MEQ tablet Take 1 tablet (20 mEq total) by mouth daily. 07/05/13   Eulas Post, MD  traMADol (ULTRAM) 50 MG tablet Take 50 mg by mouth every 8 (eight) hours as needed. For pain    Historical Provider, MD  warfarin (COUMADIN) 2.5 MG tablet Take as directed by anticoagulation clinic 11/10/13   Dorena Cookey, MD    Family History  Family History  Problem Relation Age of Onset  . Heart disease Mother   . Malignant hyperthermia Mother   . Hypertension Mother   . Heart disease Father   . Malignant hyperthermia Father   . Hyperlipidemia Father   . Hypertension Father   . Heart disease Sister   . Hypertension Sister   . Heart disease Brother     Social History  History   Social History  . Marital Status: Married    Spouse Name: N/A    Number of Children: N/A  . Years of Education: N/A   Occupational History  . Not on file.   Social History Main Topics  . Smoking status: Former Smoker -- 1.00 packs/day for 40 years    Types: Cigarettes    Quit date: 09/12/1989  . Smokeless tobacco: Former Systems developer    Quit date: 11/26/1990     Comment: quit around 1992  . Alcohol Use: No     Comment: former, quit drinking in 1992  . Drug Use: No  . Sexual Activity: Not Currently   Other Topics Concern  . 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  Cardiovascular:  No chest pain, dyspnea on exertion, edema, orthopnea, palpitations, paroxysmal nocturnal dyspnea. Dermatological: No rash, lesions/masses Respiratory: No cough, dyspnea Urologic: No hematuria, dysuria Abdominal:   No nausea, vomiting, diarrhea, bright red blood per rectum, melena, or hematemesis Neurologic:  No visual changes, wkns, changes in  mental status. All other systems reviewed and are otherwise negative except as noted above.  Physical Exam  Height 6' (1.829 m), weight 167 lb (75.751 kg).  General: Pleasant, NAD Psych: Normal affect. Neuro: Alert and oriented X 3. Moves all extremities spontaneously. HEENT: Normal  Neck: Supple without bruits or JVD. Lungs:  Resp regular and unlabored, CTA. Heart: RRR no s3, s4, or murmurs. Abdomen: Soft, non-tender, non-distended, BS + x 4.  Extremities: No clubbing, cyanosis, + 2 LE edema up to the knees DP/PT/Radials 2+ and equal bilaterally.  Labs:  No results for input(s): CKTOTAL, CKMB, TROPONINI in the last 72 hours. Lab Results  Component Value Date   WBC 5.5 05/19/2014   HGB 10.2* 05/19/2014   HCT 31.9* 05/19/2014   MCV 94.4 05/19/2014   PLT 203 05/19/2014   No results for input(s): NA, K, CL, CO2, BUN, CREATININE, CALCIUM, PROT, BILITOT, ALKPHOS, ALT, AST, GLUCOSE in the last 168 hours.  Invalid input(s): LABALBU Lab Results  Component Value Date   CHOL 101 12/28/2013   HDL 32.70* 12/28/2013   LDLCALC 54 12/28/2013   TRIG 71.0 12/28/2013   No results found for: DDIMER Invalid input(s): POCBNP  Accessory Clinical Findings  Echocardiogram 11/27/2011  - Left ventricle: The cavity size was normal. Systolic function was mildly reduced. The estimated ejection fraction was in the range of 45% to 50%. Wall motion was normal;  there were no regional wall motion abnormalities. - Mitral valve: Mild regurgitation. - Left atrium: The atrium was mildly to moderately dilated. - Right ventricle: The cavity size was mildly dilated. Wall thickness was normal. Systolic function was mildly reduced. - Right atrium: The atrium was moderately dilated. - Pulmonary arteries: Systolic pressure was mildly to moderately increased.  ECG - atrial fibrillation, rate controlled 64 BPM, nonspecific ST abnormality  Lexiscan nuclear stress test 05/31/2014 Impression  Exercise Capacity: Lexiscan with no exercise.  BP Response: Normal blood pressure response.  Clinical Symptoms: No significant symptoms noted.  ECG Impression: There are scattered PVCs.  Comparison with Prior Nuclear Study: 2013 - mild apical thinning, non-gated  Overall Impression: Low risk stress nuclear study with small fixed inferoapical defect, which could represent thinning. No ischemia.    Assessment & Plan  1. CAD - stable, continue ASA, lipitor, metoprolol XL, negative stress test on 05/31/14, no more chest pain, stays active. EKG shows unchanged nonspecific changes in the lateral leads patient is asymptomatic and not further ischemic workup necessary at this point.  2. Chronic systolic CHF - LVEF 27-06%, on lasix 40 mg po daily, the patient has slightly improved chronic pitting LE edema, the patient used diet stockings at night as he can't put the compression stocking on. He is encouraged to continue doing that. We'll stop potassium supplements and just encouraged to drink orange juice and eat food rich in potassium.  3. Carotid disease - His last sono carotid on 09/12/14 was unchanged from prior, with chronic right ICA occlusion and  < 40% stenosis in left ICA.  4. Chronic persistent A-fib - rate controlled, INR therapeutic, no falls, we will continue Coumadin for now.  5. Lipids - we will check  Follow-up in 6 months.   Dorothy Spark, MD,  [facility] 01/01/2015, 2:21 PM

## 2015-01-02 ENCOUNTER — Telehealth: Payer: Self-pay | Admitting: Cardiology

## 2015-01-02 LAB — BASIC METABOLIC PANEL
BUN: 9 mg/dL (ref 6–23)
CO2: 34 mEq/L — ABNORMAL HIGH (ref 19–32)
Calcium: 9 mg/dL (ref 8.4–10.5)
Chloride: 101 mEq/L (ref 96–112)
Creatinine, Ser: 0.84 mg/dL (ref 0.40–1.50)
GFR: 91.77 mL/min (ref >60.00)
Glucose, Bld: 60 mg/dL — ABNORMAL LOW (ref 70–99)
Potassium: 3.4 mEq/L — ABNORMAL LOW (ref 3.5–5.1)
Sodium: 140 mEq/L (ref 135–145)

## 2015-01-02 NOTE — Telephone Encounter (Signed)
Pt calling to ask for lab results. Noted that results have not been reviewed by Dr Meda Coffee yet.  Routed the results to Dr Meda Coffee for further review and interpretation and will follow-up with the pt thereafter.

## 2015-01-02 NOTE — Telephone Encounter (Signed)
Notified the pt that per Dr Meda Coffee his lab results were normal all but his K was slightly low.  Informed the pt that per Dr Meda Coffee she did advised him on how to increase his K in his diet by going over different food choices from his OV on 2/8.  Pt verbalized understanding, agreed with conversation with Dr Meda Coffee on 2/8 and agrees with this plan.

## 2015-01-02 NOTE — Telephone Encounter (Signed)
New message      Want lab results.  Please call before 1pm or call tomorrow

## 2015-01-04 ENCOUNTER — Ambulatory Visit (HOSPITAL_COMMUNITY): Payer: Medicare Other | Attending: Cardiology

## 2015-01-04 DIAGNOSIS — I482 Chronic atrial fibrillation, unspecified: Secondary | ICD-10-CM

## 2015-01-04 DIAGNOSIS — I509 Heart failure, unspecified: Secondary | ICD-10-CM | POA: Insufficient documentation

## 2015-01-04 NOTE — Progress Notes (Signed)
2D Echo completed. 01/04/2015

## 2015-01-18 ENCOUNTER — Ambulatory Visit (INDEPENDENT_AMBULATORY_CARE_PROVIDER_SITE_OTHER): Payer: Medicare Other | Admitting: General Practice

## 2015-01-18 DIAGNOSIS — Z5181 Encounter for therapeutic drug level monitoring: Secondary | ICD-10-CM | POA: Diagnosis not present

## 2015-01-18 LAB — POCT INR: INR: 1.9

## 2015-01-18 NOTE — Progress Notes (Signed)
Pre visit review using our clinic review tool, if applicable. No additional management support is needed unless otherwise documented below in the visit note. 

## 2015-01-22 ENCOUNTER — Ambulatory Visit (INDEPENDENT_AMBULATORY_CARE_PROVIDER_SITE_OTHER): Payer: Medicare Other | Admitting: Podiatry

## 2015-01-22 DIAGNOSIS — M79673 Pain in unspecified foot: Secondary | ICD-10-CM

## 2015-01-22 DIAGNOSIS — B351 Tinea unguium: Secondary | ICD-10-CM | POA: Diagnosis not present

## 2015-01-22 DIAGNOSIS — L84 Corns and callosities: Secondary | ICD-10-CM | POA: Diagnosis not present

## 2015-01-25 NOTE — Progress Notes (Signed)
Subjective:     Patient ID: [name] [name], male   DOB: 1925-11-20, 81 y.o.   MRN: [id number]  HPI patient presents with thick yellow nailbeds 1-5 both feet that are painful when when he cuts or when he tries to wear shoe gear   Review of Systems     Objective:   Physical Exam Neurovascular status intact with thick yellow brittle nailbeds 1-5 both feet    Assessment:     Mycotic nail infections with pain 1-5 both feet    Plan:     Debride painful nailbeds 1-5 both feet with no iatrogenic bleeding noted

## 2015-01-29 ENCOUNTER — Other Ambulatory Visit: Payer: Self-pay | Admitting: Cardiology

## 2015-02-15 ENCOUNTER — Ambulatory Visit (INDEPENDENT_AMBULATORY_CARE_PROVIDER_SITE_OTHER): Payer: Medicare Other | Admitting: General Practice

## 2015-02-15 DIAGNOSIS — Z5181 Encounter for therapeutic drug level monitoring: Secondary | ICD-10-CM | POA: Diagnosis not present

## 2015-02-15 LAB — POCT INR: INR: 1.5

## 2015-02-15 NOTE — Progress Notes (Signed)
Pre visit review using our clinic review tool, if applicable. No additional management support is needed unless otherwise documented below in the visit note. 

## 2015-02-17 ENCOUNTER — Emergency Department (HOSPITAL_BASED_OUTPATIENT_CLINIC_OR_DEPARTMENT_OTHER): Payer: Medicare Other

## 2015-02-17 ENCOUNTER — Inpatient Hospital Stay (HOSPITAL_BASED_OUTPATIENT_CLINIC_OR_DEPARTMENT_OTHER)
Admission: EM | Admit: 2015-02-17 | Discharge: 2015-02-23 | DRG: 504 | Disposition: A | Discharge status: Skilled Nursing Facility | Payer: Medicare Other | Attending: Internal Medicine | Admitting: Internal Medicine

## 2015-02-17 ENCOUNTER — Encounter (HOSPITAL_BASED_OUTPATIENT_CLINIC_OR_DEPARTMENT_OTHER): Payer: Self-pay

## 2015-02-17 DIAGNOSIS — Z9889 Other specified postprocedural states: Secondary | ICD-10-CM | POA: Diagnosis not present

## 2015-02-17 DIAGNOSIS — L03115 Cellulitis of right lower limb: Secondary | ICD-10-CM | POA: Diagnosis present

## 2015-02-17 DIAGNOSIS — J811 Chronic pulmonary edema: Secondary | ICD-10-CM | POA: Diagnosis not present

## 2015-02-17 DIAGNOSIS — Z7901 Long term (current) use of anticoagulants: Secondary | ICD-10-CM

## 2015-02-17 DIAGNOSIS — Z9842 Cataract extraction status, left eye: Secondary | ICD-10-CM

## 2015-02-17 DIAGNOSIS — E038 Other specified hypothyroidism: Secondary | ICD-10-CM | POA: Diagnosis not present

## 2015-02-17 DIAGNOSIS — Z9841 Cataract extraction status, right eye: Secondary | ICD-10-CM

## 2015-02-17 DIAGNOSIS — I10 Essential (primary) hypertension: Secondary | ICD-10-CM | POA: Diagnosis present

## 2015-02-17 DIAGNOSIS — I2510 Atherosclerotic heart disease of native coronary artery without angina pectoris: Secondary | ICD-10-CM | POA: Diagnosis present

## 2015-02-17 DIAGNOSIS — Z79899 Other long term (current) drug therapy: Secondary | ICD-10-CM

## 2015-02-17 DIAGNOSIS — R197 Diarrhea, unspecified: Secondary | ICD-10-CM | POA: Diagnosis not present

## 2015-02-17 DIAGNOSIS — I509 Heart failure, unspecified: Secondary | ICD-10-CM

## 2015-02-17 DIAGNOSIS — J45909 Unspecified asthma, uncomplicated: Secondary | ICD-10-CM | POA: Diagnosis present

## 2015-02-17 DIAGNOSIS — I252 Old myocardial infarction: Secondary | ICD-10-CM | POA: Diagnosis not present

## 2015-02-17 DIAGNOSIS — E440 Moderate protein-calorie malnutrition: Secondary | ICD-10-CM | POA: Diagnosis not present

## 2015-02-17 DIAGNOSIS — I5033 Acute on chronic diastolic (congestive) heart failure: Secondary | ICD-10-CM | POA: Diagnosis present

## 2015-02-17 DIAGNOSIS — M869 Osteomyelitis, unspecified: Secondary | ICD-10-CM | POA: Diagnosis not present

## 2015-02-17 DIAGNOSIS — M86671 Other chronic osteomyelitis, right ankle and foot: Secondary | ICD-10-CM | POA: Diagnosis present

## 2015-02-17 DIAGNOSIS — E876 Hypokalemia: Secondary | ICD-10-CM | POA: Diagnosis present

## 2015-02-17 DIAGNOSIS — Z961 Presence of intraocular lens: Secondary | ICD-10-CM | POA: Diagnosis present

## 2015-02-17 DIAGNOSIS — E785 Hyperlipidemia, unspecified: Secondary | ICD-10-CM | POA: Diagnosis present

## 2015-02-17 DIAGNOSIS — I517 Cardiomegaly: Secondary | ICD-10-CM | POA: Diagnosis not present

## 2015-02-17 DIAGNOSIS — I482 Chronic atrial fibrillation: Secondary | ICD-10-CM | POA: Diagnosis present

## 2015-02-17 DIAGNOSIS — D649 Anemia, unspecified: Secondary | ICD-10-CM | POA: Diagnosis present

## 2015-02-17 DIAGNOSIS — Z8673 Personal history of transient ischemic attack (TIA), and cerebral infarction without residual deficits: Secondary | ICD-10-CM | POA: Diagnosis not present

## 2015-02-17 DIAGNOSIS — I4891 Unspecified atrial fibrillation: Secondary | ICD-10-CM | POA: Diagnosis not present

## 2015-02-17 DIAGNOSIS — I5022 Chronic systolic (congestive) heart failure: Secondary | ICD-10-CM | POA: Diagnosis not present

## 2015-02-17 DIAGNOSIS — M86171 Other acute osteomyelitis, right ankle and foot: Secondary | ICD-10-CM | POA: Diagnosis not present

## 2015-02-17 DIAGNOSIS — M8660 Other chronic osteomyelitis, unspecified site: Secondary | ICD-10-CM

## 2015-02-17 DIAGNOSIS — Z85828 Personal history of other malignant neoplasm of skin: Secondary | ICD-10-CM

## 2015-02-17 DIAGNOSIS — E039 Hypothyroidism, unspecified: Secondary | ICD-10-CM | POA: Diagnosis present

## 2015-02-17 DIAGNOSIS — E43 Unspecified severe protein-calorie malnutrition: Secondary | ICD-10-CM | POA: Insufficient documentation

## 2015-02-17 DIAGNOSIS — R279 Unspecified lack of coordination: Secondary | ICD-10-CM | POA: Diagnosis not present

## 2015-02-17 DIAGNOSIS — Z6823 Body mass index (BMI) 23.0-23.9, adult: Secondary | ICD-10-CM

## 2015-02-17 DIAGNOSIS — R488 Other symbolic dysfunctions: Secondary | ICD-10-CM | POA: Diagnosis not present

## 2015-02-17 DIAGNOSIS — J449 Chronic obstructive pulmonary disease, unspecified: Secondary | ICD-10-CM | POA: Diagnosis present

## 2015-02-17 DIAGNOSIS — L97519 Non-pressure chronic ulcer of other part of right foot with unspecified severity: Secondary | ICD-10-CM | POA: Diagnosis present

## 2015-02-17 DIAGNOSIS — M79676 Pain in unspecified toe(s): Secondary | ICD-10-CM | POA: Diagnosis not present

## 2015-02-17 DIAGNOSIS — E46 Unspecified protein-calorie malnutrition: Secondary | ICD-10-CM | POA: Diagnosis present

## 2015-02-17 DIAGNOSIS — R262 Difficulty in walking, not elsewhere classified: Secondary | ICD-10-CM | POA: Diagnosis not present

## 2015-02-17 DIAGNOSIS — I350 Nonrheumatic aortic (valve) stenosis: Secondary | ICD-10-CM | POA: Diagnosis not present

## 2015-02-17 DIAGNOSIS — K219 Gastro-esophageal reflux disease without esophagitis: Secondary | ICD-10-CM | POA: Diagnosis present

## 2015-02-17 DIAGNOSIS — E568 Deficiency of other vitamins: Secondary | ICD-10-CM | POA: Diagnosis not present

## 2015-02-17 DIAGNOSIS — Z87891 Personal history of nicotine dependence: Secondary | ICD-10-CM | POA: Diagnosis not present

## 2015-02-17 DIAGNOSIS — R079 Chest pain, unspecified: Secondary | ICD-10-CM | POA: Diagnosis not present

## 2015-02-17 DIAGNOSIS — Z7982 Long term (current) use of aspirin: Secondary | ICD-10-CM | POA: Diagnosis not present

## 2015-02-17 DIAGNOSIS — M1990 Unspecified osteoarthritis, unspecified site: Secondary | ICD-10-CM | POA: Diagnosis not present

## 2015-02-17 DIAGNOSIS — Z23 Encounter for immunization: Secondary | ICD-10-CM

## 2015-02-17 DIAGNOSIS — Z4781 Encounter for orthopedic aftercare following surgical amputation: Secondary | ICD-10-CM | POA: Diagnosis not present

## 2015-02-17 DIAGNOSIS — M6281 Muscle weakness (generalized): Secondary | ICD-10-CM | POA: Diagnosis not present

## 2015-02-17 DIAGNOSIS — G8929 Other chronic pain: Secondary | ICD-10-CM | POA: Diagnosis not present

## 2015-02-17 DIAGNOSIS — M89571 Osteolysis, right ankle and foot: Secondary | ICD-10-CM | POA: Diagnosis not present

## 2015-02-17 LAB — PROTIME-INR
INR: 2.02 — ABNORMAL HIGH (ref 0.00–1.49)
Prothrombin Time: 22.9 seconds — ABNORMAL HIGH (ref 11.6–15.2)

## 2015-02-17 LAB — CBC WITH DIFFERENTIAL/PLATELET
BASOS ABS: 0 10*3/uL (ref 0.0–0.1)
Basophils Relative: 0 % (ref 0–1)
Eosinophils Absolute: 0.1 10*3/uL (ref 0.0–0.7)
Eosinophils Relative: 2 % (ref 0–5)
HEMATOCRIT: 30.9 % — AB (ref 39.0–52.0)
Hemoglobin: 10 g/dL — ABNORMAL LOW (ref 13.0–17.0)
LYMPHS ABS: 1.1 10*3/uL (ref 0.7–4.0)
Lymphocytes Relative: 19 % (ref 12–46)
MCH: 30.4 pg (ref 26.0–34.0)
MCHC: 32.4 g/dL (ref 30.0–36.0)
MCV: 93.9 fL (ref 78.0–100.0)
Monocytes Absolute: 0.8 10*3/uL (ref 0.1–1.0)
Monocytes Relative: 13 % — ABNORMAL HIGH (ref 3–12)
NEUTROS ABS: 4 10*3/uL (ref 1.7–7.7)
Neutrophils Relative %: 66 % (ref 43–77)
PLATELETS: 253 10*3/uL (ref 150–400)
RBC: 3.29 MIL/uL — ABNORMAL LOW (ref 4.22–5.81)
RDW: 18.6 % — AB (ref 11.5–15.5)
WBC: 6.1 10*3/uL (ref 4.0–10.5)

## 2015-02-17 LAB — BASIC METABOLIC PANEL
Anion gap: 6 (ref 5–15)
BUN: 8 mg/dL (ref 6–23)
CO2: 33 mmol/L — ABNORMAL HIGH (ref 19–32)
Calcium: 8.6 mg/dL (ref 8.4–10.5)
Chloride: 99 mmol/L (ref 96–112)
Creatinine, Ser: 0.87 mg/dL (ref 0.50–1.35)
GFR calc non Af Amer: 75 mL/min — ABNORMAL LOW (ref >90)
GFR, EST AFRICAN AMERICAN: 87 mL/min — AB (ref >90)
Glucose, Bld: 91 mg/dL (ref 70–99)
Potassium: 3.3 mmol/L — ABNORMAL LOW (ref 3.5–5.1)
SODIUM: 138 mmol/L (ref 135–145)

## 2015-02-17 LAB — HEPATIC FUNCTION PANEL
ALK PHOS: 70 U/L (ref 39–117)
ALT: 16 U/L (ref 0–53)
AST: 29 U/L (ref 0–37)
Albumin: 3.7 g/dL (ref 3.5–5.2)
BILIRUBIN DIRECT: 0.1 mg/dL (ref 0.0–0.5)
BILIRUBIN INDIRECT: 0 mg/dL — AB (ref 0.3–0.9)
TOTAL PROTEIN: 6.3 g/dL (ref 6.0–8.3)
Total Bilirubin: 0.1 mg/dL — ABNORMAL LOW (ref 0.3–1.2)

## 2015-02-17 LAB — SEDIMENTATION RATE: SED RATE: 23 mm/h — AB (ref 0–16)

## 2015-02-17 MED ORDER — DIGOXIN 125 MCG PO TABS
125.0000 ug | ORAL_TABLET | Freq: Every day | ORAL | Status: DC
  Administered 2015-02-18 – 2015-02-23 (×6): 125 ug via ORAL
  Filled 2015-02-17 (×7): qty 1

## 2015-02-17 MED ORDER — ASPIRIN 81 MG PO CHEW
81.0000 mg | CHEWABLE_TABLET | Freq: Every day | ORAL | Status: DC
  Administered 2015-02-18 – 2015-02-22 (×6): 81 mg via ORAL
  Filled 2015-02-17 (×6): qty 1

## 2015-02-17 MED ORDER — ACETAMINOPHEN 325 MG PO TABS
650.0000 mg | ORAL_TABLET | Freq: Four times a day (QID) | ORAL | Status: DC | PRN
  Administered 2015-02-20: 650 mg via ORAL
  Filled 2015-02-17 (×3): qty 2

## 2015-02-17 MED ORDER — ONDANSETRON HCL 4 MG/2ML IJ SOLN: 4.0000 mg | Freq: Three times a day (TID) | INTRAMUSCULAR | Status: AC | PRN

## 2015-02-17 MED ORDER — FUROSEMIDE 40 MG PO TABS
40.0000 mg | ORAL_TABLET | Freq: Every day | ORAL | Status: DC
  Administered 2015-02-19 – 2015-02-23 (×5): 40 mg via ORAL
  Filled 2015-02-17 (×8): qty 1

## 2015-02-17 MED ORDER — VANCOMYCIN HCL IN DEXTROSE 750-5 MG/150ML-% IV SOLN
750.0000 mg | Freq: Two times a day (BID) | INTRAVENOUS | Status: DC
  Administered 2015-02-17 – 2015-02-19 (×5): 750 mg via INTRAVENOUS
  Filled 2015-02-17 (×7): qty 150

## 2015-02-17 MED ORDER — LEVOTHYROXINE SODIUM 100 MCG PO TABS
100.0000 ug | ORAL_TABLET | Freq: Every day | ORAL | Status: DC
  Administered 2015-02-18 – 2015-02-23 (×6): 100 ug via ORAL
  Filled 2015-02-17 (×7): qty 1

## 2015-02-17 MED ORDER — HYDROMORPHONE HCL 1 MG/ML IJ SOLN: 1.0000 mg | INTRAMUSCULAR | Status: AC | PRN

## 2015-02-17 MED ORDER — DOCUSATE SODIUM 100 MG PO CAPS
100.0000 mg | ORAL_CAPSULE | Freq: Two times a day (BID) | ORAL | Status: DC
  Administered 2015-02-18 – 2015-02-23 (×8): 100 mg via ORAL
  Filled 2015-02-17 (×10): qty 1

## 2015-02-17 MED ORDER — ONDANSETRON HCL 4 MG/2ML IJ SOLN: 4.0000 mg | Freq: Four times a day (QID) | INTRAMUSCULAR | Status: DC | PRN

## 2015-02-17 MED ORDER — ONDANSETRON HCL 4 MG PO TABS: 4.0000 mg | ORAL_TABLET | Freq: Four times a day (QID) | ORAL | Status: DC | PRN

## 2015-02-17 MED ORDER — ATORVASTATIN CALCIUM 10 MG PO TABS
10.0000 mg | ORAL_TABLET | Freq: Every day | ORAL | Status: DC
  Administered 2015-02-18 – 2015-02-22 (×6): 10 mg via ORAL
  Filled 2015-02-17 (×6): qty 1

## 2015-02-17 MED ORDER — HEPARIN (PORCINE) IN NACL 100-0.45 UNIT/ML-% IJ SOLN
1000.0000 [IU]/h | INTRAMUSCULAR | Status: DC
  Administered 2015-02-18: 1000 [IU]/h via INTRAVENOUS
  Filled 2015-02-17: qty 250

## 2015-02-17 MED ORDER — ACETAMINOPHEN 650 MG RE SUPP: 650.0000 mg | Freq: Four times a day (QID) | RECTAL | Status: DC | PRN

## 2015-02-17 MED ORDER — METOPROLOL SUCCINATE ER 50 MG PO TB24
50.0000 mg | ORAL_TABLET | Freq: Two times a day (BID) | ORAL | Status: DC
  Administered 2015-02-18 – 2015-02-22 (×10): 50 mg via ORAL
  Filled 2015-02-17 (×12): qty 1

## 2015-02-17 MED ORDER — VANCOMYCIN HCL IN DEXTROSE 1-5 GM/200ML-% IV SOLN
INTRAVENOUS | Status: AC
  Administered 2015-02-17: 750 mg via INTRAVENOUS
  Filled 2015-02-17: qty 200

## 2015-02-17 MED ORDER — MORPHINE SULFATE 2 MG/ML IJ SOLN
2.0000 mg | INTRAMUSCULAR | Status: DC | PRN
  Administered 2015-02-21 – 2015-02-22 (×4): 2 mg via INTRAVENOUS
  Filled 2015-02-17 (×4): qty 1

## 2015-02-17 NOTE — ED Notes (Signed)
Pt had callous removal on right middle toe about 1 month ago, now with redness, swelling, pain and drainage.  No fever.

## 2015-02-17 NOTE — Progress Notes (Addendum)
Patient will be evaluated in the morning.  Will likely need toe amputation.  Needs MRI of foot.  Needs to be off coumadin and INR <1.5 before proceeding with surgery. Continue IV abx.    Azucena Cecil, MD [place] 10:17 PM

## 2015-02-17 NOTE — Treatment Plan (Signed)
Discussed case with Monico Blitz. 81yo with hx of afib, TIA on chronic coumadin, CAD, HTN who is s/p callous removal by podiatry about 4 weeks ago over 3rd digit on R. Evidence of osteomyelitis on xray. Orthopedic surgery consulted, Dr. Erlinda Hong to see.

## 2015-02-17 NOTE — ED Notes (Signed)
R 3rd digit swollen, red, yellow and black, moist, draining, reports pain only with manipulation. (Denies: pain, nvd, fever or other sx). BLE also swollen, +4 pitting edema, PT palpable bilaterally. Takes coumadin and lasix. Sx ongoing for ~3 weeks. Reports had foot surgery ~4 weeks ago (Dr. Paulla Dolly, DPT). "Concern for gangrene". Family at [facility]. Pt alert, NAD, calm, interactive. Reports "does NOT have DM".

## 2015-02-17 NOTE — H&P (Signed)
[facility] History and Physical  [name] [name] BSW:[id number] DOB: Feb 11, 1925 DOA: 02/17/2015  Referring physician: Emergency Department PCP: [name], MD  Specialists:   Chief Complaint: R 3rd toe pain  HPI: [name] [name] is a 81 y.o. male  With a hx of afib, hx of TIA on chronic coumadin, HLD, CAD, hx of systolic chf who presented to [facility] with worsening redness, pain, and swelling of R 3rd toe s/p recent callous removal by podiatry about 4 weeks prior. In the ED, wound appeared actively draining with xray findings of osteomyelitis. Patient was started on vancomycin and Orthopedic surgery consulted. Hospitalist consulted for admission.  Review of Systems:   Review of Systems  Constitutional: Negative for fever and malaise/fatigue.  HENT: Negative for ear pain.   Eyes: Negative for discharge and redness.  Respiratory: Negative for shortness of breath, wheezing and stridor.   Cardiovascular: Negative for chest pain and orthopnea.  Gastrointestinal: Negative for nausea and abdominal pain.  Genitourinary: Negative for frequency and flank pain.  Musculoskeletal: Negative for joint pain and neck pain.  Neurological: Negative for tremors, seizures and loss of consciousness.  Psychiatric/Behavioral: Negative for depression and hallucinations.     Past Medical History  Diagnosis Date  . Carotid artery occlusion     right total internal artery occlusion  . Coronary artery disease     s/p cardiac cath in 1990s, and cardiolite study  in 2005 showing EF 54%  . Hyperlipidemia   . Hypertension   . TIA (transient ischemic attack)   . Degenerative disc disease     cervical spine  . Arrhythmia   . Atrial fibrillation 03/2011  . Myocardial infarction 1992 and 1994  . Angina     "jaw & elbow was the worse"  . Pneumonia ~ 1935  . GERD (gastroesophageal reflux disease)   . Skin cancer     forehead  . Urgency incontinence   . Bilateral cellulitis of lower leg     Past Surgical History  Procedure Laterality Date  . Cataract extraction w/ intraocular lens  implant, bilateral    . Cardiac catheterization  1990s  . Skin cancer excision  12/2011    forehead   Social History:  reports that he quit smoking about 25 years ago. His smoking use included Cigarettes. He has a 40 pack-year smoking history. He quit smokeless tobacco use about 24 years ago. He reports that he does not drink alcohol or use illicit drugs.  where does patient live--home, ALF, SNF? and with whom if at home?  Can patient participate in ADLs?  Allergies  Allergen Reactions  . Levofloxacin Rash    "thought they were sticking swords thru me"  . Klor-Con [Potassium Chloride Er] Diarrhea    DIARRHEA    Family History  Problem Relation Age of Onset  . Heart disease Mother   . Malignant hyperthermia Mother   . Hypertension Mother   . Heart disease Father   . Malignant hyperthermia Father   . Hyperlipidemia Father   . Hypertension Father   . Heart disease Sister   . Hypertension Sister   . Heart disease Brother     (be sure to complete)  Prior to Admission medications   Medication Sig Start Date End Date Taking? Authorizing Provider  aspirin 81 MG tablet Take 81 mg by mouth at bedtime.     Historical Provider, MD  atorvastatin (LIPITOR) 10 MG tablet TAKE 1 TABLET AT BEDTIME 01/29/15   Dorothy Spark,  MD  Calcium-Vitamin D (CALTRATE 600 PLUS-VIT D PO) Take 1 tablet by mouth at bedtime.     Historical Provider, MD  furosemide (LASIX) 40 MG tablet Take 1 tablet (40 mg total) by mouth daily. 07/10/14   Dorothy Spark, MD  HYDROcodone-homatropine [facility]) 5-1.5 MG/5ML syrup 1/2 - 1 teaspoon at bedtime as needed for cough Patient not taking: Reported on 01/22/2015 05/15/14   Dorena Cookey, MD  LANOXIN 125 MCG tablet TAKE 1 TABLET DAILY 11/06/14   Dorothy Spark, MD  levothyroxine (SYNTHROID, LEVOTHROID) 100 MCG tablet TAKE 1 TABLET DAILY 09/04/14   Dorena Cookey, MD   methotrexate (RHEUMATREX) 2.5 MG tablet Take 5-7.5 mg by mouth 2 (two) times a week. Take 3 tablets on Saturday and 2 tablets on sunday    Historical Provider, MD  metoprolol succinate (TOPROL-XL) 50 MG 24 hr tablet TAKE 1 TABLET TWICE A DAY 12/11/14   Dorothy Spark, MD  Multiple Vitamin (MULTIVITAMIN WITH MINERALS) TABS tablet Take 1 tablet by mouth daily.    Historical Provider, MD  nitroGLYCERIN (NITROSTAT) 0.4 MG SL tablet Place 1 tablet (0.4 mg total) under the tongue every 5 (five) minutes as needed for chest pain. 05/19/14   Daleen Bo, MD  traMADol (ULTRAM) 50 MG tablet Take 1-2 tablets by mouth daily as needed for arthritis    Historical Provider, MD  warfarin (COUMADIN) 5 MG tablet Take as directed by anticoagulation clinic. 05/29/14   Dorena Cookey, MD   Physical Exam: Filed Vitals:   02/17/15 2145 02/17/15 2148 02/17/15 2200 02/17/15 2215  BP: 164/70  161/65 172/64  Pulse:      Temp:  98.1 F (36.7 C)    TempSrc:  Oral    Resp: 20  18 16   Height:      Weight:      SpO2: 100%        General:  Awake, in nad  Eyes: PERRL B  ENT: membranes moist, dentition fair  Neck: trachea midline, neck supple  Cardiovascular: regular, s1, s2  Respiratory: normal resp effort, no wheezing  Abdomen: soft,nondistended  Skin: normal skin turgor  Musculoskeletal: perfused, no clubbing, necrotic 3rd right toe, purulent  Psychiatric: mood/affect normal//no auditory/visual hallucinations  Neurologic: cn2-12 grossly intact, strength/sensation intact  Labs on Admission:  Basic Metabolic Panel:  Recent Labs Lab 02/17/15 2000  NA 138  K 3.3*  CL 99  CO2 33*  GLUCOSE 91  BUN 8  CREATININE 0.87  CALCIUM 8.6   Liver Function Tests:  Recent Labs Lab 02/17/15 2000  AST 29  ALT 16  ALKPHOS 70  BILITOT 0.1*  PROT 6.3  ALBUMIN 3.7   No results for input(s): LIPASE, AMYLASE in the last 168 hours. No results for input(s): AMMONIA in the last 168  hours. CBC:  Recent Labs Lab 02/17/15 2000  WBC 6.1  NEUTROABS 4.0  HGB 10.0*  HCT 30.9*  MCV 93.9  PLT 253   Cardiac Enzymes: No results for input(s): CKTOTAL, CKMB, CKMBINDEX, TROPONINI in the last 168 hours.  BNP (last 3 results) No results for input(s): BNP in the last 8760 hours.  ProBNP (last 3 results)  Recent Labs  05/19/14 1537 07/10/14 1643  PROBNP 3398.0* 455.0*    CBG: No results for input(s): GLUCAP in the last 168 hours.  Radiological Exams on Admission: Dg Foot Complete Right  02/17/2015   CLINICAL DATA:  81 year old male with history of callus removal from the right middle toe 1 month ago  now presenting with redness, swelling, pain in drainage from the middle and fourth toes.  EXAM: RIGHT FOOT COMPLETE - 3+ VIEW  COMPARISON:  No priors.  FINDINGS: Osteolysis of the third distal phalangeal tuft. Overlying soft tissues appear markedly irregular. Remaining bones appear intact. No acute displaced fracture, subluxation or dislocation.  IMPRESSION: Osteolysis of the tuft of the third distal phalanx, concerning for osteomyelitis. Overlying soft tissues also appear markedly swollen, suggesting soft tissue infection as well.   Electronically Signed   By: Vinnie Langton M.D.   On: 02/17/2015 21:05    Assessment/Plan Principal Problem:   Osteomyelitis Active Problems:   HLD (hyperlipidemia)   Benign essential HTN   MYOCARDIAL INFARCTION, HX OF   Atrial fibrillation   COPD with asthma   Chronic systolic CHF (congestive heart failure), NYHA class 2   Chronic anticoagulation  1. Osteomyelitis of 3rd R toe 1. Orthopedic surgery consulted 2. MRI planned 3. Cont IV vanc for now per Ortho recs 4. Will likely need amputation. Will order ekg and cxr for pre-op clearance. Currently chest pain free and without sob. Vitals stable, lungs clear, and heart rate controlled at present. Blood work reviewed and is unremarkable. Would follow up above studies and if  unremarkable, pt would be medically clear for surgery. 2. HLD 1. Continue statin per home regimen 3. HTN 1. BP stable, albeit suboptimally controlled 2. Cont home meds 3. Will add PRN hydralazine 4. Hypokalemia 1. Will replace 5. Afib 1. Currently rate controlled 2. Cont home meds 3. Will hold coumadin as pt is being planned for surgery when INR is <1.5 4. Will cont on heparin gtt for now 6. Hx chronic systolic chf 1. Stable, clinically euvolemic 2. Last EF of around 50-55% in 2/16 7. Chronic anticoagulation 1. Coumadin on hold per above 2. Will cont on heparin gtt 8. DVT prophylaxis 1. Heparin gtt per above  Code Status: Full (must indicate code status--if unknown or must be presumed, indicate so) Family Communication: Pt in room (indicate person spoken with, if applicable, with phone number if by telephone) Disposition Plan: Admit to medsurg (indicate anticipated LOS)  Jenniffer Vessels, [place] [facility] Pager [phone]  If 7PM-7AM, please contact night-coverage [email or web address] Password [facility] 02/17/2015, 11:20 PM

## 2015-02-17 NOTE — ED Notes (Signed)
carelink here for pt, no changes, family at  [facility].

## 2015-02-17 NOTE — Progress Notes (Signed)
ANTIBIOTIC CONSULT NOTE - INITIAL  Pharmacy Consult for vancomycin Indication: osteomyelitis  Allergies  Allergen Reactions  . Levofloxacin Rash    "thought they were sticking swords thru me"  . Klor-Con [Potassium Chloride Er] Diarrhea    DIARRHEA    Patient Measurements: Height: 6' (182.9 cm) Weight: 170 lb (77.111 kg) IBW/kg (Calculated) : 77.6 Adjusted Body Weight:   Vital Signs: Temp: 98.7 F (37.1 C) (03/26 1924) Temp Source: Oral (03/26 1924) BP: 151/54 mmHg (03/26 2041) Pulse Rate: 58 (03/26 1924) Intake/Output from previous day:   Intake/Output from this shift:    Labs:  Recent Labs  02/17/15 2000  WBC 6.1  HGB 10.0*  PLT 253  CREATININE 0.87   Estimated Creatinine Clearance: 65.2 mL/min (by C-G formula based on Cr of 0.87). No results for input(s): VANCOTROUGH, VANCOPEAK, VANCORANDOM, GENTTROUGH, GENTPEAK, GENTRANDOM, TOBRATROUGH, TOBRAPEAK, TOBRARND, AMIKACINPEAK, AMIKACINTROU, AMIKACIN in the last 72 hours.   Microbiology: No results found for this or any previous visit (from the past 720 hour(s)).  Medical History: Past Medical History  Diagnosis Date  . Carotid artery occlusion     right total internal artery occlusion  . Coronary artery disease     s/p cardiac cath in 1990s, and cardiolite study  in 2005 showing EF 54%  . Hyperlipidemia   . Hypertension   . TIA (transient ischemic attack)   . Degenerative disc disease     cervical spine  . Arrhythmia   . Atrial fibrillation 03/2011  . Myocardial infarction 1992 and 1994  . Angina     "jaw & elbow was the worse"  . Pneumonia ~ 1935  . GERD (gastroesophageal reflux disease)   . Skin cancer     forehead  . Urgency incontinence   . Bilateral cellulitis of lower leg     Medications:  Scheduled:  Infusions:  Assessment: 81 yo male with osteomyelitis will be started on vancomycin.  SCr today is 0.87 (CrCl ~47 if adjust SCr to 1 based on age)  Goal of Therapy:  Vancomycin trough  level 15-20 mcg/ml  Plan:  - vancomycin 750 mg iv q12h - monitor renal function - check vancomycin trough when it's appropriate  Giavana Rooke, Tsz-Yin 02/17/2015,9:21 PM

## 2015-02-17 NOTE — ED Provider Notes (Signed)
CSN: [id number]     Arrival date & time 02/17/15  1915 History   First MD Initiated Contact with Patient 02/17/15 1928     Chief Complaint  Patient presents with  . Toe Pain     (Consider location/radiation/quality/duration/timing/severity/associated sxs/prior Treatment) HPI  [name] [name] is a 81 y.o. male complaining of nonhealing wound to right middle toe after patient had a callus removed by his podiatrist x4 weeks ago. States that he has no pain but it is severe pain upon any manipulation. It is red, swollen and there is purulent discharge. He is been soaking in Epsom salts intermittently, he did not follow-up with the podiatrist, no fever, chills or red streaking up the leg. No antibiotics Denies increasing peripheral edema, chest pain, shortness of breath, dyspnea on exertion, paroxysmal nocturnal dyspnea.  PCP Sherren Mocha  Past Medical History  Diagnosis Date  . Carotid artery occlusion     right total internal artery occlusion  . Coronary artery disease     s/p cardiac cath in 1990s, and cardiolite study  in 2005 showing EF 54%  . Hyperlipidemia   . Hypertension   . TIA (transient ischemic attack)   . Degenerative disc disease     cervical spine  . Arrhythmia   . Atrial fibrillation 03/2011  . Myocardial infarction 1992 and 1994  . Angina     "jaw & elbow was the worse"  . Pneumonia ~ 1935  . GERD (gastroesophageal reflux disease)   . Skin cancer     forehead  . Urgency incontinence   . Bilateral cellulitis of lower leg    Past Surgical History  Procedure Laterality Date  . Cataract extraction w/ intraocular lens  implant, bilateral    . Cardiac catheterization  1990s  . Skin cancer excision  12/2011    forehead   Family History  Problem Relation Age of Onset  . Heart disease Mother   . Malignant hyperthermia Mother   . Hypertension Mother   . Heart disease Father   . Malignant hyperthermia Father   . Hyperlipidemia Father   . Hypertension Father   . Heart  disease Sister   . Hypertension Sister   . Heart disease Brother    History  Substance Use Topics  . Smoking status: Former Smoker -- 1.00 packs/day for 40 years    Types: Cigarettes    Quit date: 09/12/1989  . Smokeless tobacco: Former Systems developer    Quit date: 11/26/1990     Comment: quit around 1992  . Alcohol Use: No     Comment: former, quit drinking in 1992    Review of Systems  10 systems reviewed and found to be negative, except as noted in the HPI.   Allergies  Levofloxacin and Klor-con  Home Medications   Prior to Admission medications   Medication Sig Start Date End Date Taking? Authorizing Provider  aspirin 81 MG tablet Take 81 mg by mouth at bedtime.     Historical Provider, MD  atorvastatin (LIPITOR) 10 MG tablet TAKE 1 TABLET AT BEDTIME 01/29/15   Dorothy Spark, MD  Calcium-Vitamin D (CALTRATE 600 PLUS-VIT D PO) Take 1 tablet by mouth at bedtime.     Historical Provider, MD  furosemide (LASIX) 40 MG tablet Take 1 tablet (40 mg total) by mouth daily. 07/10/14   Dorothy Spark, MD  HYDROcodone-homatropine [facility]) 5-1.5 MG/5ML syrup 1/2 - 1 teaspoon at bedtime as needed for cough Patient not taking: Reported on 01/22/2015 05/15/14  Dorena Cookey, MD  LANOXIN 125 MCG tablet TAKE 1 TABLET DAILY 11/06/14   Dorothy Spark, MD  levothyroxine (SYNTHROID, LEVOTHROID) 100 MCG tablet TAKE 1 TABLET DAILY 09/04/14   Dorena Cookey, MD  methotrexate (RHEUMATREX) 2.5 MG tablet Take 5-7.5 mg by mouth 2 (two) times a week. Take 3 tablets on Saturday and 2 tablets on sunday    Historical Provider, MD  metoprolol succinate (TOPROL-XL) 50 MG 24 hr tablet TAKE 1 TABLET TWICE A DAY 12/11/14   Dorothy Spark, MD  Multiple Vitamin (MULTIVITAMIN WITH MINERALS) TABS tablet Take 1 tablet by mouth daily.    Historical Provider, MD  nitroGLYCERIN (NITROSTAT) 0.4 MG SL tablet Place 1 tablet (0.4 mg total) under the tongue every 5 (five) minutes as needed for chest pain. 05/19/14   Daleen Bo, MD  traMADol (ULTRAM) 50 MG tablet Take 1-2 tablets by mouth daily as needed for arthritis    Historical Provider, MD  warfarin (COUMADIN) 5 MG tablet Take as directed by anticoagulation clinic. 05/29/14   Dorena Cookey, MD   BP 158/70 mmHg  Pulse 58  Temp(Src) 98.1 F (36.7 C) (Oral)  Resp 16  Ht 6' (1.829 m)  Wt 170 lb (77.111 kg)  BMI 23.05 kg/m2  SpO2 97% Physical Exam  Constitutional: He is oriented to person, place, and time. He appears well-developed and well-nourished. No distress.  HENT:  Head: Normocephalic.  Eyes: Conjunctivae and EOM are normal. Pupils are equal, round, and reactive to light.  Cardiovascular: Normal rate.   Irregular rhythm  Pulmonary/Chest: Effort normal and breath sounds normal. No stridor. No respiratory distress. He has no wheezes. He has no rales. He exhibits no tenderness.  Abdominal: Soft. Bowel sounds are normal. He exhibits no distension and no mass. There is no tenderness. There is no rebound and no guarding.  Musculoskeletal: Normal range of motion. He exhibits tenderness.  4+ pitting edema to proximal shin  Neurological: He is alert and oriented to person, place, and time.  Skin:  Swollen, macerated, tender right third toe with purulent, foul-smelling discharge.  Psychiatric: He has a normal mood and affect.  Nursing note and vitals reviewed.   ED Course  Procedures (including critical care time) Labs Review Labs Reviewed  SEDIMENTATION RATE - Abnormal; Notable for the following:    Sed Rate 23 (*)    All other components within normal limits  CBC WITH DIFFERENTIAL/PLATELET - Abnormal; Notable for the following:    RBC 3.29 (*)    Hemoglobin 10.0 (*)    HCT 30.9 (*)    RDW 18.6 (*)    Monocytes Relative 13 (*)    All other components within normal limits  BASIC METABOLIC PANEL - Abnormal; Notable for the following:    Potassium 3.3 (*)    CO2 33 (*)    GFR calc non Af Amer 75 (*)    GFR calc Af Amer 87 (*)    All other  components within normal limits  PROTIME-INR - Abnormal; Notable for the following:    Prothrombin Time 22.9 (*)    INR 2.02 (*)    All other components within normal limits  HEPATIC FUNCTION PANEL - Abnormal; Notable for the following:    Total Bilirubin 0.1 (*)    Indirect Bilirubin 0.0 (*)    All other components within normal limits  CULTURE, BLOOD (ROUTINE X 2)  CULTURE, BLOOD (ROUTINE X 2)    Imaging Review Dg Foot Complete Right  02/17/2015  CLINICAL DATA:  81 year old male with history of callus removal from the right middle toe 1 month ago now presenting with redness, swelling, pain in drainage from the middle and fourth toes.  EXAM: RIGHT FOOT COMPLETE - 3+ VIEW  COMPARISON:  No priors.  FINDINGS: Osteolysis of the third distal phalangeal tuft. Overlying soft tissues appear markedly irregular. Remaining bones appear intact. No acute displaced fracture, subluxation or dislocation.  IMPRESSION: Osteolysis of the tuft of the third distal phalanx, concerning for osteomyelitis. Overlying soft tissues also appear markedly swollen, suggesting soft tissue infection as well.   Electronically Signed   By: Vinnie Langton M.D.   On: 02/17/2015 21:05     EKG Interpretation None      MDM   Final diagnoses:  Osteomyelitis of toe of right foot   Filed Vitals:   02/17/15 2100 02/17/15 2115 02/17/15 2130 02/17/15 2148  BP: 151/83 143/61 158/70   Pulse:      Temp:    98.1 F (36.7 C)  TempSrc:    Oral  Resp: 15 18 16    Height:      Weight:      SpO2: 94% 100% 97%     Medications  vancomycin (VANCOCIN) IVPB 750 mg/150 ml premix (750 mg Intravenous Given 02/17/15 2142)  HYDROmorphone (DILAUDID) injection 1 mg (not administered)  ondansetron (ZOFRAN) injection 4 mg (not administered)    [name] is a pleasant 81 y.o. male presenting with macerated, foul-smelling purulent discharge from right third toe after patient had callus removal 1 month ago. Patient is afebrile,  overall well appearing, significant lower extremity edema. Plan basic blood work, x-ray, blood cultures are drawn. Patient refuses pain medication.  Patient has no leukocytosis, he is mildly hypokalemic at 3.3, CO2 is elevated at 33 he has a normal anion gap. X-rays consistent with osteomyelitis, vancomycin per pharmacy consult is ordered, sedimentation rate is elevated at 23.  Orthopedic consult from Dr. Erlinda Hong appreciated: He agrees with vancomycin, transfer to [facility], he will see the patient in the morning.  Case discussed with [facility] Dr. Wyline Copas who accepts admission to a MedSurg bed.       Monico Blitz, PA-C 02/17/15 2154  Quintella Reichert, MD 02/17/15 2219

## 2015-02-17 NOTE — Progress Notes (Addendum)
ANTICOAGULATION/ANTIBIOTIC CONSULT NOTE - Initial Consult  Pharmacy Consult for heparin Indication: atrial fibrillation  Allergies  Allergen Reactions  . Levofloxacin Rash    "thought they were sticking swords thru me"  . Klor-Con [Potassium Chloride Er] Diarrhea    DIARRHEA    Patient Measurements: Height: 6' (182.9 cm) Weight: 170 lb (77.111 kg) IBW/kg (Calculated) : 77.6  Vital Signs: Temp: 97.8 F (36.6 C) (03/26 2331) Temp Source: Oral (03/26 2331) BP: 155/70 mmHg (03/26 2331) Pulse Rate: 123 (03/26 2331)  Labs:  Recent Labs  02/15/15 02/17/15 2000  HGB  --  10.0*  HCT  --  30.9*  PLT  --  253  LABPROT  --  22.9*  INR 1.5 2.02*  CREATININE  --  0.87    Estimated Creatinine Clearance: 65.2 mL/min (by C-G formula based on Cr of 0.87).   Medical History: Past Medical History  Diagnosis Date  . Carotid artery occlusion     right total internal artery occlusion  . Coronary artery disease     s/p cardiac cath in 1990s, and cardiolite study  in 2005 showing EF 54%  . Hyperlipidemia   . Hypertension   . TIA (transient ischemic attack)   . Degenerative disc disease     cervical spine  . Arrhythmia   . Atrial fibrillation 03/2011  . Myocardial infarction 1992 and 1994  . Angina     "jaw & elbow was the worse"  . Pneumonia ~ 1935  . GERD (gastroesophageal reflux disease)   . Skin cancer     forehead  . Urgency incontinence   . Bilateral cellulitis of lower leg     Medications:  Prescriptions prior to admission  Medication Sig Dispense Refill Last Dose  . aspirin 81 MG tablet Take 81 mg by mouth at bedtime.    Taking  . atorvastatin (LIPITOR) 10 MG tablet TAKE 1 TABLET AT BEDTIME 90 tablet 1   . Calcium-Vitamin D (CALTRATE 600 PLUS-VIT D PO) Take 1 tablet by mouth at bedtime.    Taking  . furosemide (LASIX) 40 MG tablet Take 1 tablet (40 mg total) by mouth daily. 90 tablet 3 Taking  . HYDROcodone-homatropine (HYCODAN) 5-1.5 MG/5ML syrup 1/2 - 1  teaspoon at bedtime as needed for cough (Patient not taking: Reported on 01/22/2015) 120 mL 0 Not Taking  . LANOXIN 125 MCG tablet TAKE 1 TABLET DAILY 90 tablet 1 Taking  . levothyroxine (SYNTHROID, LEVOTHROID) 100 MCG tablet TAKE 1 TABLET DAILY 90 tablet 2 Taking  . methotrexate (RHEUMATREX) 2.5 MG tablet Take 5-7.5 mg by mouth 2 (two) times a week. Take 3 tablets on Saturday and 2 tablets on sunday   Taking  . metoprolol succinate (TOPROL-XL) 50 MG 24 hr tablet TAKE 1 TABLET TWICE A DAY 180 tablet 0 Taking  . Multiple Vitamin (MULTIVITAMIN WITH MINERALS) TABS tablet Take 1 tablet by mouth daily.   Taking  . nitroGLYCERIN (NITROSTAT) 0.4 MG SL tablet Place 1 tablet (0.4 mg total) under the tongue every 5 (five) minutes as needed for chest pain. 30 tablet 0 Taking  . traMADol (ULTRAM) 50 MG tablet Take 1-2 tablets by mouth daily as needed for arthritis   Taking  . warfarin (COUMADIN) 5 MG tablet Take as directed by anticoagulation clinic. 120 tablet 1 Taking   Scheduled:  . aspirin  81 mg Oral QHS  . atorvastatin  10 mg Oral QHS  . [START ON 02/18/2015] digoxin  125 mcg Oral Daily  . [START ON 02/18/2015]  docusate sodium  100 mg Oral BID  . [START ON 02/18/2015] furosemide  40 mg Oral Daily  . [START ON 02/18/2015] levothyroxine  100 mcg Oral Daily  . metoprolol succinate  50 mg Oral BID  . vancomycin  750 mg Intravenous Q12H    Assessment: 81yo male admitted for osteo s/p recent callous removal, ortho consulted for suspected requirement of amputation, Coumadin on hold for OR when INR <1.5, to begin heparin bridge; current INR 2.  Goal of Therapy:  Heparin level 0.3-0.7 units/ml Monitor platelets by anticoagulation protocol: Yes   Plan:  Will begin heparin gtt at 1000 units/hr and monitor heparin levels and CBC.  Wynona Neat, PharmD, BCPS  02/17/2015,11:43 PM  02/18/2015 9:11 AM Pharmacy consulted to add zosyn.  Zosyn 3.375g IV q8h infuse over 4h Follow up SCr, UOP, cultures, clinical  course and adjust as clinically indicated. Rechy Bost C

## 2015-02-17 NOTE — ED Notes (Signed)
Pt inh xray

## 2015-02-18 ENCOUNTER — Encounter (HOSPITAL_COMMUNITY)
Admission: EM | Disposition: A | Discharge status: Skilled Nursing Facility | Payer: Self-pay | Source: Home / Self Care | Attending: Internal Medicine

## 2015-02-18 ENCOUNTER — Inpatient Hospital Stay (HOSPITAL_COMMUNITY): Payer: Medicare Other

## 2015-02-18 ENCOUNTER — Encounter (HOSPITAL_COMMUNITY): Payer: Self-pay | Admitting: Anesthesiology

## 2015-02-18 DIAGNOSIS — I482 Chronic atrial fibrillation: Secondary | ICD-10-CM

## 2015-02-18 DIAGNOSIS — E039 Hypothyroidism, unspecified: Secondary | ICD-10-CM

## 2015-02-18 LAB — COMPREHENSIVE METABOLIC PANEL
ALK PHOS: 62 U/L (ref 39–117)
ALT: 14 U/L (ref 0–53)
ANION GAP: 8 (ref 5–15)
AST: 27 U/L (ref 0–37)
Albumin: 2.8 g/dL — ABNORMAL LOW (ref 3.5–5.2)
BILIRUBIN TOTAL: 0.5 mg/dL (ref 0.3–1.2)
BUN: <5 mg/dL — ABNORMAL LOW (ref 6–23)
CALCIUM: 8.3 mg/dL — AB (ref 8.4–10.5)
CO2: 30 mmol/L (ref 19–32)
CREATININE: 0.8 mg/dL (ref 0.50–1.35)
Chloride: 100 mmol/L (ref 96–112)
GFR calc Af Amer: >90 mL/min (ref >90)
GFR, EST NON AFRICAN AMERICAN: 78 mL/min — AB (ref >90)
Glucose, Bld: 105 mg/dL — ABNORMAL HIGH (ref 70–99)
Potassium: 3.7 mmol/L (ref 3.5–5.1)
Sodium: 138 mmol/L (ref 135–145)
Total Protein: 5.3 g/dL — ABNORMAL LOW (ref 6.0–8.3)

## 2015-02-18 LAB — PROTIME-INR
INR: 2.1 — ABNORMAL HIGH (ref 0.00–1.49)
Prothrombin Time: 23.7 seconds — ABNORMAL HIGH (ref 11.6–15.2)

## 2015-02-18 LAB — CBC
HCT: 29.7 % — ABNORMAL LOW (ref 39.0–52.0)
HEMATOCRIT: 27.9 % — AB (ref 39.0–52.0)
HEMOGLOBIN: 9.2 g/dL — AB (ref 13.0–17.0)
Hemoglobin: 9.6 g/dL — ABNORMAL LOW (ref 13.0–17.0)
MCH: 30.5 pg (ref 26.0–34.0)
MCH: 29.8 pg (ref 26.0–34.0)
MCHC: 33 g/dL (ref 30.0–36.0)
MCHC: 32.3 g/dL (ref 30.0–36.0)
MCV: 92.4 fL (ref 78.0–100.0)
MCV: 92.2 fL (ref 78.0–100.0)
PLATELETS: 213 10*3/uL (ref 150–400)
Platelets: 240 10*3/uL (ref 150–400)
RBC: 3.02 MIL/uL — ABNORMAL LOW (ref 4.22–5.81)
RBC: 3.22 MIL/uL — ABNORMAL LOW (ref 4.22–5.81)
RDW: 19 % — AB (ref 11.5–15.5)
RDW: 19.1 % — ABNORMAL HIGH (ref 11.5–15.5)
WBC: 5.8 10*3/uL (ref 4.0–10.5)
WBC: 6 10*3/uL (ref 4.0–10.5)

## 2015-02-18 LAB — DIGOXIN LEVEL: DIGOXIN LVL: 0.5 ng/mL — AB (ref 0.8–2.0)

## 2015-02-18 LAB — HEPARIN LEVEL (UNFRACTIONATED): Heparin Unfractionated: <0.1 IU/mL — ABNORMAL LOW (ref 0.30–0.70)

## 2015-02-18 SURGERY — AMPUTATION, FOOT, PARTIAL
Anesthesia: General | Laterality: Right

## 2015-02-18 MED ORDER — EPHEDRINE SULFATE 50 MG/ML IJ SOLN
INTRAMUSCULAR | Status: AC
  Filled 2015-02-18: qty 1

## 2015-02-18 MED ORDER — FENTANYL CITRATE 0.05 MG/ML IJ SOLN
INTRAMUSCULAR | Status: AC
  Filled 2015-02-18: qty 5

## 2015-02-18 MED ORDER — ROCURONIUM BROMIDE 50 MG/5ML IV SOLN
INTRAVENOUS | Status: AC
  Filled 2015-02-18: qty 1

## 2015-02-18 MED ORDER — PHENYLEPHRINE 40 MCG/ML (10ML) SYRINGE FOR IV PUSH (FOR BLOOD PRESSURE SUPPORT)
PREFILLED_SYRINGE | INTRAVENOUS | Status: AC
  Filled 2015-02-18: qty 10

## 2015-02-18 MED ORDER — PIPERACILLIN-TAZOBACTAM 3.375 G IVPB
3.3750 g | Freq: Three times a day (TID) | INTRAVENOUS | Status: DC
  Administered 2015-02-18 – 2015-02-23 (×16): 3.375 g via INTRAVENOUS
  Filled 2015-02-18 (×18): qty 50

## 2015-02-18 MED ORDER — MIDAZOLAM HCL 2 MG/2ML IJ SOLN
INTRAMUSCULAR | Status: AC
  Filled 2015-02-18: qty 2

## 2015-02-18 MED ORDER — LIDOCAINE HCL (CARDIAC) 20 MG/ML IV SOLN
INTRAVENOUS | Status: AC
  Filled 2015-02-18: qty 5

## 2015-02-18 MED ORDER — PROPOFOL 10 MG/ML IV BOLUS
INTRAVENOUS | Status: AC
  Filled 2015-02-18: qty 20

## 2015-02-18 MED ORDER — ONDANSETRON HCL 4 MG/2ML IJ SOLN
INTRAMUSCULAR | Status: AC
  Filled 2015-02-18: qty 2

## 2015-02-18 MED ORDER — GADOBENATE DIMEGLUMINE 529 MG/ML IV SOLN
15.0000 mL | Freq: Once | INTRAVENOUS | Status: AC | PRN
  Administered 2015-02-18: 15 mL via INTRAVENOUS

## 2015-02-18 MED ORDER — POTASSIUM CHLORIDE 10 MEQ/100ML IV SOLN
10.0000 meq | INTRAVENOUS | Status: AC
  Administered 2015-02-18 (×4): 10 meq via INTRAVENOUS
  Filled 2015-02-18 (×4): qty 100

## 2015-02-18 MED ORDER — SODIUM CHLORIDE 0.9 % IJ SOLN
INTRAMUSCULAR | Status: AC
  Filled 2015-02-18: qty 10

## 2015-02-18 MED ORDER — POTASSIUM CHLORIDE CRYS ER 20 MEQ PO TBCR: 40.0000 meq | EXTENDED_RELEASE_TABLET | Freq: Once | ORAL | Status: DC

## 2015-02-18 SURGICAL SUPPLY — 40 items
BLADE AVERAGE 25MMX9MM (BLADE)
BLADE AVERAGE 25X9 (BLADE) IMPLANT
BLADE LONG MED 31MMX9MM (MISCELLANEOUS) ×1
BLADE LONG MED 31X9 (MISCELLANEOUS) ×2 IMPLANT
BNDG COHESIVE 4X5 TAN STRL (GAUZE/BANDAGES/DRESSINGS) IMPLANT
BNDG CONFORM 2 STRL LF (GAUZE/BANDAGES/DRESSINGS) ×3 IMPLANT
BNDG CONFORM 3 STRL LF (GAUZE/BANDAGES/DRESSINGS) IMPLANT
BNDG ESMARK 4X9 LF (GAUZE/BANDAGES/DRESSINGS) ×3 IMPLANT
CANISTER SUCTION 2500CC (MISCELLANEOUS) ×3 IMPLANT
CORDS BIPOLAR (ELECTRODE) IMPLANT
COVER SURGICAL LIGHT HANDLE (MISCELLANEOUS) ×3 IMPLANT
CUFF TOURNIQUET SINGLE 18IN (TOURNIQUET CUFF) IMPLANT
CUFF TOURNIQUET SINGLE 24IN (TOURNIQUET CUFF) IMPLANT
CUFF TOURNIQUET SINGLE 34IN LL (TOURNIQUET CUFF) IMPLANT
DRAPE SURG 17X23 STRL (DRAPES) IMPLANT
DRAPE U-SHAPE 47X51 STRL (DRAPES) IMPLANT
DURAPREP 26ML APPLICATOR (WOUND CARE) ×3 IMPLANT
ELECT CAUTERY BLADE 6.4 (BLADE) ×3 IMPLANT
ELECT REM PT RETURN 9FT ADLT (ELECTROSURGICAL) ×3
ELECTRODE REM PT RTRN 9FT ADLT (ELECTROSURGICAL) ×1 IMPLANT
FACESHIELD WRAPAROUND (MASK) ×3 IMPLANT
GAUZE SPONGE 4X4 12PLY STRL (GAUZE/BANDAGES/DRESSINGS) IMPLANT
GAUZE XEROFORM 1X8 LF (GAUZE/BANDAGES/DRESSINGS) IMPLANT
GLOVE NEODERM STRL 7.5 LF PF (GLOVE) ×1 IMPLANT
GLOVE SURG NEODERM 7.5  LF PF (GLOVE) ×2
GOWN STRL REIN XL XLG (GOWN DISPOSABLE) ×6 IMPLANT
KIT BASIN OR (CUSTOM PROCEDURE TRAY) ×3 IMPLANT
KIT ROOM TURNOVER OR (KITS) ×3 IMPLANT
NEEDLE HYPO 25GX1X1/2 BEV (NEEDLE) IMPLANT
NS IRRIG 1000ML POUR BTL (IV SOLUTION) ×3 IMPLANT
PACK ORTHO EXTREMITY (CUSTOM PROCEDURE TRAY) ×3 IMPLANT
PAD ARMBOARD 7.5X6 YLW CONV (MISCELLANEOUS) ×6 IMPLANT
SPECIMEN JAR SMALL (MISCELLANEOUS) ×3 IMPLANT
SUT ETHILON 2 0 FS 18 (SUTURE) IMPLANT
SYR CONTROL 10ML LL (SYRINGE) IMPLANT
TOWEL OR 17X24 6PK STRL BLUE (TOWEL DISPOSABLE) ×3 IMPLANT
TOWEL OR 17X26 10 PK STRL BLUE (TOWEL DISPOSABLE) ×3 IMPLANT
TUBE CONNECTING 12'X1/4 (SUCTIONS)
TUBE CONNECTING 12X1/4 (SUCTIONS) IMPLANT
WATER STERILE IRR 1000ML POUR (IV SOLUTION) ×3 IMPLANT

## 2015-02-18 NOTE — Progress Notes (Signed)
[facility] PROGRESS NOTE  [name] [name] ELF:[id number] DOB: 27-Feb-1925 DOA: 02/17/2015  PCP: [name], MD  Brief HPI: 81 year old Caucasian male with a past medical history of atrial fibrillation on Coumadin, history of TIA, coronary artery disease who presented with worsening redness, pain and swelling of the right third toe, status post recent callus removal by podiatry. Examination was concerning for gangrenous changes involving that toe. Patient was admitted to the hospital for further management.  Past medical history:  Past Medical History  Diagnosis Date  . Carotid artery occlusion     right total internal artery occlusion  . Coronary artery disease     s/p cardiac cath in 1990s, and cardiolite study  in 2005 showing EF 54%  . Hyperlipidemia   . Hypertension   . TIA (transient ischemic attack)   . Degenerative disc disease     cervical spine  . Arrhythmia   . Atrial fibrillation 03/2011  . Myocardial infarction 1992 and 1994  . Angina     "jaw & elbow was the worse"  . Pneumonia ~ 1935  . GERD (gastroesophageal reflux disease)   . Skin cancer     forehead  . Urgency incontinence   . Bilateral cellulitis of lower leg     Consultants: Orthopedics  Procedures: None yet  Antibiotics: Intravenous vancomycin Zosyn  Subjective: Patient denies any significant pain in the toe. Right now. Denies any chest pain, shortness of breath, nausea, vomiting.  Objective: Vital Signs  Filed Vitals:   02/17/15 2200 02/17/15 2215 02/17/15 2331 02/18/15 0638  BP: 161/65 172/64 155/70 142/65  Pulse:   123 69  Temp:   97.8 F (36.6 C) 97.9 F (36.6 C)  TempSrc:   Oral Oral  Resp: 18 16 16 16   Height:   6' (1.829 m)   Weight:   77.111 kg (170 lb)   SpO2:   98% 95%    Intake/Output Summary (Last 24 hours) at 02/18/15 5852 Last data filed at 02/18/15 0500  Gross per 24 hour  Intake      0 ml  Output   1000 ml  Net  -1000 ml   Filed Weights   02/17/15 1924 02/17/15 2331  Weight: 77.111 kg (170 lb) 77.111 kg (170 lb)    General appearance: alert, cooperative, appears stated age and no distress Resp: Decreased air entry at the bases without any definite crackles or wheezing. No rhonchi. Cardio: S1, S2 is irregularly irregular. No S3, S4. No rubs, murmurs, or bruit. No significant pedal edema. GI: soft, non-tender; bowel sounds normal; no masses,  no organomegaly Extremities: Chronic venous stasis noted in both the legs. Right third toe has an open wound at the tip has necrotic changes. It's foul-smelling. Good dorsalis pedis pulses. Neurologic: No focal deficits  Lab Results:  Basic Metabolic Panel:  Recent Labs Lab 02/17/15 2000 02/18/15 0421  NA 138 138  K 3.3* 3.7  CL 99 100  CO2 33* 30  GLUCOSE 91 105*  BUN 8 <5*  CREATININE 0.87 0.80  CALCIUM 8.6 8.3*   Liver Function Tests:  Recent Labs Lab 02/17/15 2000 02/18/15 0421  AST 29 27  ALT 16 14  ALKPHOS 70 62  BILITOT 0.1* 0.5  PROT 6.3 5.3*  ALBUMIN 3.7 2.8*   CBC:  Recent Labs Lab 02/17/15 2000 02/18/15 0421 02/18/15 0809  WBC 6.1 5.8 6.0  NEUTROABS 4.0  --   --   HGB 10.0* 9.2* 9.6*  HCT 30.9* 27.9* 29.7*  MCV 93.9 92.4 92.2  PLT 253 213 240    ProBNP (last 3 results)  Recent Labs  05/19/14 1537 07/10/14 1643  PROBNP 3398.0* 455.0*    Studies/Results: Dg Chest 1 View  02/18/2015   CLINICAL DATA:  Congestive heart failure  EXAM: CHEST  1 VIEW  COMPARISON:  05/19/2014  FINDINGS: Cardiomegaly with central vascular congestion noted and a few peripheral interstitial Kerley B-lines. Trace, if any, pleural effusions. No focal pulmonary opacity. No acute osseous abnormality.  IMPRESSION: Cardiomegaly with minimal interstitial pulmonary edema.   Electronically Signed   By: Conchita Paris M.D.   On: 02/18/2015 08:49   Dg Foot Complete Right  02/17/2015   CLINICAL DATA:  81 year old male with history of callus removal from the right middle toe  1 month ago now presenting with redness, swelling, pain in drainage from the middle and fourth toes.  EXAM: RIGHT FOOT COMPLETE - 3+ VIEW  COMPARISON:  No priors.  FINDINGS: Osteolysis of the third distal phalangeal tuft. Overlying soft tissues appear markedly irregular. Remaining bones appear intact. No acute displaced fracture, subluxation or dislocation.  IMPRESSION: Osteolysis of the tuft of the third distal phalanx, concerning for osteomyelitis. Overlying soft tissues also appear markedly swollen, suggesting soft tissue infection as well.   Electronically Signed   By: Vinnie Langton M.D.   On: 02/17/2015 21:05    Medications:  Scheduled: . aspirin  81 mg Oral QHS  . atorvastatin  10 mg Oral QHS  . digoxin  125 mcg Oral Daily  . docusate sodium  100 mg Oral BID  . furosemide  40 mg Oral Daily  . levothyroxine  100 mcg Oral QAC breakfast  . metoprolol succinate  50 mg Oral BID  . piperacillin-tazobactam (ZOSYN)  IV  3.375 g Intravenous 3 times per day  . vancomycin  750 mg Intravenous Q12H   Continuous:  YWV:[id number] **OR** acetaminophen, morphine injection, ondansetron **OR** ondansetron (ZOFRAN) IV  Assessment/Plan:  Principal Problem:   Osteomyelitis Active Problems:   HLD (hyperlipidemia)   Benign essential HTN   MYOCARDIAL INFARCTION, HX OF   Atrial fibrillation   COPD with asthma   Chronic systolic CHF (congestive heart failure), NYHA class 2   Chronic anticoagulation    Osteomyelitis of 3rd R toe Orthopedics is following. MRI report is pending. Vancomycin was started yesterday. We'll add Zosyn due to gangrenous changes. Will likely require amputation. EKG is pending. Chest x-ray report reviewed. No clinical evidence for edema. Patient had an echocardiogram recently which showed normal systolic function without any wall motion abnormalities. If EKG doesn't show any new changes compared to before he should be able to proceed to surgery. He does have reasonably good  functional capacity.  Chronic Afib Currently rate controlled. Continue home medications. Coumadin on hold for upcoming surgery. His Chads score is 3 or 4 depending on whether or not he has hypertension, which is not very clear. There is no clear indication for preoperative bridging with heparin. We can stop the IV heparin for now. Allow the INR to come down. Once surgery is done Warfarin can be reinitiated. His TIA was many years ago. No recent event. Noted to be on digoxin. We will check a dig Level.  HLD Continue statin per home regimen  Hypokalemia Repleted  Hx chronic systolic chf Last known EF was normal. He is clinically euvolemic. Continue oral furosemide.  Normocytic anemia. Hemoglobin close to baseline. Continue to monitor.  History of hypothyroidism. Continue with his levothyroxine.  DVT Prophylaxis:  Elevated INR    Code Status: Full code  Family Communication: Discussed with the patient  Disposition Plan: Not ready for discharge. Await further orthopedic input.    LOS: 1 day   [facility] Pager [phone] 02/18/2015, 8:52 AM  If 7PM-7AM, please contact night-coverage at [email or web address], password [facility]

## 2015-02-18 NOTE — Progress Notes (Signed)
Utilization review completed.  

## 2015-02-18 NOTE — Consult Note (Signed)
ORTHOPAEDIC CONSULTATION  REQUESTING PHYSICIAN: Bonnielee Haff, MD  Chief Complaint: right 3rd toe wound  HPI: [name] [name] is a 81 y.o. male who complains of right 3rd toe wound s/p callus debridement by his podiatrist 4 weeks ago.  Presented to ER with increased pain, swelling, redness.  Denies f/c/n/v.  Denies DM. Ortho consulted for wound.   Past Medical History  Diagnosis Date  . Carotid artery occlusion     right total internal artery occlusion  . Coronary artery disease     s/p cardiac cath in 1990s, and cardiolite study  in 2005 showing EF 54%  . Hyperlipidemia   . Hypertension   . TIA (transient ischemic attack)   . Degenerative disc disease     cervical spine  . Arrhythmia   . Atrial fibrillation 03/2011  . Myocardial infarction 1992 and 1994  . Angina     "jaw & elbow was the worse"  . Pneumonia ~ 1935  . GERD (gastroesophageal reflux disease)   . Skin cancer     forehead  . Urgency incontinence   . Bilateral cellulitis of lower leg    Past Surgical History  Procedure Laterality Date  . Cataract extraction w/ intraocular lens  implant, bilateral    . Cardiac catheterization  1990s  . Skin cancer excision  12/2011    forehead   History   Social History  . Marital Status: Married    Spouse Name: N/A  . Number of Children: N/A  . Years of Education: N/A   Social History Main Topics  . Smoking status: Former Smoker -- 1.00 packs/day for 40 years    Types: Cigarettes    Quit date: 09/12/1989  . Smokeless tobacco: Former Systems developer    Quit date: 11/26/1990     Comment: quit around 1992  . Alcohol Use: No     Comment: former, quit drinking in 1992  . Drug Use: No  . Sexual Activity: Not Currently   Other Topics Concern  . 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 Heart disease Mother   . Malignant hyperthermia Mother   . Hypertension Mother   . Heart disease Father   . Malignant hyperthermia Father   . Hyperlipidemia Father   . Hypertension Father   . Heart disease Sister   . Hypertension Sister   . Heart disease Brother    Allergies  Allergen Reactions  . Levofloxacin Rash    "thought they were sticking swords thru me"  . Klor-Con [Potassium Chloride Er] Diarrhea    DIARRHEA   Prior to Admission medications   Medication Sig Start Date End Date Taking? Authorizing Provider  aspirin 81 MG tablet Take 81 mg by mouth at bedtime.     Historical Provider, MD  atorvastatin (LIPITOR) 10 MG tablet TAKE 1 TABLET AT BEDTIME 01/29/15   Dorothy Spark, MD  Calcium-Vitamin D (CALTRATE 600 PLUS-VIT D PO) Take 1 tablet by mouth at bedtime.     Historical Provider, MD  furosemide (LASIX) 40 MG tablet Take 1 tablet (40 mg total) by mouth daily. 07/10/14   Dorothy Spark, MD  HYDROcodone-homatropine [facility]) 5-1.5 MG/5ML syrup 1/2 - 1 teaspoon at bedtime as needed for cough Patient not taking: Reported  on 01/22/2015 05/15/14   Dorena Cookey, MD  LANOXIN 125 MCG tablet TAKE 1 TABLET DAILY 11/06/14   Dorothy Spark, MD  levothyroxine (SYNTHROID, LEVOTHROID) 100 MCG tablet TAKE 1 TABLET DAILY 09/04/14   Dorena Cookey, MD  methotrexate (RHEUMATREX) 2.5 MG tablet Take 5-7.5 mg by mouth 2 (two) times a week. Take 3 tablets on Saturday and 2 tablets on sunday    Historical Provider, MD  metoprolol succinate (TOPROL-XL) 50 MG 24 hr tablet TAKE 1 TABLET TWICE A DAY 12/11/14   Dorothy Spark, MD  Multiple Vitamin (MULTIVITAMIN WITH MINERALS) TABS tablet Take 1 tablet by mouth daily.    Historical Provider, MD  nitroGLYCERIN (NITROSTAT) 0.4 MG SL tablet Place 1 tablet (0.4 mg total) under the tongue every 5 (five) minutes as needed for chest pain. 05/19/14   Daleen Bo, MD  traMADol (ULTRAM) 50 MG tablet Take 1-2 tablets by mouth daily as needed for  arthritis    Historical Provider, MD  warfarin (COUMADIN) 5 MG tablet Take as directed by anticoagulation clinic. 05/29/14   Dorena Cookey, MD   Dg Foot Complete Right  02/17/2015   CLINICAL DATA:  81 year old male with history of callus removal from the right middle toe 1 month ago now presenting with redness, swelling, pain in drainage from the middle and fourth toes.  EXAM: RIGHT FOOT COMPLETE - 3+ VIEW  COMPARISON:  No priors.  FINDINGS: Osteolysis of the third distal phalangeal tuft. Overlying soft tissues appear markedly irregular. Remaining bones appear intact. No acute displaced fracture, subluxation or dislocation.  IMPRESSION: Osteolysis of the tuft of the third distal phalanx, concerning for osteomyelitis. Overlying soft tissues also appear markedly swollen, suggesting soft tissue infection as well.   Electronically Signed   By: Vinnie Langton M.D.   On: 02/17/2015 21:05    Positive ROS: All other systems have been reviewed and were otherwise negative with the exception of those mentioned in the HPI and as above.  Physical Exam: General: Alert, no acute distress Cardiovascular: No pedal edema Respiratory: No cyanosis, no use of accessory musculature GI: No organomegaly, abdomen is soft and non-tender Skin: No lesions in the area of chief complaint Neurologic: Sensation intact distally Psychiatric: Patient is competent for consent with normal mood and affect Lymphatic: No axillary or cervical lymphadenopathy  MUSCULOSKELETAL:  - 2+ DP pulse - severe swelling of BLE with skin changes c/w lymphedema - scant purulent drainage from toe - sensation intact  Assessment: Right 3rd toe wound  Plan: - MRI read pending - patient is nontoxic appearing - continue IV abx - does not need surgery urgently - will likely discuss with Dr. Sharol Given about case  Thank you for the consult and the opportunity to see [name] [name]. Eduard Roux, MD [place] 7:07  AM

## 2015-02-19 ENCOUNTER — Other Ambulatory Visit: Payer: Self-pay | Admitting: Cardiology

## 2015-02-19 LAB — CBC
HEMATOCRIT: 28.9 % — AB (ref 39.0–52.0)
HEMOGLOBIN: 9.4 g/dL — AB (ref 13.0–17.0)
MCH: 29.9 pg (ref 26.0–34.0)
MCHC: 32.5 g/dL (ref 30.0–36.0)
MCV: 92 fL (ref 78.0–100.0)
Platelets: 215 10*3/uL (ref 150–400)
RBC: 3.14 MIL/uL — AB (ref 4.22–5.81)
RDW: 19.3 % — ABNORMAL HIGH (ref 11.5–15.5)
WBC: 13.5 10*3/uL — ABNORMAL HIGH (ref 4.0–10.5)

## 2015-02-19 LAB — BASIC METABOLIC PANEL
ANION GAP: 3 — AB (ref 5–15)
BUN: <5 mg/dL — ABNORMAL LOW (ref 6–23)
CALCIUM: 8.2 mg/dL — AB (ref 8.4–10.5)
CO2: 28 mmol/L (ref 19–32)
Chloride: 105 mmol/L (ref 96–112)
Creatinine, Ser: 0.82 mg/dL (ref 0.50–1.35)
GFR calc Af Amer: 90 mL/min — ABNORMAL LOW (ref >90)
GFR, EST NON AFRICAN AMERICAN: 77 mL/min — AB (ref >90)
Glucose, Bld: 98 mg/dL (ref 70–99)
POTASSIUM: 3.5 mmol/L (ref 3.5–5.1)
Sodium: 136 mmol/L (ref 135–145)

## 2015-02-19 LAB — PROTIME-INR
INR: 1.66 — AB (ref 0.00–1.49)
PROTHROMBIN TIME: 19.7 s — AB (ref 11.6–15.2)

## 2015-02-19 MED ORDER — WARFARIN SODIUM 5 MG PO TABS
5.0000 mg | ORAL_TABLET | Freq: Once | ORAL | Status: AC
  Administered 2015-02-19: 5 mg via ORAL
  Filled 2015-02-19: qty 1

## 2015-02-19 MED ORDER — WARFARIN - PHARMACIST DOSING INPATIENT: Freq: Every day | Status: DC

## 2015-02-19 MED ORDER — POTASSIUM CHLORIDE CRYS ER 20 MEQ PO TBCR
40.0000 meq | EXTENDED_RELEASE_TABLET | Freq: Once | ORAL | Status: AC
  Administered 2015-02-19: 40 meq via ORAL
  Filled 2015-02-19: qty 2

## 2015-02-19 NOTE — Progress Notes (Signed)
ANTICOAGULATION CONSULT NOTE - Follow Up Consult  Pharmacy Consult for coumadin Indication: atrial fibrillation  Allergies  Allergen Reactions  . Levofloxacin Rash    "thought they were sticking swords thru me"  . Klor-Con [Potassium Chloride Er] Diarrhea    DIARRHEA    Patient Measurements: Height: 6' (182.9 cm) Weight: 170 lb (77.111 kg) IBW/kg (Calculated) : 77.6 Heparin Dosing Weight:   Vital Signs: Temp: 98.4 F (36.9 C) (03/28 0700) Temp Source: Oral (03/28 0700) BP: 146/56 mmHg (03/28 0700) Pulse Rate: 65 (03/28 0700)  Labs:  Recent Labs  02/17/15 2000 02/18/15 0421 02/18/15 0809 02/19/15 0547  HGB 10.0* 9.2* 9.6* 9.4*  HCT 30.9* 27.9* 29.7* 28.9*  PLT 253 213 240 215  LABPROT 22.9* 23.7*  --  19.7*  INR 2.02* 2.10*  --  1.66*  HEPARINUNFRC  --   --  <0.10*  --   CREATININE 0.87 0.80  --  0.82    Estimated Creatinine Clearance: 69.2 mL/min (by C-G formula based on Cr of 0.82).   Medications:  Scheduled:  . aspirin  81 mg Oral QHS  . atorvastatin  10 mg Oral QHS  . digoxin  125 mcg Oral Daily  . docusate sodium  100 mg Oral BID  . furosemide  40 mg Oral Daily  . levothyroxine  100 mcg Oral QAC breakfast  . metoprolol succinate  50 mg Oral BID  . piperacillin-tazobactam (ZOSYN)  IV  3.375 g Intravenous 3 times per day  . potassium chloride  40 mEq Oral Once  . vancomycin  750 mg Intravenous Q12H   Infusions:    Assessment: 81 yo male with hx of afib will be resumed on coumadin.  INR today is down to 1.66 because he has been off on coumadin for few days.  Per primary team, they don't want to bridge with heparin.  Also, ortho said ok to be therapeutic on coumadin to do surgery.  Goal of Therapy:  INR 2-3 Monitor platelets by anticoagulation protocol: Yes   Plan:  - coumadin 5 mg po x1 - INR in am  Pawel Soules, Tsz-Yin 02/19/2015,1:22 PM

## 2015-02-19 NOTE — Progress Notes (Signed)
[facility] PROGRESS NOTE  TAGG EUSTICE ERX:[id number] DOB: 1925/10/13 DOA: 02/17/2015  PCP: [name], MD  Brief HPI: 81 year old Caucasian male with a past medical history of atrial fibrillation on Coumadin, history of TIA, coronary artery disease who presented with worsening redness, pain and swelling of the right third toe, status post recent callus removal by podiatry. Examination was concerning for gangrenous changes involving that toe. Patient was admitted to the hospital for further management.  Past medical history:  Past Medical History  Diagnosis Date  . Carotid artery occlusion     right total internal artery occlusion  . Coronary artery disease     s/p cardiac cath in 1990s, and cardiolite study  in 2005 showing EF 54%  . Hyperlipidemia   . Hypertension   . TIA (transient ischemic attack)   . Degenerative disc disease     cervical spine  . Arrhythmia   . Atrial fibrillation 03/2011  . Myocardial infarction 1992 and 1994  . Angina     "jaw & elbow was the worse"  . Pneumonia ~ 1935  . GERD (gastroesophageal reflux disease)   . Skin cancer     forehead  . Urgency incontinence   . Bilateral cellulitis of lower leg     Consultants: Orthopedics  Procedures: None yet  Antibiotics: Vancomycin 3/26 Zosyn 3/27  Subjective: Patient feels well. Denies any complaints.   Objective: Vital Signs  Filed Vitals:   02/18/15 0638 02/18/15 1455 02/18/15 2017 02/19/15 0700  BP: 142/65 123/55 137/58 146/56  Pulse: 69 84 63 65  Temp: 97.9 F (36.6 C) 97.9 F (36.6 C) 98.5 F (36.9 C) 98.4 F (36.9 C)  TempSrc: Oral Oral Oral Oral  Resp: 16 17    Height:      Weight:      SpO2: 95% 94% 96% 94%    Intake/Output Summary (Last 24 hours) at 02/19/15 9509 Last data filed at 02/19/15 3267  Gross per 24 hour  Intake    240 ml  Output    300 ml  Net    -60 ml   Filed Weights   02/17/15 1924 02/17/15 2331  Weight: 77.111 kg (170 lb) 77.111 kg  (170 lb)    General appearance: alert, cooperative, appears stated age and no distress Resp: Decreased air entry at the bases without any definite crackles or wheezing. No rhonchi. Cardio: S1, S2 is irregularly irregular. No S3, S4. No rubs, murmurs, or bruit. No significant pedal edema. GI: soft, non-tender; bowel sounds normal; no masses,  no organomegaly Extremities: Chronic venous stasis noted in both the legs. Right third toe has an open wound at the tip has necrotic changes. It's foul-smelling. Some bleeding is noted. Good dorsalis pedis pulses. Neurologic: No focal deficits  Lab Results:  Basic Metabolic Panel:  Recent Labs Lab 02/17/15 2000 02/18/15 0421 02/19/15 0547  NA 138 138 136  K 3.3* 3.7 3.5  CL 99 100 105  CO2 33* 30 28  GLUCOSE 91 105* 98  BUN 8 <5* <5*  CREATININE 0.87 0.80 0.82  CALCIUM 8.6 8.3* 8.2*   Liver Function Tests:  Recent Labs Lab 02/17/15 2000 02/18/15 0421  AST 29 27  ALT 16 14  ALKPHOS 70 62  BILITOT 0.1* 0.5  PROT 6.3 5.3*  ALBUMIN 3.7 2.8*   CBC:  Recent Labs Lab 02/17/15 2000 02/18/15 0421 02/18/15 0809 02/19/15 0547  WBC 6.1 5.8 6.0 13.5*  NEUTROABS 4.0  --   --   --  HGB 10.0* 9.2* 9.6* 9.4*  HCT 30.9* 27.9* 29.7* 28.9*  MCV 93.9 92.4 92.2 92.0  PLT 253 213 240 215    ProBNP (last 3 results)  Recent Labs  05/19/14 1537 07/10/14 1643  PROBNP 3398.0* 455.0*    Studies/Results: Dg Chest 1 View  02/18/2015   CLINICAL DATA:  Congestive heart failure  EXAM: CHEST  1 VIEW  COMPARISON:  05/19/2014  FINDINGS: Cardiomegaly with central vascular congestion noted and a few peripheral interstitial Kerley B-lines. Trace, if any, pleural effusions. No focal pulmonary opacity. No acute osseous abnormality.  IMPRESSION: Cardiomegaly with minimal interstitial pulmonary edema.   Electronically Signed   By: Conchita Paris M.D.   On: 02/18/2015 08:49   Mr Foot Right W Wo Contrast  02/18/2015   CLINICAL DATA:  Pain, swelling and  redness involving the right third toe. Recent callus removal approximately 4 weeks ago. X-ray findings suspicious for osteomyelitis.  EXAM: MRI OF THE RIGHT FOREFOOT WITHOUT AND WITH CONTRAST  TECHNIQUE: Multiplanar, multisequence MR imaging was performed both before and after administration of intravenous contrast.  CONTRAST:  2mL MULTIHANCE GADOBENATE DIMEGLUMINE 529 MG/ML IV SOLN  COMPARISON:  Radiographs 02/17/2015  FINDINGS: There is an open wound involving the dorsal aspect of the distal third toe with significant surrounding cellulitis. There is osteomyelitis involving the middle and distal phalanges with probable intervening septic arthritis at the interphalangeal joint. The proximal phalanx is intact. The other bony structures of the forefoot are intact. The other joints are intact.  There is diffuse cellulitis and myofasciitis but no discrete drainable soft tissue abscess or pyomyositis.  IMPRESSION: 1. Osteomyelitis involving the middle and distal phalanges of the third toe. Probable septic arthritis involving the interphalangeal joint. The proximal phalanx is not affected. 2. Diffuse cellulitis and myofasciitis but no findings for discrete drainable soft tissue abscess or pyomyositis.   Electronically Signed   By: Marijo Sanes M.D.   On: 02/18/2015 12:32   Dg Foot Complete Right  02/17/2015   CLINICAL DATA:  81 year old male with history of callus removal from the right middle toe 1 month ago now presenting with redness, swelling, pain in drainage from the middle and fourth toes.  EXAM: RIGHT FOOT COMPLETE - 3+ VIEW  COMPARISON:  No priors.  FINDINGS: Osteolysis of the third distal phalangeal tuft. Overlying soft tissues appear markedly irregular. Remaining bones appear intact. No acute displaced fracture, subluxation or dislocation.  IMPRESSION: Osteolysis of the tuft of the third distal phalanx, concerning for osteomyelitis. Overlying soft tissues also appear markedly swollen, suggesting soft  tissue infection as well.   Electronically Signed   By: Vinnie Langton M.D.   On: 02/17/2015 21:05    Medications:  Scheduled: . aspirin  81 mg Oral QHS  . atorvastatin  10 mg Oral QHS  . digoxin  125 mcg Oral Daily  . docusate sodium  100 mg Oral BID  . furosemide  40 mg Oral Daily  . levothyroxine  100 mcg Oral QAC breakfast  . metoprolol succinate  50 mg Oral BID  . piperacillin-tazobactam (ZOSYN)  IV  3.375 g Intravenous 3 times per day  . vancomycin  750 mg Intravenous Q12H   Continuous:  KWI:[id number] **OR** acetaminophen, morphine injection, ondansetron **OR** ondansetron (ZOFRAN) IV  Assessment/Plan:  Principal Problem:   Osteomyelitis Active Problems:   HLD (hyperlipidemia)   Benign essential HTN   MYOCARDIAL INFARCTION, HX OF   Atrial fibrillation   COPD with asthma   Chronic systolic CHF (congestive heart  failure), NYHA class 2   Chronic anticoagulation    Osteomyelitis of 3rd R toe Orthopedics is following and plan is for surgery to perform amputation this week, possibly on Wednesday. Patient remains on vancomycin and Zosyn. MRI report is as above. EKG reveals atrial fibrillation without any concerning ST or T-wave changes. Chest x-ray report reviewed. No clinical evidence for edema. Patient had an echocardiogram recently which showed normal systolic function without any wall motion abnormalities. [facility] perioperative cardiac risk criteria was utilized and it revealed that the estimated risk probability for perioperative myocardial infarction or cardiac arrest is about 1.79%. Patient has reasonably good functional capacity. He may proceed to surgery without further testing.   Chronic Afib Currently rate controlled. Continue home medications. Initially Coumadin was placed on hold. Discussed with Dr. Sharol Given this morning. The type of surgery that the patient needs does not require subtherapeutic INR. He is okay with resuming Coumadin for INR to be between 2 and 3.  So at this time we will resume Coumadin. Patient's CHADS score is 3 or 4 depending on whether or not he has hypertension, which is not very clear. Continue digoxin. Dig Level was normal.  HLD Continue statin per home regimen  Hypokalemia Repleted  Hx chronic systolic chf Last known EF was normal. He is clinically euvolemic. Continue oral furosemide.  Normocytic anemia. Hemoglobin close to baseline. Continue to monitor.  History of hypothyroidism. Continue with his levothyroxine.  DVT Prophylaxis: On Coumadin   Code Status: Full code  Family Communication: Discussed with the patient. Discussed with his daughter over the phone. Disposition Plan: Plan is for surgical amputation of the toe this week. PT/OT to be involved post operatively.    LOS: 2 days   [facility] Pager [phone] 02/19/2015, 9:26 AM  If 7PM-7AM, please contact night-coverage at [email or web address], password [facility]

## 2015-02-19 NOTE — Consult Note (Signed)
Reason for Consult: Osteomyelitis ulceration right third toe Referring Physician: Dr. Rutherford Nail is an 81 y.o. male.  HPI: Patient is an 81 year old gentleman who is been treated by podiatry for chronic osteomyelitis right foot third toe. Patient presents with necrotic draining ulcer from the third toe.  Past Medical History  Diagnosis Date  . Carotid artery occlusion     right total internal artery occlusion  . Coronary artery disease     s/p cardiac cath in 1990s, and cardiolite study  in 2005 showing EF 54%  . Hyperlipidemia   . Hypertension   . TIA (transient ischemic attack)   . Degenerative disc disease     cervical spine  . Arrhythmia   . Atrial fibrillation 03/2011  . Myocardial infarction 1992 and 1994  . Angina     "jaw & elbow was the worse"  . Pneumonia ~ 1935  . GERD (gastroesophageal reflux disease)   . Skin cancer     forehead  . Urgency incontinence   . Bilateral cellulitis of lower leg     Past Surgical History  Procedure Laterality Date  . Cataract extraction w/ intraocular lens  implant, bilateral    . Cardiac catheterization  1990s  . Skin cancer excision  12/2011    forehead    Family History  Problem Relation Age of Onset  . Heart disease Mother   . Malignant hyperthermia Mother   . Hypertension Mother   . Heart disease Father   . Malignant hyperthermia Father   . Hyperlipidemia Father   . Hypertension Father   . Heart disease Sister   . Hypertension Sister   . Heart disease Brother     Social History:  reports that he quit smoking about 25 years ago. His smoking use included Cigarettes. He has a 40 pack-year smoking history. He quit smokeless tobacco use about 24 years ago. He reports that he does not drink alcohol or use illicit drugs.  Allergies:  Allergies  Allergen Reactions  . Levofloxacin Rash    "thought they were sticking swords thru me"  . Klor-Con [Potassium Chloride Er] Diarrhea    DIARRHEA    Medications: I  have reviewed the patient's current medications.  Results for orders placed or performed during the hospital encounter of 02/17/15 (from the past 48 hour(s))  Sedimentation rate     Status: Abnormal   Collection Time: 02/17/15  8:00 PM  Result Value Ref Range   Sed Rate 23 (H) 0 - 16 mm/hr  CBC with Differential     Status: Abnormal   Collection Time: 02/17/15  8:00 PM  Result Value Ref Range   WBC 6.1 4.0 - 10.5 K/uL   RBC 3.29 (L) 4.22 - 5.81 MIL/uL   Hemoglobin 10.0 (L) 13.0 - 17.0 g/dL   HCT 30.9 (L) 39.0 - 52.0 %   MCV 93.9 78.0 - 100.0 fL   MCH 30.4 26.0 - 34.0 pg   MCHC 32.4 30.0 - 36.0 g/dL   RDW 18.6 (H) 11.5 - 15.5 %   Platelets 253 150 - 400 K/uL   Neutrophils Relative % 66 43 - 77 %   Neutro Abs 4.0 1.7 - 7.7 K/uL   Lymphocytes Relative 19 12 - 46 %   Lymphs Abs 1.1 0.7 - 4.0 K/uL   Monocytes Relative 13 (H) 3 - 12 %   Monocytes Absolute 0.8 0.1 - 1.0 K/uL   Eosinophils Relative 2 0 - 5 %   Eosinophils  Absolute 0.1 0.0 - 0.7 K/uL   Basophils Relative 0 0 - 1 %   Basophils Absolute 0.0 0.0 - 0.1 K/uL  Basic metabolic panel     Status: Abnormal   Collection Time: 02/17/15  8:00 PM  Result Value Ref Range   Sodium 138 135 - 145 mmol/L   Potassium 3.3 (L) 3.5 - 5.1 mmol/L   Chloride 99 96 - 112 mmol/L   CO2 33 (H) 19 - 32 mmol/L   Glucose, Bld 91 70 - 99 mg/dL   BUN 8 6 - 23 mg/dL   Creatinine, Ser 0.87 0.50 - 1.35 mg/dL   Calcium 8.6 8.4 - 10.5 mg/dL   GFR calc non Af Amer 75 (L) >90 mL/min   GFR calc Af Amer 87 (L) >90 mL/min    Comment: (NOTE) The eGFR has been calculated using the CKD EPI equation. This calculation has not been validated in all clinical situations. eGFR's persistently <90 mL/min signify possible Chronic Kidney Disease.    Anion gap 6 5 - 15  Protime-INR     Status: Abnormal   Collection Time: 02/17/15  8:00 PM  Result Value Ref Range   Prothrombin Time 22.9 (H) 11.6 - 15.2 seconds   INR 2.02 (H) 0.00 - 1.49  Hepatic function panel      Status: Abnormal   Collection Time: 02/17/15  8:00 PM  Result Value Ref Range   Total Protein 6.3 6.0 - 8.3 g/dL   Albumin 3.7 3.5 - 5.2 g/dL   AST 29 0 - 37 U/L   ALT 16 0 - 53 U/L   Alkaline Phosphatase 70 39 - 117 U/L   Total Bilirubin 0.1 (L) 0.3 - 1.2 mg/dL   Bilirubin, Direct 0.1 0.0 - 0.5 mg/dL   Indirect Bilirubin 0.0 (L) 0.3 - 0.9 mg/dL  Comprehensive metabolic panel     Status: Abnormal   Collection Time: 02/18/15  4:21 AM  Result Value Ref Range   Sodium 138 135 - 145 mmol/L   Potassium 3.7 3.5 - 5.1 mmol/L   Chloride 100 96 - 112 mmol/L   CO2 30 19 - 32 mmol/L   Glucose, Bld 105 (H) 70 - 99 mg/dL   BUN <5 (L) 6 - 23 mg/dL    Comment: REPEATED TO VERIFY   Creatinine, Ser 0.80 0.50 - 1.35 mg/dL   Calcium 8.3 (L) 8.4 - 10.5 mg/dL   Total Protein 5.3 (L) 6.0 - 8.3 g/dL   Albumin 2.8 (L) 3.5 - 5.2 g/dL   AST 27 0 - 37 U/L   ALT 14 0 - 53 U/L   Alkaline Phosphatase 62 39 - 117 U/L   Total Bilirubin 0.5 0.3 - 1.2 mg/dL   GFR calc non Af Amer 78 (L) >90 mL/min   GFR calc Af Amer >90 >90 mL/min    Comment: (NOTE) The eGFR has been calculated using the CKD EPI equation. This calculation has not been validated in all clinical situations. eGFR's persistently <90 mL/min signify possible Chronic Kidney Disease.    Anion gap 8 5 - 15  CBC     Status: Abnormal   Collection Time: 02/18/15  4:21 AM  Result Value Ref Range   WBC 5.8 4.0 - 10.5 K/uL   RBC 3.02 (L) 4.22 - 5.81 MIL/uL   Hemoglobin 9.2 (L) 13.0 - 17.0 g/dL   HCT 27.9 (L) 39.0 - 52.0 %   MCV 92.4 78.0 - 100.0 fL   MCH 30.5 26.0 - 34.0 pg  MCHC 33.0 30.0 - 36.0 g/dL   RDW 19.0 (H) 11.5 - 15.5 %   Platelets 213 150 - 400 K/uL  Protime-INR     Status: Abnormal   Collection Time: 02/18/15  4:21 AM  Result Value Ref Range   Prothrombin Time 23.7 (H) 11.6 - 15.2 seconds   INR 2.10 (H) 0.00 - 1.49  Heparin level (unfractionated)     Status: Abnormal   Collection Time: 02/18/15  8:09 AM  Result Value Ref Range    Heparin Unfractionated <0.10 (L) 0.30 - 0.70 IU/mL    Comment:        IF HEPARIN RESULTS ARE BELOW EXPECTED VALUES, AND PATIENT DOSAGE HAS BEEN CONFIRMED, SUGGEST FOLLOW UP TESTING OF ANTITHROMBIN III LEVELS. SPECIMEN CHECKED FOR CLOTS REPEATED TO VERIFY   CBC     Status: Abnormal   Collection Time: 02/18/15  8:09 AM  Result Value Ref Range   WBC 6.0 4.0 - 10.5 K/uL   RBC 3.22 (L) 4.22 - 5.81 MIL/uL   Hemoglobin 9.6 (L) 13.0 - 17.0 g/dL   HCT 29.7 (L) 39.0 - 52.0 %   MCV 92.2 78.0 - 100.0 fL   MCH 29.8 26.0 - 34.0 pg   MCHC 32.3 30.0 - 36.0 g/dL   RDW 19.1 (H) 11.5 - 15.5 %   Platelets 240 150 - 400 K/uL  Digoxin level     Status: Abnormal   Collection Time: 02/18/15 12:50 PM  Result Value Ref Range   Digoxin Level 0.5 (L) 0.8 - 2.0 ng/mL    Dg Chest 1 View  02/18/2015   CLINICAL DATA:  Congestive heart failure  EXAM: CHEST  1 VIEW  COMPARISON:  05/19/2014  FINDINGS: Cardiomegaly with central vascular congestion noted and a few peripheral interstitial Kerley B-lines. Trace, if any, pleural effusions. No focal pulmonary opacity. No acute osseous abnormality.  IMPRESSION: Cardiomegaly with minimal interstitial pulmonary edema.   Electronically Signed   By: Conchita Paris M.D.   On: 02/18/2015 08:49   Mr Foot Right W Wo Contrast  02/18/2015   CLINICAL DATA:  Pain, swelling and redness involving the right third toe. Recent callus removal approximately 4 weeks ago. X-ray findings suspicious for osteomyelitis.  EXAM: MRI OF THE RIGHT FOREFOOT WITHOUT AND WITH CONTRAST  TECHNIQUE: Multiplanar, multisequence MR imaging was performed both before and after administration of intravenous contrast.  CONTRAST:  6m MULTIHANCE GADOBENATE DIMEGLUMINE 529 MG/ML IV SOLN  COMPARISON:  Radiographs 02/17/2015  FINDINGS: There is an open wound involving the dorsal aspect of the distal third toe with significant surrounding cellulitis. There is osteomyelitis involving the middle and distal phalanges  with probable intervening septic arthritis at the interphalangeal joint. The proximal phalanx is intact. The other bony structures of the forefoot are intact. The other joints are intact.  There is diffuse cellulitis and myofasciitis but no discrete drainable soft tissue abscess or pyomyositis.  IMPRESSION: 1. Osteomyelitis involving the middle and distal phalanges of the third toe. Probable septic arthritis involving the interphalangeal joint. The proximal phalanx is not affected. 2. Diffuse cellulitis and myofasciitis but no findings for discrete drainable soft tissue abscess or pyomyositis.   Electronically Signed   By: PMarijo SanesM.D.   On: 02/18/2015 12:32   Dg Foot Complete Right  02/17/2015   CLINICAL DATA:  81year old male with history of callus removal from the right middle toe 1 month ago now presenting with redness, swelling, pain in drainage from the middle and fourth toes.  EXAM: RIGHT  FOOT COMPLETE - 3+ VIEW  COMPARISON:  No priors.  FINDINGS: Osteolysis of the third distal phalangeal tuft. Overlying soft tissues appear markedly irregular. Remaining bones appear intact. No acute displaced fracture, subluxation or dislocation.  IMPRESSION: Osteolysis of the tuft of the third distal phalanx, concerning for osteomyelitis. Overlying soft tissues also appear markedly swollen, suggesting soft tissue infection as well.   Electronically Signed   By: Vinnie Langton M.D.   On: 02/17/2015 21:05    Review of Systems  All other systems reviewed and are negative.  Blood pressure 137/58, pulse 63, temperature 98.5 F (36.9 C), temperature source Oral, resp. rate 17, height 6' (1.829 m), weight 77.111 kg (170 lb), SpO2 96 %. Physical Exam On examination patient has a palpable dorsalis pedis and posterior tibial pulse. He has a necrotic ulcer with foul-smelling drainage from the third toe. Ulcer probes to bone. Radiograph shows destructive bony changes. MRI scan shows findings consistent with  osteomyelitis of the third toe. INR 2.1 Assessment/Plan: Assessment: Necrotic ulcer with osteomyelitis right foot third toe.  Plan: Patient will require amputation of the third toe. I will plan for surgery this week. Patient should continue his Coumadin. Patient does not need to change Coumadin dosage.  Vail Basista V 02/19/2015, 6:23 AM

## 2015-02-20 ENCOUNTER — Other Ambulatory Visit (HOSPITAL_COMMUNITY): Payer: Self-pay | Admitting: Orthopedic Surgery

## 2015-02-20 DIAGNOSIS — I350 Nonrheumatic aortic (valve) stenosis: Secondary | ICD-10-CM

## 2015-02-20 DIAGNOSIS — E876 Hypokalemia: Secondary | ICD-10-CM

## 2015-02-20 LAB — BASIC METABOLIC PANEL
Anion gap: 7 (ref 5–15)
CALCIUM: 8.5 mg/dL (ref 8.4–10.5)
CHLORIDE: 100 mmol/L (ref 96–112)
CO2: 29 mmol/L (ref 19–32)
Creatinine, Ser: 0.91 mg/dL (ref 0.50–1.35)
GFR calc Af Amer: 86 mL/min — ABNORMAL LOW (ref >90)
GFR, EST NON AFRICAN AMERICAN: 74 mL/min — AB (ref >90)
Glucose, Bld: 93 mg/dL (ref 70–99)
POTASSIUM: 3.3 mmol/L — AB (ref 3.5–5.1)
Sodium: 136 mmol/L (ref 135–145)

## 2015-02-20 LAB — CBC
HCT: 32.5 % — ABNORMAL LOW (ref 39.0–52.0)
HEMOGLOBIN: 10.8 g/dL — AB (ref 13.0–17.0)
MCH: 30.2 pg (ref 26.0–34.0)
MCHC: 33.2 g/dL (ref 30.0–36.0)
MCV: 90.8 fL (ref 78.0–100.0)
PLATELETS: 249 10*3/uL (ref 150–400)
RBC: 3.58 MIL/uL — ABNORMAL LOW (ref 4.22–5.81)
RDW: 19.1 % — ABNORMAL HIGH (ref 11.5–15.5)
WBC: 9 10*3/uL (ref 4.0–10.5)

## 2015-02-20 LAB — PROTIME-INR
INR: 1.44 (ref 0.00–1.49)
Prothrombin Time: 17.7 seconds — ABNORMAL HIGH (ref 11.6–15.2)

## 2015-02-20 LAB — VANCOMYCIN, TROUGH: VANCOMYCIN TR: 15 ug/mL (ref 10.0–20.0)

## 2015-02-20 MED ORDER — VANCOMYCIN HCL IN DEXTROSE 1-5 GM/200ML-% IV SOLN
1000.0000 mg | Freq: Two times a day (BID) | INTRAVENOUS | Status: DC
  Administered 2015-02-20 – 2015-02-22 (×6): 1000 mg via INTRAVENOUS
  Filled 2015-02-20 (×8): qty 200

## 2015-02-20 MED ORDER — PROMETHAZINE HCL 25 MG/ML IJ SOLN: 6.2500 mg | INTRAMUSCULAR | Status: DC | PRN

## 2015-02-20 MED ORDER — WARFARIN SODIUM 5 MG PO TABS
10.0000 mg | ORAL_TABLET | Freq: Once | ORAL | Status: AC
  Administered 2015-02-20: 10 mg via ORAL
  Filled 2015-02-20: qty 2

## 2015-02-20 MED ORDER — FENTANYL CITRATE 0.05 MG/ML IJ SOLN: 25.0000 ug | INTRAMUSCULAR | Status: DC | PRN

## 2015-02-20 MED ORDER — MEPERIDINE HCL 25 MG/ML IJ SOLN: 6.2500 mg | INTRAMUSCULAR | Status: DC | PRN

## 2015-02-20 MED ORDER — POTASSIUM CHLORIDE 10 MEQ/100ML IV SOLN
10.0000 meq | INTRAVENOUS | Status: AC
  Administered 2015-02-20 (×4): 10 meq via INTRAVENOUS
  Filled 2015-02-20 (×4): qty 100

## 2015-02-20 NOTE — Progress Notes (Signed)
OT Cancellation Note  Patient Details Name: [name] [name] MRN: [id number] DOB: 1925/05/19   Cancelled Treatment:    Reason Eval/Treat Not Completed: Other (comment) Surgery scheduled 3/30. Will eval if appropriate. [place], OTR/L  [phone] 02/20/2015 02/20/2015, 10:19 PM

## 2015-02-20 NOTE — Progress Notes (Signed)
PT Cancellation Note  Patient Details Name: [name] [name] MRN: [id number] DOB: May 10, 1925   Cancelled Treatment:    Reason Eval/Treat Not Completed: Other (comment). Pt planned for R 3rd toe amputation on 02/21/15. PT to eval pt when appropriate s/p surgery. Per RN pt ambulating with them to/from bathroom with RW with 1 person assist. PT to eval pt s/p surgery when appropriate.   Kingsley Callander 02/20/2015, 11:36 AM   Kittie Plater, PT, DPT Pager #: [phone] Office #: [phone]

## 2015-02-20 NOTE — Clinical Social Work Placement (Signed)
Clinical Social Work Department CLINICAL SOCIAL WORK PLACEMENT NOTE 02/20/2015  Patient:  [name] [name], [name] [name]  Account Number:  [id number] Admit date:  02/17/2015  Clinical Social Worker:  Durward Fortes, CLINICAL SOCIAL WORKER  Date/time:  02/20/2015 12:21 PM  Clinical Social Work is seeking post-discharge placement for this patient at the following level of care:   SKILLED NURSING   (*CSW will update this form in Epic as items are completed)   02/20/2015  Patient/family provided with [facility] Department of Clinical Social Work's list of facilities offering this level of care within the geographic area requested by the patient (or if unable, by the patient's family).  02/20/2015  Patient/family informed of their freedom to choose among providers that offer the needed level of care, that participate in Medicare, Medicaid or managed care program needed by the patient, have an available bed and are willing to accept the patient.  02/20/2015  Patient/family informed of [facility]' ownership interest in [facility], as well as of the fact that they are under no obligation to receive care at this facility.  PASARR submitted to EDS on 02/20/2015 PASARR number received on 02/20/2015  FL2 transmitted to all facilities in geographic area requested by pt/family on  02/20/2015 FL2 transmitted to all facilities within larger geographic area on   Patient informed that his/her managed care company has contracts with or will negotiate with  certain facilities, including the following:     Patient/family informed of bed offers received:  02/22/2015 Patient chooses bed at [facility] Physician recommends and patient chooses bed at  n/a  Patient to be transferred to  [facility] on  02/23/2015 Patient to be transferred to facility by family Patient and family notified of transfer on 02/23/2015 Name of family member notified:  [name], daughter  The following physician request were entered in  Epic:   Additional Comments:   Kierra S. Wiley, BSW-Intern

## 2015-02-20 NOTE — Progress Notes (Signed)
ANTIBIOTIC CONSULT NOTE - FOLLOW UP  Pharmacy Consult for Vancomycin, Zosyn, Coumadin Indication: Osteo + Afib.  Allergies  Allergen Reactions  . Levofloxacin Rash    "thought they were sticking swords thru me"  . Klor-Con [Potassium Chloride Er] Diarrhea    DIARRHEA    Patient Measurements: Height: 6' (182.9 cm) Weight: 170 lb (77.111 kg) IBW/kg (Calculated) : 77.6 Adjusted Body Weight:   Vital Signs: Temp: 98.8 F (37.1 C) (03/29 0503) Temp Source: Oral (03/29 0503) BP: 134/76 mmHg (03/29 0503) Pulse Rate: 67 (03/29 0503) Intake/Output from previous day: 03/28 0701 - 03/29 0700 In: 120 [P.O.:120] Out: 1350 [Urine:1350] Intake/Output from this shift:    Labs:  Recent Labs  02/18/15 0421 02/18/15 0809 02/19/15 0547 02/20/15 0520  WBC 5.8 6.0 13.5* 9.0  HGB 9.2* 9.6* 9.4* 10.8*  PLT 213 240 215 249  CREATININE 0.80  --  0.82 0.91   Estimated Creatinine Clearance: 62.4 mL/min (by C-G formula based on Cr of 0.91).  Recent Labs  02/20/15 0900  VANCOTROUGH 15.0     Microbiology:   Anti-infectives    Start     Dose/Rate Route Frequency Ordered Stop   02/18/15 0915  piperacillin-tazobactam (ZOSYN) IVPB 3.375 g     3.375 g 12.5 mL/hr over 240 Minutes Intravenous 3 times per day 02/18/15 0913     02/17/15 2200  vancomycin (VANCOCIN) IVPB 750 mg/150 ml premix     750 mg 150 mL/hr over 60 Minutes Intravenous Every 12 hours 02/17/15 2123        Assessment: 81 yo [name] admitted 02/17/2015 with osteomyelitis. Pharmacy consulted to dose vancomycin/zosyn for toe wound and to start heparin for possible trip to OR 3/27.  PMH: afib, HTN, systolic HF, COPD, HLD, CAD (ho MI)  Coag: afib.Still plan for OR sometimes this week to amputate toe. Ortho said ok to resume coumadin. Dr. Sharol Given can do surgery with therapeutic INR.  CHADS VASC = 5. INR today is down to 1.44 because he has been off on coumadin for few days. - PTA coumadin was 5 mg MTWSa and Sunday and 7.5  mg on ThFr  ID: Toe osteo, Afeb, WBC up to 13.5>9. 3/26 Vanc>> - 3/29: VT 15 (done slightly early) 3/27 Zosyn >>  Card: Hx of HF, afib, HTN, and HLD. Dig level 0.5. On ASA81, lipitor10, digoxin, lasix, toprol  Endo/GI: hx of hypothyroidism on synthroid   Renal SCr today is 0.91  Hem/Onc: Hgb 10.8 up, Plts 249 up.  Goal of Therapy:  Vancomycin trough level 15-20 mcg/ml  Best practice: coumadin  Goal of Therapy:  Vancomycin trough level 15-20 mcg/ml  Plan:  Coumadin 10mg  po x 1 tonight. Zosyn 3.375g IV q8hr. Increase Vancomycin slightly to 1g IV q12h.   [name] [name], PharmD, [facility] Clinical Staff Pharmacist Pager [phone]  [name] [name] 02/20/2015,10:00 AM

## 2015-02-20 NOTE — Progress Notes (Signed)
[facility] PROGRESS NOTE  MITHCELL SCHUMPERT HYQ:[id number] DOB: Oct 17, 1925 DOA: 02/17/2015  PCP: [name], MD  Brief HPI: 81 year old Caucasian male with a past medical history of atrial fibrillation on Coumadin, history of TIA, coronary artery disease who presented with worsening redness, pain and swelling of the right third toe, status post recent callus removal by podiatry. Examination was concerning for gangrenous changes involving that toe. Patient was admitted to the hospital for further management.  Past medical history:  Past Medical History  Diagnosis Date  . Carotid artery occlusion     right total internal artery occlusion  . Coronary artery disease     s/p cardiac cath in 1990s, and cardiolite study  in 2005 showing EF 54%  . Hyperlipidemia   . Hypertension   . TIA (transient ischemic attack)   . Degenerative disc disease     cervical spine  . Arrhythmia   . Atrial fibrillation 03/2011  . Myocardial infarction 1992 and 1994  . Angina     "jaw & elbow was the worse"  . Pneumonia ~ 1935  . GERD (gastroesophageal reflux disease)   . Skin cancer     forehead  . Urgency incontinence   . Bilateral cellulitis of lower leg     Consultants: Orthopedics  Procedures: None yet  Antibiotics: Vancomycin 3/26 Zosyn 3/27  Subjective: Patient continues to well. Denies any pain in his toe.  Objective: Vital Signs  Filed Vitals:   02/19/15 0700 02/19/15 1501 02/19/15 2036 02/20/15 0503  BP: 146/56 135/63 147/65 134/76  Pulse: 65 67 68 67  Temp: 98.4 F (36.9 C) 98.7 F (37.1 C) 98.6 F (37 C) 98.8 F (37.1 C)  TempSrc: Oral Oral Oral Oral  Resp:  16 18 18   Height:      Weight:      SpO2: 94% 97% 96% 94%    Intake/Output Summary (Last 24 hours) at 02/20/15 0904 Last data filed at 02/20/15 0500  Gross per 24 hour  Intake    120 ml  Output   1150 ml  Net  -1030 ml   Filed Weights   02/17/15 1924 02/17/15 2331  Weight: 77.111 kg (170 lb)  77.111 kg (170 lb)    General appearance: alert, cooperative, appears stated age and no distress Resp: Decreased air entry at the bases without any definite crackles or wheezing. No rhonchi. Cardio: S1, S2 is irregularly irregular. No S3, S4. Systolic murmur at the aortic area. No rubs, or bruit. No significant pedal edema. GI: soft, non-tender; bowel sounds normal; no masses,  no organomegaly Extremities: Chronic venous stasis noted in both the legs. Right third toe is covered by dressing. When previously examined it had an open wound at the tip has necrotic changes. It was foul-smelling. Some bleeding was noted. Good dorsalis pedis pulses. Neurologic: No focal deficits  Lab Results:  Basic Metabolic Panel:  Recent Labs Lab 02/17/15 2000 02/18/15 0421 02/19/15 0547 02/20/15 0520  NA 138 138 136 136  K 3.3* 3.7 3.5 3.3*  CL 99 100 105 100  CO2 33* 30 28 29   GLUCOSE 91 105* 98 93  BUN 8 <5* <5* <5*  CREATININE 0.87 0.80 0.82 0.91  CALCIUM 8.6 8.3* 8.2* 8.5   Liver Function Tests:  Recent Labs Lab 02/17/15 2000 02/18/15 0421  AST 29 27  ALT 16 14  ALKPHOS 70 62  BILITOT 0.1* 0.5  PROT 6.3 5.3*  ALBUMIN 3.7 2.8*   CBC:  Recent Labs Lab  02/17/15 2000 02/18/15 0421 02/18/15 0809 02/19/15 0547 02/20/15 0520  WBC 6.1 5.8 6.0 13.5* 9.0  NEUTROABS 4.0  --   --   --   --   HGB 10.0* 9.2* 9.6* 9.4* 10.8*  HCT 30.9* 27.9* 29.7* 28.9* 32.5*  MCV 93.9 92.4 92.2 92.0 90.8  PLT 253 213 240 215 249    ProBNP (last 3 results)  Recent Labs  05/19/14 1537 07/10/14 1643  PROBNP 3398.0* 455.0*    Studies/Results: No results found.  Medications:  Scheduled: . aspirin  81 mg Oral QHS  . atorvastatin  10 mg Oral QHS  . digoxin  125 mcg Oral Daily  . docusate sodium  100 mg Oral BID  . furosemide  40 mg Oral Daily  . levothyroxine  100 mcg Oral QAC breakfast  . metoprolol succinate  50 mg Oral BID  . piperacillin-tazobactam (ZOSYN)  IV  3.375 g Intravenous 3  times per day  . potassium chloride  10 mEq Intravenous Q1 Hr x 4  . vancomycin  750 mg Intravenous Q12H  . Warfarin - Pharmacist Dosing Inpatient   Does not apply q1800   Continuous:  YQM:[id number] **OR** acetaminophen, morphine injection, ondansetron **OR** ondansetron (ZOFRAN) IV  Assessment/Plan:  Principal Problem:   Osteomyelitis Active Problems:   HLD (hyperlipidemia)   Benign essential HTN   MYOCARDIAL INFARCTION, HX OF   Atrial fibrillation   COPD with asthma   Chronic systolic CHF (congestive heart failure), NYHA class 2   Chronic anticoagulation    Osteomyelitis of 3rd R toe/Preoperative Evaluation Orthopedics is following and plan is for surgery to perform amputation this week, possibly on Wednesday. Patient remains on vancomycin and Zosyn. MRI report is as above. EKG reveals atrial fibrillation without any concerning ST or T-wave changes. Chest x-ray report reviewed. No clinical evidence for edema. Patient had an echocardiogram recently which showed normal systolic function without any wall motion abnormalities. Mild to moderate AS was noted. The [facility] perioperative cardiac risk criteria was utilized and it revealed that the estimated risk probability for perioperative myocardial infarction or cardiac arrest is about 1.79%. Patient has reasonably good functional capacity. He may proceed to surgery without further testing.   Chronic Afib Currently rate controlled. Continue home medications. Initially Coumadin was placed on hold. Discussed with Dr. Sharol Given. The type of surgery that the patient needs does not require subtherapeutic INR. He is okay with resuming Coumadin for INR to be between 2 and 3. So coumadin was resumed. Patient's CHADS score is 3 or 4 depending on whether or not he has hypertension, which is not very clear. Continue digoxin. Dig Level was normal.  HLD Continue statin per home regimen  Hypokalemia Patient developed diarrhea as a result of his oral  potassium. He is reluctant to take any today. He'll be prescribed intravenous potassium chloride.  Hx chronic systolic chf Last known EF was normal. He is clinically euvolemic. Continue oral furosemide.  Normocytic anemia. Hemoglobin close to baseline. Continue to monitor.  History of hypothyroidism. Continue with his levothyroxine.  DVT Prophylaxis: On Coumadin   Code Status: Full code  Family Communication: Discussed with the patient. Discussed with his daughter over the phoneyesterday. Disposition Plan: Plan is for surgical amputation of the toe this week. PT/OT to be involved post operatively.    LOS: 3 days   [place] [facility] Pager [phone] 02/20/2015, 9:04 AM  If 7PM-7AM, please contact night-coverage at [email or web address], password [facility]

## 2015-02-20 NOTE — Clinical Social Work Psychosocial (Cosign Needed)
Clinical Social Work Department BRIEF PSYCHOSOCIAL ASSESSMENT 02/20/2015  Patient:  [name] [name], [name] [name]     Account Number:  [id number]     Admit date:  02/17/2015  Clinical Social Worker:  Durward Fortes, CLINICAL SOCIAL WORKER  Date/Time:  02/20/2015 12:06 PM  Referred by:  Physician  Date Referred:  02/20/2015 Referred for  SNF Placement   Other Referral:   none.   Interview type:  Patient Other interview type:   none.    PSYCHOSOCIAL DATA Living Status:  WIFE Admitted from facility:   Level of care:   Primary support name:  [name] [name] Primary support relationship to patient:  CHILD, ADULT Degree of support available:   Adequate support.    CURRENT CONCERNS Current Concerns  Post-Acute Placement   Other Concerns:   none.    SOCIAL WORK ASSESSMENT / PLAN CSW and BSW-Intern consulted regarding possible SNF placement for pt once medically stable for discharge.    BSw-Intern met with pt at bedside to discuss possible SNF placement options for pt once medically stable for discharge. Pt notified BSW-Intern that pt has no particular facility that pt would like to go to once discharged. Pt informed BSW-Intern that pt lives with pt's wife [name] [name]) and they have been married for 67 years. The plan is for pt to return back home with [name] [name] at the time of discharge from SNF.    Pt informed BSW-Intern that pt's daughter [name] [name]) would be the person that would be taking care of both pt and pt's wife.    Pt is excited and looking foward to the return back home with pt's wife. Pt expressed no stressors as it relates to discharge plans.    CSW and BSW-Intern to continue to assist with discharge planning needs.   Assessment/plan status:  Psychosocial Support/Ongoing Assessment of Needs Other assessment/ plan:   none.   Information/referral to community resources:   Pt to be discharged to [facility] once medically stable for discharge.    PATIENT'S/FAMILY'S RESPONSE TO PLAN OF  CARE: Pt agreeable and understanding of CSW plan of care. Pt expressed no further concerns or questions at this time.       [name] [name], BSW-Intern

## 2015-02-20 NOTE — Anesthesia Preprocedure Evaluation (Addendum)
Anesthesia Evaluation  Patient identified by MRN, date of birth, ID band Patient awake    Reviewed: Allergy & Precautions, NPO status , Patient's Chart, lab work & pertinent test results, reviewed documented beta blocker date and time   Airway Mallampati: II  TM Distance: >3 FB Neck ROM: Full    Dental no notable dental hx.    Pulmonary asthma , pneumonia -, COPDformer smoker (quit 1990 40 pack year),  breath sounds clear to auscultation  Pulmonary exam normal       Cardiovascular hypertension, Pt. on medications and Pt. on home beta blockers + angina + CAD, + Past MI, + Peripheral Vascular Disease and +CHF + dysrhythmias Atrial Fibrillation Rhythm:Regular Rate:Normal  ECHO 12/2014 EF 55%   Neuro/Psych R carotid occluded, L 40% stenosis TIA   GI/Hepatic Neg liver ROS, GERD-  Medicated,  Endo/Other  Hypothyroidism   Renal/GU      Musculoskeletal   Abdominal   Peds  Hematology  (+) anemia , 10/32   Anesthesia Other Findings   Reproductive/Obstetrics                           Anesthesia Physical Anesthesia Plan  ASA: III  Anesthesia Plan: Regional   Post-op Pain Management: MAC Combined w/ Regional for Post-op pain   Induction:   Airway Management Planned:   Additional Equipment:   Intra-op Plan:   Post-operative Plan:   Informed Consent: I have reviewed the patients History and Physical, chart, labs and discussed the procedure including the risks, benefits and alternatives for the proposed anesthesia with the patient or authorized representative who has indicated his/her understanding and acceptance.   Dental advisory given  Plan Discussed with: CRNA  Anesthesia Plan Comments:        Anesthesia Quick Evaluation

## 2015-02-21 ENCOUNTER — Inpatient Hospital Stay (HOSPITAL_COMMUNITY): Payer: Medicare Other | Admitting: Certified Registered"

## 2015-02-21 ENCOUNTER — Encounter (HOSPITAL_COMMUNITY)
Admission: EM | Disposition: A | Discharge status: Skilled Nursing Facility | Payer: Self-pay | Source: Home / Self Care | Attending: Internal Medicine

## 2015-02-21 HISTORY — PX: AMPUTATION: SHX166

## 2015-02-21 LAB — SURGICAL PCR SCREEN
MRSA, PCR: NEGATIVE
Staphylococcus aureus: NEGATIVE

## 2015-02-21 LAB — PROTIME-INR
INR: 1.55 — ABNORMAL HIGH (ref 0.00–1.49)
PROTHROMBIN TIME: 18.7 s — AB (ref 11.6–15.2)

## 2015-02-21 LAB — BASIC METABOLIC PANEL
Anion gap: 4 — ABNORMAL LOW (ref 5–15)
BUN: 6 mg/dL (ref 6–23)
CHLORIDE: 100 mmol/L (ref 96–112)
CO2: 31 mmol/L (ref 19–32)
CREATININE: 1.05 mg/dL (ref 0.50–1.35)
Calcium: 8.6 mg/dL (ref 8.4–10.5)
GFR calc Af Amer: 72 mL/min — ABNORMAL LOW (ref >90)
GFR, EST NON AFRICAN AMERICAN: 62 mL/min — AB (ref >90)
Glucose, Bld: 90 mg/dL (ref 70–99)
Potassium: 3.6 mmol/L (ref 3.5–5.1)
Sodium: 135 mmol/L (ref 135–145)

## 2015-02-21 SURGERY — AMPUTATION, FOOT, RAY
Anesthesia: Regional | Site: Toe | Laterality: Right

## 2015-02-21 MED ORDER — HYDROMORPHONE HCL 1 MG/ML IJ SOLN: 0.2500 mg | INTRAMUSCULAR | Status: DC | PRN

## 2015-02-21 MED ORDER — OXYCODONE HCL 5 MG/5ML PO SOLN: 5.0000 mg | Freq: Once | ORAL | Status: DC | PRN

## 2015-02-21 MED ORDER — METHOCARBAMOL 1000 MG/10ML IJ SOLN: 500.0000 mg | Freq: Four times a day (QID) | INTRAVENOUS | Status: DC | PRN

## 2015-02-21 MED ORDER — ONDANSETRON HCL 4 MG PO TABS: 4.0000 mg | ORAL_TABLET | Freq: Four times a day (QID) | ORAL | Status: DC | PRN

## 2015-02-21 MED ORDER — EPHEDRINE SULFATE 50 MG/ML IJ SOLN
INTRAMUSCULAR | Status: AC
  Filled 2015-02-21: qty 1

## 2015-02-21 MED ORDER — ONDANSETRON HCL 4 MG/2ML IJ SOLN: 4.0000 mg | Freq: Four times a day (QID) | INTRAMUSCULAR | Status: DC | PRN

## 2015-02-21 MED ORDER — ROPIVACAINE HCL 5 MG/ML IJ SOLN
INTRAMUSCULAR | Status: DC | PRN
  Administered 2015-02-21: 30 mL

## 2015-02-21 MED ORDER — MEPERIDINE HCL 25 MG/ML IJ SOLN: 6.2500 mg | INTRAMUSCULAR | Status: DC | PRN

## 2015-02-21 MED ORDER — PROPOFOL 10 MG/ML IV BOLUS
INTRAVENOUS | Status: DC | PRN
  Administered 2015-02-21: 20 mg via INTRAVENOUS
  Administered 2015-02-21: 30 mg via INTRAVENOUS

## 2015-02-21 MED ORDER — LIDOCAINE HCL (CARDIAC) 20 MG/ML IV SOLN
INTRAVENOUS | Status: AC
  Filled 2015-02-21: qty 5

## 2015-02-21 MED ORDER — WARFARIN SODIUM 5 MG PO TABS
10.0000 mg | ORAL_TABLET | Freq: Once | ORAL | Status: AC
  Administered 2015-02-21: 10 mg via ORAL
  Filled 2015-02-21: qty 2

## 2015-02-21 MED ORDER — ONDANSETRON HCL 4 MG/2ML IJ SOLN
INTRAMUSCULAR | Status: AC
  Filled 2015-02-21: qty 2

## 2015-02-21 MED ORDER — 0.9 % SODIUM CHLORIDE (POUR BTL) OPTIME
TOPICAL | Status: DC | PRN
  Administered 2015-02-21: 1000 mL

## 2015-02-21 MED ORDER — LACTATED RINGERS IV SOLN
INTRAVENOUS | Status: DC | PRN
  Administered 2015-02-21: 12:00:00 via INTRAVENOUS

## 2015-02-21 MED ORDER — SUCCINYLCHOLINE CHLORIDE 20 MG/ML IJ SOLN
INTRAMUSCULAR | Status: AC
  Filled 2015-02-21: qty 1

## 2015-02-21 MED ORDER — SODIUM CHLORIDE 0.9 % IV SOLN
INTRAVENOUS | Status: DC
  Administered 2015-02-23: 12:00:00 via INTRAVENOUS

## 2015-02-21 MED ORDER — OXYCODONE HCL 5 MG PO TABS: 5.0000 mg | ORAL_TABLET | Freq: Once | ORAL | Status: DC | PRN

## 2015-02-21 MED ORDER — FENTANYL CITRATE 0.05 MG/ML IJ SOLN
INTRAMUSCULAR | Status: AC
  Filled 2015-02-21: qty 5

## 2015-02-21 MED ORDER — LACTATED RINGERS IV SOLN
INTRAVENOUS | Status: DC
  Administered 2015-02-21: 12:00:00 via INTRAVENOUS

## 2015-02-21 MED ORDER — PHENYLEPHRINE 40 MCG/ML (10ML) SYRINGE FOR IV PUSH (FOR BLOOD PRESSURE SUPPORT)
PREFILLED_SYRINGE | INTRAVENOUS | Status: AC
  Filled 2015-02-21: qty 20

## 2015-02-21 MED ORDER — METOCLOPRAMIDE HCL 5 MG/ML IJ SOLN: 5.0000 mg | Freq: Three times a day (TID) | INTRAMUSCULAR | Status: DC | PRN

## 2015-02-21 MED ORDER — ACETAMINOPHEN 325 MG PO TABS
650.0000 mg | ORAL_TABLET | Freq: Four times a day (QID) | ORAL | Status: DC | PRN
  Administered 2015-02-21 – 2015-02-22 (×3): 650 mg via ORAL
  Filled 2015-02-21: qty 2

## 2015-02-21 MED ORDER — PROMETHAZINE HCL 25 MG/ML IJ SOLN: 6.2500 mg | INTRAMUSCULAR | Status: DC | PRN

## 2015-02-21 MED ORDER — METHOCARBAMOL 500 MG PO TABS
500.0000 mg | ORAL_TABLET | Freq: Four times a day (QID) | ORAL | Status: DC | PRN
  Administered 2015-02-21 – 2015-02-22 (×3): 500 mg via ORAL
  Filled 2015-02-21 (×3): qty 1

## 2015-02-21 MED ORDER — FENTANYL CITRATE 0.05 MG/ML IJ SOLN
INTRAMUSCULAR | Status: DC | PRN
  Administered 2015-02-21 (×2): 50 ug via INTRAVENOUS

## 2015-02-21 MED ORDER — ACETAMINOPHEN 650 MG RE SUPP: 650.0000 mg | Freq: Four times a day (QID) | RECTAL | Status: DC | PRN

## 2015-02-21 MED ORDER — METOCLOPRAMIDE HCL 5 MG PO TABS: 5.0000 mg | ORAL_TABLET | Freq: Three times a day (TID) | ORAL | Status: DC | PRN

## 2015-02-21 MED ORDER — SODIUM CHLORIDE 0.9 % IJ SOLN
INTRAMUSCULAR | Status: AC
  Filled 2015-02-21: qty 10

## 2015-02-21 SURGICAL SUPPLY — 34 items
BLADE SAW SGTL MED 73X18.5 STR (BLADE) IMPLANT
BNDG COHESIVE 4X5 TAN STRL (GAUZE/BANDAGES/DRESSINGS) ×3 IMPLANT
BNDG GAUZE ELAST 4 BULKY (GAUZE/BANDAGES/DRESSINGS) ×3 IMPLANT
COVER SURGICAL LIGHT HANDLE (MISCELLANEOUS) ×3 IMPLANT
DRAPE U-SHAPE 47X51 STRL (DRAPES) ×6 IMPLANT
DRSG ADAPTIC 3X8 NADH LF (GAUZE/BANDAGES/DRESSINGS) ×3 IMPLANT
DRSG PAD ABDOMINAL 8X10 ST (GAUZE/BANDAGES/DRESSINGS) ×6 IMPLANT
DURAPREP 26ML APPLICATOR (WOUND CARE) ×3 IMPLANT
ELECT REM PT RETURN 9FT ADLT (ELECTROSURGICAL) ×3
ELECTRODE REM PT RTRN 9FT ADLT (ELECTROSURGICAL) ×1 IMPLANT
GAUZE SPONGE 4X4 12PLY STRL (GAUZE/BANDAGES/DRESSINGS) ×3 IMPLANT
GLOVE BIO SURGEON STRL SZ7 (GLOVE) ×3 IMPLANT
GLOVE BIOGEL PI IND STRL 6.5 (GLOVE) ×1 IMPLANT
GLOVE BIOGEL PI IND STRL 9 (GLOVE) ×1 IMPLANT
GLOVE BIOGEL PI INDICATOR 6.5 (GLOVE) ×2
GLOVE BIOGEL PI INDICATOR 9 (GLOVE) ×2
GLOVE SURG ORTHO 9.0 STRL STRW (GLOVE) ×3 IMPLANT
GOWN STRL REUS W/ TWL LRG LVL3 (GOWN DISPOSABLE) ×1 IMPLANT
GOWN STRL REUS W/ TWL XL LVL3 (GOWN DISPOSABLE) ×2 IMPLANT
GOWN STRL REUS W/TWL LRG LVL3 (GOWN DISPOSABLE) ×2
GOWN STRL REUS W/TWL XL LVL3 (GOWN DISPOSABLE) ×4
KIT BASIN OR (CUSTOM PROCEDURE TRAY) ×3 IMPLANT
KIT ROOM TURNOVER OR (KITS) ×3 IMPLANT
NS IRRIG 1000ML POUR BTL (IV SOLUTION) ×3 IMPLANT
PACK ORTHO EXTREMITY (CUSTOM PROCEDURE TRAY) ×3 IMPLANT
PAD ARMBOARD 7.5X6 YLW CONV (MISCELLANEOUS) ×6 IMPLANT
SPONGE GAUZE 4X4 12PLY STER LF (GAUZE/BANDAGES/DRESSINGS) ×3 IMPLANT
SPONGE LAP 18X18 X RAY DECT (DISPOSABLE) ×3 IMPLANT
STOCKINETTE IMPERVIOUS LG (DRAPES) IMPLANT
SUT ETHILON 2 0 PSLX (SUTURE) ×6 IMPLANT
TOWEL OR 17X24 6PK STRL BLUE (TOWEL DISPOSABLE) ×3 IMPLANT
TOWEL OR 17X26 10 PK STRL BLUE (TOWEL DISPOSABLE) ×3 IMPLANT
UNDERPAD 30X30 INCONTINENT (UNDERPADS AND DIAPERS) ×3 IMPLANT
WATER STERILE IRR 1000ML POUR (IV SOLUTION) ×3 IMPLANT

## 2015-02-21 NOTE — H&P (Signed)
[name] [name] is an 81 y.o. male.   Chief Complaint: Osteomyelitis abscess ulceration right foot third toe HPI: Patient is a 81 year old gentleman with ulceration abscess ostium myelitis right foot third toe which has failed conservative care.  Past Medical History  Diagnosis Date  . Carotid artery occlusion     right total internal artery occlusion  . Coronary artery disease     s/p cardiac cath in 1990s, and cardiolite study  in 2005 showing EF 54%  . Hyperlipidemia   . Hypertension   . TIA (transient ischemic attack)   . Degenerative disc disease     cervical spine  . Arrhythmia   . Atrial fibrillation 03/2011  . Myocardial infarction 1992 and 1994  . Angina     "jaw & elbow was the worse"  . Pneumonia ~ 1935  . GERD (gastroesophageal reflux disease)   . Skin cancer     forehead  . Urgency incontinence   . Bilateral cellulitis of lower leg     Past Surgical History  Procedure Laterality Date  . Cataract extraction w/ intraocular lens  implant, bilateral    . Cardiac catheterization  1990s  . Skin cancer excision  12/2011    forehead    Family History  Problem Relation Age of Onset  . Heart disease Mother   . Malignant hyperthermia Mother   . Hypertension Mother   . Heart disease Father   . Malignant hyperthermia Father   . Hyperlipidemia Father   . Hypertension Father   . Heart disease Sister   . Hypertension Sister   . Heart disease Brother    Social History:  reports that he quit smoking about 25 years ago. His smoking use included Cigarettes. He has a 40 pack-year smoking history. He quit smokeless tobacco use about 24 years ago. He reports that he does not drink alcohol or use illicit drugs.  Allergies:  Allergies  Allergen Reactions  . Levofloxacin Rash    "thought they were sticking swords thru me"  . Klor-Con [Potassium Chloride Er] Diarrhea    DIARRHEA    Medications Prior to Admission  Medication Sig Dispense Refill  . aspirin 81 MG tablet  Take 81 mg by mouth at bedtime.     Marland Kitchen LANOXIN 125 MCG tablet TAKE 1 TABLET DAILY 90 tablet 1  . metoprolol succinate (TOPROL-XL) 50 MG 24 hr tablet TAKE 1 TABLET TWICE A DAY 180 tablet 0  . atorvastatin (LIPITOR) 10 MG tablet TAKE 1 TABLET AT BEDTIME 90 tablet 1  . Calcium-Vitamin D (CALTRATE 600 PLUS-VIT D PO) Take 1 tablet by mouth at bedtime.     . furosemide (LASIX) 40 MG tablet Take 1 tablet (40 mg total) by mouth daily. 90 tablet 3  . HYDROcodone-homatropine (HYCODAN) 5-1.5 MG/5ML syrup 1/2 - 1 teaspoon at bedtime as needed for cough (Patient not taking: Reported on 01/22/2015) 120 mL 0  . levothyroxine (SYNTHROID, LEVOTHROID) 100 MCG tablet TAKE 1 TABLET DAILY 90 tablet 2  . methotrexate (RHEUMATREX) 2.5 MG tablet Take 10 mg by mouth once a week. Monday.    . Multiple Vitamin (MULTIVITAMIN WITH MINERALS) TABS tablet Take 1 tablet by mouth daily.    . nitroGLYCERIN (NITROSTAT) 0.4 MG SL tablet Place 1 tablet (0.4 mg total) under the tongue every 5 (five) minutes as needed for chest pain. 30 tablet 0  . traMADol (ULTRAM) 50 MG tablet Take 1-2 tablets by mouth daily as needed for arthritis    . warfarin (COUMADIN)  5 MG tablet Take as directed by anticoagulation clinic. (Patient taking differently: Take 5 mg by mouth. 7.5 mg on Thursday and Friday.  5 mg on Monday, Tuesday, Wednesday, Saturday, Sunday. On hold pending surgery.) 120 tablet 1    Results for orders placed or performed during the hospital encounter of 02/17/15 (from the past 48 hour(s))  Protime-INR     Status: Abnormal   Collection Time: 02/20/15  5:20 AM  Result Value Ref Range   Prothrombin Time 17.7 (H) 11.6 - 15.2 seconds   INR 1.44 0.00 - 1.49  CBC     Status: Abnormal   Collection Time: 02/20/15  5:20 AM  Result Value Ref Range   WBC 9.0 4.0 - 10.5 K/uL   RBC 3.58 (L) 4.22 - 5.81 MIL/uL   Hemoglobin 10.8 (L) 13.0 - 17.0 g/dL   HCT 32.5 (L) 39.0 - 52.0 %   MCV 90.8 78.0 - 100.0 fL   MCH 30.2 26.0 - 34.0 pg   MCHC  33.2 30.0 - 36.0 g/dL   RDW 19.1 (H) 11.5 - 15.5 %   Platelets 249 150 - 400 K/uL  Basic metabolic panel     Status: Abnormal   Collection Time: 02/20/15  5:20 AM  Result Value Ref Range   Sodium 136 135 - 145 mmol/L   Potassium 3.3 (L) 3.5 - 5.1 mmol/L   Chloride 100 96 - 112 mmol/L   CO2 29 19 - 32 mmol/L   Glucose, Bld 93 70 - 99 mg/dL   BUN <5 (L) 6 - 23 mg/dL   Creatinine, Ser 0.91 0.50 - 1.35 mg/dL   Calcium 8.5 8.4 - 10.5 mg/dL   GFR calc non Af Amer 74 (L) >90 mL/min   GFR calc Af Amer 86 (L) >90 mL/min    Comment: (NOTE) The eGFR has been calculated using the CKD EPI equation. This calculation has not been validated in all clinical situations. eGFR's persistently <90 mL/min signify possible Chronic Kidney Disease.    Anion gap 7 5 - 15  Vancomycin, trough     Status: None   Collection Time: 02/20/15  9:00 AM  Result Value Ref Range   Vancomycin Tr 15.0 10.0 - 20.0 ug/mL   No results found.  Review of Systems  All other systems reviewed and are negative.   Blood pressure 150/58, pulse 64, temperature 98.5 F (36.9 C), temperature source Oral, resp. rate 18, height 6' (1.829 m), weight 77.111 kg (170 lb), SpO2 95 %. Physical Exam  On examination patient has ulceration swelling cellulitis ostium myelitis right foot third toe Assessment/Plan Osteomyelitis ulceration right foot third toe.  Plan: We will plan for right foot third Ray amputation. Risk and benefits were discussed including risk of the wound healing. Patient states he understands wishes to proceed at this time.  Leea Rambeau V 02/21/2015, 6:35 AM

## 2015-02-21 NOTE — Progress Notes (Signed)
ANTICOAGULATION CONSULT NOTE - Follow Up Consult  Pharmacy Consult for warfarin Indication: atrial fibrillation  Allergies  Allergen Reactions  . Levofloxacin Rash    "thought they were sticking swords thru me"  . Klor-Con [Potassium Chloride Er] Diarrhea    DIARRHEA    Patient Measurements: Height: 6' (182.9 cm) Weight: 170 lb (77.111 kg) IBW/kg (Calculated) : 77.6   Vital Signs: Temp: 98.2 F (36.8 C) (03/30 1400) Temp Source: Oral (03/30 0442) BP: 125/72 mmHg (03/30 1400) Pulse Rate: 89 (03/30 1400)  Labs:  Recent Labs  02/19/15 0547 02/20/15 0520 02/21/15 0445 02/21/15 1004  HGB 9.4* 10.8*  --   --   HCT 28.9* 32.5*  --   --   PLT 215 249  --   --   LABPROT 19.7* 17.7*  --  18.7*  INR 1.66* 1.44  --  1.55*  CREATININE 0.82 0.91 1.05  --     Estimated Creatinine Clearance: 54.1 mL/min (by C-G formula based on Cr of 1.05).   Assessment: 89 YOM on warfarin for AFib. Warfarin was held in preparation for surgery, but ortho allowed restart prior to surgery. Patient is now s/p amputation of 3rd ray today. INR this morning 1.55. Hgb improved at 10.8, plts 249- no overt bleeding noted.  PTA dose of warfarin 5mg  daily except 7.5mg  on Thursdays and Fridays.  Goal of Therapy:  INR 2-3 Monitor platelets by anticoagulation protocol: Yes   Plan:  -warfarin 10mg  po x1 tonight -daily PT/INR -follow for s/s bleeding  Jillyan Plitt D. Latoyna Hird, PharmD, BCPS Clinical Pharmacist Pager: [phone] 02/21/2015 2:57 PM

## 2015-02-21 NOTE — Progress Notes (Signed)
Report given to philip rn as caregiver 

## 2015-02-21 NOTE — Anesthesia Postprocedure Evaluation (Signed)
Anesthesia Post Note  Patient: [name] [name]  Procedure(s) Performed: Procedure(s) (LRB): Right 3rd Ray Amputation (Right)  Anesthesia type: MAC + ankle block  Patient location: PACU  Post pain: Pain level controlled  Post assessment: Post-op Vital signs reviewed  Last Vitals: BP 125/72 mmHg  Pulse 89  Temp(Src) 36.8 C (Oral)  Resp 16  Ht 6' (1.829 m)  Wt 170 lb (77.111 kg)  BMI 23.05 kg/m2  SpO2 100%  Post vital signs: Reviewed  Level of consciousness: awake  Complications: No apparent anesthesia complications

## 2015-02-21 NOTE — Op Note (Signed)
02/17/2015 - 02/21/2015  12:49 PM  PATIENT:  [name] [name]    PRE-OPERATIVE DIAGNOSIS:  Osteomyelitis Right 3rd Toe  POST-OPERATIVE DIAGNOSIS:  Same  PROCEDURE:  Right 3rd Ray Amputation  SURGEON:  Newt Minion, MD  PHYSICIAN ASSISTANT:None ANESTHESIA:   General  PREOPERATIVE INDICATIONS:  [name] is a  81 y.o. male with a diagnosis of Osteomyelitis Right 3rd Toe who failed conservative measures and elected for surgical management.    The risks benefits and alternatives were discussed with the patient preoperatively including but not limited to the risks of infection, bleeding, nerve injury, cardiopulmonary complications, the need for revision surgery, among others, and the patient was willing to proceed.  OPERATIVE IMPLANTS: None  OPERATIVE FINDINGS: No deep abscess minimal petechial bleeding  OPERATIVE PROCEDURE: Patient was brought to the operating room after undergoing an ankle block. After adequate levels of anesthesia were obtained patient's right lower extremity was prepped using DuraPrep draped into a sterile field. A timeout was called. An elliptical incision was made around the toe and the ulcer. The third ray was resected to the midshaft of the third metatarsal. The ulcerative toe and metatarsal were resected in 1 block of tissue. Electrocautery was used for hemostasis. The incision was closed using 2-0 nylon. The wound was irrigated with normal saline. A sterile compressive dressing was applied. Patient was taken to the PACU in stable condition.

## 2015-02-21 NOTE — Clinical Social Work Note (Signed)
CSW spoke with patient's son-in-law, [name] [name] (02/20/2015) regarding placement options.  [name] [name] is from [place] and not familiar with this area.  [name] [name] states that patient's youngest daughter, [name] [name], who is from this area would be the main contact and decision maker.  CSW spoke patient's daughter, [name] [name], who informs CSW that she is interested in [facility] once patient is discharged.  SNF was able to offer a bed.  CSW confirmed bed availability with admission's coordinator.   Family is asking assistance with long-term care placement for patient's wife as well.  Patient was main caregiver for wife who has Alzheimer's and cannot be left alone.  Family is now caring for the wife/mother while patient is in the hospital.  CSW will make inquiry to SNF and request family follow-up/coordinate care for wife/mother.    [name] [name], [place] [phone]  Psychiatric & Orthopedics (5N 1-8) Clinical Social Worker

## 2015-02-21 NOTE — Progress Notes (Signed)
[facility] PROGRESS NOTE  [name] [name] SAY:[id number] DOB: [birth year] DOA: 02/17/2015  PCP: [name], MD  Brief HPI: 81 year old Caucasian male with a past medical history of atrial fibrillation on Coumadin, history of TIA, coronary artery disease who presented with worsening redness, pain and swelling of the right third toe, status post recent callus removal by podiatry. Examination was concerning for gangrenous changes involving that toe. Patient was admitted to the hospital for further management.  Past medical history:  Past Medical History  Diagnosis Date  . Carotid artery occlusion     right total internal artery occlusion  . Coronary artery disease     s/p cardiac cath in 1990s, and cardiolite study  in 2005 showing EF 54%  . Hyperlipidemia   . Hypertension   . TIA (transient ischemic attack)   . Degenerative disc disease     cervical spine  . Arrhythmia   . Atrial fibrillation 03/2011  . Myocardial infarction 1992 and 1994  . Angina     "jaw & elbow was the worse"  . Pneumonia ~ 1935  . GERD (gastroesophageal reflux disease)   . Skin cancer     forehead  . Urgency incontinence   . Bilateral cellulitis of lower leg     Consultants: Orthopedics  Procedures: None yet  Antibiotics: Vancomycin 3/26 Zosyn 3/27  Subjective: Patient feels well. Denies any complaints.   Objective: Vital Signs  Filed Vitals:   02/20/15 1959 02/21/15 0442 02/21/15 1014 02/21/15 1100  BP: 127/63 150/58 150/55 119/71  Pulse: 67 64  60  Temp: 98.7 F (37.1 C) 98.5 F (36.9 C)  98.4 F (36.9 C)  TempSrc: Oral Oral    Resp: 17 18  18   Height:      Weight:      SpO2: 97% 95%  96%    Intake/Output Summary (Last 24 hours) at 02/21/15 1238 Last data filed at 02/21/15 1238  Gross per 24 hour  Intake    500 ml  Output    500 ml  Net      0 ml   Filed Weights   02/17/15 1924 02/17/15 2331  Weight: 77.111 kg (170 lb) 77.111 kg (170 lb)    General  appearance: pt in NAD, alert nad awake Resp: no increased wob, no wheezes Cardio: S1, S2 is irregularly irregular. No S3, S4. No rubs, murmurs, or bruit. No significant pedal edema. GI: soft, non-tender; bowel sounds normal; no masses,  no organomegaly Extremities: Chronic venous stasis noted in both the legs. Right third toe has an open wound at the tip has necrotic changes. It's foul-smelling. Some bleeding is noted.  Neurologic: No focal deficits  Lab Results:  Basic Metabolic Panel:  Recent Labs Lab 02/17/15 2000 02/18/15 0421 02/19/15 0547 02/20/15 0520 02/21/15 0445  NA 138 138 136 136 135  K 3.3* 3.7 3.5 3.3* 3.6  CL 99 100 105 100 100  CO2 33* 30 28 29 31   GLUCOSE 91 105* 98 93 90  BUN 8 <5* <5* <5* 6  CREATININE 0.87 0.80 0.82 0.91 1.05  CALCIUM 8.6 8.3* 8.2* 8.5 8.6   Liver Function Tests:  Recent Labs Lab 02/17/15 2000 02/18/15 0421  AST 29 27  ALT 16 14  ALKPHOS 70 62  BILITOT 0.1* 0.5  PROT 6.3 5.3*  ALBUMIN 3.7 2.8*   CBC:  Recent Labs Lab 02/17/15 2000 02/18/15 0421 02/18/15 0809 02/19/15 0547 02/20/15 0520  WBC 6.1 5.8 6.0 13.5* 9.0  NEUTROABS  4.0  --   --   --   --   HGB 10.0* 9.2* 9.6* 9.4* 10.8*  HCT 30.9* 27.9* 29.7* 28.9* 32.5*  MCV 93.9 92.4 92.2 92.0 90.8  PLT 253 213 240 215 249    ProBNP (last 3 results)  Recent Labs  05/19/14 1537 07/10/14 1643  PROBNP 3398.0* 455.0*    Studies/Results: No results found.  Medications:  Scheduled: . [MAR Hold] aspirin  81 mg Oral QHS  . [MAR Hold] atorvastatin  10 mg Oral QHS  . [MAR Hold] digoxin  125 mcg Oral Daily  . [MAR Hold] docusate sodium  100 mg Oral BID  . [MAR Hold] furosemide  40 mg Oral Daily  . [MAR Hold] levothyroxine  100 mcg Oral QAC breakfast  . [MAR Hold] metoprolol succinate  50 mg Oral BID  . [MAR Hold] piperacillin-tazobactam (ZOSYN)  IV  3.375 g Intravenous 3 times per day  . [MAR Hold] vancomycin  1,000 mg Intravenous Q12H  . [MAR Hold] Warfarin -  Pharmacist Dosing Inpatient   Does not apply q1800   Continuous: . lactated ringers 10 mL/hr at 02/21/15 1154   PRN:[MAR Hold] acetaminophen **OR** [MAR Hold] acetaminophen, fentaNYL, meperidine (DEMEROL) injection, [MAR Hold]  morphine injection, [MAR Hold] ondansetron **OR** [MAR Hold] ondansetron (ZOFRAN) IV, promethazine  Assessment/Plan:  Principal Problem:   Osteomyelitis Active Problems:   HLD (hyperlipidemia)   Benign essential HTN   MYOCARDIAL INFARCTION, HX OF   Atrial fibrillation   COPD with asthma   Chronic systolic CHF (congestive heart failure), NYHA class 2   Chronic anticoagulation    Osteomyelitis of 3rd R toe Orthopedics is following and plan is for surgery to perform amputation this week, possibly on Wednesday. Patient remains on vancomycin and Zosyn. MRI report is as above. EKG reveals atrial fibrillation without any concerning ST or T-wave changes. Chest x-ray report reviewed. No clinical evidence for edema. Patient had an echocardiogram recently which showed normal systolic function without any wall motion abnormalities. The [facility] perioperative cardiac risk criteria was utilized and it revealed that the estimated risk probability for perioperative myocardial infarction or cardiac arrest is about 1.79%  Chronic Afib Currently rate controlled. Continue home medications. Initially Coumadin was placed on hold. Discussed with Dr. Sharol Given this morning. The type of surgery that the patient needs does not require subtherapeutic INR. He is okay with resuming Coumadin for INR to be between 2 and 3. So at this time we will resume Coumadin. Patient's CHADS score is 3 or 4 depending on whether or not he has hypertension, which is not very clear. Continue digoxin. Dig Level was normal.  HLD Stable, continue statin per home regimen  Hypokalemia - resolved.  Hx chronic systolic chf Last known EF was normal. He is clinically euvolemic. Continue oral furosemide.  Normocytic  anemia. Hemoglobin close to baseline. Continue to monitor.  History of hypothyroidism. Continue with his levothyroxine.  DVT Prophylaxis: On Coumadin   Code Status: Full code  Family Communication: Discussed with the patient. Discussed with his daughter over the phone. Disposition Plan: Plan is for surgical amputation of the toe this week. PT/OT to be involved post operatively.    LOS: 4 days   Malea Swilling, [facility] Pager [phone] 02/21/2015, 12:38 PM  If 7PM-7AM, please contact night-coverage at [email or web address], password [facility]

## 2015-02-21 NOTE — Progress Notes (Signed)
Orthopedic Tech Progress Note Patient Details:  [name] [name] Aug 13, 1925 [id number]  Ortho Devices Type of Ortho Device: Postop shoe/boot Ortho Device/Splint Location: rle Ortho Device/Splint Interventions: Application   Cary Wilford 02/21/2015, 2:26 PM

## 2015-02-21 NOTE — Transfer of Care (Signed)
Immediate Anesthesia Transfer of Care Note  Patient: [name] [name]  Procedure(s) Performed: Procedure(s) with comments: Right 3rd Ray Amputation (Right) - Digital block and ankle block with MAC  Patient Location: PACU  Anesthesia Type:MAC  Level of Consciousness: awake, alert  and oriented  Airway & Oxygen Therapy: Patient Spontanous Breathing and Patient connected to nasal cannula oxygen  Post-op Assessment: Report given to RN, Post -op Vital signs reviewed and stable and Patient moving all extremities  Post vital signs: Reviewed and stable  Last Vitals:  Filed Vitals:   02/21/15 1339  BP: 115/64  Pulse: 50  Temp:   Resp: 17    Complications: No apparent anesthesia complications

## 2015-02-21 NOTE — Anesthesia Procedure Notes (Signed)
Anesthesia Regional Block:  Ankle block  Pre-Anesthetic Checklist: ,, timeout performed, Correct Patient, Correct Site, Correct Laterality, Correct Procedure, Correct Position, site marked, Risks and benefits discussed, Surgical consent,  Pre-op evaluation,  Post-op pain management  Laterality: Right  Prep: chloraprep       Needles:  Injection technique: Single-shot     Needle Length: 4cm 4 cm Needle Gauge: 20 and 20 G    Additional Needles: Ankle block Narrative:  Injection made incrementally with aspirations every 5 mL.  Performed by: Personally  Anesthesiologist: Nolon Nations  Additional Notes: Tolerated well. Good blockade/anesthesia.

## 2015-02-22 ENCOUNTER — Encounter (HOSPITAL_COMMUNITY): Payer: Self-pay | Admitting: Orthopedic Surgery

## 2015-02-22 LAB — PROTIME-INR
INR: 2.3 — AB (ref 0.00–1.49)
PROTHROMBIN TIME: 25.5 s — AB (ref 11.6–15.2)

## 2015-02-22 MED ORDER — HYDROCODONE-ACETAMINOPHEN 5-325 MG PO TABS
2.0000 | ORAL_TABLET | Freq: Four times a day (QID) | ORAL | Status: DC | PRN
  Administered 2015-02-22 – 2015-02-23 (×2): 2 via ORAL
  Filled 2015-02-22 (×2): qty 2

## 2015-02-22 MED ORDER — INFLUENZA VAC SPLIT QUAD 0.5 ML IM SUSY
0.5000 mL | PREFILLED_SYRINGE | INTRAMUSCULAR | Status: AC
  Administered 2015-02-23: 0.5 mL via INTRAMUSCULAR
  Filled 2015-02-22: qty 0.5

## 2015-02-22 MED ORDER — WARFARIN SODIUM 5 MG PO TABS
5.0000 mg | ORAL_TABLET | Freq: Once | ORAL | Status: AC
  Administered 2015-02-22: 5 mg via ORAL
  Filled 2015-02-22: qty 1

## 2015-02-22 MED ORDER — HYDROCODONE-ACETAMINOPHEN 5-325 MG PO TABS: 1.0000 | ORAL_TABLET | ORAL | Status: DC | PRN

## 2015-02-22 MED ORDER — PNEUMOCOCCAL VAC POLYVALENT 25 MCG/0.5ML IJ INJ: 0.5000 mL | INJECTION | INTRAMUSCULAR | Status: DC

## 2015-02-22 MED ORDER — ENSURE ENLIVE PO LIQD
237.0000 mL | Freq: Two times a day (BID) | ORAL | Status: DC
  Administered 2015-02-23: 237 mL via ORAL

## 2015-02-22 NOTE — Progress Notes (Addendum)
[facility] PROGRESS NOTE  [name] [name] YYT:[id number] DOB: 1925-09-10 DOA: 02/17/2015 PCP: [name], MD  Brief narrative 81 year old male with history of A. fib on Coumadin, history of TIA, CAD presenting with worsened redness with pain and swelling of the right third toe following recent callus removal by podiatry. MRI of the foot showed a semi-otitis involving the middle and distal phalanx of the third toe with possible septic arthritis involving the interphalangeal joint along with diffuse cellulitis. He was seen by orthopedics and underwent ray amputation of the right third toe on 3/30.   Assessment/Plan: Osteomyelitis of the right third toe Status post reamputation of the right third toe on 3/30. Patient tolerated surgery well. On empiric IV antibiotic. Orthopedics recommend to continue IV antibiotic for today. Continue dressing as instructed by orthopedics for now. I will add when necessary Vicodin for pain as patient only on Tylenol and pain not controlled. -Seen by physical therapy. Patient has difficulty maintaining total weight dating at this time. PT recommend skilled nursing facility. Patient agrees for this. Social worker consulted.  Chronic A. fib Rate controlled. Continue Coumadin. INR therapeutic. And tinnitus toxin. Slight hypokalemia Replenish  Chronic systolic CHF Euvolemic. Continue home dose Lasix.  Hyperlipidemia Continue statin  Normocytic anemia Hemoglobin around baseline.  Protein calorie malnutrition Dietitian consulted  DVT prophylaxis: Coumadin  Diet: Heart healthy    Code Status: Full code Family Communication: None at bedside Disposition Plan: Possibly to skilled nursing facility tomorrow   Consultants:  Orthopedics (Dr. Sharol Given)  Procedures:  Reamputation of the right third toe on 3/31  Antibiotics:  IV vancomycin and Zosyn since 3/26  HPI/Subjective: Patient seen and examined. Complains of pain over the right  foot.  Objective: Filed Vitals:   02/22/15 0531  BP: 120/50  Pulse: 68  Temp: 99 F (37.2 C)  Resp: 16    Intake/Output Summary (Last 24 hours) at 02/22/15 0827 Last data filed at 02/22/15 0531  Gross per 24 hour  Intake    640 ml  Output    625 ml  Net     15 ml   Filed Weights   02/17/15 1924 02/17/15 2331  Weight: 77.111 kg (170 lb) 77.111 kg (170 lb)    Exam:   General:  Elderly thin built male in no acute distress  HEENT: No pallor, moist oral mucosa, supple neck  CVS: S1 and S2 is irregularly irregular, no murmurs rub or gallop  Respiratory: Clear to auscultation bilaterally, no added sounds  GI: Soft, nondistended, nontender, bowel sounds present  Musculoskeletal: Clean Dressing over right foot, no edema  CNS: Alert and oriented   Data Reviewed: Basic Metabolic Panel:  Recent Labs Lab 02/17/15 2000 02/18/15 0421 02/19/15 0547 02/20/15 0520 02/21/15 0445  NA 138 138 136 136 135  K 3.3* 3.7 3.5 3.3* 3.6  CL 99 100 105 100 100  CO2 33* 30 28 29 31   GLUCOSE 91 105* 98 93 90  BUN 8 <5* <5* <5* 6  CREATININE 0.87 0.80 0.82 0.91 1.05  CALCIUM 8.6 8.3* 8.2* 8.5 8.6   Liver Function Tests:  Recent Labs Lab 02/17/15 2000 02/18/15 0421  AST 29 27  ALT 16 14  ALKPHOS 70 62  BILITOT 0.1* 0.5  PROT 6.3 5.3*  ALBUMIN 3.7 2.8*   No results for input(s): LIPASE, AMYLASE in the last 168 hours. No results for input(s): AMMONIA in the last 168 hours. CBC:  Recent Labs Lab 02/17/15 2000 02/18/15 0421 02/18/15 0809 02/19/15 2751  02/20/15 0520  WBC 6.1 5.8 6.0 13.5* 9.0  NEUTROABS 4.0  --   --   --   --   HGB 10.0* 9.2* 9.6* 9.4* 10.8*  HCT 30.9* 27.9* 29.7* 28.9* 32.5*  MCV 93.9 92.4 92.2 92.0 90.8  PLT 253 213 240 215 249   Cardiac Enzymes: No results for input(s): CKTOTAL, CKMB, CKMBINDEX, TROPONINI in the last 168 hours. BNP (last 3 results) No results for input(s): BNP in the last 8760 hours.  ProBNP (last 3 results)  Recent  Labs  05/19/14 1537 07/10/14 1643  PROBNP 3398.0* 455.0*    CBG: No results for input(s): GLUCAP in the last 168 hours.  Recent Results (from the past 240 hour(s))  Blood culture (routine x 2)     Status: None (Preliminary result)   Collection Time: 02/17/15  8:05 PM  Result Value Ref Range Status   Specimen Description BLOOD RIGHT ARM  Final   Special Requests BOTTLES DRAWN AEROBIC AND ANAEROBIC 10CC EACH  Final   Culture   Final           BLOOD CULTURE RECEIVED NO GROWTH TO DATE CULTURE WILL BE HELD FOR 5 DAYS BEFORE ISSUING A FINAL NEGATIVE REPORT Note: Culture results may be compromised due to an excessive volume of blood received in culture bottles. Performed at [facility]    Report Status PENDING  Incomplete  Blood culture (routine x 2)     Status: None (Preliminary result)   Collection Time: 02/17/15  8:05 PM  Result Value Ref Range Status   Specimen Description BLOOD LEFT ARM  Final   Special Requests BOTTLES DRAWN AEROBIC AND ANAEROBIC 5CC EACH  Final   Culture   Final           BLOOD CULTURE RECEIVED NO GROWTH TO DATE CULTURE WILL BE HELD FOR 5 DAYS BEFORE ISSUING A FINAL NEGATIVE REPORT Performed at [facility]    Report Status PENDING  Incomplete  Surgical pcr screen     Status: None   Collection Time: 02/21/15 11:35 AM  Result Value Ref Range Status   MRSA, PCR NEGATIVE NEGATIVE Final   Staphylococcus aureus NEGATIVE NEGATIVE Final    Comment:        The Xpert SA Assay (FDA approved for NASAL specimens in patients over 75 years of age), is one component of a comprehensive surveillance program.  Test performance has been validated by [facility] for patients greater than or equal to 81 year old. It is not intended to diagnose infection nor to guide or monitor treatment.      Studies: No results found.  Scheduled Meds: . aspirin  81 mg Oral QHS  . atorvastatin  10 mg Oral QHS  . digoxin  125 mcg Oral Daily  . docusate sodium   100 mg Oral BID  . furosemide  40 mg Oral Daily  . [START ON 02/23/2015] Influenza vac split quadrivalent PF  0.5 mL Intramuscular Tomorrow-1000  . levothyroxine  100 mcg Oral QAC breakfast  . metoprolol succinate  50 mg Oral BID  . piperacillin-tazobactam (ZOSYN)  IV  3.375 g Intravenous 3 times per day  . [START ON 02/23/2015] pneumococcal 23 valent vaccine  0.5 mL Intramuscular Tomorrow-1000  . vancomycin  1,000 mg Intravenous Q12H  . Warfarin - Pharmacist Dosing Inpatient   Does not apply q1800   Continuous Infusions: . sodium chloride    . lactated ringers 10 mL/hr at 02/21/15 1154  Time spent: 20 minutes    Louellen Molder  [facility] Pager [phone] If 7PM-7AM, please contact night-coverage at [email or web address], password [facility] 02/22/2015, 8:27 AM  LOS: 5 days

## 2015-02-22 NOTE — Progress Notes (Signed)
ANTICOAGULATION CONSULT NOTE - Follow Up Consult  Pharmacy Consult for Coumadin Indication: atrial fibrillation  Allergies  Allergen Reactions  . Levofloxacin Rash    "thought they were sticking swords thru me"  . Klor-Con [Potassium Chloride Er] Diarrhea    DIARRHEA    Patient Measurements: Height: 6' (182.9 cm) Weight: 170 lb (77.111 kg) IBW/kg (Calculated) : 77.6 Heparin Dosing Weight:    Vital Signs: Temp: 98.5 F (36.9 C) (03/31 0900) Temp Source: Oral (03/31 0900) BP: 106/58 mmHg (03/31 0900) Pulse Rate: 62 (03/31 0900)  Labs:  Recent Labs  02/20/15 0520 02/21/15 0445 02/21/15 1004 02/22/15 0754  HGB 10.8*  --   --   --   HCT 32.5*  --   --   --   PLT 249  --   --   --   LABPROT 17.7*  --  18.7* 25.5*  INR 1.44  --  1.55* 2.30*  CREATININE 0.91 1.05  --   --     Estimated Creatinine Clearance: 54.1 mL/min (by C-G formula based on Cr of 1.05).   Assessment: 81 yo male admitted 02/17/2015 with osteomyelitis. Pharmacy consulted to dose anticoagulation and vancomycin/zosyn for toe wound.  PMH: afib, HTN, systolic HF, COPD, HLD, CAD (ho MI)  Coag: afib and TIA. Ortho said ok to resume coumadin-Dr. Sharol Given can do surgery with therapeutic INR. CHADS VASC = 5. INR 1.55>2.3 -PTA coumadin was 5 mg MTWSa and Sunday and 7.5 mg on ThFr  ID: Toe osteo, Afeb, WBC 13.5>9. S/p 3rd ray amputation 3/30  3/26 Vanc>> - 3/29 VT = 15 (done slightly early) 3/27 Zosyn >>  3/26 BCx: NGTD  Card: Hx of HF, afib, HTN, and HLD. Dig level 0.5. BP 106/58, HR 62 on-ASA81, lipitor10, digoxin, lasix, toprol  Endo/GI: hx of hypothyroidism on synthroid, no recent TSH  Renal: SCr 0.8>0.9>1.05, est CrCl ~50-16mL/min. Lytes ok  Hem/Onc: Hgb 10.8, Plts 249- slight increases, no bleeding noted  Best practice: coumadin  Goal of Therapy:  INR 2-3 Monitor platelets by anticoagulation protocol: Yes   Plan:  -Coumadin 5mg  today. Daily INR -Zosyn 3.375g IV q8hr. -Vancomycin 1g IV  q12h   Jeremyah Jelley S. Alford Highland, PharmD, BCPS Clinical Staff Pharmacist Pager [phone]  [place], [place] 02/22/2015,1:57 PM

## 2015-02-22 NOTE — Progress Notes (Signed)
OT Cancellation Note  Patient Details Name: [name] [name] MRN: [id number] DOB: [birth year]   Cancelled Treatment:    Reason Eval/Treat Not Completed: Other (comment) Pt is Medicare and current D/C plan is SNF. No apparent immediate acute care OT needs, therefore will defer OT to SNF. If OT eval is needed please call Acute Rehab Dept. at [phone] or text page OT at [phone].  [place], OTR/L  [phone] 02/22/2015 02/22/2015, 6:40 AM

## 2015-02-22 NOTE — Progress Notes (Signed)
Patient ID: [name] [name], male   DOB: 12-12-1924, 81 y.o.   MRN: [id number] Postoperative day 1 right foot third Ray amputation. Physical therapy minimize weightbearing right foot. Discontinue IV antibiotics tomorrow. Discharge to skilled nursing facility.

## 2015-02-22 NOTE — Progress Notes (Addendum)
INITIAL NUTRITION ASSESSMENT  Pt meets criteria for SEVERE MALNUTRITION in the context of chronic illness as evidenced by severe fat and muscle mass loss.  DOCUMENTATION CODES Per approved criteria  -Severe malnutrition in the context of chronic illness   INTERVENTION: Provide Ensure Enlive po BID, each supplement provides 350 kcal and 20 grams of protein.  Downgrade diet to dysphagia 3 diet.   Encourage adequate PO intake.  NUTRITION DIAGNOSIS: Malnutrition related to chronic illness as evidenced by severe fat and muscle mass loss.   Goal: Pt to meet >/= 90% of their estimated nutrition needs   Monitor:  PO intake, weight trends, labs, I/O's  Reason for Assessment: MD consult for assessment of nutrition requirements/status  81 y.o. male  Admitting Dx: Osteomyelitis  ASSESSMENT: Pt with ulceration abscess ostium myelitis right foot third toe which has failed conservative care. Pt with a past medical history of atrial fibrillation on Coumadin, history of TIA, coronary artery disease.   PROCEDURE (3/30): Right 3rd Ray Amputation  Pt reports his appetite is "so so". Current meal completion is 25-50%. Pt reports he has been having trouble consuming his meals due to his dentures not fitting correctly. RD to downgrade diet to dysphagia 3. Weight has been stable. Pt reports he usually eats 2 meals a day along with an Ensure daily. RD to order Ensure to aid in caloric and protein needs. Pt was encouraged to eat his food at meals and to drink his supplements.   Nutrition Focused Physical Exam:  Subcutaneous Fat:  Orbital Region: N/A Upper Arm Region: Moderate depletion Thoracic and Lumbar Region: Severe depletion  Muscle:  Temple Region: N/A Clavicle Bone Region: Severe depletion Clavicle and Acromion Bone Region: Severe depletion Scapular Bone Region: N/A Dorsal Hand: N/A Patellar Region: Moderate depletion Anterior Thigh Region: Severe depletion Posterior Calf Region:  N/A  Edema: +2 LE edema  Labs: Low GFR.  Height: Ht Readings from Last 1 Encounters:  02/17/15 6' (1.829 m)    Weight: Wt Readings from Last 1 Encounters:  02/17/15 170 lb (77.111 kg)    Ideal Body Weight: 178 lbs  % Ideal Body Weight: 96%  Wt Readings from Last 10 Encounters:  02/17/15 170 lb (77.111 kg)  01/01/15 167 lb (75.751 kg)  10/12/14 170 lb (77.111 kg)  09/12/14 171 lb (77.565 kg)  07/13/14 171 lb (77.565 kg)  07/10/14 171 lb (77.565 kg)  05/31/14 169 lb (76.658 kg)  12/22/13 175 lb 1.9 oz (79.434 kg)  07/04/13 175 lb (79.379 kg)  12/22/12 189 lb (85.73 kg)    Usual Body Weight: 170 lbs  % Usual Body Weight: 100%  BMI:  Body mass index is 23.05 kg/(m^2).  Estimated Nutritional Needs: Kcal: 1850-2000 Protein: 85-95 grams Fluid: 1.8 - 2 L/day  Skin: Incision/wound on right toe, +2 LE edema  Diet Order: Diet Carb Modified Fluid consistency:: Thin; Room service appropriate?: Yes  EDUCATION NEEDS: -No education needs identified at this time   Intake/Output Summary (Last 24 hours) at 02/22/15 1010 Last data filed at 02/22/15 0903  Gross per 24 hour  Intake    646 ml  Output    815 ml  Net   -169 ml    Last BM: 3/29  Labs:   Recent Labs Lab 02/19/15 0547 02/20/15 0520 02/21/15 0445  NA 136 136 135  K 3.5 3.3* 3.6  CL 105 100 100  CO2 28 29 31   BUN <5* <5* 6  CREATININE 0.82 0.91 1.05  CALCIUM 8.2* 8.5 8.6  GLUCOSE 98 93 90    CBG (last 3)  No results for input(s): GLUCAP in the last 72 hours.  Scheduled Meds: . aspirin  81 mg Oral QHS  . atorvastatin  10 mg Oral QHS  . digoxin  125 mcg Oral Daily  . docusate sodium  100 mg Oral BID  . furosemide  40 mg Oral Daily  . [START ON 02/23/2015] Influenza vac split quadrivalent PF  0.5 mL Intramuscular Tomorrow-1000  . levothyroxine  100 mcg Oral QAC breakfast  . metoprolol succinate  50 mg Oral BID  . piperacillin-tazobactam (ZOSYN)  IV  3.375 g Intravenous 3 times per day  .  [START ON 02/23/2015] pneumococcal 23 valent vaccine  0.5 mL Intramuscular Tomorrow-1000  . vancomycin  1,000 mg Intravenous Q12H  . Warfarin - Pharmacist Dosing Inpatient   Does not apply q1800    Continuous Infusions: . sodium chloride    . lactated ringers 10 mL/hr at 02/21/15 1154    Past Medical History  Diagnosis Date  . Carotid artery occlusion     right total internal artery occlusion  . Coronary artery disease     s/p cardiac cath in 1990s, and cardiolite study  in 2005 showing EF 54%  . Hyperlipidemia   . Hypertension   . TIA (transient ischemic attack)   . Degenerative disc disease     cervical spine  . Arrhythmia   . Atrial fibrillation 03/2011  . Myocardial infarction 1992 and 1994  . Angina     "jaw & elbow was the worse"  . Pneumonia ~ 1935  . GERD (gastroesophageal reflux disease)   . Skin cancer     forehead  . Urgency incontinence   . Bilateral cellulitis of lower leg     Past Surgical History  Procedure Laterality Date  . Cataract extraction w/ intraocular lens  implant, bilateral    . Cardiac catheterization  1990s  . Skin cancer excision  12/2011    forehead    Kallie Locks, MS, RD, LDN Pager # [phone] After hours/ weekend pager # [phone]

## 2015-02-22 NOTE — Evaluation (Addendum)
Physical Therapy Evaluation Patient Details Name: [name] [name] MRN: [id number] DOB: September 29, 1925 Today's Date: 02/22/2015   History of Present Illness  Pt is a 81 y.o. 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   Clinical Impression  Patient is s/p above surgery resulting in functional limitations due to the deficits listed below (see PT Problem List).  Patient will benefit from skilled PT to increase their independence and safety with mobility to allow discharge to the venue listed below.  Pt with difficulty maintaining TWB status with mobility and c/o dizziness. Pt will require SNF for 24/7 (A) and cont rehab prior to D/C home.      Follow Up Recommendations SNF    Equipment Recommendations  Rolling walker with 5" wheels    Recommendations for Other Services OT consult     Precautions / Restrictions Precautions Precautions: Fall Required Braces or Orthoses: Other Brace/Splint Other Brace/Splint: post op shoe Restrictions Weight Bearing Restrictions: Yes RLE Weight Bearing: Touchdown weight bearing      Mobility  Bed Mobility               General bed mobility comments: pt up in bathroom upon PT entering with student nurse; pt did not have post op shoe on  Transfers Overall transfer level: Needs assistance Equipment used: Rolling walker (2 wheeled) Transfers: Stand Pivot Transfers;Sit to/from Stand Sit to Stand: Max assist Stand pivot transfers: +2 safety/equipment;+2 physical assistance;Mod assist       General transfer comment: pt unable to mobilize while adhering to TWB status on Rt LE; pt requiring 2 person (A) to mobilize and transfer to <> from toilet <> recliner; max cueing for safety; pt c/o dizziness after voiding   Ambulation/Gait             General Gait Details: limited to pivotal steps due to difficulty maintaining TWB status  Stairs            Wheelchair Mobility    Modified Rankin (Stroke Patients Only)       Balance Overall balance  assessment: Needs assistance Sitting-balance support: Feet supported;No upper extremity supported Sitting balance-Leahy Scale: Fair     Standing balance support: During functional activity;Bilateral upper extremity supported Standing balance-Leahy Scale: Zero Standing balance comment: (A) and RW to balance                             Pertinent Vitals/Pain Pain Assessment: Faces Faces Pain Scale: Hurts little more Pain Location: Rt foot Pain Descriptors / Indicators: Grimacing Pain Intervention(s): Monitored during session;Premedicated before session;Repositioned    Home Living Family/patient expects to be discharged to:: Skilled nursing facility                 Additional Comments: 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    Prior Function Level of Independence: Independent         Comments: pt drives and is caregiver for wife     Hand Dominance        Extremity/Trunk Assessment   Upper Extremity Assessment: Defer to OT evaluation           Lower Extremity Assessment: Generalized weakness      Cervical / Trunk Assessment: Kyphotic  Communication   Communication: No difficulties  Cognition Arousal/Alertness: Awake/alert Behavior During Therapy: WFL for tasks assessed/performed Overall Cognitive Status: Within Functional Limits for tasks assessed       Memory: Decreased recall of precautions  General Comments      Exercises        Assessment/Plan    PT Assessment Patient needs continued PT services  PT Diagnosis Difficulty walking;Generalized weakness;Acute pain   PT Problem List Decreased strength;Decreased activity tolerance;Decreased balance;Decreased mobility;Decreased knowledge of use of DME;Decreased safety awareness;Decreased knowledge of precautions;Pain  PT Treatment Interventions DME instruction;Gait training;Functional mobility training;Therapeutic activities;Therapeutic exercise;Balance training;Neuromuscular  re-education;Patient/family education   PT Goals (Current goals can be found in the Care Plan section) Acute Rehab PT Goals Patient Stated Goal: to get better and take care of my wife PT Goal Formulation: With patient Time For Goal Achievement: 03/01/15 Potential to Achieve Goals: Fair    Frequency Min 5X/week   Barriers to discharge Decreased caregiver support 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    Co-evaluation               End of Session Equipment Utilized During Treatment: Gait belt;Other (comment) (post op shoe) Activity Tolerance: Patient limited by fatigue Patient left: in chair;with call bell/phone within reach;with chair alarm set Nurse Communication: Mobility status;Precautions;Weight bearing status         Time: 1005-1029 PT Time Calculation (min) (ACUTE ONLY): 24 min   Charges:   PT Evaluation $Initial PT Evaluation Tier I: 1 Procedure PT Treatments $Therapeutic Activity: 8-22 mins   PT G CodesGustavus Bryant PT [phone] 02/22/2015, 1:09 PM

## 2015-02-22 NOTE — Discharge Instructions (Signed)
Information on my medicine - Coumadin®   (Warfarin) ° °This medication education was reviewed with me or my healthcare representative as part of my discharge preparation.  The pharmacist that spoke with me during my hospital stay was:  Octavious Zidek Stillinger, RPH ° °Why was Coumadin prescribed for you? °Coumadin was prescribed for you because you have a blood clot or a medical condition that can cause an increased risk of forming blood clots. Blood clots can cause serious health problems by blocking the flow of blood to the heart, lung, or brain. Coumadin can prevent harmful blood clots from forming. °As a reminder your indication for Coumadin is:   Stroke Prevention Because Of Atrial Fibrillation ° °What test will check on my response to Coumadin? °While on Coumadin (warfarin) you will need to have an INR test regularly to ensure that your dose is keeping you in the desired range. The INR (international normalized ratio) number is calculated from the result of the laboratory test called prothrombin time (PT). ° °If an INR APPOINTMENT HAS NOT ALREADY BEEN MADE FOR YOU please schedule an appointment to have this lab work done by your health care provider within 7 days. °Your INR goal is usually a number between:  2 to 3 or your provider may give you a more narrow range like 2-2.5.  Ask your health care provider during an office visit what your goal INR is. ° °What  do you need to  know  About  COUMADIN? °Take Coumadin (warfarin) exactly as prescribed by your healthcare provider about the same time each day.  DO NOT stop taking without talking to the doctor who prescribed the medication.  Stopping without other blood clot prevention medication to take the place of Coumadin may increase your risk of developing a new clot or stroke.  Get refills before you run out. ° °What do you do if you miss a dose? °If you miss a dose, take it as soon as you remember on the same day then continue your regularly scheduled  regimen the next day.  Do not take two doses of Coumadin at the same time. ° °Important Safety Information °A possible side effect of Coumadin (Warfarin) is an increased risk of bleeding. You should call your healthcare provider right away if you experience any of the following: °  Bleeding from an injury or your nose that does not stop. °  Unusual colored urine (red or dark brown) or unusual colored stools (red or black). °  Unusual bruising for unknown reasons. °  A serious fall or if you hit your head (even if there is no bleeding). ° °Some foods or medicines interact with Coumadin® (warfarin) and might alter your response to warfarin. To help avoid this: °  Eat a balanced diet, maintaining a consistent amount of Vitamin K. °  Notify your provider about major diet changes you plan to make. °  Avoid alcohol or limit your intake to 1 drink for women and 2 drinks for men per day. °(1 drink is 5 oz. wine, 12 oz. beer, or 1.5 oz. liquor.) ° °Make sure that ANY health care provider who prescribes medication for you knows that you are taking Coumadin (warfarin).  Also make sure the healthcare provider who is monitoring your Coumadin knows when you have started a new medication including herbals and non-prescription products. ° °Coumadin® (Warfarin)  Major Drug Interactions  °Increased Warfarin Effect Decreased Warfarin Effect  °Alcohol (large quantities) °Antibiotics (esp. Septra/Bactrim, Flagyl, Cipro) °Amiodarone (Cordarone) °Aspirin (  ASA) °Cimetidine (Tagamet) °Megestrol (Megace) °NSAIDs (ibuprofen, naproxen, etc.) °Piroxicam (Feldene) °Propafenone (Rythmol SR) °Propranolol (Inderal) °Isoniazid (INH) °Posaconazole (Noxafil) Barbiturates (Phenobarbital) °Carbamazepine (Tegretol) °Chlordiazepoxide (Librium) °Cholestyramine (Questran) °Griseofulvin °Oral Contraceptives °Rifampin °Sucralfate (Carafate) °Vitamin K  ° °Coumadin® (Warfarin) Major Herbal Interactions  °Increased Warfarin Effect Decreased Warfarin Effect    °Garlic °Ginseng °Ginkgo biloba Coenzyme Q10 °Green tea °St. John’s wort   ° °Coumadin® (Warfarin) FOOD Interactions  °Eat a consistent number of servings per week of foods HIGH in Vitamin K °(1 serving = ½ cup)  °Collards (cooked, or boiled & drained) °Kale (cooked, or boiled & drained) °Mustard greens (cooked, or boiled & drained) °Parsley *serving size only = ¼ cup °Spinach (cooked, or boiled & drained) °Swiss chard (cooked, or boiled & drained) °Turnip greens (cooked, or boiled & drained)  °Eat a consistent number of servings per week of foods MEDIUM-HIGH in Vitamin K °(1 serving = 1 cup)  °Asparagus (cooked, or boiled & drained) °Broccoli (cooked, boiled & drained, or raw & chopped) °Brussel sprouts (cooked, or boiled & drained) *serving size only = ½ cup °Lettuce, raw (green leaf, endive, romaine) °Spinach, raw °Turnip greens, raw & chopped  ° °These websites have more information on Coumadin (warfarin):  www.coumadin.com; °www.ahrq.gov/consumer/coumadin.htm; ° ° ° °

## 2015-02-23 DIAGNOSIS — D649 Anemia, unspecified: Secondary | ICD-10-CM | POA: Diagnosis not present

## 2015-02-23 DIAGNOSIS — M6281 Muscle weakness (generalized): Secondary | ICD-10-CM | POA: Diagnosis not present

## 2015-02-23 DIAGNOSIS — E785 Hyperlipidemia, unspecified: Secondary | ICD-10-CM | POA: Diagnosis not present

## 2015-02-23 DIAGNOSIS — L97519 Non-pressure chronic ulcer of other part of right foot with unspecified severity: Secondary | ICD-10-CM | POA: Diagnosis not present

## 2015-02-23 DIAGNOSIS — R488 Other symbolic dysfunctions: Secondary | ICD-10-CM | POA: Diagnosis not present

## 2015-02-23 DIAGNOSIS — E039 Hypothyroidism, unspecified: Secondary | ICD-10-CM | POA: Diagnosis not present

## 2015-02-23 DIAGNOSIS — Z9841 Cataract extraction status, right eye: Secondary | ICD-10-CM | POA: Diagnosis not present

## 2015-02-23 DIAGNOSIS — J45909 Unspecified asthma, uncomplicated: Secondary | ICD-10-CM | POA: Diagnosis not present

## 2015-02-23 DIAGNOSIS — Z6823 Body mass index (BMI) 23.0-23.9, adult: Secondary | ICD-10-CM | POA: Diagnosis not present

## 2015-02-23 DIAGNOSIS — R079 Chest pain, unspecified: Secondary | ICD-10-CM | POA: Diagnosis not present

## 2015-02-23 DIAGNOSIS — E46 Unspecified protein-calorie malnutrition: Secondary | ICD-10-CM | POA: Diagnosis not present

## 2015-02-23 DIAGNOSIS — I5022 Chronic systolic (congestive) heart failure: Secondary | ICD-10-CM | POA: Diagnosis not present

## 2015-02-23 DIAGNOSIS — E43 Unspecified severe protein-calorie malnutrition: Secondary | ICD-10-CM | POA: Insufficient documentation

## 2015-02-23 DIAGNOSIS — S98131A Complete traumatic amputation of one right lesser toe, initial encounter: Secondary | ICD-10-CM | POA: Diagnosis not present

## 2015-02-23 DIAGNOSIS — J449 Chronic obstructive pulmonary disease, unspecified: Secondary | ICD-10-CM | POA: Diagnosis not present

## 2015-02-23 DIAGNOSIS — Z79899 Other long term (current) drug therapy: Secondary | ICD-10-CM | POA: Diagnosis not present

## 2015-02-23 DIAGNOSIS — I10 Essential (primary) hypertension: Secondary | ICD-10-CM | POA: Diagnosis not present

## 2015-02-23 DIAGNOSIS — E038 Other specified hypothyroidism: Secondary | ICD-10-CM | POA: Diagnosis not present

## 2015-02-23 DIAGNOSIS — R279 Unspecified lack of coordination: Secondary | ICD-10-CM | POA: Diagnosis not present

## 2015-02-23 DIAGNOSIS — M1990 Unspecified osteoarthritis, unspecified site: Secondary | ICD-10-CM | POA: Diagnosis not present

## 2015-02-23 DIAGNOSIS — E568 Deficiency of other vitamins: Secondary | ICD-10-CM | POA: Diagnosis not present

## 2015-02-23 DIAGNOSIS — M86671 Other chronic osteomyelitis, right ankle and foot: Secondary | ICD-10-CM | POA: Diagnosis not present

## 2015-02-23 DIAGNOSIS — Z87891 Personal history of nicotine dependence: Secondary | ICD-10-CM | POA: Diagnosis not present

## 2015-02-23 DIAGNOSIS — M79676 Pain in unspecified toe(s): Secondary | ICD-10-CM | POA: Diagnosis present

## 2015-02-23 DIAGNOSIS — Z4781 Encounter for orthopedic aftercare following surgical amputation: Secondary | ICD-10-CM | POA: Diagnosis not present

## 2015-02-23 DIAGNOSIS — G8929 Other chronic pain: Secondary | ICD-10-CM | POA: Diagnosis not present

## 2015-02-23 DIAGNOSIS — R262 Difficulty in walking, not elsewhere classified: Secondary | ICD-10-CM | POA: Diagnosis not present

## 2015-02-23 DIAGNOSIS — Z7901 Long term (current) use of anticoagulants: Secondary | ICD-10-CM | POA: Diagnosis not present

## 2015-02-23 DIAGNOSIS — K219 Gastro-esophageal reflux disease without esophagitis: Secondary | ICD-10-CM | POA: Diagnosis not present

## 2015-02-23 DIAGNOSIS — Z9842 Cataract extraction status, left eye: Secondary | ICD-10-CM | POA: Diagnosis not present

## 2015-02-23 DIAGNOSIS — I252 Old myocardial infarction: Secondary | ICD-10-CM | POA: Diagnosis not present

## 2015-02-23 DIAGNOSIS — Z85828 Personal history of other malignant neoplasm of skin: Secondary | ICD-10-CM | POA: Diagnosis not present

## 2015-02-23 DIAGNOSIS — I2510 Atherosclerotic heart disease of native coronary artery without angina pectoris: Secondary | ICD-10-CM | POA: Diagnosis not present

## 2015-02-23 DIAGNOSIS — I509 Heart failure, unspecified: Secondary | ICD-10-CM | POA: Diagnosis not present

## 2015-02-23 DIAGNOSIS — I482 Chronic atrial fibrillation: Secondary | ICD-10-CM | POA: Diagnosis not present

## 2015-02-23 DIAGNOSIS — Z23 Encounter for immunization: Secondary | ICD-10-CM | POA: Diagnosis not present

## 2015-02-23 DIAGNOSIS — Z7982 Long term (current) use of aspirin: Secondary | ICD-10-CM | POA: Diagnosis not present

## 2015-02-23 DIAGNOSIS — R197 Diarrhea, unspecified: Secondary | ICD-10-CM | POA: Diagnosis not present

## 2015-02-23 DIAGNOSIS — L03115 Cellulitis of right lower limb: Secondary | ICD-10-CM | POA: Diagnosis not present

## 2015-02-23 DIAGNOSIS — E876 Hypokalemia: Secondary | ICD-10-CM | POA: Diagnosis not present

## 2015-02-23 DIAGNOSIS — Z8673 Personal history of transient ischemic attack (TIA), and cerebral infarction without residual deficits: Secondary | ICD-10-CM | POA: Diagnosis not present

## 2015-02-23 DIAGNOSIS — Z961 Presence of intraocular lens: Secondary | ICD-10-CM | POA: Diagnosis not present

## 2015-02-23 DIAGNOSIS — I4891 Unspecified atrial fibrillation: Secondary | ICD-10-CM | POA: Diagnosis not present

## 2015-02-23 DIAGNOSIS — E440 Moderate protein-calorie malnutrition: Secondary | ICD-10-CM | POA: Diagnosis not present

## 2015-02-23 DIAGNOSIS — M869 Osteomyelitis, unspecified: Secondary | ICD-10-CM | POA: Diagnosis not present

## 2015-02-23 LAB — PROTIME-INR
INR: 3.52 — AB (ref 0.00–1.49)
Prothrombin Time: 35.6 seconds — ABNORMAL HIGH (ref 11.6–15.2)

## 2015-02-23 LAB — CLOSTRIDIUM DIFFICILE BY PCR: CDIFFPCR: NEGATIVE

## 2015-02-23 MED ORDER — TRAMADOL HCL 50 MG PO TABS: 50.0000 mg | ORAL_TABLET | Freq: Four times a day (QID) | ORAL | Status: DC | PRN

## 2015-02-23 MED ORDER — ENSURE ENLIVE PO LIQD: 237.0000 mL | Freq: Two times a day (BID) | ORAL | Status: DC

## 2015-02-23 MED ORDER — METOPROLOL SUCCINATE ER 50 MG PO TB24: 50.0000 mg | ORAL_TABLET | Freq: Every day | ORAL | Status: DC

## 2015-02-23 MED ORDER — HYDROCODONE-ACETAMINOPHEN 5-325 MG PO TABS: 1.0000 | ORAL_TABLET | Freq: Four times a day (QID) | ORAL | Status: DC | PRN

## 2015-02-23 MED ORDER — DOXYCYCLINE HYCLATE 100 MG PO TABS: 100.0000 mg | ORAL_TABLET | Freq: Two times a day (BID) | ORAL | Status: DC

## 2015-02-23 MED ORDER — METOPROLOL SUCCINATE ER 50 MG PO TB24
50.0000 mg | ORAL_TABLET | Freq: Every day | ORAL | Status: DC
  Administered 2015-02-23: 50 mg via ORAL
  Filled 2015-02-23: qty 1

## 2015-02-23 MED ORDER — PNEUMOCOCCAL VAC POLYVALENT 25 MCG/0.5ML IJ INJ: 0.5000 mL | INJECTION | Freq: Once | INTRAMUSCULAR | Status: DC

## 2015-02-23 MED ORDER — LOPERAMIDE HCL 2 MG PO CAPS
2.0000 mg | ORAL_CAPSULE | ORAL | Status: DC | PRN
  Filled 2015-02-23: qty 1

## 2015-02-23 MED ORDER — LOPERAMIDE HCL 2 MG PO CAPS: 2.0000 mg | ORAL_CAPSULE | ORAL | Status: DC | PRN

## 2015-02-23 NOTE — Progress Notes (Signed)
Physical Therapy Treatment Patient Details Name: [name] [name] MRN: [id number] DOB: 01/17/1925 Today's Date: 02/23/2015    History of Present Illness Pt is a 81 y.o. 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     PT Comments    Pt unable to ambulate while maintaining TWB status. Performed SPT to chair with mod to max (A) and max cueing. Cont to recommend SNF upon acute D/C. Pt hopeful wife can be placed as well.   Follow Up Recommendations  SNF     Equipment Recommendations  Rolling walker with 5" wheels    Recommendations for Other Services       Precautions / Restrictions Precautions Precautions: Fall Required Braces or Orthoses: Other Brace/Splint Other Brace/Splint: post op shoe Restrictions Weight Bearing Restrictions: Yes RLE Weight Bearing: Touchdown weight bearing    Mobility  Bed Mobility Overal bed mobility: Modified Independent             General bed mobility comments: HOB up and relying on handrails  Transfers Overall transfer level: Needs assistance Equipment used: Rolling walker (2 wheeled) Transfers: Sit to/from [facility] Sit to Stand: Mod assist;From elevated surface Stand pivot transfers: Max assist       General transfer comment: pt with difficulty maintaining TWB status; max cues for safety and hand placement; pt attempting to sit prior to reaching chair  Ambulation/Gait             General Gait Details: pivot only   Stairs            Wheelchair Mobility    Modified Rankin (Stroke Patients Only)       Balance                                    Cognition Arousal/Alertness: Awake/alert Behavior During Therapy: WFL for tasks assessed/performed Overall Cognitive Status: Within Functional Limits for tasks assessed       Memory: Decreased recall of precautions              Exercises General Exercises - Lower Extremity Long Arc Quad: AROM;Both;10 reps    General Comments         Pertinent Vitals/Pain Pain Assessment: No/denies pain    Home Living                      Prior Function            PT Goals (current goals can now be found in the care plan section) Acute Rehab PT Goals Patient Stated Goal: to get back to my wife PT Goal Formulation: With patient Time For Goal Achievement: 03/01/15 Potential to Achieve Goals: Fair Progress towards PT goals: Progressing toward goals    Frequency  Min 4X/week    PT Plan Current plan remains appropriate    Co-evaluation             End of Session Equipment Utilized During Treatment: Gait belt;Other (comment) (post op shoe) Activity Tolerance: Patient limited by fatigue Patient left: in chair;with call bell/phone within reach;with chair alarm set     Time: 5993-5701 PT Time Calculation (min) (ACUTE ONLY): 13 min  Charges:  $Therapeutic Activity: 8-22 mins                    G CodesGustavus Bryant PT [phone] 02/23/2015, 8:23 AM

## 2015-02-23 NOTE — Discharge Summary (Signed)
Physician Discharge Summary  [name] [name]:[id number] DOB: March 11, 1925 DOA: 02/17/2015  PCP: [name], MD  Admit date: 02/17/2015 Discharge date: 02/23/2015  Recommendations for Outpatient Follow-up:  1. Pt will need to follow up with PCP in 2 weeks post discharge 2. Please obtain BMP and CBC on 02/26/15 3. Check INR on 02/26/15 and adjust coumadin dose for INR target of 2-3 4. Hold coumadin (do not give) on 02/23/15  Discharge Diagnoses:  Osteomyelitis of the right third toe/Cellulitis R-foot Status post reamputation of the right third toe on 3/30. [name] [name] tolerated surgery well. On empiric IV antibiotic. [name] [name] continued on IV antibiotics until the day of discharge (vancomycin and Zosyn) -[name] [name] will be discharged with doxycycline 100 mg twice a day for 4 additional days to cover for the [name] [name]'s soft tissue infection--this will complete 10 days of therapy altogether -Continue dressing as instructed by orthopedics--keep pt in present dressing until seen by Dr. Sharol Given in office -add when necessary Vicodin for pain as [name] [name] only on Tylenol and pain not controlled. -Seen by physical therapy. [name] [name] has difficulty maintaining total weight dating at this time. PT recommend skilled nursing facility. [name] [name] agrees for this. Social worker consulted.  Chronic A. fib Rate controlled. Continue Coumadin. INR therapeutic. And tinnitus toxin. Slight hypokalemia--Replenished -[name] [name] will continue on warfarin for secondary stroke prophylaxis -Please check INR on 02/26/2015 and adjust Coumadin dose accordingly for INR 2-3 -as INR is 3.52--hold coumadin on 02/23/15, resume home dosing 02/24/15 -[name] [name] takes Coumadin 5 mg Monday, Tuesday, Wednesday, Saturday, Sunday and 7.5 mg on Thursday and Friday Chronic systolic CHF Euvolemic. Continue home dose Lasix. -As [name] [name] is bradycardic and has soft blood pressures, decreased metoprolol succinate to once daily  Hyperlipidemia Continue  statin  Normocytic anemia Hemoglobin around baseline.  Protein calorie malnutrition Dietitian consulted  Diarrhea -C. difficile PCR is negative -Imodium when necessary with maximum 8 mg daily  Discharge Condition: stable    Disposition: SNF Follow-up Information    Follow up with DUDA,MARCUS V, MD In 2 weeks.   Specialty:  Orthopedic Surgery   Contact information:   [place] [address] [phone]       Diet:heart healthy Wt Readings from Last 3 Encounters:  02/17/15 77.111 kg (170 lb)  01/01/15 75.751 kg (167 lb)  10/12/14 77.111 kg (170 lb)    History of present illness:  81 year old male with history of A. fib on Coumadin, history of TIA, CAD presenting with worsened redness with pain and swelling of the right third toe following recent callus removal by podiatry. MRI of the foot showed a semi-otitis involving the middle and distal phalanx of the third toe with possible septic arthritis involving the interphalangeal joint along with diffuse cellulitis. He was seen by orthopedics and underwent ray amputation of the right third toe on 3/30.  Consultants: Ortho--Dr. Sharol Given  Discharge Exam: Filed Vitals:   02/23/15 0533  BP: 106/53  Pulse: 51  Temp: 98.7 F (37.1 C)  Resp: 15   Filed Vitals:   02/22/15 2014 02/22/15 2016 02/23/15 0054 02/23/15 0533  BP: 95/58   106/53  Pulse: 52 73  51  Temp: 98 F (36.7 C)   98.7 F (37.1 C)  TempSrc: Oral   Oral  Resp: 17  16 15   Height:      Weight:      SpO2: 98%   100%   General: A&O x 3, NAD, pleasant, cooperative Cardiovascular: IRRR, no rub, no gallop, no S3 Respiratory: CTAB,  no wheeze, no rhonchi Abdomen:soft, nontender, nondistended, positive bowel sounds Extremities: No edema, No lymphangitis, no petechiae  Discharge Instructions      Discharge Instructions    Change dressing    Complete by:  As directed   Change dressing as needed.     Diet - low sodium heart healthy     Complete by:  As directed      Increase activity slowly    Complete by:  As directed      Touch down weight bearing    Complete by:  As directed   Laterality:  right  Extremity:  Lower            Medication List    STOP taking these medications        HYDROcodone-homatropine 5-1.5 MG/5ML syrup  Commonly known as:  HYCODAN      TAKE these medications        aspirin 81 MG tablet  Take 81 mg by mouth at bedtime.     atorvastatin 10 MG tablet  Commonly known as:  LIPITOR  TAKE 1 TABLET AT BEDTIME     CALTRATE 600 PLUS-VIT D PO  Take 1 tablet by mouth at bedtime.     doxycycline 100 MG tablet  Commonly known as:  VIBRA-TABS  Take 1 tablet (100 mg total) by mouth every 12 (twelve) hours.     feeding supplement (ENSURE ENLIVE) Liqd  Take 237 mLs by mouth 2 (two) times daily between meals.     furosemide 40 MG tablet  Commonly known as:  LASIX  Take 1 tablet (40 mg total) by mouth daily.     HYDROcodone-acetaminophen 5-325 MG per tablet  Commonly known as:  NORCO/VICODIN  Take 1-2 tablets by mouth every 6 (six) hours as needed for moderate pain.     LANOXIN 0.125 MG tablet  Generic drug:  digoxin  TAKE 1 TABLET DAILY     levothyroxine 100 MCG tablet  Commonly known as:  SYNTHROID, LEVOTHROID  TAKE 1 TABLET DAILY     loperamide 2 MG capsule  Commonly known as:  IMODIUM  Take 1 capsule (2 mg total) by mouth as needed for diarrhea or loose stools (max 8 mg daily).     methotrexate 2.5 MG tablet  Commonly known as:  RHEUMATREX  Take 10 mg by mouth once a week. Monday.     metoprolol succinate 50 MG 24 hr tablet  Commonly known as:  TOPROL-XL  Take 1 tablet (50 mg total) by mouth daily. Take with or immediately following a meal.     multivitamin with minerals Tabs tablet  Take 1 tablet by mouth daily.     nitroGLYCERIN 0.4 MG SL tablet  Commonly known as:  NITROSTAT  Place 1 tablet (0.4 mg total) under the tongue every 5 (five) minutes as needed for chest  pain.     traMADol 50 MG tablet  Commonly known as:  ULTRAM  Take 1 tablet (50 mg total) by mouth every 6 (six) hours as needed for moderate pain. Take 1-2 tablets by mouth daily as needed for arthritis     warfarin 5 MG tablet  Commonly known as:  COUMADIN  Take as directed by anticoagulation clinic.         The results of significant diagnostics from this hospitalization (including imaging, microbiology, ancillary and laboratory) are listed below for reference.    Significant Diagnostic Studies: Dg Chest 1 View  02/18/2015   CLINICAL DATA:  Congestive heart  failure  EXAM: CHEST  1 VIEW  COMPARISON:  05/19/2014  FINDINGS: Cardiomegaly with central vascular congestion noted and a few peripheral interstitial Kerley B-lines. Trace, if any, pleural effusions. No focal pulmonary opacity. No acute osseous abnormality.  IMPRESSION: Cardiomegaly with minimal interstitial pulmonary edema.   Electronically Signed   By: Conchita Paris M.D.   On: 02/18/2015 08:49   Mr Foot Right W Wo Contrast  02/18/2015   CLINICAL DATA:  Pain, swelling and redness involving the right third toe. Recent callus removal approximately 4 weeks ago. X-ray findings suspicious for osteomyelitis.  EXAM: MRI OF THE RIGHT FOREFOOT WITHOUT AND WITH CONTRAST  TECHNIQUE: Multiplanar, multisequence MR imaging was performed both before and after administration of intravenous contrast.  CONTRAST:  67mL MULTIHANCE GADOBENATE DIMEGLUMINE 529 MG/ML IV SOLN  COMPARISON:  Radiographs 02/17/2015  FINDINGS: There is an open wound involving the dorsal aspect of the distal third toe with significant surrounding cellulitis. There is osteomyelitis involving the middle and distal phalanges with probable intervening septic arthritis at the interphalangeal joint. The proximal phalanx is intact. The other bony structures of the forefoot are intact. The other joints are intact.  There is diffuse cellulitis and myofasciitis but no discrete drainable soft  tissue abscess or pyomyositis.  IMPRESSION: 1. Osteomyelitis involving the middle and distal phalanges of the third toe. Probable septic arthritis involving the interphalangeal joint. The proximal phalanx is not affected. 2. Diffuse cellulitis and myofasciitis but no findings for discrete drainable soft tissue abscess or pyomyositis.   Electronically Signed   By: Marijo Sanes M.D.   On: 02/18/2015 12:32   Dg Foot Complete Right  02/17/2015   CLINICAL DATA:  81 year old male with history of callus removal from the right middle toe 1 month ago now presenting with redness, swelling, pain in drainage from the middle and fourth toes.  EXAM: RIGHT FOOT COMPLETE - 3+ VIEW  COMPARISON:  No priors.  FINDINGS: Osteolysis of the third distal phalangeal tuft. Overlying soft tissues appear markedly irregular. Remaining bones appear intact. No acute displaced fracture, subluxation or dislocation.  IMPRESSION: Osteolysis of the tuft of the third distal phalanx, concerning for osteomyelitis. Overlying soft tissues also appear markedly swollen, suggesting soft tissue infection as well.   Electronically Signed   By: Vinnie Langton M.D.   On: 02/17/2015 21:05     Microbiology: Recent Results (from the past 240 hour(s))  Blood culture (routine x 2)     Status: None (Preliminary result)   Collection Time: 02/17/15  8:05 PM  Result Value Ref Range Status   Specimen Description BLOOD RIGHT ARM  Final   Special Requests BOTTLES DRAWN AEROBIC AND ANAEROBIC 10CC EACH  Final   Culture   Final           BLOOD CULTURE RECEIVED NO GROWTH TO DATE CULTURE WILL BE HELD FOR 5 DAYS BEFORE ISSUING A FINAL NEGATIVE REPORT Note: Culture results may be compromised due to an excessive volume of blood received in culture bottles. Performed at [facility]    Report Status PENDING  Incomplete  Blood culture (routine x 2)     Status: None (Preliminary result)   Collection Time: 02/17/15  8:05 PM  Result Value Ref Range Status    Specimen Description BLOOD LEFT ARM  Final   Special Requests BOTTLES DRAWN AEROBIC AND ANAEROBIC 5CC EACH  Final   Culture   Final           BLOOD CULTURE RECEIVED NO GROWTH TO  DATE CULTURE WILL BE HELD FOR 5 DAYS BEFORE ISSUING A FINAL NEGATIVE REPORT Performed at [facility]    Report Status PENDING  Incomplete  Surgical pcr screen     Status: None   Collection Time: 02/21/15 11:35 AM  Result Value Ref Range Status   MRSA, PCR NEGATIVE NEGATIVE Final   Staphylococcus aureus NEGATIVE NEGATIVE Final    Comment:        The Xpert SA Assay (FDA approved for NASAL specimens in patients over 61 years of age), is one component of a comprehensive surveillance program.  Test performance has been validated by [facility] for patients greater than or equal to 86 year old. It is not intended to diagnose infection nor to guide or monitor treatment.   Clostridium Difficile by PCR     Status: None   Collection Time: 02/23/15  5:56 AM  Result Value Ref Range Status   C difficile by pcr NEGATIVE NEGATIVE Final     Labs: Basic Metabolic Panel:  Recent Labs Lab 02/17/15 2000 02/18/15 0421 02/19/15 0547 02/20/15 0520 02/21/15 0445  NA 138 138 136 136 135  K 3.3* 3.7 3.5 3.3* 3.6  CL 99 100 105 100 100  CO2 33* 30 28 29 31   GLUCOSE 91 105* 98 93 90  BUN 8 <5* <5* <5* 6  CREATININE 0.87 0.80 0.82 0.91 1.05  CALCIUM 8.6 8.3* 8.2* 8.5 8.6   Liver Function Tests:  Recent Labs Lab 02/17/15 2000 02/18/15 0421  AST 29 27  ALT 16 14  ALKPHOS 70 62  BILITOT 0.1* 0.5  PROT 6.3 5.3*  ALBUMIN 3.7 2.8*   No results for input(s): LIPASE, AMYLASE in the last 168 hours. No results for input(s): AMMONIA in the last 168 hours. CBC:  Recent Labs Lab 02/17/15 2000 02/18/15 0421 02/18/15 0809 02/19/15 0547 02/20/15 0520  WBC 6.1 5.8 6.0 13.5* 9.0  NEUTROABS 4.0  --   --   --   --   HGB 10.0* 9.2* 9.6* 9.4* 10.8*  HCT 30.9* 27.9* 29.7* 28.9* 32.5*  MCV 93.9 92.4 92.2  92.0 90.8  PLT 253 213 240 215 249   Cardiac Enzymes: No results for input(s): CKTOTAL, CKMB, CKMBINDEX, TROPONINI in the last 168 hours. BNP: Invalid input(s): POCBNP CBG: No results for input(s): GLUCAP in the last 168 hours.  Time coordinating discharge:  Greater than 30 minutes  Signed:  Torre Pikus, DO [facility] Pager: [phone] 02/23/2015, 10:08 AM

## 2015-02-23 NOTE — Progress Notes (Signed)
Spoke with daughter to inform her that her father is ready to be discharged. She will be here to pick him up at approximately 14:00.

## 2015-02-23 NOTE — Discharge Planning (Signed)
Patient will discharge today per MD order. Patient will discharge to [facility]  RN to call report prior to transportation to [phone] Transportation: Family, daughter, [name] (RN to coordinate time of transportation with daughter as patient has medications that is needed prior to transportation)  CSW sent discharge summary to SNF for review.  Packet is complete.  RN, patient and family aware of discharge plans.  Nonnie Done, [place] [phone]  Psychiatric & Orthopedics (5N 1-16) Clinical Social Worker

## 2015-02-23 NOTE — Progress Notes (Signed)
Report called to [place] at [facility].

## 2015-02-24 LAB — CULTURE, BLOOD (ROUTINE X 2)
Culture: NO GROWTH
Culture: NO GROWTH

## 2015-02-25 DIAGNOSIS — E039 Hypothyroidism, unspecified: Secondary | ICD-10-CM | POA: Diagnosis not present

## 2015-02-25 DIAGNOSIS — S98131A Complete traumatic amputation of one right lesser toe, initial encounter: Secondary | ICD-10-CM | POA: Diagnosis not present

## 2015-02-25 DIAGNOSIS — I509 Heart failure, unspecified: Secondary | ICD-10-CM | POA: Diagnosis not present

## 2015-02-25 DIAGNOSIS — I4891 Unspecified atrial fibrillation: Secondary | ICD-10-CM | POA: Diagnosis not present

## 2015-03-01 ENCOUNTER — Ambulatory Visit: Payer: Medicare Other

## 2015-03-25 DIAGNOSIS — R488 Other symbolic dysfunctions: Secondary | ICD-10-CM | POA: Diagnosis not present

## 2015-03-25 DIAGNOSIS — E440 Moderate protein-calorie malnutrition: Secondary | ICD-10-CM | POA: Diagnosis not present

## 2015-03-25 DIAGNOSIS — M869 Osteomyelitis, unspecified: Secondary | ICD-10-CM | POA: Diagnosis not present

## 2015-03-25 DIAGNOSIS — I10 Essential (primary) hypertension: Secondary | ICD-10-CM | POA: Diagnosis not present

## 2015-03-25 DIAGNOSIS — L03115 Cellulitis of right lower limb: Secondary | ICD-10-CM | POA: Diagnosis not present

## 2015-03-25 DIAGNOSIS — E038 Other specified hypothyroidism: Secondary | ICD-10-CM | POA: Diagnosis not present

## 2015-03-25 DIAGNOSIS — R279 Unspecified lack of coordination: Secondary | ICD-10-CM | POA: Diagnosis not present

## 2015-03-25 DIAGNOSIS — M6281 Muscle weakness (generalized): Secondary | ICD-10-CM | POA: Diagnosis not present

## 2015-03-25 DIAGNOSIS — I509 Heart failure, unspecified: Secondary | ICD-10-CM | POA: Diagnosis not present

## 2015-03-25 DIAGNOSIS — E568 Deficiency of other vitamins: Secondary | ICD-10-CM | POA: Diagnosis not present

## 2015-03-25 DIAGNOSIS — E876 Hypokalemia: Secondary | ICD-10-CM | POA: Diagnosis not present

## 2015-03-25 DIAGNOSIS — R079 Chest pain, unspecified: Secondary | ICD-10-CM | POA: Diagnosis not present

## 2015-03-25 DIAGNOSIS — R197 Diarrhea, unspecified: Secondary | ICD-10-CM | POA: Diagnosis not present

## 2015-03-25 DIAGNOSIS — Z4781 Encounter for orthopedic aftercare following surgical amputation: Secondary | ICD-10-CM | POA: Diagnosis not present

## 2015-03-25 DIAGNOSIS — G8929 Other chronic pain: Secondary | ICD-10-CM | POA: Diagnosis not present

## 2015-03-25 DIAGNOSIS — M1990 Unspecified osteoarthritis, unspecified site: Secondary | ICD-10-CM | POA: Diagnosis not present

## 2015-03-25 DIAGNOSIS — E785 Hyperlipidemia, unspecified: Secondary | ICD-10-CM | POA: Diagnosis not present

## 2015-03-25 DIAGNOSIS — R262 Difficulty in walking, not elsewhere classified: Secondary | ICD-10-CM | POA: Diagnosis not present

## 2015-03-25 DIAGNOSIS — I4891 Unspecified atrial fibrillation: Secondary | ICD-10-CM | POA: Diagnosis not present

## 2015-03-30 ENCOUNTER — Telehealth: Payer: Self-pay | Admitting: Family Medicine

## 2015-03-30 NOTE — Telephone Encounter (Signed)
Danella Maiers lpn/psc is calling to let md know  start of care will start on 5/8 per daughter request

## 2015-04-02 DIAGNOSIS — Z4801 Encounter for change or removal of surgical wound dressing: Secondary | ICD-10-CM | POA: Diagnosis not present

## 2015-04-02 DIAGNOSIS — I4891 Unspecified atrial fibrillation: Secondary | ICD-10-CM | POA: Diagnosis not present

## 2015-04-02 DIAGNOSIS — Z5181 Encounter for therapeutic drug level monitoring: Secondary | ICD-10-CM | POA: Diagnosis not present

## 2015-04-02 DIAGNOSIS — M6281 Muscle weakness (generalized): Secondary | ICD-10-CM | POA: Diagnosis not present

## 2015-04-02 DIAGNOSIS — Z7901 Long term (current) use of anticoagulants: Secondary | ICD-10-CM | POA: Diagnosis not present

## 2015-04-02 DIAGNOSIS — Z87891 Personal history of nicotine dependence: Secondary | ICD-10-CM | POA: Diagnosis not present

## 2015-04-02 DIAGNOSIS — J449 Chronic obstructive pulmonary disease, unspecified: Secondary | ICD-10-CM | POA: Diagnosis not present

## 2015-04-02 DIAGNOSIS — I5022 Chronic systolic (congestive) heart failure: Secondary | ICD-10-CM | POA: Diagnosis not present

## 2015-04-02 DIAGNOSIS — Z4781 Encounter for orthopedic aftercare following surgical amputation: Secondary | ICD-10-CM | POA: Diagnosis not present

## 2015-04-02 DIAGNOSIS — I2510 Atherosclerotic heart disease of native coronary artery without angina pectoris: Secondary | ICD-10-CM | POA: Diagnosis not present

## 2015-04-02 DIAGNOSIS — R1310 Dysphagia, unspecified: Secondary | ICD-10-CM | POA: Diagnosis not present

## 2015-04-02 DIAGNOSIS — I252 Old myocardial infarction: Secondary | ICD-10-CM | POA: Diagnosis not present

## 2015-04-02 DIAGNOSIS — Z8673 Personal history of transient ischemic attack (TIA), and cerebral infarction without residual deficits: Secondary | ICD-10-CM | POA: Diagnosis not present

## 2015-04-02 DIAGNOSIS — I10 Essential (primary) hypertension: Secondary | ICD-10-CM | POA: Diagnosis not present

## 2015-04-02 DIAGNOSIS — Z89421 Acquired absence of other right toe(s): Secondary | ICD-10-CM | POA: Diagnosis not present

## 2015-04-04 DIAGNOSIS — R1310 Dysphagia, unspecified: Secondary | ICD-10-CM | POA: Diagnosis not present

## 2015-04-04 DIAGNOSIS — I10 Essential (primary) hypertension: Secondary | ICD-10-CM | POA: Diagnosis not present

## 2015-04-04 DIAGNOSIS — I2510 Atherosclerotic heart disease of native coronary artery without angina pectoris: Secondary | ICD-10-CM | POA: Diagnosis not present

## 2015-04-04 DIAGNOSIS — I5022 Chronic systolic (congestive) heart failure: Secondary | ICD-10-CM | POA: Diagnosis not present

## 2015-04-04 DIAGNOSIS — I4891 Unspecified atrial fibrillation: Secondary | ICD-10-CM | POA: Diagnosis not present

## 2015-04-04 DIAGNOSIS — Z4781 Encounter for orthopedic aftercare following surgical amputation: Secondary | ICD-10-CM | POA: Diagnosis not present

## 2015-04-05 ENCOUNTER — Telehealth: Payer: Self-pay | Admitting: General Practice

## 2015-04-05 NOTE — Telephone Encounter (Signed)
Verbal order given to Eustaquio Maize, RN @ [facility] to check patient's INR on Monday 5/16.

## 2015-04-06 DIAGNOSIS — I10 Essential (primary) hypertension: Secondary | ICD-10-CM | POA: Diagnosis not present

## 2015-04-06 DIAGNOSIS — Z4781 Encounter for orthopedic aftercare following surgical amputation: Secondary | ICD-10-CM | POA: Diagnosis not present

## 2015-04-06 DIAGNOSIS — R1310 Dysphagia, unspecified: Secondary | ICD-10-CM | POA: Diagnosis not present

## 2015-04-06 DIAGNOSIS — I2510 Atherosclerotic heart disease of native coronary artery without angina pectoris: Secondary | ICD-10-CM | POA: Diagnosis not present

## 2015-04-06 DIAGNOSIS — I5022 Chronic systolic (congestive) heart failure: Secondary | ICD-10-CM | POA: Diagnosis not present

## 2015-04-06 DIAGNOSIS — I4891 Unspecified atrial fibrillation: Secondary | ICD-10-CM | POA: Diagnosis not present

## 2015-04-09 ENCOUNTER — Ambulatory Visit (INDEPENDENT_AMBULATORY_CARE_PROVIDER_SITE_OTHER): Payer: Medicare Other | Admitting: General Practice

## 2015-04-09 DIAGNOSIS — I10 Essential (primary) hypertension: Secondary | ICD-10-CM | POA: Diagnosis not present

## 2015-04-09 DIAGNOSIS — Z5181 Encounter for therapeutic drug level monitoring: Secondary | ICD-10-CM

## 2015-04-09 DIAGNOSIS — I4891 Unspecified atrial fibrillation: Secondary | ICD-10-CM | POA: Diagnosis not present

## 2015-04-09 DIAGNOSIS — I5022 Chronic systolic (congestive) heart failure: Secondary | ICD-10-CM | POA: Diagnosis not present

## 2015-04-09 DIAGNOSIS — R1310 Dysphagia, unspecified: Secondary | ICD-10-CM | POA: Diagnosis not present

## 2015-04-09 DIAGNOSIS — Z4781 Encounter for orthopedic aftercare following surgical amputation: Secondary | ICD-10-CM | POA: Diagnosis not present

## 2015-04-09 DIAGNOSIS — I2510 Atherosclerotic heart disease of native coronary artery without angina pectoris: Secondary | ICD-10-CM | POA: Diagnosis not present

## 2015-04-09 LAB — POCT INR: INR: 1.5

## 2015-04-09 NOTE — Progress Notes (Signed)
Pre visit review using our clinic review tool, if applicable. No additional management support is needed unless otherwise documented below in the visit note. 

## 2015-04-10 ENCOUNTER — Encounter: Payer: Self-pay | Admitting: Cardiology

## 2015-04-10 ENCOUNTER — Ambulatory Visit (INDEPENDENT_AMBULATORY_CARE_PROVIDER_SITE_OTHER): Payer: Medicare Other | Admitting: Cardiology

## 2015-04-10 ENCOUNTER — Telehealth: Payer: Self-pay | Admitting: Cardiology

## 2015-04-10 VITALS — BP 122/50 | HR 58 | Ht 72.0 in | Wt 157.0 lb

## 2015-04-10 DIAGNOSIS — I4891 Unspecified atrial fibrillation: Secondary | ICD-10-CM

## 2015-04-10 DIAGNOSIS — I2510 Atherosclerotic heart disease of native coronary artery without angina pectoris: Secondary | ICD-10-CM

## 2015-04-10 DIAGNOSIS — I350 Nonrheumatic aortic (valve) stenosis: Secondary | ICD-10-CM

## 2015-04-10 DIAGNOSIS — I5022 Chronic systolic (congestive) heart failure: Secondary | ICD-10-CM

## 2015-04-10 NOTE — Progress Notes (Signed)
Patient ID: [name] [name], male   DOB: 1925-11-14, 81 y.o.   MRN: [id number]    Patient Name: [name] [name] Date of Encounter: 04/10/2015  Primary Care Provider:  [name], MD Primary Cardiologist:  Dorothy Spark (formerly Dr. Verl Blalock)  Problem List   Past Medical History  Diagnosis Date  . Carotid artery occlusion     right total internal artery occlusion  . Coronary artery disease     s/p cardiac cath in 1990s, and cardiolite study  in 2005 showing EF 54%  . Hyperlipidemia   . Hypertension   . TIA (transient ischemic attack)   . Degenerative disc disease     cervical spine  . Arrhythmia   . Atrial fibrillation 03/2011  . Myocardial infarction 1992 and 1994  . Angina     "jaw & elbow was the worse"  . Pneumonia ~ 1935  . GERD (gastroesophageal reflux disease)   . Skin cancer     forehead  . Urgency incontinence   . Bilateral cellulitis of lower leg    Past Surgical History  Procedure Laterality Date  . Cataract extraction w/ intraocular lens  implant, bilateral    . Cardiac catheterization  1990s  . Skin cancer excision  12/2011    forehead  . Amputation Right 02/21/2015    Procedure: Right 3rd Ray Amputation;  Surgeon: Newt Minion, MD;  Location: [facility];  Service: Orthopedics;  Laterality: Right;  Digital block and ankle block with MAC    Allergies  Allergies  Allergen Reactions  . Levofloxacin Rash    "thought they were sticking swords thru me"  . Klor-Con [Potassium Chloride Er] Diarrhea    DIARRHEA    HPI  Mr. [name] returns today for evaluation and management of his history of coronary artery disease, history of chronic A. fib, anticoagulation. He also has carotid artery disease which is followed by the vascular surgery group.  He denies any symptoms of TIAs. His last sono carotid on 09/12/14 was unchanged from prior, with chronic right ICA occlusion and  < 40% stenosis in left ICA. He still very active doing all of his yard work. He was  scraping some snow this morning. He denies any chest pain, shortness of breath, palpitations presyncope or syncope. He denies any bleeding or melena. He is overdue for blood work.  07/10/2014 - no complaints, no more chest pain, SOB or palpitations. Stable LE edema.   10/12/2014 - the patient is coming for 3 months follow-up, he continues to have same all over extremity edema. He is unable to prolonged compression stockings. But he is veering on a regular stocking they're giving him significant compression. Patient states that he spends most of his stay on his feet as he is working around the house and he is taking care of his wife with Alzheimer. He is able to lay flat at night and denies any angina, dyspnea on exertion, orthopnea or paroxysmal nocturnal dyspnea.  06/25/2014 - the patient is coming after 3 months, at the last visit we increased his Lasix to 40 mg daily. And we started him on potassium supplements as his potassium was 3.2. The patient states that his lower extremity edema has slightly improved. He is hard time swallowing large potassium pills. He denies any shortness of breath, chest pain palpitations or syncope. He has been married to his wife for 63 years she currently has severe Alzheimer and she is her primary caretaker.  04/10/15 - 9 months  follow up, the patient states that he feels well, he continues to have LE edema, however improved, he is not able to use compression stockings as he cont put them on. He denies CP, orthopnea, PND, has stable DOE. He underwent a toe amputation by podiatry and just completed an inpatient rehab. He can walk with no difficulties, denies falls, no bleeding.   Home Medications  Prior to Admission medications   Medication Sig Start Date End Date Taking? Authorizing Provider  aspirin 81 MG tablet Take 81 mg by mouth at bedtime.     Historical Provider, MD  atorvastatin (LIPITOR) 10 MG tablet TAKE 1 TABLET AT BEDTIME 10/03/13   Dorothy Spark, MD    Calcium-Vitamin D (CALTRATE 600 PLUS-VIT D PO) Take 1 tablet by mouth at bedtime.     Historical Provider, MD  digoxin (LANOXIN) 0.125 MG tablet Take 0.125 mg by mouth daily.    Historical Provider, MD  furosemide (LASIX) 20 MG tablet Take 20 mg by mouth daily.    Historical Provider, MD  levothyroxine (SYNTHROID, LEVOTHROID) 100 MCG tablet TAKE 1 TABLET DAILY 09/22/13   Dorena Cookey, MD  methotrexate (RHEUMATREX) 2.5 MG tablet Take 5-7.5 mg by mouth 2 (two) times a week. Take 3 tablets on Saturday and 2 tablets on sunday    Historical Provider, MD  metoprolol succinate (TOPROL-XL) 50 MG 24 hr tablet Take 50 mg by mouth 2 (two) times daily. Take with or immediately following a meal.    Historical Provider, MD  metoprolol succinate (TOPROL-XL) 50 MG 24 hr tablet TAKE 1 TABLET TWICE A DAY 11/27/13   Dorothy Spark, MD  Multiple Vitamin (MULTIVITAMIN WITH MINERALS) TABS tablet Take 1 tablet by mouth daily.    Historical Provider, MD  nitroGLYCERIN (NITROSTAT) 0.4 MG SL tablet Place 0.4 mg under the tongue every 5 (five) minutes as needed. For chest pain 02/24/11   Renella Cunas, MD  potassium chloride SA (K-DUR,KLOR-CON) 20 MEQ tablet Take 1 tablet (20 mEq total) by mouth daily. 07/05/13   Eulas Post, MD  traMADol (ULTRAM) 50 MG tablet Take 50 mg by mouth every 8 (eight) hours as needed. For pain    Historical Provider, MD  warfarin (COUMADIN) 2.5 MG tablet Take as directed by anticoagulation clinic 11/10/13   Dorena Cookey, MD    Family History  Family History  Problem Relation Age of Onset  . Heart disease Mother   . Malignant hyperthermia Mother   . Hypertension Mother   . Heart disease Father   . Malignant hyperthermia Father   . Hyperlipidemia Father   . Hypertension Father   . Heart disease Sister   . Hypertension Sister   . Heart disease Brother     Social History  History   Social History  . Marital Status: Married    Spouse Name: N/A  . Number of Children: N/A  .  Years of Education: N/A   Occupational History  . Not on file.   Social History Main Topics  . Smoking status: Former Smoker -- 1.00 packs/day for 40 years    Types: Cigarettes    Quit date: 09/12/1989  . Smokeless tobacco: Former Systems developer    Quit date: 11/26/1990     Comment: quit around 1992  . Alcohol Use: No     Comment: former, quit drinking in 1992  . Drug Use: No  . Sexual Activity: Not Currently   Other Topics Concern  . 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  Cardiovascular:  No chest pain, dyspnea on exertion, edema, orthopnea, palpitations, paroxysmal nocturnal dyspnea. Dermatological: No rash, lesions/masses Respiratory: No cough, dyspnea Urologic: No hematuria, dysuria Abdominal:   No nausea, vomiting, diarrhea, bright red blood per rectum, melena, or hematemesis Neurologic:  No visual changes, wkns, changes in mental status. All other systems reviewed and are otherwise negative except as noted above.  Physical Exam  Blood pressure 122/50, pulse 58, height 6' (1.829 m), weight 157 lb (71.215 kg).  General: Pleasant, NAD Psych: Normal affect. Neuro: Alert and oriented X 3. Moves all extremities spontaneously. HEENT: Normal  Neck: Supple without bruits or JVD. Lungs:  Resp regular and unlabored, CTA. Heart: RRR no s3, s4, or murmurs. Abdomen: Soft, non-tender, non-distended, BS + x 4.  Extremities: No clubbing, cyanosis, + 2 LE edema up to the knees DP/PT/Radials 2+ and equal bilaterally.  Labs:  No results for input(s): CKTOTAL, CKMB, TROPONINI in the last 72 hours. Lab Results  Component Value Date   WBC 9.0 02/20/2015   HGB 10.8* 02/20/2015   HCT 32.5* 02/20/2015   MCV 90.8 02/20/2015   PLT  249 02/20/2015   No results for input(s): NA, K, CL, CO2, BUN, CREATININE, CALCIUM, PROT, BILITOT, ALKPHOS, ALT, AST, GLUCOSE in the last 168 hours.  Invalid input(s): LABALBU Lab Results  Component Value Date   CHOL 101 12/28/2013   HDL 32.70* 12/28/2013   LDLCALC 54 12/28/2013   TRIG 71.0 12/28/2013   No results found for: DDIMER Invalid input(s): POCBNP  Accessory Clinical Findings  Echocardiogram 11/27/2011  - Left ventricle: The cavity size was normal. Systolic function was mildly reduced. The estimated ejection fraction was in the range of 45% to 50%. Wall motion was normal; there were no regional wall motion abnormalities. - Mitral valve: Mild regurgitation. - Left atrium: The atrium was mildly to moderately dilated. - Right ventricle: The cavity size was mildly dilated. Wall thickness was normal. Systolic function was mildly reduced. - Right atrium: The atrium was moderately dilated. - Pulmonary arteries: Systolic pressure was mildly to moderately increased.  ECG - atrial fibrillation, rate controlled 64 BPM, nonspecific ST abnormality  Lexiscan nuclear stress test 05/31/2014 Impression  Exercise Capacity: Lexiscan with no exercise.  BP Response: Normal blood pressure response.  Clinical Symptoms: No significant symptoms noted.  ECG Impression: There are scattered PVCs.  Comparison with Prior Nuclear Study: 2013 - mild apical thinning, non-gated  Overall Impression: Low risk stress nuclear study with small fixed inferoapical defect, which could represent thinning. No ischemia.    Assessment & Plan  1. CAD - stable, continue ASA, lipitor, metoprolol XL, negative stress test on 05/31/14, no more chest pain, stays active. EKG shows unchanged nonspecific changes in the lateral leads patient is asymptomatic and not further ischemic workup necessary at this point.  2. Chronic systolic CHF - LVEF 00-93%, on lasix 40 mg po daily, the patient has slightly improved chronic  pitting LE edema, the patient used diet stockings at night as he can't put the compression stocking on. He is encouraged to continue doing that. We'll stop potassium supplements and just encouraged to drink orange juice and eat food rich in potassium.  3. Carotid disease - His last sono carotid on 09/12/14 was unchanged from prior, with chronic right ICA occlusion and  < 40% stenosis in left ICA.  4. Chronic persistent A-fib - rate controlled, INR therapeutic, no falls, we will continue Coumadin for now.  5. Lipids - we will check  Follow-up in 6 months.   Dorothy Spark, MD, [facility] 04/10/2015, 4:02 PM

## 2015-04-10 NOTE — Telephone Encounter (Signed)
Spoke with Beth at [facility] and informed her that we received the pts current med list she faxed to our office with the vitamins noted on the list.  Per Eustaquio Maize she stated the pts current dose of Coumadin in 7.5 mg on Monday, Tuesday, Wednesday, and Friday, and Coumadin 5 mg on Thursday, Saturday, and Sunday.  Pt will have PT/INR rechecked on 04/16/15.  Informed Beth that I will update Dr Meda Coffee about this.

## 2015-04-10 NOTE — Telephone Encounter (Signed)
New message      Home health nurse faxed to [place] a list of medications and supplemental vitamins pt is taking along with his warfarin.  He has an appt this pm.  She want the doctor to be aware of this and let pt know if he should not take these vitamins.  If you did not get the list, call her.

## 2015-04-10 NOTE — Patient Instructions (Signed)
Your physician recommends that you continue on your current medications as directed. Please refer to the Current Medication list given to you today.     Your physician wants you to follow-up in: 6 MONTHS WITH DR NELSON You will receive a reminder letter in the mail two months in advance. If you don't receive a letter, please call our office to schedule the follow-up appointment.  

## 2015-04-12 DIAGNOSIS — I5022 Chronic systolic (congestive) heart failure: Secondary | ICD-10-CM | POA: Diagnosis not present

## 2015-04-12 DIAGNOSIS — R1310 Dysphagia, unspecified: Secondary | ICD-10-CM | POA: Diagnosis not present

## 2015-04-12 DIAGNOSIS — Z4781 Encounter for orthopedic aftercare following surgical amputation: Secondary | ICD-10-CM | POA: Diagnosis not present

## 2015-04-12 DIAGNOSIS — I4891 Unspecified atrial fibrillation: Secondary | ICD-10-CM | POA: Diagnosis not present

## 2015-04-12 DIAGNOSIS — I2510 Atherosclerotic heart disease of native coronary artery without angina pectoris: Secondary | ICD-10-CM | POA: Diagnosis not present

## 2015-04-12 DIAGNOSIS — I10 Essential (primary) hypertension: Secondary | ICD-10-CM | POA: Diagnosis not present

## 2015-04-16 ENCOUNTER — Telehealth: Payer: Self-pay | Admitting: Family Medicine

## 2015-04-16 ENCOUNTER — Ambulatory Visit (INDEPENDENT_AMBULATORY_CARE_PROVIDER_SITE_OTHER): Payer: Medicare Other | Admitting: General Practice

## 2015-04-16 DIAGNOSIS — Z5181 Encounter for therapeutic drug level monitoring: Secondary | ICD-10-CM

## 2015-04-16 DIAGNOSIS — I4891 Unspecified atrial fibrillation: Secondary | ICD-10-CM

## 2015-04-16 LAB — POCT INR: INR: 2.7

## 2015-04-16 NOTE — Telephone Encounter (Signed)
[name] [name] from [facility] needs an order to put into the system for patient to have his Coumadin check here at 12:15.  She a callback before he is seen.

## 2015-04-16 NOTE — Progress Notes (Signed)
Pre visit review using our clinic review tool, if applicable. No additional management support is needed unless otherwise documented below in the visit note. 

## 2015-04-17 ENCOUNTER — Other Ambulatory Visit: Payer: Self-pay | Admitting: General Practice

## 2015-04-17 DIAGNOSIS — Z4781 Encounter for orthopedic aftercare following surgical amputation: Secondary | ICD-10-CM | POA: Diagnosis not present

## 2015-04-17 DIAGNOSIS — R1310 Dysphagia, unspecified: Secondary | ICD-10-CM | POA: Diagnosis not present

## 2015-04-17 DIAGNOSIS — I4891 Unspecified atrial fibrillation: Secondary | ICD-10-CM | POA: Diagnosis not present

## 2015-04-17 DIAGNOSIS — I2510 Atherosclerotic heart disease of native coronary artery without angina pectoris: Secondary | ICD-10-CM | POA: Diagnosis not present

## 2015-04-17 DIAGNOSIS — I5022 Chronic systolic (congestive) heart failure: Secondary | ICD-10-CM | POA: Diagnosis not present

## 2015-04-17 DIAGNOSIS — I10 Essential (primary) hypertension: Secondary | ICD-10-CM | POA: Diagnosis not present

## 2015-04-17 MED ORDER — WARFARIN SODIUM 5 MG PO TABS: ORAL_TABLET | ORAL | Status: DC

## 2015-04-24 ENCOUNTER — Other Ambulatory Visit: Payer: Self-pay | Admitting: Family Medicine

## 2015-04-24 ENCOUNTER — Other Ambulatory Visit: Payer: Self-pay | Admitting: Cardiology

## 2015-04-25 ENCOUNTER — Other Ambulatory Visit: Payer: Self-pay | Admitting: *Deleted

## 2015-04-25 DIAGNOSIS — Z4781 Encounter for orthopedic aftercare following surgical amputation: Secondary | ICD-10-CM | POA: Diagnosis not present

## 2015-04-25 DIAGNOSIS — I4891 Unspecified atrial fibrillation: Secondary | ICD-10-CM | POA: Diagnosis not present

## 2015-04-25 DIAGNOSIS — R1310 Dysphagia, unspecified: Secondary | ICD-10-CM | POA: Diagnosis not present

## 2015-04-25 DIAGNOSIS — I5022 Chronic systolic (congestive) heart failure: Secondary | ICD-10-CM | POA: Diagnosis not present

## 2015-04-25 DIAGNOSIS — I2510 Atherosclerotic heart disease of native coronary artery without angina pectoris: Secondary | ICD-10-CM | POA: Diagnosis not present

## 2015-04-25 DIAGNOSIS — I10 Essential (primary) hypertension: Secondary | ICD-10-CM | POA: Diagnosis not present

## 2015-04-25 MED ORDER — DIGOXIN 125 MCG PO TABS: 0.1250 mg | ORAL_TABLET | Freq: Every day | ORAL | Status: DC

## 2015-04-30 ENCOUNTER — Ambulatory Visit (INDEPENDENT_AMBULATORY_CARE_PROVIDER_SITE_OTHER): Payer: Medicare Other | Admitting: *Deleted

## 2015-04-30 DIAGNOSIS — Z5181 Encounter for therapeutic drug level monitoring: Secondary | ICD-10-CM

## 2015-04-30 DIAGNOSIS — I4891 Unspecified atrial fibrillation: Secondary | ICD-10-CM

## 2015-04-30 LAB — POCT INR: INR: 1.8

## 2015-04-30 NOTE — Progress Notes (Signed)
Pre visit review using our clinic review tool, if applicable. No additional management support is needed unless otherwise documented below in the visit note. 

## 2015-05-11 DIAGNOSIS — I2510 Atherosclerotic heart disease of native coronary artery without angina pectoris: Secondary | ICD-10-CM | POA: Diagnosis not present

## 2015-05-11 DIAGNOSIS — I4891 Unspecified atrial fibrillation: Secondary | ICD-10-CM | POA: Diagnosis not present

## 2015-05-11 DIAGNOSIS — Z4781 Encounter for orthopedic aftercare following surgical amputation: Secondary | ICD-10-CM | POA: Diagnosis not present

## 2015-05-11 DIAGNOSIS — I5022 Chronic systolic (congestive) heart failure: Secondary | ICD-10-CM | POA: Diagnosis not present

## 2015-05-11 DIAGNOSIS — I10 Essential (primary) hypertension: Secondary | ICD-10-CM | POA: Diagnosis not present

## 2015-05-11 DIAGNOSIS — R1310 Dysphagia, unspecified: Secondary | ICD-10-CM | POA: Diagnosis not present

## 2015-05-17 ENCOUNTER — Encounter: Payer: Medicare Other | Admitting: Adult Health

## 2015-05-18 ENCOUNTER — Encounter: Payer: Self-pay | Admitting: Adult Health

## 2015-05-18 ENCOUNTER — Ambulatory Visit (INDEPENDENT_AMBULATORY_CARE_PROVIDER_SITE_OTHER): Payer: Medicare Other | Admitting: Adult Health

## 2015-05-18 VITALS — BP 102/60 | Temp 98.3°F | Ht 72.0 in | Wt 157.0 lb

## 2015-05-18 DIAGNOSIS — Z23 Encounter for immunization: Secondary | ICD-10-CM | POA: Diagnosis not present

## 2015-05-18 DIAGNOSIS — N400 Enlarged prostate without lower urinary tract symptoms: Secondary | ICD-10-CM

## 2015-05-18 DIAGNOSIS — I2510 Atherosclerotic heart disease of native coronary artery without angina pectoris: Secondary | ICD-10-CM | POA: Diagnosis not present

## 2015-05-18 DIAGNOSIS — E785 Hyperlipidemia, unspecified: Secondary | ICD-10-CM

## 2015-05-18 DIAGNOSIS — E039 Hypothyroidism, unspecified: Secondary | ICD-10-CM

## 2015-05-18 DIAGNOSIS — Z0000 Encounter for general adult medical examination without abnormal findings: Secondary | ICD-10-CM

## 2015-05-18 LAB — CBC WITH DIFFERENTIAL/PLATELET
Basophils Absolute: 0 10*3/uL (ref 0.0–0.1)
Basophils Relative: 0.3 % (ref 0.0–3.0)
EOS PCT: 3.1 % (ref 0.0–5.0)
Eosinophils Absolute: 0.2 10*3/uL (ref 0.0–0.7)
HEMATOCRIT: 31.6 % — AB (ref 39.0–52.0)
HEMOGLOBIN: 10.5 g/dL — AB (ref 13.0–17.0)
Lymphocytes Relative: 15.8 % (ref 12.0–46.0)
Lymphs Abs: 1.1 10*3/uL (ref 0.7–4.0)
MCHC: 33.1 g/dL (ref 30.0–36.0)
MCV: 90.4 fl (ref 78.0–100.0)
MONO ABS: 0.8 10*3/uL (ref 0.1–1.0)
Monocytes Relative: 11.9 % (ref 3.0–12.0)
Neutro Abs: 4.7 10*3/uL (ref 1.4–7.7)
Neutrophils Relative %: 68.9 % (ref 43.0–77.0)
Platelets: 220 10*3/uL (ref 150.0–400.0)
RBC: 3.5 Mil/uL — ABNORMAL LOW (ref 4.22–5.81)
RDW: 17.8 % — ABNORMAL HIGH (ref 11.5–15.5)
WBC: 6.8 10*3/uL (ref 4.0–10.5)

## 2015-05-18 LAB — BASIC METABOLIC PANEL
BUN: 10 mg/dL (ref 6–23)
CALCIUM: 9.1 mg/dL (ref 8.4–10.5)
CO2: 35 mEq/L — ABNORMAL HIGH (ref 19–32)
Chloride: 98 mEq/L (ref 96–112)
Creatinine, Ser: 1.21 mg/dL (ref 0.40–1.50)
GFR: 60.17 mL/min (ref >60.00)
Glucose, Bld: 83 mg/dL (ref 70–99)
Potassium: 4 mEq/L (ref 3.5–5.1)
SODIUM: 137 meq/L (ref 135–145)

## 2015-05-18 LAB — HEPATIC FUNCTION PANEL
ALT: 28 U/L (ref 0–53)
AST: 46 U/L — ABNORMAL HIGH (ref 0–37)
Albumin: 3.7 g/dL (ref 3.5–5.2)
Alkaline Phosphatase: 74 U/L (ref 39–117)
Bilirubin, Direct: 0.1 mg/dL (ref 0.0–0.3)
Total Bilirubin: 0.5 mg/dL (ref 0.2–1.2)
Total Protein: 6.6 g/dL (ref 6.0–8.3)

## 2015-05-18 LAB — POCT URINALYSIS DIPSTICK
BILIRUBIN UA: NEGATIVE
GLUCOSE UA: NEGATIVE
KETONES UA: NEGATIVE
LEUKOCYTES UA: NEGATIVE
NITRITE UA: NEGATIVE
Protein, UA: NEGATIVE
RBC UA: NEGATIVE
Spec Grav, UA: 1.01
Urobilinogen, UA: 0.2
pH, UA: 6.5

## 2015-05-18 LAB — TSH: TSH: 3.85 u[IU]/mL (ref 0.35–4.50)

## 2015-05-18 NOTE — Progress Notes (Addendum)
Subjective:  Patient presents today for their annual wellness visit.  He is a pleasant 81 year old caucasian male. With significant cardiac history ( MI, A fib, Systolic CHF, carotid artery disease, aortic stenosis) as well as COPD   His most recent hospitalization was on 02/17/2015 - 02/23/2015 for cellulitis of right leg s/p amputation of the right third toe on 3/30 due to osteomyelitis.   He has no complaints today and states " I am feeling good!". His wife is now in a Alzheimer unit and he visits her frequently.  Preventive Screening-Counseling & Management  Smoking Status: Former Smoker, quit in 1990 and smoked for 40 years.  Second Hand Smoking status:Non  Risk Factors Regular exercise: Yes, walking and stationary bike about one day a week Diet: He tries to follow a healthy diet.   Fall Risk:None   Cardiac risk factors:  advanced age (older than 77 for men, 64 for women) Yes Hyperlipidemia Yes No diabetes.  Family History: yes   Depression Screen None. PHQ2 0   Activities of Daily Living Independent ADLs and IADLs   Hearing Difficulties: He endorses hearing loss but does not want hearing aids because " they are so expensive." He has had a recent hearing exam.   Cognitive Testing No reported trouble.  Normal 3 word recall  List the Names of Other Physician/Practitioners you currently use: 1.Dr [name] [name] and Dr. Essie [name] 2. [place]  Immunization History  Administered Date(s) Administered  . Influenza Split 08/25/2011, 08/09/2012  . Influenza Whole 08/29/2008, 09/07/2009, 08/15/2010  . Influenza,inj,Quad PF,36+ Mos 08/09/2013, 02/23/2015  . Influenza-Unspecified 07/25/2014  . Pneumococcal Polysaccharide-23 08/25/2011  . Tdap 10/30/2014  . Zoster 05/15/2014   Required Immunizations needed today: Prevnar 13  Screening tests- up to date Health Maintenance Due  Topic Date Due  . COLONOSCOPY  07/11/1977  . PNA vac Low Risk Adult (2 of 2 - PCV13)  08/24/2012    ROS- No pertinent positives discovered in course of AWV  The following were reviewed and entered/updated in epic: Past Medical History  Diagnosis Date  . Carotid artery occlusion     right total internal artery occlusion  . Coronary artery disease     s/p cardiac cath in 1990s, and cardiolite study  in 2005 showing EF 54%  . Hyperlipidemia   . Hypertension   . TIA (transient ischemic attack)   . Degenerative disc disease     cervical spine  . Arrhythmia   . Atrial fibrillation 03/2011  . Myocardial infarction 1992 and 1994  . Angina     "jaw & elbow was the worse"  . Pneumonia ~ 1935  . GERD (gastroesophageal reflux disease)   . Skin cancer     forehead  . Urgency incontinence   . Bilateral cellulitis of lower leg    Patient Active Problem List   Diagnosis Date Noted  . Aortic stenosis 04/10/2015  . Protein-calorie malnutrition, severe 02/23/2015  . Osteomyelitis 02/17/2015  . Chronic anticoagulation 02/17/2015  . Bilateral carotid artery disease 09/12/2014  . Chronic systolic CHF (congestive heart failure), NYHA class 2 07/10/2014  . Encounter for therapeutic drug monitoring 01/02/2014  . Unspecified hypothyroidism 07/04/2013  . Asthma exacerbation, allergic 09/13/2012  . Occlusion and stenosis of carotid artery without mention of cerebral infarction 05/18/2012  . Adrenal insufficiency 02/08/2012  . Thrombocytopenia 02/07/2012  . COPD with asthma 01/26/2012  . Anemia 11/26/2011  . Long term (current) use of anticoagulants 02/24/2011  . Atrial fibrillation 01/12/2009  .  ALLERGIC RHINITIS 02/24/2008  . HLD (hyperlipidemia) 06/15/2007  . Benign essential HTN 06/15/2007  . MYOCARDIAL INFARCTION, HX OF 06/15/2007  . Coronary atherosclerosis 06/15/2007  . TRANSIENT ISCHEMIC ATTACK, HX OF 06/15/2007   Past Surgical History  Procedure Laterality Date  . Cataract extraction w/ intraocular lens  implant, bilateral    . Cardiac catheterization  1990s  .  Skin cancer excision  12/2011    forehead  . Amputation Right 02/21/2015    Procedure: Right 3rd Ray Amputation;  Surgeon: Newt Minion, MD;  Location: [facility];  Service: Orthopedics;  Laterality: Right;  Digital block and ankle block with MAC    Family History  Problem Relation Age of Onset  . Heart disease Mother   . Malignant hyperthermia Mother   . Hypertension Mother   . Heart disease Father   . Malignant hyperthermia Father   . Hyperlipidemia Father   . Hypertension Father   . Heart disease Sister   . Hypertension Sister   . Heart disease Brother     Medications- reviewed and updated Current Outpatient Prescriptions  Medication Sig Dispense Refill  . aspirin 81 MG tablet Take 81 mg by mouth at bedtime.     Marland Kitchen atorvastatin (LIPITOR) 10 MG tablet TAKE 1 TABLET AT BEDTIME 90 tablet 1  . Calcium-Vitamin D (CALTRATE 600 PLUS-VIT D PO) Take 1 tablet by mouth at bedtime.     . digoxin (LANOXIN) 0.125 MG tablet Take 1 tablet (0.125 mg total) by mouth daily. 90 tablet 1  . Fish Oil-Krill Oil (KRILL OIL PLUS PO) Take 1 tablet by mouth daily. Krill Oil-Omega 3- DHA-EPA 300-90 (27-45) mg    . furosemide (LASIX) 40 MG tablet Take 1 tablet (40 mg total) by mouth daily. 90 tablet 3  . LANOXIN 125 MCG tablet TAKE 1 TABLET DAILY 90 tablet 1  . levothyroxine (SYNTHROID, LEVOTHROID) 100 MCG tablet TAKE 1 TABLET DAILY 90 tablet 0  . loperamide (IMODIUM) 2 MG capsule Take 1 capsule (2 mg total) by mouth as needed for diarrhea or loose stools (max 8 mg daily). 30 capsule 0  . methotrexate (RHEUMATREX) 2.5 MG tablet Take 7.5 mg by mouth 2 (two) times a week. Saturday and Sunday    . Misc Natural Products (PROSTATE HEALTH PO) Take 2 tablets by mouth daily. 160-100-100    . Multiple Vitamin (MULTIVITAMIN WITH MINERALS) TABS tablet Take 1 tablet by mouth daily.    . nitroGLYCERIN (NITROSTAT) 0.4 MG SL tablet Place 1 tablet (0.4 mg total) under the tongue every 5 (five) minutes as needed for chest pain.  30 tablet 0  . spironolactone (ALDACTONE) 25 MG tablet Take 25 mg by mouth daily.    . traMADol (ULTRAM) 50 MG tablet Take 1 tablet (50 mg total) by mouth every 6 (six) hours as needed for moderate pain. Take 1-2 tablets by mouth daily as needed for arthritis 30 tablet 0  . warfarin (COUMADIN) 5 MG tablet Take as directed by anticoagulation clinic. 120 tablet 1  . Multiple Vitamins-Minerals (MEGA MULTI MEN PO) Take 1 tablet by mouth daily. 200-175-250 mcg    . Multiple Vitamins-Minerals (OCUVITE PO) Take 1 tablet by mouth daily.    . Pediatric Multivitamins-Fl (MULTIPLE VITAMINS/FLUORIDE) 1 MG CHEW Chew 1 tablet by mouth daily.    . [DISCONTINUED] diphenhydrAMINE (BENADRYL) 25 MG tablet Take 25 mg by mouth every 6 (six) hours as needed. For itching    . [DISCONTINUED] methotrexate (RHEUMATREX) 2.5 MG tablet Take 10 mg by mouth  once a week. Sunday     No current facility-administered medications for this visit.    Allergies-reviewed and updated Allergies  Allergen Reactions  . Levofloxacin Rash    "thought they were sticking swords thru me"  . Klor-Con [Potassium Chloride Er] Diarrhea    DIARRHEA    History   Social History  . Marital Status: Married    Spouse Name: N/A  . Number of Children: N/A  . Years of Education: N/A   Social History Main Topics  . Smoking status: Former Smoker -- 1.00 packs/day for 40 years    Types: Cigarettes    Quit date: 09/12/1989  . Smokeless tobacco: Former Systems developer    Quit date: 11/26/1990     Comment: quit around 1992  . Alcohol Use: No     Comment: former, quit drinking in 1992  . Drug Use: No  . Sexual Activity: Not Currently   Other Topics Concern  . 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 Oropharynx normal. Has upper and lower dentures Neck: no thyromegaly CV: A-fib, no murmurs rubs or gallops Lungs: CTAB no crackles, wheeze, rhonchi Abdomen: soft/nontender/nondistended/normal bowel sounds. No rebound or guarding.  Ext: Non pitting edema in bilateral lower extremities. Skin: warm, dry Neuro: grossly normal, moves all extremities, PERRLA  Assessment/Plan:  1. Encounter for Medicare annual wellness exam  I reviewed all health maintenance protocols including  colonoscopy,and bone density. Needed referrals were placed. Age and diagnosis  appropriate screening labs were ordered. His immunization history was reviewed and appropriate vaccinations were ordered. His current medications and allergies were reviewed and needed refills of his chronic medications were ordered. The plan for yearly health maintenance was discussed all orders and referrals were made as appropriate. - Continue to eat a healthy diet and exercise.  - Follow up in one year for MWE - Follow up as needed - Will follow up with patient on blood work  2. HLD (hyperlipidemia)  - Basic metabolic panel - CBC with Differential/Platelet - Hepatic function panel - TSH - POCT urinalysis dipstick - Lipid panel; Future  3. Hypothyroidism, unspecified hypothyroidism type  - TSH  4. Benign enlargement of prostate  - PSA, total and free The natural history of prostate cancer and ongoing controversy regarding screening and potential treatment outcomes of prostate cancer has been discussed with the patient. The meaning of a false positive PSA and a false negative PSA has been discussed. He indicates understanding of the limitations of this screening test and wishes  to proceed with screening PSA testing.   5. Need for pneumococcal vaccine  - Pneumococcal conjugate vaccine 13-valent IM     [name] [name] , Thank you for taking time to  come for your Medicare Wellness Visit. I appreciate your ongoing commitment to your health goals. Please review the following plan we discussed and let me know if I can assist you in the future.   These are the goals we discussed: Goals    . LDL CALC < 70       This is a list of the screening recommended for you  and due dates:  Health Maintenance  Topic Date Due  . Colon Cancer Screening  07/11/1977  . Pneumonia vaccines (2 of 2 - PCV13) 08/24/2012  . Flu Shot  06/25/2015  . Tetanus Vaccine  10/30/2024  . Shingles Vaccine  Completed       Return precautions advised.   No orders of the defined types were placed in this encounter.    No orders of the defined types were placed in this encounter.

## 2015-05-18 NOTE — Patient Instructions (Addendum)
It was a pleasure meeting you today! Please continue to exercise and eat healthy.   You are not up to date on your vaccinations. If you need anything, please let me know.     [name] , Thank you for taking time to come for your Medicare Wellness Visit. I appreciate your ongoing commitment to your health goals. Please review the following plan we discussed and let me know if I can assist you in the future.   These are the goals we discussed: Goals    . LDL CALC < 70       This is a list of the screening recommended for you and due dates:  Health Maintenance  Topic Date Due  . Colon Cancer Screening  07/11/1977  . Pneumonia vaccines (2 of 2 - PCV13) 08/24/2012  . Flu Shot  06/25/2015  . Tetanus Vaccine  10/30/2024  . Shingles Vaccine  Completed    Health Maintenance A healthy lifestyle and preventative care can promote health and wellness.  Maintain regular health, dental, and eye exams.  Eat a healthy diet. Foods like vegetables, fruits, whole grains, low-fat dairy products, and lean protein foods contain the nutrients you need and are low in calories. Decrease your intake of foods high in solid fats, added sugars, and salt. Get information about a proper diet from your health care provider, if necessary.  Regular physical exercise is one of the most important things you can do for your health. Most adults should get at least 150 minutes of moderate-intensity exercise (any activity that increases your heart rate and causes you to sweat) each week. In addition, most adults need muscle-strengthening exercises on 2 or more days a week.   Maintain a healthy weight. The body mass index (BMI) is a screening tool to identify possible weight problems. It provides an estimate of body fat based on height and weight. Your health care provider can find your BMI and can help you achieve or maintain a healthy weight. For males 20 years and older:  A BMI below 18.5 is considered  underweight.  A BMI of 18.5 to 24.9 is normal.  A BMI of 25 to 29.9 is considered overweight.  A BMI of 30 and above is considered obese.  Maintain normal blood lipids and cholesterol by exercising and minimizing your intake of saturated fat. Eat a balanced diet with plenty of fruits and vegetables. Blood tests for lipids and cholesterol should begin at age 84 and be repeated every 5 years. If your lipid or cholesterol levels are high, you are over age 27, or you are at high risk for heart disease, you may need your cholesterol levels checked more frequently.Ongoing high lipid and cholesterol levels should be treated with medicines if diet and exercise are not working.  If you smoke, find out from your health care provider how to quit. If you do not use tobacco, do not start.  Lung cancer screening is recommended for adults aged 93-80 years who are at high risk for developing lung cancer because of a history of smoking. A yearly low-dose CT scan of the lungs is recommended for people who have at least a 30-pack-year history of smoking and are current smokers or have quit within the past 15 years. A pack year of smoking is smoking an average of 1 pack of cigarettes a day for 1 year (for example, a 30-pack-year history of smoking could mean smoking 1 pack a day for 30 years or 2 packs a day  for 15 years). Yearly screening should continue until the smoker has stopped smoking for at least 15 years. Yearly screening should be stopped for people who develop a health problem that would prevent them from having lung cancer treatment.  If you choose to drink alcohol, do not have more than 2 drinks per day. One drink is considered to be 12 oz (360 mL) of beer, 5 oz (150 mL) of wine, or 1.5 oz (45 mL) of liquor.  Avoid the use of street drugs. Do not share needles with anyone. Ask for help if you need support or instructions about stopping the use of drugs.  High blood pressure causes heart disease and  increases the risk of stroke. Blood pressure should be checked at least every 1-2 years. Ongoing high blood pressure should be treated with medicines if weight loss and exercise are not effective.  If you are 46-31 years old, ask your health care provider if you should take aspirin to prevent heart disease.  Diabetes screening involves taking a blood sample to check your fasting blood sugar level. This should be done once every 3 years after age 33 if you are at a normal weight and without risk factors for diabetes. Testing should be considered at a younger age or be carried out more frequently if you are overweight and have at least 1 risk factor for diabetes.  Colorectal cancer can be detected and often prevented. Most routine colorectal cancer screening begins at the age of 24 and continues through age 5. However, your health care provider may recommend screening at an earlier age if you have risk factors for colon cancer. On a yearly basis, your health care provider may provide home test kits to check for hidden blood in the stool. A small camera at the end of a tube may be used to directly examine the colon (sigmoidoscopy or colonoscopy) to detect the earliest forms of colorectal cancer. Talk to your health care provider about this at age 78 when routine screening begins. A direct exam of the colon should be repeated every 5-10 years through age 39, unless early forms of precancerous polyps or small growths are found.  People who are at an increased risk for hepatitis B should be screened for this virus. You are considered at high risk for hepatitis B if:  You were born in a country where hepatitis B occurs often. Talk with your health care provider about which countries are considered high risk.  Your parents were born in a high-risk country and you have not received a shot to protect against hepatitis B (hepatitis B vaccine).  You have HIV or AIDS.  You use needles to inject street  drugs.  You live with, or have sex with, someone who has hepatitis B.  You are a man who has sex with other men (MSM).  You get hemodialysis treatment.  You take certain medicines for conditions like cancer, organ transplantation, and autoimmune conditions.  Hepatitis C blood testing is recommended for all people born from [birth year] through 1963 and any individual with known risk factors for hepatitis C.  Healthy men should no longer receive prostate-specific antigen (PSA) blood tests as part of routine cancer screening. Talk to your health care provider about prostate cancer screening.  Testicular cancer screening is not recommended for adolescents or adult males who have no symptoms. Screening includes self-exam, a health care provider exam, and other screening tests. Consult with your health care provider about any symptoms you have or any  concerns you have about testicular cancer.  Practice safe sex. Use condoms and avoid high-risk sexual practices to reduce the spread of sexually transmitted infections (STIs).  You should be screened for STIs, including gonorrhea and chlamydia if:  You are sexually active and are younger than 24 years.  You are older than 24 years, and your health care provider tells you that you are at risk for this type of infection.  Your sexual activity has changed since you were last screened, and you are at an increased risk for chlamydia or gonorrhea. Ask your health care provider if you are at risk.  If you are at risk of being infected with HIV, it is recommended that you take a prescription medicine daily to prevent HIV infection. This is called pre-exposure prophylaxis (PrEP). You are considered at risk if:  You are a man who has sex with other men (MSM).  You are a heterosexual man who is sexually active with multiple partners.  You take drugs by injection.  You are sexually active with a partner who has HIV.  Talk with your health care provider about  whether you are at high risk of being infected with HIV. If you choose to begin PrEP, you should first be tested for HIV. You should then be tested every 3 months for as long as you are taking PrEP.  Use sunscreen. Apply sunscreen liberally and repeatedly throughout the day. You should seek shade when your shadow is shorter than you. Protect yourself by wearing long sleeves, pants, a wide-brimmed hat, and sunglasses year round whenever you are outdoors.  Tell your health care provider of new moles or changes in moles, especially if there is a change in shape or color. Also, tell your health care provider if a mole is larger than the size of a pencil eraser.  A one-time screening for abdominal aortic aneurysm (AAA) and surgical repair of large AAAs by ultrasound is recommended for men aged 106-75 years who are current or former smokers.  Stay current with your vaccines (immunizations). Document Released: 05/08/2008 Document Revised: 11/15/2013 Document Reviewed: 04/07/2011 [facility] Patient Information 2015 [place], [place]. This information is not intended to replace advice given to you by your health care provider. Make sure you discuss any questions you have with your health care provider.

## 2015-05-18 NOTE — Progress Notes (Signed)
Pre visit review using our clinic review tool, if applicable. No additional management support is needed unless otherwise documented below in the visit note. 

## 2015-05-19 LAB — PSA, TOTAL AND FREE
PSA FREE PCT: 56 % (ref >25)
PSA, Free: 0.27 ng/mL
PSA: 0.48 ng/mL (ref <=4.00)

## 2015-05-20 ENCOUNTER — Other Ambulatory Visit: Payer: Self-pay | Admitting: Cardiology

## 2015-05-21 ENCOUNTER — Ambulatory Visit: Payer: Medicare Other

## 2015-05-23 ENCOUNTER — Other Ambulatory Visit (INDEPENDENT_AMBULATORY_CARE_PROVIDER_SITE_OTHER): Payer: Medicare Other

## 2015-05-23 DIAGNOSIS — E785 Hyperlipidemia, unspecified: Secondary | ICD-10-CM | POA: Diagnosis not present

## 2015-05-23 LAB — LIPID PANEL
CHOL/HDL RATIO: 3
Cholesterol: 106 mg/dL (ref 0–200)
HDL: 34.4 mg/dL — ABNORMAL LOW (ref >39.00)
LDL CALC: 54 mg/dL (ref 0–99)
NonHDL: 71.6
Triglycerides: 89 mg/dL (ref 0.0–149.0)
VLDL: 17.8 mg/dL (ref 0.0–40.0)

## 2015-05-27 ENCOUNTER — Other Ambulatory Visit: Payer: Self-pay | Admitting: Cardiology

## 2015-05-29 ENCOUNTER — Ambulatory Visit (INDEPENDENT_AMBULATORY_CARE_PROVIDER_SITE_OTHER): Payer: Medicare Other | Admitting: Podiatry

## 2015-05-29 DIAGNOSIS — B351 Tinea unguium: Secondary | ICD-10-CM | POA: Diagnosis not present

## 2015-05-29 DIAGNOSIS — M79673 Pain in unspecified foot: Secondary | ICD-10-CM | POA: Diagnosis not present

## 2015-05-29 NOTE — Progress Notes (Signed)
Patient ID: [name] [name], male   DOB: 26-Apr-1925, 81 y.o.   MRN: [id number] HPI  Complaint:  Visit Type: Patient returns to my office for continued preventative foot care services. Complaint: Patient states" my nails have grown long and thick and become painful to walk and wear shoes" . He presents for preventative foot care services. No changes to ROS  Podiatric Exam: Vascular: dorsalis pedis and posterior tibial pulses are negative. Capillary return is immediate. Temperature gradient is negative. Skin turgor WNL,   Sensorium: Normal Semmes Weinstein monofilament test. Normal tactile sensation bilaterally.  Nail Exam: Pt has thick disfigured discolored nails with subungual debris noted bilateral entire nail hallux through fifth toenails Ulcer Exam: There is no evidence of ulcer or pre-ulcerative changes or infection. Orthopedic Exam: Muscle tone and strength are WNL. No limitations in general ROM. No crepitus or effusions noted. Foot type and digits show no abnormalities. Bony prominences are unremarkable. Skin: No Porokeratosis. No infection or ulcers  Diagnosis:  Tinea unguium, Pain in right toe, pain in left toes  Treatment & Plan Procedures and Treatment: Consent by patient was obtained for treatment procedures. The patient understood the discussion of treatment and procedures well. All questions were answered thoroughly reviewed. Debridement of mycotic and hypertrophic toenails, 1 through 5 bilateral and clearing of subungual debris. No ulceration, no infection noted.  Return Visit-Office Procedure: Patient instructed to return to the office for a follow up visit 3 months for continued evaluation and treatment.

## 2015-05-31 ENCOUNTER — Ambulatory Visit (INDEPENDENT_AMBULATORY_CARE_PROVIDER_SITE_OTHER): Payer: Medicare Other | Admitting: General Practice

## 2015-05-31 DIAGNOSIS — I4891 Unspecified atrial fibrillation: Secondary | ICD-10-CM | POA: Diagnosis not present

## 2015-05-31 DIAGNOSIS — Z5181 Encounter for therapeutic drug level monitoring: Secondary | ICD-10-CM

## 2015-05-31 LAB — POCT INR: INR: 3.1

## 2015-05-31 NOTE — Progress Notes (Signed)
Pre visit review using our clinic review tool, if applicable. No additional management support is needed unless otherwise documented below in the visit note. 

## 2015-06-28 ENCOUNTER — Ambulatory Visit (INDEPENDENT_AMBULATORY_CARE_PROVIDER_SITE_OTHER): Payer: Medicare Other | Admitting: General Practice

## 2015-06-28 ENCOUNTER — Ambulatory Visit: Payer: Medicare Other

## 2015-06-28 DIAGNOSIS — Z5181 Encounter for therapeutic drug level monitoring: Secondary | ICD-10-CM | POA: Diagnosis not present

## 2015-06-28 DIAGNOSIS — I4891 Unspecified atrial fibrillation: Secondary | ICD-10-CM

## 2015-06-28 LAB — POCT INR: INR: 2.9

## 2015-06-28 NOTE — Progress Notes (Signed)
Pre visit review using our clinic review tool, if applicable. No additional management support is needed unless otherwise documented below in the visit note. 

## 2015-07-09 ENCOUNTER — Ambulatory Visit (INDEPENDENT_AMBULATORY_CARE_PROVIDER_SITE_OTHER): Payer: Medicare Other | Admitting: Cardiology

## 2015-07-09 ENCOUNTER — Encounter: Payer: Self-pay | Admitting: Cardiology

## 2015-07-09 VITALS — BP 106/52 | HR 70 | Ht 72.0 in | Wt 164.0 lb

## 2015-07-09 DIAGNOSIS — I2510 Atherosclerotic heart disease of native coronary artery without angina pectoris: Secondary | ICD-10-CM | POA: Diagnosis not present

## 2015-07-09 DIAGNOSIS — I4891 Unspecified atrial fibrillation: Secondary | ICD-10-CM

## 2015-07-09 DIAGNOSIS — I2583 Coronary atherosclerosis due to lipid rich plaque: Secondary | ICD-10-CM

## 2015-07-09 DIAGNOSIS — E785 Hyperlipidemia, unspecified: Secondary | ICD-10-CM | POA: Diagnosis not present

## 2015-07-09 DIAGNOSIS — I5022 Chronic systolic (congestive) heart failure: Secondary | ICD-10-CM | POA: Diagnosis not present

## 2015-07-09 NOTE — Progress Notes (Signed)
Patient ID: [name] [name], male   DOB: 05/30/1925, 81 y.o.   MRN: [id number] Patient ID: [name] [name], male   DOB: 1924/12/02, 81 y.o.   MRN: [id number]    Patient Name: [name] [name] Date of Encounter: 07/09/2015  Primary Care Provider:  [name], MD Primary Cardiologist:  Dorothy Spark (formerly Dr. Verl Blalock)  Problem List   Past Medical History  Diagnosis Date  . Carotid artery occlusion     right total internal artery occlusion  . Coronary artery disease     s/p cardiac cath in 1990s, and cardiolite study  in 2005 showing EF 54%  . Hyperlipidemia   . Hypertension   . TIA (transient ischemic attack)   . Degenerative disc disease     cervical spine  . Arrhythmia   . Atrial fibrillation 03/2011  . Myocardial infarction 1992 and 1994  . Angina     "jaw & elbow was the worse"  . Pneumonia ~ 1935  . GERD (gastroesophageal reflux disease)   . Skin cancer     forehead  . Urgency incontinence   . Bilateral cellulitis of lower leg    Past Surgical History  Procedure Laterality Date  . Cataract extraction w/ intraocular lens  implant, bilateral    . Cardiac catheterization  1990s  . Skin cancer excision  12/2011    forehead  . Amputation Right 02/21/2015    Procedure: Right 3rd Ray Amputation;  Surgeon: Newt Minion, MD;  Location: [facility];  Service: Orthopedics;  Laterality: Right;  Digital block and ankle block with MAC    Allergies  Allergies  Allergen Reactions  . Levofloxacin Rash    "thought they were sticking swords thru me"  . Klor-Con [Potassium Chloride Er] Diarrhea    DIARRHEA    HPI  Mr. Dusza returns today for evaluation and management of his history of coronary artery disease, history of chronic A. fib, anticoagulation. He also has carotid artery disease which is followed by the vascular surgery group.  He denies any symptoms of TIAs. His last sono carotid on 09/12/14 was unchanged from prior, with chronic right ICA occlusion and  < 40%  stenosis in left ICA. He still very active doing all of his yard work. He was scraping some snow this morning. He denies any chest pain, shortness of breath, palpitations presyncope or syncope. He denies any bleeding or melena. He is overdue for blood work.  07/10/2014 - no complaints, no more chest pain, SOB or palpitations. Stable LE edema.   10/12/2014 - the patient is coming for 3 months follow-up, he continues to have same all over extremity edema. He is unable to prolonged compression stockings. But he is veering on a regular stocking they're giving him significant compression. Patient states that he spends most of his stay on his feet as he is working around the house and he is taking care of his wife with Alzheimer. He is able to lay flat at night and denies any angina, dyspnea on exertion, orthopnea or paroxysmal nocturnal dyspnea.  06/25/2014 - the patient is coming after 3 months, at the last visit we increased his Lasix to 40 mg daily. And we started him on potassium supplements as his potassium was 3.2. The patient states that his lower extremity edema has slightly improved. He is hard time swallowing large potassium pills. He denies any shortness of breath, chest pain palpitations or syncope. He has been married to his wife for 79  years she currently has severe Alzheimer and she is her primary caretaker.  04/10/15 - 3 months follow up, the patient states that he feels well, he continues to have LE edema, however improved, he is not able to use compression stockings as he cont put them on. He denies CP, orthopnea, PND, has stable DOE. He can walk with no difficulties, denies falls, no bleeding.   Home Medications  Prior to Admission medications   Medication Sig Start Date End Date Taking? Authorizing Provider  aspirin 81 MG tablet Take 81 mg by mouth at bedtime.     Historical Provider, MD  atorvastatin (LIPITOR) 10 MG tablet TAKE 1 TABLET AT BEDTIME 10/03/13   Dorothy Spark, MD    Calcium-Vitamin D (CALTRATE 600 PLUS-VIT D PO) Take 1 tablet by mouth at bedtime.     Historical Provider, MD  digoxin (LANOXIN) 0.125 MG tablet Take 0.125 mg by mouth daily.    Historical Provider, MD  furosemide (LASIX) 20 MG tablet Take 20 mg by mouth daily.    Historical Provider, MD  levothyroxine (SYNTHROID, LEVOTHROID) 100 MCG tablet TAKE 1 TABLET DAILY 09/22/13   Dorena Cookey, MD  methotrexate (RHEUMATREX) 2.5 MG tablet Take 5-7.5 mg by mouth 2 (two) times a week. Take 3 tablets on Saturday and 2 tablets on sunday    Historical Provider, MD  metoprolol succinate (TOPROL-XL) 50 MG 24 hr tablet Take 50 mg by mouth 2 (two) times daily. Take with or immediately following a meal.    Historical Provider, MD  metoprolol succinate (TOPROL-XL) 50 MG 24 hr tablet TAKE 1 TABLET TWICE A DAY 11/27/13   Dorothy Spark, MD  Multiple Vitamin (MULTIVITAMIN WITH MINERALS) TABS tablet Take 1 tablet by mouth daily.    Historical Provider, MD  nitroGLYCERIN (NITROSTAT) 0.4 MG SL tablet Place 0.4 mg under the tongue every 5 (five) minutes as needed. For chest pain 02/24/11   Renella Cunas, MD  potassium chloride SA (K-DUR,KLOR-CON) 20 MEQ tablet Take 1 tablet (20 mEq total) by mouth daily. 07/05/13   Eulas Post, MD  traMADol (ULTRAM) 50 MG tablet Take 50 mg by mouth every 8 (eight) hours as needed. For pain    Historical Provider, MD  warfarin (COUMADIN) 2.5 MG tablet Take as directed by anticoagulation clinic 11/10/13   Dorena Cookey, MD    Family History  Family History  Problem Relation Age of Onset  . Heart disease Mother   . Malignant hyperthermia Mother   . Hypertension Mother   . Heart disease Father   . Malignant hyperthermia Father   . Hyperlipidemia Father   . Hypertension Father   . Heart disease Sister   . Hypertension Sister   . Heart disease Brother     Social History  Social History   Social History  . Marital Status: Married    Spouse Name: N/A  . Number of Children:  N/A  . Years of Education: N/A   Occupational History  . Not on file.   Social History Main Topics  . Smoking status: Former Smoker -- 1.00 packs/day for 40 years    Types: Cigarettes    Quit date: 09/12/1989  . Smokeless tobacco: Former Systems developer    Quit date: 11/26/1990     Comment: quit around 1992  . Alcohol Use: No     Comment: former, quit drinking in 1992  . Drug Use: No  . Sexual Activity: Not Currently   Other Topics Concern  .  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  Cardiovascular:  No chest pain, dyspnea on exertion, edema, orthopnea, palpitations, paroxysmal nocturnal dyspnea. Dermatological: No rash, lesions/masses Respiratory: No cough, dyspnea Urologic: No hematuria, dysuria Abdominal:   No nausea, vomiting, diarrhea, bright red blood per rectum, melena, or hematemesis Neurologic:  No visual changes, wkns, changes in mental status. All other systems reviewed and are otherwise negative except as noted above.  Physical Exam  Blood pressure 106/52, pulse 70, height 6' (1.829 m), weight 164 lb (74.39 kg).  General: Pleasant, NAD Psych: Normal affect. Neuro: Alert and oriented X 3. Moves all extremities spontaneously. HEENT: Normal  Neck: Supple without bruits or JVD. Lungs:  Resp regular and unlabored, CTA. Heart: RRR no s3, s4, or murmurs. Abdomen: Soft, non-tender, non-distended, BS + x 4.  Extremities: No clubbing, cyanosis, + 2 LE edema up to the knees DP/PT/Radials 2+ and equal bilaterally.  Labs:  No results for input(s): CKTOTAL, CKMB, TROPONINI in the last 72 hours. Lab Results  Component Value Date   WBC 6.8 05/18/2015   HGB 10.5* 05/18/2015   HCT 31.6* 05/18/2015   MCV 90.4 05/18/2015     PLT 220.0 05/18/2015   No results for input(s): NA, K, CL, CO2, BUN, CREATININE, CALCIUM, PROT, BILITOT, ALKPHOS, ALT, AST, GLUCOSE in the last 168 hours.  Invalid input(s): LABALBU Lab Results  Component Value Date   CHOL 106 05/23/2015   HDL 34.40* 05/23/2015   LDLCALC 54 05/23/2015   TRIG 89.0 05/23/2015   No results found for: DDIMER Invalid input(s): POCBNP  Accessory Clinical Findings  Echocardiogram 11/27/2011  - Left ventricle: The cavity size was normal. Systolic function was mildly reduced. The estimated ejection fraction was in the range of 45% to 50%. Wall motion was normal; there were no regional wall motion abnormalities. - Mitral valve: Mild regurgitation. - Left atrium: The atrium was mildly to moderately dilated. - Right ventricle: The cavity size was mildly dilated. Wall thickness was normal. Systolic function was mildly reduced. - Right atrium: The atrium was moderately dilated. - Pulmonary arteries: Systolic pressure was mildly to moderately increased.  ECG - atrial fibrillation, rate controlled 64 BPM, nonspecific ST abnormality  Lexiscan nuclear stress test 05/31/2014 Impression  Exercise Capacity: Lexiscan with no exercise.  BP Response: Normal blood pressure response.  Clinical Symptoms: No significant symptoms noted.  ECG Impression: There are scattered PVCs.  Comparison with Prior Nuclear Study: 2013 - mild apical thinning, non-gated  Overall Impression: Low risk stress nuclear study with small fixed inferoapical defect, which could represent thinning. No ischemia.    Assessment & Plan  1. CAD - stable, continue ASA, lipitor, metoprolol XL, negative stress test on 05/31/14, no more chest pain, stays active. EKG shows unchanged nonspecific changes in the lateral leads patient is asymptomatic and not further ischemic workup necessary at this point.  2. Chronic systolic CHF - LVEF 83-41%, on lasix 40 mg po daily, the patient has slightly improved  chronic pitting LE edema, the patient used diet stockings at night as he can't put the compression stocking on. He is encouraged to continue doing that. We'll stop potassium supplements and just encouraged to drink orange juice  and eat food rich in potassium.  3. Carotid disease - His last sono carotid on 09/12/14 was unchanged from prior, with chronic right ICA occlusion and  < 40% stenosis in left ICA. We will recheck at the next visit.  4. Chronic persistent A-fib - rate controlled, INR therapeutic, no falls, we will continue Coumadin for now.  5. Lipids - all at goal , continue atorvastatin 10 mg po daily.  Follow-up in 6 months.   Dorothy Spark, MD, [facility] 07/09/2015, 2:13 PM

## 2015-07-09 NOTE — Patient Instructions (Signed)
Medication Instructions:  Same-no changes  Labwork: None  Testing/Procedures: None  Follow-Up: Your physician wants you to follow-up in: 6 months. You will receive a reminder letter in the mail two months in advance. If you don't receive a letter, please call our office to schedule the follow-up appointment.

## 2015-07-22 ENCOUNTER — Other Ambulatory Visit: Payer: Self-pay | Admitting: Family Medicine

## 2015-07-26 ENCOUNTER — Ambulatory Visit (INDEPENDENT_AMBULATORY_CARE_PROVIDER_SITE_OTHER): Payer: Medicare Other | Admitting: General Practice

## 2015-07-26 DIAGNOSIS — Z23 Encounter for immunization: Secondary | ICD-10-CM

## 2015-07-26 DIAGNOSIS — Z5181 Encounter for therapeutic drug level monitoring: Secondary | ICD-10-CM | POA: Diagnosis not present

## 2015-07-26 DIAGNOSIS — I4891 Unspecified atrial fibrillation: Secondary | ICD-10-CM

## 2015-07-26 LAB — POCT INR: INR: 3

## 2015-07-26 NOTE — Progress Notes (Signed)
Pre visit review using our clinic review tool, if applicable. No additional management support is needed unless otherwise documented below in the visit note. 

## 2015-07-27 ENCOUNTER — Other Ambulatory Visit: Payer: Self-pay | Admitting: Cardiology

## 2015-08-23 ENCOUNTER — Ambulatory Visit: Payer: Medicare Other

## 2015-08-27 ENCOUNTER — Ambulatory Visit (INDEPENDENT_AMBULATORY_CARE_PROVIDER_SITE_OTHER): Payer: Medicare Other | Admitting: General Practice

## 2015-08-27 DIAGNOSIS — Z5181 Encounter for therapeutic drug level monitoring: Secondary | ICD-10-CM | POA: Diagnosis not present

## 2015-08-27 DIAGNOSIS — I4891 Unspecified atrial fibrillation: Secondary | ICD-10-CM

## 2015-08-27 LAB — POCT INR: INR: 2.4

## 2015-08-27 NOTE — Progress Notes (Signed)
Pre visit review using our clinic review tool, if applicable. No additional management support is needed unless otherwise documented below in the visit note. 

## 2015-08-28 ENCOUNTER — Ambulatory Visit (INDEPENDENT_AMBULATORY_CARE_PROVIDER_SITE_OTHER): Payer: Medicare Other | Admitting: Podiatry

## 2015-08-28 ENCOUNTER — Encounter: Payer: Self-pay | Admitting: Podiatry

## 2015-08-28 DIAGNOSIS — M79673 Pain in unspecified foot: Secondary | ICD-10-CM

## 2015-08-28 DIAGNOSIS — B351 Tinea unguium: Secondary | ICD-10-CM

## 2015-08-28 NOTE — Progress Notes (Signed)
Patient ID: [name] [name], male   DOB: 1925/08/03, 81 y.o.   MRN: [id number] HPI  Complaint:  Visit Type: Patient returns to my office for continued preventative foot care services. Complaint: Patient states" my nails have grown long and thick and become painful to walk and wear shoes" . He presents for preventative foot care services. No changes to ROS  Podiatric Exam: Vascular: dorsalis pedis and posterior tibial pulses are negative. Capillary return is immediate. Temperature gradient is negative. Skin turgor WNL,   Sensorium: Normal Semmes Weinstein monofilament test. Normal tactile sensation bilaterally.  Nail Exam: Pt has thick disfigured discolored nails with subungual debris noted bilateral entire nail hallux through fifth toenails Ulcer Exam: There is no evidence of ulcer or pre-ulcerative changes or infection. Orthopedic Exam: Muscle tone and strength are WNL. No limitations in general ROM. No crepitus or effusions noted. Foot type and digits show no abnormalities. Bony prominences are unremarkable. Skin: No Porokeratosis. No infection or ulcers  Diagnosis:  Tinea unguium, Pain in right toe, pain in left toes  Treatment & Plan Procedures and Treatment: Consent by patient was obtained for treatment procedures. The patient understood the discussion of treatment and procedures well. All questions were answered thoroughly reviewed. Debridement of mycotic and hypertrophic toenails, 1 through 5 bilateral and clearing of subungual debris. No ulceration, no infection noted.  Return Visit-Office Procedure: Patient instructed to return to the office for a follow up visit 3 months for continued evaluation and treatment.

## 2015-09-14 ENCOUNTER — Encounter: Payer: Self-pay | Admitting: Family

## 2015-09-18 ENCOUNTER — Other Ambulatory Visit (HOSPITAL_COMMUNITY): Payer: Medicare Other

## 2015-09-18 ENCOUNTER — Ambulatory Visit: Payer: Medicare Other | Admitting: Family

## 2015-09-19 ENCOUNTER — Encounter: Payer: Self-pay | Admitting: Family

## 2015-09-19 ENCOUNTER — Ambulatory Visit (INDEPENDENT_AMBULATORY_CARE_PROVIDER_SITE_OTHER): Payer: Medicare Other | Admitting: Family

## 2015-09-19 ENCOUNTER — Ambulatory Visit (HOSPITAL_COMMUNITY)
Admission: RE | Admit: 2015-09-19 | Discharge: 2015-09-19 | Disposition: A | Discharge status: Home / Self Care | Payer: Medicare Other | Source: Ambulatory Visit | Attending: Vascular Surgery | Admitting: Vascular Surgery

## 2015-09-19 ENCOUNTER — Other Ambulatory Visit: Payer: Self-pay | Admitting: Family

## 2015-09-19 VITALS — BP 130/59 | HR 58 | Temp 97.0°F | Resp 18 | Ht 72.0 in | Wt 168.0 lb

## 2015-09-19 DIAGNOSIS — I6523 Occlusion and stenosis of bilateral carotid arteries: Secondary | ICD-10-CM | POA: Diagnosis not present

## 2015-09-19 DIAGNOSIS — I779 Disorder of arteries and arterioles, unspecified: Secondary | ICD-10-CM

## 2015-09-19 DIAGNOSIS — E785 Hyperlipidemia, unspecified: Secondary | ICD-10-CM | POA: Diagnosis not present

## 2015-09-19 DIAGNOSIS — I739 Peripheral vascular disease, unspecified: Secondary | ICD-10-CM

## 2015-09-19 DIAGNOSIS — I10 Essential (primary) hypertension: Secondary | ICD-10-CM | POA: Insufficient documentation

## 2015-09-19 NOTE — Progress Notes (Signed)
Established Carotid Patient   History of Present Illness  [name] [name] is a 81 y.o. male patient of Dr. Kellie Simmering who has a known right ICA occlusion and mild to moderate left ICA stenosis. He returns today for routine surveillance.   He denies any neurologic symptoms such as lateralizing weakness, aphasia, amaurosis fugax, diplopia, blurred vision, and syncope.  He takes Coumadin on a daily basis for A. fib He denies claudication symptoms with walking, denies non healing wounds. Patient has not had previous carotid artery intervention.  The patient reports New Medical or Surgical History: on 02/21/15 he had a right 3rd Ray Amputation by Dr. Sharol Given for infected right 3rd toe; this was after a corn was removed. This has completely healed.  He reports being less active since his right foot surgery in March, denies any restrictions on his activity; states he will start using his stationary bike and hand weights.  Pt Diabetic: no Pt smoker: former smoker, quit in the 1990's  Pt meds include: Statin : yes ASA: yes Other anticoagulants/antiplatelets: Warfarin    Past Medical History  Diagnosis Date  . Carotid artery occlusion     right total internal artery occlusion  . Coronary artery disease     s/p cardiac cath in 1990s, and cardiolite study  in 2005 showing EF 54%  . Hyperlipidemia   . Hypertension   . TIA (transient ischemic attack)   . Degenerative disc disease     cervical spine  . Arrhythmia   . Atrial fibrillation ([place]) 03/2011  . Myocardial infarction ([place]) 1992 and 1994  . Angina     "jaw & elbow was the worse"  . Pneumonia ~ 1935  . GERD (gastroesophageal reflux disease)   . Skin cancer     forehead  . Urgency incontinence   . Bilateral cellulitis of lower leg     Social History Social History  Substance Use Topics  . Smoking status: Former Smoker -- 1.00 packs/day for 40 years    Types: Cigarettes    Quit date: 09/12/1989  . Smokeless tobacco: Former Systems developer     Quit date: 11/26/1990     Comment: quit around 1992  . Alcohol Use: No     Comment: former, quit drinking in 1992    Family History Family History  Problem Relation Age of Onset  . Heart disease Mother   . Malignant hyperthermia Mother   . Hypertension Mother   . Heart disease Father   . Malignant hyperthermia Father   . Hyperlipidemia Father   . Hypertension Father   . Heart disease Sister   . Hypertension Sister   . Heart disease Brother     Surgical History Past Surgical History  Procedure Laterality Date  . Cataract extraction w/ intraocular lens  implant, bilateral    . Cardiac catheterization  1990s  . Skin cancer excision  12/2011    forehead  . Amputation Right 02/21/2015    Procedure: Right 3rd Ray Amputation;  Surgeon: Newt Minion, MD;  Location: [place];  Service: Orthopedics;  Laterality: Right;  Digital block and ankle block with MAC    Allergies  Allergen Reactions  . Levofloxacin Rash    "thought they were sticking swords thru me"  . Klor-Con [Potassium Chloride Er] Diarrhea    DIARRHEA    Current Outpatient Prescriptions  Medication Sig Dispense Refill  . aspirin 81 MG tablet Take 81 mg by mouth at bedtime.     Marland Kitchen atorvastatin (LIPITOR) 10  MG tablet TAKE 1 TABLET AT BEDTIME 90 tablet 0  . Calcium-Vitamin D (CALTRATE 600 PLUS-VIT D PO) Take 1 tablet by mouth at bedtime.     . digoxin (LANOXIN) 0.125 MG tablet Take 1 tablet (0.125 mg total) by mouth daily. 90 tablet 1  . Fish Oil-Krill Oil (KRILL OIL PLUS PO) Take 1 tablet by mouth daily. Krill Oil-Omega 3- DHA-EPA 300-90 (27-45) mg    . furosemide (LASIX) 40 MG tablet TAKE 1 TABLET DAILY 90 tablet 2  . levothyroxine (SYNTHROID, LEVOTHROID) 100 MCG tablet TAKE 1 TABLET DAILY (OFFICE VISIT FOR MORE REFILLS) 90 tablet 0  . loperamide (IMODIUM) 2 MG capsule Take 1 capsule (2 mg total) by mouth as needed for diarrhea or loose stools (max 8 mg daily). 30 capsule 0  . methotrexate (RHEUMATREX) 2.5 MG tablet  Take 7.5 mg by mouth 2 (two) times a week. Saturday and Sunday    . Misc Natural Products (PROSTATE HEALTH PO) Take 2 tablets by mouth daily. 160-100-100    . Multiple Vitamin (MULTIVITAMIN WITH MINERALS) TABS tablet Take 1 tablet by mouth daily.    . Multiple Vitamins-Minerals (MEGA MULTI MEN PO) Take 1 tablet by mouth daily. 200-175-250 mcg    . Multiple Vitamins-Minerals (OCUVITE PO) Take 1 tablet by mouth daily.    . nitroGLYCERIN (NITROSTAT) 0.4 MG SL tablet Place 1 tablet (0.4 mg total) under the tongue every 5 (five) minutes as needed for chest pain. 30 tablet 0  . Pediatric Multivitamins-Fl (MULTIPLE VITAMINS/FLUORIDE) 1 MG CHEW Chew 1 tablet by mouth daily.    Marland Kitchen spironolactone (ALDACTONE) 25 MG tablet Take 25 mg by mouth daily.    . traMADol (ULTRAM) 50 MG tablet Take 1 tablet (50 mg total) by mouth every 6 (six) hours as needed for moderate pain. Take 1-2 tablets by mouth daily as needed for arthritis 30 tablet 0  . warfarin (COUMADIN) 5 MG tablet Take as directed by anticoagulation clinic. 120 tablet 1  . [DISCONTINUED] diphenhydrAMINE (BENADRYL) 25 MG tablet Take 25 mg by mouth every 6 (six) hours as needed. For itching    . [DISCONTINUED] methotrexate (RHEUMATREX) 2.5 MG tablet Take 10 mg by mouth once a week. Sunday     No current facility-administered medications for this visit.    Review of Systems : See HPI for pertinent positives and negatives.  Physical Examination  Filed Vitals:   09/19/15 1456  BP: 127/57  Pulse: 62  Temp: 97 F (36.1 C)  Resp: 18  Height: 6' (1.829 m)  Weight: 168 lb (76.204 kg)  SpO2: 99%   Body mass index is 22.78 kg/(m^2).  General: WDWN male in NAD GAIT: slow, deliberate Eyes: PERRLA Pulmonary:  Non-labored, CTAB, no rales, no rhonchi, & no wheezing.  Cardiac: irregular rhythm,  no detected murmur.  VASCULAR EXAM Carotid Bruits Right Left   Negative Negative    Aorta is not palpable. Radial pulses are 2+ palpable and equal.  LE Pulses Right Left       POPLITEAL  not palpable   not palpable       POSTERIOR TIBIAL  not palpable   not palpable        DORSALIS PEDIS      ANTERIOR TIBIAL 1+ palpable  faintly palpable     Gastrointestinal: soft, nontender, BS WNL, no r/g,  no palpable masses.  Musculoskeletal: no muscle atrophy/wasting. M/S 5/5 throughout, extremities without ischemic changes. Both feet and lower legs with venous stasis changes, dry skin, no ulcers, 1+ bilateral pitting edema. Right third toe surgically absent.   Neurologic: A&O X 3; Appropriate Affect, Speech is normal CN 2-12 intact, pain and light touch intact in extremities, Motor exam as listed above.   Non-Invasive Vascular Imaging CAROTID DUPLEX 09/19/2015   CEREBROVASCULAR DUPLEX EVALUATION    INDICATION: Carotid artery stenosis    PREVIOUS INTERVENTION(S): NA    DUPLEX EXAM:     RIGHT  LEFT  Peak Systolic Velocities (cm/s) End Diastolic Velocities (cm/s) Plaque LOCATION Peak Systolic Velocities (cm/s) End Diastolic Velocities (cm/s) Plaque  32 0  CCA PROXIMAL 80 12   47 3  CCA MID 101 24   41 5  CCA DISTAL 90 24 HT  185 12 HT ECA 182 0 HT  0 0 occluded ICA PROXIMAL 195 48 HT  0 0 occluded ICA MID 88 25   0 0 occluded ICA DISTAL 112 23     ICA occlusion ICA / CCA Ratio (PSV) 2.44  Antegrade Vertebral Flow Antegrade  NA Brachial Systolic Pressure (mmHg) NA  NA Brachial Artery Waveforms NA    Plaque Morphology:  HM = Homogeneous, HT = Heterogeneous, CP = Calcific Plaque, SP = Smooth Plaque, IP = Irregular Plaque     ADDITIONAL FINDINGS: Right subclavian artery PSV 123cm/sec ; Left subclavian artery PSV159cm/sec    IMPRESSION: No color flow or spectral Doppler waveform is obtained involving the right internal carotid artery, known internal carotid artery occlusion. Left internal carotid artery stenosis  present in the 40%-59% range. Bilateral external carotid artery stenosis present.    Compared to the previous exam:  Increase in disease on the left and unchanged on the right since study on 09/12/2014.      Assessment: [name] [name] is a 82 y.o. male who has no recent history of stroke or TIA. Today's carotid duplex suggests no color flow or spectral Doppler waveform is obtained involving the right internal carotid artery, known internal carotid artery occlusion. Left internal carotid artery stenosis present in the 40%-59% range. Increase in disease on the left and unchanged on the right since study on 09/12/2014.  Plan: Follow-up in 1 year with Carotid Duplex scan.   I discussed in depth with the patient the nature of atherosclerosis, and emphasized the importance of maximal medical management including strict control of blood pressure, blood glucose, and lipid levels, obtaining regular exercise, and continued cessation of smoking.  The patient is aware that without maximal medical management the underlying atherosclerotic disease process will progress, limiting the benefit of any interventions. The patient was given information about stroke prevention and what symptoms should prompt the patient to seek immediate medical care. Thank you for allowing [place] to participate in this patient's care.  Clemon Chambers, RN, MSN, FNP-C [facility] Office: [phone]  Clinic Physician: Scot Dock  09/19/2015 2:58 PM

## 2015-09-19 NOTE — Patient Instructions (Addendum)
Stroke Prevention Some medical conditions and behaviors are associated with an increased chance of having a stroke. You may prevent a stroke by making healthy choices and managing medical conditions. HOW CAN I REDUCE MY RISK OF HAVING A STROKE?   Stay physically active. Get at least 30 minutes of activity on most or all days.  Do not smoke. It may also be helpful to avoid exposure to secondhand smoke.  Limit alcohol use. Moderate alcohol use is considered to be:  No more than 2 drinks per day for men.  No more than 1 drink per day for nonpregnant women.  Eat healthy foods. This involves:  Eating 5 or more servings of fruits and vegetables a day.  Making dietary changes that address high blood pressure (hypertension), high cholesterol, diabetes, or obesity.  Manage your cholesterol levels.  Making food choices that are high in fiber and low in saturated fat, trans fat, and cholesterol may control cholesterol levels.  Take any prescribed medicines to control cholesterol as directed by your health care provider.  Manage your diabetes.  Controlling your carbohydrate and sugar intake is recommended to manage diabetes.  Take any prescribed medicines to control diabetes as directed by your health care provider.  Control your hypertension.  Making food choices that are low in salt (sodium), saturated fat, trans fat, and cholesterol is recommended to manage hypertension.  Ask your health care provider if you need treatment to lower your blood pressure. Take any prescribed medicines to control hypertension as directed by your health care provider.  If you are 18-39 years of age, have your blood pressure checked every 3-5 years. If you are 40 years of age or older, have your blood pressure checked every year.  Maintain a healthy weight.  Reducing calorie intake and making food choices that are low in sodium, saturated fat, trans fat, and cholesterol are recommended to manage  weight.  Stop drug abuse.  Avoid taking birth control pills.  Talk to your health care provider about the risks of taking birth control pills if you are over 35 years old, smoke, get migraines, or have ever had a blood clot.  Get evaluated for sleep disorders (sleep apnea).  Talk to your health care provider about getting a sleep evaluation if you snore a lot or have excessive sleepiness.  Take medicines only as directed by your health care provider.  For some people, aspirin or blood thinners (anticoagulants) are helpful in reducing the risk of forming abnormal blood clots that can lead to stroke. If you have the irregular heart rhythm of atrial fibrillation, you should be on a blood thinner unless there is a good reason you cannot take them.  Understand all your medicine instructions.  Make sure that other conditions (such as anemia or atherosclerosis) are addressed. SEEK IMMEDIATE MEDICAL CARE IF:   You have sudden weakness or numbness of the face, arm, or leg, especially on one side of the body.  Your face or eyelid droops to one side.  You have sudden confusion.  You have trouble speaking (aphasia) or understanding.  You have sudden trouble seeing in one or both eyes.  You have sudden trouble walking.  You have dizziness.  You have a loss of balance or coordination.  You have a sudden, severe headache with no known cause.  You have new chest pain or an irregular heartbeat. Any of these symptoms may represent a serious problem that is an emergency. Do not wait to see if the symptoms will   go away. Get medical help at once. Call your local emergency services (911 in [place] Do not drive yourself to the hospital.   This information is not intended to replace advice given to you by your health care provider. Make sure you discuss any questions you have with your health care provider.   Document Released: 12/18/2004 Document Revised: 12/01/2014 Document Reviewed:  05/13/2013 Elsevier Interactive Patient Education 2016 Elsevier Inc[employer]     Venous Stasis or Chronic Venous Insufficiency Chronic venous insufficiency, also called venous stasis, is a condition that affects the veins in the legs. The condition prevents blood from being pumped through these veins effectively. Blood may no longer be pumped effectively from the legs back to the heart. This condition can range from mild to severe. With proper treatment, you should be able to continue with an active life. CAUSES  Chronic venous insufficiency occurs when the vein walls become stretched, weakened, or damaged or when valves within the vein are damaged. Some common causes of this include:  High blood pressure inside the veins (venous hypertension).  Increased blood pressure in the leg veins from long periods of sitting or standing.  A blood clot that blocks blood flow in a vein (deep vein thrombosis).  Inflammation of a superficial vein (phlebitis) that causes a blood clot to form. RISK FACTORS Various things can make you more likely to develop chronic venous insufficiency, including:  Family history of this condition.  Obesity.  Pregnancy.  Sedentary lifestyle.  Smoking.  Jobs requiring long periods of standing or sitting in one place.  Being a certain age. Women in their 40s and 50s and men in their 70s are more likely to develop this condition. SIGNS AND SYMPTOMS  Symptoms may include:   Varicose veins.  Skin breakdown or ulcers.  Reddened or discolored skin on the leg.  Brown, smooth, tight, and painful skin just above the ankle, usually on the inside surface (lipodermatosclerosis).  Swelling. DIAGNOSIS  To diagnose this condition, your health care provider will take a medical history and do a physical exam. The following tests may be ordered to confirm the diagnosis:  Duplex ultrasound--A procedure that produces a picture of a blood vessel and nearby organs and also  provides information on blood flow through the blood vessel.  Plethysmography--A procedure that tests blood flow.  A venogram, or venography--A procedure used to look at the veins using X-ray and dye. TREATMENT The goals of treatment are to help you return to an active life and to minimize pain or disability. Treatment will depend on the severity of the condition. Medical procedures may be needed for severe cases. Treatment options may include:   Use of compression stockings. These can help with symptoms and lower the chances of the problem getting worse, but they do not cure the problem.  Sclerotherapy--A procedure involving an injection of a material that "dissolves" the damaged veins. Other veins in the network of blood vessels take over the function of the damaged veins.  Surgery to remove the vein or cut off blood flow through the vein (vein stripping or laser ablation surgery).  Surgery to repair a valve. HOME CARE INSTRUCTIONS   Wear compression stockings as directed by your health care provider.  Only take over-the-counter or prescription medicines for pain, discomfort, or fever as directed by your health care provider.  Follow up with your health care provider as directed. SEEK MEDICAL CARE IF:   You have redness, swelling, or increasing pain in the affected area.    You see a red streak or line that extends up or down from the affected area.  You have a breakdown or loss of skin in the affected area, even if the breakdown is small.  You have an injury to the affected area. SEEK IMMEDIATE MEDICAL CARE IF:   You have an injury and open wound in the affected area.  Your pain is severe and does not improve with medicine.  You have sudden numbness or weakness in the foot or ankle below the affected area, or you have trouble moving your foot or ankle.  You have a fever or persistent symptoms for more than 2-3 days.  You have a fever and your symptoms suddenly get  worse. MAKE SURE YOU:   Understand these instructions.  Will watch your condition.  Will get help right away if you are not doing well or get worse.   This information is not intended to replace advice given to you by your health care provider. Make sure you discuss any questions you have with your health care provider.   Document Released: 03/16/2007 Document Revised: 08/31/2013 Document Reviewed: 07/18/2013 Elsevier Interactive Patient Education 2016 Elsevier Inc.  

## 2015-09-24 ENCOUNTER — Ambulatory Visit (INDEPENDENT_AMBULATORY_CARE_PROVIDER_SITE_OTHER): Payer: Medicare Other | Admitting: General Practice

## 2015-09-24 DIAGNOSIS — I4891 Unspecified atrial fibrillation: Secondary | ICD-10-CM

## 2015-09-24 DIAGNOSIS — Z5181 Encounter for therapeutic drug level monitoring: Secondary | ICD-10-CM

## 2015-09-24 LAB — POCT INR: INR: 2.5

## 2015-09-24 NOTE — Progress Notes (Signed)
Pre visit review using our clinic review tool, if applicable. No additional management support is needed unless otherwise documented below in the visit note. 

## 2015-09-25 ENCOUNTER — Other Ambulatory Visit: Payer: Self-pay | Admitting: Family Medicine

## 2015-09-25 DIAGNOSIS — M542 Cervicalgia: Secondary | ICD-10-CM | POA: Diagnosis not present

## 2015-09-25 DIAGNOSIS — M0609 Rheumatoid arthritis without rheumatoid factor, multiple sites: Secondary | ICD-10-CM | POA: Diagnosis not present

## 2015-09-25 DIAGNOSIS — Z79899 Other long term (current) drug therapy: Secondary | ICD-10-CM | POA: Diagnosis not present

## 2015-09-25 DIAGNOSIS — M79642 Pain in left hand: Secondary | ICD-10-CM | POA: Diagnosis not present

## 2015-09-25 DIAGNOSIS — M79641 Pain in right hand: Secondary | ICD-10-CM | POA: Diagnosis not present

## 2015-10-19 ENCOUNTER — Other Ambulatory Visit: Payer: Self-pay | Admitting: Family Medicine

## 2015-10-21 ENCOUNTER — Other Ambulatory Visit: Payer: Self-pay | Admitting: Cardiology

## 2015-10-22 ENCOUNTER — Ambulatory Visit (INDEPENDENT_AMBULATORY_CARE_PROVIDER_SITE_OTHER): Payer: Medicare Other | Admitting: General Practice

## 2015-10-22 DIAGNOSIS — I4891 Unspecified atrial fibrillation: Secondary | ICD-10-CM | POA: Diagnosis not present

## 2015-10-22 DIAGNOSIS — Z5181 Encounter for therapeutic drug level monitoring: Secondary | ICD-10-CM

## 2015-10-22 LAB — POCT INR: INR: 2.3

## 2015-10-22 NOTE — Progress Notes (Signed)
Pre visit review using our clinic review tool, if applicable. No additional management support is needed unless otherwise documented below in the visit note. 

## 2015-10-23 ENCOUNTER — Other Ambulatory Visit: Payer: Self-pay | Admitting: Cardiology

## 2015-11-08 ENCOUNTER — Ambulatory Visit (INDEPENDENT_AMBULATORY_CARE_PROVIDER_SITE_OTHER): Payer: Medicare Other | Admitting: Podiatry

## 2015-11-08 ENCOUNTER — Encounter: Payer: Self-pay | Admitting: Podiatry

## 2015-11-08 DIAGNOSIS — M79673 Pain in unspecified foot: Secondary | ICD-10-CM | POA: Diagnosis not present

## 2015-11-08 DIAGNOSIS — B351 Tinea unguium: Secondary | ICD-10-CM | POA: Diagnosis not present

## 2015-11-08 NOTE — Progress Notes (Signed)
Patient ID: [name] [name], male   DOB: Jun 15, 1925, 81 y.o.   MRN: [id number] HPI  Complaint:  Visit Type: Patient returns to my office for continued preventative foot care services. Complaint: Patient states" my nails have grown long and thick and become painful to walk and wear shoes" . He presents for preventative foot care services. No changes to ROS  Podiatric Exam: Vascular: dorsalis pedis and posterior tibial pulses are negative. Capillary return is immediate. Temperature gradient is negative. Skin turgor WNL,   Sensorium: Normal Semmes Weinstein monofilament test. Normal tactile sensation bilaterally.  Nail Exam: Pt has thick disfigured discolored nails with subungual debris noted bilateral entire nail hallux through fifth toenails Ulcer Exam: There is no evidence of ulcer or pre-ulcerative changes or infection. Orthopedic Exam: Muscle tone and strength are WNL. No limitations in general ROM. No crepitus or effusions noted. Foot type and digits show no abnormalities. Bony prominences are unremarkable. Amputation third toe right foot. Skin: No Porokeratosis. No infection or ulcers  Diagnosis:  Tinea unguium, Pain in right toe, pain in left toes  Treatment & Plan Procedures and Treatment: Consent by patient was obtained for treatment procedures. The patient understood the discussion of treatment and procedures well. All questions were answered thoroughly reviewed. Debridement of mycotic and hypertrophic toenails, 1 through 5 bilateral and clearing of subungual debris. No ulceration, no infection noted.  Return Visit-Office Procedure: Patient instructed to return to the office for a follow up visit 10 weeks for continued evaluation and treatment.  Gardiner Barefoot DPM

## 2015-11-27 DIAGNOSIS — X32XXXD Exposure to sunlight, subsequent encounter: Secondary | ICD-10-CM | POA: Diagnosis not present

## 2015-11-27 DIAGNOSIS — L570 Actinic keratosis: Secondary | ICD-10-CM | POA: Diagnosis not present

## 2015-12-03 ENCOUNTER — Ambulatory Visit: Payer: Medicare Other

## 2015-12-06 ENCOUNTER — Ambulatory Visit: Payer: Medicare Other

## 2015-12-13 ENCOUNTER — Ambulatory Visit (INDEPENDENT_AMBULATORY_CARE_PROVIDER_SITE_OTHER): Payer: Medicare Other | Admitting: General Practice

## 2015-12-13 DIAGNOSIS — I4891 Unspecified atrial fibrillation: Secondary | ICD-10-CM

## 2015-12-13 DIAGNOSIS — Z5181 Encounter for therapeutic drug level monitoring: Secondary | ICD-10-CM

## 2015-12-13 LAB — POCT INR: INR: 2

## 2015-12-22 ENCOUNTER — Other Ambulatory Visit: Payer: Self-pay | Admitting: Family Medicine

## 2015-12-28 DIAGNOSIS — Z79899 Other long term (current) drug therapy: Secondary | ICD-10-CM | POA: Diagnosis not present

## 2015-12-28 DIAGNOSIS — M0609 Rheumatoid arthritis without rheumatoid factor, multiple sites: Secondary | ICD-10-CM | POA: Diagnosis not present

## 2016-01-07 ENCOUNTER — Ambulatory Visit (INDEPENDENT_AMBULATORY_CARE_PROVIDER_SITE_OTHER): Payer: Medicare Other | Admitting: Cardiology

## 2016-01-07 ENCOUNTER — Encounter: Payer: Self-pay | Admitting: Cardiology

## 2016-01-07 VITALS — BP 124/80 | HR 64 | Ht 72.0 in | Wt 165.0 lb

## 2016-01-07 DIAGNOSIS — I482 Chronic atrial fibrillation, unspecified: Secondary | ICD-10-CM

## 2016-01-07 DIAGNOSIS — I5022 Chronic systolic (congestive) heart failure: Secondary | ICD-10-CM | POA: Diagnosis not present

## 2016-01-07 DIAGNOSIS — I10 Essential (primary) hypertension: Secondary | ICD-10-CM

## 2016-01-07 DIAGNOSIS — I509 Heart failure, unspecified: Secondary | ICD-10-CM | POA: Diagnosis not present

## 2016-01-07 DIAGNOSIS — I2510 Atherosclerotic heart disease of native coronary artery without angina pectoris: Secondary | ICD-10-CM | POA: Diagnosis not present

## 2016-01-07 DIAGNOSIS — I4891 Unspecified atrial fibrillation: Secondary | ICD-10-CM | POA: Diagnosis not present

## 2016-01-07 DIAGNOSIS — I359 Nonrheumatic aortic valve disorder, unspecified: Secondary | ICD-10-CM | POA: Diagnosis not present

## 2016-01-07 DIAGNOSIS — E785 Hyperlipidemia, unspecified: Secondary | ICD-10-CM

## 2016-01-07 DIAGNOSIS — I350 Nonrheumatic aortic (valve) stenosis: Secondary | ICD-10-CM

## 2016-01-07 LAB — COMPREHENSIVE METABOLIC PANEL
ALT: 30 U/L (ref 9–46)
AST: 42 U/L — ABNORMAL HIGH (ref 10–35)
Albumin: 3.7 g/dL (ref 3.6–5.1)
Alkaline Phosphatase: 58 U/L (ref 40–115)
BUN: 12 mg/dL (ref 7–25)
CO2: 33 mmol/L — ABNORMAL HIGH (ref 20–31)
Calcium: 9.1 mg/dL (ref 8.6–10.3)
Chloride: 98 mmol/L (ref 98–110)
Creat: 1.23 mg/dL — ABNORMAL HIGH (ref 0.70–1.11)
Glucose, Bld: 68 mg/dL (ref 65–99)
Potassium: 3.5 mmol/L (ref 3.5–5.3)
Sodium: 140 mmol/L (ref 135–146)
Total Bilirubin: 0.4 mg/dL (ref 0.2–1.2)
Total Protein: 6.5 g/dL (ref 6.1–8.1)

## 2016-01-07 LAB — CBC
HCT: 31.1 % — ABNORMAL LOW (ref 39.0–52.0)
Hemoglobin: 10.1 g/dL — ABNORMAL LOW (ref 13.0–17.0)
MCH: 29.5 pg (ref 26.0–34.0)
MCHC: 32.5 g/dL (ref 30.0–36.0)
MCV: 90.9 fL (ref 78.0–100.0)
MPV: 10.2 fL (ref 8.6–12.4)
Platelets: 220 10*3/uL (ref 150–400)
RBC: 3.42 MIL/uL — ABNORMAL LOW (ref 4.22–5.81)
RDW: 18.6 % — ABNORMAL HIGH (ref 11.5–15.5)
WBC: 7.5 10*3/uL (ref 4.0–10.5)

## 2016-01-07 NOTE — Progress Notes (Signed)
Patient ID: [name] [name], male   DOB: [birth year], 82 y.o.   MRN: [id number]    Patient Name: [name] [name] Date of Encounter: 01/07/2016  Primary Care Provider:  [name], MD Primary Cardiologist:  [name] [name] (formerly Dr. Verl [name])  Problem List   Past Medical History  Diagnosis Date  . Carotid artery occlusion     right total internal artery occlusion  . Coronary artery disease     s/p cardiac cath in 1990s, and cardiolite study  in 2005 showing EF 54%  . Hyperlipidemia   . Hypertension   . TIA (transient ischemic attack)   . Degenerative disc disease     cervical spine  . Arrhythmia   . Atrial fibrillation ([place]) 03/2011  . Myocardial infarction ([place]) 1992 and 1994  . Angina     "jaw & elbow was the worse"  . Pneumonia ~ 1935  . GERD (gastroesophageal reflux disease)   . Skin cancer     forehead  . Urgency incontinence   . Bilateral cellulitis of lower leg    Past Surgical History  Procedure Laterality Date  . Cataract extraction w/ intraocular lens  implant, bilateral    . Cardiac catheterization  1990s  . Skin cancer excision  12/2011    forehead  . Amputation Right 02/21/2015    Procedure: Right 3rd Ray Amputation;  Surgeon: [name] Minion, MD;  Location: [facility];  Service: Orthopedics;  Laterality: Right;  Digital block and ankle block with MAC    Allergies  Allergies  Allergen Reactions  . Levofloxacin Rash    "thought they were sticking swords thru me"  . Klor-Con [Potassium Chloride Er] Diarrhea    DIARRHEA    HPI  [name] [name] returns today for evaluation and management of his history of coronary artery disease, history of chronic A. fib, anticoagulation. He also has carotid artery disease which is followed by the vascular surgery group.  He denies any symptoms of TIAs. His last sono carotid on 09/12/14 was unchanged from prior, with chronic right ICA occlusion and  < 40% stenosis in left ICA. He still very active doing all of his yard  work. He was scraping some snow this morning. He denies any chest pain, shortness of breath, palpitations presyncope or syncope. He denies any bleeding or melena. He is overdue for blood work.  07/10/2014 - no complaints, no more chest pain, SOB or palpitations. Stable LE edema.   10/12/2014 - the patient is coming for 3 months follow-up, he continues to have same all over extremity edema. He is unable to prolonged compression stockings. But he is veering on a regular stocking they're giving him significant compression. Patient states that he spends most of his stay on his feet as he is working around the house and he is taking care of his wife with Alzheimer. He is able to lay flat at night and denies any angina, dyspnea on exertion, orthopnea or paroxysmal nocturnal dyspnea.  06/25/2014 - the patient is coming after 3 months, at the last visit we increased his Lasix to 40 mg daily. And we started him on potassium supplements as his potassium was 3.2. The patient states that his lower extremity edema has slightly improved. He is hard time swallowing large potassium pills. He denies any shortness of breath, chest pain palpitations or syncope. He has been married to his wife for 21 years she currently has severe Alzheimer and she is her primary caretaker.  01/07/2016 -  6 months follow up, the patient states that he feels well, he continues to have LE edema, but is stable and improved in the mornings.  He denies any bleeding but has easy bruising that is stable.  He had an episode of chest pain about 2 months ago that responded to sublingual nitroglycerin that he always carries with him. He denies any dizziness presyncope or syncope no palpitations. No orthopnea or personal nocturnal dyspnea. No falls. He still very independent and walks without help of cane or walker.   Home Medications  Prior to Admission medications   Medication Sig Start Date End Date Taking? Authorizing Provider  aspirin 81 MG  tablet Take 81 mg by mouth at bedtime.     Historical Provider, MD  atorvastatin (LIPITOR) 10 MG tablet TAKE 1 TABLET AT BEDTIME 10/03/13   [name] Spark, MD  Calcium-Vitamin D (CALTRATE 600 PLUS-VIT D PO) Take 1 tablet by mouth at bedtime.     Historical Provider, MD  digoxin (LANOXIN) 0.125 MG tablet Take 0.125 mg by mouth daily.    Historical Provider, MD  furosemide (LASIX) 20 MG tablet Take 20 mg by mouth daily.    Historical Provider, MD  levothyroxine (SYNTHROID, LEVOTHROID) 100 MCG tablet TAKE 1 TABLET DAILY 09/22/13   Dorena Cookey, MD  methotrexate (RHEUMATREX) 2.5 MG tablet Take 5-7.5 mg by mouth 2 (two) times a week. Take 3 tablets on Saturday and 2 tablets on sunday    Historical Provider, MD  metoprolol succinate (TOPROL-XL) 50 MG 24 hr tablet Take 50 mg by mouth 2 (two) times daily. Take with or immediately following a meal.    Historical Provider, MD  metoprolol succinate (TOPROL-XL) 50 MG 24 hr tablet TAKE 1 TABLET TWICE A DAY 11/27/13   [name] Spark, MD  Multiple Vitamin (MULTIVITAMIN WITH MINERALS) TABS tablet Take 1 tablet by mouth daily.    Historical Provider, MD  nitroGLYCERIN (NITROSTAT) 0.4 MG SL tablet Place 0.4 mg under the tongue every 5 (five) minutes as needed. For chest pain 02/24/11   Renella Cunas, MD  potassium chloride SA (K-DUR,KLOR-CON) 20 MEQ tablet Take 1 tablet (20 mEq total) by mouth daily. 07/05/13   Eulas Post, MD  traMADol (ULTRAM) 50 MG tablet Take 50 mg by mouth every 8 (eight) hours as needed. For pain    Historical Provider, MD  warfarin (COUMADIN) 2.5 MG tablet Take as directed by anticoagulation clinic 11/10/13   Dorena Cookey, MD    Family History  Family History  Problem Relation Age of Onset  . Heart disease Mother   . Malignant hyperthermia Mother   . Hypertension Mother   . Heart disease Father   . Malignant hyperthermia Father   . Hyperlipidemia Father   . Hypertension Father   . Heart disease Sister   . Hypertension  Sister   . Heart disease Brother     Social History  Social History   Social History  . Marital Status: Married    Spouse Name: N/A  . Number of Children: N/A  . Years of Education: N/A   Occupational History  . Not on file.   Social History Main Topics  . Smoking status: Former Smoker -- 1.00 packs/day for 40 years    Types: Cigarettes    Quit date: 09/12/1989  . Smokeless tobacco: Former Systems developer    Quit date: 11/26/1990     Comment: quit around 1992  . Alcohol Use: No     Comment: former,  quit drinking in 1992  . Drug Use: No  . Sexual Activity: Not Currently   Other Topics Concern  . 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  Cardiovascular:  No chest pain, dyspnea on exertion, edema, orthopnea, palpitations, paroxysmal nocturnal dyspnea. Dermatological: No rash, lesions/masses Respiratory: No cough, dyspnea Urologic: No hematuria, dysuria Abdominal:   No nausea, vomiting, diarrhea, bright red blood per rectum, melena, or hematemesis Neurologic:  No visual changes, wkns, changes in mental status. All other systems reviewed and are otherwise negative except as noted above.  Physical Exam  Blood pressure 124/80, pulse 64, height 6' (1.829 m), weight 165 lb (74.844 kg).  General: Pleasant, NAD Psych: Normal affect. Neuro: Alert and oriented X 3. Moves all extremities spontaneously. HEENT: Normal  Neck: Supple without bruits or JVD. Lungs:  Resp regular and unlabored, CTA. Heart: RRR no s3, s4, or murmurs. Abdomen: Soft, non-tender, non-distended, BS + x 4.  Extremities: No clubbing, cyanosis, + 2 LE edema up to the knees DP/PT/Radials 2+ and equal bilaterally.  Labs:  No results for input(s): CKTOTAL,  CKMB, TROPONINI in the last 72 hours. Lab Results  Component Value Date   WBC 6.8 05/18/2015   HGB 10.5* 05/18/2015   HCT 31.6* 05/18/2015   MCV 90.4 05/18/2015   PLT 220.0 05/18/2015   No results for input(s): NA, K, CL, CO2, BUN, CREATININE, CALCIUM, PROT, BILITOT, ALKPHOS, ALT, AST, GLUCOSE in the last 168 hours.  Invalid input(s): LABALBU Lab Results  Component Value Date   CHOL 106 05/23/2015   HDL 34.40* 05/23/2015   LDLCALC 54 05/23/2015   TRIG 89.0 05/23/2015   No results found for: DDIMER Invalid input(s): POCBNP  Accessory Clinical Findings  Echocardiogram 01/04/2015  Left ventricle: The cavity size was mildly dilated. Systolic function was normal. The estimated ejection fraction was in the range of 50% to 55%. Wall motion was normal; there were no regional wall motion abnormalities. - Aortic valve: There was mild to moderate stenosis. Mean gradient (S): 21 mm Hg. Peak gradient (S): 34 mm Hg. Valve area (VTI): 1.16 cm^2. Valve area (Vmax): 1.22 cm^2. Valve area (Vmean): 1.18 cm^2. - Aortic root: The aortic root was normal in size. - Mitral valve: There was mild regurgitation. - Left atrium: The atrium was moderately dilated. - Right ventricle: The cavity size was mildly dilated. Wall thickness was normal. - Right atrium: The atrium was normal in size. - Tricuspid valve: There was trivial regurgitation. - Pulmonic valve: There was no regurgitation. - Pulmonary arteries: Systolic pressure was within the normal range. PA peak pressure: 40 mm Hg (S). - Inferior vena cava: The vessel was normal in size. - Pericardium, extracardiac: There was no pericardial effusion.  ECG - atrial fibrillation, rate controlled 64 BPM, nonspecific ST abnormality  Lexiscan nuclear stress test 05/31/2014 Impression  Exercise Capacity: Lexiscan with no exercise.  BP Response: Normal blood pressure response.  Clinical Symptoms: No significant symptoms noted.  ECG  Impression: There are scattered PVCs.  Comparison with Prior Nuclear Study: 2013 - mild apical thinning, non-gated  Overall Impression: Low risk stress nuclear study with small fixed inferoapical defect, which could represent thinning. No ischemia.  Assessment & Plan  1. CAD - stable, continue ASA, lipitor, metoprolol XL, negative stress test on 05/31/14, where chest pain controlled with sublingual nitroglycerin, stays active. EKG shows unchanged nonspecific changes in the lateral leads patient is asymptomatic and not further ischemic workup necessary at this point.  2. Chronic systolic CHF - LVEF [id number], on lasix 40 mg po daily, the patient has slightly improved chronic pitting LE edema, the patient used diet stockings at night as he can't put the compression stocking on. We stopped his potassium supplements in the past, we will check his electrolytes and creatinine today.   3. Carotid disease - His last sono carotid on 09/13/15 was unchanged from prior in 2015, with chronic right ICA occlusion and  40-59% stenosis in left ICA. We will recheck at the next visit.  4. Chronic persistent A-fib - rate controlled, INR therapeutic, no falls, we will continue Coumadin for now. We will check CBC today.  5. Lipids - all at goal , continue atorvastatin 10 mg po daily.  6. Aortic stenosis - mild to moderate on echo in 12/2014 - we will repeat now.  Follow-up in 6 months.   [name] Spark, MD, [facility] 01/07/2016, 1:26 PM

## 2016-01-07 NOTE — Patient Instructions (Addendum)
Your physician recommends that you continue on your current medications as directed. Please refer to the Current Medication list given to you today.  Your physician recommends that you return for lab work TODAY (CMET, CBC)  Your physician has requested that you have an echocardiogram. Echocardiography is a painless test that uses sound waves to create images of your heart. It provides your doctor with information about the size and shape of your heart and how well your heart's chambers and valves are working. This procedure takes approximately one hour. There are no restrictions for this procedure.  Your physician wants you to follow-up in: [place].   You will receive a reminder letter in the mail two months in advance. If you don't receive a letter, please call our office to schedule the follow-up appointment.

## 2016-01-10 ENCOUNTER — Encounter: Payer: Self-pay | Admitting: Podiatry

## 2016-01-10 ENCOUNTER — Ambulatory Visit (INDEPENDENT_AMBULATORY_CARE_PROVIDER_SITE_OTHER): Payer: Medicare Other | Admitting: Podiatry

## 2016-01-10 DIAGNOSIS — M79673 Pain in unspecified foot: Secondary | ICD-10-CM | POA: Diagnosis not present

## 2016-01-10 DIAGNOSIS — B351 Tinea unguium: Secondary | ICD-10-CM | POA: Diagnosis not present

## 2016-01-10 NOTE — Progress Notes (Signed)
Patient ID: [name] [name], male   DOB: 09/22/1925, 82 y.o.   MRN: [id number] HPI  Complaint:  Visit Type: Patient returns to my office for continued preventative foot care services. Complaint: Patient states" my nails have grown long and thick and become painful to walk and wear shoes" . He presents for preventative foot care services. No changes to ROS  Podiatric Exam: Vascular: dorsalis pedis and posterior tibial pulses are negative. Capillary return is immediate. Temperature gradient is negative. Skin turgor WNL,   Sensorium: Normal Semmes Weinstein monofilament test. Normal tactile sensation bilaterally.  Nail Exam: Pt has thick disfigured discolored nails with subungual debris noted bilateral entire nail hallux through fifth toenails Ulcer Exam: There is no evidence of ulcer or pre-ulcerative changes or infection. Orthopedic Exam: Muscle tone and strength are WNL. No limitations in general ROM. No crepitus or effusions noted. Foot type and digits show no abnormalities. Bony prominences are unremarkable. Amputation third toe right foot. Skin: No Porokeratosis. No infection or ulcers  Diagnosis:  Tinea unguium, Pain in right toe, pain in left toes  Treatment & Plan Procedures and Treatment: Consent by patient was obtained for treatment procedures. The patient understood the discussion of treatment and procedures well. All questions were answered thoroughly reviewed. Debridement of mycotic and hypertrophic toenails, 1 through 5 bilateral and clearing of subungual debris. No ulceration, no infection noted.  Return Visit-Office Procedure: Patient instructed to return to the office for a follow up visit 10 weeks for continued evaluation and treatment.  Gardiner Barefoot DPM

## 2016-01-17 ENCOUNTER — Ambulatory Visit: Payer: Medicare Other | Admitting: Podiatry

## 2016-01-18 ENCOUNTER — Other Ambulatory Visit: Payer: Self-pay

## 2016-01-18 ENCOUNTER — Other Ambulatory Visit: Payer: Self-pay | Admitting: Family Medicine

## 2016-01-18 ENCOUNTER — Ambulatory Visit (HOSPITAL_COMMUNITY): Payer: Medicare Other | Attending: Cardiology

## 2016-01-18 DIAGNOSIS — I5022 Chronic systolic (congestive) heart failure: Secondary | ICD-10-CM | POA: Diagnosis not present

## 2016-01-18 DIAGNOSIS — Z8249 Family history of ischemic heart disease and other diseases of the circulatory system: Secondary | ICD-10-CM | POA: Insufficient documentation

## 2016-01-18 DIAGNOSIS — I2510 Atherosclerotic heart disease of native coronary artery without angina pectoris: Secondary | ICD-10-CM | POA: Diagnosis not present

## 2016-01-18 DIAGNOSIS — I350 Nonrheumatic aortic (valve) stenosis: Secondary | ICD-10-CM

## 2016-01-18 DIAGNOSIS — I352 Nonrheumatic aortic (valve) stenosis with insufficiency: Secondary | ICD-10-CM | POA: Diagnosis not present

## 2016-01-18 DIAGNOSIS — I071 Rheumatic tricuspid insufficiency: Secondary | ICD-10-CM | POA: Insufficient documentation

## 2016-01-18 DIAGNOSIS — I482 Chronic atrial fibrillation, unspecified: Secondary | ICD-10-CM

## 2016-01-18 DIAGNOSIS — I77819 Aortic ectasia, unspecified site: Secondary | ICD-10-CM | POA: Insufficient documentation

## 2016-01-18 DIAGNOSIS — E785 Hyperlipidemia, unspecified: Secondary | ICD-10-CM | POA: Diagnosis not present

## 2016-01-18 DIAGNOSIS — I119 Hypertensive heart disease without heart failure: Secondary | ICD-10-CM | POA: Insufficient documentation

## 2016-01-23 ENCOUNTER — Ambulatory Visit (INDEPENDENT_AMBULATORY_CARE_PROVIDER_SITE_OTHER): Payer: Medicare Other | Admitting: General Practice

## 2016-01-23 DIAGNOSIS — I4891 Unspecified atrial fibrillation: Secondary | ICD-10-CM

## 2016-01-23 DIAGNOSIS — Z5181 Encounter for therapeutic drug level monitoring: Secondary | ICD-10-CM | POA: Diagnosis not present

## 2016-01-23 LAB — POCT INR: INR: 1.8

## 2016-01-23 NOTE — Progress Notes (Signed)
Pre visit review using our clinic review tool, if applicable. No additional management support is needed unless otherwise documented below in the visit note. 

## 2016-02-04 DIAGNOSIS — D509 Iron deficiency anemia, unspecified: Secondary | ICD-10-CM | POA: Diagnosis not present

## 2016-02-04 DIAGNOSIS — M79641 Pain in right hand: Secondary | ICD-10-CM | POA: Diagnosis not present

## 2016-02-04 DIAGNOSIS — M0570 Rheumatoid arthritis with rheumatoid factor of unspecified site without organ or systems involvement: Secondary | ICD-10-CM | POA: Diagnosis not present

## 2016-02-04 DIAGNOSIS — Z79899 Other long term (current) drug therapy: Secondary | ICD-10-CM | POA: Diagnosis not present

## 2016-02-04 DIAGNOSIS — M25562 Pain in left knee: Secondary | ICD-10-CM | POA: Diagnosis not present

## 2016-02-04 DIAGNOSIS — M79642 Pain in left hand: Secondary | ICD-10-CM | POA: Diagnosis not present

## 2016-02-20 ENCOUNTER — Ambulatory Visit (INDEPENDENT_AMBULATORY_CARE_PROVIDER_SITE_OTHER): Payer: Medicare Other | Admitting: General Practice

## 2016-02-20 DIAGNOSIS — I4891 Unspecified atrial fibrillation: Secondary | ICD-10-CM | POA: Diagnosis not present

## 2016-02-20 DIAGNOSIS — Z5181 Encounter for therapeutic drug level monitoring: Secondary | ICD-10-CM | POA: Diagnosis not present

## 2016-02-20 LAB — POCT INR
INR: 2.5
INR: 2.5

## 2016-02-20 NOTE — Progress Notes (Signed)
Pre visit review using our clinic review tool, if applicable. No additional management support is needed unless otherwise documented below in the visit note. 

## 2016-02-22 ENCOUNTER — Other Ambulatory Visit: Payer: Self-pay | Admitting: Cardiology

## 2016-03-07 DIAGNOSIS — Z79899 Other long term (current) drug therapy: Secondary | ICD-10-CM | POA: Diagnosis not present

## 2016-03-07 DIAGNOSIS — M0570 Rheumatoid arthritis with rheumatoid factor of unspecified site without organ or systems involvement: Secondary | ICD-10-CM | POA: Diagnosis not present

## 2016-03-19 ENCOUNTER — Ambulatory Visit (INDEPENDENT_AMBULATORY_CARE_PROVIDER_SITE_OTHER): Payer: Medicare Other | Admitting: General Practice

## 2016-03-19 DIAGNOSIS — I4891 Unspecified atrial fibrillation: Secondary | ICD-10-CM

## 2016-03-19 DIAGNOSIS — Z5181 Encounter for therapeutic drug level monitoring: Secondary | ICD-10-CM

## 2016-03-19 LAB — POCT INR: INR: 2.5

## 2016-03-19 NOTE — Progress Notes (Signed)
Pre visit review using our clinic review tool, if applicable. No additional management support is needed unless otherwise documented below in the visit note. 

## 2016-03-20 ENCOUNTER — Encounter: Payer: Self-pay | Admitting: Podiatry

## 2016-03-20 ENCOUNTER — Ambulatory Visit (INDEPENDENT_AMBULATORY_CARE_PROVIDER_SITE_OTHER): Payer: Medicare Other | Admitting: Podiatry

## 2016-03-20 DIAGNOSIS — B351 Tinea unguium: Secondary | ICD-10-CM

## 2016-03-20 DIAGNOSIS — Q828 Other specified congenital malformations of skin: Secondary | ICD-10-CM

## 2016-03-20 DIAGNOSIS — M79673 Pain in unspecified foot: Secondary | ICD-10-CM | POA: Diagnosis not present

## 2016-03-20 NOTE — Progress Notes (Signed)
Patient ID: [name] [name], male   DOB: 10/11/1925, 82 y.o.   MRN: [id number]  Complaint:  Visit Type: Patient returns to my office for continued preventative foot care services. Complaint: Patient states" my nails have grown long and thick and become painful to walk and wear shoes" . He presents for preventative foot care services. No changes to ROS  Podiatric Exam: Vascular: dorsalis pedis and posterior tibial pulses are negative. Capillary return is immediate. Temperature gradient is negative. Skin turgor WNL,   Sensorium: Normal Semmes Weinstein monofilament test. Normal tactile sensation bilaterally.  Nail Exam: Pt has thick disfigured discolored nails with subungual debris noted bilateral entire nail hallux through fifth toenails Ulcer Exam: There is no evidence of ulcer or pre-ulcerative changes or infection. Orthopedic Exam: Muscle tone and strength are WNL. No limitations in general ROM. No crepitus or effusions noted. Foot type and digits show no abnormalities. Bony prominences are unremarkable. Amputation third toe right foot. Skin: No Porokeratosis. No infection or ulcers.  Painful callus sub 1 B/L  Diagnosis:  Tinea unguium, Pain in right toe, pain in left toes, Porokeratosis  Treatment & Plan Procedures and Treatment: Consent by patient was obtained for treatment procedures. The patient understood the discussion of treatment and procedures well. All questions were answered thoroughly reviewed. Debridement of mycotic and hypertrophic toenails, 1 through 5 bilateral and clearing of subungual debris. No ulceration, no infection noted. Debride porokeratosis B/L Return Visit-Office Procedure: Patient instructed to return to the office for a follow up visit 10 weeks for continued evaluation and treatment.  Gardiner Barefoot DPM

## 2016-04-16 ENCOUNTER — Ambulatory Visit (INDEPENDENT_AMBULATORY_CARE_PROVIDER_SITE_OTHER): Payer: Medicare Other | Admitting: General Practice

## 2016-04-16 ENCOUNTER — Ambulatory Visit: Payer: Medicare Other

## 2016-04-16 DIAGNOSIS — I4891 Unspecified atrial fibrillation: Secondary | ICD-10-CM | POA: Diagnosis not present

## 2016-04-16 DIAGNOSIS — Z5181 Encounter for therapeutic drug level monitoring: Secondary | ICD-10-CM

## 2016-04-16 LAB — POCT INR: INR: 3

## 2016-04-16 NOTE — Progress Notes (Signed)
Pre visit review using our clinic review tool, if applicable. No additional management support is needed unless otherwise documented below in the visit note. 

## 2016-04-19 ENCOUNTER — Other Ambulatory Visit: Payer: Self-pay | Admitting: Cardiology

## 2016-05-14 ENCOUNTER — Ambulatory Visit (INDEPENDENT_AMBULATORY_CARE_PROVIDER_SITE_OTHER): Payer: Medicare Other | Admitting: General Practice

## 2016-05-14 DIAGNOSIS — I4891 Unspecified atrial fibrillation: Secondary | ICD-10-CM

## 2016-05-14 DIAGNOSIS — Z5181 Encounter for therapeutic drug level monitoring: Secondary | ICD-10-CM | POA: Diagnosis not present

## 2016-05-14 LAB — POCT INR: INR: 3

## 2016-05-14 NOTE — Progress Notes (Signed)
Pre visit review using our clinic review tool, if applicable. No additional management support is needed unless otherwise documented below in the visit note. 

## 2016-05-29 ENCOUNTER — Encounter: Payer: Self-pay | Admitting: Podiatry

## 2016-05-29 ENCOUNTER — Ambulatory Visit (INDEPENDENT_AMBULATORY_CARE_PROVIDER_SITE_OTHER): Payer: Medicare Other | Admitting: Podiatry

## 2016-05-29 DIAGNOSIS — B351 Tinea unguium: Secondary | ICD-10-CM | POA: Diagnosis not present

## 2016-05-29 DIAGNOSIS — Q828 Other specified congenital malformations of skin: Secondary | ICD-10-CM

## 2016-05-29 DIAGNOSIS — M79673 Pain in unspecified foot: Secondary | ICD-10-CM | POA: Diagnosis not present

## 2016-05-29 NOTE — Progress Notes (Signed)
Patient ID: [name] [name], male   DOB: 10/11/1925, 82 y.o.   MRN: [id number]  Complaint:  Visit Type: Patient returns to my office for continued preventative foot care services. Complaint: Patient states" my nails have grown long and thick and become painful to walk and wear shoes" . He presents for preventative foot care services. No changes to ROS  Podiatric Exam: Vascular: dorsalis pedis and posterior tibial pulses are negative. Capillary return is immediate. Temperature gradient is negative. Skin turgor WNL,   Sensorium: Normal Semmes Weinstein monofilament test. Normal tactile sensation bilaterally.  Nail Exam: Pt has thick disfigured discolored nails with subungual debris noted bilateral entire nail hallux through fifth toenails Ulcer Exam: There is no evidence of ulcer or pre-ulcerative changes or infection. Orthopedic Exam: Muscle tone and strength are WNL. No limitations in general ROM. No crepitus or effusions noted. Foot type and digits show no abnormalities. Bony prominences are unremarkable. Amputation third toe right foot. Skin: No Porokeratosis. No infection or ulcers.  Painful callus sub 1 B/L  Diagnosis:  Tinea unguium, Pain in right toe, pain in left toes, Porokeratosis  Treatment & Plan Procedures and Treatment: Consent by patient was obtained for treatment procedures. The patient understood the discussion of treatment and procedures well. All questions were answered thoroughly reviewed. Debridement of mycotic and hypertrophic toenails, 1 through 5 bilateral and clearing of subungual debris. No ulceration, no infection noted. Debride porokeratosis B/L Return Visit-Office Procedure: Patient instructed to return to the office for a follow up visit 10 weeks for continued evaluation and treatment.  Gardiner Barefoot DPM

## 2016-06-09 DIAGNOSIS — M25562 Pain in left knee: Secondary | ICD-10-CM | POA: Diagnosis not present

## 2016-06-09 DIAGNOSIS — M79642 Pain in left hand: Secondary | ICD-10-CM | POA: Diagnosis not present

## 2016-06-09 DIAGNOSIS — M25561 Pain in right knee: Secondary | ICD-10-CM | POA: Diagnosis not present

## 2016-06-09 DIAGNOSIS — M0609 Rheumatoid arthritis without rheumatoid factor, multiple sites: Secondary | ICD-10-CM | POA: Diagnosis not present

## 2016-06-09 DIAGNOSIS — M79641 Pain in right hand: Secondary | ICD-10-CM | POA: Diagnosis not present

## 2016-06-09 DIAGNOSIS — Z79899 Other long term (current) drug therapy: Secondary | ICD-10-CM | POA: Diagnosis not present

## 2016-06-11 ENCOUNTER — Ambulatory Visit: Payer: Medicare Other

## 2016-06-16 ENCOUNTER — Ambulatory Visit (INDEPENDENT_AMBULATORY_CARE_PROVIDER_SITE_OTHER): Payer: Medicare Other | Admitting: General Practice

## 2016-06-16 ENCOUNTER — Telehealth: Payer: Self-pay | Admitting: *Deleted

## 2016-06-16 DIAGNOSIS — I4891 Unspecified atrial fibrillation: Secondary | ICD-10-CM

## 2016-06-16 DIAGNOSIS — Z5181 Encounter for therapeutic drug level monitoring: Secondary | ICD-10-CM

## 2016-06-16 LAB — POCT INR: INR: 2.1

## 2016-06-17 ENCOUNTER — Other Ambulatory Visit: Payer: Self-pay | Admitting: *Deleted

## 2016-06-17 MED ORDER — METOPROLOL SUCCINATE ER 50 MG PO TB24: 50.0000 mg | ORAL_TABLET | Freq: Every day | ORAL | 3 refills | Status: DC

## 2016-06-17 NOTE — Progress Notes (Signed)
I agree with this plan.

## 2016-06-17 NOTE — Telephone Encounter (Signed)
Not sure if this pt should be taking a Metoprolol or not.  I do not see proper documentation of discontinuation of this medication by a MD.  Dr Meda Coffee will be out of the office for 2 weeks, so I advise that you call the pt or send a message to his Pharmacy that he should follow with his PCP in regards to this med, so he doesn't go without it, if truly taking.  I will also route this refill request to Dr Meda Coffee for further review and recommendation if this pt should be on this med, and follow-up accordingly.

## 2016-06-17 NOTE — Telephone Encounter (Signed)
called to inform pt to call his PCP for Metoprolol refill as we do not see this on his med list, Routed to Dr Meda Coffee & Karlene Einstein for follow up, pt said date on med bottle was 12/11/14, yet the medication is not on his list, will reroute to [facility] with this.

## 2016-06-21 ENCOUNTER — Other Ambulatory Visit: Payer: Self-pay | Admitting: Family Medicine

## 2016-06-26 ENCOUNTER — Other Ambulatory Visit: Payer: Self-pay | Admitting: General Practice

## 2016-06-26 MED ORDER — WARFARIN SODIUM 5 MG PO TABS: ORAL_TABLET | ORAL | 1 refills | Status: DC

## 2016-07-14 ENCOUNTER — Ambulatory Visit (INDEPENDENT_AMBULATORY_CARE_PROVIDER_SITE_OTHER): Payer: Medicare Other | Admitting: General Practice

## 2016-07-14 ENCOUNTER — Ambulatory Visit: Payer: Medicare Other | Admitting: Physician Assistant

## 2016-07-14 ENCOUNTER — Telehealth: Payer: Self-pay | Admitting: Family Medicine

## 2016-07-14 DIAGNOSIS — I4891 Unspecified atrial fibrillation: Secondary | ICD-10-CM | POA: Diagnosis not present

## 2016-07-14 DIAGNOSIS — Z5181 Encounter for therapeutic drug level monitoring: Secondary | ICD-10-CM | POA: Diagnosis not present

## 2016-07-14 LAB — POCT INR: INR: 3.6

## 2016-07-14 NOTE — Telephone Encounter (Signed)
Noted  

## 2016-07-14 NOTE — Telephone Encounter (Signed)
He would like to be called when the flu shots come in.

## 2016-07-15 ENCOUNTER — Ambulatory Visit: Payer: Medicare Other

## 2016-07-15 NOTE — Progress Notes (Signed)
I agree with this plan.

## 2016-07-17 ENCOUNTER — Encounter: Payer: Self-pay | Admitting: Physician Assistant

## 2016-07-17 ENCOUNTER — Ambulatory Visit (INDEPENDENT_AMBULATORY_CARE_PROVIDER_SITE_OTHER): Payer: Medicare Other | Admitting: Physician Assistant

## 2016-07-17 VITALS — BP 110/64 | HR 62 | Ht 72.0 in | Wt 164.0 lb

## 2016-07-17 DIAGNOSIS — E785 Hyperlipidemia, unspecified: Secondary | ICD-10-CM

## 2016-07-17 DIAGNOSIS — I2510 Atherosclerotic heart disease of native coronary artery without angina pectoris: Secondary | ICD-10-CM

## 2016-07-17 DIAGNOSIS — I350 Nonrheumatic aortic (valve) stenosis: Secondary | ICD-10-CM

## 2016-07-17 DIAGNOSIS — I482 Chronic atrial fibrillation, unspecified: Secondary | ICD-10-CM

## 2016-07-17 DIAGNOSIS — I739 Peripheral vascular disease, unspecified: Secondary | ICD-10-CM

## 2016-07-17 DIAGNOSIS — I5032 Chronic diastolic (congestive) heart failure: Secondary | ICD-10-CM

## 2016-07-17 DIAGNOSIS — I779 Disorder of arteries and arterioles, unspecified: Secondary | ICD-10-CM

## 2016-07-17 DIAGNOSIS — I10 Essential (primary) hypertension: Secondary | ICD-10-CM

## 2016-07-17 NOTE — Telephone Encounter (Signed)
Pt has been sch for Monday at 2 pm

## 2016-07-17 NOTE — Telephone Encounter (Signed)
Please Schedule pt.  Thanks

## 2016-07-17 NOTE — Patient Instructions (Signed)
Medication Instructions:  Your physician recommends that you continue on your current medications as directed. Please refer to the Current Medication list given to you today.   Labwork: -None  Testing/Procedures: -None  Follow-Up: Your physician wants you to follow-up in: 6 months with Dr. Nelson. You will receive a reminder letter in the mail two months in advance. If you don't receive a letter, please call our office to schedule the follow-up appointment.   Any Other Special Instructions Will Be Listed Below (If Applicable).     If you need a refill on your cardiac medications before your next appointment, please call your pharmacy.   

## 2016-07-17 NOTE — Progress Notes (Signed)
Cardiology Office Note    Date:  07/17/2016   ID:  ADHAV [name], DOB Sep 25, 1925, MRN [id number]  PCP:  [name], MD  Cardiologist: Dr. Meda Coffee  Chief Complaint  Patient presents with  . Follow-up    6 months/afib    History of Present Illness:  [name] [name] is a 82 y.o. male with a history of coronary artery disease with negative stress test 0000000, chronic systolic CHF LVEF [id number] but 60-65% on most recent echo in 12/2015, history of chronic A. fib, anticoagulation with Coumadin, HLD on atorvastatin  moderate AS on echo in 12/2015.Marland Kitchen He also has carotid artery disease 40-59% L ICA stenosis chronic right ICA occlusion on Dopplers 09/13/15 which is followed by the vascular surgery group. He denies any symptoms of TIAs.  Patient comes in today for regular check up. Still lives alone and drives. His wife has alzheimer's and is in a long term care unit.Rides exercise bike once a week. Denies chest pain, palpitations, dyspnea, dyspnea on exertion, dizziness, or presyncope.   Past Medical History:  Diagnosis Date  . Angina    "jaw & elbow was the worse"  . Arrhythmia   . Atrial fibrillation ([place]) 03/2011  . Bilateral cellulitis of lower leg   . Carotid artery occlusion    right total internal artery occlusion  . Coronary artery disease    s/p cardiac cath in 1990s, and cardiolite study  in 2005 showing EF 54%  . Degenerative disc disease    cervical spine  . GERD (gastroesophageal reflux disease)   . Hyperlipidemia   . Hypertension   . Myocardial infarction ([place]) 1992 and 1994  . Pneumonia ~ 1935  . Skin cancer    forehead  . TIA (transient ischemic attack)   . Urgency incontinence     Past Surgical History:  Procedure Laterality Date  . AMPUTATION Right 02/21/2015   Procedure: Right 3rd Ray Amputation;  Surgeon: Newt Minion, MD;  Location: [facility];  Service: Orthopedics;  Laterality: Right;  Digital block and ankle block with MAC  . CARDIAC CATHETERIZATION   1990s  . CATARACT EXTRACTION W/ INTRAOCULAR LENS  IMPLANT, BILATERAL    . SKIN CANCER EXCISION  12/2011   forehead    Current Medications: Outpatient Medications Prior to Visit  Medication Sig Dispense Refill  . aspirin 81 MG tablet Take 81 mg by mouth at bedtime.     Marland Kitchen atorvastatin (LIPITOR) 10 MG tablet Take 1 tablet (10 mg total) by mouth at bedtime. 90 tablet 2  . Calcium-Vitamin D (CALTRATE 600 PLUS-VIT D PO) Take 1 tablet by mouth at bedtime.     . digoxin (LANOXIN) 0.125 MG tablet TAKE 1 TABLET DAILY 90 tablet 0  . Fish Oil-Krill Oil (KRILL OIL PLUS PO) Take 1 tablet by mouth daily. Krill Oil-Omega 3- DHA-EPA 300-90 (123XX123) mg    . folic acid (FOLVITE) 1 MG tablet     . furosemide (LASIX) 40 MG tablet Take 1 tablet (40 mg total) by mouth daily. 90 tablet 3  . levothyroxine (SYNTHROID, LEVOTHROID) 100 MCG tablet TAKE 1 TABLET DAILY (OFFICE VISIT FOR MORE REFILLS) 30 tablet 0  . loperamide (IMODIUM) 2 MG capsule Take 1 capsule (2 mg total) by mouth as needed for diarrhea or loose stools (max 8 mg daily). 30 capsule 0  . methotrexate (RHEUMATREX) 2.5 MG tablet Take 7.5 mg by mouth 2 (two) times a week. Saturday and Sunday    . metoprolol succinate (TOPROL-XL)  50 MG 24 hr tablet Take 1 tablet (50 mg total) by mouth daily. Take with or immediately following a meal. 90 tablet 3  . Misc Natural Products (PROSTATE HEALTH PO) Take 2 tablets by mouth daily. 160-100-100    . Multiple Vitamin (MULTIVITAMIN WITH MINERALS) TABS tablet Take 1 tablet by mouth daily.    . Multiple Vitamins-Minerals (MEGA MULTI MEN PO) Take 1 tablet by mouth daily. 200-175-250 mcg    . Multiple Vitamins-Minerals (OCUVITE PO) Take 1 tablet by mouth daily.    . nitroGLYCERIN (NITROSTAT) 0.4 MG SL tablet Place 1 tablet (0.4 mg total) under the tongue every 5 (five) minutes as needed for chest pain. 30 tablet 0  . Pediatric Multivitamins-Fl (MULTIPLE VITAMINS/FLUORIDE) 1 MG CHEW Chew 1 tablet by mouth daily.    Marland Kitchen  spironolactone (ALDACTONE) 25 MG tablet Take 25 mg by mouth daily.    . traMADol (ULTRAM) 50 MG tablet Take 1 tablet (50 mg total) by mouth every 6 (six) hours as needed for moderate pain. Take 1-2 tablets by mouth daily as needed for arthritis 30 tablet 0  . warfarin (COUMADIN) 5 MG tablet TAKE AS DIRECTED BY ANTICOAGULATION CLINIC 120 tablet 1   No facility-administered medications prior to visit.      Allergies:   Levofloxacin and Klor-con [potassium chloride er]   Social History   Social History  . Marital status: Married    Spouse name: N/A  . Number of children: N/A  . Years of education: N/A   Social History Main Topics  . Smoking status: Former Smoker    Packs/day: 1.00    Years: 40.00    Types: Cigarettes    Quit date: 09/12/1989  . Smokeless tobacco: Former Systems developer    Quit date: 11/26/1990     Comment: quit around 1992  . Alcohol use No     Comment: former, quit drinking in 1992  . Drug use: No  . Sexual activity: Not Currently   Other Topics Concern  . 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   ROS:   Please see the history of present illness.    Review of Systems  Constitution: Negative.  HENT: Negative.   Cardiovascular: Positive for leg swelling.  Respiratory: Negative.   Endocrine: Negative.   Hematologic/Lymphatic: Negative.   Musculoskeletal: Negative.   Gastrointestinal: Positive for diarrhea.  Genitourinary: Negative.   Neurological: Negative.    All other systems reviewed and are negative.   PHYSICAL EXAM:   VS:  BP 110/64   Pulse 62   Ht 6' (1.829 m)   Wt 164  lb (74.4 kg)   BMI 22.24 kg/m   Physical Exam  GEN: Well nourished, well developed, in no acute distress  Neck: bilateral carotid bruits,no JVD,  or masses Cardiac:Irreg [id number] systolic murmur LSB and apex, no rubs, or gallops  Respiratory:  clear to auscultation bilaterally, normal work of breathing GI: soft, nontender, nondistended, + BS Ext: plus 2 edema up to knees,otherwise without cyanosis, clubbing,  Good distal pulses bilaterally MS: no deformity or atrophy  Skin: warm and  dry, no rash Neuro:  Alert and Oriented x 3, Strength and sensation are intact Psych: euthymic mood, full affect  Wt Readings from Last 3 Encounters:  07/17/16 164 lb (74.4 kg)  01/07/16 165 lb (74.8 kg)  09/19/15 168 lb (76.2 kg)      Studies/Labs Reviewed:   EKG:  EKG is  ordered today.  The ekg ordered today demonstrates Atrial fibrillation with nonspecific ST changes, no acute change.  Recent Labs: 01/07/2016: ALT 30; BUN 12; Creat 1.23; Hemoglobin 10.1; Platelets 220; Potassium 3.5; Sodium 140   Lipid Panel    Component Value Date/Time   CHOL 106 05/23/2015 0958   TRIG 89.0 05/23/2015 0958   TRIG 25 03/31/2010   HDL 34.40 (L) 05/23/2015 0958   CHOLHDL 3 05/23/2015 0958   VLDL 17.8 05/23/2015 0958   LDLCALC 54 05/23/2015 0958    Additional studies/ records that were reviewed today include:  2-D echo 01/18/16 Study Conclusions   - Left ventricle: The cavity size was normal. There was moderate   concentric hypertrophy. Systolic function was normal. The   estimated ejection fraction was in the range of 60% to 65%. Wall   motion was normal; there were no regional wall motion   abnormalities. Left ventricular diastolic function parameters   were normal. - Aortic valve: Trileaflet; severely thickened, severely calcified   leaflets. There was moderate stenosis. There was mild   regurgitation. Peak velocity (S): 328 cm/s. Mean gradient (S): 24   mm Hg. Valve area (VTI): 1.08 cm^2. Valve  area (Vmax): 1.2 cm^2.   Valve area (Vmean): 1.02 cm^2. - Aorta: Ascending aortic diameter: 38 mm (S). - Ascending aorta: The ascending aorta was mildly dilated. - Mitral valve: Mobility was not restricted. Transvalvular velocity   was within the normal range. There was no evidence for stenosis. - Left atrium: The atrium was severely dilated. - Right ventricle: The cavity size was normal. Wall thickness was   normal. Systolic function was normal. - Right atrium: The atrium was mildly dilated. - Tricuspid valve: There was mild regurgitation. - Inferior vena cava: The vessel was dilated. The respirophasic   diameter changes were in the normal range (>= 50%), consistent   with normal central venous pressure.   Impressions:   - Moderate aortic stenosis by valve gradients. Visually, the valve   is heavily calcified and thickened. It is possible that there is   severe aortic stenosis but the maximum gradient was not   identified.  Echocardiogram 01/04/2015   Left ventricle: The cavity size was mildly dilated. Systolic   function was normal. The estimated ejection fraction was in the   range of 50% to 55%. Wall motion was normal; there were no   regional wall motion abnormalities. - Aortic valve: There was mild to moderate stenosis. Mean gradient   (S): 21 mm Hg. Peak gradient (S): 34 mm Hg. Valve area (VTI):   1.16 cm^2. Valve area (Vmax): 1.22 cm^2. Valve area (Vmean): 1.18   cm^2. - Aortic root: The aortic root was normal in size. - Mitral valve: There was mild regurgitation. - Left atrium: The atrium was moderately dilated. - Right ventricle: The cavity size was mildly dilated. Wall   thickness was normal. - Right atrium: The atrium was normal in size. - Tricuspid valve: There was trivial regurgitation. - Pulmonic valve: There was no regurgitation. - Pulmonary arteries: Systolic pressure was within the normal   range. PA peak pressure: 40 mm Hg (S). - Inferior vena cava: The  vessel was normal in size. - Pericardium, extracardiac: There was no pericardial effusion.  ECG - atrial fibrillation, rate controlled 64 BPM, nonspecific ST abnormality   Lexiscan nuclear stress test 05/31/2014 Impression  Exercise Capacity: Lexiscan with no exercise.  BP Response: Normal blood pressure response.  Clinical Symptoms: No significant symptoms noted.  ECG Impression: There are scattered PVCs.  Comparison with Prior Nuclear Study: 2013 - mild apical thinning, non-gated  Overall Impression: Low risk stress nuclear study with small fixed inferoapical defect, which could represent thinning. No ischemia.     ASSESSMENT:    1. Chronic atrial fibrillation ([place])   2. Aortic stenosis   3. Essential hypertension   4. Bilateral carotid artery disease ([place])   5. Hyperlipidemia   6. Chronic diastolic (congestive) heart failure ([facility])      PLAN:  In order of problems listed above: Chronic atrial fibrillation rate controlled on the Toprol and digoxin. Coumadin managed by primary care.  Aortic stenosis moderate on echo in February 2017. Asymptomatic. Follow-up with Dr. Meda Coffee in February.  Central hypertension well controlled  Bilateral carotid artery disease due for carotid Dopplers in October will order  Hyperlipidemia on Lipitor  Chronic diastolic heart failure prior LV dysfunction but last echo EF 60-65%. Patient has chronic edema of the lower extremities. He has not been wearing his compression hose because they're difficult to get on and off. When he does wear them his edema is much better. His weight is similar to February and he has no other symptoms. Continue low-dose Lasix and Aldactone.     Medication Adjustments/Labs and Tests Ordered: Current medicines are reviewed at length with the patient today.  Concerns regarding medicines are outlined above.  Medication changes, Labs and Tests ordered today are listed in the Patient Instructions below. There are no Patient  Instructions on file for this visit.   Signed, Ermalinda Barrios, PA-C  07/17/2016 2:15 PM    [facility] [address], [place], [place]  [address] Phone: [phone]; Fax: [phone]

## 2016-07-20 ENCOUNTER — Other Ambulatory Visit: Payer: Self-pay | Admitting: Cardiology

## 2016-07-21 ENCOUNTER — Ambulatory Visit (INDEPENDENT_AMBULATORY_CARE_PROVIDER_SITE_OTHER): Payer: Medicare Other | Admitting: *Deleted

## 2016-07-21 ENCOUNTER — Other Ambulatory Visit: Payer: Self-pay | Admitting: Cardiology

## 2016-07-21 DIAGNOSIS — Z23 Encounter for immunization: Secondary | ICD-10-CM | POA: Diagnosis not present

## 2016-07-21 NOTE — Telephone Encounter (Signed)
Please advise on request as patients last lipid panel in epic was over one year ago. Thanks, MI

## 2016-07-22 ENCOUNTER — Other Ambulatory Visit: Payer: Self-pay

## 2016-07-22 NOTE — Telephone Encounter (Signed)
Please refill this pts atorvastatin accordingly, up to his recall date with Dr Meda Coffee in Jan 2018.  Thanks!

## 2016-08-07 ENCOUNTER — Encounter: Payer: Self-pay | Admitting: Podiatry

## 2016-08-07 ENCOUNTER — Ambulatory Visit (INDEPENDENT_AMBULATORY_CARE_PROVIDER_SITE_OTHER): Payer: Medicare Other | Admitting: Podiatry

## 2016-08-07 VITALS — Ht 72.0 in | Wt 164.0 lb

## 2016-08-07 DIAGNOSIS — B351 Tinea unguium: Secondary | ICD-10-CM | POA: Diagnosis not present

## 2016-08-07 DIAGNOSIS — M79673 Pain in unspecified foot: Secondary | ICD-10-CM

## 2016-08-07 DIAGNOSIS — Q828 Other specified congenital malformations of skin: Secondary | ICD-10-CM | POA: Diagnosis not present

## 2016-08-07 NOTE — Progress Notes (Signed)
Patient ID: [name] [name], male   DOB: 04/27/1925, 82 y.o.   MRN: [id number] HPI  Complaint:  Visit Type: Patient returns to my office for continued preventative foot care services. Complaint: Patient states" my nails have grown long and thick and become painful to walk and wear shoes" . He presents for preventative foot care services. No changes to ROS  Podiatric Exam: Vascular: dorsalis pedis and posterior tibial pulses are negative. Capillary return is immediate. Temperature gradient is negative. Skin turgor WNL,   Sensorium: Normal Semmes Weinstein monofilament test. Normal tactile sensation bilaterally.  Nail Exam: Pt has thick disfigured discolored nails with subungual debris noted bilateral entire nail hallux through fifth toenails Ulcer Exam: There is no evidence of ulcer or pre-ulcerative changes or infection. Orthopedic Exam: Muscle tone and strength are WNL. No limitations in general ROM. No crepitus or effusions noted. Foot type and digits show no abnormalities. Bony prominences are unremarkable. Amputation third toe right foot. Skin: No Porokeratosis. No infection or ulcers.  Painful callus sub 1 B/L  Diagnosis:  Tinea unguium, Pain in right toe, pain in left toes, Porokeratosis  Treatment & Plan Procedures and Treatment: Consent by patient was obtained for treatment procedures. The patient understood the discussion of treatment and procedures well. All questions were answered thoroughly reviewed. Debridement of mycotic and hypertrophic toenails, 1 through 5 bilateral and clearing of subungual debris. No ulceration, no infection noted. Debride porokeratosis B/L Return Visit-Office Procedure: Patient instructed to return to the office for a follow up visit 10 weeks for continued evaluation and treatment.  Keelyn Monjaras DPM  

## 2016-08-11 ENCOUNTER — Ambulatory Visit: Payer: Medicare Other | Admitting: General Practice

## 2016-08-11 DIAGNOSIS — Z5181 Encounter for therapeutic drug level monitoring: Secondary | ICD-10-CM

## 2016-08-11 DIAGNOSIS — I4891 Unspecified atrial fibrillation: Secondary | ICD-10-CM

## 2016-08-11 LAB — POCT INR: INR: 3

## 2016-09-08 ENCOUNTER — Ambulatory Visit (INDEPENDENT_AMBULATORY_CARE_PROVIDER_SITE_OTHER): Payer: Medicare Other | Admitting: General Practice

## 2016-09-08 DIAGNOSIS — Z5181 Encounter for therapeutic drug level monitoring: Secondary | ICD-10-CM

## 2016-09-08 DIAGNOSIS — I4891 Unspecified atrial fibrillation: Secondary | ICD-10-CM | POA: Diagnosis not present

## 2016-09-08 LAB — POCT INR: INR: 3

## 2016-09-08 NOTE — Patient Instructions (Signed)
Pre visit review using our clinic review tool, if applicable. No additional management support is needed unless otherwise documented below in the visit note. 

## 2016-09-12 DIAGNOSIS — M79642 Pain in left hand: Secondary | ICD-10-CM | POA: Diagnosis not present

## 2016-09-12 DIAGNOSIS — Z79899 Other long term (current) drug therapy: Secondary | ICD-10-CM | POA: Diagnosis not present

## 2016-09-12 DIAGNOSIS — M0609 Rheumatoid arthritis without rheumatoid factor, multiple sites: Secondary | ICD-10-CM | POA: Diagnosis not present

## 2016-09-12 DIAGNOSIS — M79641 Pain in right hand: Secondary | ICD-10-CM | POA: Diagnosis not present

## 2016-09-18 ENCOUNTER — Encounter: Payer: Self-pay | Admitting: Family

## 2016-09-23 ENCOUNTER — Ambulatory Visit (HOSPITAL_COMMUNITY)
Admission: RE | Admit: 2016-09-23 | Discharge: 2016-09-23 | Disposition: A | Discharge status: Home / Self Care | Payer: Medicare Other | Source: Ambulatory Visit | Attending: Family | Admitting: Family

## 2016-09-23 ENCOUNTER — Ambulatory Visit (INDEPENDENT_AMBULATORY_CARE_PROVIDER_SITE_OTHER): Payer: Medicare Other | Admitting: Family

## 2016-09-23 ENCOUNTER — Encounter: Payer: Self-pay | Admitting: Family

## 2016-09-23 VITALS — BP 138/69 | HR 56 | Temp 97.5°F | Resp 16 | Ht 72.0 in | Wt 164.5 lb

## 2016-09-23 DIAGNOSIS — I779 Disorder of arteries and arterioles, unspecified: Secondary | ICD-10-CM

## 2016-09-23 DIAGNOSIS — I6522 Occlusion and stenosis of left carotid artery: Secondary | ICD-10-CM | POA: Diagnosis not present

## 2016-09-23 DIAGNOSIS — I6521 Occlusion and stenosis of right carotid artery: Secondary | ICD-10-CM | POA: Diagnosis not present

## 2016-09-23 DIAGNOSIS — I739 Peripheral vascular disease, unspecified: Secondary | ICD-10-CM

## 2016-09-23 DIAGNOSIS — I872 Venous insufficiency (chronic) (peripheral): Secondary | ICD-10-CM | POA: Diagnosis not present

## 2016-09-23 LAB — VAS US CAROTID
LCCADDIAS: 11 cm/s
LCCADSYS: 77 cm/s
LCCAPDIAS: 18 cm/s
LEFT VERTEBRAL DIAS: 15 cm/s
LICAPDIAS: 31 cm/s
LICAPSYS: 152 cm/s
Left CCA prox sys: 70 cm/s
Left ICA dist dias: -24 cm/s
Left ICA dist sys: -89 cm/s
RIGHT CCA MID DIAS: 6 cm/s
RIGHT ECA DIAS: -9 cm/s
RIGHT VERTEBRAL DIAS: -12 cm/s
Right CCA prox sys: 45 cm/s

## 2016-09-23 NOTE — Patient Instructions (Addendum)
Stroke Prevention Some medical conditions and behaviors are associated with an increased chance of having a stroke. You may prevent a stroke by making healthy choices and managing medical conditions. HOW CAN I REDUCE MY RISK OF HAVING A STROKE?   Stay physically active. Get at least 30 minutes of activity on most or all days.  Do not smoke. It may also be helpful to avoid exposure to secondhand smoke.  Limit alcohol use. Moderate alcohol use is considered to be:  No more than 2 drinks per day for men.  No more than 1 drink per day for nonpregnant women.  Eat healthy foods. This involves:  Eating 5 or more servings of fruits and vegetables a day.  Making dietary changes that address high blood pressure (hypertension), high cholesterol, diabetes, or obesity.  Manage your cholesterol levels.  Making food choices that are high in fiber and low in saturated fat, trans fat, and cholesterol may control cholesterol levels.  Take any prescribed medicines to control cholesterol as directed by your health care provider.  Manage your diabetes.  Controlling your carbohydrate and sugar intake is recommended to manage diabetes.  Take any prescribed medicines to control diabetes as directed by your health care provider.  Control your hypertension.  Making food choices that are low in salt (sodium), saturated fat, trans fat, and cholesterol is recommended to manage hypertension.  Ask your health care provider if you need treatment to lower your blood pressure. Take any prescribed medicines to control hypertension as directed by your health care provider.  If you are 18-39 years of age, have your blood pressure checked every 3-5 years. If you are 40 years of age or older, have your blood pressure checked every year.  Maintain a healthy weight.  Reducing calorie intake and making food choices that are low in sodium, saturated fat, trans fat, and cholesterol are recommended to manage  weight.  Stop drug abuse.  Avoid taking birth control pills.  Talk to your health care provider about the risks of taking birth control pills if you are over 35 years old, smoke, get migraines, or have ever had a blood clot.  Get evaluated for sleep disorders (sleep apnea).  Talk to your health care provider about getting a sleep evaluation if you snore a lot or have excessive sleepiness.  Take medicines only as directed by your health care provider.  For some people, aspirin or blood thinners (anticoagulants) are helpful in reducing the risk of forming abnormal blood clots that can lead to stroke. If you have the irregular heart rhythm of atrial fibrillation, you should be on a blood thinner unless there is a good reason you cannot take them.  Understand all your medicine instructions.  Make sure that other conditions (such as anemia or atherosclerosis) are addressed. SEEK IMMEDIATE MEDICAL CARE IF:   You have sudden weakness or numbness of the face, arm, or leg, especially on one side of the body.  Your face or eyelid droops to one side.  You have sudden confusion.  You have trouble speaking (aphasia) or understanding.  You have sudden trouble seeing in one or both eyes.  You have sudden trouble walking.  You have dizziness.  You have a loss of balance or coordination.  You have a sudden, severe headache with no known cause.  You have new chest pain or an irregular heartbeat. Any of these symptoms may represent a serious problem that is an emergency. Do not wait to see if the symptoms will   go away. Get medical help at once. Call your local emergency services (911 in [place] Do not drive yourself to the hospital.   This information is not intended to replace advice given to you by your health care provider. Make sure you discuss any questions you have with your health care provider.   Document Released: 12/18/2004 Document Revised: 12/01/2014 Document Reviewed:  05/13/2013 Elsevier Interactive Patient Education 2016 Elsevier Inc[employer]     Venous Stasis or Chronic Venous Insufficiency Chronic venous insufficiency, also called venous stasis, is a condition that affects the veins in the legs. The condition prevents blood from being pumped through these veins effectively. Blood may no longer be pumped effectively from the legs back to the heart. This condition can range from mild to severe. With proper treatment, you should be able to continue with an active life. CAUSES  Chronic venous insufficiency occurs when the vein walls become stretched, weakened, or damaged or when valves within the vein are damaged. Some common causes of this include:  High blood pressure inside the veins (venous hypertension).  Increased blood pressure in the leg veins from long periods of sitting or standing.  A blood clot that blocks blood flow in a vein (deep vein thrombosis).  Inflammation of a superficial vein (phlebitis) that causes a blood clot to form. RISK FACTORS Various things can make you more likely to develop chronic venous insufficiency, including:  Family history of this condition.  Obesity.  Pregnancy.  Sedentary lifestyle.  Smoking.  Jobs requiring long periods of standing or sitting in one place.  Being a certain age. Women in their 40s and 50s and men in their 70s are more likely to develop this condition. SIGNS AND SYMPTOMS  Symptoms may include:   Varicose veins.  Skin breakdown or ulcers.  Reddened or discolored skin on the leg.  Brown, smooth, tight, and painful skin just above the ankle, usually on the inside surface (lipodermatosclerosis).  Swelling. DIAGNOSIS  To diagnose this condition, your health care provider will take a medical history and do a physical exam. The following tests may be ordered to confirm the diagnosis:  Duplex ultrasound--A procedure that produces a picture of a blood vessel and nearby organs and also  provides information on blood flow through the blood vessel.  Plethysmography--A procedure that tests blood flow.  A venogram, or venography--A procedure used to look at the veins using X-ray and dye. TREATMENT The goals of treatment are to help you return to an active life and to minimize pain or disability. Treatment will depend on the severity of the condition. Medical procedures may be needed for severe cases. Treatment options may include:   Use of compression stockings. These can help with symptoms and lower the chances of the problem getting worse, but they do not cure the problem.  Sclerotherapy--A procedure involving an injection of a material that "dissolves" the damaged veins. Other veins in the network of blood vessels take over the function of the damaged veins.  Surgery to remove the vein or cut off blood flow through the vein (vein stripping or laser ablation surgery).  Surgery to repair a valve. HOME CARE INSTRUCTIONS   Wear compression stockings as directed by your health care provider.  Only take over-the-counter or prescription medicines for pain, discomfort, or fever as directed by your health care provider.  Follow up with your health care provider as directed. SEEK MEDICAL CARE IF:   You have redness, swelling, or increasing pain in the affected area.    You see a red streak or line that extends up or down from the affected area.  You have a breakdown or loss of skin in the affected area, even if the breakdown is small.  You have an injury to the affected area. SEEK IMMEDIATE MEDICAL CARE IF:   You have an injury and open wound in the affected area.  Your pain is severe and does not improve with medicine.  You have sudden numbness or weakness in the foot or ankle below the affected area, or you have trouble moving your foot or ankle.  You have a fever or persistent symptoms for more than 2-3 days.  You have a fever and your symptoms suddenly get  worse. MAKE SURE YOU:   Understand these instructions.  Will watch your condition.  Will get help right away if you are not doing well or get worse.   This information is not intended to replace advice given to you by your health care provider. Make sure you discuss any questions you have with your health care provider.   Document Released: 03/16/2007 Document Revised: 08/31/2013 Document Reviewed: 07/18/2013 Elsevier Interactive Patient Education 2016 Elsevier Inc.  

## 2016-09-23 NOTE — Progress Notes (Signed)
Chief Complaint: Follow up Extracranial Carotid Artery Stenosis   History of Present Illness  [name] [name] is a 82 y.o. male patient of Dr. Kellie Simmering who has a known right ICA occlusion and mild to moderate left ICA stenosis. He returns today for routine surveillance.   He denies any neurologic symptoms such as lateralizing weakness, aphasia, amaurosis fugax, diplopia, blurred vision, and syncope.  He takes Coumadin on a daily basis for A. fib He denies claudication symptoms with walking, denies non healing wounds. Patient has not had previous carotid artery intervention.  On 02/21/15 he had a right 3rd Ray Amputation by Dr. Sharol Given for infected right 3rd toe; this was after a corn was removed. This has completely healed.  He reports being less active since his right foot surgery in March, denies any restrictions on his activity; states he will start using his stationary bike and hand weights.  Pt Diabetic: no Pt smoker: former smoker, quit in the 1990's  Pt meds include: Statin : yes ASA: yes Other anticoagulants/antiplatelets: warfarin for atrial fib   Past Medical History:  Diagnosis Date  . Angina    "jaw & elbow was the worse"  . Arrhythmia   . Atrial fibrillation ([place]) 03/2011  . Bilateral cellulitis of lower leg   . Carotid artery occlusion    right total internal artery occlusion  . Coronary artery disease    s/p cardiac cath in 1990s, and cardiolite study  in 2005 showing EF 54%  . Degenerative disc disease    cervical spine  . GERD (gastroesophageal reflux disease)   . Hyperlipidemia   . Hypertension   . Myocardial infarction 1992 and 1994  . Pneumonia ~ 1935  . Skin cancer    forehead  . TIA (transient ischemic attack)   . Urgency incontinence     Social History Social History  Substance Use Topics  . Smoking status: Former Smoker    Packs/day: 1.00    Years: 40.00    Types: Cigarettes    Quit date: 09/12/1989  . Smokeless tobacco: Former Systems developer     Quit date: 11/26/1990     Comment: quit around 1992  . Alcohol use No     Comment: former, quit drinking in 1992    Family History Family History  Problem Relation Age of Onset  . Heart disease Mother   . Malignant hyperthermia Mother   . Hypertension Mother   . Heart disease Father   . Malignant hyperthermia Father   . Hyperlipidemia Father   . Hypertension Father   . Heart disease Sister   . Hypertension Sister   . Heart disease Brother     Surgical History Past Surgical History:  Procedure Laterality Date  . AMPUTATION Right 02/21/2015   Procedure: Right 3rd Ray Amputation;  Surgeon: Newt Minion, MD;  Location: [facility];  Service: Orthopedics;  Laterality: Right;  Digital block and ankle block with MAC  . CARDIAC CATHETERIZATION  1990s  . CATARACT EXTRACTION W/ INTRAOCULAR LENS  IMPLANT, BILATERAL    . SKIN CANCER EXCISION  12/2011   forehead    Allergies  Allergen Reactions  . Levofloxacin Rash    "thought they were sticking swords thru me"  . Klor-Con [Potassium Chloride Er] Diarrhea    DIARRHEA    Current Outpatient Prescriptions  Medication Sig Dispense Refill  . aspirin 81 MG tablet Take 81 mg by mouth at bedtime.     Marland Kitchen atorvastatin (LIPITOR) 10 MG tablet Take 1  tablet (10 mg total) by mouth at bedtime. 90 tablet 1  . Calcium-Vitamin D (CALTRATE 600 PLUS-VIT D PO) Take 1 tablet by mouth at bedtime.     . digoxin (LANOXIN) 0.125 MG tablet Take 1 tablet (125 mcg total) by mouth daily. 90 tablet 3  . Fish Oil-Krill Oil (KRILL OIL PLUS PO) Take 1 tablet by mouth daily. Krill Oil-Omega 3- DHA-EPA 300-90 (123XX123) mg    . folic acid (FOLVITE) 1 MG tablet     . furosemide (LASIX) 40 MG tablet Take 1 tablet (40 mg total) by mouth daily. 90 tablet 3  . levothyroxine (SYNTHROID, LEVOTHROID) 100 MCG tablet TAKE 1 TABLET DAILY (OFFICE VISIT FOR MORE REFILLS) 30 tablet 0  . loperamide (IMODIUM) 2 MG capsule Take 1 capsule (2 mg total) by mouth as needed for diarrhea or  loose stools (max 8 mg daily). 30 capsule 0  . methotrexate (RHEUMATREX) 2.5 MG tablet Take 7.5 mg by mouth 2 (two) times a week. Saturday and Sunday    . Misc Natural Products (PROSTATE HEALTH PO) Take 2 tablets by mouth daily. 160-100-100    . Multiple Vitamin (MULTIVITAMIN WITH MINERALS) TABS tablet Take 1 tablet by mouth daily.    . Multiple Vitamins-Minerals (MEGA MULTI MEN PO) Take 1 tablet by mouth daily. 200-175-250 mcg    . Multiple Vitamins-Minerals (OCUVITE PO) Take 1 tablet by mouth daily.    . nitroGLYCERIN (NITROSTAT) 0.4 MG SL tablet Place 1 tablet (0.4 mg total) under the tongue every 5 (five) minutes as needed for chest pain. 30 tablet 0  . Pediatric Multivitamins-Fl (MULTIPLE VITAMINS/FLUORIDE) 1 MG CHEW Chew 1 tablet by mouth daily.    Marland Kitchen spironolactone (ALDACTONE) 25 MG tablet Take 25 mg by mouth daily.    . traMADol (ULTRAM) 50 MG tablet Take 1 tablet (50 mg total) by mouth every 6 (six) hours as needed for moderate pain. Take 1-2 tablets by mouth daily as needed for arthritis 30 tablet 0  . warfarin (COUMADIN) 5 MG tablet TAKE AS DIRECTED BY ANTICOAGULATION CLINIC 120 tablet 1  . metoprolol succinate (TOPROL-XL) 50 MG 24 hr tablet Take 1 tablet (50 mg total) by mouth daily. Take with or immediately following a meal. 90 tablet 3   No current facility-administered medications for this visit.     Review of Systems : See HPI for pertinent positives and negatives.  Physical Examination  Vitals:   09/23/16 1540 09/23/16 1541  BP: 120/65 138/69  Pulse: (!) 56   Resp: 16   Temp: 97.5 F (36.4 C)   TempSrc: Oral   SpO2: 96%   Weight: 164 lb 8 oz (74.6 kg)   Height: 6' (1.829 m)    Body mass index is 22.31 kg/m.  General: WDWN male in NAD GAIT: slow, deliberate, using quad cane Eyes: PERRLA Pulmonary: Respirations are non-labored, CTAB, no rales, no rhonchi, & no wheezing.  Cardiac: irregular rhythm, + murmur.  VASCULAR EXAM Carotid Bruits Right Left    Transmitted cardiac murmur Negative    Aorta is not palpable. Radial pulses are 2+ palpable and equal.  LE Pulses Right Left       POPLITEAL  not palpable  not palpable       POSTERIOR TIBIAL  not palpable  not palpable       DORSALIS PEDIS      ANTERIOR TIBIAL 1+ palpable faintly palpable    Gastrointestinal: soft, nontender, BS WNL, no r/g,  no palpable masses.  Musculoskeletal: no muscle atrophy/wasting. M/S 5/5 throughout, extremities without ischemic changes. Both feet and lower legs with venous stasis changes, dry flaking skin, no ulcers, 2+ bilateral pitting edema in both lower legs and feet. Right third toe surgically absent.   Neurologic: A&O X 3; Appropriate Affect, Speech is normal CN 2-12 intact, pain and light touch intact in extremities, Motor exam as listed above.     Assessment: [name] [name] is a 82 y.o. male who has no recent history of stroke or TIA. He had a TIA in the early 2000's as manifested by disorientation and diaphoresis, was in the Summer, loading vehicle at [employer]. He denies a history of MI.   Chronic venous insufficiency: He has knee hight compression hose but is unable to put them on. He will try to obtain a donning device from a medical supply store. I advised that he walk as much as he safely can, and when not walking to elevate his feet above the level of his heart to reduce the swelling in his lower legs.   DATA Today's carotid duplex suggests no color flow or spectral Doppler waveform is obtained involving the right internal carotid artery, known right internal carotid artery occlusion. Left internal carotid artery stenosis <40%. Bilateral ECA stenosis. Bilateral vertebral arteries are antegrade.  Decrease in disease on the left and unchanged on the right since study on  09/19/2015.   Plan: Follow-up in 1 year with Carotid Duplex scan.   I discussed in depth with the patient the nature of atherosclerosis, and emphasized the importance of maximal medical management including strict control of blood pressure, blood glucose, and lipid levels, obtaining regular exercise, and continued cessation of smoking.  The patient is aware that without maximal medical management the underlying atherosclerotic disease process will progress, limiting the benefit of any interventions. The patient was given information about stroke prevention and what symptoms should prompt the patient to seek immediate medical care. Thank you for allowing [place] to participate in this patient's care.  Clemon Chambers, RN, MSN, FNP-C [facility] Office: [phone]  Clinic Physician: Early  09/23/16 3:50 PM

## 2016-10-06 ENCOUNTER — Ambulatory Visit (INDEPENDENT_AMBULATORY_CARE_PROVIDER_SITE_OTHER): Payer: Medicare Other

## 2016-10-06 DIAGNOSIS — Z5181 Encounter for therapeutic drug level monitoring: Secondary | ICD-10-CM | POA: Diagnosis not present

## 2016-10-06 DIAGNOSIS — I4891 Unspecified atrial fibrillation: Secondary | ICD-10-CM | POA: Diagnosis not present

## 2016-10-06 LAB — POCT INR: INR: 2.4

## 2016-10-06 NOTE — Patient Instructions (Signed)
Pre visit review using our clinic review tool, if applicable. No additional management support is needed unless otherwise documented below in the visit note. 

## 2016-10-09 ENCOUNTER — Ambulatory Visit: Payer: Medicare Other | Admitting: Podiatry

## 2016-10-13 DIAGNOSIS — D509 Iron deficiency anemia, unspecified: Secondary | ICD-10-CM | POA: Diagnosis not present

## 2016-10-13 DIAGNOSIS — M0609 Rheumatoid arthritis without rheumatoid factor, multiple sites: Secondary | ICD-10-CM | POA: Diagnosis not present

## 2016-10-14 NOTE — Addendum Note (Signed)
Addended by: Lianne Cure A on: 10/14/2016 03:21 PM   Modules accepted: Orders

## 2016-10-15 ENCOUNTER — Ambulatory Visit (INDEPENDENT_AMBULATORY_CARE_PROVIDER_SITE_OTHER): Payer: Medicare Other | Admitting: Podiatry

## 2016-10-15 ENCOUNTER — Encounter: Payer: Self-pay | Admitting: Podiatry

## 2016-10-15 VITALS — Ht 72.0 in | Wt 164.0 lb

## 2016-10-15 DIAGNOSIS — Q828 Other specified congenital malformations of skin: Secondary | ICD-10-CM | POA: Diagnosis not present

## 2016-10-15 DIAGNOSIS — M79676 Pain in unspecified toe(s): Secondary | ICD-10-CM

## 2016-10-15 DIAGNOSIS — B351 Tinea unguium: Secondary | ICD-10-CM

## 2016-10-15 NOTE — Progress Notes (Signed)
Patient ID: [name] [name], male   DOB: 04/25/1925, 82 y.o.   MRN: [id number] HPI  Complaint:  Visit Type: Patient returns to my office for continued preventative foot care services. Complaint: Patient states" my nails have grown long and thick and become painful to walk and wear shoes" . He presents for preventative foot care services. No changes to ROS  Podiatric Exam: Vascular: dorsalis pedis and posterior tibial pulses are negative. Capillary return is immediate. Temperature gradient is negative. Skin turgor WNL,   Sensorium: Normal Semmes Weinstein monofilament test. Normal tactile sensation bilaterally.  Nail Exam: Pt has thick disfigured discolored nails with subungual debris noted bilateral entire nail hallux through fifth toenails Ulcer Exam: There is no evidence of ulcer or pre-ulcerative changes or infection. Orthopedic Exam: Muscle tone and strength are WNL. No limitations in general ROM. No crepitus or effusions noted. Foot type and digits show no abnormalities. Bony prominences are unremarkable. Amputation third toe right foot. Skin: No Porokeratosis. No infection or ulcers.  Painful callus sub 1 B/L  Diagnosis:  Tinea unguium, Pain in right toe, pain in left toes, Porokeratosis  Treatment & Plan Procedures and Treatment: Consent by patient was obtained for treatment procedures. The patient understood the discussion of treatment and procedures well. All questions were answered thoroughly reviewed. Debridement of mycotic and hypertrophic toenails, 1 through 5 bilateral and clearing of subungual debris. No ulceration, no infection noted. Debride porokeratosis B/L Return Visit-Office Procedure: Patient instructed to return to the office for a follow up visit 10 weeks for continued evaluation and treatment.  Jaycion Treml DPM  

## 2016-11-12 ENCOUNTER — Ambulatory Visit (INDEPENDENT_AMBULATORY_CARE_PROVIDER_SITE_OTHER): Payer: Medicare Other | Admitting: General Practice

## 2016-11-12 DIAGNOSIS — I4891 Unspecified atrial fibrillation: Secondary | ICD-10-CM

## 2016-11-12 DIAGNOSIS — Z5181 Encounter for therapeutic drug level monitoring: Secondary | ICD-10-CM

## 2016-11-12 LAB — POCT INR: INR: 1.3

## 2016-11-12 NOTE — Patient Instructions (Signed)
Pre visit review using our clinic review tool, if applicable. No additional management support is needed unless otherwise documented below in the visit note. 

## 2016-11-21 DIAGNOSIS — M0609 Rheumatoid arthritis without rheumatoid factor, multiple sites: Secondary | ICD-10-CM | POA: Diagnosis not present

## 2016-11-21 DIAGNOSIS — D509 Iron deficiency anemia, unspecified: Secondary | ICD-10-CM | POA: Diagnosis not present

## 2016-11-26 ENCOUNTER — Ambulatory Visit (INDEPENDENT_AMBULATORY_CARE_PROVIDER_SITE_OTHER): Payer: Medicare Other | Admitting: General Practice

## 2016-11-26 DIAGNOSIS — I4891 Unspecified atrial fibrillation: Secondary | ICD-10-CM

## 2016-11-26 DIAGNOSIS — Z5181 Encounter for therapeutic drug level monitoring: Secondary | ICD-10-CM

## 2016-11-26 LAB — POCT INR: INR: 2.9

## 2016-11-26 NOTE — Patient Instructions (Signed)
Pre visit review using our clinic review tool, if applicable. No additional management support is needed unless otherwise documented below in the visit note. 

## 2016-11-27 DIAGNOSIS — D649 Anemia, unspecified: Secondary | ICD-10-CM | POA: Diagnosis not present

## 2016-11-27 DIAGNOSIS — Z79899 Other long term (current) drug therapy: Secondary | ICD-10-CM | POA: Diagnosis not present

## 2016-11-27 DIAGNOSIS — M0570 Rheumatoid arthritis with rheumatoid factor of unspecified site without organ or systems involvement: Secondary | ICD-10-CM | POA: Diagnosis not present

## 2016-12-05 ENCOUNTER — Ambulatory Visit (HOSPITAL_BASED_OUTPATIENT_CLINIC_OR_DEPARTMENT_OTHER): Payer: Medicare Other | Admitting: Oncology

## 2016-12-05 ENCOUNTER — Encounter: Payer: Self-pay | Admitting: Oncology

## 2016-12-05 ENCOUNTER — Telehealth: Payer: Self-pay | Admitting: Oncology

## 2016-12-05 ENCOUNTER — Ambulatory Visit: Payer: Medicare Other

## 2016-12-05 VITALS — BP 128/80 | HR 58 | Temp 98.2°F | Resp 18 | Ht 72.0 in | Wt 172.1 lb

## 2016-12-05 DIAGNOSIS — D539 Nutritional anemia, unspecified: Secondary | ICD-10-CM

## 2016-12-05 DIAGNOSIS — D649 Anemia, unspecified: Secondary | ICD-10-CM

## 2016-12-05 DIAGNOSIS — I4891 Unspecified atrial fibrillation: Secondary | ICD-10-CM

## 2016-12-05 DIAGNOSIS — D638 Anemia in other chronic diseases classified elsewhere: Secondary | ICD-10-CM

## 2016-12-05 DIAGNOSIS — M069 Rheumatoid arthritis, unspecified: Secondary | ICD-10-CM | POA: Diagnosis not present

## 2016-12-05 DIAGNOSIS — Z7901 Long term (current) use of anticoagulants: Secondary | ICD-10-CM | POA: Diagnosis not present

## 2016-12-05 NOTE — Progress Notes (Signed)
[facility] Initial Visit:  Patient Care Team: Dorena Cookey, MD as PCP - General  CHIEF COMPLAINTS/PURPOSE OF CONSULTATION:  Anemia  HISTORY OF PRESENTING ILLNESS: [name] [name] 83 y.o. male who presents today with his daughter for evaluation of progressively worsening normocytic anemia. Most recent CBC from 11/21/16 demonstrated a hemoglobin 8.7 g/dL, hematocrit 28.6%, hematocrit 89.7, WBC 7.9K, platelet count 241K. He's had a progressive decline in his hemoglobin for the past year.  His baseline hemoglobin for the past year has been in the 10 g/dL range, however it has been slowly drifting down. Patient has a history of rheumatoid arthritis. He has been off methotrexate since late November 2017 and currently is only on prednisone. He states that overall he feels well. Patient is on warfarin for anticoagulation for atrial fibrillation. He denies any evidence of bleeding including melena, hematochezia, hemoptysis, hematuria. He states that his diet is fairly good. He denies any unexplained weight loss, night sweats, fatigue.  Review of Systems  Constitutional: Negative for appetite change, chills, fatigue and fever.  HENT:   Negative for hearing loss, lump/mass, mouth sores, sore throat and tinnitus.   Eyes: Negative for eye problems and icterus.  Respiratory: Negative for chest tightness, cough, hemoptysis, shortness of breath and wheezing.   Cardiovascular: Negative for chest pain, leg swelling and palpitations.  Gastrointestinal: Negative for abdominal distention, abdominal pain, blood in stool, diarrhea, nausea and vomiting.  Endocrine: Negative.  Negative for hot flashes.  Genitourinary: Negative for difficulty urinating, frequency and hematuria.   Musculoskeletal: Positive for arthralgias. Negative for neck pain.       Occasional arthralgias due to his RA.  Skin: Negative for itching and rash.  Neurological: Negative for dizziness, headaches and speech  difficulty.  Hematological: Negative for adenopathy. Does not bruise/bleed easily.  Psychiatric/Behavioral: Negative for confusion. The patient is not nervous/anxious.     MEDICAL HISTORY: Past Medical History:  Diagnosis Date  . Angina    "jaw & elbow was the worse"  . Arrhythmia   . Atrial fibrillation ([place]) 03/2011  . Bilateral cellulitis of lower leg   . Carotid artery occlusion    right total internal artery occlusion  . Coronary artery disease    s/p cardiac cath in 1990s, and cardiolite study  in 2005 showing EF 54%  . Degenerative disc disease    cervical spine  . GERD (gastroesophageal reflux disease)   . Hyperlipidemia   . Hypertension   . Myocardial infarction 1992 and 1994  . Pneumonia ~ 1935  . Skin cancer    forehead  . TIA (transient ischemic attack)   . Urgency incontinence     SURGICAL HISTORY: Past Surgical History:  Procedure Laterality Date  . AMPUTATION Right 02/21/2015   Procedure: Right 3rd Ray Amputation;  Surgeon: Newt Minion, MD;  Location: [facility];  Service: Orthopedics;  Laterality: Right;  Digital block and ankle block with MAC  . CARDIAC CATHETERIZATION  1990s  . CATARACT EXTRACTION W/ INTRAOCULAR LENS  IMPLANT, BILATERAL    . SKIN CANCER EXCISION  12/2011   forehead    SOCIAL HISTORY: Social History   Social History  . Marital status: Married    Spouse name: N/A  . Number of children: N/A  . Years of education: N/A   Occupational History  . Not on file.   Social History Main Topics  . Smoking status: Former Smoker    Packs/day: 1.00    Years: 40.00  Types: Cigarettes    Quit date: 09/12/1989  . Smokeless tobacco: Former Systems developer    Quit date: 11/26/1990     Comment: quit around 1992  . Alcohol use No     Comment: former, quit drinking in 1992  . Drug use: No  . Sexual activity: Not Currently   Other Topics Concern  . 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 Heart disease Mother   . Malignant hyperthermia Mother   . Hypertension Mother   . Heart disease Father   . Malignant hyperthermia Father   . Hyperlipidemia Father   . Hypertension Father   . Heart disease Sister   . Hypertension Sister   . Heart disease Brother     ALLERGIES:  is allergic to levofloxacin and klor-con [potassium chloride er].  MEDICATIONS:  Current Outpatient Prescriptions  Medication Sig Dispense Refill  . aspirin 81 MG tablet Take 81 mg by mouth at bedtime.     Marland Kitchen atorvastatin (LIPITOR) 10 MG tablet Take 1 tablet (10 mg total) by mouth at bedtime. 90 tablet 1  . Calcium-Vitamin D (CALTRATE 600 PLUS-VIT D PO) Take 1 tablet by mouth at bedtime.     . digoxin (LANOXIN) 0.125 MG tablet Take 1 tablet (125 mcg total) by mouth daily. 90 tablet 3  . Fish Oil-Krill Oil (KRILL OIL PLUS PO) Take 1 tablet by mouth daily. Krill Oil-Omega 3- DHA-EPA 300-90 (16-01) mg    . folic acid (FOLVITE) 1 MG tablet     . furosemide (LASIX) 40 MG tablet Take 1 tablet (40 mg total) by mouth daily. 90 tablet 3  . levothyroxine (SYNTHROID, LEVOTHROID) 100 MCG tablet TAKE 1 TABLET DAILY (OFFICE VISIT FOR MORE REFILLS) 30 tablet 0  . loperamide (IMODIUM) 2 MG capsule Take 1 capsule (2 mg total) by mouth as needed for diarrhea or loose stools (max 8 mg daily). 30 capsule 0  . metoprolol succinate (TOPROL-XL) 50 MG 24 hr tablet Take 1 tablet (50 mg total) by mouth daily. Take with or immediately following a meal. 90 tablet 3  . Misc Natural Products (PROSTATE HEALTH PO) Take 2 tablets by mouth daily. 160-100-100    . Multiple Vitamin (MULTIVITAMIN WITH MINERALS) TABS tablet Take 1 tablet by mouth daily.    . Multiple Vitamins-Minerals (MEGA MULTI MEN PO) Take 1 tablet by mouth daily. 200-175-250 mcg    . Multiple Vitamins-Minerals (OCUVITE PO)  Take 1 tablet by mouth daily.    . nitroGLYCERIN (NITROSTAT) 0.4 MG SL tablet Place 1 tablet (0.4 mg total) under the tongue every 5 (five) minutes as needed for chest pain. 30 tablet 0  . Pediatric Multivitamins-Fl (MULTIPLE VITAMINS/FLUORIDE) 1 MG CHEW Chew 1 tablet by mouth daily.    . predniSONE (DELTASONE) 10 MG tablet     . spironolactone (ALDACTONE) 25 MG tablet Take 25 mg by mouth daily.    . traMADol (ULTRAM) 50 MG tablet Take 1 tablet (50 mg total) by mouth every 6 (six) hours as needed for moderate pain. Take 1-2 tablets by mouth daily as needed for arthritis 30 tablet 0  . warfarin (COUMADIN) 5 MG tablet TAKE AS DIRECTED BY ANTICOAGULATION CLINIC 120 tablet 1   No current facility-administered medications for  this visit.     PHYSICAL EXAMINATION:     Vitals:   12/05/16 1300  BP: 128/80  Pulse: (!) 58  Resp: 18  Temp: 98.2 F (36.8 C)    Filed Weights   12/05/16 1300  Weight: 172 lb 1.6 oz (78.1 kg)     Physical Exam  Constitutional: He is oriented to person, place, and time and well-developed, well-nourished, and in no distress. No distress.  HENT:  Head: Normocephalic and atraumatic.  Mouth/Throat: No oropharyngeal exudate.  Eyes: Conjunctivae are normal. Pupils are equal, round, and reactive to light. No scleral icterus.  Neck: Normal range of motion. Neck supple. No JVD present.  Cardiovascular: Normal rate and normal heart sounds.  Exam reveals no gallop and no friction rub.   No murmur heard. Irregularly irregular rhythm  Pulmonary/Chest: Breath sounds normal. No respiratory distress. He has no wheezes. He has no rales.  Abdominal: Soft. Bowel sounds are normal. He exhibits no distension. There is no tenderness. There is no guarding.  Musculoskeletal: He exhibits edema. He exhibits no tenderness.  2+ leg edema b/l  Lymphadenopathy:    He has no cervical adenopathy.  Neurological: He is alert and oriented to person, place, and time. No cranial nerve  deficit.  Skin: Skin is warm and dry. No rash noted. No erythema. No pallor.  Psychiatric: Affect and judgment normal.     LABORATORY DATA: I have personally reviewed the data as listed:  Anti-coag visit on 11/26/2016  Component Date Value Ref Range Status  . INR 11/26/2016 2.9   Final  Anti-coag visit on 11/12/2016  Component Date Value Ref Range Status  . INR 11/12/2016 1.3   Final    RADIOGRAPHIC STUDIES: I have personally reviewed the radiological images as listed and agree with the findings in the report  No results found.  ASSESSMENT/PLAN 83 year old gentleman presents for evaluation of progressively worsening normocytic anemia. Anemia is likely multifactorial due to underlying chronic inflammatory disease with rheumatoid arthritis as well as possibly secondary to methotrexate.  PLAN: I will perform a full anemia workup labs as stated below.  Orders Placed This Encounter  Procedures  . CBC & Diff and Retic    Standing Status:   Future    Standing Expiration Date:   12/05/2017  . Erythropoietin    Standing Status:   Future    Standing Expiration Date:   12/05/2017  . Ferritin    Standing Status:   Future    Standing Expiration Date:   12/05/2017  . Iron and TIBC    Standing Status:   Future    Standing Expiration Date:   12/05/2017  . Folate, Serum    Standing Status:   Future    Standing Expiration Date:   12/05/2017  . Vitamin B12    Standing Status:   Future    Standing Expiration Date:   12/05/2017  . Haptoglobin    Standing Status:   Future    Standing Expiration Date:   12/05/2017  . Lactate dehydrogenase (LDH)    Standing Status:   Future    Standing Expiration Date:   12/05/2017  . Hemoglobinopathy evaluation    Standing Status:   Future    Standing Expiration Date:   12/05/2017  . Methylmalonic acid, serum    Standing Status:   Future    Standing Expiration Date:   12/05/2017  . PNH Profile (High Sensitivity)    Standing Status:   Future    Standing  Expiration  Date:   12/05/2017  . SPEP with reflex to IFE    Standing Status:   Future    Standing Expiration Date:   12/05/2017  . Direct antiglobulin test ([facility])    Standing Status:   Future    Standing Expiration Date:   12/05/2017   Return to clinic in 1 week to discuss lab results and to discuss the next plan of care. All questions were answered. The patient knows to call the clinic with any problems, questions or concerns.  This note was electronically signed.    Twana First, MD  12/05/2016 1:44 PM

## 2016-12-05 NOTE — Telephone Encounter (Signed)
Appointments scheduled per 11/2 LOS. Patient given AVS report and calendars with future scheduled appointments °

## 2016-12-08 ENCOUNTER — Other Ambulatory Visit (HOSPITAL_BASED_OUTPATIENT_CLINIC_OR_DEPARTMENT_OTHER): Payer: Medicare Other

## 2016-12-08 DIAGNOSIS — D649 Anemia, unspecified: Secondary | ICD-10-CM

## 2016-12-08 DIAGNOSIS — D638 Anemia in other chronic diseases classified elsewhere: Secondary | ICD-10-CM | POA: Diagnosis not present

## 2016-12-08 DIAGNOSIS — D539 Nutritional anemia, unspecified: Secondary | ICD-10-CM | POA: Diagnosis not present

## 2016-12-08 LAB — CBC & DIFF AND RETIC
BASO%: 0.4 % (ref 0.0–2.0)
Basophils Absolute: 0 10*3/uL (ref 0.0–0.1)
EOS%: 1.3 % (ref 0.0–7.0)
Eosinophils Absolute: 0.1 10*3/uL (ref 0.0–0.5)
HCT: 30.6 % — ABNORMAL LOW (ref 38.4–49.9)
HGB: 9.5 g/dL — ABNORMAL LOW (ref 13.0–17.1)
IMMATURE RETIC FRACT: 18.9 % — AB (ref 3.00–10.60)
LYMPH%: 15.5 % (ref 14.0–49.0)
MCH: 27.2 pg (ref 27.2–33.4)
MCHC: 31 g/dL — ABNORMAL LOW (ref 32.0–36.0)
MCV: 87.7 fL (ref 79.3–98.0)
MONO#: 1.2 10*3/uL — AB (ref 0.1–0.9)
MONO%: 13.2 % (ref 0.0–14.0)
NEUT%: 69.6 % (ref 39.0–75.0)
NEUTROS ABS: 6.4 10*3/uL (ref 1.5–6.5)
Platelets: 240 10*3/uL (ref 140–400)
RBC: 3.49 10*6/uL — AB (ref 4.20–5.82)
RDW: 18 % — AB (ref 11.0–14.6)
RETIC CT ABS: 54.1 10*3/uL (ref 34.80–93.90)
Retic %: 1.55 % (ref 0.80–1.80)
WBC: 9.2 10*3/uL (ref 4.0–10.3)
lymph#: 1.4 10*3/uL (ref 0.9–3.3)

## 2016-12-08 LAB — IRON AND TIBC
%SAT: 6 % — ABNORMAL LOW (ref 20–55)
Iron: 27 ug/dL — ABNORMAL LOW (ref 42–163)
TIBC: 413 ug/dL — ABNORMAL HIGH (ref 202–409)
UIBC: 386 ug/dL — AB (ref 117–376)

## 2016-12-08 LAB — LACTATE DEHYDROGENASE: LDH: 210 U/L (ref 125–245)

## 2016-12-08 LAB — FERRITIN: FERRITIN: 46 ng/mL (ref 22–316)

## 2016-12-09 LAB — DIRECT ANTIGLOBULIN TEST (NOT AT ARMC): Coombs', Direct: NEGATIVE

## 2016-12-09 LAB — HAPTOGLOBIN: HAPTOGLOBIN: 61 mg/dL (ref 34–200)

## 2016-12-09 LAB — ERYTHROPOIETIN: Erythropoietin: 42.6 m[IU]/mL — ABNORMAL HIGH (ref 2.6–18.5)

## 2016-12-09 LAB — FOLATE

## 2016-12-09 LAB — VITAMIN B12

## 2016-12-10 LAB — HEMOGLOBINOPATHY EVALUATION
HEMOGLOBIN A2 QUANTITATION: 2 % (ref 1.8–3.2)
HGB C: 0 %
HGB S: 0 %
HGB VARIANT: 0 %
Hemoglobin F Quantitation: 0 % (ref 0.0–2.0)
Hgb A: 98 % (ref 96.4–98.8)

## 2016-12-10 LAB — PROTEIN ELECTROPHORESIS, SERUM, WITH REFLEX
A/G Ratio: 1.5 (ref 0.7–1.7)
Albumin: 3.7 g/dL (ref 2.9–4.4)
Alpha 1: 0.2 g/dL (ref 0.0–0.4)
Alpha 2: 0.6 g/dL (ref 0.4–1.0)
Beta: 0.9 g/dL (ref 0.7–1.3)
Gamma Globulin: 0.7 g/dL (ref 0.4–1.8)
Globulin, Total: 2.4 g/dL (ref 2.2–3.9)
TOTAL PROTEIN: 6.1 g/dL (ref 6.0–8.5)

## 2016-12-11 LAB — METHYLMALONIC ACID, SERUM: Methylmalonic Acid, Serum: 310 nmol/L (ref 0–378)

## 2016-12-12 LAB — PNH PROFILE (-HIGH SENSITIVITY)

## 2016-12-16 ENCOUNTER — Ambulatory Visit (HOSPITAL_BASED_OUTPATIENT_CLINIC_OR_DEPARTMENT_OTHER): Payer: Medicare Other | Admitting: Oncology

## 2016-12-16 ENCOUNTER — Telehealth: Payer: Self-pay | Admitting: Oncology

## 2016-12-16 DIAGNOSIS — D509 Iron deficiency anemia, unspecified: Secondary | ICD-10-CM | POA: Insufficient documentation

## 2016-12-16 DIAGNOSIS — M069 Rheumatoid arthritis, unspecified: Secondary | ICD-10-CM | POA: Diagnosis not present

## 2016-12-16 DIAGNOSIS — D508 Other iron deficiency anemias: Secondary | ICD-10-CM

## 2016-12-16 NOTE — Telephone Encounter (Signed)
Appointments scheduled per 1/23 LOS. Patient given AVS report and calendars with future scheduled appointments. °

## 2016-12-16 NOTE — Progress Notes (Signed)
[facility] Follow up:    [name] Man, MD [place] [id number]   DIAGNOSIS:  Anemia  SUMMARY OF HEMATOLOGY HISTORY: [name] [name] 83 y.o. male who presents today with his daughter for evaluation of progressively worsening normocytic anemia. Most recent CBC from 11/21/16 demonstrated a hemoglobin 8.7 g/dL, hematocrit 28.6%, hematocrit 89.7, WBC 7.9K, platelet count 241K. He's had a progressive decline in his hemoglobin for the past year.  His baseline hemoglobin for the past year has been in the 10 g/dL range, however it has been slowly drifting down. Patient has a history of rheumatoid arthritis. He has been off methotrexate since late November 2017 and currently is only on prednisone. He states that overall he feels well. Patient is on warfarin for anticoagulation for atrial fibrillation. He denies any evidence of bleeding including melena, hematochezia, hemoptysis, hematuria. He states that his diet is fairly good, however he does not eat meat due to lack of teeth. He denies any unexplained weight loss, night sweats, fatigue  INTERVAL HISTORY: [name] [name] 83 y.o. male returns for evaluation of anemia and review lab work performed on 12/08/16. Hemoglobin electrophoresis demonstrated normal adult hemoglobin present no evidence of hemoglobinopathy. SPEP/IFE was negative for monoclonal paraprotein, erythropoietin elevated at 42.6, immature reticulocyte fraction 18.9. Haptoglobin and LDH were within normal limits. Coombs test negative. PNH profile was negative. Folate, vitamin B12, methylmalonic acid were all within normal limits. Iron studies demonstrated a low serum iron 27, TIBC 413, iron percent saturation 6%, ferritin 46. CBC demonstrated WBC 9.2K, hemoglobin 9.5 g/dL, hematocrit 30.6, MCV 87.7, platelet count 240K. He has been having diarrhea 2-3 times a day recently but otherwise has no new complaints.   Patient Active Problem List    Diagnosis Date Noted  . Iron deficiency anemia 12/16/2016  . Aortic stenosis 04/10/2015  . Protein-calorie malnutrition, severe ([place]) 02/23/2015  . Osteomyelitis ([place]) 02/17/2015  . Chronic anticoagulation 02/17/2015  . Bilateral carotid artery disease ([place]) 09/12/2014  . Chronic systolic CHF (congestive heart failure), NYHA class 2 ([place]) 07/10/2014  . Encounter for therapeutic drug monitoring 01/02/2014  . Hypothyroidism 07/04/2013  . Asthma exacerbation, allergic 09/13/2012  . Occlusion and stenosis of carotid artery without mention of cerebral infarction 05/18/2012  . Adrenal insufficiency ([place]) 02/08/2012  . Thrombocytopenia ([place]) 02/07/2012  . 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 Anemia 11/26/2011  . Long term (current) use of anticoagulants 02/24/2011  . Atrial fibrillation ([place]) 01/12/2009  . ALLERGIC RHINITIS 02/24/2008  . HLD (hyperlipidemia) 06/15/2007  . Benign essential HTN 06/15/2007  . MYOCARDIAL INFARCTION, HX OF 06/15/2007  . Coronary atherosclerosis 06/15/2007  . TRANSIENT ISCHEMIC ATTACK, HX OF 06/15/2007    is allergic to levofloxacin and klor-con [potassium chloride er].  MEDICAL HISTORY: Past Medical History:  Diagnosis Date  . Angina    "jaw & elbow was the worse"  . Arrhythmia   . Atrial fibrillation ([place]) 03/2011  . Bilateral cellulitis of lower leg   . Carotid artery occlusion    right total internal artery occlusion  . Coronary artery disease    s/p cardiac cath in 1990s, and cardiolite study  in 2005 showing EF 54%  . Degenerative disc disease    cervical spine  . GERD (gastroesophageal reflux disease)   . Hyperlipidemia   . Hypertension   . Myocardial infarction 1992 and 1994  . Pneumonia ~ 1935  . Skin cancer    forehead  . TIA (transient ischemic attack)   .  Urgency incontinence     SURGICAL HISTORY: Past Surgical History:  Procedure Laterality Date  . AMPUTATION Right 02/21/2015   Procedure: Right 3rd Ray Amputation;  Surgeon:  Newt Minion, MD;  Location: [facility];  Service: Orthopedics;  Laterality: Right;  Digital block and ankle block with MAC  . CARDIAC CATHETERIZATION  1990s  . CATARACT EXTRACTION W/ INTRAOCULAR LENS  IMPLANT, BILATERAL    . SKIN CANCER EXCISION  12/2011   forehead    SOCIAL HISTORY: Social History   Social History  . Marital status: Married    Spouse name: N/A  . Number of children: N/A  . Years of education: N/A   Occupational History  . Not on file.   Social History Main Topics  . Smoking status: Former Smoker    Packs/day: 1.00    Years: 40.00    Types: Cigarettes    Quit date: 09/12/1989  . Smokeless tobacco: Former Systems developer    Quit date: 11/26/1990     Comment: quit around 1992  . Alcohol use No     Comment: former, quit drinking in 1992  . Drug use: No  . Sexual activity: Not Currently   Other Topics Concern  . 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 Heart disease Mother   . Malignant hyperthermia Mother   . Hypertension Mother   . Heart disease Father   . Malignant hyperthermia Father   . Hyperlipidemia Father   . Hypertension Father   . Heart disease Sister   . Hypertension Sister   . Heart disease Brother     Review of Systems  Constitutional: Negative for appetite change, chills, fatigue and fever.  HENT:   Negative for hearing loss, lump/mass, mouth sores, sore throat and tinnitus.   Eyes: Negative for eye problems and icterus.  Respiratory: Negative for chest tightness, cough, hemoptysis, shortness of breath and wheezing.   Cardiovascular: Negative for chest pain, leg swelling and palpitations.  Gastrointestinal: Positive for diarrhea. Negative for abdominal distention, abdominal pain, blood in stool, nausea and vomiting.  Endocrine:  Negative.  Negative for hot flashes.  Genitourinary: Negative for difficulty urinating, frequency and hematuria.   Musculoskeletal: Negative for arthralgias and neck pain.  Skin: Negative for itching and rash.  Neurological: Negative for dizziness, headaches and speech difficulty.  Hematological: Negative for adenopathy. Does not bruise/bleed easily.  Psychiatric/Behavioral: Negative for confusion. The patient is not nervous/anxious.       PHYSICAL EXAMINATION    Vitals:   12/16/16 1352  BP: 113/65  Pulse: 73  Resp: 18  Temp: 98.5 F (36.9 C)    Physical Exam  Constitutional: He is oriented to person, place, and time and well-developed, well-nourished, and in no distress. No distress.  HENT:  Head: Normocephalic and atraumatic.  Mouth/Throat: No oropharyngeal exudate.  Eyes: Conjunctivae are normal. Pupils are equal, round, and reactive to light. No scleral icterus.  Neck: Normal range of motion. Neck supple. No JVD present.  Cardiovascular: Normal rate, regular rhythm and normal heart sounds.  Exam reveals no gallop and no friction rub.   No murmur heard. Pulmonary/Chest: Breath sounds normal. No respiratory distress. He has no wheezes. He has no rales.  Abdominal: Soft. Bowel sounds are  normal. He exhibits no distension. There is no tenderness. There is no guarding.  Musculoskeletal: He exhibits no edema or tenderness.  Lymphadenopathy:    He has no cervical adenopathy.  Neurological: He is alert and oriented to person, place, and time. No cranial nerve deficit.  Skin: Skin is warm and dry. No rash noted. No erythema. No pallor.  Psychiatric: Affect and judgment normal.    LABORATORY DATA:  CBC    Component Value Date/Time   WBC 9.2 12/08/2016 1350   WBC 7.5 01/07/2016 1402   RBC 3.49 (L) 12/08/2016 1350   RBC 3.42 (L) 01/07/2016 1402   HGB 9.5 (L) 12/08/2016 1350   HCT 30.6 (L) 12/08/2016 1350   PLT 240 12/08/2016 1350   MCV 87.7 12/08/2016 1350   MCH 27.2  12/08/2016 1350   MCH 29.5 01/07/2016 1402   MCHC 31.0 (L) 12/08/2016 1350   MCHC 32.5 01/07/2016 1402   RDW 18.0 (H) 12/08/2016 1350   LYMPHSABS 1.4 12/08/2016 1350   MONOABS 1.2 (H) 12/08/2016 1350   EOSABS 0.1 12/08/2016 1350   BASOSABS 0.0 12/08/2016 1350    CMP     Component Value Date/Time   NA 140 01/07/2016 1402   K 3.5 01/07/2016 1402   CL 98 01/07/2016 1402   CO2 33 (H) 01/07/2016 1402   GLUCOSE 68 01/07/2016 1402   BUN 12 01/07/2016 1402   CREATININE 1.23 (H) 01/07/2016 1402   CALCIUM 9.1 01/07/2016 1402   PROT 6.1 12/08/2016 1350   ALBUMIN 3.7 01/07/2016 1402   AST 42 (H) 01/07/2016 1402   ALT 30 01/07/2016 1402   ALKPHOS 58 01/07/2016 1402   BILITOT 0.4 01/07/2016 1402   GFRNONAA 62 (L) 02/21/2015 0445   GFRAA 72 (L) 02/21/2015 0445       PENDING LABS:   RADIOGRAPHIC STUDIES:  No results found.   PATHOLOGY:     ASSESSMENT: 83 year old gentleman presents for evaluation of progressively worsening normocytic anemia. Anemia is likely multifactorial due to underlying chronic inflammatory disease with rheumatoid arthritis as well as possibly secondary to methotrexate. Anemia workup revealed the presence of iron deficiency which is likely due to a nutritional deficiency since he does not eat a lot of meat.  PLAN: I have reviewed patient's anemia workup in detail with him and his daughter today. Patient's anemia had improved on its own somewhat going from 8.7 g/dL in December to 9.5 g/dL on the last blood draw.  I have discussed IV iron replacement with feraheme in detail with the patient and his daughter today including dosage, administration, side effects. They're willing to proceed, therefore I'll set patient for IV feraheme. I do not believe patient is a good candidate for oral iron since he does have a lot of GI issues with constipation/diarrhea already. Return to clinic in 6 weeks for follow-up with repeat CBC, iron studies prior to his next  visit.  All questions were answered. The patient knows to call the clinic with any problems, questions or concerns. We can certainly see the patient much sooner if necessary. This note was electronically signed. Twana First, MD 12/16/2016

## 2016-12-22 ENCOUNTER — Ambulatory Visit (INDEPENDENT_AMBULATORY_CARE_PROVIDER_SITE_OTHER): Payer: Medicare Other | Admitting: General Practice

## 2016-12-22 DIAGNOSIS — Z5181 Encounter for therapeutic drug level monitoring: Secondary | ICD-10-CM

## 2016-12-22 DIAGNOSIS — I4891 Unspecified atrial fibrillation: Secondary | ICD-10-CM

## 2016-12-22 LAB — POCT INR: INR: 2.8

## 2016-12-22 NOTE — Patient Instructions (Signed)
Pre visit review using our clinic review tool, if applicable. No additional management support is needed unless otherwise documented below in the visit note. 

## 2016-12-23 ENCOUNTER — Other Ambulatory Visit: Payer: Self-pay | Admitting: Family Medicine

## 2016-12-24 ENCOUNTER — Ambulatory Visit: Payer: Medicare Other | Admitting: Podiatry

## 2016-12-24 ENCOUNTER — Encounter: Payer: Self-pay | Admitting: Podiatry

## 2016-12-24 ENCOUNTER — Ambulatory Visit (INDEPENDENT_AMBULATORY_CARE_PROVIDER_SITE_OTHER): Payer: Medicare Other | Admitting: Podiatry

## 2016-12-24 VITALS — Ht 72.0 in | Wt 172.0 lb

## 2016-12-24 DIAGNOSIS — M79676 Pain in unspecified toe(s): Secondary | ICD-10-CM

## 2016-12-24 DIAGNOSIS — B351 Tinea unguium: Secondary | ICD-10-CM

## 2016-12-24 DIAGNOSIS — Q828 Other specified congenital malformations of skin: Secondary | ICD-10-CM | POA: Diagnosis not present

## 2016-12-24 NOTE — Progress Notes (Signed)
Patient ID: [name] [name], male   DOB: 01/27/1925, 83 y.o.   MRN: [id number] HPI  Complaint:  Visit Type: Patient returns to my office for continued preventative foot care services. Complaint: Patient states" my nails have grown long and thick and become painful to walk and wear shoes" . He presents for preventative foot care services. No changes to ROS  Podiatric Exam: Vascular: dorsalis pedis and posterior tibial pulses are negative. Capillary return is immediate. Temperature gradient is negative. Skin turgor WNL,   Sensorium: Normal Semmes Weinstein monofilament test. Normal tactile sensation bilaterally.  Nail Exam: Pt has thick disfigured discolored nails with subungual debris noted bilateral entire nail hallux through fifth toenails Ulcer Exam: There is no evidence of ulcer or pre-ulcerative changes or infection. Orthopedic Exam: Muscle tone and strength are WNL. No limitations in general ROM. No crepitus or effusions noted. Foot type and digits show no abnormalities. Bony prominences are unremarkable. Amputation third toe right foot. Skin: No Porokeratosis. No infection or ulcers.  Painful callus sub 1 B/L  Diagnosis:  Tinea unguium, Pain in right toe, pain in left toes, Porokeratosis  Treatment & Plan Procedures and Treatment: Consent by patient was obtained for treatment procedures. The patient understood the discussion of treatment and procedures well. All questions were answered thoroughly reviewed. Debridement of mycotic and hypertrophic toenails, 1 through 5 bilateral and clearing of subungual debris. No ulceration, no infection noted. Debride porokeratosis B/L Return Visit-Office Procedure: Patient instructed to return to the office for a follow up visit 10 weeks for continued evaluation and treatment.  Magdiel Bartles DPM  

## 2016-12-26 ENCOUNTER — Telehealth: Payer: Self-pay | Admitting: *Deleted

## 2016-12-26 NOTE — Telephone Encounter (Signed)
"  This is Erline Hau patient's daughter.  No one has called me or told me anything.  I live in [place].  I saw the doctor's nurse enter special instructions "  Provided appointment information  Noted under Specialty comments "Please call daighter. Erline Hau with all appointments. Cell is [phone] work is [phone] (per patient 12/16/16)"  Added this to patient demographics.

## 2016-12-30 ENCOUNTER — Ambulatory Visit (HOSPITAL_BASED_OUTPATIENT_CLINIC_OR_DEPARTMENT_OTHER): Payer: Medicare Other

## 2016-12-30 VITALS — BP 123/55 | HR 58 | Temp 98.0°F | Resp 20

## 2016-12-30 DIAGNOSIS — D508 Other iron deficiency anemias: Secondary | ICD-10-CM

## 2016-12-30 DIAGNOSIS — D509 Iron deficiency anemia, unspecified: Secondary | ICD-10-CM

## 2016-12-30 MED ORDER — SODIUM CHLORIDE 0.9 % IV SOLN
Freq: Once | INTRAVENOUS | Status: AC
  Administered 2016-12-30: 10:00:00 via INTRAVENOUS

## 2016-12-30 MED ORDER — SODIUM CHLORIDE 0.9 % IV SOLN
510.0000 mg | Freq: Once | INTRAVENOUS | Status: AC
  Administered 2016-12-30: 510 mg via INTRAVENOUS
  Filled 2016-12-30: qty 17

## 2016-12-30 NOTE — Patient Instructions (Signed)
Ferumoxytol injection What is this medicine? FERUMOXYTOL is an iron complex. Iron is used to make healthy red blood cells, which carry oxygen and nutrients throughout the body. This medicine is used to treat iron deficiency anemia in people with chronic kidney disease. COMMON BRAND NAME(S): Feraheme What should I tell my health care provider before I take this medicine? They need to know if you have any of these conditions: -anemia not caused by low iron levels -high levels of iron in the blood -magnetic resonance imaging (MRI) test scheduled -an unusual or allergic reaction to iron, other medicines, foods, dyes, or preservatives -pregnant or trying to get pregnant -breast-feeding How should I use this medicine? This medicine is for injection into a vein. It is given by a health care professional in a hospital or clinic setting. Talk to your pediatrician regarding the use of this medicine in children. Special care may be needed. What if I miss a dose? It is important not to miss your dose. Call your doctor or health care professional if you are unable to keep an appointment. What may interact with this medicine? This medicine may interact with the following medications: -other iron products What should I watch for while using this medicine? Visit your doctor or healthcare professional regularly. Tell your doctor or healthcare professional if your symptoms do not start to get better or if they get worse. You may need blood work done while you are taking this medicine. You may need to follow a special diet. Talk to your doctor. Foods that contain iron include: whole grains/cereals, dried fruits, beans, or peas, leafy green vegetables, and organ meats (liver, kidney). What side effects may I notice from receiving this medicine? Side effects that you should report to your doctor or health care professional as soon as possible: -allergic reactions like skin rash, itching or hives, swelling of the  face, lips, or tongue -breathing problems -changes in blood pressure -feeling faint or lightheaded, falls -fever or chills -flushing, sweating, or hot feelings -swelling of the ankles or feet Side effects that usually do not require medical attention (report to your doctor or health care professional if they continue or are bothersome): -diarrhea -headache -nausea, vomiting -stomach pain Where should I keep my medicine? This drug is given in a hospital or clinic and will not be stored at home.  2017 Elsevier/Gold Standard (2015-12-13 12:41:49)  

## 2017-01-06 ENCOUNTER — Ambulatory Visit (HOSPITAL_BASED_OUTPATIENT_CLINIC_OR_DEPARTMENT_OTHER): Payer: Medicare Other

## 2017-01-06 VITALS — BP 122/58 | HR 55 | Temp 98.7°F | Resp 18

## 2017-01-06 DIAGNOSIS — M069 Rheumatoid arthritis, unspecified: Secondary | ICD-10-CM

## 2017-01-06 DIAGNOSIS — D508 Other iron deficiency anemias: Secondary | ICD-10-CM

## 2017-01-06 DIAGNOSIS — D509 Iron deficiency anemia, unspecified: Secondary | ICD-10-CM

## 2017-01-06 MED ORDER — SODIUM CHLORIDE 0.9 % IV SOLN
Freq: Once | INTRAVENOUS | Status: AC
  Administered 2017-01-06: 14:00:00 via INTRAVENOUS

## 2017-01-06 MED ORDER — SODIUM CHLORIDE 0.9 % IV SOLN
510.0000 mg | Freq: Once | INTRAVENOUS | Status: AC
  Administered 2017-01-06: 510 mg via INTRAVENOUS
  Filled 2017-01-06: qty 17

## 2017-01-06 NOTE — Progress Notes (Signed)
Patient tolerated treatment well. Monitored for 30 minutes post transfusion. Patient and vital signs stable upon discharge.  

## 2017-01-06 NOTE — Patient Instructions (Signed)
Ferumoxytol injection What is this medicine? FERUMOXYTOL is an iron complex. Iron is used to make healthy red blood cells, which carry oxygen and nutrients throughout the body. This medicine is used to treat iron deficiency anemia in people with chronic kidney disease. COMMON BRAND NAME(S): Feraheme What should I tell my health care provider before I take this medicine? They need to know if you have any of these conditions: -anemia not caused by low iron levels -high levels of iron in the blood -magnetic resonance imaging (MRI) test scheduled -an unusual or allergic reaction to iron, other medicines, foods, dyes, or preservatives -pregnant or trying to get pregnant -breast-feeding How should I use this medicine? This medicine is for injection into a vein. It is given by a health care professional in a hospital or clinic setting. Talk to your pediatrician regarding the use of this medicine in children. Special care may be needed. What if I miss a dose? It is important not to miss your dose. Call your doctor or health care professional if you are unable to keep an appointment. What may interact with this medicine? This medicine may interact with the following medications: -other iron products What should I watch for while using this medicine? Visit your doctor or healthcare professional regularly. Tell your doctor or healthcare professional if your symptoms do not start to get better or if they get worse. You may need blood work done while you are taking this medicine. You may need to follow a special diet. Talk to your doctor. Foods that contain iron include: whole grains/cereals, dried fruits, beans, or peas, leafy green vegetables, and organ meats (liver, kidney). What side effects may I notice from receiving this medicine? Side effects that you should report to your doctor or health care professional as soon as possible: -allergic reactions like skin rash, itching or hives, swelling of the  face, lips, or tongue -breathing problems -changes in blood pressure -feeling faint or lightheaded, falls -fever or chills -flushing, sweating, or hot feelings -swelling of the ankles or feet Side effects that usually do not require medical attention (report to your doctor or health care professional if they continue or are bothersome): -diarrhea -headache -nausea, vomiting -stomach pain Where should I keep my medicine? This drug is given in a hospital or clinic and will not be stored at home.  2017 Elsevier/Gold Standard (2015-12-13 12:41:49)  

## 2017-01-08 ENCOUNTER — Ambulatory Visit (INDEPENDENT_AMBULATORY_CARE_PROVIDER_SITE_OTHER): Payer: Medicare Other | Admitting: Adult Health

## 2017-01-08 ENCOUNTER — Encounter: Payer: Self-pay | Admitting: Adult Health

## 2017-01-08 VITALS — BP 130/56 | Temp 98.6°F | Ht 72.0 in | Wt 174.6 lb

## 2017-01-08 DIAGNOSIS — J0100 Acute maxillary sinusitis, unspecified: Secondary | ICD-10-CM | POA: Diagnosis not present

## 2017-01-08 MED ORDER — DOXYCYCLINE HYCLATE 100 MG PO CAPS: 100.0000 mg | ORAL_CAPSULE | Freq: Two times a day (BID) | ORAL | 0 refills | Status: DC

## 2017-01-08 NOTE — Patient Instructions (Addendum)
I am going to treat you for a sinus infection with an antibiotic called Doxycycline   You can also use Mucinex to help with your symptoms.    General Recommendations:    Please drink plenty of fluids.  Get plenty of rest   Sleep in humidified air  Use saline nasal sprays  Netti pot   OTC Medications:  Decongestants - helps relieve congestion   Flonase (generic fluticasone) or Nasacort (generic triamcinolone) - please make sure to use the "cross-over" technique at a 45 degree angle towards the opposite eye as opposed to straight up the nasal passageway.   Sudafed (generic pseudoephedrine - Note this is the one that is available behind the pharmacy counter); Products with phenylephrine (-PE) may also be used but is often not as effective as pseudoephedrine.   If you have HIGH BLOOD PRESSURE - Coricidin HBP; AVOID any product that is -D as this contains pseudoephedrine which may increase your blood pressure.  Afrin (oxymetazoline) every 6-8 hours for up to 3 days.   Allergies - helps relieve runny nose, itchy eyes and sneezing   Claritin (generic loratidine), Allegra (fexofenidine), or Zyrtec (generic cyrterizine) for runny nose. These medications should not cause drowsiness.  Note - Benadryl (generic diphenhydramine) may be used however may cause drowsiness  Cough -   Delsym or Robitussin (generic dextromethorphan)  Expectorants - helps loosen mucus to ease removal   Mucinex (generic guaifenesin) as directed on the package.  Headaches / General Aches   Tylenol (generic acetaminophen) - DO NOT EXCEED 3 grams (3,000 mg) in a 24 hour time period  Advil/Motrin (generic ibuprofen)   Sore Throat -   Salt water gargle   Chloraseptic (generic benzocaine) spray or lozenges / Sucrets (generic dyclonine)    Sinusitis Sinusitis is redness, soreness, and inflammation of the paranasal sinuses. Paranasal sinuses are air pockets within the bones of your face (beneath the  eyes, the middle of the forehead, or above the eyes). In healthy paranasal sinuses, mucus is able to drain out, and air is able to circulate through them by way of your nose. However, when your paranasal sinuses are inflamed, mucus and air can become trapped. This can allow bacteria and other germs to grow and cause infection. Sinusitis can develop quickly and last only a short time (acute) or continue over a long period (chronic). Sinusitis that lasts for more than 12 weeks is considered chronic.  CAUSES  Causes of sinusitis include:  Allergies.  Structural abnormalities, such as displacement of the cartilage that separates your nostrils (deviated septum), which can decrease the air flow through your nose and sinuses and affect sinus drainage.  Functional abnormalities, such as when the small hairs (cilia) that line your sinuses and help remove mucus do not work properly or are not present. SIGNS AND SYMPTOMS  Symptoms of acute and chronic sinusitis are the same. The primary symptoms are pain and pressure around the affected sinuses. Other symptoms include:  Upper toothache.  Earache.  Headache.  Bad breath.  Decreased sense of smell and taste.  A cough, which worsens when you are lying flat.  Fatigue.  Fever.  Thick drainage from your nose, which often is green and may contain pus (purulent).  Swelling and warmth over the affected sinuses. DIAGNOSIS  Your health care provider will perform a physical exam. During the exam, your health care provider may:  Look in your nose for signs of abnormal growths in your nostrils (nasal polyps).  Tap over the affected  sinus to check for signs of infection.  View the inside of your sinuses (endoscopy) using an imaging device that has a light attached (endoscope). If your health care provider suspects that you have chronic sinusitis, one or more of the following tests may be recommended:  Allergy tests.  Nasal culture. A sample of  mucus is taken from your nose, sent to a lab, and screened for bacteria.  Nasal cytology. A sample of mucus is taken from your nose and examined by your health care provider to determine if your sinusitis is related to an allergy. TREATMENT  Most cases of acute sinusitis are related to a viral infection and will resolve on their own within 10 days. Sometimes medicines are prescribed to help relieve symptoms (pain medicine, decongestants, nasal steroid sprays, or saline sprays).  However, for sinusitis related to a bacterial infection, your health care provider will prescribe antibiotic medicines. These are medicines that will help kill the bacteria causing the infection.  Rarely, sinusitis is caused by a fungal infection. In theses cases, your health care provider will prescribe antifungal medicine. For some cases of chronic sinusitis, surgery is needed. Generally, these are cases in which sinusitis recurs more than 3 times per year, despite other treatments. HOME CARE INSTRUCTIONS   Drink plenty of water. Water helps thin the mucus so your sinuses can drain more easily.  Use a humidifier.  Inhale steam 3 to 4 times a day (for example, sit in the bathroom with the shower running).  Apply a warm, moist washcloth to your face 3 to 4 times a day, or as directed by your health care provider.  Use saline nasal sprays to help moisten and clean your sinuses.  Take medicines only as directed by your health care provider.  If you were prescribed either an antibiotic or antifungal medicine, finish it all even if you start to feel better. SEEK IMMEDIATE MEDICAL CARE IF:  You have increasing pain or severe headaches.  You have nausea, vomiting, or drowsiness.  You have swelling around your face.  You have vision problems.  You have a stiff neck.  You have difficulty breathing. MAKE SURE YOU:   Understand these instructions.  Will watch your condition.  Will get help right away if you  are not doing well or get worse. Document Released: 11/10/2005 Document Revised: 03/27/2014 Document Reviewed: 11/25/2011 [facility] Patient Information 2015 [place], [place]. This information is not intended to replace advice given to you by your health care provider. Make sure you discuss any questions you have with your health care provider.

## 2017-01-08 NOTE — Progress Notes (Signed)
Subjective:    Patient ID: [name] [name], male    DOB: November 03, 1925, 83 y.o.   MRN: [id number]  URI   This is a new problem. The current episode started in the past 7 days. There has been no fever. Associated symptoms include congestion, coughing, headaches, rhinorrhea and sinus pain. Pertinent negatives include no diarrhea, sore throat or wheezing. He has tried nothing for the symptoms.      Review of Systems  Constitutional: Negative.   HENT: Positive for congestion, postnasal drip, rhinorrhea, sinus pain and sinus pressure. Negative for sore throat.   Respiratory: Positive for cough and chest tightness. Negative for shortness of breath and wheezing.   Cardiovascular: Negative.   Gastrointestinal: Negative for diarrhea.  Genitourinary: Negative.   Neurological: Positive for headaches.   Past Medical History:  Diagnosis Date  . Angina    "jaw & elbow was the worse"  . Arrhythmia   . Atrial fibrillation ([place]) 03/2011  . Bilateral cellulitis of lower leg   . Carotid artery occlusion    right total internal artery occlusion  . Coronary artery disease    s/p cardiac cath in 1990s, and cardiolite study  in 2005 showing EF 54%  . Degenerative disc disease    cervical spine  . GERD (gastroesophageal reflux disease)   . Hyperlipidemia   . Hypertension   . Myocardial infarction 1992 and 1994  . Pneumonia ~ 1935  . Skin cancer    forehead  . TIA (transient ischemic attack)   . Urgency incontinence     Social History   Social History  . Marital status: Married    Spouse name: N/A  . Number of children: N/A  . Years of education: N/A   Occupational History  . Not on file.   Social History Main Topics  . Smoking status: Former Smoker    Packs/day: 1.00    Years: 40.00    Types: Cigarettes    Quit date: 09/12/1989  . Smokeless tobacco: Former Systems developer    Quit date: 11/26/1990     Comment: quit around 1992  . Alcohol use No     Comment: former, quit drinking in 1992  .  Drug use: No  . Sexual activity: Not Currently   Other Topics Concern  . 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 AMPUTATION Right 02/21/2015   Procedure: Right 3rd Ray Amputation;  Surgeon: Newt Minion, MD;  Location: [facility];  Service: Orthopedics;  Laterality: Right;  Digital block and ankle block with MAC  . CARDIAC CATHETERIZATION  1990s  . CATARACT EXTRACTION W/ INTRAOCULAR LENS  IMPLANT, BILATERAL    . SKIN CANCER EXCISION  12/2011   forehead    Family History  Problem Relation Age of Onset  . Heart disease Mother   . Malignant hyperthermia Mother   . Hypertension Mother   . Heart disease Father   . Malignant hyperthermia Father   . Hyperlipidemia Father   . Hypertension Father   . Heart disease Sister   . Hypertension Sister   . Heart disease Brother     Allergies  Allergen Reactions  . Levofloxacin Rash    "thought they were sticking swords thru me"  . Klor-Con [employer]  Chloride Er] Diarrhea    DIARRHEA    Current Outpatient Prescriptions on File Prior to Visit  Medication Sig Dispense Refill  . aspirin 81 MG tablet Take 81 mg by mouth at bedtime.     Marland Kitchen atorvastatin (LIPITOR) 10 MG tablet Take 1 tablet (10 mg total) by mouth at bedtime. 90 tablet 1  . Calcium-Vitamin D (CALTRATE 600 PLUS-VIT D PO) Take 1 tablet by mouth at bedtime.     . digoxin (LANOXIN) 0.125 MG tablet Take 1 tablet (125 mcg total) by mouth daily. 90 tablet 3  . Fish Oil-Krill Oil (KRILL OIL PLUS PO) Take 1 tablet by mouth daily. Krill Oil-Omega 3- DHA-EPA 300-90 (81-10) mg    . folic acid (FOLVITE) 1 MG tablet     . furosemide (LASIX) 40 MG tablet Take 1 tablet (40 mg total) by mouth daily. 90 tablet 3  . levothyroxine (SYNTHROID, LEVOTHROID) 100 MCG tablet TAKE 1 TABLET DAILY (OFFICE  VISIT FOR MORE REFILLS) 30 tablet 0  . loperamide (IMODIUM) 2 MG capsule Take 1 capsule (2 mg total) by mouth as needed for diarrhea or loose stools (max 8 mg daily). 30 capsule 0  . Misc Natural Products (PROSTATE HEALTH PO) Take 2 tablets by mouth daily. 160-100-100    . Multiple Vitamin (MULTIVITAMIN WITH MINERALS) TABS tablet Take 1 tablet by mouth daily.    . Multiple Vitamins-Minerals (MEGA MULTI MEN PO) Take 1 tablet by mouth daily. 200-175-250 mcg    . Multiple Vitamins-Minerals (OCUVITE PO) Take 1 tablet by mouth daily.    . nitroGLYCERIN (NITROSTAT) 0.4 MG SL tablet Place 1 tablet (0.4 mg total) under the tongue every 5 (five) minutes as needed for chest pain. 30 tablet 0  . Pediatric Multivitamins-Fl (MULTIPLE VITAMINS/FLUORIDE) 1 MG CHEW Chew 1 tablet by mouth daily.    . predniSONE (DELTASONE) 10 MG tablet     . spironolactone (ALDACTONE) 25 MG tablet Take 25 mg by mouth daily.    . traMADol (ULTRAM) 50 MG tablet Take 1 tablet (50 mg total) by mouth every 6 (six) hours as needed for moderate pain. Take 1-2 tablets by mouth daily as needed for arthritis 30 tablet 0  . warfarin (COUMADIN) 5 MG tablet TAKE AS DIRECTED BY ANTICOAGULATION CLINIC 120 tablet 1  . metoprolol succinate (TOPROL-XL) 50 MG 24 hr tablet Take 1 tablet (50 mg total) by mouth daily. Take with or immediately following a meal. 90 tablet 3  . [DISCONTINUED] diphenhydrAMINE (BENADRYL) 25 MG tablet Take 25 mg by mouth every 6 (six) hours as needed. For itching    . [DISCONTINUED] methotrexate (RHEUMATREX) 2.5 MG tablet Take 10 mg by mouth once a week. Sunday     No current facility-administered medications on file prior to visit.     BP (!) 130/56   Temp 98.6 F (37 C) (Oral)   Ht 6' (1.829 m)   Wt 174 lb 9.6 oz (79.2 kg)   BMI 23.68 kg/m       Objective:   Physical Exam  Constitutional: He is oriented to person, place, and time. He appears well-developed and well-nourished. No distress.  HENT:  Nose:  Mucosal edema and rhinorrhea present. Right sinus exhibits maxillary sinus tenderness. Right sinus exhibits no frontal sinus tenderness. Left sinus exhibits maxillary sinus tenderness.  Mouth/Throat: Uvula is midline, oropharynx is clear and moist and mucous membranes are normal.  Pulmonary/Chest: Effort normal and breath sounds normal. No respiratory distress. He has no wheezes. He has no rales. He  exhibits no tenderness.  Neurological: He is alert and oriented to person, place, and time.  Skin: Skin is warm and dry. No rash noted. He is not diaphoretic. No erythema. No pallor.  Psychiatric: He has a normal mood and affect. His behavior is normal. Judgment and thought content normal.  Nursing note and vitals reviewed.     Assessment & Plan:  1. Acute non-recurrent maxillary sinusitis - Will treat due to age and multiple co-morbidities  - doxycycline (VIBRAMYCIN) 100 MG capsule; Take 1 capsule (100 mg total) by mouth 2 (two) times daily.  Dispense: 14 capsule; Refill: 0 - Add mucinex.  - Follow up if no improvement   [name], NP

## 2017-01-18 ENCOUNTER — Other Ambulatory Visit: Payer: Self-pay | Admitting: Cardiology

## 2017-01-19 ENCOUNTER — Ambulatory Visit (INDEPENDENT_AMBULATORY_CARE_PROVIDER_SITE_OTHER): Payer: Medicare Other | Admitting: Family Medicine

## 2017-01-19 ENCOUNTER — Ambulatory Visit (INDEPENDENT_AMBULATORY_CARE_PROVIDER_SITE_OTHER): Payer: Medicare Other

## 2017-01-19 ENCOUNTER — Encounter: Payer: Self-pay | Admitting: Family Medicine

## 2017-01-19 VITALS — BP 126/74 | HR 50 | Temp 97.7°F | Wt 173.0 lb

## 2017-01-19 DIAGNOSIS — J399 Disease of upper respiratory tract, unspecified: Secondary | ICD-10-CM

## 2017-01-19 DIAGNOSIS — I4891 Unspecified atrial fibrillation: Secondary | ICD-10-CM

## 2017-01-19 DIAGNOSIS — R05 Cough: Secondary | ICD-10-CM

## 2017-01-19 DIAGNOSIS — Z5181 Encounter for therapeutic drug level monitoring: Secondary | ICD-10-CM

## 2017-01-19 DIAGNOSIS — R059 Cough, unspecified: Secondary | ICD-10-CM

## 2017-01-19 LAB — POCT INR: INR: 4.9

## 2017-01-19 MED ORDER — BENZONATATE 100 MG PO CAPS: 100.0000 mg | ORAL_CAPSULE | Freq: Three times a day (TID) | ORAL | 0 refills | Status: DC

## 2017-01-19 NOTE — Progress Notes (Signed)
Subjective:    Patient ID: [name] [name], male    DOB: 10-Mar-1925, 83 y.o.   MRN: [id number]  HPI  [name] [name] is an 83 year old male who presents today with a cough and rhinitis that has persisted since his visit on 01/08/17. On 01/08/17 he was seen after one week of symptoms and reported congestions, coughing, headaches, rhinorrhea, and sinus pain.  He was treated with doxycycline due to advanced age and chronic conditions.  Today he reports cough that is productive with yellow sputum, rhinitis which has improved over the course of the day. He denies fever, chills, sweats, N/V/D, sore throat, wheezing or myalgias. He reports that symptoms has improved. Appetite is good and he reports drinking fluids and his urine is pale yellow. Aggravating factor noted as post nasal drip in the evening when he goes to bed. Alleviating factors is noted in the day when he is able to blow his nose.   Review of Systems  Constitutional: Negative for chills and fever.  HENT: Positive for postnasal drip and rhinorrhea.   Respiratory: Positive for cough. Negative for shortness of breath and wheezing.   Cardiovascular: Negative for chest pain, palpitations and leg swelling.  Gastrointestinal: Negative for abdominal pain, diarrhea, nausea and vomiting.  Musculoskeletal: Negative for myalgias.  Skin: Negative for rash.  Neurological: Negative for dizziness and headaches.   Past Medical History:  Diagnosis Date  . Angina    "jaw & elbow was the worse"  . Arrhythmia   . Atrial fibrillation ([place]) 03/2011  . Bilateral cellulitis of lower leg   . Carotid artery occlusion    right total internal artery occlusion  . Coronary artery disease    s/p cardiac cath in 1990s, and cardiolite study  in 2005 showing EF 54%  . Degenerative disc disease    cervical spine  . GERD (gastroesophageal reflux disease)   . Hyperlipidemia   . Hypertension   . Myocardial infarction 1992 and 1994  . Pneumonia ~ 1935  . Skin  cancer    forehead  . TIA (transient ischemic attack)   . Urgency incontinence      Social History   Social History  . Marital status: Married    Spouse name: N/A  . Number of children: N/A  . Years of education: N/A   Occupational History  . Not on file.   Social History Main Topics  . Smoking status: Former Smoker    Packs/day: 1.00    Years: 40.00    Types: Cigarettes    Quit date: 09/12/1989  . Smokeless tobacco: Former Systems developer    Quit date: 11/26/1990     Comment: quit around 1992  . Alcohol use No     Comment: former, quit drinking in 1992  . Drug use: No  . Sexual activity: Not Currently   Other Topics Concern  . 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 AMPUTATION Right 02/21/2015   Procedure: Right 3rd Ray Amputation;  Surgeon: Newt Minion, MD;  Location: [facility];  Service: Orthopedics;  Laterality: Right;  Digital block and ankle block with MAC  . CARDIAC CATHETERIZATION  1990s  . CATARACT EXTRACTION W/ INTRAOCULAR LENS  IMPLANT, BILATERAL    .  SKIN CANCER EXCISION  12/2011   forehead    Family History  Problem Relation Age of Onset  . Heart disease Mother   . Malignant hyperthermia Mother   . Hypertension Mother   . Heart disease Father   . Malignant hyperthermia Father   . Hyperlipidemia Father   . Hypertension Father   . Heart disease Sister   . Hypertension Sister   . Heart disease Brother     Allergies  Allergen Reactions  . Levofloxacin Rash    "thought they were sticking swords thru me"  . Klor-Con [Potassium Chloride Er] Diarrhea    DIARRHEA    Current Outpatient Prescriptions on File Prior to Visit  Medication Sig Dispense Refill  . aspirin 81 MG tablet Take 81 mg by mouth at bedtime.     Marland Kitchen atorvastatin (LIPITOR) 10 MG tablet TAKE 1  TABLET AT BEDTIME 90 tablet 1  . Calcium-Vitamin D (CALTRATE 600 PLUS-VIT D PO) Take 1 tablet by mouth at bedtime.     . digoxin (LANOXIN) 0.125 MG tablet Take 1 tablet (125 mcg total) by mouth daily. 90 tablet 3  . Fish Oil-Krill Oil (KRILL OIL PLUS PO) Take 1 tablet by mouth daily. Krill Oil-Omega 3- DHA-EPA 300-90 (36-62) mg    . folic acid (FOLVITE) 1 MG tablet     . furosemide (LASIX) 40 MG tablet Take 1 tablet (40 mg total) by mouth daily. 90 tablet 3  . levothyroxine (SYNTHROID, LEVOTHROID) 100 MCG tablet TAKE 1 TABLET DAILY (OFFICE VISIT FOR MORE REFILLS) 30 tablet 0  . loperamide (IMODIUM) 2 MG capsule Take 1 capsule (2 mg total) by mouth as needed for diarrhea or loose stools (max 8 mg daily). 30 capsule 0  . Misc Natural Products (PROSTATE HEALTH PO) Take 2 tablets by mouth daily. 160-100-100    . Multiple Vitamin (MULTIVITAMIN WITH MINERALS) TABS tablet Take 1 tablet by mouth daily.    . Multiple Vitamins-Minerals (MEGA MULTI MEN PO) Take 1 tablet by mouth daily. 200-175-250 mcg    . Multiple Vitamins-Minerals (OCUVITE PO) Take 1 tablet by mouth daily.    . nitroGLYCERIN (NITROSTAT) 0.4 MG SL tablet Place 1 tablet (0.4 mg total) under the tongue every 5 (five) minutes as needed for chest pain. 30 tablet 0  . Pediatric Multivitamins-Fl (MULTIPLE VITAMINS/FLUORIDE) 1 MG CHEW Chew 1 tablet by mouth daily.    . predniSONE (DELTASONE) 10 MG tablet     . spironolactone (ALDACTONE) 25 MG tablet Take 25 mg by mouth daily.    . traMADol (ULTRAM) 50 MG tablet Take 1 tablet (50 mg total) by mouth every 6 (six) hours as needed for moderate pain. Take 1-2 tablets by mouth daily as needed for arthritis 30 tablet 0  . warfarin (COUMADIN) 5 MG tablet TAKE AS DIRECTED BY ANTICOAGULATION CLINIC 120 tablet 1  . metoprolol succinate (TOPROL-XL) 50 MG 24 hr tablet Take 1 tablet (50 mg total) by mouth daily. Take with or immediately following a meal. 90 tablet 3  . [DISCONTINUED] diphenhydrAMINE (BENADRYL)  25 MG tablet Take 25 mg by mouth every 6 (six) hours as needed. For itching    . [DISCONTINUED] methotrexate (RHEUMATREX) 2.5 MG tablet Take 10 mg by mouth once a week. Sunday     No current facility-administered medications on file prior to visit.     BP 126/74 (BP Location: Left Arm, Patient Position: Sitting, Cuff Size: Normal)   Pulse (!) 50   Temp 97.7 F (36.5 C) (Oral)  Wt 173 lb (78.5 kg)   SpO2 96%   BMI 23.46 kg/m        Objective:   Physical Exam  Constitutional: He is oriented to person, place, and time.  Thin elderly male who appears optimally nourished. Ambulates with a cane  HENT:  Right Ear: Tympanic membrane normal.  Left Ear: Tympanic membrane normal.  Nose: Rhinorrhea present. Right sinus exhibits no maxillary sinus tenderness and no frontal sinus tenderness. Left sinus exhibits no maxillary sinus tenderness and no frontal sinus tenderness.  Mouth/Throat: Mucous membranes are normal. No oropharyngeal exudate or posterior oropharyngeal erythema.  Eyes: Pupils are equal, round, and reactive to light. No scleral icterus.  Neck: Neck supple.  Cardiovascular: Normal rate and regular rhythm.   Pulmonary/Chest: Effort normal and breath sounds normal. He has no wheezes. He has no rales.  Lymphadenopathy:    He has no cervical adenopathy.  Neurological: He is alert and oriented to person, place, and time. Coordination normal.  Skin: Skin is warm and dry. No rash noted.       Assessment & Plan:  1. Upper respiratory disease Resolving; symptoms are improving; exam is reassuring; advised patient that cough can linger 3 to 6 weeks and advised supportive measures. Follow up if symptoms do not continue to improve with treatment, worsen, or he develops a fever >100.  2. Cough  - benzonatate (TESSALON) 100 MG capsule; Take 1 capsule (100 mg total) by mouth 3 (three) times daily.  Dispense: 20 capsule; Refill: 0  Delano Metz, FNP-C

## 2017-01-19 NOTE — Progress Notes (Signed)
I have reviewed and agree with note, evaluation, plan. Thankful for closer recheck at 2 weeks.   Garret Reddish, MD

## 2017-01-19 NOTE — Patient Instructions (Signed)
Pre visit review using our clinic review tool, if applicable. No additional management support is needed unless otherwise documented below in the visit note.  INR today 4.9  Patient reports recent acute respiratory infection with recent completion of 7 days of bid doxycycline.  Today, he is not feeling much improvement and will be seeing an MD after coumadin clinic.  Will hold coumadin today 2/26 and tomorrow 2/28 and then have him take decreased weekly dosing of 1 pill (5mg ) daily and recheck in 2 weeks.  I have asked patient to stop back by and inform me if MD today restarts any antibiotic therapy for further dosing reduction considerations.  He currently denies any abnormal bruising or bleeding and will go to ER if any concerns develop.  Patient was educated on risks associated with his supratherapeutic level and he verbalizes understanding of all instructions given today.

## 2017-01-19 NOTE — Patient Instructions (Signed)
Your symptoms are due to an upper respiratory infection that is improving. Please drink plenty of water, enough to keep your urine pale yellow or clear. Room temperature water is better than hot or cold drinks which can stimulate cough. If your symptoms do not continue to improve, worsen, or you develop a fever >100, please follow up for further evaluation.   Upper Respiratory Infection, Adult Most upper respiratory infections (URIs) are caused by a virus. A URI affects the nose, throat, and upper air passages. The most common type of URI is often called "the common cold." Follow these instructions at home:  Take medicines only as told by your doctor.  Gargle warm saltwater or take cough drops to comfort your throat as told by your doctor.  Use a warm mist humidifier or inhale steam from a shower to increase air moisture. This may make it easier to breathe.  Drink enough fluid to keep your pee (urine) clear or pale yellow.  Eat soups and other clear broths.  Have a healthy diet.  Rest as needed.  Go back to work when your fever is gone or your doctor says it is okay.  You may need to stay home longer to avoid giving your URI to others.  You can also wear a face mask and wash your hands often to prevent spread of the virus.  Use your inhaler more if you have asthma.  Do not use any tobacco products, including cigarettes, chewing tobacco, or electronic cigarettes. If you need help quitting, ask your doctor. Contact a doctor if:  You are getting worse, not better.  Your symptoms are not helped by medicine.  You have chills.  You are getting more short of breath.  You have brown or red mucus.  You have yellow or brown discharge from your nose.  You have pain in your face, especially when you bend forward.  You have a fever.  You have puffy (swollen) neck glands.  You have pain while swallowing.  You have white areas in the back of your throat. Get help right away  if:  You have very bad or constant:  Headache.  Ear pain.  Pain in your forehead, behind your eyes, and over your cheekbones (sinus pain).  Chest pain.  You have long-lasting (chronic) lung disease and any of the following:  Wheezing.  Long-lasting cough.  Coughing up blood.  A change in your usual mucus.  You have a stiff neck.  You have changes in your:  Vision.  Hearing.  Thinking.  Mood. This information is not intended to replace advice given to you by your health care provider. Make sure you discuss any questions you have with your health care provider. Document Released: 04/28/2008 Document Revised: 07/13/2016 Document Reviewed: 02/15/2014 Elsevier Interactive Patient Education  2017 [employer].

## 2017-01-19 NOTE — Progress Notes (Signed)
Pre visit review using our clinic review tool, if applicable. No additional management support is needed unless otherwise documented below in the visit note. 

## 2017-01-22 ENCOUNTER — Telehealth: Payer: Self-pay | Admitting: Hematology

## 2017-01-22 NOTE — Telephone Encounter (Signed)
Moved 3/9 f/u with Dr. Talbert Cage to 3/13 with Dr. Irene Limbo. Lab remains 3/6. Spoke with patient re provider change and confirmed both appointment date/times. Schedule mailed.

## 2017-01-27 ENCOUNTER — Other Ambulatory Visit: Payer: Medicare Other

## 2017-01-27 ENCOUNTER — Other Ambulatory Visit (HOSPITAL_BASED_OUTPATIENT_CLINIC_OR_DEPARTMENT_OTHER): Payer: Medicare Other

## 2017-01-27 DIAGNOSIS — D509 Iron deficiency anemia, unspecified: Secondary | ICD-10-CM | POA: Diagnosis present

## 2017-01-27 DIAGNOSIS — D508 Other iron deficiency anemias: Secondary | ICD-10-CM

## 2017-01-27 LAB — CBC & DIFF AND RETIC
BASO%: 0.3 % (ref 0.0–2.0)
Basophils Absolute: 0 10*3/uL (ref 0.0–0.1)
EOS ABS: 0.8 10*3/uL — AB (ref 0.0–0.5)
EOS%: 9 % — ABNORMAL HIGH (ref 0.0–7.0)
HCT: 33.2 % — ABNORMAL LOW (ref 38.4–49.9)
HEMOGLOBIN: 10.4 g/dL — AB (ref 13.0–17.1)
IMMATURE RETIC FRACT: 9.7 % (ref 3.00–10.60)
LYMPH#: 1 10*3/uL (ref 0.9–3.3)
LYMPH%: 10.9 % — ABNORMAL LOW (ref 14.0–49.0)
MCH: 28.2 pg (ref 27.2–33.4)
MCHC: 31.3 g/dL — ABNORMAL LOW (ref 32.0–36.0)
MCV: 90 fL (ref 79.3–98.0)
MONO#: 0.9 10*3/uL (ref 0.1–0.9)
MONO%: 9.9 % (ref 0.0–14.0)
NEUT%: 69.9 % (ref 39.0–75.0)
NEUTROS ABS: 6.4 10*3/uL (ref 1.5–6.5)
Platelets: 211 10*3/uL (ref 140–400)
RBC: 3.69 10*6/uL — AB (ref 4.20–5.82)
RDW: 20.5 % — ABNORMAL HIGH (ref 11.0–14.6)
RETIC %: 1.92 % — AB (ref 0.80–1.80)
RETIC CT ABS: 70.85 10*3/uL (ref 34.80–93.90)
WBC: 9.2 10*3/uL (ref 4.0–10.3)

## 2017-01-27 LAB — IRON AND TIBC
%SAT: 25 % (ref 20–55)
Iron: 62 ug/dL (ref 42–163)
TIBC: 245 ug/dL (ref 202–409)
UIBC: 183 ug/dL (ref 117–376)

## 2017-01-27 LAB — FERRITIN: FERRITIN: 340 ng/mL — AB (ref 22–316)

## 2017-01-30 ENCOUNTER — Ambulatory Visit: Payer: Medicare Other | Admitting: Hematology

## 2017-02-02 ENCOUNTER — Ambulatory Visit: Payer: Medicare Other

## 2017-02-03 ENCOUNTER — Ambulatory Visit (HOSPITAL_BASED_OUTPATIENT_CLINIC_OR_DEPARTMENT_OTHER): Payer: Medicare Other | Admitting: Hematology

## 2017-02-03 ENCOUNTER — Encounter: Payer: Self-pay | Admitting: Hematology

## 2017-02-03 VITALS — BP 92/55 | HR 64 | Temp 97.9°F | Resp 16 | Wt 171.0 lb

## 2017-02-03 DIAGNOSIS — M069 Rheumatoid arthritis, unspecified: Secondary | ICD-10-CM | POA: Diagnosis not present

## 2017-02-03 DIAGNOSIS — D638 Anemia in other chronic diseases classified elsewhere: Secondary | ICD-10-CM | POA: Diagnosis not present

## 2017-02-03 DIAGNOSIS — D508 Other iron deficiency anemias: Secondary | ICD-10-CM

## 2017-02-03 MED ORDER — AZITHROMYCIN 250 MG PO TABS: ORAL_TABLET | ORAL | 0 refills | Status: DC

## 2017-02-05 DIAGNOSIS — M25561 Pain in right knee: Secondary | ICD-10-CM | POA: Diagnosis not present

## 2017-02-05 DIAGNOSIS — D649 Anemia, unspecified: Secondary | ICD-10-CM | POA: Diagnosis not present

## 2017-02-05 DIAGNOSIS — Z79899 Other long term (current) drug therapy: Secondary | ICD-10-CM | POA: Diagnosis not present

## 2017-02-05 DIAGNOSIS — M0609 Rheumatoid arthritis without rheumatoid factor, multiple sites: Secondary | ICD-10-CM | POA: Diagnosis not present

## 2017-02-05 DIAGNOSIS — M25562 Pain in left knee: Secondary | ICD-10-CM | POA: Diagnosis not present

## 2017-02-09 ENCOUNTER — Telehealth: Payer: Self-pay | Admitting: Family Medicine

## 2017-02-09 ENCOUNTER — Ambulatory Visit (INDEPENDENT_AMBULATORY_CARE_PROVIDER_SITE_OTHER): Payer: Medicare Other | Admitting: General Practice

## 2017-02-09 DIAGNOSIS — I4891 Unspecified atrial fibrillation: Secondary | ICD-10-CM | POA: Diagnosis not present

## 2017-02-09 DIAGNOSIS — Z5181 Encounter for therapeutic drug level monitoring: Secondary | ICD-10-CM

## 2017-02-09 LAB — POCT INR: INR: 3.5

## 2017-02-09 NOTE — Patient Instructions (Signed)
Pre visit review using our clinic review tool, if applicable. No additional management support is needed unless otherwise documented below in the visit note. 

## 2017-02-09 NOTE — Telephone Encounter (Signed)
Error/ltd ° °

## 2017-02-10 ENCOUNTER — Encounter: Payer: Self-pay | Admitting: Adult Health

## 2017-02-10 ENCOUNTER — Ambulatory Visit (INDEPENDENT_AMBULATORY_CARE_PROVIDER_SITE_OTHER): Payer: Medicare Other | Admitting: Adult Health

## 2017-02-10 VITALS — BP 132/60 | Temp 98.1°F | Ht 72.0 in | Wt 172.3 lb

## 2017-02-10 DIAGNOSIS — J0141 Acute recurrent pansinusitis: Secondary | ICD-10-CM | POA: Diagnosis not present

## 2017-02-10 MED ORDER — DOXYCYCLINE HYCLATE 100 MG PO CAPS: 100.0000 mg | ORAL_CAPSULE | Freq: Two times a day (BID) | ORAL | 0 refills | Status: DC

## 2017-02-10 NOTE — Patient Instructions (Signed)
I have sent in a longer dose of Doxycycline.   Use Mucinex cough for the next week   Follow up if no improvement

## 2017-02-10 NOTE — Progress Notes (Signed)
   Subjective:    Patient ID: [name] [name], male    DOB: [birth year], 83 y.o.   MRN: [id number]  Sinusitis  This is a recurrent problem. The current episode started 1 to 4 weeks ago. The problem has been waxing and waning since onset. There has been no fever. Associated symptoms include congestion, coughing (productive ), headaches and sinus pressure. Pertinent negatives include no ear pain, shortness of breath or sore throat. Past treatments include antibiotics. The treatment provided mild relief.   He was treated by this writer 3 weeks ago for the same symptoms. I had prescribed doxycycline. Per patient " it felt like it was working really good, but when I finished the course, the symptoms returned   Review of Systems  Constitutional: Negative.   HENT: Positive for congestion, rhinorrhea, sinus pain and sinus pressure. Negative for ear pain, sore throat and trouble swallowing.   Respiratory: Positive for cough (productive ). Negative for chest tightness, shortness of breath and wheezing.   Cardiovascular: Negative.   Gastrointestinal: Negative.   Neurological: Positive for headaches.       Objective:   Physical Exam  Constitutional: He is oriented to person, place, and time. He appears well-developed and well-nourished. No distress.  HENT:  Right Ear: Hearing, tympanic membrane, external ear and ear canal normal.  Left Ear: Hearing, tympanic membrane, external ear and ear canal normal.  Nose: Mucosal edema and rhinorrhea present. Right sinus exhibits maxillary sinus tenderness and frontal sinus tenderness. Left sinus exhibits maxillary sinus tenderness and frontal sinus tenderness.  Mouth/Throat: Uvula is midline, oropharynx is clear and moist and mucous membranes are normal.  Cardiovascular: Normal rate, regular rhythm, normal heart sounds and intact distal pulses.  Exam reveals no gallop.   No murmur heard. Pulmonary/Chest: Effort normal and breath sounds normal. No respiratory  distress. He has no wheezes. He has no rales. He exhibits no tenderness.  Neurological: He is alert and oriented to person, place, and time.  Skin: Skin is warm and dry. No rash noted. He is not diaphoretic. No erythema. No pallor.  Psychiatric: He has a normal mood and affect. His behavior is normal. Judgment and thought content normal.  Nursing note and vitals reviewed.     Assessment & Plan:  1. Acute recurrent pansinusitis - doxycycline (VIBRAMYCIN) 100 MG capsule; Take 1 capsule (100 mg total) by mouth 2 (two) times daily.  Dispense: 20 capsule; Refill: 0 - Add Mucinex  - Follow up if no improvement   [name], NP

## 2017-02-16 ENCOUNTER — Other Ambulatory Visit: Payer: Self-pay | Admitting: Cardiology

## 2017-02-23 ENCOUNTER — Telehealth: Payer: Self-pay | Admitting: Hematology

## 2017-02-23 NOTE — Telephone Encounter (Signed)
Called patient to inform him of next scheduled appointments. °

## 2017-03-02 ENCOUNTER — Ambulatory Visit (INDEPENDENT_AMBULATORY_CARE_PROVIDER_SITE_OTHER): Payer: Medicare Other | Admitting: *Deleted

## 2017-03-02 DIAGNOSIS — I4891 Unspecified atrial fibrillation: Secondary | ICD-10-CM

## 2017-03-02 DIAGNOSIS — Z5181 Encounter for therapeutic drug level monitoring: Secondary | ICD-10-CM | POA: Diagnosis not present

## 2017-03-02 LAB — POCT INR: INR: 3.2

## 2017-03-02 NOTE — Patient Instructions (Signed)
Pre visit review using our clinic review tool, if applicable. No additional management support is needed unless otherwise documented below in the visit note. 

## 2017-03-05 ENCOUNTER — Encounter: Payer: Self-pay | Admitting: Podiatry

## 2017-03-05 ENCOUNTER — Ambulatory Visit (INDEPENDENT_AMBULATORY_CARE_PROVIDER_SITE_OTHER): Payer: Medicare Other | Admitting: Podiatry

## 2017-03-05 DIAGNOSIS — M79676 Pain in unspecified toe(s): Secondary | ICD-10-CM | POA: Diagnosis not present

## 2017-03-05 DIAGNOSIS — B351 Tinea unguium: Secondary | ICD-10-CM

## 2017-03-05 DIAGNOSIS — Q828 Other specified congenital malformations of skin: Secondary | ICD-10-CM

## 2017-03-05 NOTE — Progress Notes (Signed)
Patient ID: [name] [name], male   DOB: 11-22-1925, 83 y.o.   MRN: [id number] HPI  Complaint:  Visit Type: Patient returns to my office for continued preventative foot care services. Complaint: Patient states" my nails have grown long and thick and become painful to walk and wear shoes" . He presents for preventative foot care services. No changes to ROS  Podiatric Exam: Vascular: dorsalis pedis and posterior tibial pulses are negative. Capillary return is immediate. Temperature gradient is negative. Skin turgor WNL,   Sensorium: Normal Semmes Weinstein monofilament test. Normal tactile sensation bilaterally.  Nail Exam: Pt has thick disfigured discolored nails with subungual debris noted bilateral entire nail hallux through fifth toenails Ulcer Exam: There is no evidence of ulcer or pre-ulcerative changes or infection. Orthopedic Exam: Muscle tone and strength are WNL. No limitations in general ROM. No crepitus or effusions noted. Foot type and digits show no abnormalities. Bony prominences are unremarkable. Amputation third toe right foot. Skin: No Porokeratosis. No infection or ulcers.  Painful callus sub 1 B/L  Diagnosis:  Tinea unguium, Pain in right toe, pain in left toes, Porokeratosis  Treatment & Plan Procedures and Treatment: Consent by patient was obtained for treatment procedures. The patient understood the discussion of treatment and procedures well. All questions were answered thoroughly reviewed. Debridement of mycotic and hypertrophic toenails, 1 through 5 bilateral and clearing of subungual debris. No ulceration, no infection noted. Debride porokeratosis B/L Return Visit-Office Procedure: Patient instructed to return to the office for a follow up visit 10 weeks for continued evaluation and treatment.  Gardiner Barefoot DPM

## 2017-03-23 ENCOUNTER — Other Ambulatory Visit: Payer: Self-pay | Admitting: Family Medicine

## 2017-03-23 ENCOUNTER — Ambulatory Visit (INDEPENDENT_AMBULATORY_CARE_PROVIDER_SITE_OTHER): Payer: Medicare Other | Admitting: General Practice

## 2017-03-23 DIAGNOSIS — Z5181 Encounter for therapeutic drug level monitoring: Secondary | ICD-10-CM

## 2017-03-23 DIAGNOSIS — I4891 Unspecified atrial fibrillation: Secondary | ICD-10-CM

## 2017-03-23 LAB — POCT INR: INR: 1.6

## 2017-03-23 NOTE — Patient Instructions (Signed)
Pre visit review using our clinic review tool, if applicable. No additional management support is needed unless otherwise documented below in the visit note. 

## 2017-04-02 NOTE — Progress Notes (Signed)
[name] [name]  HEMATOLOGY ONCOLOGY PROGRESS NOTE  Date of service: .02/03/2017  Patient Care Team: Dorena Cookey, MD as PCP - General  CC: f/u for Anemia  INTERVAL HISTORY:  Patient is here for his multifactorial anemia s/p IV iron replacement. He notes he feels a little better after his IV iron. Tolerated the IV Feraheme well without any issues. Continues to be on his oral prednisone and off the methotrexate since November 2017 for his rheumatoid arthritis. Continues to be on Coumadin for his atrial fibrillation. Notes no overt evidence of melena, hematochezia, hemoptysis or hematuria. Weight has been fairly stable. No unexpected weight loss, night sweats fevers or chills.  REVIEW OF SYSTEMS:    10 Point review of systems of done and is negative except as noted above.  . Past Medical History:  Diagnosis Date  . Angina    "jaw & elbow was the worse"  . Arrhythmia   . Atrial fibrillation ([place]) 03/2011  . Bilateral cellulitis of lower leg   . Carotid artery occlusion    right total internal artery occlusion  . Coronary artery disease    s/p cardiac cath in 1990s, and cardiolite study  in 2005 showing EF 54%  . Degenerative disc disease    cervical spine  . GERD (gastroesophageal reflux disease)   . Hyperlipidemia   . Hypertension   . Myocardial infarction ([place]) 1992 and 1994  . Pneumonia ~ 1935  . Skin cancer    forehead  . TIA (transient ischemic attack)   . Urgency incontinence     . Past Surgical History:  Procedure Laterality Date  . AMPUTATION Right 02/21/2015   Procedure: Right 3rd Ray Amputation;  Surgeon: Newt Minion, MD;  Location: [place];  Service: Orthopedics;  Laterality: Right;  Digital block and ankle block with MAC  . CARDIAC CATHETERIZATION  1990s  . CATARACT EXTRACTION W/ INTRAOCULAR LENS  IMPLANT, BILATERAL    . SKIN CANCER EXCISION  12/2011   forehead    . Social History  Substance Use Topics  . Smoking status: Former Smoker    Packs/day: 1.00   Years: 40.00    Types: Cigarettes    Quit date: 09/12/1989  . Smokeless tobacco: Former Systems developer    Quit date: 11/26/1990     Comment: quit around 1992  . Alcohol use No     Comment: former, quit drinking in 1992    ALLERGIES:  is allergic to levofloxacin and klor-con [potassium chloride er].  MEDICATIONS:  Current Outpatient Prescriptions  Medication Sig Dispense Refill  . aspirin 81 MG tablet Take 81 mg by mouth at bedtime.     [name] [name] atorvastatin (LIPITOR) 10 MG tablet TAKE 1 TABLET AT BEDTIME 90 tablet 1  . Calcium-Vitamin D (CALTRATE 600 PLUS-VIT D PO) Take 1 tablet by mouth at bedtime.     . digoxin (LANOXIN) 0.125 MG tablet Take 1 tablet (125 mcg total) by mouth daily. 90 tablet 3  . Fish Oil-Krill Oil (KRILL OIL PLUS PO) Take 1 tablet by mouth daily. Krill Oil-Omega 3- DHA-EPA 300-90 (46-65) mg    . folic acid (FOLVITE) 1 MG tablet     . levothyroxine (SYNTHROID, LEVOTHROID) 100 MCG tablet TAKE 1 TABLET DAILY (OFFICE VISIT FOR MORE REFILLS) 30 tablet 0  . loperamide (IMODIUM) 2 MG capsule Take 1 capsule (2 mg total) by mouth as needed for diarrhea or loose stools (max 8 mg daily). 30 capsule 0  . Misc Natural Products (PROSTATE HEALTH PO) Take 2 tablets  by mouth daily. 160-100-100    . Multiple Vitamin (MULTIVITAMIN WITH MINERALS) TABS tablet Take 1 tablet by mouth daily.    . Multiple Vitamins-Minerals (MEGA MULTI MEN PO) Take 1 tablet by mouth daily. 200-175-250 mcg    . Multiple Vitamins-Minerals (OCUVITE PO) Take 1 tablet by mouth daily.    . nitroGLYCERIN (NITROSTAT) 0.4 MG SL tablet Place 1 tablet (0.4 mg total) under the tongue every 5 (five) minutes as needed for chest pain. 30 tablet 0  . Pediatric Multivitamins-Fl (MULTIPLE VITAMINS/FLUORIDE) 1 MG CHEW Chew 1 tablet by mouth daily.    . predniSONE (DELTASONE) 10 MG tablet     . spironolactone (ALDACTONE) 25 MG tablet Take 25 mg by mouth daily.    . traMADol (ULTRAM) 50 MG tablet Take 1 tablet (50 mg total) by mouth every 6  (six) hours as needed for moderate pain. Take 1-2 tablets by mouth daily as needed for arthritis 30 tablet 0  . warfarin (COUMADIN) 5 MG tablet TAKE AS DIRECTED BY ANTICOAGULATION CLINIC 120 tablet 1  . doxycycline (VIBRAMYCIN) 100 MG capsule Take 1 capsule (100 mg total) by mouth 2 (two) times daily. 20 capsule 0  . furosemide (LASIX) 40 MG tablet Take 1 tablet (40 mg total) by mouth daily. 90 tablet 1  . metoprolol succinate (TOPROL-XL) 50 MG 24 hr tablet Take 1 tablet (50 mg total) by mouth daily. Take with or immediately following a meal. 90 tablet 3   No current facility-administered medications for this visit.     PHYSICAL EXAMINATION: ECOG PERFORMANCE STATUS: 1 - Symptomatic but completely ambulatory  . Vitals:   02/03/17 1609  BP: (!) 92/55  Pulse: 64  Resp: 16  Temp: 97.9 F (36.6 C)    Filed Weights   02/03/17 1609  Weight: 171 lb (77.6 kg)   .Body mass index is 23.19 kg/m.  GENERAL:alert, in no acute distress and comfortable SKIN: no acute rashes, no significant lesions EYES: conjunctiva are pink and non-injected, sclera anicteric OROPHARYNX: MMM, no exudates, no oropharyngeal erythema or ulceration NECK: supple, no JVD LYMPH:  no palpable lymphadenopathy in the cervical, axillary or inguinal regions LUNGS: clear to auscultation b/l with normal respiratory effort HEART: regular rate & rhythm ABDOMEN:  normoactive bowel sounds , non tender, not distended.No palpable hepatosplenomegaly Extremity: no pedal edema PSYCH: alert & oriented x 3 with fluent speech NEURO: no focal motor/sensory deficits  LABORATORY DATA:   I have reviewed the data as listed  . CBC Latest Ref Rng & Units 01/27/2017 12/08/2016 01/07/2016  WBC 4.0 - 10.3 10e3/uL 9.2 9.2 7.5  Hemoglobin 13.0 - 17.1 g/dL 10.4(L) 9.5(L) 10.1(L)  Hematocrit 38.4 - 49.9 % 33.2(L) 30.6(L) 31.1(L)  Platelets 140 - 400 10e3/uL 211 240 220   . CBC    Component Value Date/Time   WBC 9.2 01/27/2017 1216   WBC  7.5 01/07/2016 1402   RBC 3.69 (L) 01/27/2017 1216   RBC 3.42 (L) 01/07/2016 1402   HGB 10.4 (L) 01/27/2017 1216   HCT 33.2 (L) 01/27/2017 1216   PLT 211 01/27/2017 1216   MCV 90.0 01/27/2017 1216   MCH 28.2 01/27/2017 1216   MCH 29.5 01/07/2016 1402   MCHC 31.3 (L) 01/27/2017 1216   MCHC 32.5 01/07/2016 1402   RDW 20.5 (H) 01/27/2017 1216   LYMPHSABS 1.0 01/27/2017 1216   MONOABS 0.9 01/27/2017 1216   EOSABS 0.8 (H) 01/27/2017 1216   BASOSABS 0.0 01/27/2017 1216    . CMP Latest Ref Rng &  Units 12/08/2016 01/07/2016 05/18/2015  Glucose 65 - 99 mg/dL - 68 83  BUN 7 - 25 mg/dL - 12 10  Creatinine 0.70 - 1.11 mg/dL - 1.23(H) 1.21  Sodium 135 - 146 mmol/L - 140 137  Potassium 3.5 - 5.3 mmol/L - 3.5 4.0  Chloride 98 - 110 mmol/L - 98 98  CO2 20 - 31 mmol/L - 33(H) 35(H)  Calcium 8.6 - 10.3 mg/dL - 9.1 9.1  Total Protein 6.0 - 8.5 g/dL 6.1 6.5 6.6  Total Bilirubin 0.2 - 1.2 mg/dL - 0.4 0.5  Alkaline Phos 40 - 115 U/L - 58 74  AST 10 - 35 U/L - 42(H) 46(H)  ALT 9 - 46 U/L - 30 28   . Lab Results  Component Value Date   IRON 62 01/27/2017   TIBC 245 01/27/2017   IRONPCTSAT 25 01/27/2017   (Iron and TIBC)  Lab Results  Component Value Date   FERRITIN 340 (H) 01/27/2017   . Lab Results  Component Value Date   LDH 210 12/08/2016   Component     Latest Ref Rng & Units 12/08/2016 12/08/2016         1:50 PM  1:50 PM  Interpretation         Comment:         Specimen:         Submitted Dx:         Viability:         Cell Population         Granulocytes:         Monocytes:         Antibodies Performed:         Director Review         Total Protein     6.0 - 8.5 g/dL  6.1  Albumin     2.9 - 4.4 g/dL  3.7  Alpha 1     0.0 - 0.4 g/dL  0.2  Alpha 2     0.4 - 1.0 g/dL  0.6  Beta     0.7 - 1.3 g/dL  0.9  Gamma Globulin     0.4 - 1.8 g/dL  0.7  M-SPIKE, %     Not Observed g/dL  Not Observed  Globulin, Total     2.2 - 3.9 g/dL  2.4  A/G Ratio     0.7 -  1.7  1.5  Please Note:       Comment  Interpretation(See Below)       Comment  HEMOGLOBIN F QUANTITATION     0.0 - 2.0 %  0.0  Hgb A     96.4 - 98.8 %  98.0  HGB S     0.0 %  0.0  HGB C     0.0 %  0.0  HEMOGLOBIN A2 QUANTITATION     1.8 - 3.2 %  2.0  HGB VARIANT     0.0 %  0.0  HGB INTERPRETATION       Comment  Erythropoietin     2.6 - 18.5 mIU/mL  42.6 (H)  Folate     >3.0 ng/mL  >20.0  Vitamin B12     232 - 1245 pg/mL  1,185  Haptoglobin     34 - 200 mg/dL  61  LDH     125 - 245 U/L 210   Methylmalonic Acid, Serum     0 - 378 nmol/L  310    RADIOGRAPHIC STUDIES: I  have personally reviewed the radiological images as listed and agreed with the findings in the report. No results found.  ASSESSMENT & PLAN:   83 yo with   1) Multifactorial Normocytic Anemia. This appears to be primarily related to anemia of chronic inflammation related to his rheumatoid arthritis along with perhaps some element of functional iron deficiency. It was also partially related to his methotrexate which he is off of since November 2017. Hemoglobin is improved to 10.5 after IV iron and his ferritin is now at its goal of more than 100. Cannot rule out an element of low level MDS. Plan -The indication for PRBC transfusion for additional IV iron at this time. -Optimize treatment for his rheumatoid arthritis. -Monitor for slow blood loss with ongoing anticoagulation with Coumadin -If his anemia worsens despite adequate ferritin levels of more than 100 and no overt bleeding related to consider additional workup with a bone marrow examination or with empiric use of EPO. -All lab results were discussed in detail with the patient and he is quite thankful for all the care and attention he has received.  Return to care with Dr. Irene Limbo in 3 months with repeat labs  I spent 20 minutes counseling the patient face to face. The total time spent in the appointment was 30 minutes and more than 50% was on  counseling and direct patient cares.    Sullivan Lone MD [place] AAHIVMS [facility] Hematology/Oncology Physician [facility]  (Office):       [phone] (Work cell):  [phone] (Fax):           [phone]

## 2017-04-13 ENCOUNTER — Ambulatory Visit (INDEPENDENT_AMBULATORY_CARE_PROVIDER_SITE_OTHER): Payer: Medicare Other | Admitting: General Practice

## 2017-04-13 DIAGNOSIS — I4891 Unspecified atrial fibrillation: Secondary | ICD-10-CM

## 2017-04-13 DIAGNOSIS — Z5181 Encounter for therapeutic drug level monitoring: Secondary | ICD-10-CM

## 2017-04-13 LAB — POCT INR: INR: 1.3

## 2017-04-13 NOTE — Patient Instructions (Signed)
Pre visit review using our clinic review tool, if applicable. No additional management support is needed unless otherwise documented below in the visit note. 

## 2017-04-29 ENCOUNTER — Ambulatory Visit (INDEPENDENT_AMBULATORY_CARE_PROVIDER_SITE_OTHER): Payer: Medicare Other | Admitting: General Practice

## 2017-04-29 DIAGNOSIS — Z5181 Encounter for therapeutic drug level monitoring: Secondary | ICD-10-CM

## 2017-04-29 DIAGNOSIS — I4891 Unspecified atrial fibrillation: Secondary | ICD-10-CM

## 2017-04-29 LAB — POCT INR: INR: 2.2

## 2017-04-29 NOTE — Patient Instructions (Signed)
Pre visit review using our clinic review tool, if applicable. No additional management support is needed unless otherwise documented below in the visit note. 

## 2017-04-29 NOTE — Progress Notes (Signed)
I agree with this plan.

## 2017-04-30 ENCOUNTER — Ambulatory Visit: Payer: Medicare Other | Admitting: Adult Health

## 2017-05-01 ENCOUNTER — Encounter: Payer: Self-pay | Admitting: Adult Health

## 2017-05-01 ENCOUNTER — Ambulatory Visit (INDEPENDENT_AMBULATORY_CARE_PROVIDER_SITE_OTHER): Payer: Medicare Other | Admitting: Adult Health

## 2017-05-01 VITALS — BP 102/64 | HR 68

## 2017-05-01 DIAGNOSIS — I4891 Unspecified atrial fibrillation: Secondary | ICD-10-CM | POA: Diagnosis not present

## 2017-05-01 DIAGNOSIS — M069 Rheumatoid arthritis, unspecified: Secondary | ICD-10-CM | POA: Diagnosis not present

## 2017-05-01 DIAGNOSIS — I10 Essential (primary) hypertension: Secondary | ICD-10-CM | POA: Diagnosis not present

## 2017-05-01 DIAGNOSIS — E039 Hypothyroidism, unspecified: Secondary | ICD-10-CM | POA: Diagnosis not present

## 2017-05-01 DIAGNOSIS — Z7689 Persons encountering health services in other specified circumstances: Secondary | ICD-10-CM | POA: Diagnosis not present

## 2017-05-01 NOTE — Progress Notes (Signed)
Patient presents to clinic today to establish care. He is a pleasant 83 year old male who  has a past medical history of Angina; Arrhythmia; Atrial fibrillation ([place]) (03/2011); Bilateral cellulitis of lower leg; Carotid artery occlusion; Coronary artery disease; Degenerative disc disease; GERD (gastroesophageal reflux disease); Hyperlipidemia; Hypertension; Myocardial infarction ([place]) (1992 and 1994); Pneumonia (~ 1935); Skin cancer; TIA (transient ischemic attack); and Urgency incontinence.  He is a former patient of Dr. Sherren Mocha  Acute Concerns: Establish Care   Chronic Issues: Hypothyroidism - appears to be controlled with Synthroid 136mg   CHF/Afib/- followed by cardiology. Continues to be Coumadin  Hyperlipidemia - Controlled with statin   Health Maintenance: Dental -- Dentures Vision -- Routine  Immunizations -- UTD  Colonoscopy -- No longer needs   He is followed by  Rheumatology - [place] patient - needs a referral to someone else as Dr. TCharlestine Nightis retiring. Is seen for arthritis.  Cardiology - Dr. NMeda Coffee Hematology - Anemia  - does iron infusion  Dermatology  Podiatry    Past Medical History:  Diagnosis Date  . Angina    "jaw & elbow was the worse"  . Arrhythmia   . [place]rial [place] 03/2011  . Bilateral cellulitis of lower leg   . Carotid artery occlusion    right total internal artery occlusion  . Coronary artery disease    s/p cardiac cath in 1990s, and cardiolite study  in 2005 showing EF 54%  . Degenerative disc disease    cervical spine  . GERD (gastroesophageal reflux disease)   . Hyperlipidemia   . Hypertension   . [place]ocardial infarction ([place] 1992 and 1994  . Pneumonia ~ 1935  . Skin cancer    forehead  . TIA (transient ischemic attack)   . Urgency incontinence     Past Surgical History:  Procedure Laterality Date  . AMPUTATION Right 02/21/2015   Procedure: Right 3rd Ray Amputation;  Surgeon: MNewt Minion MD;  [place]   Service: Orthopedics;  Laterality: Right;  Digital block and ankle block with MAC  . CARDIAC CATHETERIZATION  1990s  . CATARACT EXTRACTION W/ INTRAOCULAR LENS  IMPLANT, BILATERAL    . SKIN CANCER EXCISION  12/2011   forehead    Current Outpatient Prescriptions on File Prior to Visit  Medication Sig Dispense Refill  . aspirin 81 MG tablet Take 81 mg by mouth at bedtime.     .Marland Kitchenatorvastatin (LIPITOR) 10 MG tablet TAKE 1 TABLET AT BEDTIME 90 tablet 1  . Calcium-Vitamin D (CALTRATE 600 PLUS-VIT D PO) Take 1 tablet by mouth at bedtime.     . digoxin (LANOXIN) 0.125 MG tablet Take 1 tablet (125 mcg total) by mouth daily. 90 tablet 3  . Fish Oil-Krill Oil (KRILL OIL PLUS PO) Take 1 tablet by mouth daily. Krill Oil-Omega 3- DHA-EPA 300-90 (291-47 mg    . folic acid (FOLVITE) 1 MG tablet     . furosemide (LASIX) 40 MG tablet Take 1 tablet (40 mg total) by mouth daily. 90 tablet 1  . levothyroxine (SYNTHROID, LEVOTHROID) 100 MCG tablet TAKE 1 TABLET DAILY (OFFICE VISIT FOR MORE REFILLS) 30 tablet 0  . loperamide (IMODIUM) 2 MG capsule Take 1 capsule (2 mg total) by mouth as needed for diarrhea or loose stools (max 8 mg daily). 30 capsule 0  . Misc Natural Products (PROSTATE HEALTH PO) Take 2 tablets by mouth daily. 160-100-100    . Multiple Vitamin (MULTIVITAMIN WITH MINERALS) TABS tablet Take  1 tablet by mouth daily.    . Multiple Vitamins-Minerals (MEGA MULTI MEN PO) Take 1 tablet by mouth daily. 200-175-250 mcg    . Multiple Vitamins-Minerals (OCUVITE PO) Take 1 tablet by mouth daily.    . nitroGLYCERIN (NITROSTAT) 0.4 MG SL tablet Place 1 tablet (0.4 mg total) under the tongue every 5 (five) minutes as needed for chest pain. 30 tablet 0  . Pediatric Multivitamins-Fl (MULTIPLE VITAMINS/FLUORIDE) 1 MG CHEW Chew 1 tablet by mouth daily.    . predniSONE (DELTASONE) 10 MG tablet     . spironolactone (ALDACTONE) 25 MG tablet Take 25 mg by mouth daily.    . traMADol (ULTRAM) 50 MG tablet Take 1 tablet  (50 mg total) by mouth every 6 (six) hours as needed for moderate pain. Take 1-2 tablets by mouth daily as needed for arthritis 30 tablet 0  . warfarin (COUMADIN) 5 MG tablet TAKE AS DIRECTED BY ANTICOAGULATION CLINIC 120 tablet 1  . metoprolol succinate (TOPROL-XL) 50 MG 24 hr tablet Take 1 tablet (50 mg total) by mouth daily. Take with or immediately following a meal. 90 tablet 3  . [DISCONTINUED] diphenhydrAMINE (BENADRYL) 25 MG tablet Take 25 mg by mouth every 6 (six) hours as needed. For itching    . [DISCONTINUED] methotrexate (RHEUMATREX) 2.5 MG tablet Take 10 mg by mouth once a week. Sunday     No current facility-administered medications on file prior to visit.     Allergies  Allergen Reactions  . Levofloxacin Rash    "thought they were sticking swords thru me"  . Klor-Con [Potassium Chloride Er] Diarrhea    DIARRHEA    Family History  Problem Relation Age of Onset  . Heart disease Mother   . Malignant hyperthermia Mother   . Hypertension Mother   . Heart disease Father   . Malignant hyperthermia Father   . Hyperlipidemia Father   . Hypertension Father   . Heart disease Sister   . Hypertension Sister   . Heart disease Brother     Social History   Social History  . Marital status: Married    Spouse name: N/A  . Number of children: N/A  . Years of education: N/A   Occupational History  . Not on file.   Social History Main Topics  . Smoking status: Former Smoker    Packs/day: 1.00    Years: 40.00    Types: Cigarettes    Quit date: 09/12/1989  . Smokeless tobacco: Former Systems developer    Quit date: 11/26/1990     Comment: quit around 1992  . Alcohol use No     Comment: former, quit drinking in 1992  . Drug use: No  . Sexual activity: Not Currently   Other Topics Concern  . 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   HENT: Positive for hearing loss.   Eyes: Negative.   Respiratory: Negative.   Cardiovascular: Negative.   Gastrointestinal: Negative.   Genitourinary: Negative.   Musculoskeletal: Positive for back pain.  Skin: Negative.   Neurological: Negative.   Endo/Heme/Allergies: Bruises/bleeds easily.  Psychiatric/Behavioral: Negative.     BP 102/64 (BP Location: Left Arm, Patient Position: Sitting, Cuff Size: Normal)   Pulse 68   SpO2 96%   Physical Exam  Constitutional: He is oriented to person, place, and time and well-developed, well-nourished, and in no distress. No distress.  HENT:  Head: Normocephalic and atraumatic.  Right Ear: External ear normal.  Left Ear: External ear normal.  Nose: Nose normal.  Mouth/Throat: Oropharynx is clear and moist.  Eyes: Conjunctivae and EOM are normal. Pupils are equal, round, and reactive to light. Right eye exhibits no discharge. Left eye exhibits no discharge.  Neck: Normal range of motion. Neck supple. No thyromegaly present.  Cardiovascular: Normal rate and intact distal pulses.  An irregularly irregular rhythm present. Exam reveals no gallop and no friction rub.   Murmur heard. Pulmonary/Chest: Effort normal and breath sounds normal. No respiratory distress. He has no wheezes. He has no rales. He exhibits no tenderness.  Musculoskeletal: He exhibits edema. He exhibits no tenderness or deformity.  Non pitting edema of lower extremities. Very dry skin  Walks with a quad cane   Lymphadenopathy:    He has no cervical adenopathy.  Neurological: He is alert and oriented to person, place, and time. Gait normal. GCS score is 15.  Skin: Skin is warm and dry. He is not diaphoretic. No erythema. No pallor.  Psychiatric: Mood, memory, affect and judgment normal.  Nursing note and vitals reviewed.   Recent Results (from the past 2160 hour(s))  POCT INR     Status: None   Collection Time: 02/09/17  12:00 AM  Result Value Ref Range   INR 3.5   POCT INR     Status: None   Collection Time: 03/02/17  3:31 PM  Result Value Ref Range   INR 3.2   POCT INR     Status: None   Collection Time: 03/23/17 12:00 AM  Result Value Ref Range   INR 1.6   POCT INR     Status: None   Collection Time: 04/13/17 12:00 AM  Result Value Ref Range   INR 1.3   POCT INR     Status: None   Collection Time: 04/29/17 12:00 AM  Result Value Ref Range   INR 2.2     Assessment/Plan: 1. Encounter to establish care - Very pleasant 83 year old man - Follow up this summer for annual exam  - Continue to stay as active as he can  - Follow up for any acute issues   2. Rheumatoid arthritis involving both hands, unspecified rheumatoid factor presence ([place])  - Ambulatory referral to Rheumatology  3. Benign essential HTN - Controlled   4. Atrial fibrillation, unspecified type ([place]) - Continue with coumadin and Metoprolol for rate control   5. Hypothyroidism, unspecified type - Continue with synthroid  - Will recheck levels at annual exam    [name], NP

## 2017-05-01 NOTE — Patient Instructions (Signed)
It was great seeing you again   Someone from Rheumatology will call you to schedule your appointment   Please follow up with me this summer for your annual exam

## 2017-05-05 ENCOUNTER — Ambulatory Visit (HOSPITAL_BASED_OUTPATIENT_CLINIC_OR_DEPARTMENT_OTHER): Payer: Medicare Other | Admitting: Hematology

## 2017-05-05 ENCOUNTER — Telehealth: Payer: Self-pay | Admitting: Hematology

## 2017-05-05 ENCOUNTER — Other Ambulatory Visit (HOSPITAL_BASED_OUTPATIENT_CLINIC_OR_DEPARTMENT_OTHER): Payer: Medicare Other

## 2017-05-05 ENCOUNTER — Encounter: Payer: Self-pay | Admitting: Hematology

## 2017-05-05 VITALS — BP 116/55 | HR 65 | Temp 98.8°F | Resp 17 | Ht 72.0 in | Wt 186.6 lb

## 2017-05-05 DIAGNOSIS — Z7901 Long term (current) use of anticoagulants: Secondary | ICD-10-CM

## 2017-05-05 DIAGNOSIS — M069 Rheumatoid arthritis, unspecified: Secondary | ICD-10-CM

## 2017-05-05 DIAGNOSIS — D508 Other iron deficiency anemias: Secondary | ICD-10-CM

## 2017-05-05 DIAGNOSIS — D649 Anemia, unspecified: Secondary | ICD-10-CM

## 2017-05-05 DIAGNOSIS — D638 Anemia in other chronic diseases classified elsewhere: Secondary | ICD-10-CM

## 2017-05-05 LAB — CBC & DIFF AND RETIC
BASO%: 0.7 % (ref 0.0–2.0)
Basophils Absolute: 0 10*3/uL (ref 0.0–0.1)
EOS%: 6 % (ref 0.0–7.0)
Eosinophils Absolute: 0.4 10*3/uL (ref 0.0–0.5)
HCT: 33.7 % — ABNORMAL LOW (ref 38.4–49.9)
HEMOGLOBIN: 11 g/dL — AB (ref 13.0–17.1)
Immature Retic Fract: 8.8 % (ref 3.00–10.60)
LYMPH%: 17.1 % (ref 14.0–49.0)
MCH: 31.6 pg (ref 27.2–33.4)
MCHC: 32.6 g/dL (ref 32.0–36.0)
MCV: 96.8 fL (ref 79.3–98.0)
MONO#: 0.4 10*3/uL (ref 0.1–0.9)
MONO%: 6.3 % (ref 0.0–14.0)
NEUT%: 69.9 % (ref 39.0–75.0)
NEUTROS ABS: 4.2 10*3/uL (ref 1.5–6.5)
PLATELETS: 201 10*3/uL (ref 140–400)
RBC: 3.48 10*6/uL — AB (ref 4.20–5.82)
RDW: 15.3 % — ABNORMAL HIGH (ref 11.0–14.6)
Retic %: 1.54 % (ref 0.80–1.80)
Retic Ct Abs: 53.59 10*3/uL (ref 34.80–93.90)
WBC: 6 10*3/uL (ref 4.0–10.3)
lymph#: 1 10*3/uL (ref 0.9–3.3)

## 2017-05-05 LAB — COMPREHENSIVE METABOLIC PANEL
ALBUMIN: 3.5 g/dL (ref 3.5–5.0)
ALK PHOS: 62 U/L (ref 40–150)
ALT: 31 U/L (ref 0–55)
ANION GAP: 6 meq/L (ref 3–11)
AST: 45 U/L — ABNORMAL HIGH (ref 5–34)
BUN: 12.4 mg/dL (ref 7.0–26.0)
CO2: 32 mEq/L — ABNORMAL HIGH (ref 22–29)
Calcium: 9.5 mg/dL (ref 8.4–10.4)
Chloride: 102 mEq/L (ref 98–109)
Creatinine: 1.1 mg/dL (ref 0.7–1.3)
EGFR: 62 mL/min/{1.73_m2} — AB (ref >90)
Glucose: 88 mg/dl (ref 70–140)
POTASSIUM: 3.9 meq/L (ref 3.5–5.1)
SODIUM: 139 meq/L (ref 136–145)
Total Bilirubin: 0.75 mg/dL (ref 0.20–1.20)
Total Protein: 6.2 g/dL — ABNORMAL LOW (ref 6.4–8.3)

## 2017-05-05 LAB — IRON AND TIBC
%SAT: 18 % — ABNORMAL LOW (ref 20–55)
IRON: 54 ug/dL (ref 42–163)
TIBC: 291 ug/dL (ref 202–409)
UIBC: 238 ug/dL (ref 117–376)

## 2017-05-05 LAB — FERRITIN: FERRITIN: 74 ng/mL (ref 22–316)

## 2017-05-05 NOTE — Patient Instructions (Signed)
Thank you for choosing [facility] to provide your oncology and hematology care.  To afford each patient quality time with our providers, please arrive 30 minutes before your scheduled appointment time.  If you arrive late for your appointment, you may be asked to reschedule.  We strive to give you quality time with our providers, and arriving late affects you and other patients whose appointments are after yours.  If you are a no show for multiple scheduled visits, you may be dismissed from the clinic at the providers discretion.   Again, thank you for choosing [facility], our hope is that these requests will decrease the amount of time that you wait before being seen by our physicians.  ______________________________________________________________________ Should you have questions after your visit to the [facility], please contact our office at [phone] between the hours of 8:30 and 4:30 p.m.    Voicemails left after 4:30p.m will not be returned until the following business day.   For prescription refill requests, please have your pharmacy contact us directly.  Please also try to allow 48 hours for prescription requests.   Please contact the scheduling department for questions regarding scheduling.  For scheduling of procedures such as PET scans, CT scans, MRI, Ultrasound, etc please contact central scheduling at [phone].   Resources For Cancer Patients and Caregivers:  American Cancer Society:  800-227-2345  Can help patients locate various types of support and financial assistance Cancer Care: 1-800-813-HOPE (4673) Provides financial assistance, online support groups, medication/co-pay assistance.   [place] DSS:  336-641-3447 Where to apply for food stamps, Medicaid, and utility assistance Medicare Rights Center: 800-333-4114 Helps people with Medicare understand their rights and benefits, navigate the Medicare system, and secure the  quality healthcare they deserve SCAT: 336-333-6589 [place] [employer]'s shared-ride transportation service for eligible riders who have a disability that prevents them from riding the fixed route bus.   For additional information on assistance programs please contact our social worker:   Grier Hock/Abigail Elmore:  [phone] 

## 2017-05-05 NOTE — Telephone Encounter (Signed)
Scheduled appt per 6/12 los - Lab and f/u in 6 months - sent reminder letter in the mail.

## 2017-05-11 ENCOUNTER — Telehealth: Payer: Self-pay | Admitting: Cardiology

## 2017-05-11 ENCOUNTER — Other Ambulatory Visit: Payer: Self-pay | Admitting: *Deleted

## 2017-05-11 MED ORDER — NITROGLYCERIN 0.4 MG SL SUBL: 0.4000 mg | SUBLINGUAL_TABLET | SUBLINGUAL | 0 refills | Status: DC | PRN

## 2017-05-11 NOTE — Telephone Encounter (Signed)
°*  STAT* If patient is at the pharmacy, call can be transferred to refill team.   1. Which medications need to be refilled? (please list name of each medication and dose if known) Nitroglycerin 0.4 mg  2. Which pharmacy/location (including street and city if local pharmacy) is medication to be sent to?[id number] - [place], [place] [address]  3. Do they need a 30 day or 90 day supply?

## 2017-05-14 DIAGNOSIS — M79643 Pain in unspecified hand: Secondary | ICD-10-CM | POA: Diagnosis not present

## 2017-05-14 DIAGNOSIS — M1990 Unspecified osteoarthritis, unspecified site: Secondary | ICD-10-CM | POA: Diagnosis not present

## 2017-05-14 DIAGNOSIS — M069 Rheumatoid arthritis, unspecified: Secondary | ICD-10-CM | POA: Diagnosis not present

## 2017-05-14 DIAGNOSIS — M19041 Primary osteoarthritis, right hand: Secondary | ICD-10-CM | POA: Diagnosis not present

## 2017-05-14 DIAGNOSIS — R21 Rash and other nonspecific skin eruption: Secondary | ICD-10-CM | POA: Diagnosis not present

## 2017-05-15 ENCOUNTER — Encounter: Payer: Self-pay | Admitting: Cardiology

## 2017-05-18 NOTE — Progress Notes (Signed)
Subjective:   [name] [name] is a 83 y.o. male who presents for Medicare Annual/Subsequent preventive examination.  Productive cough with yellow mucous and nasal drainage x 1 month. States he was using mucinex and it helped but he ran out. Patients nasal drainage is bright red in office.    Review of Systems:  No ROS.  Medicare Wellness Visit. Additional risk factors are reflected in the social history.  Cardiac Risk Factors include: advanced age (>89mn, >>87women);male gender;hypertension;dyslipidemia   Sleep patterns: Home Safety/Smoke Alarms: Feels safe in home. Smoke alarms in place.  Living environment; residence and Firearm Safety: Lives alone in one story home. Seat Belt Safety/Bike Helmet: Wears seat belt.   Counseling:   Dental- Dentures  Male:   CCS-    Aged out. PSA-  Lab Results  Component Value Date   PSA 0.48 05/18/2015   PSA 0.43 02/24/2008      Objective:    Vitals: BP (!) 110/58 (BP Location: Left Arm, Patient Position: Sitting, Cuff Size: Normal)   Pulse 67   Resp 16   Ht _0  (1.803 m)   Wt 171 lb 6.4 oz (77.7 kg)   SpO2 97%   BMI 23.91 kg/m   Body mass index is 23.91 kg/m.  Tobacco History  Smoking Status  . Former Smoker  . Packs/day: 1.00  . Years: 40.00  . Types: Cigarettes  . Quit date: 09/12/1989  Smokeless Tobacco  . Former [employer] developer . Quit date: 11/26/1990    Comment: quit around 1992     Counseling given: Not Answered   Past Medical History:  Diagnosis Date  . Angina    "jaw & elbow was the worse"  . Arrhythmia   . [place]  . Carotid artery occlusion    right total internal artery occlusion  . Coronary artery disease    s/p cardiac cath in 1990s, and cardiolite study  in 2005 showing EF 54%  . Degenerative disc disease    cervical spine  . GERD (gastroesophageal reflux disease)   . Hyperlipidemia   . Hypertension   . Hypothyroidism   . [place]  .  Pneumonia ~ 1935  . [place]oid [place]   . Skin cancer    forehead  . TIA (transient ischemic attack)   . Urgency incontinence    Past Surgical History:  Procedure Laterality Date  . AMPUTATION Right 02/21/2015   Procedure: Right 3rd Ray Amputation;  Surgeon: MNewt Minion MD;  [place]  Service: Orthopedics;  Laterality: Right;  Digital block and ankle block with MAC  . CARDIAC CATHETERIZATION  1990s  . CATARACT EXTRACTION W/ INTRAOCULAR LENS  IMPLANT, BILATERAL    . SKIN CANCER EXCISION  12/2011   forehead   Family History  Problem Relation Age of Onset  . Heart disease Mother   . Malignant hyperthermia Mother   . Hypertension Mother   . Heart disease Father   . Malignant hyperthermia Father   . Hyperlipidemia Father   . Hypertension Father   . Heart disease Sister   . Hypertension Sister   . Heart disease Brother    History  Sexual Activity  . Sexual activity: Not Currently    Outpatient Encounter Prescriptions as of 05/21/2017  Medication Sig  . aspirin 81 MG tablet Take 81 mg by mouth at bedtime.   .Marland Kitchenatorvastatin (LIPITOR) 10 MG tablet TAKE 1 TABLET AT BEDTIME  . Calcium-Vitamin D (  CALTRATE 600 PLUS-VIT D PO) Take 1 tablet by mouth at bedtime.   . digoxin (LANOXIN) 0.125 MG tablet Take 1 tablet (125 mcg total) by mouth daily.  . Fish Oil-Krill Oil (KRILL OIL PLUS PO) Take 1 tablet by mouth daily. Krill Oil-Omega 3- DHA-EPA 300-90 (20-35) mg  . folic acid (FOLVITE) 1 MG tablet   . furosemide (LASIX) 40 MG tablet Take 1 tablet (40 mg total) by mouth daily.  Marland Kitchen levothyroxine (SYNTHROID, LEVOTHROID) 100 MCG tablet TAKE 1 TABLET DAILY (OFFICE VISIT FOR MORE REFILLS)  . loperamide (IMODIUM) 2 MG capsule Take 1 capsule (2 mg total) by mouth as needed for diarrhea or loose stools (max 8 mg daily).  . Misc Natural Products (PROSTATE HEALTH PO) Take 2 tablets by mouth daily. 160-100-100  . Multiple Vitamins-Minerals (MEGA MULTI MEN PO) Take 1 tablet by mouth  daily. 200-175-250 mcg  . Multiple Vitamins-Minerals (OCUVITE PO) Take 1 tablet by mouth daily.  . nitroGLYCERIN (NITROSTAT) 0.4 MG SL tablet Place 1 tablet (0.4 mg total) under the tongue every 5 (five) minutes as needed for chest pain.  . Pediatric Multivitamins-Fl (MULTIPLE VITAMINS/FLUORIDE) 1 MG CHEW Chew 1 tablet by mouth daily.  . predniSONE (DELTASONE) 10 MG tablet   . spironolactone (ALDACTONE) 25 MG tablet Take 25 mg by mouth daily.  . traMADol (ULTRAM) 50 MG tablet Take 1 tablet (50 mg total) by mouth every 6 (six) hours as needed for moderate pain. Take 1-2 tablets by mouth daily as needed for arthritis  . warfarin (COUMADIN) 5 MG tablet TAKE AS DIRECTED BY ANTICOAGULATION CLINIC  . metoprolol succinate (TOPROL-XL) 50 MG 24 hr tablet Take 1 tablet (50 mg total) by mouth daily. Take with or immediately following a meal.   No facility-administered encounter medications on file as of 05/21/2017.     Activities of Daily Living In your present state of health, do you have any difficulty performing the following activities: 05/21/2017  Hearing? N  Vision? N  Difficulty concentrating or making decisions? N  Walking or climbing stairs? Y  Dressing or bathing? N  Doing errands, shopping? N  Preparing Food and eating ? Y  Using the Toilet? N  In the past six months, have you accidently leaked urine? N  Do you have problems with loss of bowel control? N  Managing your Medications? Y  Managing your Finances? Y  Housekeeping or managing your Housekeeping? Y  Some recent data might be hidden    Patient Care Team: [name], NP as PCP - General (Family Medicine)   Assessment:    Physical assessment deferred to PCP.  Exercise Activities and Dietary recommendations   Diet (meal preparation, eat out, water intake, caffeinated beverages, dairy products, fruits and vegetables):  2 meals/day. Snacks on cookies and peanut butter. Drinks 1 quart water/day. 1 cup coffee.  Breakfast:  Cereal Lunch: Skips Dinner:  From a cafeteria or something made from his daughters.    Goals    . LDL CALC < 70      Fall Risk Fall Risk  05/21/2017 07/22/2016 05/18/2015  Falls in the past year? Yes No No  Number falls in past yr: 1 - -  Injury with Fall? No - -  Risk for fall due to : Impaired balance/gait;Impaired mobility - -  Follow up Education provided;Falls prevention discussed - -   Depression Screen PHQ 2/9 Scores 05/21/2017 05/18/2015  PHQ - 2 Score 0 0    Cognitive Function MMSE - Mini Mental State Exam  05/21/2017  Orientation to time 5  Orientation to Place 5  Registration 3  Attention/ Calculation 5  Recall 2  Language- name 2 objects 2  Language- repeat 1  Language- follow 3 step command 3  Language- read & follow direction 1  Write a sentence 1  Copy design 1  Total score 29        Immunization History  Administered Date(s) Administered  . Influenza Split 08/25/2011, 08/09/2012  . Influenza Whole 08/29/2008, 09/07/2009, 08/15/2010  . Influenza, High Dose Seasonal PF 07/26/2015, 07/21/2016  . Influenza,inj,Quad PF,36+ Mos 08/09/2013, 02/23/2015  . Influenza-Unspecified 07/25/2014  . Pneumococcal Conjugate-13 05/18/2015  . Pneumococcal Polysaccharide-23 08/25/2011  . Tdap 10/30/2014  . Zoster 05/15/2014   Screening Tests Health Maintenance  Topic Date Due  . INFLUENZA VACCINE  06/24/2017  . TETANUS/TDAP  10/30/2024  . PNA vac Low Risk Adult  Completed      Plan:   Pt will schedule an acute visit due to cold symptoms lasting over 1 month. Instructed patient to contact office immediately or go to ED if his nose will not stop bleeding. Bleeding controlled in office today.  I have personally reviewed and noted the following in the patient's chart:   . Medical and social history . Use of alcohol, tobacco or illicit drugs  . Current medications and supplements . Functional ability and status . Nutritional status . Physical activity . Advanced  directives . List of other physicians . Vitals . Screenings to include cognitive, depression, and falls . Referrals and appointments  In addition, I have reviewed and discussed with patient certain preventive protocols, quality metrics, and best practice recommendations. A written personalized care plan for preventive services as well as general preventive health recommendations were provided to patient.     Ree Edman, RN  05/21/2017

## 2017-05-18 NOTE — Progress Notes (Signed)
Pre visit review using our clinic review tool, if applicable. No additional management support is needed unless otherwise documented below in the visit note. 

## 2017-05-21 ENCOUNTER — Ambulatory Visit (INDEPENDENT_AMBULATORY_CARE_PROVIDER_SITE_OTHER): Payer: Medicare Other

## 2017-05-21 VITALS — BP 110/58 | HR 67 | Resp 16 | Ht 71.0 in | Wt 171.4 lb

## 2017-05-21 DIAGNOSIS — Z0000 Encounter for general adult medical examination without abnormal findings: Secondary | ICD-10-CM | POA: Diagnosis not present

## 2017-05-21 NOTE — Progress Notes (Signed)
Reviewed and agree with above.  Colin Benton R., DO

## 2017-05-21 NOTE — Patient Instructions (Addendum)
Schedule an appointment for you cold symptoms. Bring a copy of your advance directives to your next office visit. Fall Prevention in the Home Falls can cause injuries and can affect people from all age groups. There are many simple things that you can do to make your home safe and to help prevent falls. What can I do on the outside of my home?  Regularly repair the edges of walkways and driveways and fix any cracks.  Remove high doorway thresholds.  Trim any shrubbery on the main path into your home.  Use bright outdoor lighting.  Clear walkways of debris and clutter, including tools and rocks.  Regularly check that handrails are securely fastened and in good repair. Both sides of any steps should have handrails.  Install guardrails along the edges of any raised decks or porches.  Have leaves, snow, and ice cleared regularly.  Use sand or salt on walkways during winter months.  In the garage, clean up any spills right away, including grease or oil spills. What can I do in the bathroom?  Use night lights.  Install grab bars by the toilet and in the tub and shower. Do not use towel bars as grab bars.  Use non-skid mats or decals on the floor of the tub or shower.  If you need to sit down while you are in the shower, use a plastic, non-slip stool.  Keep the floor dry. Immediately clean up any water that spills on the floor.  Remove soap buildup in the tub or shower on a regular basis.  Attach bath mats securely with double-sided non-slip rug tape.  Remove throw rugs and other tripping hazards from the floor. What can I do in the bedroom?  Use night lights.  Make sure that a bedside light is easy to reach.  Do not use oversized bedding that drapes onto the floor.  Have a firm chair that has side arms to use for getting dressed.  Remove throw rugs and other tripping hazards from the floor. What can I do in the kitchen?  Clean up any spills right away.  Avoid  walking on wet floors.  Place frequently used items in easy-to-reach places.  If you need to reach for something above you, use a sturdy step stool that has a grab bar.  Keep electrical cables out of the way.  Do not use floor polish or wax that makes floors slippery. If you have to use wax, make sure that it is non-skid floor wax.  Remove throw rugs and other tripping hazards from the floor. What can I do in the stairways?  Do not leave any items on the stairs.  Make sure that there are handrails on both sides of the stairs. Fix handrails that are broken or loose. Make sure that handrails are as long as the stairways.  Check any carpeting to make sure that it is firmly attached to the stairs. Fix any carpet that is loose or worn.  Avoid having throw rugs at the top or bottom of stairways, or secure the rugs with carpet tape to prevent them from moving.  Make sure that you have a light switch at the top of the stairs and the bottom of the stairs. If you do not have them, have them installed. What are some other fall prevention tips?  Wear closed-toe shoes that fit well and support your feet. Wear shoes that have rubber soles or low heels.  When you use a stepladder, make sure that it  is completely opened and that the sides are firmly locked. Have someone hold the ladder while you are using it. Do not climb a closed stepladder.  Add color or contrast paint or tape to grab bars and handrails in your home. Place contrasting color strips on the first and last steps.  Use mobility aids as needed, such as canes, walkers, scooters, and crutches.  Turn on lights if it is dark. Replace any light bulbs that burn out.  Set up furniture so that there are clear paths. Keep the furniture in the same spot.  Fix any uneven floor surfaces.  Choose a carpet design that does not hide the edge of steps of a stairway.  Be aware of any and all pets.  Review your medicines with your healthcare  provider. Some medicines can cause dizziness or changes in blood pressure, which increase your risk of falling. Talk with your health care provider about other ways that you can decrease your risk of falls. This may include working with a physical therapist or trainer to improve your strength, balance, and endurance. This information is not intended to replace advice given to you by your health care provider. Make sure you discuss any questions you have with your health care provider. Document Released: 10/31/2002 Document Revised: 04/08/2016 Document Reviewed: 12/15/2014 Elsevier Interactive Patient Education  2017 [employer].

## 2017-05-22 ENCOUNTER — Ambulatory Visit (INDEPENDENT_AMBULATORY_CARE_PROVIDER_SITE_OTHER): Payer: Medicare Other | Admitting: Family Medicine

## 2017-05-22 ENCOUNTER — Encounter: Payer: Self-pay | Admitting: Family Medicine

## 2017-05-22 VITALS — BP 104/64 | HR 62 | Temp 98.3°F | Wt 172.1 lb

## 2017-05-22 DIAGNOSIS — R040 Epistaxis: Secondary | ICD-10-CM

## 2017-05-22 MED ORDER — NITROGLYCERIN 0.4 MG SL SUBL: 0.4000 mg | SUBLINGUAL_TABLET | SUBLINGUAL | 0 refills | Status: DC | PRN

## 2017-05-22 NOTE — Patient Instructions (Signed)
Nosebleed, Adult A nosebleed is when blood comes out of the nose. Nosebleeds are common. Usually, they are not a sign of a serious condition. Nosebleeds can happen if a small blood vessel in your nose starts to bleed or if the lining of your nose (mucous membrane) cracks. They are commonly caused by:  Allergies.  Colds.  Picking your nose.  Blowing your nose too hard.  An injury from sticking an object into your nose or getting hit in the nose.  Dry or cold air.  Less common causes of nosebleeds include:  Toxic fumes.  Something abnormal in the nose or in the air-filled spaces in the bones of the face (sinuses).  Growths in the nose, such as polyps.  Medicines or conditions that cause blood to clot slowly.  Certain illnesses or procedures that irritate or dry out the nasal passages.  Follow these instructions at home: When you have a nosebleed:  Sit down and tilt your head slightly forward.  Use a clean towel or tissue to pinch your nostrils under the bony part of your nose. After 10 minutes, let go of your nose and see if bleeding starts again. Do not release pressure before that time. If there is still bleeding, repeat the pinching and holding for 10 minutes until the bleeding stops.  Do not place tissues or gauze in the nose to stop bleeding.  Avoid lying down and avoid tilting your head backward. That may make blood collect in the throat and cause gagging or coughing.  Use a nasal spray decongestant to help with a nosebleed as told by your health care provider.  Do not use petroleum jelly or mineral oil in your nose. It can drip into your lungs. After a nosebleed:  Avoid blowing your nose or sniffing for a number of hours.  Avoid straining, lifting, or bending at the waist for several days. You may resume other normal activities as you are able.  Use saline spray or a humidifier as told by your health care provider.  Aspirinand blood thinners make bleeding more  likely. If you are prescribed these medicines and you suffer from nosebleeds: ? Ask your health care provider if you should stop taking the medicines or if you should adjust the dose. ? Do not stop taking medicines that your health care provider has recommended unless told by your health care provider.  If your nosebleed was caused by dry mucous membranes, use over-the-counter saline nasal spray or gel. This will keep the mucous membranes moist and allow them to heal. If you must use a lubricant: ? Choose one that is water-soluble. ? Use only as much as you need and use it only as often as needed. ? Do not lie down until several hours after you use it. Contact a health care provider if:  You have a fever.  You get nosebleeds often or more often than usual.  You bruise very easily.  You have a nosebleed from having something stuck in your nose.  You have bleeding in your mouth.  You vomit or cough up brown material.  You have a nosebleed after you start a new medicine. Get help right away if:  You have a nosebleed after a fall or a head injury.  Your nosebleed does not go away after 20 minutes.  You feel dizzy or weak.  You have unusual bleeding from other parts of your body.  You have unusual bruising on other parts of your body.  You become sweaty.    You vomit blood. This information is not intended to replace advice given to you by your health care provider. Make sure you discuss any questions you have with your health care provider. Document Released: 08/20/2005 Document Revised: 07/10/2016 Document Reviewed: 05/27/2016 Elsevier Interactive Patient Education  2018 [employer] Inc[employer]  

## 2017-05-22 NOTE — Progress Notes (Signed)
Subjective:     Patient ID: [name] [name], male   DOB: 07/03/1925, 83 y.o.   MRN: [id number]  HPI Patient seen for follow-up regarding nosebleed apparently here in office yesterday when he came in for Medicare wellness visit. He has multiple chronic problems including history of atrial fibrillation and is on Coumadin. Most recent INR was on 05/01/17 was 2.2. He states that nosebleed he thinks was coming from both sides yesterday. He's had no nosebleed whatsoever since then. Denies any nasal congestion. Other than mild bruising of the extremities no other bleeding complications. Generally feels well overall. He had recent CBC 2 weeks ago with hemoglobin 11.0 with normal platelets  He is requesting refill of sublingual nitroglycerin which he not used for quite some time. He has a bottle with him that ran out of date in 2010. Denies any recent chest pains.  Past Medical History:  Diagnosis Date  . Angina    "jaw & elbow was the worse"  . Arrhythmia   . Atrial fibrillation ([place]) 03/2011  . Carotid artery occlusion    right total internal artery occlusion  . Coronary artery disease    s/p cardiac cath in 1990s, and cardiolite study  in 2005 showing EF 54%  . Degenerative disc disease    cervical spine  . GERD (gastroesophageal reflux disease)   . Hyperlipidemia   . Hypertension   . Hypothyroidism   . Myocardial infarction ([place]) 1992 and 1994  . Pneumonia ~ 1935  . Rheumatoid arthritis ([place])   . Skin cancer    forehead  . TIA (transient ischemic attack)   . Urgency incontinence    Past Surgical History:  Procedure Laterality Date  . AMPUTATION Right 02/21/2015   Procedure: Right 3rd Ray Amputation;  Surgeon: Newt Minion, MD;  Location: [facility];  Service: Orthopedics;  Laterality: Right;  Digital block and ankle block with MAC  . CARDIAC CATHETERIZATION  1990s  . CATARACT EXTRACTION W/ INTRAOCULAR LENS  IMPLANT, BILATERAL    . SKIN CANCER EXCISION  12/2011   forehead    reports that  he quit smoking about 27 years ago. His smoking use included Cigarettes. He has a 40.00 pack-year smoking history. He quit smokeless tobacco use about 26 years ago. He reports that he does not drink alcohol or use drugs. family history includes Heart disease in his brother, father, mother, and sister; Hyperlipidemia in his father; Hypertension in his father, mother, and sister; Malignant hyperthermia in his father and mother. Allergies  Allergen Reactions  . Levofloxacin Rash    "thought they were sticking swords thru me"  . Klor-Con [Potassium Chloride Er] Diarrhea    DIARRHEA     Review of Systems  Constitutional: Negative for appetite change and unexpected weight change.  Respiratory: Negative for shortness of breath.   Cardiovascular: Negative for chest pain.       Objective:   Physical Exam  Constitutional: He appears well-developed and well-nourished.  HENT:  Mouth/Throat: Oropharynx is clear and moist.  Both nares are clear completely with no bleeding and no blood clots. No lesions noted.  Cardiovascular: Normal rate.   Irregular rhythm but normal rate  Pulmonary/Chest: Effort normal and breath sounds normal. No respiratory distress. He has no wheezes. He has no rales.       Assessment:     Recent epistaxis yesterday here in office with no evidence for bleeding since then. No obvious source of blood loss noted on exam    Plan:     -  Observation for now -Refilled new prescription for sublingual nitroglycerin as previous prescription out of date -Follow-up promptly for any recurrent nosebleed or other concerns  Eulas Post MD [facility]

## 2017-06-02 ENCOUNTER — Encounter: Payer: Self-pay | Admitting: Cardiology

## 2017-06-02 ENCOUNTER — Ambulatory Visit (INDEPENDENT_AMBULATORY_CARE_PROVIDER_SITE_OTHER): Payer: Medicare Other | Admitting: Cardiology

## 2017-06-02 VITALS — BP 102/58 | HR 72 | Ht 71.0 in | Wt 168.0 lb

## 2017-06-02 DIAGNOSIS — I5033 Acute on chronic diastolic (congestive) heart failure: Secondary | ICD-10-CM

## 2017-06-02 DIAGNOSIS — R600 Localized edema: Secondary | ICD-10-CM | POA: Diagnosis not present

## 2017-06-02 DIAGNOSIS — E785 Hyperlipidemia, unspecified: Secondary | ICD-10-CM

## 2017-06-02 NOTE — Progress Notes (Signed)
06/02/2017 [name] [name]   [birth year]  [id number]  Primary Physician Nafziger, Tommi Rumps, NP Primary Cardiologist: Dr. Meda Coffee    Reason for Visit/CC: 6 month f/u for Atrial fibrillation and chronic diastolic HF.    HPI:  [name] [name] is a 83 y.o. male, followed by Dr. Meda Coffee, who presents to clinic today for 6 month f/u. He has a h/o chronic atrial fibrillation, on chronic anticoagulation with Coumadin. Also a h/o CAD noted on remote cath in the 1990s however negative NST in 2015, prior h/o systolic HF with previous EF of 45-50%, however most recent echo in 2017 showed normalization to 60-65%. Also h/o moderate AS on echo in 2017, HLD and carotid artery disease with 40-59% L ICA stenosis and chronic right ICA occlusion by dopplers in 08/2015. This is followed by vascular surgery.   Today in f/u, he reports that he has done well. He denies CP. No dyspnea. Also no dizziness, syncope/ near syncope. He does however have significant bilateral LEE with 2+ bilateral pitting edema up to the level of his knees. He denies orthopnea/PND and exertional dyspnea. He admits that he has not been 100% compliant with his Lasix, due to the inconvinence of frequent urination. He only took 1/2 of a tablet today. Order is written for 40 mg daily. He notes that some days, he does not take it at all. He also appears to be on prednisone. He reports he has been on this for several months, but does not know why.    Current Meds  Medication Sig  . aspirin 81 MG tablet Take 81 mg by mouth at bedtime.   Marland Kitchen atorvastatin (LIPITOR) 10 MG tablet TAKE 1 TABLET AT BEDTIME  . Calcium-Vitamin D (CALTRATE 600 PLUS-VIT D PO) Take 1 tablet by mouth at bedtime.   . digoxin (LANOXIN) 0.125 MG tablet Take 1 tablet (125 mcg total) by mouth daily.  . Fish Oil-Krill Oil (KRILL OIL PLUS PO) Take 1 tablet by mouth daily. Krill Oil-Omega 3- DHA-EPA 300-90 (79-89) mg  . folic acid (FOLVITE) 1 MG tablet   . furosemide (LASIX) 40 MG tablet  Take 1 tablet (40 mg total) by mouth daily.  Marland Kitchen levothyroxine (SYNTHROID, LEVOTHROID) 100 MCG tablet TAKE 1 TABLET DAILY (OFFICE VISIT FOR MORE REFILLS)  . loperamide (IMODIUM) 2 MG capsule Take 1 capsule (2 mg total) by mouth as needed for diarrhea or loose stools (max 8 mg daily).  . Misc Natural Products (PROSTATE HEALTH PO) Take 2 tablets by mouth daily. 160-100-100  . Multiple Vitamins-Minerals (MEGA MULTI MEN PO) Take 1 tablet by mouth daily. 200-175-250 mcg  . Multiple Vitamins-Minerals (OCUVITE PO) Take 1 tablet by mouth daily.  . nitroGLYCERIN (NITROSTAT) 0.4 MG SL tablet Place 1 tablet (0.4 mg total) under the tongue every 5 (five) minutes as needed for chest pain.  . Pediatric Multivitamins-Fl (MULTIPLE VITAMINS/FLUORIDE) 1 MG CHEW Chew 1 tablet by mouth daily.  . predniSONE (DELTASONE) 10 MG tablet   . spironolactone (ALDACTONE) 25 MG tablet Take 25 mg by mouth daily.  . traMADol (ULTRAM) 50 MG tablet Take 1 tablet (50 mg total) by mouth every 6 (six) hours as needed for moderate pain. Take 1-2 tablets by mouth daily as needed for arthritis  . warfarin (COUMADIN) 5 MG tablet TAKE AS DIRECTED BY ANTICOAGULATION CLINIC   Allergies  Allergen Reactions  . Levofloxacin Rash    "thought they were sticking swords thru me"  . Klor-Con [Potassium Chloride Er] Diarrhea  DIARRHEA   Past Medical History:  Diagnosis Date  . Angina    "jaw & elbow was the worse"  . Arrhythmia   . Atrial fibrillation ([place]) 03/2011  . Carotid artery occlusion    right total internal artery occlusion  . Coronary artery disease    s/p cardiac cath in 1990s, and cardiolite study  in 2005 showing EF 54%  . Degenerative disc disease    cervical spine  . GERD (gastroesophageal reflux disease)   . Hyperlipidemia   . Hypertension   . Hypothyroidism   . Myocardial infarction ([place]) 1992 and 1994  . Pneumonia ~ 1935  . Rheumatoid arthritis ([place])   . Skin cancer    forehead  . TIA (transient ischemic attack)    . Urgency incontinence    Family History  Problem Relation Age of Onset  . Heart disease Mother   . Malignant hyperthermia Mother   . Hypertension Mother   . Heart disease Father   . Malignant hyperthermia Father   . Hyperlipidemia Father   . Hypertension Father   . Heart disease Sister   . Hypertension Sister   . Heart disease Brother    Past Surgical History:  Procedure Laterality Date  . AMPUTATION Right 02/21/2015   Procedure: Right 3rd Ray Amputation;  Surgeon: Newt Minion, MD;  Location: [facility];  Service: Orthopedics;  Laterality: Right;  Digital block and ankle block with MAC  . CARDIAC CATHETERIZATION  1990s  . CATARACT EXTRACTION W/ INTRAOCULAR LENS  IMPLANT, BILATERAL    . SKIN CANCER EXCISION  12/2011   forehead   Social History   Social History  . Marital status: Married    Spouse name: N/A  . Number of children: N/A  . Years of education: N/A   Occupational History  . Not on file.   Social History Main Topics  . Smoking status: Former Smoker    Packs/day: 1.00    Years: 40.00    Types: Cigarettes    Quit date: 09/12/1989  . Smokeless tobacco: Former Systems developer    Quit date: 11/26/1990     Comment: quit around 1992  . Alcohol use No     Comment: former, quit drinking in 1992  . Drug use: No  . Sexual activity: Not Currently   Other Topics Concern  . 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  Cardiovascular: negative for chest pain, dyspnea on exertion, edema, orthopnea, palpitations, paroxysmal nocturnal dyspnea or shortness of breath Dermatological: negative for rash Respiratory: negative for cough or wheezing Urologic: negative for hematuria Abdominal: negative for nausea, vomiting, diarrhea, bright red blood  per rectum, melena, or hematemesis Neurologic: negative for visual changes, syncope, or dizziness All other systems reviewed and are otherwise negative except as noted above.   Physical Exam:  Blood pressure (!) 102/58, pulse 72, height 5' 11"  (1.803 m), weight 168 lb (76.2 kg).  General appearance: alert, cooperative and no distress Neck: no carotid bruit and no JVD Lungs: clear to auscultation bilaterally Heart: irregularly irregular rhythm and regular rate Extremities: 2+ bilateral LEE up to the level of his knees Pulses: 2+ and symmetric Skin: Skin color, texture, turgor normal. No rashes or lesions Neurologic: Grossly  normal  EKG chronic afib with a CVR in the 70s -- personally reviewed   ASSESSMENT AND PLAN:   1. CAD: noted in chart. Apparent CAD noted by cath in the 1990s, however cath report/ angiographic details not available for review. NST in 2015 was negative for ischemia. He denies chest pain and dyspnea. No ASA given chronic coumadin and anemia.   2. Chronic Afib: rate is controlled with metoprolol. CVR on EKG. HR in the 70s. He is on a/c therapy with coumadin. His PCP follows his INRs. He denies abnormal bleeding. No falls.   3. Bilateral Carotid Artery Disease: known 40-59% L ICA stenosis and chronic right ICA occlusion by dopplers in 08/2015. This is followed by vascular surgery. He denies symptoms of TIA. He is on comadin and statin therapy.   4. HLD: on statin therapy with Lipitor. No recent lipid panel on file. Lipid panel has been followed by PCP. We will contact that office to request most recent labs be sent to [place] for review.   5. HTN: controlled on current regimen.   6. Chronic Diastolic HF: most recent echo 12/2015 showed normal LVEF at 60-65%. He has significant bilateral LEE but denies dyspnea, orthopnea and PND. No fatigue or weakness. Not fully compliant with diuretics and is on low dose prednisone. Also not mindful of sodium in diet. We will check a BNP  today as well as a BMP. He has been instructed to improve daily compliance with Lasix and to avoid salt. He was instructed to notify our office if any development of dyspnea. We will have him f/u with APP in 7-10 days for repeat assessment of his edema.    Follow-Up with me in 7-10 days to reassess LEE. F/u w/ Dr. Meda Coffee in 3 months.   Brittainy Ladoris Gene, MHS [facility] 06/02/2017 4:05 PM

## 2017-06-02 NOTE — Patient Instructions (Signed)
Medication Instructions:  Your physician recommends that you continue on your current medications as directed. Please refer to the Current Medication list given to you today.   Labwork: Your physician recommends that you return for lab work today for PRO BNP, BMET   Testing/Procedures: None Ordered   Follow-Up: Your physician recommends that you schedule a follow-up appointment in: Dr. Meda Coffee in 3 months, an APP on team in 7-10 days.    Any Other Special Instructions Will Be Listed Below (If Applicable).  Please make sure you continue to take your lasix and watch your salt intake.    If you need a refill on your cardiac medications before your next appointment, please call your pharmacy.

## 2017-06-03 ENCOUNTER — Ambulatory Visit (INDEPENDENT_AMBULATORY_CARE_PROVIDER_SITE_OTHER): Payer: Medicare Other | Admitting: General Practice

## 2017-06-03 DIAGNOSIS — I4891 Unspecified atrial fibrillation: Secondary | ICD-10-CM | POA: Diagnosis not present

## 2017-06-03 DIAGNOSIS — Z5181 Encounter for therapeutic drug level monitoring: Secondary | ICD-10-CM | POA: Diagnosis not present

## 2017-06-03 DIAGNOSIS — Z8679 Personal history of other diseases of the circulatory system: Secondary | ICD-10-CM | POA: Diagnosis not present

## 2017-06-03 LAB — BASIC METABOLIC PANEL
BUN / CREAT RATIO: 13 (ref 10–24)
BUN: 13 mg/dL (ref 8–27)
CO2: 28 mmol/L (ref 20–29)
CREATININE: 0.98 mg/dL (ref 0.76–1.27)
Calcium: 8.8 mg/dL (ref 8.6–10.2)
Chloride: 98 mmol/L (ref 96–106)
GFR, EST AFRICAN AMERICAN: 79 mL/min/{1.73_m2} (ref >59)
GFR, EST NON AFRICAN AMERICAN: 68 mL/min/{1.73_m2} (ref >59)
Glucose: 107 mg/dL — ABNORMAL HIGH (ref 65–99)
Potassium: 4 mmol/L (ref 3.5–5.2)
Sodium: 138 mmol/L (ref 134–144)

## 2017-06-03 LAB — POCT INR: INR: 3.8

## 2017-06-03 LAB — PRO B NATRIURETIC PEPTIDE: NT-PRO BNP: 3685 pg/mL — AB (ref 0–486)

## 2017-06-03 LAB — PLEASE NOTE

## 2017-06-03 NOTE — Patient Instructions (Signed)
Pre visit review using our clinic review tool, if applicable. No additional management support is needed unless otherwise documented below in the visit note. 

## 2017-06-09 ENCOUNTER — Encounter: Payer: Self-pay | Admitting: Podiatry

## 2017-06-09 ENCOUNTER — Ambulatory Visit (INDEPENDENT_AMBULATORY_CARE_PROVIDER_SITE_OTHER): Payer: Medicare Other | Admitting: Podiatry

## 2017-06-09 ENCOUNTER — Telehealth: Payer: Self-pay | Admitting: *Deleted

## 2017-06-09 DIAGNOSIS — M79676 Pain in unspecified toe(s): Secondary | ICD-10-CM

## 2017-06-09 DIAGNOSIS — B351 Tinea unguium: Secondary | ICD-10-CM | POA: Diagnosis not present

## 2017-06-09 DIAGNOSIS — Q828 Other specified congenital malformations of skin: Secondary | ICD-10-CM

## 2017-06-09 NOTE — Telephone Encounter (Signed)
-----   Message from Consuelo Pandy, [place] sent at 06/03/2017  1:41 PM EDT ----- His BNP is markedly elevated. His lower extremity swelling is due to acute heart failure. He needs to improve compliance with lasix daily to avoid further progression/ development of breathing difficulties and to reduce risk for hospitalization. Renal function and K are both stable. Continue Lasix 40 mg daily. Keep plans for f/u with APP in 7-10 days. He will need f/u BNP and BMP. Avoid salt. Elevate legs when resting at home to help with swelling.

## 2017-06-09 NOTE — Progress Notes (Signed)
Patient ID: [name], male   DOB: 12-Sep-1925, 83 y.o.   MRN: [id number] HPI  Complaint:  Visit Type: Patient returns to my office for continued preventative foot care services. Complaint: Patient states" my nails have grown long and thick and become painful to walk and wear shoes" . He presents for preventative foot care services. No changes to ROS  Podiatric Exam: Vascular: dorsalis pedis and posterior tibial pulses are negative. Capillary return is immediate. Temperature gradient is negative. Skin turgor WNL,   Sensorium: Normal Semmes Weinstein monofilament test. Normal tactile sensation bilaterally.  Nail Exam: Pt has thick disfigured discolored nails with subungual debris noted bilateral entire nail hallux through fifth toenails Ulcer Exam: There is no evidence of ulcer or pre-ulcerative changes or infection. Orthopedic Exam: Muscle tone and strength are WNL. No limitations in general ROM. No crepitus or effusions noted. Foot type and digits show no abnormalities. Bony prominences are unremarkable. Amputation third toe right foot. Skin: No Porokeratosis. No infection or ulcers.  Painful callus sub 1 B/L  Diagnosis:  Tinea unguium, Pain in right toe, pain in left toes, Porokeratosis  Treatment & Plan Procedures and Treatment: Consent by patient was obtained for treatment procedures. The patient understood the discussion of treatment and procedures well. All questions were answered thoroughly reviewed. Debridement of mycotic and hypertrophic toenails, 1 through 5 bilateral and clearing of subungual debris. No ulceration, no infection noted. Debride porokeratosis B/L Return Visit-Office Procedure: Patient instructed to return to the office for a follow up visit 10 weeks for continued evaluation and treatment.  Gardiner Barefoot DPM

## 2017-06-09 NOTE — Telephone Encounter (Signed)
Tried to call pt re: lab results, no answer, no machine.  jw 06/09/17

## 2017-06-10 ENCOUNTER — Other Ambulatory Visit: Payer: Self-pay | Admitting: Cardiology

## 2017-06-10 NOTE — Telephone Encounter (Signed)
Pt has been made aware of his lab results. He is aware that he needed to be seen back this week so we could f/u on his swelling/labs. Pt states that he can't come due to his cleaning lady. Pt advised that we would check with Ellen Henri, PA-C, and get her recommendations and call him back. Pt verbalized understanding.

## 2017-06-10 NOTE — Telephone Encounter (Signed)
-----   Message from Consuelo Pandy, [place] sent at 06/03/2017  1:41 PM EDT ----- His BNP is markedly elevated. His lower extremity swelling is due to acute heart failure. He needs to improve compliance with lasix daily to avoid further progression/ development of breathing difficulties and to reduce risk for hospitalization. Renal function and K are both stable. Continue Lasix 40 mg daily. Keep plans for f/u with APP in 7-10 days. He will need f/u BNP and BMP. Avoid salt. Elevate legs when resting at home to help with swelling.

## 2017-06-11 DIAGNOSIS — R21 Rash and other nonspecific skin eruption: Secondary | ICD-10-CM | POA: Diagnosis not present

## 2017-06-11 DIAGNOSIS — D649 Anemia, unspecified: Secondary | ICD-10-CM | POA: Diagnosis not present

## 2017-06-11 DIAGNOSIS — M0579 Rheumatoid arthritis with rheumatoid factor of multiple sites without organ or systems involvement: Secondary | ICD-10-CM | POA: Diagnosis not present

## 2017-06-11 DIAGNOSIS — M1990 Unspecified osteoarthritis, unspecified site: Secondary | ICD-10-CM | POA: Diagnosis not present

## 2017-06-21 ENCOUNTER — Other Ambulatory Visit: Payer: Self-pay | Admitting: Family Medicine

## 2017-06-22 ENCOUNTER — Ambulatory Visit: Payer: Medicare Other | Admitting: Cardiology

## 2017-06-22 NOTE — Telephone Encounter (Signed)
This is [place] patient

## 2017-06-25 ENCOUNTER — Encounter: Payer: Self-pay | Admitting: Cardiology

## 2017-06-28 NOTE — Progress Notes (Signed)
[facility]  HEMATOLOGY ONCOLOGY PROGRESS NOTE  Date of service: .05/05/2017  Patient Care Team: [name], NP as PCP - General (Family Medicine)  CC: f/u for Anemia  INTERVAL HISTORY:  Patient is here for followup his multifactorial anemia . He notes no evidence of overt bleeding.  His hemoglobin today is stable and improved at 11 up from 10.4 on his last visit and 9.5 prior to that. Continues to be on his oral prednisone and off the methotrexate since November 2017 for his rheumatoid arthritis. Continues to be on Coumadin for his atrial fibrillation.  No unexpected weight loss, night sweats fevers or chills.  REVIEW OF SYSTEMS:    10 Point review of systems of done and is negative except as noted above.  . Past Medical History:  Diagnosis Date  . Angina    "jaw & elbow was the worse"  . Arrhythmia   . Atrial fibrillation ([place]) 03/2011  . Carotid artery occlusion    right total internal artery occlusion  . Coronary artery disease    s/p cardiac cath in 1990s, and cardiolite study  in 2005 showing EF 54%  . Degenerative disc disease    cervical spine  . GERD (gastroesophageal reflux disease)   . Hyperlipidemia   . Hypertension   . Hypothyroidism   . Myocardial infarction ([place]) 1992 and 1994  . Pneumonia ~ 1935  . Rheumatoid arthritis ([place])   . Skin cancer    forehead  . TIA (transient ischemic attack)   . Urgency incontinence     . Past Surgical History:  Procedure Laterality Date  . AMPUTATION Right 02/21/2015   Procedure: Right 3rd Ray Amputation;  Surgeon: Newt Minion, MD;  Location: [facility];  Service: Orthopedics;  Laterality: Right;  Digital block and ankle block with MAC  . CARDIAC CATHETERIZATION  1990s  . CATARACT EXTRACTION W/ INTRAOCULAR LENS  IMPLANT, BILATERAL    . SKIN CANCER EXCISION  12/2011   forehead    . Social History  Substance Use Topics  . Smoking status: Former Smoker    Packs/day: 1.00    Years: 40.00    Types: Cigarettes    Quit  date: 09/12/1989  . Smokeless tobacco: Former Systems developer    Quit date: 11/26/1990     Comment: quit around 1992  . Alcohol use No     Comment: former, quit drinking in 1992    ALLERGIES:  is allergic to levofloxacin and klor-con [potassium chloride er].  MEDICATIONS:  Current Outpatient Prescriptions  Medication Sig Dispense Refill  . aspirin 81 MG tablet Take 81 mg by mouth at bedtime.     Marland Kitchen atorvastatin (LIPITOR) 10 MG tablet TAKE 1 TABLET AT BEDTIME 90 tablet 1  . Calcium-Vitamin D (CALTRATE 600 PLUS-VIT D PO) Take 1 tablet by mouth at bedtime.     . digoxin (LANOXIN) 0.125 MG tablet Take 1 tablet (125 mcg total) by mouth daily. 90 tablet 3  . Fish Oil-Krill Oil (KRILL OIL PLUS PO) Take 1 tablet by mouth daily. Krill Oil-Omega 3- DHA-EPA 300-90 (81-85) mg    . folic acid (FOLVITE) 1 MG tablet     . furosemide (LASIX) 40 MG tablet Take 1 tablet (40 mg total) by mouth daily. 90 tablet 1  . levothyroxine (SYNTHROID, LEVOTHROID) 100 MCG tablet TAKE 1 TABLET DAILY (OFFICE VISIT FOR MORE REFILLS) 30 tablet 0  . loperamide (IMODIUM) 2 MG capsule Take 1 capsule (2 mg total) by mouth as needed for diarrhea or loose stools (  max 8 mg daily). 30 capsule 0  . Misc Natural Products (PROSTATE HEALTH PO) Take 2 tablets by mouth daily. 160-100-100    . Multiple Vitamins-Minerals (MEGA MULTI MEN PO) Take 1 tablet by mouth daily. 200-175-250 mcg    . Multiple Vitamins-Minerals (OCUVITE PO) Take 1 tablet by mouth daily.    . nitroGLYCERIN (NITROSTAT) 0.4 MG SL tablet Place 1 tablet (0.4 mg total) under the tongue every 5 (five) minutes as needed for chest pain. 25 tablet 0  . Pediatric Multivitamins-Fl (MULTIPLE VITAMINS/FLUORIDE) 1 MG CHEW Chew 1 tablet by mouth daily.    . predniSONE (DELTASONE) 10 MG tablet     . spironolactone (ALDACTONE) 25 MG tablet Take 25 mg by mouth daily.    . TOPROL XL 50 MG 24 hr tablet TAKE 1 TABLET DAILY WITH OR IMMEDIATELY FOLLOWING A MEAL 90 tablet 3  . traMADol (ULTRAM) 50 MG  tablet Take 1 tablet (50 mg total) by mouth every 6 (six) hours as needed for moderate pain. Take 1-2 tablets by mouth daily as needed for arthritis 30 tablet 0  . warfarin (COUMADIN) 5 MG tablet TAKE AS DIRECTED BY ANTICOAGULATION CLINIC 120 tablet 1   No current facility-administered medications for this visit.     PHYSICAL EXAMINATION: ECOG PERFORMANCE STATUS: 1 - Symptomatic but completely ambulatory  . Vitals:   05/05/17 1508  BP: (!) 116/55  Pulse: 65  Resp: 17  Temp: 98.8 F (37.1 C)    Filed Weights   05/05/17 1508  Weight: 186 lb 9.6 oz (84.6 kg)   .Body mass index is 25.31 kg/m.  GENERAL:alert, in no acute distress and comfortable SKIN: no acute rashes, no significant lesions EYES: conjunctiva are pink and non-injected, sclera anicteric OROPHARYNX: MMM, no exudates, no oropharyngeal erythema or ulceration NECK: supple, no JVD LYMPH:  no palpable lymphadenopathy in the cervical, axillary or inguinal regions LUNGS: clear to auscultation b/l with normal respiratory effort HEART: regular rate & rhythm ABDOMEN:  normoactive bowel sounds , non tender, not distended.No palpable hepatosplenomegaly Extremity: no pedal edema PSYCH: alert & oriented x 3 with fluent speech NEURO: no focal motor/sensory deficits  LABORATORY DATA:   I have reviewed the data as listed  . CBC Latest Ref Rng & Units 05/05/2017 01/27/2017 12/08/2016  WBC 4.0 - 10.3 10e3/uL 6.0 9.2 9.2  Hemoglobin 13.0 - 17.1 g/dL 11.0(L) 10.4(L) 9.5(L)  Hematocrit 38.4 - 49.9 % 33.7(L) 33.2(L) 30.6(L)  Platelets 140 - 400 10e3/uL 201 211 240   . CBC    Component Value Date/Time   WBC 6.0 05/05/2017 1437   WBC 7.5 01/07/2016 1402   RBC 3.48 (L) 05/05/2017 1437   RBC 3.42 (L) 01/07/2016 1402   HGB 11.0 (L) 05/05/2017 1437   HCT 33.7 (L) 05/05/2017 1437   PLT 201 05/05/2017 1437   MCV 96.8 05/05/2017 1437   MCH 31.6 05/05/2017 1437   MCH 29.5 01/07/2016 1402   MCHC 32.6 05/05/2017 1437   MCHC 32.5  01/07/2016 1402   RDW 15.3 (H) 05/05/2017 1437   LYMPHSABS 1.0 05/05/2017 1437   MONOABS 0.4 05/05/2017 1437   EOSABS 0.4 05/05/2017 1437   BASOSABS 0.0 05/05/2017 1437    . CMP Latest Ref Rng & Units 06/02/2017 05/05/2017 12/08/2016  Glucose 65 - 99 mg/dL 107(H) 88 -  BUN 8 - 27 mg/dL 13 12.4 -  Creatinine 0.76 - 1.27 mg/dL 0.98 1.1 -  Sodium 134 - 144 mmol/L 138 139 -  Potassium 3.5 - 5.2 mmol/L 4.0  3.9 -  Chloride 96 - 106 mmol/L 98 - -  CO2 20 - 29 mmol/L 28 32(H) -  Calcium 8.6 - 10.2 mg/dL 8.8 9.5 -  Total Protein 6.4 - 8.3 g/dL - 6.2(L) 6.1  Total Bilirubin 0.20 - 1.20 mg/dL - 0.75 -  Alkaline Phos 40 - 150 U/L - 62 -  AST 5 - 34 U/L - 45(H) -  ALT 0 - 55 U/L - 31 -   . Lab Results  Component Value Date   IRON 54 05/05/2017   TIBC 291 05/05/2017   IRONPCTSAT 18 (L) 05/05/2017   (Iron and TIBC)  Lab Results  Component Value Date   FERRITIN 74 05/05/2017   . Lab Results  Component Value Date   LDH 210 12/08/2016   Component     Latest Ref Rng & Units 12/08/2016 12/08/2016         1:50 PM  1:50 PM  Interpretation         Comment:         Specimen:         Submitted Dx:         Viability:         Cell Population         Granulocytes:         Monocytes:         Antibodies Performed:         Director Review         Total Protein     6.0 - 8.5 g/dL  6.1  Albumin     2.9 - 4.4 g/dL  3.7  Alpha 1     0.0 - 0.4 g/dL  0.2  Alpha 2     0.4 - 1.0 g/dL  0.6  Beta     0.7 - 1.3 g/dL  0.9  Gamma Globulin     0.4 - 1.8 g/dL  0.7  M-SPIKE, %     Not Observed g/dL  Not Observed  Globulin, Total     2.2 - 3.9 g/dL  2.4  A/G Ratio     0.7 - 1.7  1.5  Please Note:       Comment  Interpretation(See Below)       Comment  HEMOGLOBIN F QUANTITATION     0.0 - 2.0 %  0.0  Hgb A     96.4 - 98.8 %  98.0  HGB S     0.0 %  0.0  HGB C     0.0 %  0.0  HEMOGLOBIN A2 QUANTITATION     1.8 - 3.2 %  2.0  HGB VARIANT     0.0 %  0.0  HGB INTERPRETATION        Comment  Erythropoietin     2.6 - 18.5 mIU/mL  42.6 (H)  Folate     >3.0 ng/mL  >20.0  Vitamin B12     232 - 1245 pg/mL  1,185  Haptoglobin     34 - 200 mg/dL  61  LDH     125 - 245 U/L 210   Methylmalonic Acid, Serum     0 - 378 nmol/L  310    RADIOGRAPHIC STUDIES: I have personally reviewed the radiological images as listed and agreed with the findings in the report. No results found.  ASSESSMENT & PLAN:   83 yo with   1) Multifactorial Normocytic Anemia. This appears to be primarily related to anemia of chronic inflammation related to  his rheumatoid arthritis along with perhaps some element of functional iron deficiency. It was also partially related to his methotrexate which he is off of since November 2017. Hemoglobin is improved to 11. Ferritin is trending downwards and suggests some ongoing slow GI losses.   Cannot rule out an element of low level MDS. Plan -No indication for PRBC transfusion at this time. -We will offer him additional IV injectafer since his ferritin is now down to less than 100. -Optimize treatment for his rheumatoid arthritis. -Monitor for slow blood loss with ongoing anticoagulation with Coumadin -Primary care physician to determine need for GI workup for evaluation of iron deficiency and to evaluate risk versus benefit of ongoing anticoagulation.   We'll set up the patient for additional IV iron if agreeable Return to care with Dr. Irene Limbo in 6 months with repeat labs  I spent 20 minutes counseling the patient face to face. The total time spent in the appointment was 25 minutes and more than 50% was on counseling and direct patient cares.    Sullivan Lone MD [place] AAHIVMS [facility] Hematology/Oncology Physician [facility]  (Office):       [phone] (Work cell):  [phone] (Fax):           [phone]

## 2017-06-29 ENCOUNTER — Telehealth: Payer: Self-pay | Admitting: *Deleted

## 2017-06-29 ENCOUNTER — Ambulatory Visit (INDEPENDENT_AMBULATORY_CARE_PROVIDER_SITE_OTHER): Payer: Medicare Other | Admitting: General Practice

## 2017-06-29 ENCOUNTER — Telehealth: Payer: Self-pay | Admitting: Adult Health

## 2017-06-29 ENCOUNTER — Telehealth: Payer: Self-pay | Admitting: Hematology

## 2017-06-29 DIAGNOSIS — Z5181 Encounter for therapeutic drug level monitoring: Secondary | ICD-10-CM

## 2017-06-29 DIAGNOSIS — I4891 Unspecified atrial fibrillation: Secondary | ICD-10-CM

## 2017-06-29 LAB — POCT INR: INR: 1.9

## 2017-06-29 NOTE — Telephone Encounter (Signed)
Called and spoke with patients daughter and scheduled iron infusions.

## 2017-06-29 NOTE — Progress Notes (Signed)
I agree with this plan.

## 2017-06-29 NOTE — Telephone Encounter (Signed)
Patient wants to know if he is caught up on his shots.

## 2017-06-29 NOTE — Telephone Encounter (Signed)
Per Dr. Irene Limbo, called and sw pt's daughter [name] regarding low ferritin levels.  Advised pt should get 2 weekly IV doses of iron.  Daughter agreeable/verbalized understanding.  Scheduling message sent.

## 2017-06-29 NOTE — Patient Instructions (Signed)
Pre visit review using our clinic review tool, if applicable. No additional management support is needed unless otherwise documented below in the visit note. 

## 2017-06-30 NOTE — Telephone Encounter (Signed)
Spoke to the pt and informed him that he is caught up on injections except for Shingrix.  Advised pt to go to local pharmacy for that injection.  Pt agreed.

## 2017-07-01 ENCOUNTER — Ambulatory Visit: Payer: Medicare Other

## 2017-07-01 DIAGNOSIS — L570 Actinic keratosis: Secondary | ICD-10-CM | POA: Diagnosis not present

## 2017-07-01 DIAGNOSIS — X32XXXD Exposure to sunlight, subsequent encounter: Secondary | ICD-10-CM | POA: Diagnosis not present

## 2017-07-14 ENCOUNTER — Ambulatory Visit (HOSPITAL_BASED_OUTPATIENT_CLINIC_OR_DEPARTMENT_OTHER): Payer: Medicare Other

## 2017-07-14 VITALS — BP 153/73 | HR 63 | Temp 98.4°F | Resp 18

## 2017-07-14 DIAGNOSIS — D509 Iron deficiency anemia, unspecified: Secondary | ICD-10-CM

## 2017-07-14 DIAGNOSIS — D508 Other iron deficiency anemias: Secondary | ICD-10-CM

## 2017-07-14 MED ORDER — FERRIC CARBOXYMALTOSE 750 MG/15ML IV SOLN
750.0000 mg | Freq: Once | INTRAVENOUS | Status: AC
  Administered 2017-07-14: 750 mg via INTRAVENOUS
  Filled 2017-07-14: qty 15

## 2017-07-14 NOTE — Patient Instructions (Addendum)
Ferric carboxymaltose injection What is this medicine? FERRIC CARBOXYMALTOSE (ferr-ik car-box-ee-mol-toes) is an iron complex. Iron is used to make healthy red blood cells, which carry oxygen and nutrients throughout the body. This medicine is used to treat anemia in people with chronic kidney disease or people who cannot take iron by mouth. This medicine may be used for other purposes; ask your health care provider or pharmacist if you have questions. COMMON BRAND NAME(S): Injectafer What should I tell my health care provider before I take this medicine? They need to know if you have any of these conditions: -anemia not caused by low iron levels -high levels of iron in the blood -liver disease -an unusual or allergic reaction to iron, other medicines, foods, dyes, or preservatives -pregnant or trying to get pregnant -breast-feeding How should I use this medicine? This medicine is for infusion into a vein. It is given by a health care professional in a hospital or clinic setting. Talk to your pediatrician regarding the use of this medicine in children. Special care may be needed. Overdosage: If you think you have taken too much of this medicine contact a poison control center or emergency room at once. NOTE: This medicine is only for you. Do not share this medicine with others. What if I miss a dose? It is important not to miss your dose. Call your doctor or health care professional if you are unable to keep an appointment. What may interact with this medicine? Do not take this medicine with any of the following medications: -deferoxamine -dimercaprol -other iron products This medicine may also interact with the following medications: -chloramphenicol -deferasirox This list may not describe all possible interactions. Give your health care provider a list of all the medicines, herbs, non-prescription drugs, or dietary supplements you use. Also tell them if you smoke, drink alcohol, or use  illegal drugs. Some items may interact with your medicine. What should I watch for while using this medicine? Visit your doctor or health care professional regularly. Tell your doctor if your symptoms do not start to get better or if they get worse. You may need blood work done while you are taking this medicine. You may need to follow a special diet. Talk to your doctor. Foods that contain iron include: whole grains/cereals, dried fruits, beans, or peas, leafy green vegetables, and organ meats (liver, kidney). What side effects may I notice from receiving this medicine? Side effects that you should report to your doctor or health care professional as soon as possible: -allergic reactions like skin rash, itching or hives, swelling of the face, lips, or tongue -breathing problems -changes in blood pressure -feeling faint or lightheaded, falls -flushing, sweating, or hot feelings Side effects that usually do not require medical attention (report to your doctor or health care professional if they continue or are bothersome): -changes in taste -constipation -dizziness -headache -nausea -pain, redness, or irritation at site where injected -vomiting This list may not describe all possible side effects. Call your doctor for medical advice about side effects. You may report side effects to FDA at 1-800-FDA-1088. Where should I keep my medicine? This drug is given in a hospital or clinic and will not be stored at home. NOTE: This sheet is a summary. It may not cover all possible information. If you have questions about this medicine, talk to your doctor, pharmacist, or health care provider.  2018 Elsevier/Gold Standard (2015-12-13 11:20:47)  

## 2017-07-15 ENCOUNTER — Telehealth: Payer: Self-pay | Admitting: Hematology

## 2017-07-15 NOTE — Telephone Encounter (Signed)
Spoke to patient's daughter about December appointment.

## 2017-07-21 ENCOUNTER — Ambulatory Visit (HOSPITAL_BASED_OUTPATIENT_CLINIC_OR_DEPARTMENT_OTHER): Payer: Medicare Other

## 2017-07-21 VITALS — BP 128/65 | HR 63 | Temp 98.3°F | Resp 18

## 2017-07-21 DIAGNOSIS — D508 Other iron deficiency anemias: Secondary | ICD-10-CM

## 2017-07-21 MED ORDER — SODIUM CHLORIDE 0.9 % IV SOLN
750.0000 mg | Freq: Once | INTRAVENOUS | Status: AC
  Administered 2017-07-21: 750 mg via INTRAVENOUS
  Filled 2017-07-21: qty 15

## 2017-07-21 NOTE — Patient Instructions (Signed)
Ferric carboxymaltose injection (Injectafor) What is this medicine? FERRIC CARBOXYMALTOSE (ferr-ik car-box-ee-mol-toes) is an iron complex. Iron is used to make healthy red blood cells, which carry oxygen and nutrients throughout the body. This medicine is used to treat anemia in people with chronic kidney disease or people who cannot take iron by mouth. This medicine may be used for other purposes; ask your health care provider or pharmacist if you have questions. COMMON BRAND NAME(S): Injectafer What should I tell my health care provider before I take this medicine? They need to know if you have any of these conditions: -anemia not caused by low iron levels -high levels of iron in the blood -liver disease -an unusual or allergic reaction to iron, other medicines, foods, dyes, or preservatives -pregnant or trying to get pregnant -breast-feeding How should I use this medicine? This medicine is for infusion into a vein. It is given by a health care professional in a hospital or clinic setting. Talk to your pediatrician regarding the use of this medicine in children. Special care may be needed. Overdosage: If you think you have taken too much of this medicine contact a poison control center or emergency room at once. NOTE: This medicine is only for you. Do not share this medicine with others. What if I miss a dose? It is important not to miss your dose. Call your doctor or health care professional if you are unable to keep an appointment. What may interact with this medicine? Do not take this medicine with any of the following medications: -deferoxamine -dimercaprol -other iron products This medicine may also interact with the following medications: -chloramphenicol -deferasirox This list may not describe all possible interactions. Give your health care provider a list of all the medicines, herbs, non-prescription drugs, or dietary supplements you use. Also tell them if you smoke, drink  alcohol, or use illegal drugs. Some items may interact with your medicine. What should I watch for while using this medicine? Visit your doctor or health care professional regularly. Tell your doctor if your symptoms do not start to get better or if they get worse. You may need blood work done while you are taking this medicine. You may need to follow a special diet. Talk to your doctor. Foods that contain iron include: whole grains/cereals, dried fruits, beans, or peas, leafy green vegetables, and organ meats (liver, kidney). What side effects may I notice from receiving this medicine? Side effects that you should report to your doctor or health care professional as soon as possible: -allergic reactions like skin rash, itching or hives, swelling of the face, lips, or tongue -breathing problems -changes in blood pressure -feeling faint or lightheaded, falls -flushing, sweating, or hot feelings Side effects that usually do not require medical attention (report to your doctor or health care professional if they continue or are bothersome): -changes in taste -constipation -dizziness -headache -nausea -pain, redness, or irritation at site where injected -vomiting This list may not describe all possible side effects. Call your doctor for medical advice about side effects. You may report side effects to FDA at 1-800-FDA-1088. Where should I keep my medicine? This drug is given in a hospital or clinic and will not be stored at home. NOTE: This sheet is a summary. It may not cover all possible information. If you have questions about this medicine, talk to your doctor, pharmacist, or health care provider.  2018 Elsevier/Gold Standard (2015-12-13 11:20:47)

## 2017-07-24 ENCOUNTER — Telehealth: Payer: Self-pay | Admitting: Adult Health

## 2017-07-24 NOTE — Telephone Encounter (Signed)
Pt need new Rx for Tramadol 50 MG #180  Pharm:  [facility]

## 2017-07-24 NOTE — Telephone Encounter (Signed)
Tried to reach the pt.  No machine at home number.  Tramadol should be filled by rheumatology for arthritis.  Seen that Tommi Rumps did a referral and should have had a new pt appt with Aryal on 05/13/17.

## 2017-07-29 NOTE — Telephone Encounter (Signed)
Ok to refill for 30 days  

## 2017-07-29 NOTE — Telephone Encounter (Signed)
Called Dr. Teodoro Spray office.  His nurse will call back.

## 2017-07-29 NOTE — Telephone Encounter (Signed)
Spoke to [place] at Dr. Teodoro Spray office.  Dr. Kathlene November does not prescribe controlled substances.  Please advise.  Thanks!!

## 2017-07-30 MED ORDER — TRAMADOL HCL 50 MG PO TABS: ORAL_TABLET | ORAL | 0 refills | Status: DC

## 2017-07-30 NOTE — Telephone Encounter (Signed)
Faxed to the pharmacy.

## 2017-08-03 ENCOUNTER — Ambulatory Visit (INDEPENDENT_AMBULATORY_CARE_PROVIDER_SITE_OTHER): Payer: Medicare Other | Admitting: General Practice

## 2017-08-03 DIAGNOSIS — Z5181 Encounter for therapeutic drug level monitoring: Secondary | ICD-10-CM | POA: Diagnosis not present

## 2017-08-03 DIAGNOSIS — Z23 Encounter for immunization: Secondary | ICD-10-CM | POA: Diagnosis not present

## 2017-08-03 DIAGNOSIS — Z7901 Long term (current) use of anticoagulants: Secondary | ICD-10-CM | POA: Diagnosis not present

## 2017-08-03 DIAGNOSIS — I4891 Unspecified atrial fibrillation: Secondary | ICD-10-CM

## 2017-08-03 LAB — POCT INR: INR: 1.4

## 2017-08-03 NOTE — Patient Instructions (Signed)
Pre visit review using our clinic review tool, if applicable. No additional management support is needed unless otherwise documented below in the visit note. 

## 2017-08-03 NOTE — Progress Notes (Signed)
I agree with this plan.

## 2017-08-06 DIAGNOSIS — M1990 Unspecified osteoarthritis, unspecified site: Secondary | ICD-10-CM | POA: Diagnosis not present

## 2017-08-06 DIAGNOSIS — M0579 Rheumatoid arthritis with rheumatoid factor of multiple sites without organ or systems involvement: Secondary | ICD-10-CM | POA: Diagnosis not present

## 2017-08-06 DIAGNOSIS — R21 Rash and other nonspecific skin eruption: Secondary | ICD-10-CM | POA: Diagnosis not present

## 2017-08-06 DIAGNOSIS — D649 Anemia, unspecified: Secondary | ICD-10-CM | POA: Diagnosis not present

## 2017-08-11 ENCOUNTER — Other Ambulatory Visit: Payer: Self-pay | Admitting: Cardiology

## 2017-08-16 ENCOUNTER — Other Ambulatory Visit: Payer: Self-pay | Admitting: Cardiology

## 2017-08-17 ENCOUNTER — Ambulatory Visit (INDEPENDENT_AMBULATORY_CARE_PROVIDER_SITE_OTHER): Payer: Medicare Other | Admitting: General Practice

## 2017-08-17 DIAGNOSIS — Z7901 Long term (current) use of anticoagulants: Secondary | ICD-10-CM

## 2017-08-17 DIAGNOSIS — I4891 Unspecified atrial fibrillation: Secondary | ICD-10-CM

## 2017-08-17 LAB — POCT INR: INR: 1.5

## 2017-08-17 NOTE — Progress Notes (Signed)
I agree with this plan.

## 2017-08-17 NOTE — Patient Instructions (Signed)
Pre visit review using our clinic review tool, if applicable. No additional management support is needed unless otherwise documented below in the visit note. 

## 2017-08-18 ENCOUNTER — Ambulatory Visit (INDEPENDENT_AMBULATORY_CARE_PROVIDER_SITE_OTHER): Payer: Medicare Other | Admitting: Podiatry

## 2017-08-18 ENCOUNTER — Encounter: Payer: Self-pay | Admitting: Podiatry

## 2017-08-18 DIAGNOSIS — M79676 Pain in unspecified toe(s): Secondary | ICD-10-CM

## 2017-08-18 DIAGNOSIS — B351 Tinea unguium: Secondary | ICD-10-CM

## 2017-08-18 DIAGNOSIS — Q828 Other specified congenital malformations of skin: Secondary | ICD-10-CM

## 2017-08-18 NOTE — Progress Notes (Signed)
Patient ID: DAYTEN JUBA, male   DOB: [birth year], 83 y.o.   MRN: [id number] HPI  Complaint:  Visit Type: Patient returns to my office for continued preventative foot care services. Complaint: Patient states" my nails have grown long and thick and become painful to walk and wear shoes" . He presents for preventative foot care services. No changes to ROS  Podiatric Exam: Vascular: dorsalis pedis and posterior tibial pulses are negative. Capillary return is immediate. Temperature gradient is negative. Skin turgor WNL,   Sensorium: Normal Semmes Weinstein monofilament test. Normal tactile sensation bilaterally.  Nail Exam: Pt has thick disfigured discolored nails with subungual debris noted bilateral entire nail hallux through fifth toenails Ulcer Exam: There is no evidence of ulcer or pre-ulcerative changes or infection. Orthopedic Exam: Muscle tone and strength are WNL. No limitations in general ROM. No crepitus or effusions noted. Foot type and digits show no abnormalities. Bony prominences are unremarkable. Amputation third toe right foot. Skin: No Porokeratosis. No infection or ulcers.  Painful callus sub 1/2 B/L  Diagnosis:  Tinea unguium, Pain in right toe, pain in left toes, Porokeratosis  Treatment & Plan Procedures and Treatment: Consent by patient was obtained for treatment procedures. The patient understood the discussion of treatment and procedures well. All questions were answered thoroughly reviewed. Debridement of mycotic and hypertrophic toenails, 1 through 5 bilateral and clearing of subungual debris. No ulceration, no infection noted. Debride porokeratosis B/L Return Visit-Office Procedure: Patient instructed to return to the office for a follow up visit 10 weeks for continued evaluation and treatment.  Gardiner Barefoot DPM

## 2017-08-31 ENCOUNTER — Ambulatory Visit (INDEPENDENT_AMBULATORY_CARE_PROVIDER_SITE_OTHER): Payer: Medicare Other | Admitting: General Practice

## 2017-08-31 DIAGNOSIS — I4891 Unspecified atrial fibrillation: Secondary | ICD-10-CM | POA: Diagnosis not present

## 2017-08-31 DIAGNOSIS — Z7901 Long term (current) use of anticoagulants: Secondary | ICD-10-CM

## 2017-08-31 LAB — POCT INR: INR: 2.7

## 2017-08-31 NOTE — Progress Notes (Signed)
I agree with this plan.

## 2017-08-31 NOTE — Patient Instructions (Signed)
Pre visit review using our clinic review tool, if applicable. No additional management support is needed unless otherwise documented below in the visit note. 

## 2017-09-07 ENCOUNTER — Encounter (HOSPITAL_COMMUNITY): Payer: Self-pay | Admitting: Emergency Medicine

## 2017-09-07 ENCOUNTER — Ambulatory Visit (INDEPENDENT_AMBULATORY_CARE_PROVIDER_SITE_OTHER): Payer: Medicare Other | Admitting: Cardiology

## 2017-09-07 ENCOUNTER — Ambulatory Visit (INDEPENDENT_AMBULATORY_CARE_PROVIDER_SITE_OTHER): Payer: Medicare Other

## 2017-09-07 ENCOUNTER — Ambulatory Visit (HOSPITAL_COMMUNITY)
Admission: EM | Admit: 2017-09-07 | Discharge: 2017-09-07 | Disposition: A | Discharge status: Home / Self Care | Payer: Medicare Other | Attending: Emergency Medicine | Admitting: Emergency Medicine

## 2017-09-07 DIAGNOSIS — I5033 Acute on chronic diastolic (congestive) heart failure: Secondary | ICD-10-CM | POA: Diagnosis not present

## 2017-09-07 DIAGNOSIS — S51012A Laceration without foreign body of left elbow, initial encounter: Secondary | ICD-10-CM

## 2017-09-07 DIAGNOSIS — W19XXXA Unspecified fall, initial encounter: Secondary | ICD-10-CM

## 2017-09-07 DIAGNOSIS — I2510 Atherosclerotic heart disease of native coronary artery without angina pectoris: Secondary | ICD-10-CM

## 2017-09-07 DIAGNOSIS — I5022 Chronic systolic (congestive) heart failure: Secondary | ICD-10-CM

## 2017-09-07 DIAGNOSIS — M25512 Pain in left shoulder: Secondary | ICD-10-CM | POA: Diagnosis not present

## 2017-09-07 DIAGNOSIS — I350 Nonrheumatic aortic (valve) stenosis: Secondary | ICD-10-CM

## 2017-09-07 DIAGNOSIS — I482 Chronic atrial fibrillation, unspecified: Secondary | ICD-10-CM

## 2017-09-07 DIAGNOSIS — S4992XA Unspecified injury of left shoulder and upper arm, initial encounter: Secondary | ICD-10-CM | POA: Diagnosis not present

## 2017-09-07 DIAGNOSIS — M25522 Pain in left elbow: Secondary | ICD-10-CM | POA: Diagnosis not present

## 2017-09-07 DIAGNOSIS — E785 Hyperlipidemia, unspecified: Secondary | ICD-10-CM | POA: Diagnosis not present

## 2017-09-07 DIAGNOSIS — I6523 Occlusion and stenosis of bilateral carotid arteries: Secondary | ICD-10-CM | POA: Diagnosis not present

## 2017-09-07 DIAGNOSIS — S40012A Contusion of left shoulder, initial encounter: Secondary | ICD-10-CM

## 2017-09-07 DIAGNOSIS — S59902A Unspecified injury of left elbow, initial encounter: Secondary | ICD-10-CM | POA: Diagnosis not present

## 2017-09-07 MED ORDER — METOPROLOL SUCCINATE ER 25 MG PO TB24: 25.0000 mg | ORAL_TABLET | Freq: Every day | ORAL | 3 refills | Status: DC

## 2017-09-07 NOTE — Patient Instructions (Addendum)
Medication Instructions:  1) DECREASE Metoprolol Succinate to 25mg  once daily.    Labwork: BMET and Pro BNP today    Testing/Procedures: Your physician has requested that you have an echocardiogram. Echocardiography is a painless test that uses sound waves to create images of your heart. It provides your doctor with information about the size and shape of your heart and how well your heart's chambers and valves are working. This procedure takes approximately one hour. There are no restrictions for this procedure.    Follow-Up: Your physician recommends that you schedule a follow-up appointment in: 3 weeks with Lyda Jester, PA-C or Dayna Dunn PA-C.   .     If you need a refill on your cardiac medications before your next appointment, please call your pharmacy.

## 2017-09-07 NOTE — ED Triage Notes (Signed)
Pt sts trip and fall today with left shoulder and elbow pain; pt did not hit his head but does take coumadin

## 2017-09-07 NOTE — ED Provider Notes (Signed)
[facility]    CSN: [id number] Arrival date & time: 09/07/17  1654     History   Chief Complaint Chief Complaint  Patient presents with  . Fall    HPI [name] [name] is a 83 y.o. male.   Fully awake and alert in well-preserved 83 year old male states he was walking and fell onto cement just before 3 PM today. He fell onto his left elbow and right shoulder. He states he originally had some shoulder pain and an abrasion to the left elbow.Ice had been applied to the left shoulder and is feeling better. He denies injury to the head, neck, back, chest, abdomen or other extremities. He is fully awake and alert, recalls the events of the day. No evidence of confusion or disorientation. His speech is clear and lucid and goal oriented. He is able to answer all questions appropriately.      Past Medical History:  Diagnosis Date  . Angina    "jaw & elbow was the worse"  . Arrhythmia   . Atrial fibrillation ([place]) 03/2011  . Carotid artery occlusion    right total internal artery occlusion  . Coronary artery disease    s/p cardiac cath in 1990s, and cardiolite study  in 2005 showing EF 54%  . Degenerative disc disease    cervical spine  . GERD (gastroesophageal reflux disease)   . Hyperlipidemia   . Hypertension   . Hypothyroidism   . Myocardial infarction ([place]) 1992 and 1994  . Pneumonia ~ 1935  . Rheumatoid arthritis ([place])   . Skin cancer    forehead  . TIA (transient ischemic attack)   . Urgency incontinence     Patient Active Problem List   Diagnosis Date Noted  . Iron deficiency anemia 12/16/2016  . Aortic stenosis 04/10/2015  . Protein-calorie malnutrition, severe ([place]) 02/23/2015  . Osteomyelitis ([place]) 02/17/2015  . Chronic anticoagulation 02/17/2015  . Bilateral carotid artery disease ([place]) 09/12/2014  . Chronic systolic CHF (congestive heart failure), NYHA class 2 ([place]) 07/10/2014  . Encounter for therapeutic drug monitoring 01/02/2014  .  Hypothyroidism 07/04/2013  . Asthma exacerbation, allergic 09/13/2012  . Occlusion and stenosis of carotid artery without mention of cerebral infarction 05/18/2012  . Adrenal insufficiency ([place]) 02/08/2012  . Thrombocytopenia ([place]) 02/07/2012  . 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 Anemia 11/26/2011  . Long term (current) use of anticoagulants 02/24/2011  . Atrial fibrillation ([place]) 01/12/2009  . ALLERGIC RHINITIS 02/24/2008  . HLD (hyperlipidemia) 06/15/2007  . Benign essential HTN 06/15/2007  . MYOCARDIAL INFARCTION, HX OF 06/15/2007  . Coronary atherosclerosis 06/15/2007  . TRANSIENT ISCHEMIC ATTACK, HX OF 06/15/2007    Past Surgical History:  Procedure Laterality Date  . AMPUTATION Right 02/21/2015   Procedure: Right 3rd Ray Amputation;  Surgeon: Newt Minion, MD;  Location: [place];  Service: Orthopedics;  Laterality: Right;  Digital block and ankle block with MAC  . CARDIAC CATHETERIZATION  1990s  . CATARACT EXTRACTION W/ INTRAOCULAR LENS  IMPLANT, BILATERAL    . SKIN CANCER EXCISION  12/2011   forehead       Home Medications    Prior to Admission medications   Medication Sig Start Date End Date Taking? Authorizing Provider  aspirin 81 MG tablet Take 81 mg by mouth at bedtime.     [provider]  atorvastatin (LIPITOR) 10 MG tablet TAKE 1 TABLET AT BEDTIME 08/12/17   Dorothy Spark, MD  Calcium-Vitamin D (CALTRATE 600 PLUS-VIT D PO) Take  1 tablet by mouth at bedtime.     [provider]  digoxin (LANOXIN) 0.125 MG tablet TAKE 1 TABLET DAILY 08/12/17   Dorothy Spark, MD  Fish Oil-Krill Oil (KRILL OIL PLUS PO) Take 1 tablet by mouth daily. Krill Oil-Omega 3- DHA-EPA 300-90 (27-45) mg    [provider]  folic acid (FOLVITE) 1 MG tablet  03/06/16   [provider]  furosemide (LASIX) 40 MG tablet Take 1 tablet (40 mg total) by mouth daily. 08/18/17   Dorothy Spark, MD  levothyroxine (SYNTHROID, LEVOTHROID) 100 MCG tablet TAKE 1  TABLET DAILY (OFFICE VISIT FOR MORE REFILLS) 01/18/16   Dorena Cookey, MD  loperamide (IMODIUM) 2 MG capsule Take 1 capsule (2 mg total) by mouth as needed for diarrhea or loose stools (max 8 mg daily). 02/23/15   Orson Eva, MD  metoprolol succinate (TOPROL-XL) 25 MG 24 hr tablet Take 1 tablet (25 mg total) by mouth daily. Take with or immediately following a meal. 09/07/17 09/05/18  Dorothy Spark, MD  Misc Natural Products (PROSTATE HEALTH PO) Take 2 tablets by mouth daily. 160-100-100    [provider]  Multiple Vitamins-Minerals (MEGA MULTI MEN PO) Take 1 tablet by mouth daily. 200-175-250 mcg    [provider]  Multiple Vitamins-Minerals (OCUVITE PO) Take 1 tablet by mouth daily.    [provider]  nitroGLYCERIN (NITROSTAT) 0.4 MG SL tablet Place 1 tablet (0.4 mg total) under the tongue every 5 (five) minutes as needed for chest pain. 05/22/17   Burchette, Alinda Sierras, MD  Pediatric Multivitamins-Fl (MULTIPLE VITAMINS/FLUORIDE) 1 MG CHEW Chew 1 tablet by mouth daily.    [provider]  predniSONE (DELTASONE) 10 MG tablet  10/14/16   [provider]  spironolactone (ALDACTONE) 25 MG tablet Take 25 mg by mouth daily.    [provider]  traMADol Veatrice Bourbon) 50 MG tablet Take 1-2 tablets by mouth daily as needed for arthritis 07/30/17   [name], NP  traMADol (ULTRAM) 50 MG tablet Take 1-2 tablets by mouth daily as needed for arthritis 07/30/17   [name], NP  traMADol Veatrice Bourbon) 50 MG tablet Take 1-2 tablets by mouth daily as needed for arthritis 07/30/17   [name], NP  warfarin (COUMADIN) 5 MG tablet TAKE AS DIRECTED BY ANTICOAGULATION CLINIC 06/22/17   Dorena Cookey, MD    Family History Family History  Problem Relation Age of Onset  . Heart disease Mother   . Malignant hyperthermia Mother   . Hypertension Mother   . Heart disease Father   . Malignant hyperthermia Father   . Hyperlipidemia Father   . Hypertension Father     . Heart disease Sister   . Hypertension Sister   . Heart disease Brother     Social History Social History  Substance Use Topics  . Smoking status: Former Smoker    Packs/day: 1.00    Years: 40.00    Types: Cigarettes    Quit date: 09/12/1989  . Smokeless tobacco: Former Systems developer    Quit date: 11/26/1990     Comment: quit around 1992  . Alcohol use No     Comment: former, quit drinking in 1992     Allergies   Levofloxacin and Klor-con [potassium chloride er]   Review of Systems Review of Systems  Constitutional: Negative.   HENT: Negative.   Eyes: Negative.        He is wearing glasses and was wearing glasses during the fall  he states that he did not injure his head or face and did not damage his eyeglasses.  Respiratory: Negative.   Gastrointestinal: Negative.   Genitourinary: Negative.   Musculoskeletal:       As per HPI. Originally pain to the left shoulder but not now.  Skin: Positive for wound.       Mall skin tear to the left elbow.  Neurological: Negative for dizziness, weakness, numbness and headaches.  Hematological: Bruises/bleeds easily.  Psychiatric/Behavioral: Negative.   All other systems reviewed and are negative.    Physical Exam Triage Vital Signs ED Triage Vitals [09/07/17 1735]  Enc Vitals Group     BP (!) 128/55     Pulse Rate (!) 53     Resp 18     Temp 98.1 F (36.7 C)     Temp Source Oral     SpO2 100 %     Weight      Height      Head Circumference      Peak Flow      Pain Score      Pain Loc      Pain Edu?      Excl. in [place]?    No data found.   Updated Vital Signs BP (!) 128/55 (BP Location: Right Arm)   Pulse (!) 53   Temp 98.1 F (36.7 C) (Oral)   Resp 18   SpO2 100%   Visual Acuity Right Eye Distance:   Left Eye Distance:   Bilateral Distance:    Right Eye Near:   Left Eye Near:    Bilateral Near:     Physical Exam  Constitutional: He is oriented to person, place, and time. He appears well-developed and  well-nourished.  HENT:  Head: Normocephalic and atraumatic.  Eyes: EOM are normal. Left eye exhibits no discharge.  Neck: Normal range of motion. Neck supple.  No spinal tenderness. No deformity, swelling or step-off deformity. Demonstrates full range of motion of the neck without pain. Complains of minor soreness to the left side of the neck but no tenderness. No apparent injury.  Cardiovascular: Normal rate.   Pulmonary/Chest: Effort normal.  Musculoskeletal: Normal range of motion. He exhibits no edema or deformity.  No asymmetry, swelling or deformity to the left shoulder. No tenderness to the clavicle glenoid, humerus, scapula. No bony tenderness to the left upper extremity or shoulder. Demonstrates full range of motion of the shoulder including internal and on rotation, abduction and abduction. Demonstrates full pronation and supination of the elbow flexion and extension completely intact. No bony tenderness to the elbow.  Neurological: He is alert and oriented to person, place, and time. No cranial nerve deficit.  Skin: Skin is warm and dry.  Psychiatric: He has a normal mood and affect.     UC Treatments / Results  Labs (all labs ordered are listed, but only abnormal results are displayed) Labs Reviewed - No data to display  EKG  EKG Interpretation None       Radiology Dg Elbow Complete Left (3+view)  Result Date: 09/07/2017 CLINICAL DATA:  Fall with elbow pain EXAM: LEFT ELBOW - COMPLETE 3+ VIEW COMPARISON:  None. FINDINGS: There is no evidence of fracture, dislocation, or joint effusion. There is no evidence of arthropathy or other focal bone abnormality. Soft tissues are unremarkable. IMPRESSION: No acute fracture or dislocation of the left elbow. Electronically Signed   By: Ulyses Jarred M.D.   On: 09/07/2017 19:14   Dg Shoulder Left  Result Date: 09/07/2017 CLINICAL DATA:  Left shoulder pain after falling EXAM: LEFT SHOULDER - 2+ VIEW COMPARISON:  None. FINDINGS:  There is no evidence of fracture or dislocation. There is mild acromioclavicular osteoarthrosis. Soft tissues are unremarkable. IMPRESSION: No acute fracture or dislocation of the left shoulder. Mild AC joint osteoarthrosis. Electronically Signed   By: Ulyses Jarred M.D.   On: 09/07/2017 19:16    Procedures Procedures (including critical care time)  Medications Ordered in UC Medications - No data to display  The abrasion to the left elbow is actually a small skin tear. Provider cleaned with soap and water Applied non-adhesive dressing and bandage. Initial Impression / Assessment and Plan / UC Course  I have reviewed the triage vital signs and the nursing notes.  Pertinent labs & imaging results that were available during my care of the patient were reviewed by me and considered in my medical decision making (see chart for details).    You may apply ice off and on to the left shoulder as needed. Clean the elbow wound with mild soap and water daily. May apply a nonadhesive bandage daily. Watch for any signs of infection such as redness, swelling, pus or abnormal drainage.  Your tetanus vaccination was in 2015     Final Clinical Impressions(s) / UC Diagnoses   Final diagnoses:  Fall, initial encounter  Contusion of left shoulder, initial encounter  Skin tear of left elbow without complication, initial encounter    New Prescriptions New Prescriptions   No medications on file   Tetanus is documented as 2.  Controlled Substance Prescriptions [place] Controlled Substance Registry consulted? Not Applicable   Janne Napoleon, NP 09/07/17 1929

## 2017-09-07 NOTE — Progress Notes (Signed)
09/07/2017 [name] [name]   June 17, 1925  [id number]  Primary Physician Carlisle Cater, Tommi Rumps, NP Primary Cardiologist: Dr. Meda Coffee    Reason for Visit/CC: 3 month f/u for Atrial fibrillation and acute on chronic diastolic HF.    HPI:  [name] [name] is a 83 y.o. male, followed by Dr. Meda Coffee, who presents to clinic today for 6 month f/u. He has a h/o chronic atrial fibrillation, on chronic anticoagulation with Coumadin. Also a h/o CAD noted on remote cath in the 1990s however negative NST in 2015, prior h/o systolic HF with previous EF of 45-50%, however most recent echo in 2017 showed normalization to 60-65%. Also h/o moderate AS on echo in 2017, HLD and carotid artery disease with 40-59% L ICA stenosis and chronic right ICA occlusion by dopplers in 08/2015. This is followed by vascular surgery.   Today in f/u, he reports that he has done well. He denies CP. No dyspnea. Also no dizziness, syncope/ near syncope. He does however have significant bilateral LEE with 2+ bilateral pitting edema up to the level of his knees. He denies orthopnea/PND and exertional dyspnea. He admits that he has not been 100% compliant with his Lasix, due to the inconvinence of frequent urination. He only took 1/2 of a tablet today. Order is written for 40 mg daily. He notes that some days, he does not take it at all. He also appears to be on prednisone. He reports he has been on this for several months, but does not know why.   09/07/2017 - patient is coming for 3 months follow-up for acute on chronic diastolic CHF with significant lower extremity edema. Unfortunately he trips in our waiting room and landed on his left arm with abrasions to his left elbow and pain in his left shoulder. He never lost consciousness and didn't hit his head either. He states that since his Lasix was increased his lower extremity edema has improved, he denies any dizziness and no falls other than today. He denies any chest pain no proximal nocturnal  dyspnea.   No outpatient prescriptions have been marked as taking for the 09/07/17 encounter (Office Visit) with Dorothy Spark, MD.   Allergies  Allergen Reactions  . Levofloxacin Rash    "thought they were sticking swords thru me"  . Klor-Con [Potassium Chloride Er] Diarrhea    DIARRHEA   Past Medical History:  Diagnosis Date  . Angina    "jaw & elbow was the worse"  . Arrhythmia   . Atrial fibrillation ([place]) 03/2011  . Carotid artery occlusion    right total internal artery occlusion  . Coronary artery disease    s/p cardiac cath in 1990s, and cardiolite study  in 2005 showing EF 54%  . Degenerative disc disease    cervical spine  . GERD (gastroesophageal reflux disease)   . Hyperlipidemia   . Hypertension   . Hypothyroidism   . Myocardial infarction ([place]) 1992 and 1994  . Pneumonia ~ 1935  . Rheumatoid arthritis ([place])   . Skin cancer    forehead  . TIA (transient ischemic attack)   . Urgency incontinence    Family History  Problem Relation Age of Onset  . Heart disease Mother   . Malignant hyperthermia Mother   . Hypertension Mother   . Heart disease Father   . Malignant hyperthermia Father   . Hyperlipidemia Father   . Hypertension Father   . Heart disease Sister   . Hypertension Sister   . Heart disease  Brother    Past Surgical History:  Procedure Laterality Date  . AMPUTATION Right 02/21/2015   Procedure: Right 3rd Ray Amputation;  Surgeon: Newt Minion, MD;  Location: [facility];  Service: Orthopedics;  Laterality: Right;  Digital block and ankle block with MAC  . CARDIAC CATHETERIZATION  1990s  . CATARACT EXTRACTION W/ INTRAOCULAR LENS  IMPLANT, BILATERAL    . SKIN CANCER EXCISION  12/2011   forehead   Social History   Social History  . Marital status: Married    Spouse name: N/A  . Number of children: N/A  . Years of education: N/A   Occupational History  . Not on file.   Social History Main Topics  . Smoking status: Former Smoker     Packs/day: 1.00    Years: 40.00    Types: Cigarettes    Quit date: 09/12/1989  . Smokeless tobacco: Former Systems developer    Quit date: 11/26/1990     Comment: quit around 1992  . Alcohol use No     Comment: former, quit drinking in 1992  . Drug use: No  . Sexual activity: Not Currently   Other Topics Concern  . 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  Cardiovascular: negative for chest pain, dyspnea on exertion, edema, orthopnea, palpitations, paroxysmal nocturnal dyspnea or shortness of breath Dermatological: negative for rash Respiratory: negative for cough or wheezing Urologic: negative for hematuria Abdominal: negative for nausea, vomiting, diarrhea, bright red blood per rectum, melena, or hematemesis Neurologic: negative for visual changes, syncope, or dizziness All other systems reviewed and are otherwise negative except as noted above.  Physical Exam:  Blood pressure 110/64, heart rate 51, weight 153 pounds. There were no vitals taken for this visit.  General appearance: alert, cooperative and no distress Neck: no carotid bruit and no JVD Lungs: clear to auscultation bilaterally Heart: irregularly irregular rhythm and regular rate, 5/6 systolic murmur. Extremities: 3+ bilateral LEE up above his knees, abrasion and mild bleeding of his left elbow, mild tenderness of his left shoulder, limited range of motion in his shoulder and elbow. Pulses: 2+ and symmetric Skin: Skin color, texture, turgor normal. No rashes or lesions Neurologic: Grossly normal  EKG was performed today 09/07/2017 and showed atrial fibrillation with slow ventricular response and ventricular rate 51 bpm with left posterior fascicular block, heart rate is now slower EKG  otherwise unchanged from prior - personally reviewed   TTE: 12/2016  - Left ventricle: The cavity size was normal. There was moderate   concentric hypertrophy. Systolic function was normal. The   estimated ejection fraction was in the range of 60% to 65%. Wall   motion was normal; there were no regional wall motion   abnormalities. Left ventricular diastolic function parameters   were normal. - Aortic valve: Trileaflet; severely thickened, severely calcified   leaflets. There was moderate stenosis. There was mild   regurgitation. Peak velocity (S): 328 cm/s. Mean gradient (S): 24   mm Hg. Valve area (VTI): 1.08 cm^2. Valve area (Vmax): 1.2 cm^2.   Valve area (Vmean): 1.02 cm^2. - Aorta: Ascending aortic diameter: 38 mm (S). - Ascending aorta: The ascending aorta was mildly dilated. - Mitral valve: Mobility was not restricted.  Transvalvular velocity   was within the normal range. There was no evidence for stenosis. - Left atrium: The atrium was severely dilated. - Right ventricle: The cavity size was normal. Wall thickness was   normal. Systolic function was normal. - Right atrium: The atrium was mildly dilated. - Tricuspid valve: There was mild regurgitation. - Inferior vena cava: The vessel was dilated. The respirophasic   diameter changes were in the normal range (>= 50%), consistent   with normal central venous pressure.  Impressions:  - Moderate aortic stenosis by valve gradients. Visually, the valve   is heavily calcified and thickened. It is possible that there is   severe aortic stenosis but the maximum gradient was not   identified.    ASSESSMENT AND PLAN:   1. Acute on chronic diastolic CHF - unchanged weight but patient states that lower extremity edema has improved, we will repeat BMP and BNP today and continue the same management with Lasix 40 mg daily and spironolactone 25 mg daily.  2. Fall - no obvious reason to fall, we will repeat his echocardiogram, he had  moderate aortic stenosis on his echocardiogram a year ago. Concern would be severe and stenosis.  The patient is being examined and he has no obvious fracture and unlimited range of motion in his elbow and shoulder, there calling his daughters he most probably doesn't need to go to the ER urgent care and is advised to go if his pain gets worse otherwise will call him tomorrow and check on him. Also his blood pressure and heart rate too low today and will decrease Toprol-XL to 25 mg daily.  3. CAD: noted in chart. Apparent CAD noted by cath in the 1990s, however cath report/ angiographic details not available for review. NST in 2015 was negative for ischemia. He denies chest pain and dyspnea. No ASA given chronic coumadin and anemia.   4. Chronic Afib: Decrease Toprol-XL to 25 mg daily. He is on chronic Coumadin no falls to today.   5. Aortic stenosis -as above  6. Bilateral Carotid Artery Disease: known 40-59% L ICA stenosis and chronic right ICA occlusion by dopplers in 08/2015. This is followed by vascular surgery. He denies symptoms of TIA. He is on comadin and statin therapy.   7. HLD: on statin therapy with Lipitor. Well tolerated.  8. HTN: controlled on current regimen.    Ena Dawley, MD [facility] 09/07/2017 3:47 PM

## 2017-09-07 NOTE — Discharge Instructions (Signed)
You may apply ice off and on to the left shoulder as needed. Clean the elbow wound with mild soap and water daily. May apply a nonadhesive bandage daily. Watch for any signs of infection such as redness, swelling, pus or abnormal drainage.  Your tetanus vaccination was in 2015

## 2017-09-08 ENCOUNTER — Telehealth: Payer: Self-pay

## 2017-09-08 LAB — BASIC METABOLIC PANEL
BUN/Creatinine Ratio: 13 (ref 10–24)
BUN: 12 mg/dL (ref 10–36)
CO2: 27 mmol/L (ref 20–29)
Calcium: 8.9 mg/dL (ref 8.6–10.2)
Chloride: 98 mmol/L (ref 96–106)
Creatinine, Ser: 0.91 mg/dL (ref 0.76–1.27)
GFR calc Af Amer: 85 mL/min/{1.73_m2} (ref >59)
GFR calc non Af Amer: 74 mL/min/{1.73_m2} (ref >59)
Glucose: 96 mg/dL (ref 65–99)
Potassium: 3.9 mmol/L (ref 3.5–5.2)
Sodium: 140 mmol/L (ref 134–144)

## 2017-09-08 LAB — PRO B NATRIURETIC PEPTIDE: NT-Pro BNP: 3589 pg/mL — ABNORMAL HIGH (ref 0–486)

## 2017-09-08 MED ORDER — FUROSEMIDE 40 MG PO TABS: 40.0000 mg | ORAL_TABLET | Freq: Two times a day (BID) | ORAL | 3 refills | Status: DC

## 2017-09-08 MED ORDER — METOPROLOL SUCCINATE ER 25 MG PO TB24
12.5000 mg | ORAL_TABLET | Freq: Every day | ORAL | 3 refills | Status: DC
Start: 2017-09-08 — End: 2018-08-28

## 2017-09-08 NOTE — Telephone Encounter (Signed)
-----   Message from Dorothy Spark, MD sent at 09/08/2017  8:59 AM EDT ----- Still in CHF, please increase lasix to 40 mg po in the morning, and 40 mg po in the afternoon, decrease Toprol XL to 12.5 mg po daily. Please bring him back in 2 weeks with a PA.

## 2017-09-08 NOTE — Telephone Encounter (Signed)
Informed patient of results and verbal understanding expressed.  Instructed patient to INCREASE LASIX to 40 mg BID and to DECREASE TOPROL to 12.5 mg daily.  Rescheduled OV/ECHO to 10/30. Called and confirmed medication changes and appointment times with patient's daughter, [name]. Both were grateful for call and agree with treatment plan.

## 2017-09-17 DIAGNOSIS — M1990 Unspecified osteoarthritis, unspecified site: Secondary | ICD-10-CM | POA: Diagnosis not present

## 2017-09-17 DIAGNOSIS — R748 Abnormal levels of other serum enzymes: Secondary | ICD-10-CM | POA: Diagnosis not present

## 2017-09-17 DIAGNOSIS — M05779 Rheumatoid arthritis with rheumatoid factor of unspecified ankle and foot without organ or systems involvement: Secondary | ICD-10-CM | POA: Diagnosis not present

## 2017-09-17 DIAGNOSIS — R21 Rash and other nonspecific skin eruption: Secondary | ICD-10-CM | POA: Diagnosis not present

## 2017-09-17 DIAGNOSIS — D649 Anemia, unspecified: Secondary | ICD-10-CM | POA: Diagnosis not present

## 2017-09-17 DIAGNOSIS — M0579 Rheumatoid arthritis with rheumatoid factor of multiple sites without organ or systems involvement: Secondary | ICD-10-CM | POA: Diagnosis not present

## 2017-09-22 ENCOUNTER — Ambulatory Visit (INDEPENDENT_AMBULATORY_CARE_PROVIDER_SITE_OTHER): Payer: Medicare Other | Admitting: Physician Assistant

## 2017-09-22 ENCOUNTER — Ambulatory Visit (HOSPITAL_COMMUNITY): Payer: Medicare Other | Attending: Cardiology

## 2017-09-22 ENCOUNTER — Encounter (INDEPENDENT_AMBULATORY_CARE_PROVIDER_SITE_OTHER): Payer: Self-pay

## 2017-09-22 ENCOUNTER — Encounter: Payer: Self-pay | Admitting: Physician Assistant

## 2017-09-22 ENCOUNTER — Other Ambulatory Visit: Payer: Self-pay

## 2017-09-22 VITALS — BP 124/88 | HR 72 | Ht 72.0 in | Wt 164.0 lb

## 2017-09-22 DIAGNOSIS — I2510 Atherosclerotic heart disease of native coronary artery without angina pectoris: Secondary | ICD-10-CM

## 2017-09-22 DIAGNOSIS — I4891 Unspecified atrial fibrillation: Secondary | ICD-10-CM | POA: Diagnosis not present

## 2017-09-22 DIAGNOSIS — E785 Hyperlipidemia, unspecified: Secondary | ICD-10-CM | POA: Diagnosis not present

## 2017-09-22 DIAGNOSIS — I083 Combined rheumatic disorders of mitral, aortic and tricuspid valves: Secondary | ICD-10-CM | POA: Diagnosis not present

## 2017-09-22 DIAGNOSIS — I5023 Acute on chronic systolic (congestive) heart failure: Secondary | ICD-10-CM | POA: Diagnosis not present

## 2017-09-22 DIAGNOSIS — W19XXXD Unspecified fall, subsequent encounter: Secondary | ICD-10-CM

## 2017-09-22 DIAGNOSIS — I6523 Occlusion and stenosis of bilateral carotid arteries: Secondary | ICD-10-CM

## 2017-09-22 DIAGNOSIS — I350 Nonrheumatic aortic (valve) stenosis: Secondary | ICD-10-CM

## 2017-09-22 DIAGNOSIS — I420 Dilated cardiomyopathy: Secondary | ICD-10-CM | POA: Diagnosis not present

## 2017-09-22 DIAGNOSIS — I10 Essential (primary) hypertension: Secondary | ICD-10-CM | POA: Diagnosis not present

## 2017-09-22 DIAGNOSIS — W19XXXA Unspecified fall, initial encounter: Secondary | ICD-10-CM | POA: Insufficient documentation

## 2017-09-22 LAB — ECHOCARDIOGRAM COMPLETE
Height: 72 in
Weight: 2624 oz

## 2017-09-22 NOTE — Patient Instructions (Signed)
Your physician has recommended you make the following change in your medication:  INCREASE FUROSEMIDE TO 40 MG 3 TABS DAILY  FOR 3 DAYS THEN DECREASE TO 2 TABS DAILY    Your physician recommends that you schedule a follow-up appointment in: [place]
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
## 2017-09-28 ENCOUNTER — Ambulatory Visit (INDEPENDENT_AMBULATORY_CARE_PROVIDER_SITE_OTHER): Payer: Medicare Other | Admitting: General Practice

## 2017-09-28 DIAGNOSIS — I4891 Unspecified atrial fibrillation: Secondary | ICD-10-CM

## 2017-09-28 DIAGNOSIS — Z7901 Long term (current) use of anticoagulants: Secondary | ICD-10-CM | POA: Diagnosis not present

## 2017-09-28 LAB — POCT INR: INR: 1.4

## 2017-09-28 NOTE — Progress Notes (Signed)
I agree with this plan.

## 2017-09-28 NOTE — Patient Instructions (Signed)
Pre visit review using our clinic review tool, if applicable. No additional management support is needed unless otherwise documented below in the visit note. 

## 2017-09-29 ENCOUNTER — Ambulatory Visit (HOSPITAL_COMMUNITY)
Admission: RE | Admit: 2017-09-29 | Discharge: 2017-09-29 | Disposition: A | Discharge status: Home / Self Care | Payer: Medicare Other | Source: Ambulatory Visit | Attending: Vascular Surgery | Admitting: Vascular Surgery

## 2017-09-29 ENCOUNTER — Ambulatory Visit (INDEPENDENT_AMBULATORY_CARE_PROVIDER_SITE_OTHER): Payer: Medicare Other | Admitting: Family

## 2017-09-29 ENCOUNTER — Encounter: Payer: Self-pay | Admitting: Family

## 2017-09-29 VITALS — BP 134/68 | Resp 20 | Ht 72.0 in | Wt 164.0 lb

## 2017-09-29 DIAGNOSIS — I6521 Occlusion and stenosis of right carotid artery: Secondary | ICD-10-CM

## 2017-09-29 DIAGNOSIS — I6522 Occlusion and stenosis of left carotid artery: Secondary | ICD-10-CM

## 2017-09-29 DIAGNOSIS — I6523 Occlusion and stenosis of bilateral carotid arteries: Secondary | ICD-10-CM

## 2017-09-29 DIAGNOSIS — I872 Venous insufficiency (chronic) (peripheral): Secondary | ICD-10-CM

## 2017-09-29 LAB — VAS US CAROTID
LEFT ECA DIAS: -7 cm/s
LEFT VERTEBRAL DIAS: 12 cm/s
LICAPDIAS: 44 cm/s
LICAPSYS: 138 cm/s
Left CCA dist dias: 17 cm/s
Left CCA dist sys: 83 cm/s
Left CCA prox dias: 20 cm/s
Left CCA prox sys: 100 cm/s
Left ICA dist dias: -18 cm/s
Left ICA dist sys: -101 cm/s
RIGHT CCA MID DIAS: 2 cm/s
RIGHT ECA DIAS: -7 cm/s
RIGHT VERTEBRAL DIAS: -16 cm/s
Right CCA prox dias: 1 cm/s
Right CCA prox sys: 40 cm/s

## 2017-09-29 NOTE — Patient Instructions (Addendum)
Stroke Prevention Some health problems and behaviors may make it more likely for you to have a stroke. Below are ways to lessen your risk of having a stroke.  Be active for at least 30 minutes on most or all days.  Do not smoke. Try not to be around others who smoke.  Do not drink too much alcohol. ? Do not have more than 2 drinks a day if you are a man. ? Do not have more than 1 drink a day if you are a woman and are not pregnant.  Eat healthy foods, such as fruits and vegetables. If you were put on a specific diet, follow the diet as told.  Keep your cholesterol levels under control through diet and medicines. Look for foods that are low in saturated fat, trans fat, cholesterol, and are high in fiber.  If you have diabetes, follow all diet plans and take your medicine as told.  Ask your doctor if you need treatment to lower your blood pressure. If you have high blood pressure (hypertension), follow all diet plans and take your medicine as told by your doctor.  If you are 18-39 years old, have your blood pressure checked every 3-5 years. If you are age 40 or older, have your blood pressure checked every year.  Keep a healthy weight. Eat foods that are low in calories, salt, saturated fat, trans fat, and cholesterol.  Do not take drugs.  Avoid birth control pills, if this applies. Talk to your doctor about the risks of taking birth control pills.  Talk to your doctor if you have sleep problems (sleep apnea).  Take all medicine as told by your doctor. ? You may be told to take aspirin or blood thinner medicine. Take this medicine as told by your doctor. ? Understand your medicine instructions.  Make sure any other conditions you have are being taken care of.  Get help right away if:  You suddenly lose feeling (you feel numb) or have weakness in your face, arm, or leg.  Your face or eyelid hangs down to one side.  You suddenly feel confused.  You have trouble talking  (aphasia) or understanding what people are saying.  You suddenly have trouble seeing in one or both eyes.  You suddenly have trouble walking.  You are dizzy.  You lose your balance or your movements are clumsy (uncoordinated).  You suddenly have a very bad headache and you do not know the cause.  You have new chest pain.  Your heart feels like it is fluttering or skipping a beat (irregular heartbeat). Do not wait to see if the symptoms above go away. Get help right away. Call your local emergency services (911 in [place] Do not drive yourself to the hospital. This information is not intended to replace advice given to you by your health care provider. Make sure you discuss any questions you have with your health care provider. Document Released: 05/11/2012 Document Revised: 04/17/2016 Document Reviewed: 05/13/2013 Elsevier Interactive Patient Education  2018 [employer]     Chronic Venous Insufficiency Chronic venous insufficiency, also called venous stasis, is a condition that prevents blood from being pumped effectively through the veins in your legs. Blood may no longer be pumped effectively from the legs back to the heart. This condition can range from mild to severe. With proper treatment, you should be able to continue with an active life. What are the causes? Chronic venous insufficiency occurs when the vein walls become stretched, weakened, or   damaged, or when valves within the vein are damaged. Some common causes of this include:  High blood pressure inside the veins (venous hypertension).  Increased blood pressure in the leg veins from long periods of sitting or standing.  A blood clot that blocks blood flow in a vein (deep vein thrombosis, DVT).  Inflammation of a vein (phlebitis) that causes a blood clot to form.  Tumors in the pelvis that cause blood to back up.  What increases the risk? The following factors may make you more likely to develop this  condition:  Having a family history of this condition.  Obesity.  Pregnancy.  Living without enough physical activity or exercise (sedentary lifestyle).  Smoking.  Having a job that requires long periods of standing or sitting in one place.  Being a certain age. Women in their 40s and 50s and men in their 70s are more likely to develop this condition.  What are the signs or symptoms? Symptoms of this condition include:  Veins that are enlarged, bulging, or twisted (varicose veins).  Skin breakdown or ulcers.  Reddened or discolored skin on the front of the leg.  Brown, smooth, tight, and painful skin just above the ankle, usually on the inside of the leg (lipodermatosclerosis).  Swelling.  How is this diagnosed? This condition may be diagnosed based on:  Your medical history.  A physical exam.  Tests, such as: ? A procedure that creates an image of a blood vessel and nearby organs and provides information about blood flow through the blood vessel (duplex ultrasound). ? A procedure that tests blood flow (plethysmography). ? A procedure to look at the veins using X-ray and dye (venogram).  How is this treated? The goals of treatment are to help you return to an active life and to minimize pain or disability. Treatment depends on the severity of your condition, and it may include:  Wearing compression stockings. These can help relieve symptoms and help prevent your condition from getting worse. However, they do not cure the condition.  Sclerotherapy. This is a procedure involving an injection of a material that "dissolves" damaged veins.  Surgery. This may involve: ? Removing a diseased vein (vein stripping). ? Cutting off blood flow through the vein (laser ablation surgery). ? Repairing a valve.  Follow these instructions at home:  Wear compression stockings as told by your health care provider. These stockings help to prevent blood clots and reduce swelling in  your legs.  Take over-the-counter and prescription medicines only as told by your health care provider.  Stay active by exercising, walking, or doing different activities. Ask your health care provider what activities are safe for you and how much exercise you need.  Drink enough fluid to keep your urine clear or pale yellow.  Do not use any products that contain nicotine or tobacco, such as cigarettes and e-cigarettes. If you need help quitting, ask your health care provider.  Keep all follow-up visits as told by your health care provider. This is important. Contact a health care provider if:  You have redness, swelling, or more pain in the affected area.  You see a red streak or line that extends up or down from the affected area.  You have skin breakdown or a loss of skin in the affected area, even if the breakdown is small.  You get an injury in the affected area. Get help right away if:  You get an injury and an open wound in the affected area.  You   have severe pain that does not get better with medicine.  You have sudden numbness or weakness in the foot or ankle below the affected area, or you have trouble moving your foot or ankle.  You have a fever and you have worse or persistent symptoms.  You have chest pain.  You have shortness of breath. Summary  Chronic venous insufficiency, also called venous stasis, is a condition that prevents blood from being pumped effectively through the veins in your legs.  Chronic venous insufficiency occurs when the vein walls become stretched, weakened, or damaged, or when valves within the vein are damaged.  Treatment for this condition depends on how severe your condition is, and it may involve wearing compression stockings or having a procedure.  Make sure you stay active by exercising, walking, or doing different activities. Ask your health care provider what activities are safe for you and how much exercise you need. This  information is not intended to replace advice given to you by your health care provider. Make sure you discuss any questions you have with your health care provider. Document Released: 03/16/2007 Document Revised: 09/29/2016 Document Reviewed: 09/29/2016 Elsevier Interactive Patient Education  2017 Elsevier Inc.     To decrease swelling in your feet and legs: Elevate feet above slightly bent knees, feet above heart, overnight and 3-4 times per day for 20 minutes.  

## 2017-09-29 NOTE — Progress Notes (Signed)
Chief Complaint: Follow up Extracranial Carotid Artery Stenosis   History of Present Illness  [name] [name] is a 83 y.o. male whom Dr. Kellie Simmering has been monitoring with a known right ICA occlusion and mild to moderate left ICA stenosis. He returns today for routine surveillance.  He denies any neurologic symptoms such as lateralizing weakness, aphasia, amaurosis fugax, diplopia, blurred vision, and syncope.   He denies claudication symptoms with walking, denies non healing wounds. Patient has nothad previous carotid artery intervention.  Pt states he has had several MI's.   On 02/21/15 he had a right 3rd Ray Amputation by Dr. Sharol Given for infected right 3rd toe; this was after a corn was removed. This has completely healed.  He states the corns on the plantar surface of his feet are painful when he walks, therefore does not walk much.   Pt Diabetic: no Pt smoker: former smoker, quit in the 1990's  Pt meds include: Statin : yes ASA: yes Other anticoagulants/antiplatelets: warfarin for atrial fib   Past Medical History:  Diagnosis Date  . Angina    "jaw & elbow was the worse"  . Arrhythmia   . Atrial fibrillation ([place]) 03/2011  . Carotid artery occlusion    right total internal artery occlusion  . Coronary artery disease    s/p cardiac cath in 1990s, and cardiolite study  in 2005 showing EF 54%  . Degenerative disc disease    cervical spine  . GERD (gastroesophageal reflux disease)   . Hyperlipidemia   . Hypertension   . Hypothyroidism   . Myocardial infarction ([place]) 1992 and 1994  . Pneumonia ~ 1935  . Rheumatoid arthritis ([place])   . Skin cancer    forehead  . TIA (transient ischemic attack)   . Urgency incontinence     Social History Social History   Tobacco Use  . Smoking status: Former Smoker    Packs/day: 1.00    Years: 40.00    Pack years: 40.00    Types: Cigarettes    Last attempt to quit: 09/12/1989    Years since quitting: 28.0  . Smokeless  tobacco: Former Systems developer    Quit date: 11/26/1990  . Tobacco comment: quit around 1992  Substance Use Topics  . Alcohol use: No    Alcohol/week: 0.0 oz    Comment: former, quit drinking in 1992  . Drug use: No    Family History Family History  Problem Relation Age of Onset  . Heart disease Mother   . Malignant hyperthermia Mother   . Hypertension Mother   . Heart disease Father   . Malignant hyperthermia Father   . Hyperlipidemia Father   . Hypertension Father   . Heart disease Sister   . Hypertension Sister   . Heart disease Brother     Surgical History Past Surgical History:  Procedure Laterality Date  . CARDIAC CATHETERIZATION  1990s  . CATARACT EXTRACTION W/ INTRAOCULAR LENS  IMPLANT, BILATERAL    . SKIN CANCER EXCISION  12/2011   forehead    Allergies  Allergen Reactions  . Levofloxacin Rash    "thought they were sticking swords thru me"  . Klor-Con [Potassium Chloride Er] Diarrhea    DIARRHEA    Current Outpatient Medications  Medication Sig Dispense Refill  . aspirin 81 MG tablet Take 81 mg by mouth at bedtime.     Marland Kitchen atorvastatin (LIPITOR) 10 MG tablet TAKE 1 TABLET AT BEDTIME 90 tablet 2  . Calcium-Vitamin D (CALTRATE 600 PLUS-VIT D  PO) Take 1 tablet by mouth at bedtime.     . digoxin (LANOXIN) 0.125 MG tablet TAKE 1 TABLET DAILY 90 tablet 2  . Fish Oil-Krill Oil (KRILL OIL PLUS PO) Take 1 tablet by mouth daily. Krill Oil-Omega 3- DHA-EPA 300-90 (26-37) mg    . folic acid (FOLVITE) 1 MG tablet     . furosemide (LASIX) 40 MG tablet Take 1 tablet (40 mg total) by mouth 2 (two) times daily. 180 tablet 3  . levothyroxine (SYNTHROID, LEVOTHROID) 100 MCG tablet TAKE 1 TABLET DAILY (OFFICE VISIT FOR MORE REFILLS) 30 tablet 0  . loperamide (IMODIUM) 2 MG capsule Take 1 capsule (2 mg total) by mouth as needed for diarrhea or loose stools (max 8 mg daily). 30 capsule 0  . methotrexate (RHEUMATREX) 2.5 MG tablet Take 2.5 mg by mouth once a week.    . metoprolol succinate  (TOPROL-XL) 25 MG 24 hr tablet Take 0.5 tablets (12.5 mg total) by mouth daily. Take with or immediately following a meal. 45 tablet 3  . Misc Natural Products (PROSTATE HEALTH PO) Take 2 tablets by mouth daily. 160-100-100    . Multiple Vitamins-Minerals (OCUVITE PO) Take 1 tablet by mouth daily.    . nitroGLYCERIN (NITROSTAT) 0.4 MG SL tablet Place 1 tablet (0.4 mg total) under the tongue every 5 (five) minutes as needed for chest pain. 25 tablet 0  . Pediatric Multivitamins-Fl (MULTIPLE VITAMINS/FLUORIDE) 1 MG CHEW Chew 1 tablet by mouth daily.    Marland Kitchen spironolactone (ALDACTONE) 25 MG tablet Take 25 mg by mouth daily.    Marland Kitchen warfarin (COUMADIN) 5 MG tablet TAKE AS DIRECTED BY ANTICOAGULATION CLINIC 120 tablet 1   No current facility-administered medications for this visit.     Review of Systems : See HPI for pertinent positives and negatives.  Physical Examination  Vitals:   09/29/17 1548 09/29/17 1549  BP: 134/73 134/68  Resp: 20   SpO2: 97%   Weight: 164 lb (74.4 kg)   Height: 6' (1.829 m)    Body mass index is 22.24 kg/m.  General: WDWN male in NAD GAIT:seated in his w/c Eyes: PERRLA Pulmonary: Respirations are non-labored, CTAB, no rales, no rhonchi, or wheezing.  Cardiac: Regular rhythm, + murmur.  VASCULAR EXAM Carotid Bruits Right Left   Transmitted cardiac murmur Negative   Abdominal aortic pulse is not palpable. Radial pulses are 2+ palpable and equal.   LE Pulses Right Left  POPLITEAL not palpable not palpable  POSTERIOR TIBIAL not palpable not palpable  DORSALIS PEDIS ANTERIOR TIBIAL 1+ palpable faintly palpable    Gastrointestinal: soft, nontender, BS WNL, no r/g, no palpable masses.  Musculoskeletal: no muscle atrophy/wasting. M/S 5/5 throughout, extremities without ischemic changes. Both feet and lower legs with venous stasis dermatitis, some weeping of serous fluid onto right sock, no ulcers  but does have multiple fluid filled small blisters on his lower legs, 2-3+ non pitting edema in both lower legs and feet. Right third toe surgically absent.   Neurologic: A&O X 3; appropriate affect, speech is normal, CN 2-12 intact, pain and light touch intact in extremities, Motor exam as listed above.   Assessment: [name] [name] is a 83 y.o. male who has no recent history of stroke or TIA. He had a TIA in the early 2000's as manifested by disorientation and diaphoresis, was in the Summer, loading vehicle at [employer]. He denies a history of MI.   Chronic venous insufficiency with stasis dermatitis in both lower extremities: He has  knee high compression hose but is unable to put them on, the advanced stage of venous stasis dermatitis precludes use of compression hose. I advised him and his son in law how to elevate his legs :elevate feet above slightly bent knees, feet above heart, overnight and 3-4 times per day for 20 minutes.  Pt lives by himself, is not taking his diuretic as he has trouble getting to the restroom. His son in law is going to look into the [facility] program and also assisted living facilities.    DATA Carotid Duplex (09/29/17):  No color flow or spectral Doppler waveform is obtained involving the right internal carotid artery, known right internal carotid artery occlusion. Left internal carotid artery stenosis at 40-59%. Bilateral vertebral artery flow is antegrade.  Bilateral subclavian artery waveforms are normal.  No change compared to the exam on 09/19/2015, increased stenosis in the left ICA compared to the exam on 09/23/16.    Plan:  Follow-up in 1 year with Carotid Duplex scan.   Daily seated arm and leg exercises demonstrated and discussed since he is not walking much due to painful feet.     I discussed in depth with the patient the nature of atherosclerosis, and emphasized the importance of maximal medical management including strict control of  blood pressure, blood glucose, and lipid levels, obtaining regular exercise, and continued cessation of smoking.  The patient is aware that without maximal medical management the underlying atherosclerotic disease process will progress, limiting the benefit of any interventions. The patient was given information about stroke prevention and what symptoms should prompt the patient to seek immediate medical care. Thank you for allowing [place] to participate in this patient's care.  Clemon Chambers, RN, MSN, FNP-C [facility] Office: [facility] Physician: Early  09/29/17 4:02 PM

## 2017-09-30 ENCOUNTER — Ambulatory Visit: Payer: Medicare Other | Admitting: Cardiology

## 2017-09-30 ENCOUNTER — Other Ambulatory Visit (HOSPITAL_COMMUNITY): Payer: Medicare Other

## 2017-10-05 DIAGNOSIS — X32XXXD Exposure to sunlight, subsequent encounter: Secondary | ICD-10-CM | POA: Diagnosis not present

## 2017-10-05 DIAGNOSIS — L570 Actinic keratosis: Secondary | ICD-10-CM | POA: Diagnosis not present

## 2017-10-06 NOTE — Addendum Note (Signed)
Addended by: Lianne Cure A on: 10/06/2017 03:35 PM   Modules accepted: Orders

## 2017-10-12 ENCOUNTER — Ambulatory Visit (INDEPENDENT_AMBULATORY_CARE_PROVIDER_SITE_OTHER): Payer: Medicare Other | Admitting: Pharmacist

## 2017-10-12 ENCOUNTER — Telehealth: Payer: Self-pay | Admitting: Physician Assistant

## 2017-10-12 ENCOUNTER — Ambulatory Visit (INDEPENDENT_AMBULATORY_CARE_PROVIDER_SITE_OTHER): Payer: Medicare Other | Admitting: Physician Assistant

## 2017-10-12 ENCOUNTER — Encounter: Payer: Self-pay | Admitting: Physician Assistant

## 2017-10-12 VITALS — BP 130/78 | HR 46 | Ht 72.0 in | Wt 167.8 lb

## 2017-10-12 DIAGNOSIS — I2510 Atherosclerotic heart disease of native coronary artery without angina pectoris: Secondary | ICD-10-CM

## 2017-10-12 DIAGNOSIS — Z7901 Long term (current) use of anticoagulants: Secondary | ICD-10-CM

## 2017-10-12 DIAGNOSIS — I350 Nonrheumatic aortic (valve) stenosis: Secondary | ICD-10-CM | POA: Diagnosis not present

## 2017-10-12 DIAGNOSIS — I5043 Acute on chronic combined systolic (congestive) and diastolic (congestive) heart failure: Secondary | ICD-10-CM | POA: Diagnosis not present

## 2017-10-12 DIAGNOSIS — I482 Chronic atrial fibrillation: Secondary | ICD-10-CM | POA: Diagnosis not present

## 2017-10-12 DIAGNOSIS — W19XXXD Unspecified fall, subsequent encounter: Secondary | ICD-10-CM | POA: Diagnosis not present

## 2017-10-12 DIAGNOSIS — E785 Hyperlipidemia, unspecified: Secondary | ICD-10-CM | POA: Diagnosis not present

## 2017-10-12 DIAGNOSIS — I10 Essential (primary) hypertension: Secondary | ICD-10-CM | POA: Diagnosis not present

## 2017-10-12 DIAGNOSIS — I6523 Occlusion and stenosis of bilateral carotid arteries: Secondary | ICD-10-CM

## 2017-10-12 DIAGNOSIS — I4821 Permanent atrial fibrillation: Secondary | ICD-10-CM

## 2017-10-12 LAB — POCT INR: INR: 1.4

## 2017-10-12 NOTE — Patient Instructions (Signed)
Take 2 tablets today and 1.5 tablets tomorrow, then start taking 1.5 tablets daily except 1 tablet on Sundays, Tuesdays, and Thursdays.  Recheck in 2 weeks.

## 2017-10-12 NOTE — Progress Notes (Signed)
Cardiology Office Note    Date:  10/12/2017   ID:  Geza, Beranek [birth year], MRN [id number] PCP:  [name], NP  Cardiologist: Dr. Meda Coffee  Chief Complaint  Patient presents with  . Follow-up    History of Present Illness:  [name] [name] is a 83 y.o. male with history of chronic atrial fibrillation on Coumadin, CAD on remote cath in the 1990s with negative NST in 2015, history of systolic heart failure previous EF 45-50% however most recent echo 2017 normal EF 60-65%, with moderate aortic stenosis, HLD, carotid artery disease 71-69% LICA stenosis and chronic occlusion of the right ICA 2016 followed by vascular surgery.   Patient was seen by Dr. Meda Coffee 09/07/17 with improving edema since Lasix was increased.  His weight was unchanged but his edema had improved.  BNP was 3589 creatinine normal at 0.91 unfortunately he tripped in our waiting room and fell on his left elbow and shoulder and had to go to the emergency room.  Fortunately he had no fractures.  Blood pressure and heart rate were low that day so Toprol was decreased to 25 mg daily.  Repeat 2D echo was ordered because of aortic stenosis and fall.  It is being done today.   I saw the patient 09/22/17.He wasn't taking his Lasix regularly b/c it makes him incontinent. His weight was up 11 lbs. Although not sure it was accurate when looking at trends. I increased his Lasix 120 mg for 3 days and 80 mg daily. He was also falling a lot and daughter was trying to get him placed in a nursing facility but patient adamant about staying home. He is on coumadin for AFib.  Patient comes in today accompanied by his son-in-law.  His daughter called me before I went in with the patient.  She says he was dissolving his Lasix in water to make them think he was taking it.  He states he is only taken 1 Lasix a day because he has incontinence and wets his clothes even with wearing depends diaper.  He also claims it caused a lot of constipation.   His daughter states that he is not bathing himself appropriately but he is of sound mind.  He is asking for the car keys back but they have not given it to him.  Past Medical History:  Diagnosis Date  . Angina    "jaw & elbow was the worse"  . Arrhythmia   . Atrial fibrillation ([place]) 03/2011  . Carotid artery occlusion    right total internal artery occlusion  . Coronary artery disease    s/p cardiac cath in 1990s, and cardiolite study  in 2005 showing EF 54%  . Degenerative disc disease    cervical spine  . GERD (gastroesophageal reflux disease)   . Hyperlipidemia   . Hypertension   . Hypothyroidism   . Myocardial infarction ([place]) 1992 and 1994  . Pneumonia ~ 1935  . Rheumatoid arthritis ([place])   . Skin cancer    forehead  . TIA (transient ischemic attack)   . Urgency incontinence     Past Surgical History:  Procedure Laterality Date  . CARDIAC CATHETERIZATION  1990s  . CATARACT EXTRACTION W/ INTRAOCULAR LENS  IMPLANT, BILATERAL    . 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 SKIN CANCER EXCISION  12/2011   forehead    Current Medications: Current Meds  Medication Sig  . aspirin  81 MG tablet Take 81 mg by mouth at bedtime.   Marland Kitchen atorvastatin (LIPITOR) 10 MG tablet TAKE 1 TABLET AT BEDTIME  . Calcium-Vitamin D (CALTRATE 600 PLUS-VIT D PO) Take 1 tablet by mouth at bedtime.   . digoxin (LANOXIN) 0.125 MG tablet TAKE 1 TABLET DAILY  . Fish Oil-Krill Oil (KRILL OIL PLUS PO) Take 1 tablet by mouth daily. Krill Oil-Omega 3- DHA-EPA 300-90 (26-94) mg  . folic acid (FOLVITE) 1 MG tablet   . furosemide (LASIX) 40 MG tablet Take 1 tablet (40 mg total) by mouth 2 (two) times daily.  Marland Kitchen levothyroxine (SYNTHROID, LEVOTHROID) 100 MCG tablet TAKE 1 TABLET DAILY (OFFICE VISIT FOR MORE REFILLS)  . loperamide (IMODIUM) 2 MG capsule Take 1 capsule (2 mg total) by mouth as needed for diarrhea or loose stools (max 8 mg daily).  . methotrexate  (RHEUMATREX) 2.5 MG tablet Take 2.5 mg by mouth once a week.  . metoprolol succinate (TOPROL-XL) 25 MG 24 hr tablet Take 0.5 tablets (12.5 mg total) by mouth daily. Take with or immediately following a meal.  . Misc Natural Products (PROSTATE HEALTH PO) Take 2 tablets by mouth daily. 160-100-100  . Multiple Vitamins-Minerals (OCUVITE PO) Take 1 tablet by mouth daily.  . nitroGLYCERIN (NITROSTAT) 0.4 MG SL tablet Place 1 tablet (0.4 mg total) under the tongue every 5 (five) minutes as needed for chest pain.  . Pediatric Multivitamins-Fl (MULTIPLE VITAMINS/FLUORIDE) 1 MG CHEW Chew 1 tablet by mouth daily.  Marland Kitchen spironolactone (ALDACTONE) 25 MG tablet Take 25 mg by mouth daily.  Marland Kitchen warfarin (COUMADIN) 5 MG tablet TAKE AS DIRECTED BY ANTICOAGULATION CLINIC     Allergies:   Levofloxacin and Klor-con [potassium chloride er]   Social History   Socioeconomic History  . Marital status: Married    Spouse name: None  . Number of children: None  . Years of education: None  . Highest education level: None  Social Needs  . Financial resource strain: None  . Food insecurity - worry: None  . Food insecurity - inability: None  . Transportation needs - medical: None  . Transportation needs - non-medical: None  Occupational History  . None  Tobacco Use  . Smoking status: Former Smoker    Packs/day: 1.00    Years: 40.00    Pack years: 40.00    Types: Cigarettes    Last attempt to quit: 09/12/1989    Years since quitting: 28.1  . Smokeless tobacco: Former Systems developer    Quit date: 11/26/1990  . Tobacco comment: quit around 1992  Substance and Sexual Activity  . Alcohol use: No    Alcohol/week: 0.0 oz    Comment: former, quit drinking in 1992  . Drug use: No  . Sexual activity: Not Currently  Other Topics Concern  . 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   ROS:   Please see the history of present illness.    Review of Systems  Cardiovascular: Positive for dyspnea on exertion and leg swelling.  Respiratory: Positive for shortness of breath.   Gastrointestinal: Positive for constipation.  Genitourinary: Positive  for bladder incontinence, frequency, hesitancy and urgency.   All other systems reviewed and are negative.   PHYSICAL EXAM:   VS:  BP 130/78   Pulse (!) 46   Ht 6' (1.829 m)   Wt 167 lb 12.8 oz (76.1 kg)   SpO2 92%   BMI 22.76 kg/m   Physical Exam  GEN: Well nourished, well developed, in no acute distress  Neck: no JVD, carotid bruits, or masses Cardiac: Irregularly irregular with 3/6 harsh systolic murmur at the left sternal border Respiratory:  clear to auscultation bilaterally, normal work of breathing GI: soft, nontender, nondistended, + BS Ext: without cyanosis, clubbing, 4+ edema bilaterally up to his thighs Neuro:  Alert and Oriented x 3 Psych: euthymic mood, full affect  Wt Readings from Last 3 Encounters:  10/12/17 167 lb 12.8 oz (76.1 kg)  09/29/17 164 lb (74.4 kg)  09/22/17 164 lb (74.4 kg)      Studies/Labs Reviewed:   EKG:  EKG is  ordered today.     Recent Labs: 05/05/2017: ALT 31; HGB 11.0; Platelets 201 09/07/2017: BUN 12; Creatinine, Ser 0.91; NT-Pro BNP 3,589; Potassium 3.9; Sodium 140   Lipid Panel    Component Value Date/Time   CHOL 106 05/23/2015 0958   TRIG 89.0 05/23/2015 0958   TRIG 25 03/31/2010   HDL 34.40 (L) 05/23/2015 0958   CHOLHDL 3 05/23/2015 0958   VLDL 17.8 05/23/2015 0958   LDLCALC 54 05/23/2015 0958    Additional studies/ records that were reviewed today include:   2D echo 2/2017Study Conclusions   - Left ventricle: The cavity  size was normal. There was moderate   concentric hypertrophy. Systolic function was normal. The   estimated ejection fraction was in the range of 60% to 65%. Wall   motion was normal; there were no regional wall motion   abnormalities. Left ventricular diastolic function parameters   were normal. - Aortic valve: Trileaflet; severely thickened, severely calcified   leaflets. There was moderate stenosis. There was mild   regurgitation. Peak velocity (S): 328 cm/s. Mean gradient (S): 24   mm Hg. Valve area (VTI): 1.08 cm^2. Valve area (Vmax): 1.2 cm^2.   Valve area (Vmean): 1.02 cm^2. - Aorta: Ascending aortic diameter: 38 mm (S). - Ascending aorta: The ascending aorta was mildly dilated. - Mitral valve: Mobility was not restricted. Transvalvular velocity   was within the normal range. There was no evidence for stenosis. - Left atrium: The atrium was severely dilated. - Right ventricle: The cavity size was normal. Wall thickness was   normal. Systolic function was normal. - Right atrium: The atrium was mildly dilated. - Tricuspid valve: There was mild regurgitation. - Inferior vena cava: The vessel was dilated. The respirophasic   diameter changes were in the normal range (>= 50%), consistent   with normal central venous pressure.   Impressions:   - Moderate aortic stenosis by valve gradients. Visually, the valve   is heavily calcified and thickened. It is possible that there is   severe aortic stenosis but the maximum gradient was not   identified.            ASSESSMENT:    1. Acute on chronic combined systolic and diastolic CHF (congestive heart failure) ([place])   2. Permanent atrial fibrillation ([facility])   3. Atherosclerosis of native coronary artery of native heart without angina pectoris   4. Benign essential HTN   5. Aortic valve stenosis, etiology of cardiac valve disease unspecified   6. Hyperlipidemia,  unspecified hyperlipidemia type   7. Fall, subsequent encounter       PLAN:  In order of problems listed above:  Acute on chronic systolic CHF patient BNP was over 3600 when I saw him last.  He did not increase his Lasix as prescribed because of urinary incontinence.  Have recommended condom catheter, urinal at the bedside, home health nursing.  Increase Lasix to 80 mg once daily.  2D echo showed normal LVEF 50-55% with moderate aortic stenosis and moderate TR follow-up with Dr. Meda Coffee next available.  Chronic atrial fibrillation on Coumadin.  Patient has had recurrent falls.  To discuss continuation with Dr. Meda Coffee.  Moderate aortic stenosis on echo in 08/2017  CAD on remote cath in the 1990s but negative NST in 2015  Essential hypertension stable  Hyperlipidemia on Lipitor   recurrent falls secondary to being off balance On Coumadin is a concern.  Medication Adjustments/Labs and Tests Ordered: Current medicines are reviewed at length with the patient today.  Concerns regarding medicines are outlined above.  Medication changes, Labs and Tests ordered today are listed in the Patient Instructions below. Patient Instructions  Medication Instructions:  Your physician recommends that you continue on your current medications as directed. Please refer to the Current Medication list given to you today.  YOU SHOULD TAKE 2 LASIX   Labwork: None ordered  Testing/Procedures: None ordered  Follow-Up: Your physician recommends that you schedule a follow-up appointment in: [place]   Any Other Special Instructions Will Be Listed Below (If Applicable).  [facility] to [facility] for the condom catheters and a urinal    If you need a refill on your cardiac medications before your next appointment, please call your pharmacy.      Signed, Ermalinda Barrios, PA-C  10/12/2017 1:39 PM    [facility] [address], [place], [place]  [address] Phone: [phone]; Fax: [phone]

## 2017-10-12 NOTE — Telephone Encounter (Signed)
New message    Daughter [name] calling to speak with nurse from today.

## 2017-10-12 NOTE — Patient Instructions (Signed)
Medication Instructions:  Your physician recommends that you continue on your current medications as directed. Please refer to the Current Medication list given to you today.  YOU SHOULD TAKE 2 LASIX   Labwork: None ordered  Testing/Procedures: None ordered  Follow-Up: Your physician recommends that you schedule a follow-up appointment in: [place]   Any Other Special Instructions Will Be Listed Below (If Applicable).  You can go to [facility] for the condom catheters and a urinal    If you need a refill on your cardiac medications before your next appointment, please call your pharmacy.

## 2017-10-26 ENCOUNTER — Ambulatory Visit (INDEPENDENT_AMBULATORY_CARE_PROVIDER_SITE_OTHER): Payer: Medicare Other | Admitting: General Practice

## 2017-10-26 DIAGNOSIS — Z7901 Long term (current) use of anticoagulants: Secondary | ICD-10-CM

## 2017-10-26 DIAGNOSIS — I4891 Unspecified atrial fibrillation: Secondary | ICD-10-CM

## 2017-10-26 LAB — POCT INR: INR: 2.3

## 2017-10-26 NOTE — Patient Instructions (Addendum)
Pre visit review using our clinic review tool, if applicable. No additional management support is needed unless otherwise documented below in the visit note.  Continue to take 1.5 tablets daily except 1 tablet on Sundays, Tuesdays, and Thursdays.  Recheck in 4 weeks at [place] office.

## 2017-10-26 NOTE — Progress Notes (Signed)
I agree with this plan.

## 2017-10-27 ENCOUNTER — Encounter: Payer: Self-pay | Admitting: Podiatry

## 2017-10-27 ENCOUNTER — Ambulatory Visit (INDEPENDENT_AMBULATORY_CARE_PROVIDER_SITE_OTHER): Payer: Medicare Other | Admitting: Podiatry

## 2017-10-27 DIAGNOSIS — M79676 Pain in unspecified toe(s): Secondary | ICD-10-CM | POA: Diagnosis not present

## 2017-10-27 DIAGNOSIS — B351 Tinea unguium: Secondary | ICD-10-CM

## 2017-10-27 NOTE — Progress Notes (Signed)
Patient ID: [name] [name], male   DOB: February 17, 1925, 83 y.o.   MRN: [id number] HPI  Complaint:  Visit Type: Patient returns to my office for continued preventative foot care services. Complaint: Patient states" my nails have grown long and thick and become painful to walk and wear shoes" . He presents for preventative foot care services. No changes to ROS  Podiatric Exam: Vascular: dorsalis pedis and posterior tibial pulses are negative. Capillary return is immediate. Temperature gradient is negative. Skin turgor WNL,   Sensorium: Normal Semmes Weinstein monofilament test. Normal tactile sensation bilaterally.  Nail Exam: Pt has thick disfigured discolored nails with subungual debris noted bilateral entire nail hallux through fifth toenails Ulcer Exam: There is no evidence of ulcer or pre-ulcerative changes or infection. Orthopedic Exam: Muscle tone and strength are WNL. No limitations in general ROM. No crepitus or effusions noted. Foot type and digits show no abnormalities. Bony prominences are unremarkable. Amputation third toe right foot. Skin: No Porokeratosis. No infection or ulcers.   Diagnosis:  Tinea unguium, Pain in right toe, pain in left toes, Porokeratosis  Treatment & Plan Procedures and Treatment: Consent by patient was obtained for treatment procedures. The patient understood the discussion of treatment and procedures well. All questions were answered thoroughly reviewed. Debridement of mycotic and hypertrophic toenails, 1 through 5 bilateral and clearing of subungual debris. No ulceration, no infection noted.  Return Visit-Office Procedure: Patient instructed to return to the office for a follow up visit 10 weeks for continued evaluation and treatment.  Gardiner Barefoot DPM

## 2017-11-04 ENCOUNTER — Ambulatory Visit: Payer: Medicare Other | Admitting: Hematology

## 2017-11-04 ENCOUNTER — Other Ambulatory Visit: Payer: Medicare Other

## 2017-11-05 ENCOUNTER — Other Ambulatory Visit: Payer: Medicare Other

## 2017-11-05 ENCOUNTER — Ambulatory Visit: Payer: Medicare Other | Admitting: Hematology

## 2017-11-30 ENCOUNTER — Ambulatory Visit: Payer: Medicare Other

## 2017-12-04 NOTE — Progress Notes (Signed)
Marland Kitchen  HEMATOLOGY ONCOLOGY PROGRESS NOTE  Date of service: 12/07/17  Patient Care Team: [name], NP as PCP - General (Family Medicine)  CC: f/u for Anemia  INTERVAL HISTORY:  Patient is here for followup his multifactorial anemia. He is accompanied by his son today. He notes no evidence of overt bleeding. His hemoglobin today is stable and improved to 11.6 today (12/07/17) up from 11 on his last visit. He continues on PO Prednisone for his hx of RA. He notes that he is still on Coumadin for his A-fib. He was placed back on 5 tablets of methotrexate every Sunday. He is also on folic acid as well. His son states that there is someone coming in every night to ensure that he eats a meal every night. He takes colace with probiotics to aid with his bowel movements.    On review of systems, he reports intermittent bowel issues, dry stools. He denies blood in stools and any other symptoms.      REVIEW OF SYSTEMS:    10 Point review of systems of done and is negative except as noted above.  . Past Medical History:  Diagnosis Date  . Angina    "jaw & elbow was the worse"  . Arrhythmia   . Atrial fibrillation ([place]) 03/2011  . Carotid artery occlusion    right total internal artery occlusion  . Coronary artery disease    s/p cardiac cath in 1990s, and cardiolite study  in 2005 showing EF 54%  . Degenerative disc disease    cervical spine  . GERD (gastroesophageal reflux disease)   . Hyperlipidemia   . Hypertension   . Hypothyroidism   . Myocardial infarction ([place]) 1992 and 1994  . Pneumonia ~ 1935  . Rheumatoid arthritis ([place])   . Skin cancer    forehead  . TIA (transient ischemic attack)   . Urgency incontinence     . Past Surgical History:  Procedure Laterality Date  . 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  CARDIAC CATHETERIZATION  1990s  . CATARACT EXTRACTION W/ INTRAOCULAR LENS  IMPLANT, BILATERAL    . SKIN CANCER EXCISION  12/2011   forehead    . Social History   Tobacco Use  . Smoking status: Former Smoker    Packs/day: 1.00    Years: 40.00    Pack years: 40.00    Types: Cigarettes    Last attempt to quit: 09/12/1989    Years since quitting: 28.2  . Smokeless tobacco: Former Systems developer    Quit date: 11/26/1990  . Tobacco comment: quit around 1992  Substance Use Topics  . Alcohol use: No    Alcohol/week: 0.0 oz    Comment: former, quit drinking in 1992  . Drug use: No    ALLERGIES:  is allergic to levofloxacin and klor-con [potassium chloride er].  MEDICATIONS:  Current Outpatient Medications  Medication Sig Dispense Refill  . aspirin 81 MG tablet Take 81 mg by mouth at bedtime.     Marland Kitchen atorvastatin (LIPITOR) 10 MG tablet TAKE 1 TABLET AT BEDTIME 90 tablet 2  . Calcium-Vitamin D (CALTRATE 600 PLUS-VIT D PO) Take 1 tablet by mouth at bedtime.     . digoxin (LANOXIN) 0.125 MG tablet TAKE 1 TABLET DAILY 90 tablet 2  . Fish Oil-Krill Oil (KRILL OIL PLUS PO) Take  1 tablet by mouth daily. Krill Oil-Omega 3- DHA-EPA 300-90 (36-46) mg    . folic acid (FOLVITE) 1 MG tablet     . furosemide (LASIX) 40 MG tablet Take 1 tablet (40 mg total) by mouth 2 (two) times daily. 180 tablet 3  . levothyroxine (SYNTHROID, LEVOTHROID) 100 MCG tablet TAKE 1 TABLET DAILY (OFFICE VISIT FOR MORE REFILLS) 30 tablet 0  . loperamide (IMODIUM) 2 MG capsule Take 1 capsule (2 mg total) by mouth as needed for diarrhea or loose stools (max 8 mg daily). 30 capsule 0  . methotrexate (RHEUMATREX) 2.5 MG tablet Take 2.5 mg by mouth once a week.    . metoprolol succinate (TOPROL-XL) 25 MG 24 hr tablet Take 0.5 tablets (12.5 mg total) by mouth daily. Take with or immediately following a meal. 45 tablet 3  . Misc Natural Products (PROSTATE HEALTH PO) Take 2 tablets by mouth daily. 160-100-100    . Multiple Vitamins-Minerals  (OCUVITE PO) Take 1 tablet by mouth daily.    . nitroGLYCERIN (NITROSTAT) 0.4 MG SL tablet Place 1 tablet (0.4 mg total) under the tongue every 5 (five) minutes as needed for chest pain. 25 tablet 0  . Pediatric Multivitamins-Fl (MULTIPLE VITAMINS/FLUORIDE) 1 MG CHEW Chew 1 tablet by mouth daily.    Marland Kitchen spironolactone (ALDACTONE) 25 MG tablet Take 25 mg by mouth daily.    Marland Kitchen warfarin (COUMADIN) 5 MG tablet TAKE AS DIRECTED BY ANTICOAGULATION CLINIC 120 tablet 1   No current facility-administered medications for this visit.     PHYSICAL EXAMINATION:  ECOG PERFORMANCE STATUS: 1 - Symptomatic but completely ambulatory  . Vitals:   12/07/17 1341  BP: 114/63  Pulse: 78  Resp: 20  Temp: 97.7 F (36.5 C)  SpO2: 96%    Filed Weights   12/07/17 1341  Weight: 177 lb 6.4 oz (80.5 kg)   .Body mass index is 24.06 kg/m.  GENERAL:alert, in no acute distress and comfortable SKIN: no acute rashes, no significant lesions EYES: conjunctiva are pink and non-injected, sclera anicteric OROPHARYNX: MMM, no exudates, no oropharyngeal erythema or ulceration NECK: supple, no JVD LYMPH:  no palpable lymphadenopathy in the cervical, axillary or inguinal regions LUNGS: clear to auscultation b/l with normal respiratory effort HEART: regular rate & rhythm ABDOMEN:  normoactive bowel sounds , non tender, not distended.No palpable hepatosplenomegaly Extremity: no pedal edema PSYCH: alert & oriented x 3 with fluent speech NEURO: no focal motor/sensory deficits  LABORATORY DATA:   I have reviewed the data as listed  Component     Latest Ref Rng & Units 12/07/2017  WBC     4.0 - 10.3 K/uL 6.1  RBC     4.20 - 5.82 MIL/uL 3.33 (L)  Hemoglobin     13.0 - 17.1 g/dL 11.6 (L)  HCT     38.4 - 49.9 % 35.0 (L)  MCV     79.3 - 98.0 fL 105.1 (H)  MCH     27.2 - 33.4 pg 34.8 (H)  MCHC     32.0 - 36.0 g/dL 33.1  RDW     11.0 - 15.6 % 14.4  Platelets     140 - 400 K/uL 206  Neutrophils     % 66   NEUT#     1.5 - 6.5 K/uL 4.0  Lymphocytes     % 11  Lymphocyte #     0.9 - 3.3 K/uL 0.7 (L)  Monocytes Relative     % 16  Monocyte #  0.1 - 0.9 K/uL 1.0 (H)  Eosinophil     % 6  Eosinophils Absolute     0.0 - 0.5 K/uL 0.4  Basophil     % 1  Basophils Absolute     0.0 - 0.1 K/uL 0.0    CBC Latest Ref Rng & Units 12/07/2017 05/05/2017 01/27/2017  WBC 4.0 - 10.3 K/uL 6.1 6.0 9.2  Hemoglobin 13.0 - 17.1 g/dL - 11.0(L) 10.4(L)  Hematocrit 38.4 - 49.9 % 35.0(L) 33.7(L) 33.2(L)  Platelets 140 - 400 K/uL 206 201 211   . CBC    Component Value Date/Time   WBC 6.0 05/05/2017 1437   WBC 7.5 01/07/2016 1402   RBC 3.33 (L) 12/07/2017 1316   RBC 3.33 (L) 12/07/2017 1316   HGB 11.0 (L) 05/05/2017 1437   HCT 35.0 (L) 12/07/2017 1316   HCT 33.7 (L) 05/05/2017 1437   PLT 201 05/05/2017 1437   MCV 105.1 (H) 12/07/2017 1316   MCV 96.8 05/05/2017 1437   MCH 34.8 (H) 12/07/2017 1316   MCHC 33.1 12/07/2017 1316   RDW 14.4 12/07/2017 1316   RDW 15.3 (H) 05/05/2017 1437   LYMPHSABS 0.7 (L) 12/07/2017 1316   LYMPHSABS 1.0 05/05/2017 1437   MONOABS 1.0 (H) 12/07/2017 1316   MONOABS 0.4 05/05/2017 1437   EOSABS 0.4 12/07/2017 1316   EOSABS 0.4 05/05/2017 1437   BASOSABS 0.0 12/07/2017 1316   BASOSABS 0.0 05/05/2017 1437    . CMP Latest Ref Rng & Units 12/07/2017 09/07/2017 06/02/2017  Glucose 70 - 140 mg/dL 84 96 107(H)  BUN 7 - 26 mg/dL _0 Creatinine 0.70 - 1.30 mg/dL 1.02 0.91 0.98  Sodium 136 - 145 mmol/L 139 140 138  Potassium 3.5 - 5.1 mmol/L 3.6 3.9 4.0  Chloride 98 - 109 mmol/L 100 98 98  CO2 22 - 29 mmol/L 30(H) 27 28  Calcium 8.4 - 10.4 mg/dL 9.7 8.9 8.8  Total Protein 6.4 - 8.3 g/dL 6.5 - -  Total Bilirubin 0.2 - 1.2 mg/dL 0.5 - -  Alkaline Phos 40 - 150 U/L 65 - -  AST 5 - 34 U/L 34 - -  ALT 0 - 55 U/L 30 - -   . Lab Results  Component Value Date   IRON 45 12/07/2017   TIBC 250 12/07/2017   IRONPCTSAT 18 (L) 12/07/2017   (Iron and TIBC)  Lab Results   Component Value Date   FERRITIN 235 12/07/2017   . Lab Results  Component Value Date   LDH 210 12/08/2016   Component     Latest Ref Rng & Units 12/08/2016 12/08/2016         1:50 PM  1:50 PM  Interpretation         Comment:         Specimen:         Submitted Dx:         Viability:         Cell Population         Granulocytes:         Monocytes:         Antibodies Performed:         Director Review         Total Protein     6.0 - 8.5 g/dL  6.1  Albumin     2.9 - 4.4 g/dL  3.7  Alpha 1     0.0 - 0.4 g/dL  0.2  Alpha 2  0.4 - 1.0 g/dL  0.6  Beta     0.7 - 1.3 g/dL  0.9  Gamma Globulin     0.4 - 1.8 g/dL  0.7  M-SPIKE, %     Not Observed g/dL  Not Observed  Globulin, Total     2.2 - 3.9 g/dL  2.4  A/G Ratio     0.7 - 1.7  1.5  Please Note:       Comment  Interpretation(See Below)       Comment  HEMOGLOBIN F QUANTITATION     0.0 - 2.0 %  0.0  Hgb A     96.4 - 98.8 %  98.0  HGB S     0.0 %  0.0  HGB C     0.0 %  0.0  HEMOGLOBIN A2 QUANTITATION     1.8 - 3.2 %  2.0  HGB VARIANT     0.0 %  0.0  HGB INTERPRETATION       Comment  Erythropoietin     2.6 - 18.5 mIU/mL  42.6 (H)  Folate     >3.0 ng/mL  >20.0  Vitamin B12     232 - 1245 pg/mL  1,185  Haptoglobin     34 - 200 mg/dL  61  LDH     125 - 245 U/L 210   Methylmalonic Acid, Serum     0 - 378 nmol/L  310    RADIOGRAPHIC STUDIES: I have personally reviewed the radiological images as listed and agreed with the findings in the report. No results found.  ASSESSMENT & PLAN:   84 yo with   1) Multifactorial Normocytic Anemia. This appears to be primarily related to anemia of chronic inflammation related to his rheumatoid arthritis along with perhaps some element of functional iron deficiency. It was also partially related to his methotrexate which he is off of since November 2017 but has been put back on. Hemoglobin is improved to 11.6. Ferritin was trending downwards and suggested  some ongoing slow GI losses but is back up to 235 after IV iron replacement.   Cannot rule out an element of low level MDS. -labs as of today, 1/14/ 2019:hgb 11.6,  RBC at 3.33, HCT at 35, MCV at 105.1, MCH at 34.8, lymphs abs at 0.7, monocytes absolute at 1.  -ferritin levels are adequate at 235 -no indication for additional IV Iron at this time. -monitoring for GI bleeding on anticoagulation. -if Hgb <9 despite ferritin >100 -- might need to consider adjusting MTX and considering EPO - not indicated currently. -would recommend atleast 37m of folic acid daily while on MTX.  RTC with Dr KIrene Limbowith labs in 4 months  I spent 15 minutes counseling the patient face to face. The total time spent in the appointment was 20 minutes and more than 50% was on counseling and direct patient cares.    GSullivan LoneMD MThree PointsAAHIVMS SCrenshaw Community HospitalCReading HospitalHematology/Oncology Physician [facility] (Office):       [phone](Work cell):  [phone](Fax):           [phone] This document serves as a record of services personally performed by GSullivan Lone MD. It was created on his behalf by SSteva Colder a trained medical scribe. The creation of this record is based on the scribe's personal observations and the provider's statements to them.   .I have reviewed the above documentation for accuracy and completeness, and I agree with the above. .Marland Kitchen  Brunetta Genera MD MS

## 2017-12-07 ENCOUNTER — Inpatient Hospital Stay: Payer: Medicare Other | Attending: Hematology

## 2017-12-07 ENCOUNTER — Inpatient Hospital Stay (HOSPITAL_BASED_OUTPATIENT_CLINIC_OR_DEPARTMENT_OTHER): Payer: Medicare Other | Admitting: Hematology

## 2017-12-07 ENCOUNTER — Encounter: Payer: Self-pay | Admitting: Hematology

## 2017-12-07 VITALS — BP 114/63 | HR 78 | Temp 97.7°F | Resp 20 | Ht 72.0 in | Wt 177.4 lb

## 2017-12-07 DIAGNOSIS — Z85828 Personal history of other malignant neoplasm of skin: Secondary | ICD-10-CM | POA: Insufficient documentation

## 2017-12-07 DIAGNOSIS — I4891 Unspecified atrial fibrillation: Secondary | ICD-10-CM

## 2017-12-07 DIAGNOSIS — D649 Anemia, unspecified: Secondary | ICD-10-CM | POA: Diagnosis not present

## 2017-12-07 DIAGNOSIS — M069 Rheumatoid arthritis, unspecified: Secondary | ICD-10-CM

## 2017-12-07 DIAGNOSIS — I252 Old myocardial infarction: Secondary | ICD-10-CM | POA: Insufficient documentation

## 2017-12-07 DIAGNOSIS — Z87891 Personal history of nicotine dependence: Secondary | ICD-10-CM | POA: Insufficient documentation

## 2017-12-07 DIAGNOSIS — I10 Essential (primary) hypertension: Secondary | ICD-10-CM | POA: Diagnosis not present

## 2017-12-07 DIAGNOSIS — Z7901 Long term (current) use of anticoagulants: Secondary | ICD-10-CM | POA: Diagnosis not present

## 2017-12-07 DIAGNOSIS — D638 Anemia in other chronic diseases classified elsewhere: Secondary | ICD-10-CM

## 2017-12-07 DIAGNOSIS — D508 Other iron deficiency anemias: Secondary | ICD-10-CM

## 2017-12-07 LAB — CBC WITH DIFFERENTIAL (CANCER CENTER ONLY)
Basophils Absolute: 0 10*3/uL (ref 0.0–0.1)
Basophils Relative: 1 %
Eosinophils Absolute: 0.4 10*3/uL (ref 0.0–0.5)
Eosinophils Relative: 6 %
HEMATOCRIT: 35 % — AB (ref 38.4–49.9)
Hemoglobin: 11.6 g/dL — ABNORMAL LOW (ref 13.0–17.1)
LYMPHS PCT: 11 %
Lymphs Abs: 0.7 10*3/uL — ABNORMAL LOW (ref 0.9–3.3)
MCH: 34.8 pg — ABNORMAL HIGH (ref 27.2–33.4)
MCHC: 33.1 g/dL (ref 32.0–36.0)
MCV: 105.1 fL — AB (ref 79.3–98.0)
Monocytes Absolute: 1 10*3/uL — ABNORMAL HIGH (ref 0.1–0.9)
Monocytes Relative: 16 %
NEUTROS ABS: 4 10*3/uL (ref 1.5–6.5)
Neutrophils Relative %: 66 %
Platelet Count: 206 10*3/uL (ref 140–400)
RBC: 3.33 MIL/uL — ABNORMAL LOW (ref 4.20–5.82)
RDW: 14.4 % (ref 11.0–15.6)
WBC Count: 6.1 10*3/uL (ref 4.0–10.3)

## 2017-12-07 LAB — COMPREHENSIVE METABOLIC PANEL
ALBUMIN: 3.7 g/dL (ref 3.5–5.0)
ALT: 30 U/L (ref 0–55)
AST: 34 U/L (ref 5–34)
Alkaline Phosphatase: 65 U/L (ref 40–150)
Anion gap: 9 (ref 3–11)
BUN: 12 mg/dL (ref 7–26)
CHLORIDE: 100 mmol/L (ref 98–109)
CO2: 30 mmol/L — ABNORMAL HIGH (ref 22–29)
Calcium: 9.7 mg/dL (ref 8.4–10.4)
Creatinine, Ser: 1.02 mg/dL (ref 0.70–1.30)
GFR calc Af Amer: >60 mL/min (ref >60)
GFR calc non Af Amer: >60 mL/min (ref >60)
GLUCOSE: 84 mg/dL (ref 70–140)
POTASSIUM: 3.6 mmol/L (ref 3.5–5.1)
SODIUM: 139 mmol/L (ref 136–145)
Total Bilirubin: 0.5 mg/dL (ref 0.2–1.2)
Total Protein: 6.5 g/dL (ref 6.4–8.3)

## 2017-12-07 LAB — RETICULOCYTES
RBC.: 3.33 MIL/uL — ABNORMAL LOW (ref 4.20–5.82)
RETIC COUNT ABSOLUTE: 66.6 10*3/uL (ref 34.8–93.9)
Retic Ct Pct: 2 % — ABNORMAL HIGH (ref 0.8–1.8)

## 2017-12-07 LAB — FERRITIN: FERRITIN: 235 ng/mL (ref 22–316)

## 2017-12-07 LAB — IRON AND TIBC
Iron: 45 ug/dL (ref 42–163)
SATURATION RATIOS: 18 % — AB (ref 42–163)
TIBC: 250 ug/dL (ref 202–409)
UIBC: 205 ug/dL

## 2017-12-08 ENCOUNTER — Telehealth: Payer: Self-pay | Admitting: Hematology

## 2017-12-08 NOTE — Telephone Encounter (Signed)
Called regarding 5/16

## 2017-12-09 ENCOUNTER — Ambulatory Visit (INDEPENDENT_AMBULATORY_CARE_PROVIDER_SITE_OTHER): Payer: Medicare Other | Admitting: General Practice

## 2017-12-09 DIAGNOSIS — Z7901 Long term (current) use of anticoagulants: Secondary | ICD-10-CM

## 2017-12-09 LAB — POCT INR: INR: 1.2

## 2017-12-09 NOTE — Progress Notes (Signed)
I agree with this plan.

## 2017-12-09 NOTE — Patient Instructions (Addendum)
Pre visit review using our clinic review tool, if applicable. No additional management support is needed unless otherwise documented below in the visit note.  Take 2 tablets today (1/16) and take 2 tablets tomorrow (1/17) and then continue to take 1.5 tablets daily except 1 tablet on Sundays, Tuesdays, and Thursdays.  Recheck in 1 weeks at [facility] office.

## 2017-12-16 ENCOUNTER — Telehealth: Payer: Self-pay | Admitting: Adult Health

## 2017-12-16 ENCOUNTER — Ambulatory Visit (INDEPENDENT_AMBULATORY_CARE_PROVIDER_SITE_OTHER): Payer: Medicare Other | Admitting: General Practice

## 2017-12-16 DIAGNOSIS — Z7901 Long term (current) use of anticoagulants: Secondary | ICD-10-CM | POA: Diagnosis not present

## 2017-12-16 DIAGNOSIS — I4891 Unspecified atrial fibrillation: Secondary | ICD-10-CM

## 2017-12-16 LAB — POCT INR: INR: 1.8

## 2017-12-16 NOTE — Progress Notes (Signed)
I have reviewed and agree with this plan  

## 2017-12-16 NOTE — Telephone Encounter (Signed)
Pt would like to have a pneumonia shot is he eligible for one.  States he is coughing up a lot of phlegm.

## 2017-12-16 NOTE — Patient Instructions (Addendum)
Pre visit review using our clinic review tool, if applicable. No additional management support is needed unless otherwise documented below in the visit note.  Take 2 tablets today (1/23) and then continue to take 1.5 tablets daily except 1 tablet on Sundays, Tuesdays, and Thursdays.  Recheck in 3 to 4 weeks.

## 2017-12-17 NOTE — Telephone Encounter (Signed)
Called and spoke to the pt.  Advised that he will need an appt for his cough before we should give him any injections.  Pt agreed and will call back to schedule.  He no longer drives and is dependent on his children.

## 2017-12-18 ENCOUNTER — Ambulatory Visit: Payer: Medicare Other | Admitting: Family Medicine

## 2017-12-18 ENCOUNTER — Ambulatory Visit (INDEPENDENT_AMBULATORY_CARE_PROVIDER_SITE_OTHER): Payer: Medicare Other | Admitting: Emergency Medicine

## 2017-12-18 ENCOUNTER — Other Ambulatory Visit: Payer: Self-pay

## 2017-12-18 ENCOUNTER — Other Ambulatory Visit: Payer: Self-pay | Admitting: Family Medicine

## 2017-12-18 ENCOUNTER — Other Ambulatory Visit: Payer: Self-pay | Admitting: General Practice

## 2017-12-18 ENCOUNTER — Encounter: Payer: Self-pay | Admitting: Emergency Medicine

## 2017-12-18 VITALS — BP 108/58 | HR 64 | Temp 98.8°F | Resp 16 | Ht 71.25 in | Wt 174.6 lb

## 2017-12-18 DIAGNOSIS — J22 Unspecified acute lower respiratory infection: Secondary | ICD-10-CM | POA: Insufficient documentation

## 2017-12-18 DIAGNOSIS — R05 Cough: Secondary | ICD-10-CM

## 2017-12-18 DIAGNOSIS — R059 Cough, unspecified: Secondary | ICD-10-CM | POA: Insufficient documentation

## 2017-12-18 MED ORDER — WARFARIN SODIUM 5 MG PO TABS: ORAL_TABLET | ORAL | 1 refills | Status: DC

## 2017-12-18 MED ORDER — AMOXICILLIN-POT CLAVULANATE 875-125 MG PO TABS: 1.0000 | ORAL_TABLET | Freq: Two times a day (BID) | ORAL | 0 refills | Status: AC

## 2017-12-18 NOTE — Telephone Encounter (Signed)
Requesting to speak to nurse [phone]

## 2017-12-18 NOTE — Telephone Encounter (Signed)
Please Advise

## 2017-12-18 NOTE — Patient Instructions (Addendum)
     IF you received an x-ray today, you will receive an invoice from [facility]. Please contact  Radiology at [phone] with questions or concerns regarding your invoice.   IF you received labwork today, you will receive an invoice from [facility]. Please contact [facility] at [phone] with questions or concerns regarding your invoice.   Our billing staff will not be able to assist you with questions regarding bills from these companies.  You will be contacted with the lab results as soon as they are available. The fastest way to get your results is to activate your My Chart account. Instructions are located on the last page of this paperwork. If you have not heard from us regarding the results in 2 weeks, please contact this office.     Cough, Adult A cough helps to clear your throat and lungs. A cough may last only 2-3 weeks (acute), or it may last longer than 8 weeks (chronic). Many different things can cause a cough. A cough may be a sign of an illness or another medical condition. Follow these instructions at home:  Pay attention to any changes in your cough.  Take medicines only as told by your doctor. ? If you were prescribed an antibiotic medicine, take it as told by your doctor. Do not stop taking it even if you start to feel better. ? Talk with your doctor before you try using a cough medicine.  Drink enough fluid to keep your pee (urine) clear or pale yellow.  If the air is dry, use a cold steam vaporizer or humidifier in your home.  Stay away from things that make you cough at work or at home.  If your cough is worse at night, try using extra pillows to raise your head up higher while you sleep.  Do not smoke, and try not to be around smoke. If you need help quitting, ask your doctor.  Do not have caffeine.  Do not drink alcohol.  Rest as needed. Contact a doctor if:  You have new problems (symptoms).  You cough up yellow fluid  (pus).  Your cough does not get better after 2-3 weeks, or your cough gets worse.  Medicine does not help your cough and you are not sleeping well.  You have pain that gets worse or pain that is not helped with medicine.  You have a fever.  You are losing weight and you do not know why.  You have night sweats. Get help right away if:  You cough up blood.  You have trouble breathing.  Your heartbeat is very fast. This information is not intended to replace advice given to you by your health care provider. Make sure you discuss any questions you have with your health care provider. Document Released: 07/24/2011 Document Revised: 04/17/2016 Document Reviewed: 01/17/2015 Elsevier Interactive Patient Education  2018 [employer]  

## 2017-12-18 NOTE — Telephone Encounter (Signed)
Spoke to the pt.  He wanted to inform me that he has an appt today and another office to be seen for his cough.  Wanted to see Tommi Rumps but not able.  Wished the pt well and advised him to call back if needed.

## 2017-12-18 NOTE — Progress Notes (Signed)
[name] [name] 84 y.o.   Chief Complaint  Patient presents with  . Cough    x 6 days and nasal drainage    HISTORY OF PRESENT ILLNESS: This is a 84 y.o. male complaining of 6 day h/o productive cough and nasal discharge.  Cough  This is a new problem. The current episode started in the past 7 days. The problem has been gradually worsening. The cough is productive of sputum. Pertinent negatives include no chest pain, chills, fever, headaches, hemoptysis, myalgias, rash, sore throat, shortness of breath or wheezing. He has tried nothing for the symptoms. His past medical history is significant for asthma and COPD.     Prior to Admission medications   Medication Sig Start Date End Date Taking? Authorizing Provider  aspirin 81 MG tablet Take 81 mg by mouth at bedtime.    Yes [provider]  atorvastatin (LIPITOR) 10 MG tablet TAKE 1 TABLET AT BEDTIME 08/12/17  Yes Dorothy Spark, MD  Calcium-Vitamin D (CALTRATE 600 PLUS-VIT D PO) Take 1 tablet by mouth at bedtime.    Yes [provider]  digoxin (LANOXIN) 0.125 MG tablet TAKE 1 TABLET DAILY 08/12/17  Yes Dorothy Spark, MD  Fish Oil-Krill Oil (KRILL OIL PLUS PO) Take 1 tablet by mouth daily. Krill Oil-Omega 3- DHA-EPA 300-90 (27-45) mg   Yes [provider]  folic acid (FOLVITE) 1 MG tablet  03/06/16  Yes [provider]  furosemide (LASIX) 40 MG tablet Take 1 tablet (40 mg total) by mouth 2 (two) times daily. 09/08/17 09/03/18 Yes Dorothy Spark, MD  levothyroxine (SYNTHROID, LEVOTHROID) 100 MCG tablet TAKE 1 TABLET DAILY (OFFICE VISIT FOR MORE REFILLS) 01/18/16  Yes Dorena Cookey, MD  loperamide (IMODIUM) 2 MG capsule Take 1 capsule (2 mg total) by mouth as needed for diarrhea or loose stools (max 8 mg daily). 02/23/15  Yes Tat, Shanon Brow, MD  methotrexate (RHEUMATREX) 2.5 MG tablet Take 2.5 mg by mouth once a week. 08/06/17  Yes [provider]  metoprolol succinate (TOPROL-XL) 25 MG 24  hr tablet Take 0.5 tablets (12.5 mg total) by mouth daily. Take with or immediately following a meal. 09/08/17 09/08/18 Yes Dorothy Spark, MD  Misc Natural Products (PROSTATE HEALTH PO) Take 2 tablets by mouth daily. 160-100-100   Yes [provider]  Multiple Vitamins-Minerals (OCUVITE PO) Take 1 tablet by mouth daily.   Yes [provider]  nitroGLYCERIN (NITROSTAT) 0.4 MG SL tablet Place 1 tablet (0.4 mg total) under the tongue every 5 (five) minutes as needed for chest pain. 05/22/17  Yes Burchette, Alinda Sierras, MD  Pediatric Multivitamins-Fl (MULTIPLE VITAMINS/FLUORIDE) 1 MG CHEW Chew 1 tablet by mouth daily.   Yes [provider]  spironolactone (ALDACTONE) 25 MG tablet Take 25 mg by mouth daily.   Yes [provider]  warfarin (COUMADIN) 5 MG tablet TAKE AS DIRECTED BY ANTICOAGULATION CLINIC 12/18/17  Yes Nafziger, Tommi Rumps, NP  diphenhydrAMINE (BENADRYL) 25 MG tablet Take 25 mg by mouth every 6 (six) hours as needed. For itching  02/05/12  [provider]  methotrexate (RHEUMATREX) 2.5 MG tablet Take 10 mg by mouth once a week. Sunday  02/10/12  [provider]    Allergies  Allergen Reactions  . Levofloxacin Rash    "thought they were sticking swords thru me"  . Klor-Con [Potassium Chloride Er] Diarrhea    DIARRHEA    Patient Active Problem List   Diagnosis Date Noted  . Falls 09/22/2017  .  Iron deficiency anemia 12/16/2016  . Aortic stenosis 04/10/2015  . Protein-calorie malnutrition, severe ([place]) 02/23/2015  . Osteomyelitis ([place]) 02/17/2015  . Chronic anticoagulation 02/17/2015  . Bilateral carotid artery disease ([place]) 09/12/2014  . Chronic systolic CHF (congestive heart failure), NYHA class 2 ([place]) 07/10/2014  . Encounter for therapeutic drug monitoring 01/02/2014  . Hypothyroidism 07/04/2013  . Asthma exacerbation, allergic 09/13/2012  . Occlusion and stenosis of carotid artery without mention of cerebral infarction  05/18/2012  . Adrenal insufficiency ([place]) 02/08/2012  . Thrombocytopenia ([place]) 02/07/2012  . COPD with asthma ([place]) 01/26/2012  . Anemia 11/26/2011  . Long term (current) use of anticoagulants 02/24/2011  . Atrial fibrillation ([place]) 01/12/2009  . ALLERGIC RHINITIS 02/24/2008  . HLD (hyperlipidemia) 06/15/2007  . Benign essential HTN 06/15/2007  . MYOCARDIAL INFARCTION, HX OF 06/15/2007  . Coronary atherosclerosis 06/15/2007  . TRANSIENT ISCHEMIC ATTACK, HX OF 06/15/2007    Past Medical History:  Diagnosis Date  . Angina    "jaw & elbow was the worse"  . Arrhythmia   . Atrial fibrillation ([place]) 03/2011  . Carotid artery occlusion    right total internal artery occlusion  . Coronary artery disease    s/p cardiac cath in 1990s, and cardiolite study  in 2005 showing EF 54%  . Degenerative disc disease    cervical spine  . GERD (gastroesophageal reflux disease)   . Hyperlipidemia   . Hypertension   . Hypothyroidism   . Myocardial infarction ([place]) 1992 and 1994  . Pneumonia ~ 1935  . Rheumatoid arthritis ([place])   . Skin cancer    forehead  . TIA (transient ischemic attack)   . Urgency incontinence     Past Surgical History:  Procedure Laterality Date  . AMPUTATION Right 02/21/2015   Procedure: Right 3rd Ray Amputation;  Surgeon: Newt Minion, MD;  Location: [facility];  Service: Orthopedics;  Laterality: Right;  Digital block and ankle block with MAC  . CARDIAC CATHETERIZATION  1990s  . CATARACT EXTRACTION W/ INTRAOCULAR LENS  IMPLANT, BILATERAL    . SKIN CANCER EXCISION  12/2011   forehead    Social History   Socioeconomic History  . Marital status: Married    Spouse name: Not on file  . Number of children: Not on file  . Years of education: Not on file  . Highest education level: Not on file  Social Needs  . Financial resource strain: Not on file  . Food insecurity - worry: Not on file  . Food insecurity - inability: Not on file  . Transportation needs - medical: Not  on file  . Transportation needs - non-medical: Not on file  Occupational History  . Not on file  Tobacco Use  . Smoking status: Former Smoker    Packs/day: 1.00    Years: 40.00    Pack years: 40.00    Types: Cigarettes    Last attempt to quit: 09/12/1989    Years since quitting: 28.2  . Smokeless tobacco: Former Systems developer    Quit date: 11/26/1990  . Tobacco comment: quit around 1992  Substance and Sexual Activity  . Alcohol use: No    Alcohol/week: 0.0 oz    Comment: former, quit drinking in 1992  . Drug use: No  . Sexual activity: Not Currently  Other Topics Concern  . 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 Heart disease Mother   . Malignant hyperthermia Mother   . Hypertension Mother   . Heart disease Father   . Malignant hyperthermia Father   . Hyperlipidemia Father   . Hypertension Father   . Heart disease Sister   . Hypertension Sister   . Heart disease Brother      Review of Systems  Constitutional: Negative for chills and fever.  HENT: Negative for sore throat.   Respiratory: Positive for cough. Negative for hemoptysis, shortness of breath and wheezing.   Cardiovascular: Negative for chest pain.  Gastrointestinal: Negative for abdominal pain, nausea and vomiting.  Musculoskeletal: Negative for myalgias.  Skin: Negative for rash.  Neurological: Negative for dizziness and headaches.  All other systems reviewed and are negative.   Vitals:   12/18/17 1721  BP: (!) 108/58  Pulse: 64  Resp: 16  Temp: 98.8 F (37.1 C)  SpO2: 98%    Physical Exam  Constitutional: He is oriented to person, place, and time. He appears well-developed and well-nourished.  HENT:  Head: Normocephalic and atraumatic.  Eyes: EOM are normal. Pupils are equal, round, and reactive to  light.  Neck: Normal range of motion. Neck supple.  Cardiovascular: Normal rate and regular rhythm.  Pulmonary/Chest: Effort normal and breath sounds normal.  Musculoskeletal: Normal range of motion.  Neurological: He is alert and oriented to person, place, and time.  Skin: Skin is warm. Capillary refill takes less than 2 seconds.  Psychiatric: He has a normal mood and affect. His behavior is normal.  Vitals reviewed.    ASSESSMENT & PLAN: Karlos was seen today for cough.  Diagnoses and all orders for this visit:  Cough  Lower respiratory infection -     amoxicillin-clavulanate (AUGMENTIN) 875-125 MG tablet; Take 1 tablet by mouth 2 (two) times daily for 7 days.    Patient Instructions       IF you received an x-ray today, you will receive an invoice from [facility]. Please contact [facility] at [phone] with questions or concerns regarding your invoice.   IF you received labwork today, you will receive an invoice from [facility]. Please contact [facility] at [phone] with questions or concerns regarding your invoice.   Our billing staff will not be able to assist you with questions regarding bills from these companies.  You will be contacted with the lab results as soon as they are available. The fastest way to get your results is to activate your My Chart account. Instructions are located on the last page of this paperwork. If you have not heard from [place] regarding the results in 2 weeks, please contact this office.     Cough, Adult A cough helps to clear your throat and lungs. A cough may last only 2-3 weeks (acute), or it may last longer than 8 weeks (chronic). Many different things can cause a cough. A cough may be a sign of an illness or another medical condition. Follow these instructions at home:  Pay attention to any changes in your cough.  Take medicines only as told by your doctor. ? If you were prescribed an antibiotic medicine, take  it as told by your doctor. Do not stop taking it even if you start to feel better. ? Talk with your doctor before you try using a cough medicine.  Drink enough fluid to keep your pee (urine) clear or pale  yellow.  If the air is dry, use a cold steam vaporizer or humidifier in your home.  Stay away from things that make you cough at work or at home.  If your cough is worse at night, try using extra pillows to raise your head up higher while you sleep.  Do not smoke, and try not to be around smoke. If you need help quitting, ask your doctor.  Do not have caffeine.  Do not drink alcohol.  Rest as needed. Contact a doctor if:  You have new problems (symptoms).  You cough up yellow fluid (pus).  Your cough does not get better after 2-3 weeks, or your cough gets worse.  Medicine does not help your cough and you are not sleeping well.  You have pain that gets worse or pain that is not helped with medicine.  You have a fever.  You are losing weight and you do not know why.  You have night sweats. Get help right away if:  You cough up blood.  You have trouble breathing.  Your heartbeat is very fast. This information is not intended to replace advice given to you by your health care provider. Make sure you discuss any questions you have with your health care provider. Document Released: 07/24/2011 Document Revised: 04/17/2016 Document Reviewed: 01/17/2015 Elsevier Interactive Patient Education  2018 Elsevier Inc.      Agustina Caroli, MD [facility]

## 2017-12-23 DIAGNOSIS — R748 Abnormal levels of other serum enzymes: Secondary | ICD-10-CM | POA: Diagnosis not present

## 2017-12-23 DIAGNOSIS — M1990 Unspecified osteoarthritis, unspecified site: Secondary | ICD-10-CM | POA: Diagnosis not present

## 2017-12-23 DIAGNOSIS — D649 Anemia, unspecified: Secondary | ICD-10-CM | POA: Diagnosis not present

## 2017-12-23 DIAGNOSIS — R21 Rash and other nonspecific skin eruption: Secondary | ICD-10-CM | POA: Diagnosis not present

## 2017-12-23 DIAGNOSIS — Z79899 Other long term (current) drug therapy: Secondary | ICD-10-CM | POA: Diagnosis not present

## 2017-12-23 DIAGNOSIS — M0579 Rheumatoid arthritis with rheumatoid factor of multiple sites without organ or systems involvement: Secondary | ICD-10-CM | POA: Diagnosis not present

## 2017-12-28 ENCOUNTER — Ambulatory Visit (INDEPENDENT_AMBULATORY_CARE_PROVIDER_SITE_OTHER): Payer: Medicare Other | Admitting: Cardiology

## 2017-12-28 ENCOUNTER — Encounter: Payer: Self-pay | Admitting: Cardiology

## 2017-12-28 VITALS — BP 140/70 | HR 63 | Ht 71.25 in | Wt 180.0 lb

## 2017-12-28 DIAGNOSIS — I4821 Permanent atrial fibrillation: Secondary | ICD-10-CM

## 2017-12-28 DIAGNOSIS — I5043 Acute on chronic combined systolic (congestive) and diastolic (congestive) heart failure: Secondary | ICD-10-CM

## 2017-12-28 DIAGNOSIS — I482 Chronic atrial fibrillation: Secondary | ICD-10-CM | POA: Diagnosis not present

## 2017-12-28 DIAGNOSIS — I350 Nonrheumatic aortic (valve) stenosis: Secondary | ICD-10-CM

## 2017-12-28 DIAGNOSIS — Z7901 Long term (current) use of anticoagulants: Secondary | ICD-10-CM | POA: Diagnosis not present
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
## 2017-12-28 NOTE — Patient Instructions (Signed)
Medication Instructions:   Your physician recommends that you continue on your current medications as directed. Please refer to the Current Medication list given to you today.    Labwork:  4 WEEKS SAME DAY (ON 01/29/18) AS YOU SEE DR Meda Coffee TO CHECK--BMET AND PRO-BNP     Follow-Up:  ON 01/29/18 AT 4:40 PM WITH DR NELSON---PLEASE HAVE LAB APPOINTMENT SAME DAY AS THIS VISIT       If you need a refill on your cardiac medications before your next appointment, please call your pharmacy.

## 2018-01-05 ENCOUNTER — Ambulatory Visit: Payer: TRICARE For Life (TFL) | Admitting: Podiatry

## 2018-01-13 ENCOUNTER — Ambulatory Visit (INDEPENDENT_AMBULATORY_CARE_PROVIDER_SITE_OTHER): Payer: Medicare Other | Admitting: General Practice

## 2018-01-13 DIAGNOSIS — I4891 Unspecified atrial fibrillation: Secondary | ICD-10-CM

## 2018-01-13 DIAGNOSIS — Z7901 Long term (current) use of anticoagulants: Secondary | ICD-10-CM

## 2018-01-13 LAB — POCT INR: INR: 1.7

## 2018-01-13 NOTE — Patient Instructions (Signed)
Pre visit review using our clinic review tool, if applicable. No additional management support is needed unless otherwise documented below in the visit note.  Take 2 tablets today (2/20) and then change dosage and take  1.5 tablets daily except 1 tablet on Sundays, and Thursdays.  Recheck in 3 to 4 weeks.

## 2018-01-13 NOTE — Progress Notes (Signed)
I have reviewed and agree with this plan  

## 2018-01-26 ENCOUNTER — Ambulatory Visit (INDEPENDENT_AMBULATORY_CARE_PROVIDER_SITE_OTHER): Payer: Medicare Other | Admitting: Podiatry

## 2018-01-26 ENCOUNTER — Encounter: Payer: Self-pay | Admitting: Podiatry

## 2018-01-26 DIAGNOSIS — B351 Tinea unguium: Secondary | ICD-10-CM

## 2018-01-26 DIAGNOSIS — D689 Coagulation defect, unspecified: Secondary | ICD-10-CM

## 2018-01-26 DIAGNOSIS — M79676 Pain in unspecified toe(s): Secondary | ICD-10-CM

## 2018-01-26 NOTE — Progress Notes (Signed)
Patient ID: [name] [name], male   DOB: 12-15-1924, 84 y.o.   MRN: [id number] HPI  Complaint:  Visit Type: Patient returns to my office for continued preventative foot care services. Complaint: Patient states" my nails have grown long and thick and become painful to walk and wear shoes" . He presents for preventative foot care services. No changes to ROS.  Patient says his callus are gone since he started wearing his new shoes.  Podiatric Exam: Vascular: dorsalis pedis and posterior tibial pulses are negative. Capillary return is immediate. Temperature gradient is negative. Skin turgor WNL,   Sensorium: Normal Semmes Weinstein monofilament test. Normal tactile sensation bilaterally.  Nail Exam: Pt has thick disfigured discolored nails with subungual debris noted bilateral entire nail hallux through fifth toenails Ulcer Exam: There is no evidence of ulcer or pre-ulcerative changes or infection. Orthopedic Exam: Muscle tone and strength are WNL. No limitations in general ROM. No crepitus or effusions noted. Foot type and digits show no abnormalities. Bony prominences are unremarkable. Amputation third toe right foot. Skin: No Porokeratosis. No infection or ulcers.   Diagnosis:  Tinea unguium, Pain in right toe, pain in left toes,  Treatment & Plan Procedures and Treatment: Consent by patient was obtained for treatment procedures. The patient understood the discussion of treatment and procedures well. All questions were answered thoroughly reviewed. Debridement of mycotic and hypertrophic toenails, 1 through 5 bilateral and clearing of subungual debris. No ulceration, no infection noted.  Return Visit-Office Procedure: Patient instructed to return to the office for a follow up visit 10 weeks for continued evaluation and treatment.  Gardiner Barefoot DPM

## 2018-01-29 ENCOUNTER — Other Ambulatory Visit: Payer: Medicare Other | Admitting: *Deleted

## 2018-01-29 ENCOUNTER — Encounter: Payer: Self-pay | Admitting: Cardiology

## 2018-01-29 ENCOUNTER — Ambulatory Visit (INDEPENDENT_AMBULATORY_CARE_PROVIDER_SITE_OTHER): Payer: Medicare Other | Admitting: Cardiology

## 2018-01-29 DIAGNOSIS — I10 Essential (primary) hypertension: Secondary | ICD-10-CM

## 2018-01-29 DIAGNOSIS — I5043 Acute on chronic combined systolic (congestive) and diastolic (congestive) heart failure: Secondary | ICD-10-CM | POA: Diagnosis not present

## 2018-01-29 DIAGNOSIS — I350 Nonrheumatic aortic (valve) stenosis: Secondary | ICD-10-CM

## 2018-01-29 DIAGNOSIS — E785 Hyperlipidemia, unspecified: Secondary | ICD-10-CM | POA: Diagnosis not present

## 2018-01-29 MED ORDER — METOLAZONE 2.5 MG PO TABS
2.5000 mg | ORAL_TABLET | ORAL | 1 refills | Status: DC
Start: 2018-01-29 — End: 2018-03-05

## 2018-01-29 MED ORDER — FUROSEMIDE 40 MG PO TABS: 40.0000 mg | ORAL_TABLET | Freq: Every day | ORAL | 3 refills | Status: DC

## 2018-01-29 NOTE — Patient Instructions (Addendum)
Medication Instructions:   START TAKING LASIX 40 MG ONCE DAILY  START TAKING METOLAZONE 2.5 MG THREE TIMES WEEKLY---TAKE ON Tuesday, Thursday, AND Saturday     Your physician has requested that you have an echocardiogram. Echocardiography is a painless test that uses sound waves to create images of your heart. It provides your doctor with information about the size and shape of your heart and how well your heart's chambers and valves are working. This procedure takes approximately one hour. There are no restrictions for this procedure. SCHEDULE ON 03/02/18   Labwork:  ON 03/02/18, SAME DAY AS YOU SEE DR Meda Coffee ---TO CHECK---BMET AND PRO-BNP     Follow-Up:  March 02, 2018 AT 4:40PM TO DR NELSONS SCHEDULED--OK PER DR NELSON--PLEASE HAVE LABS DONE SAME DAY       If you need a refill on your cardiac medications before your next appointment, please call your pharmacy.
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
## 2018-01-30 LAB — BASIC METABOLIC PANEL
BUN/Creatinine Ratio: 16 (ref 10–24)
BUN: 18 mg/dL (ref 10–36)
CO2: 29 mmol/L (ref 20–29)
Calcium: 9.3 mg/dL (ref 8.6–10.2)
Chloride: 100 mmol/L (ref 96–106)
Creatinine, Ser: 1.1 mg/dL (ref 0.76–1.27)
GFR calc Af Amer: 68 mL/min/{1.73_m2} (ref >59)
GFR calc non Af Amer: 59 mL/min/{1.73_m2} — ABNORMAL LOW (ref >59)
Glucose: 99 mg/dL (ref 65–99)
Potassium: 4.3 mmol/L (ref 3.5–5.2)
Sodium: 140 mmol/L (ref 134–144)

## 2018-01-30 LAB — PRO B NATRIURETIC PEPTIDE: NT-Pro BNP: 1923 pg/mL — ABNORMAL HIGH (ref 0–486)

## 2018-02-01 ENCOUNTER — Telehealth: Payer: Self-pay | Admitting: Cardiology

## 2018-02-01 NOTE — Telephone Encounter (Signed)
Left a message for the pts daughter to call back.  

## 2018-02-01 NOTE — Telephone Encounter (Signed)
Follow up:   Pt daughter is calling back to speak with a nurse.

## 2018-02-01 NOTE — Telephone Encounter (Signed)
Follow Up: ° ° °Returning your call,concerning his lab results. °

## 2018-02-01 NOTE — Telephone Encounter (Signed)
Notes recorded by Nuala Alpha, LPN on 03/26/5464 at 6:81 PM EDT Spoke with the pts Daughter [name] (on DPR), and informed her that per Dr Meda Coffee, the pts labs showed normal kidney function and electrolytes, improved BNP, and she recommends that he continue the regimen, as advised at the last OV.  Daughter verbalized understanding and agrees with this plan.

## 2018-02-10 ENCOUNTER — Ambulatory Visit (INDEPENDENT_AMBULATORY_CARE_PROVIDER_SITE_OTHER): Payer: Medicare Other | Admitting: General Practice

## 2018-02-10 DIAGNOSIS — Z7901 Long term (current) use of anticoagulants: Secondary | ICD-10-CM

## 2018-02-10 DIAGNOSIS — I4891 Unspecified atrial fibrillation: Secondary | ICD-10-CM

## 2018-02-10 LAB — POCT INR: INR: 1.2

## 2018-02-10 NOTE — Patient Instructions (Addendum)
Pre visit review using our clinic review tool, if applicable. No additional management support is needed unless otherwise documented below in the visit note.  Take 2 tablets today (3/20) and tomorrow (3/21) and then continue to take 1.5 tablets daily except 1 tablet on Sundays, and Thursdays.  Recheck in 3 to 4 weeks.

## 2018-02-11 NOTE — Progress Notes (Signed)
I agree with this plan.

## 2018-02-19 ENCOUNTER — Ambulatory Visit: Payer: Self-pay | Admitting: *Deleted

## 2018-02-19 NOTE — Telephone Encounter (Signed)
noted 

## 2018-02-19 NOTE — Telephone Encounter (Signed)
Pt states he has been having blood dripping from his nose a few times since last night. Pt states he has also had "match-sized" clots during periods of bleeding.  Pt states he notices the bleeding with blowing his nose or wiping his nose. Pt advised not to blow nose so that bleeding would not become worse.Pt states bleeding has stopped at this time. Pt denies any dizziness or feeling lightheaded. Pt is currently on coumadin and is being followed at the coumadin clinic.Pt advised that he needed to be seen in the next 24 hours to evaluate bleeding. Pt given home care advice to stop bleeding if it reoccurs and also advised to contact the office again if bleeding does not stop with home care measures or if he starts to feel dizzy or lightheaded. Pt verbalized understanding. Pt is not able to come to appt today in the office due to his daughters being at work and him not being able to drive at this time. Pt will contact his daughters to see if they are able to bring him to an appt at the Saturday clinic and will call back to schedule appt.   Reason for Disposition . Taking Coumadin (warfarin) or other strong blood thinner, or known bleeding disorder (e.g., thrombocytopenia)  Answer Assessment - Initial Assessment Questions 1. AMOUNT OF BLEEDING: "How bad is the bleeding?" "How much blood was lost?" "Has the bleeding stopped?"   - MILD: needed a couple tissues   - MODERATE: needed many tissues   - SEVERE: large blood clots, soaked many tissues, lasted more than 30 minutes      Moderate with match sized clost 2. ONSET: "When did the nosebleed start?"      Last night 3. FREQUENCY: "How many nosebleeds have you had in the last 24 hours?"      Has been dripping during the night  4. RECURRENT SYMPTOMS: "Have there been other recent nosebleeds?" If so, ask: "How long did it take you to stop the bleeding?" "What worked best?"      No 5. CAUSE: "What do you think caused this nosebleed?"     Unsure 6. LOCAL  FACTORS: "Do you have any cold symptoms?", "Have you been rubbing or picking at your nose?"   No  7. SYSTEMIC FACTORS: "Do you have high blood pressure or any bleeding problems?"     No 8. BLOOD THINNERS: "Do you take any blood thinners?" (e.g., coumadin, heparin, aspirin, Plavix)     coumadin 9. OTHER SYMPTOMS: "Do you have any other symptoms?" (e.g., lightheadedness)     No  Protocols used: NOSEBLEED-A-AH

## 2018-02-20 ENCOUNTER — Ambulatory Visit (INDEPENDENT_AMBULATORY_CARE_PROVIDER_SITE_OTHER): Payer: Medicare Other | Admitting: Family Medicine

## 2018-02-20 ENCOUNTER — Encounter: Payer: Self-pay | Admitting: Family Medicine

## 2018-02-20 VITALS — BP 132/74 | HR 62 | Temp 98.2°F | Wt 188.0 lb

## 2018-02-20 DIAGNOSIS — R040 Epistaxis: Secondary | ICD-10-CM

## 2018-02-20 NOTE — Progress Notes (Signed)
Subjective:  Patient ID: [name] [name], male    DOB: 1925-07-18  Age: 84 y.o. MRN: [id number]  CC: Epistaxis (on Thursday into Friday,  Pt on coumadin and concerned that if it starts again he won't be able to get stopped)   HPI [name] [name] presents for epistaxis. Was controlled with packing. Ho MI and severe carotid artery disease. Taking coumadin and asa. bp well controlled.  Outpatient Medications Prior to Visit  Medication Sig Dispense Refill  . aspirin 81 MG tablet Take 81 mg by mouth at bedtime.     Marland Kitchen atorvastatin (LIPITOR) 10 MG tablet TAKE 1 TABLET AT BEDTIME 90 tablet 2  . Calcium-Vitamin D (CALTRATE 600 PLUS-VIT D PO) Take 1 tablet by mouth at bedtime.     . digoxin (LANOXIN) 0.125 MG tablet TAKE 1 TABLET DAILY 90 tablet 2  . Fish Oil-Krill Oil (KRILL OIL PLUS PO) Take 1 tablet by mouth daily. Krill Oil-Omega 3- DHA-EPA 300-90 (25-36) mg    . folic acid (FOLVITE) 1 MG tablet     . furosemide (LASIX) 40 MG tablet Take 1 tablet (40 mg total) by mouth daily. 90 tablet 3  . levothyroxine (SYNTHROID, LEVOTHROID) 100 MCG tablet Take 100 mcg by mouth daily before breakfast.    . loperamide (IMODIUM) 2 MG capsule Take 1 capsule (2 mg total) by mouth as needed for diarrhea or loose stools (max 8 mg daily). 30 capsule 0  . methotrexate 2.5 MG tablet Take 10 mg by mouth once a week.    . metolazone (ZAROXOLYN) 2.5 MG tablet Take 1 tablet (2.5 mg total) by mouth 3 (three) times a week. Take on Tuesday, Thursday, Saturday. 30 tablet 1  . metoprolol succinate (TOPROL-XL) 25 MG 24 hr tablet Take 0.5 tablets (12.5 mg total) by mouth daily. Take with or immediately following a meal. 45 tablet 3  . Misc Natural Products (PROSTATE HEALTH PO) Take 2 tablets by mouth daily. 160-100-100    . nitroGLYCERIN (NITROSTAT) 0.4 MG SL tablet Place 1 tablet (0.4 mg total) under the tongue every 5 (five) minutes as needed for chest pain. 25 tablet 0  . Pediatric Multivitamins-Fl (MULTIPLE  VITAMINS/FLUORIDE) 1 MG CHEW Chew 1 tablet by mouth daily.    Marland Kitchen spironolactone (ALDACTONE) 25 MG tablet Take 25 mg by mouth daily.    Marland Kitchen warfarin (COUMADIN) 5 MG tablet TAKE AS DIRECTED BY ANTICOAGULATION CLINIC 120 tablet 1  . Multiple Vitamins-Minerals (OCUVITE PO) Take 1 tablet by mouth daily.     No facility-administered medications prior to visit.     ROS Review of Systems  Constitutional: Negative for chills, fever and unexpected weight change.  HENT: Positive for nosebleeds. Negative for postnasal drip, rhinorrhea, sinus pressure, sinus pain and sneezing.   Eyes: Negative.   Respiratory: Negative.   Cardiovascular: Negative.   Gastrointestinal: Negative.   Psychiatric/Behavioral: Negative.     Objective:  BP 132/74 (BP Location: Left Arm, Patient Position: Sitting, Cuff Size: Normal)   Pulse 62   Temp 98.2 F (36.8 C) (Oral)   Wt 188 lb (85.3 kg)   SpO2 97%   BMI 26.04 kg/m   BP Readings from Last 3 Encounters:  02/20/18 132/74  01/29/18 130/84  12/28/17 140/70    Wt Readings from Last 3 Encounters:  02/20/18 188 lb (85.3 kg)  01/29/18 172 lb (78 kg)  12/28/17 180 lb (81.6 kg)    Physical Exam  Constitutional: He is oriented to person, place, and time. He appears well-developed  and well-nourished. No distress.  HENT:  Head: Normocephalic and atraumatic.  Right Ear: External ear normal.  Left Ear: External ear normal.  Nose:    Mouth/Throat: Oropharynx is clear and moist. No oropharyngeal exudate.  Eyes: Right eye exhibits no discharge. Left eye exhibits no discharge. No scleral icterus.  Neck: Neck supple. No tracheal deviation present.  Pulmonary/Chest: Effort normal.  Neurological: He is alert and oriented to person, place, and time.  Skin: Skin is warm and dry. He is not diaphoretic.  Psychiatric: He has a normal mood and affect. His behavior is normal.    Lab Results  Component Value Date   WBC 6.1 12/07/2017   HGB 11.0 (L) 05/05/2017   HCT  35.0 (L) 12/07/2017   PLT 206 12/07/2017   GLUCOSE 99 01/29/2018   CHOL 106 05/23/2015   TRIG 89.0 05/23/2015   HDL 34.40 (L) 05/23/2015   LDLCALC 54 05/23/2015   ALT 30 12/07/2017   AST 34 12/07/2017   NA 140 01/29/2018   K 4.3 01/29/2018   CL 100 01/29/2018   CREATININE 1.10 01/29/2018   BUN 18 01/29/2018   CO2 29 01/29/2018   TSH 3.85 05/18/2015   PSA 0.48 05/18/2015   INR 1.2 02/10/2018   HGBA1C (H) 03/31/2010    5.9 (NOTE)                                                                       According to the ADA Clinical Practice Recommendations for 2011, when HbA1c is used as a screening test:   >=6.5%   Diagnostic of Diabetes Mellitus           (if abnormal result  is confirmed)  5.7-6.4%   Increased risk of developing Diabetes Mellitus  References:Diagnosis and Classification of Diabetes Mellitus,Diabetes ZOXW,9604,54(UJWJX 1):S62-S69 and Standards of Medical Care in         Diabetes - 2011,Diabetes Care,2011,34  (Suppl 1):S11-S61.    No results found.  Assessment & Plan:   Montrey was seen today for epistaxis.  Diagnoses and all orders for this visit:  Epistaxis  enouraged copious use of nasal salin. Moist packing okayed. Moist packing under upper lip. Nasal decongestants used sparingly. I have discontinued Juanda Crumble T. Riggle's Multiple Vitamins-Minerals (OCUVITE PO). I am also having him maintain his aspirin, Calcium-Vitamin D (CALTRATE 600 PLUS-VIT D PO), loperamide, Fish Oil-Krill Oil (KRILL OIL PLUS PO), MULTIPLE VITAMINS/FLUORIDE, Misc Natural Products (PROSTATE HEALTH PO), spironolactone, folic acid, nitroGLYCERIN, digoxin, atorvastatin, metoprolol succinate, warfarin, methotrexate, levothyroxine, furosemide, and metolazone.  No orders of the defined types were placed in this encounter.    Follow-up: No follow-ups on file.  Libby Maw, MD

## 2018-02-20 NOTE — Patient Instructions (Signed)
Nosebleed, Adult A nosebleed is when blood comes out of the nose. Nosebleeds are common. Usually, they are not a sign of a serious condition. Nosebleeds can happen if a small blood vessel in your nose starts to bleed or if the lining of your nose (mucous membrane) cracks. They are commonly caused by:  Allergies.  Colds.  Picking your nose.  Blowing your nose too hard.  An injury from sticking an object into your nose or getting hit in the nose.  Dry or cold air.  Less common causes of nosebleeds include:  Toxic fumes.  Something abnormal in the nose or in the air-filled spaces in the bones of the face (sinuses).  Growths in the nose, such as polyps.  Medicines or conditions that cause blood to clot slowly.  Certain illnesses or procedures that irritate or dry out the nasal passages.  Follow these instructions at home: When you have a nosebleed:  Sit down and tilt your head slightly forward.  Use a clean towel or tissue to pinch your nostrils under the bony part of your nose. After 10 minutes, let go of your nose and see if bleeding starts again. Do not release pressure before that time. If there is still bleeding, repeat the pinching and holding for 10 minutes until the bleeding stops.  Do not place tissues or gauze in the nose to stop bleeding.  Avoid lying down and avoid tilting your head backward. That may make blood collect in the throat and cause gagging or coughing.  Use a nasal spray decongestant to help with a nosebleed as told by your health care provider.  Do not use petroleum jelly or mineral oil in your nose. It can drip into your lungs. After a nosebleed:  Avoid blowing your nose or sniffing for a number of hours.  Avoid straining, lifting, or bending at the waist for several days. You may resume other normal activities as you are able.  Use saline spray or a humidifier as told by your health care provider.  Aspirinand blood thinners make bleeding more  likely. If you are prescribed these medicines and you suffer from nosebleeds: ? Ask your health care provider if you should stop taking the medicines or if you should adjust the dose. ? Do not stop taking medicines that your health care provider has recommended unless told by your health care provider.  If your nosebleed was caused by dry mucous membranes, use over-the-counter saline nasal spray or gel. This will keep the mucous membranes moist and allow them to heal. If you must use a lubricant: ? Choose one that is water-soluble. ? Use only as much as you need and use it only as often as needed. ? Do not lie down until several hours after you use it. Contact a health care provider if:  You have a fever.  You get nosebleeds often or more often than usual.  You bruise very easily.  You have a nosebleed from having something stuck in your nose.  You have bleeding in your mouth.  You vomit or cough up brown material.  You have a nosebleed after you start a new medicine. Get help right away if:  You have a nosebleed after a fall or a head injury.  Your nosebleed does not go away after 20 minutes.  You feel dizzy or weak.  You have unusual bleeding from other parts of your body.  You have unusual bruising on other parts of your body.  You become sweaty.    You vomit blood. This information is not intended to replace advice given to you by your health care provider. Make sure you discuss any questions you have with your health care provider. Document Released: 08/20/2005 Document Revised: 07/10/2016 Document Reviewed: 05/27/2016 Elsevier Interactive Patient Education  2018 [employer] Inc[employer]  

## 2018-03-02 ENCOUNTER — Ambulatory Visit (HOSPITAL_COMMUNITY): Payer: Medicare Other | Attending: Cardiovascular Disease

## 2018-03-02 ENCOUNTER — Ambulatory Visit (INDEPENDENT_AMBULATORY_CARE_PROVIDER_SITE_OTHER): Payer: Medicare Other | Admitting: Cardiology

## 2018-03-02 ENCOUNTER — Encounter: Payer: Self-pay | Admitting: Cardiology

## 2018-03-02 ENCOUNTER — Other Ambulatory Visit: Payer: Self-pay

## 2018-03-02 ENCOUNTER — Other Ambulatory Visit: Payer: Medicare Other

## 2018-03-02 VITALS — BP 142/66 | HR 62 | Ht 71.0 in | Wt 178.0 lb

## 2018-03-02 DIAGNOSIS — E782 Mixed hyperlipidemia: Secondary | ICD-10-CM | POA: Diagnosis not present

## 2018-03-02 DIAGNOSIS — I252 Old myocardial infarction: Secondary | ICD-10-CM | POA: Insufficient documentation

## 2018-03-02 DIAGNOSIS — I2510 Atherosclerotic heart disease of native coronary artery without angina pectoris: Secondary | ICD-10-CM | POA: Diagnosis not present

## 2018-03-02 DIAGNOSIS — I350 Nonrheumatic aortic (valve) stenosis: Secondary | ICD-10-CM | POA: Diagnosis not present

## 2018-03-02 DIAGNOSIS — I10 Essential (primary) hypertension: Secondary | ICD-10-CM | POA: Diagnosis not present

## 2018-03-02 DIAGNOSIS — I119 Hypertensive heart disease without heart failure: Secondary | ICD-10-CM | POA: Diagnosis not present

## 2018-03-02 DIAGNOSIS — I4891 Unspecified atrial fibrillation: Secondary | ICD-10-CM | POA: Insufficient documentation

## 2018-03-02 DIAGNOSIS — E785 Hyperlipidemia, unspecified: Secondary | ICD-10-CM | POA: Insufficient documentation

## 2018-03-02 DIAGNOSIS — I5033 Acute on chronic diastolic (congestive) heart failure: Secondary | ICD-10-CM

## 2018-03-02 DIAGNOSIS — I509 Heart failure, unspecified: Secondary | ICD-10-CM | POA: Diagnosis not present

## 2018-03-02 DIAGNOSIS — I482 Chronic atrial fibrillation: Secondary | ICD-10-CM

## 2018-03-02 DIAGNOSIS — I4821 Permanent atrial fibrillation: Secondary | ICD-10-CM

## 2018-03-02 DIAGNOSIS — Z7901 Long term (current) use of anticoagulants: Secondary | ICD-10-CM

## 2018-03-02 DIAGNOSIS — Z87891 Personal history of nicotine dependence: Secondary | ICD-10-CM | POA: Diagnosis not present

## 2018-03-02 DIAGNOSIS — I5043 Acute on chronic combined systolic (congestive) and diastolic (congestive) heart failure: Secondary | ICD-10-CM | POA: Diagnosis not present

## 2018-03-02 DIAGNOSIS — I083 Combined rheumatic disorders of mitral, aortic and tricuspid valves: Secondary | ICD-10-CM | POA: Diagnosis not present

## 2018-03-02 NOTE — Patient Instructions (Signed)
Medication Instructions:   Your physician recommends that you continue on your current medications as directed. Please refer to the Current Medication list given to you today.   Labwork:  AS PREVIOUSLY SCHEDULED FOR TODAY TO CHECK A BMET AND PRO-BNP     You have been referred for TAVR CONSULT        Follow-Up:  NEW PATIENT CONSULT FOR TAVR          If you need a refill on your cardiac medications before your next appointment, please call your pharmacy.
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
## 2018-03-03 ENCOUNTER — Ambulatory Visit (INDEPENDENT_AMBULATORY_CARE_PROVIDER_SITE_OTHER): Payer: Medicare Other | Admitting: General Practice

## 2018-03-03 DIAGNOSIS — Z7901 Long term (current) use of anticoagulants: Secondary | ICD-10-CM | POA: Diagnosis not present

## 2018-03-03 DIAGNOSIS — I4891 Unspecified atrial fibrillation: Secondary | ICD-10-CM

## 2018-03-03 LAB — BASIC METABOLIC PANEL
BUN/Creatinine Ratio: 16 (ref 10–24)
BUN: 17 mg/dL (ref 10–36)
CO2: 31 mmol/L — ABNORMAL HIGH (ref 20–29)
Calcium: 9.2 mg/dL (ref 8.6–10.2)
Chloride: 96 mmol/L (ref 96–106)
Creatinine, Ser: 1.05 mg/dL (ref 0.76–1.27)
GFR calc Af Amer: 72 mL/min/{1.73_m2} (ref >59)
GFR calc non Af Amer: 62 mL/min/{1.73_m2} (ref >59)
Glucose: 96 mg/dL (ref 65–99)
Potassium: 3.5 mmol/L (ref 3.5–5.2)
Sodium: 140 mmol/L (ref 134–144)

## 2018-03-03 LAB — POCT INR: INR: 2.5

## 2018-03-03 LAB — PRO B NATRIURETIC PEPTIDE: NT-Pro BNP: 2507 pg/mL — ABNORMAL HIGH (ref 0–486)

## 2018-03-03 NOTE — Patient Instructions (Addendum)
Pre visit review using our clinic review tool, if applicable. No additional management support is needed unless otherwise documented below in the visit note.  Continue to take 1.5 tablets daily except 1 tablet on Sundays, and Thursdays.  Recheck in 3 to 4 weeks. (Please stop all vitamin supplements until INR is regulated).

## 2018-03-03 NOTE — Progress Notes (Signed)
I have reviewed and agree with this plan  

## 2018-03-05 ENCOUNTER — Other Ambulatory Visit: Payer: Self-pay

## 2018-03-05 DIAGNOSIS — I5043 Acute on chronic combined systolic (congestive) and diastolic (congestive) heart failure: Secondary | ICD-10-CM

## 2018-03-05 DIAGNOSIS — E785 Hyperlipidemia, unspecified: Secondary | ICD-10-CM

## 2018-03-05 MED ORDER — METOLAZONE 2.5 MG PO TABS: 2.5000 mg | ORAL_TABLET | ORAL | 3 refills | Status: DC

## 2018-03-18 ENCOUNTER — Telehealth: Payer: Self-pay | Admitting: Family Medicine

## 2018-03-18 NOTE — Telephone Encounter (Signed)
Copied from [facility] [id number]. Topic: Quick Communication - See Telephone Encounter >> Mar 17, 2018  4:05 PM Neva Seat wrote: [facility] - [phone]  Checking to see if the DME form was received by fax and the status. Please call Gerald Stabs back asap.

## 2018-03-18 NOTE — Telephone Encounter (Signed)
[facility] checking status again, call back [phone], [name] [name]

## 2018-03-18 NOTE — Telephone Encounter (Signed)
Returned call.  Went to a voicemail that said, "[name]."  No other information given about company etc...  No information left.  Will try again at a later time.

## 2018-03-18 NOTE — Telephone Encounter (Signed)
Spoke to [place].  Prescription was for a back brace.  Advised [name] that [name] [name] does not prescribe to third party organizations for ortho equipment.  If a back brace is needed then the pt should come see [name] [name] and be evaluated.  No further action required.

## 2018-03-31 ENCOUNTER — Ambulatory Visit: Payer: Medicare Other

## 2018-04-07 ENCOUNTER — Ambulatory Visit: Payer: Medicare Other | Admitting: Hematology

## 2018-04-07 ENCOUNTER — Ambulatory Visit (INDEPENDENT_AMBULATORY_CARE_PROVIDER_SITE_OTHER): Payer: Medicare Other | Admitting: General Practice

## 2018-04-07 ENCOUNTER — Other Ambulatory Visit: Payer: Medicare Other

## 2018-04-07 DIAGNOSIS — I4891 Unspecified atrial fibrillation: Secondary | ICD-10-CM

## 2018-04-07 DIAGNOSIS — Z7901 Long term (current) use of anticoagulants: Secondary | ICD-10-CM

## 2018-04-07 LAB — POCT INR: INR: 2.2

## 2018-04-07 NOTE — Patient Instructions (Addendum)
Pre visit review using our clinic review tool, if applicable. No additional management support is needed unless otherwise documented below in the visit note.  Continue to take 1.5 tablets daily except 1 tablet on Sundays, and Thursdays.  Re-check in 4 weeks.

## 2018-04-07 NOTE — Progress Notes (Signed)
[facility]  HEMATOLOGY ONCOLOGY PROGRESS NOTE  Date of service: 04/08/18  Patient Care Team: [name], NP as PCP - General (Family Medicine)  CC: f/u for Anemia  INTERVAL HISTORY:  [name] [name] is here for followup his multifactorial anemia. The patient's last visit with [place] was on 12/07/17. He is accompanied today by his daughter. The pt reports that he is doing well overall.   The pt reports that his RA is stable and he continues on 87m Methotrexate and Folic acid as well.   The pt notes some leg swelling and is looking at getting a valve replacement with cardiology soon.  Lab results today (04/08/18) of CBC, CMP, and Reticulocytes is as follows: all values are WNL except for RBC at 3.29, Hgb at 11.0, HCT at 33.1, MCV at 100.6, Lymphs Abs at 0.8k, Potassium at 3.4, Chloride at 97, CO2 at 33, Total Protein at 6.1, AST at 38.  Iron/TIBC 04/08/18 shows Sat Ratio at 16% Ferritin 04/08/18 is WNL at 120.   On review of systems, pt reports stable RA, leg swelling, stable energy levels, and denies bleeding problems, abdominal pains, and any other symptoms.    REVIEW OF SYSTEMS:    A 10+ POINT REVIEW OF SYSTEMS WAS OBTAINED including neurology, dermatology, psychiatry, cardiac, respiratory, lymph, extremities, GI, GU, Musculoskeletal, constitutional, breasts, reproductive, HEENT.  All pertinent positives are noted in the HPI.  All others are negative.   . Past Medical History:  Diagnosis Date  . Angina    "jaw & elbow was the worse"  . Arrhythmia   . Atrial fibrillation ([place] 03/2011  . Carotid artery occlusion    right total internal artery occlusion  . CHF (congestive heart failure) ([place]   . Coronary artery disease    s/p cardiac cath in 1990s, and cardiolite study  in 2005 showing EF 54%  . Degenerative disc disease    cervical spine  . GERD (gastroesophageal reflux disease)   . Heart murmur   . Hyperlipidemia   . Hypertension   . Hypothyroidism   . Myocardial infarction  ([place] 1992 and 1994  . Pneumonia ~ 1935  . Rheumatoid arthritis ([place]   . Skin cancer    forehead  . TIA (transient ischemic attack)   . Urgency incontinence     . Past Surgical History:  Procedure Laterality Date  . AMPUTATION Right 02/21/2015   Procedure: Right 3rd Ray Amputation;  Surgeon: MNewt Minion MD;  [place] [facility] [facility] [place]  Service: Orthopedics;  Laterality: Right;  Digital block and ankle block with MAC  . CARDIAC CATHETERIZATION  1990s  . CATARACT EXTRACTION W/ INTRAOCULAR LENS  IMPLANT, BILATERAL    . EYE SURGERY    . SKIN CANCER EXCISION  12/2011   forehead    . Social History   Tobacco Use  . Smoking status: Former Smoker    Packs/day: 1.00    Years: 40.00    Pack years: 40.00    Types: Cigarettes    Last attempt to quit: 09/12/1989    Years since quitting: 28.5  . Smokeless tobacco: Former USystems developer   Quit date: 11/26/1990  . Tobacco comment: quit around 1992  Substance Use Topics  . Alcohol use: No    Alcohol/week: 0.0 oz    Comment: former, quit drinking in 1992  . Drug use: No    ALLERGIES:  is allergic to levofloxacin and klor-con [potassium chloride er].  MEDICATIONS:  Current Outpatient Medications  Medication Sig Dispense Refill  .  aspirin 81 MG tablet Take 81 mg by mouth at bedtime.     Marland Kitchen atorvastatin (LIPITOR) 10 MG tablet Take 10 mg by mouth at bedtime.    . Calcium-Vitamin D (CALTRATE 600 PLUS-VIT D PO) Take 1 tablet by mouth at bedtime.     . digoxin (LANOXIN) 0.125 MG tablet Take 0.125 mg by mouth daily.    . Fish Oil-Krill Oil (KRILL OIL PLUS PO) Take 1 tablet by mouth daily. Krill Oil-Omega 3- DHA-EPA 300-90 (37-34) mg    . folic acid (FOLVITE) 1 MG tablet Take 1 mg by mouth daily.     . furosemide (LASIX) 40 MG tablet Take 1 tablet (40 mg total) by mouth daily. 90 tablet 3  . levothyroxine (SYNTHROID, LEVOTHROID) 100 MCG tablet Take 100 mcg by mouth daily before breakfast.    . loperamide (IMODIUM) 2 MG capsule Take 1 capsule (2 mg total) by  mouth as needed for diarrhea or loose stools (max 8 mg daily). 30 capsule 0  . methotrexate 2.5 MG tablet Take 10 mg by mouth once a week.    . metolazone (ZAROXOLYN) 2.5 MG tablet Take 1 tablet (2.5 mg total) by mouth 3 (three) times a week. Take on Tuesday, Thursday, Saturday. 36 tablet 3  . metoprolol succinate (TOPROL-XL) 25 MG 24 hr tablet Take 0.5 tablets (12.5 mg total) by mouth daily. Take with or immediately following a meal. 45 tablet 3  . Misc Natural Products (PROSTATE HEALTH PO) Take 2 tablets by mouth daily. 160-100-100    . nitroGLYCERIN (NITROSTAT) 0.4 MG SL tablet Place 1 tablet (0.4 mg total) under the tongue every 5 (five) minutes as needed for chest pain. 25 tablet 0  . Pediatric Multivitamins-Fl (MULTIPLE VITAMINS/FLUORIDE) 1 MG CHEW Chew 1 tablet by mouth daily.    Marland Kitchen spironolactone (ALDACTONE) 25 MG tablet Take 25 mg by mouth daily.    Marland Kitchen warfarin (COUMADIN) 5 MG tablet TAKE AS DIRECTED BY ANTICOAGULATION CLINIC 120 tablet 1   No current facility-administered medications for this visit.     PHYSICAL EXAMINATION:  ECOG PERFORMANCE STATUS: 1 - Symptomatic but completely ambulatory  . Vitals:   04/08/18 1430  BP: 129/72  Pulse: 72  Resp: 18  Temp: 98.2 F (36.8 C)  SpO2: 99%    Filed Weights   04/08/18 1430  Weight: 188 lb 12.8 oz (85.6 kg)   .Body mass index is 26.33 kg/m.  GENERAL:alert, in no acute distress and comfortable SKIN: no acute rashes, no significant lesions EYES: conjunctiva are pink and non-injected, sclera anicteric OROPHARYNX: MMM, no exudates, no oropharyngeal erythema or ulceration NECK: supple, no JVD LYMPH:  no palpable lymphadenopathy in the cervical, axillary or inguinal regions LUNGS: clear to auscultation b/l with normal respiratory effort HEART: regular rate & rhythm ABDOMEN:  normoactive bowel sounds , non tender, not distended. Extremity: no pedal edema PSYCH: alert & oriented x 3 with fluent speech NEURO: no focal  motor/sensory deficits   LABORATORY DATA:    CBC Latest Ref Rng & Units 04/08/2018 12/07/2017 05/05/2017  WBC 4.0 - 10.3 K/uL 7.0 6.1 6.0  Hemoglobin 13.0 - 17.1 g/dL 11.0(L) 11.6(L) 11.0(L)  Hematocrit 38.4 - 49.9 % 33.1(L) 35.0(L) 33.7(L)  Platelets 140 - 400 K/uL 189 206 201   . CBC    Component Value Date/Time   WBC 7.0 04/08/2018 1317   WBC 6.0 05/05/2017 1437   WBC 7.5 01/07/2016 1402   RBC 3.29 (L) 04/08/2018 1317   RBC 3.29 (L) 04/08/2018 1317  HGB 11.0 (L) 04/08/2018 1317   HGB 11.0 (L) 05/05/2017 1437   HCT 33.1 (L) 04/08/2018 1317   HCT 33.7 (L) 05/05/2017 1437   PLT 189 04/08/2018 1317   PLT 201 05/05/2017 1437   MCV 100.6 (H) 04/08/2018 1317   MCV 96.8 05/05/2017 1437   MCH 33.4 04/08/2018 1317   MCHC 33.2 04/08/2018 1317   RDW 14.3 04/08/2018 1317   RDW 15.3 (H) 05/05/2017 1437   LYMPHSABS 0.8 (L) 04/08/2018 1317   LYMPHSABS 1.0 05/05/2017 1437   MONOABS 0.8 04/08/2018 1317   MONOABS 0.4 05/05/2017 1437   EOSABS 0.3 04/08/2018 1317   EOSABS 0.4 05/05/2017 1437   BASOSABS 0.0 04/08/2018 1317   BASOSABS 0.0 05/05/2017 1437    . CMP Latest Ref Rng & Units 04/08/2018 03/02/2018 01/29/2018  Glucose 70 - 140 mg/dL 84 96 99  BUN 7 - 26 mg/dL 21 17 18   Creatinine 0.70 - 1.30 mg/dL 1.10 1.05 1.10  Sodium 136 - 145 mmol/L 136 140 140  Potassium 3.5 - 5.1 mmol/L 3.4(L) 3.5 4.3  Chloride 98 - 109 mmol/L 97(L) 96 100  CO2 22 - 29 mmol/L 33(H) 31(H) 29  Calcium 8.4 - 10.4 mg/dL 9.2 9.2 9.3  Total Protein 6.4 - 8.3 g/dL 6.1(L) - -  Total Bilirubin 0.2 - 1.2 mg/dL 0.6 - -  Alkaline Phos 40 - 150 U/L 50 - -  AST 5 - 34 U/L 38(H) - -  ALT 0 - 55 U/L 29 - -   . Lab Results  Component Value Date   IRON 43 04/08/2018   TIBC 275 04/08/2018   IRONPCTSAT 16 (L) 04/08/2018   (Iron and TIBC)  Lab Results  Component Value Date   FERRITIN 120 04/08/2018   .  RADIOGRAPHIC STUDIES: I have personally reviewed the radiological images as listed and agreed with the  findings in the report. No results found.  ASSESSMENT & PLAN:   84 yo with   1) Multifactorial Normocytic Anemia. This appears to be primarily related to anemia of chronic inflammation related to his rheumatoid arthritis along with perhaps some element of functional iron deficiency. It was also partially related to his methotrexate which he is off of since November 2017 but has been put back on.   Cannot rule out an element of low level MDS. -monitoring for GI bleeding on anticoagulation. -if Hgb <9 despite ferritin >100 -- might need to consider adjusting MTX and considering EPO - not indicated currently. -would recommend atleast 61m of folic acid daily while on MTX. -Discussed pt labwork today, 04/08/18; Hgb stable at 11.0, blood chemistries are stable.  -Recommend taking a B complex and continuing folic acid -Ferritin at 120 today 04/08/18 - no indication for additional IV Iron at this time.  RTC with Dr KIrene Limboin 6 months with labs   The toal time spent in the appt was 15 minutes and more than 50% was on counseling and direct patient cares.    GSullivan LoneMD MYardvilleAAHIVMS SEncompass Health Rehabilitation Hospital Of HendersonCCuyuna Regional Medical CenterHematology/Oncology Physician CC[facility] (Office):       [phone](Work cell):  [phone](Fax):           [phone] This document serves as a record of services personally performed by GSullivan Lone MD. It was created on his behalf by SBaldwin Jamaica a trained medical scribe. The creation of this record is based on the scribe's personal observations and the provider's statements to them.   .I have reviewed  the above documentation for accuracy and completeness, and I agree with the above. Brunetta Genera MD MS

## 2018-04-08 ENCOUNTER — Inpatient Hospital Stay: Payer: Medicare Other | Attending: Hematology

## 2018-04-08 ENCOUNTER — Telehealth: Payer: Self-pay | Admitting: Hematology

## 2018-04-08 ENCOUNTER — Inpatient Hospital Stay (HOSPITAL_BASED_OUTPATIENT_CLINIC_OR_DEPARTMENT_OTHER): Payer: Medicare Other | Admitting: Hematology

## 2018-04-08 ENCOUNTER — Encounter: Payer: Self-pay | Admitting: Hematology

## 2018-04-08 VITALS — BP 129/72 | HR 72 | Temp 98.2°F | Resp 18 | Ht 71.0 in | Wt 188.8 lb

## 2018-04-08 DIAGNOSIS — M7989 Other specified soft tissue disorders: Secondary | ICD-10-CM | POA: Insufficient documentation

## 2018-04-08 DIAGNOSIS — Z85828 Personal history of other malignant neoplasm of skin: Secondary | ICD-10-CM | POA: Diagnosis not present

## 2018-04-08 DIAGNOSIS — D638 Anemia in other chronic diseases classified elsewhere: Secondary | ICD-10-CM | POA: Diagnosis present

## 2018-04-08 DIAGNOSIS — I4891 Unspecified atrial fibrillation: Secondary | ICD-10-CM | POA: Insufficient documentation

## 2018-04-08 DIAGNOSIS — Z7901 Long term (current) use of anticoagulants: Secondary | ICD-10-CM | POA: Diagnosis not present

## 2018-04-08 DIAGNOSIS — I252 Old myocardial infarction: Secondary | ICD-10-CM

## 2018-04-08 DIAGNOSIS — D508 Other iron deficiency anemias: Secondary | ICD-10-CM

## 2018-04-08 DIAGNOSIS — M069 Rheumatoid arthritis, unspecified: Secondary | ICD-10-CM | POA: Diagnosis not present

## 2018-04-08 DIAGNOSIS — Z87891 Personal history of nicotine dependence: Secondary | ICD-10-CM

## 2018-04-08 DIAGNOSIS — I10 Essential (primary) hypertension: Secondary | ICD-10-CM | POA: Insufficient documentation

## 2018-04-08 LAB — RETICULOCYTES
RBC.: 3.29 MIL/uL — AB (ref 4.20–5.82)
RETIC COUNT ABSOLUTE: 46.1 10*3/uL (ref 34.8–93.9)
Retic Ct Pct: 1.4 % (ref 0.8–1.8)

## 2018-04-08 LAB — CMP (CANCER CENTER ONLY)
ALT: 29 U/L (ref 0–55)
AST: 38 U/L — ABNORMAL HIGH (ref 5–34)
Albumin: 3.6 g/dL (ref 3.5–5.0)
Alkaline Phosphatase: 50 U/L (ref 40–150)
Anion gap: 6 (ref 3–11)
BUN: 21 mg/dL (ref 7–26)
CHLORIDE: 97 mmol/L — AB (ref 98–109)
CO2: 33 mmol/L — AB (ref 22–29)
Calcium: 9.2 mg/dL (ref 8.4–10.4)
Creatinine: 1.1 mg/dL (ref 0.70–1.30)
GFR, EST NON AFRICAN AMERICAN: 57 mL/min — AB (ref >60)
Glucose, Bld: 84 mg/dL (ref 70–140)
POTASSIUM: 3.4 mmol/L — AB (ref 3.5–5.1)
SODIUM: 136 mmol/L (ref 136–145)
Total Bilirubin: 0.6 mg/dL (ref 0.2–1.2)
Total Protein: 6.1 g/dL — ABNORMAL LOW (ref 6.4–8.3)

## 2018-04-08 LAB — CBC WITH DIFFERENTIAL (CANCER CENTER ONLY)
BASOS ABS: 0 10*3/uL (ref 0.0–0.1)
Basophils Relative: 0 %
EOS PCT: 4 %
Eosinophils Absolute: 0.3 10*3/uL (ref 0.0–0.5)
HCT: 33.1 % — ABNORMAL LOW (ref 38.4–49.9)
Hemoglobin: 11 g/dL — ABNORMAL LOW (ref 13.0–17.1)
Lymphocytes Relative: 11 %
Lymphs Abs: 0.8 10*3/uL — ABNORMAL LOW (ref 0.9–3.3)
MCH: 33.4 pg (ref 27.2–33.4)
MCHC: 33.2 g/dL (ref 32.0–36.0)
MCV: 100.6 fL — ABNORMAL HIGH (ref 79.3–98.0)
MONO ABS: 0.8 10*3/uL (ref 0.1–0.9)
Monocytes Relative: 11 %
NEUTROS PCT: 74 %
Neutro Abs: 5.1 10*3/uL (ref 1.5–6.5)
PLATELETS: 189 10*3/uL (ref 140–400)
RBC: 3.29 MIL/uL — ABNORMAL LOW (ref 4.20–5.82)
RDW: 14.3 % (ref 11.0–14.6)
WBC: 7 10*3/uL (ref 4.0–10.3)

## 2018-04-08 LAB — FERRITIN: FERRITIN: 120 ng/mL (ref 22–316)

## 2018-04-08 LAB — IRON AND TIBC
IRON: 43 ug/dL (ref 42–163)
Saturation Ratios: 16 % — ABNORMAL LOW (ref 42–163)
TIBC: 275 ug/dL (ref 202–409)
UIBC: 232 ug/dL

## 2018-04-08 NOTE — Progress Notes (Signed)
I have reviewed and agree with this plan  

## 2018-04-08 NOTE — Telephone Encounter (Signed)
Scheduled appt per 5/16 los - Gave patient aVS and calender per los.  

## 2018-04-13 ENCOUNTER — Ambulatory Visit: Payer: Medicare Other | Admitting: Podiatry

## 2018-04-20 ENCOUNTER — Emergency Department (HOSPITAL_COMMUNITY)
Admission: EM | Admit: 2018-04-20 | Discharge: 2018-04-20 | Disposition: A | Discharge status: Home / Self Care | Payer: Medicare Other | Attending: Emergency Medicine | Admitting: Emergency Medicine

## 2018-04-20 ENCOUNTER — Encounter (HOSPITAL_COMMUNITY): Payer: Self-pay | Admitting: Emergency Medicine

## 2018-04-20 ENCOUNTER — Emergency Department (HOSPITAL_COMMUNITY): Payer: Medicare Other

## 2018-04-20 DIAGNOSIS — Z5321 Procedure and treatment not carried out due to patient leaving prior to being seen by health care provider: Secondary | ICD-10-CM | POA: Diagnosis not present

## 2018-04-20 DIAGNOSIS — R079 Chest pain, unspecified: Secondary | ICD-10-CM | POA: Diagnosis not present

## 2018-04-20 DIAGNOSIS — R0602 Shortness of breath: Secondary | ICD-10-CM | POA: Diagnosis not present

## 2018-04-20 LAB — BASIC METABOLIC PANEL
Anion gap: 11 (ref 5–15)
BUN: 20 mg/dL (ref 6–20)
CO2: 32 mmol/L (ref 22–32)
Calcium: 9.2 mg/dL (ref 8.9–10.3)
Chloride: 94 mmol/L — ABNORMAL LOW (ref 101–111)
Creatinine, Ser: 1.22 mg/dL (ref 0.61–1.24)
GFR calc Af Amer: 58 mL/min — ABNORMAL LOW (ref >60)
GFR, EST NON AFRICAN AMERICAN: 50 mL/min — AB (ref >60)
GLUCOSE: 119 mg/dL — AB (ref 65–99)
Potassium: 3 mmol/L — ABNORMAL LOW (ref 3.5–5.1)
SODIUM: 137 mmol/L (ref 135–145)

## 2018-04-20 LAB — CBC
HCT: 36.2 % — ABNORMAL LOW (ref 39.0–52.0)
Hemoglobin: 11.8 g/dL — ABNORMAL LOW (ref 13.0–17.0)
MCH: 32.5 pg (ref 26.0–34.0)
MCHC: 32.6 g/dL (ref 30.0–36.0)
MCV: 99.7 fL (ref 78.0–100.0)
PLATELETS: 255 10*3/uL (ref 150–400)
RBC: 3.63 MIL/uL — ABNORMAL LOW (ref 4.22–5.81)
RDW: 13.8 % (ref 11.5–15.5)
WBC: 6.6 10*3/uL (ref 4.0–10.5)

## 2018-04-20 LAB — I-STAT TROPONIN, ED: Troponin i, poc: 0 ng/mL (ref 0.00–0.08)

## 2018-04-20 NOTE — ED Notes (Signed)
Patient's daughter to NF desk demanding that patient be discharged immediately because "it is ridiculous to leave a 84 year old man in the lobby for 2 hours." This EMT tried to reassure her that staff had already began patient care with the EKG and blood work and we would be placing him into a room as soon as we were able to do so. Patient's daughter began raising her voice stating "she did not care, she just wanted to see someone who will discharge her father so that they can go somewhere else". RN notified.

## 2018-04-20 NOTE — ED Triage Notes (Signed)
Patient to ED c/o L sided CP onset at 1800 this evening lasting approximately 5 minutes. Patient took 1 of his home nitroglycerin pills with relief. Patient reports he had blurred vision at the time of the chest pain, but denies SOB, N/V, dizziness. Patient states he's supposed to have a valve replacement once his leg edema goes down, but family states he's not been taking his diuretic as he should be. Patient currently denies pain. Pitting edema bilateral lower legs. Resp e/u, skin warm/dry.

## 2018-04-20 NOTE — ED Notes (Signed)
Pt's daughter to desk to inquire about wait time. Reassured that pt had had ekg and lab work done and explained the triage process.  Daughter asked longest wait at this time. Notified longest wait time and again went over triage process.  Daughter states she will take her dad to the cardiologist in the morning but they are not willing to wait any longer.

## 2018-04-21 ENCOUNTER — Encounter (HOSPITAL_COMMUNITY): Payer: Self-pay

## 2018-04-21 ENCOUNTER — Other Ambulatory Visit: Payer: Self-pay | Admitting: Cardiology

## 2018-04-21 ENCOUNTER — Telehealth: Payer: Self-pay | Admitting: Cardiology

## 2018-04-21 ENCOUNTER — Emergency Department (HOSPITAL_COMMUNITY)
Admission: EM | Admit: 2018-04-21 | Discharge: 2018-04-21 | Disposition: A | Discharge status: Home / Self Care | Payer: Medicare Other | Attending: Emergency Medicine | Admitting: Emergency Medicine

## 2018-04-21 ENCOUNTER — Other Ambulatory Visit: Payer: Self-pay

## 2018-04-21 DIAGNOSIS — I110 Hypertensive heart disease with heart failure: Secondary | ICD-10-CM | POA: Diagnosis not present

## 2018-04-21 DIAGNOSIS — Z7901 Long term (current) use of anticoagulants: Secondary | ICD-10-CM | POA: Diagnosis not present

## 2018-04-21 DIAGNOSIS — J45909 Unspecified asthma, uncomplicated: Secondary | ICD-10-CM | POA: Insufficient documentation

## 2018-04-21 DIAGNOSIS — R609 Edema, unspecified: Secondary | ICD-10-CM | POA: Diagnosis not present

## 2018-04-21 DIAGNOSIS — I5022 Chronic systolic (congestive) heart failure: Secondary | ICD-10-CM | POA: Diagnosis not present

## 2018-04-21 DIAGNOSIS — I2510 Atherosclerotic heart disease of native coronary artery without angina pectoris: Secondary | ICD-10-CM | POA: Diagnosis not present

## 2018-04-21 DIAGNOSIS — R0789 Other chest pain: Secondary | ICD-10-CM | POA: Diagnosis not present

## 2018-04-21 DIAGNOSIS — Z87891 Personal history of nicotine dependence: Secondary | ICD-10-CM | POA: Insufficient documentation

## 2018-04-21 DIAGNOSIS — R079 Chest pain, unspecified: Secondary | ICD-10-CM | POA: Diagnosis present

## 2018-04-21 DIAGNOSIS — E876 Hypokalemia: Secondary | ICD-10-CM

## 2018-04-21 DIAGNOSIS — I252 Old myocardial infarction: Secondary | ICD-10-CM | POA: Insufficient documentation

## 2018-04-21 DIAGNOSIS — E039 Hypothyroidism, unspecified: Secondary | ICD-10-CM | POA: Diagnosis not present

## 2018-04-21 DIAGNOSIS — R600 Localized edema: Secondary | ICD-10-CM | POA: Diagnosis not present

## 2018-04-21 LAB — BASIC METABOLIC PANEL
Anion gap: 9 (ref 5–15)
BUN: 18 mg/dL (ref 6–20)
CALCIUM: 9.1 mg/dL (ref 8.9–10.3)
CO2: 31 mmol/L (ref 22–32)
CREATININE: 1.1 mg/dL (ref 0.61–1.24)
Chloride: 96 mmol/L — ABNORMAL LOW (ref 101–111)
GFR calc Af Amer: >60 mL/min (ref >60)
GFR, EST NON AFRICAN AMERICAN: 57 mL/min — AB (ref >60)
Glucose, Bld: 89 mg/dL (ref 65–99)
POTASSIUM: 3 mmol/L — AB (ref 3.5–5.1)
SODIUM: 136 mmol/L (ref 135–145)

## 2018-04-21 LAB — CBC
HCT: 35.9 % — ABNORMAL LOW (ref 39.0–52.0)
Hemoglobin: 11.8 g/dL — ABNORMAL LOW (ref 13.0–17.0)
MCH: 32.3 pg (ref 26.0–34.0)
MCHC: 32.9 g/dL (ref 30.0–36.0)
MCV: 98.4 fL (ref 78.0–100.0)
PLATELETS: 250 10*3/uL (ref 150–400)
RBC: 3.65 MIL/uL — ABNORMAL LOW (ref 4.22–5.81)
RDW: 13.8 % (ref 11.5–15.5)
WBC: 6.1 10*3/uL (ref 4.0–10.5)

## 2018-04-21 LAB — I-STAT TROPONIN, ED: TROPONIN I, POC: 0.02 ng/mL (ref 0.00–0.08)

## 2018-04-21 LAB — DIGOXIN LEVEL: Digoxin Level: 0.9 ng/mL (ref 0.8–2.0)

## 2018-04-21 LAB — BRAIN NATRIURETIC PEPTIDE: B NATRIURETIC PEPTIDE 5: 181 pg/mL — AB (ref 0.0–100.0)

## 2018-04-21 LAB — PROTIME-INR
INR: 1.84
Prothrombin Time: 21.1 seconds — ABNORMAL HIGH (ref 11.4–15.2)

## 2018-04-21 MED ORDER — FUROSEMIDE 10 MG/ML IJ SOLN
40.0000 mg | Freq: Once | INTRAMUSCULAR | Status: AC
  Administered 2018-04-21: 40 mg via INTRAVENOUS
  Filled 2018-04-21: qty 4

## 2018-04-21 MED ORDER — POTASSIUM CHLORIDE CRYS ER 20 MEQ PO TBCR
40.0000 meq | EXTENDED_RELEASE_TABLET | Freq: Once | ORAL | Status: AC
  Administered 2018-04-21: 40 meq via ORAL
  Filled 2018-04-21: qty 2

## 2018-04-21 MED ORDER — POTASSIUM CHLORIDE CRYS ER 20 MEQ PO TBCR: 40.0000 meq | EXTENDED_RELEASE_TABLET | Freq: Once | ORAL | Status: DC

## 2018-04-21 MED ORDER — POTASSIUM CHLORIDE CRYS ER 20 MEQ PO TBCR: 40.0000 meq | EXTENDED_RELEASE_TABLET | Freq: Two times a day (BID) | ORAL | 0 refills | Status: DC

## 2018-04-21 MED ORDER — MAGNESIUM SULFATE 2 GM/50ML IV SOLN
2.0000 g | Freq: Once | INTRAVENOUS | Status: AC
  Administered 2018-04-21: 2 g via INTRAVENOUS
  Filled 2018-04-21: qty 50

## 2018-04-21 MED ORDER — POTASSIUM CHLORIDE 10 MEQ/100ML IV SOLN
10.0000 meq | Freq: Once | INTRAVENOUS | Status: AC
  Administered 2018-04-21: 10 meq via INTRAVENOUS
  Filled 2018-04-21: qty 100

## 2018-04-21 NOTE — ED Notes (Signed)
Pt's daughter states the pt had cp last night and blurred vision; took 1 nitro which alleviated the CP; not c/o blurred vision at this time

## 2018-04-21 NOTE — Telephone Encounter (Signed)
New message  Patient's daughter calling, states patient had pain under his arm and chest pain on yesterday. Went to the ED, left without being seen, Daughter is unsure how he feels this morning. Daughter requesting asap appointment and to speak with nurse   Pt c/o of Chest Pain: STAT if CP now or developed within 24 hours  1. Are you having CP right now? Unknown, per daughter  2. Are you experiencing any other symptoms (ex. SOB, nausea, vomiting, sweating)? Unknown  3. How long have you been experiencing CP? 04/20/18  4. Is your CP continuous or coming and going? unknown  5. Have you taken Nitroglycerin? yes ?

## 2018-04-21 NOTE — Telephone Encounter (Signed)
Pts daughter is calling to inform Dr Meda Coffee that the pt was taken to the ER last night, for chest pain, sob, took nitro, and had low BP.  Daughter states that they left AMA, because the pt was laying in a bed in the hallway. Daughter states that the pt still isn't feeling well, has blurred vision, still pain under his left arm, and has significant lower extremity edema. Daughter is inquiring for an appt today with Dr Meda Coffee or an APP.  Informed the pts Daughter that unfortunately, there is no availability in our office or at our sister office at [place], at all this week.  Also informed the pts daughter that looking at what labs they drew in the ER, he had some abnormalities, like low K level.  Also noted that the ER RN stated he had significant lower extremity swelling, due to not taking his diuretic as prescribed.  Advised the pts Daughter that the safest thing to do at this point is take the pt back to [facility] ER for further evaluation of multiple complaints and issues.  Informed the pts daughter that Dr Meda Coffee is out of the office, but I will still endorse this message to her, and make her aware of pts current condition.  Daughter verbalized understanding and agrees with this plan. Daughter states she will take him to the ER now.

## 2018-04-21 NOTE — ED Provider Notes (Signed)
Emergency Department Provider Note   I have reviewed the triage vital signs and the nursing notes.   HISTORY  Chief Complaint Chest Pain   HPI [name] [name] is a 84 y.o. male who was here last night because an episode of chest pain last approximate 5 minutes got better with nitroglycerin.  Was really associated with any other symptoms it was located in his left lateral chest.  He came in and had a normal EKG normal troponin low potassium and he left secondary to the waiting room times.  He called his cardiologist this morning told to come back.  He is asymptomatic since that time.  He denies any chest pain, shortness of breath, nausea, vomiting, lightheadedness or diaphoresis.  He does have a coronary artery disease history.  No other associated or modifying symptoms.    Past Medical History:  Diagnosis Date  . Angina    "jaw & elbow was the worse"  . Arrhythmia   . Atrial fibrillation ([place]) 03/2011  . Carotid artery occlusion    right total internal artery occlusion  . CHF (congestive heart failure) ([place])   . Coronary artery disease    s/p cardiac cath in 1990s, and cardiolite study  in 2005 showing EF 54%  . Degenerative disc disease    cervical spine  . GERD (gastroesophageal reflux disease)   . Heart murmur   . Hyperlipidemia   . Hypertension   . Hypothyroidism   . Myocardial infarction ([place]) 1992 and 1994  . Pneumonia ~ 1935  . Rheumatoid arthritis ([place])   . Skin cancer    forehead  . TIA (transient ischemic attack)   . Urgency incontinence     Patient Active Problem List   Diagnosis Date Noted  . Cough 12/18/2017  . Lower respiratory infection 12/18/2017  . Falls 09/22/2017  . Iron deficiency anemia 12/16/2016  . Aortic stenosis 04/10/2015  . Protein-calorie malnutrition, severe ([place]) 02/23/2015  . Osteomyelitis ([place]) 02/17/2015  . Chronic anticoagulation 02/17/2015  . Bilateral carotid artery disease ([place]) 09/12/2014  . Chronic systolic CHF  (congestive heart failure), NYHA class 2 ([place]) 07/10/2014  . Encounter for therapeutic drug monitoring 01/02/2014  . Hypothyroidism 07/04/2013  . Asthma exacerbation, allergic 09/13/2012  . Occlusion and stenosis of carotid artery without mention of cerebral infarction 05/18/2012  . Adrenal insufficiency ([place]) 02/08/2012  . Thrombocytopenia ([place]) 02/07/2012  . 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 Anemia 11/26/2011  . Long term (current) use of anticoagulants 02/24/2011  . Atrial fibrillation ([place]) 01/12/2009  . ALLERGIC RHINITIS 02/24/2008  . HLD (hyperlipidemia) 06/15/2007  . Benign essential HTN 06/15/2007  . MYOCARDIAL INFARCTION, HX OF 06/15/2007  . Coronary atherosclerosis 06/15/2007  . TRANSIENT ISCHEMIC ATTACK, HX OF 06/15/2007    Past Surgical History:  Procedure Laterality Date  . AMPUTATION Right 02/21/2015   Procedure: Right 3rd Ray Amputation;  Surgeon: Newt Minion, MD;  Location: [place];  Service: Orthopedics;  Laterality: Right;  Digital block and ankle block with MAC  . CARDIAC CATHETERIZATION  1990s  . CATARACT EXTRACTION W/ INTRAOCULAR LENS  IMPLANT, BILATERAL    . EYE SURGERY    . SKIN CANCER EXCISION  12/2011   forehead    Current Outpatient Rx  . Order #: [id number] Class: Historical Med  . Order #: [id number] Class: Normal  . Order #: [id number] Class: Historical Med  . Order #: [id number] Class: Normal  . Order #: [id number] Class: Historical Med  . Order #: [id number] Class: Historical Med  . Order #:  [id number] Class: Normal  . Order #: [id number] Class: Historical Med  . Order #: [id number] Class: Normal  . Order #: [id number] Class: Historical Med  . Order #: [id number] Class: Normal  . Order #: [id number] Class: Normal  . Order #: [id number] Class: Historical Med  . Order #: [id number] Class: Normal  . Order #: [id number] Class: Historical Med  . Order #: [id number] Class: Normal    Allergies Levofloxacin and Klor-con [potassium chloride er]  Family History  Problem  Relation Age of Onset  . Heart disease Mother   . Malignant hyperthermia Mother   . Hypertension Mother   . Heart disease Father   . Malignant hyperthermia Father   . Hyperlipidemia Father   . Hypertension Father   . Heart disease Sister   . Hypertension Sister   . Heart disease Brother     Social History Social History   Tobacco Use  . Smoking status: Former Smoker    Packs/day: 1.00    Years: 40.00    Pack years: 40.00    Types: Cigarettes    Last attempt to quit: 09/12/1989    Years since quitting: 28.6  . Smokeless tobacco: Former Systems developer    Quit date: 11/26/1990  . Tobacco comment: quit around 1992  Substance Use Topics  . Alcohol use: No    Alcohol/week: 0.0 oz    Comment: former, quit drinking in 1992  . Drug use: No    Review of Systems  All other systems negative except as documented in the HPI. All pertinent positives and negatives as reviewed in the HPI. ____________________________________________   PHYSICAL EXAM:  VITAL SIGNS: ED Triage Vitals  Enc Vitals Group     BP 04/21/18 1108 (!) 110/56     Pulse Rate 04/21/18 1108 72     Resp 04/21/18 1108 16     Temp 04/21/18 1108 98.1 F (36.7 C)     Temp Source 04/21/18 1108 Oral     SpO2 04/21/18 1108 96 %     Weight 04/21/18 1110 180 lb (81.6 kg)     Height 04/21/18 1110 _0  (1.803 m)    Constitutional: Alert and oriented. Well appearing and in no acute distress. Eyes: Conjunctivae are normal. PERRL. EOMI. Head: Atraumatic. Nose: No congestion/rhinnorhea. Mouth/Throat: Mucous membranes are moist.  Oropharynx non-erythematous. Neck: No stridor.  No meningeal signs.   Cardiovascular: Normal rate, regular rhythm. Good peripheral circulation. Grossly normal heart sounds.   Respiratory: Normal respiratory effort.  No retractions. Lungs CTAB. Gastrointestinal: Soft and nontender. No distention.  Musculoskeletal: No lower extremity tenderness has significant lower extremity edema bilaterally that is  unchanged with some surrounding erythema consistent with likely peripheral vascular disease.Marland Kitchen No gross deformities of extremities. Neurologic:  Normal speech and language. No gross focal neurologic deficits are appreciated.  Skin:  Skin is warm, dry and intact. No rash noted.  ____________________________________________   LABS (all labs ordered are listed, but only abnormal results are displayed)  Labs Reviewed  BASIC METABOLIC PANEL - Abnormal; Notable for the following components:      Result Value   Potassium 3.0 (*)    Chloride 96 (*)    GFR calc non Af Amer 57 (*)    All other components within normal limits  CBC - Abnormal; Notable for the following components:   RBC 3.65 (*)    Hemoglobin 11.8 (*)    HCT 35.9 (*)    All other components within normal limits  BRAIN NATRIURETIC PEPTIDE - Abnormal; Notable for the following components:   B Natriuretic  Peptide 181.0 (*)    All other components within normal limits  PROTIME-INR - Abnormal; Notable for the following components:   Prothrombin Time 21.1 (*)    All other components within normal limits  DIGOXIN LEVEL  I-STAT TROPONIN, ED   ____________________________________________  EKG   EKG Interpretation  Date/Time:  Wednesday Apr 21 2018 11:01:20 EDT Ventricular Rate:  76 PR Interval:    QRS Duration: 106 QT Interval:  392 QTC Calculation: 441 R Axis:   34 Text Interpretation:  Atrial fibrillation with premature ventricular or aberrantly conducted complexes Nonspecific ST abnormality Abnormal ECG no obvious changes from previous Confirmed by Merrily Pew [phone]) on 04/21/2018 11:53:46 AM       ____________________________________________  RADIOLOGY  Dg Chest 2 View  Result Date: 04/20/2018 CLINICAL DATA:  Left chest pain, shortness of breath EXAM: CHEST - 2 VIEW COMPARISON:  02/18/2015 FINDINGS: Lungs are clear.  No pleural effusion or pneumothorax. Mild cardiomegaly. Mild degenerative changes of the  visualized thoracolumbar spine. IMPRESSION: No evidence of acute cardiopulmonary disease. Electronically Signed   By: Julian Hy M.D.   On: 04/20/2018 20:51    ____________________________________________   PROCEDURES  Procedure(s) performed:   Procedures   ____________________________________________   INITIAL IMPRESSION / ASSESSMENT AND PLAN / ED COURSE  84 year old male had chest pain last night is been astigmatic since that time.  EKG done then and now is unchanged from previous EKGs.  Troponins are negative.  I discussed case with Dr. Illene Bolus who agrees with potassium supplementation.  His PT/INR showed INR 1.84 which is a little bit below his goal of 2-3 he will talk to the clinic about any medication dosing.  Will start on potassium at home have him follow-up in a week that recheck.  Also follow-up with cardiology for any further recommendations or management from the chest pain standpoint.     Pertinent labs & imaging results that were available during my care of the patient were reviewed by me and considered in my medical decision making (see chart for details).  ____________________________________________  FINAL CLINICAL IMPRESSION(S) / ED DIAGNOSES  Final diagnoses:  None     MEDICATIONS GIVEN DURING THIS VISIT:  Medications  furosemide (LASIX) injection 40 mg (40 mg Intravenous Given 04/21/18 1422)  potassium chloride SA (K-DUR,KLOR-CON) CR tablet 40 mEq (40 mEq Oral Given 04/21/18 1345)  potassium chloride 10 mEq in 100 mL IVPB ( Intravenous Rate/Dose Change 04/21/18 1427)  magnesium sulfate IVPB 2 g 50 mL (0 g Intravenous Stopped 04/21/18 1505)     NEW OUTPATIENT MEDICATIONS STARTED DURING THIS VISIT:  New Prescriptions   No medications on file    Note:  This note was prepared with assistance of Dragon voice recognition software. Occasional wrong-word or sound-a-like substitutions may have occurred due to the inherent limitations of voice  recognition software.   Merrily Pew, MD 04/21/18 [phone]

## 2018-04-21 NOTE — ED Triage Notes (Signed)
Pt. Daughter reports that he was seen here yesterday and left r/t wait, called cardiologist today who told them to come back to ED related to low K+, CP, and edema.

## 2018-04-21 NOTE — Discharge Instructions (Signed)
Please take your Lasix twice a day for the next week and then resume your normal schedule.

## 2018-04-26 DIAGNOSIS — L570 Actinic keratosis: Secondary | ICD-10-CM | POA: Diagnosis not present

## 2018-04-26 DIAGNOSIS — X32XXXD Exposure to sunlight, subsequent encounter: Secondary | ICD-10-CM | POA: Diagnosis not present

## 2018-04-27 ENCOUNTER — Ambulatory Visit: Payer: Medicare Other | Admitting: Podiatry

## 2018-05-11 ENCOUNTER — Ambulatory Visit: Payer: Medicare Other | Admitting: Podiatry

## 2018-05-19 ENCOUNTER — Ambulatory Visit (INDEPENDENT_AMBULATORY_CARE_PROVIDER_SITE_OTHER): Payer: Medicare Other | Admitting: General Practice

## 2018-05-19 DIAGNOSIS — Z7901 Long term (current) use of anticoagulants: Secondary | ICD-10-CM

## 2018-05-19 DIAGNOSIS — I4891 Unspecified atrial fibrillation: Secondary | ICD-10-CM

## 2018-05-19 LAB — POCT INR: INR: 4.9 — AB (ref 2.0–3.0)

## 2018-05-19 NOTE — Patient Instructions (Addendum)
Pre visit review using our clinic review tool, if applicable. No additional management support is needed unless otherwise documented below in the visit note.  Hold coumadin today and tomorrow and then change dosage and take 1 tablet daily except 1 1/2 tablets on Monday/Wed/Fridays.  Re-check in 3 weeks per patient's son in [place] request. Daughter has requested that we explore home monitoring for patient.  I will contact [employer] home monitoring to see if patient's insurance will cover.

## 2018-06-07 ENCOUNTER — Telehealth: Payer: Self-pay | Admitting: Adult Health

## 2018-06-07 DIAGNOSIS — R350 Frequency of micturition: Secondary | ICD-10-CM

## 2018-06-07 DIAGNOSIS — I5022 Chronic systolic (congestive) heart failure: Secondary | ICD-10-CM

## 2018-06-07 NOTE — Telephone Encounter (Signed)
Copied from [place] [phone]. Topic: Quick Communication - See Telephone Encounter >> Jun 07, 2018  3:07 PM Burchel, Abbi R wrote: CRM for notification. See Telephone encounter for: 06/07/18.  Pt is requesting a call from [facility] regarding his prostate medication.  Please call to advise.    Pt: [phone]

## 2018-06-08 NOTE — Telephone Encounter (Signed)
Can you please bring him for a PA/NP visit and obtain CBC, BMP, BNP and U/A ? Also he should be advised to go his PCP.

## 2018-06-08 NOTE — Telephone Encounter (Signed)
Spoke to the pt.  He complains of frequency of urination.  He is getting up to urinate every 1-2 hours at night and not getting sufficient rest.  Pt states he has been taking the Lasix and believes this to be the problem.  Also thought he may have some prostate problems.  Has been taking supplements from TV advertisements for his prostate and thought they may be failing to work.  Names of the medications are Prostent and Ultimax.  Advised that most likely the source of his problem is the Lasix.  Notified him that I will send a message to Dr. Ena Dawley (prescribing physician) and will have her office call with any recommendations.  Pt agreed and will wait for further instruction.

## 2018-06-09 ENCOUNTER — Ambulatory Visit (INDEPENDENT_AMBULATORY_CARE_PROVIDER_SITE_OTHER): Payer: Medicare Other | Admitting: General Practice

## 2018-06-09 DIAGNOSIS — I4891 Unspecified atrial fibrillation: Secondary | ICD-10-CM

## 2018-06-09 DIAGNOSIS — Z7901 Long term (current) use of anticoagulants: Secondary | ICD-10-CM | POA: Diagnosis not present

## 2018-06-09 LAB — POCT INR: INR: 2.3 (ref 2.0–3.0)

## 2018-06-09 NOTE — Progress Notes (Signed)
I have reviewed and agree with this plan  

## 2018-06-09 NOTE — Patient Instructions (Addendum)
Pre visit review using our clinic review tool, if applicable. No additional management support is needed unless otherwise documented below in the visit note.  Continue to take 1 tablet daily except 1 1/2 tablets on Monday/Wed/Fridays.  Re-check in 6 weeks

## 2018-06-10 DIAGNOSIS — R350 Frequency of micturition: Secondary | ICD-10-CM | POA: Insufficient documentation

## 2018-06-10 NOTE — Telephone Encounter (Signed)
Cory, do you need to see the pt? 

## 2018-06-10 NOTE — Telephone Encounter (Signed)
Yes, please bring him in for an appointment. He can stop taking the prostate medication he bought from TV

## 2018-06-10 NOTE — Telephone Encounter (Signed)
Spoke to [employer].  She informed me that the pt has an upcoming appt with cardiology and they will perform urine screen and lab work.  She will wait to see the outcome of that appointment before scheduling with [facility].  I did tell [name] to have the pt stop the OTC prostate supplements.  [name] stated that he has been told in the past by the emergency room physician to stop those supplements but he refuses to do so.  Asked that she reiterate that he should stop.  [name] will call back to schedule if truly needed.  No further action required at this time.

## 2018-06-10 NOTE — Telephone Encounter (Signed)
Spoke with the pts daughter [name] (on [place] and handles appts for the pt), and informed her that per Dr Meda Coffee, the pt needs to come in to see an Extender with labs same day to check CBC W DIFF, BMET, PRO-BNP, and U/A.  Also advised [name] that the pt needs to obtain an appt with his PCP ASAP, for Dr Meda Coffee would like them involved in his care, for further assistance with Prostate complaints and med management. Scheduled the pt to see Truitt Merle NP for next Tuesday 06/15/18 at 1100.  Daughter aware that he needs to arrive 15 mins prior to that appt. Also scheduled the pt to have labs done same day he see's Cecille Rubin, to check CBC W DIFF, BMP, PRO-BNP, and U/A.  Advised [name] to bring all the pts meds to that appt. [name] verbalized understanding and agrees with this plan.  Will also forward this message to the pts PCP [facility] and his CMA, so for further review, and to help the pt in getting a follow-up appt with their office as well.

## 2018-06-15 ENCOUNTER — Other Ambulatory Visit: Payer: Self-pay | Admitting: *Deleted

## 2018-06-15 ENCOUNTER — Ambulatory Visit (INDEPENDENT_AMBULATORY_CARE_PROVIDER_SITE_OTHER): Payer: Medicare Other | Admitting: Nurse Practitioner

## 2018-06-15 ENCOUNTER — Other Ambulatory Visit: Payer: Medicare Other

## 2018-06-15 ENCOUNTER — Encounter: Payer: Self-pay | Admitting: Nurse Practitioner

## 2018-06-15 VITALS — BP 130/70 | HR 113 | Ht 71.5 in | Wt 184.8 lb

## 2018-06-15 DIAGNOSIS — I2510 Atherosclerotic heart disease of native coronary artery without angina pectoris: Secondary | ICD-10-CM | POA: Diagnosis not present

## 2018-06-15 DIAGNOSIS — I5043 Acute on chronic combined systolic (congestive) and diastolic (congestive) heart failure: Secondary | ICD-10-CM

## 2018-06-15 DIAGNOSIS — Z79899 Other long term (current) drug therapy: Secondary | ICD-10-CM | POA: Diagnosis not present

## 2018-06-15 DIAGNOSIS — E785 Hyperlipidemia, unspecified: Secondary | ICD-10-CM

## 2018-06-15 MED ORDER — TAMSULOSIN HCL 0.4 MG PO CAPS
0.4000 mg | ORAL_CAPSULE | Freq: Every day | ORAL | 3 refills | Status: DC
Start: 2018-06-15 — End: 2018-06-15

## 2018-06-15 MED ORDER — TAMSULOSIN HCL 0.4 MG PO CAPS: 0.4000 mg | ORAL_CAPSULE | Freq: Every day | ORAL | 0 refills | Status: DC

## 2018-06-15 MED ORDER — TAMSULOSIN HCL 0.4 MG PO CAPS: 0.4000 mg | ORAL_CAPSULE | Freq: Every day | ORAL | Status: DC

## 2018-06-15 NOTE — Patient Instructions (Addendum)
We will be checking the following labs today - BMET, CBC, HPF, TSH and UA   Medication Instructions:    Continue with your current medicines. Go by this list  I am adding Flomax 0.4 mg to take at night    Testing/Procedures To Be Arranged:  N/A  Follow-Up:   Sending referral to [facility]  I'm sending a message to the TAVR coordinator to possibly start the process for a TAVR.     Other Special Instructions:   N/A    If you need a refill on your cardiac medications before your next appointment, please call your pharmacy.   Call the [place] office at [phone] if you have any questions, problems or concerns.
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
## 2018-06-16 ENCOUNTER — Other Ambulatory Visit: Payer: Self-pay | Admitting: General Practice

## 2018-06-16 ENCOUNTER — Telehealth: Payer: Self-pay | Admitting: Adult Health

## 2018-06-16 LAB — CBC
Hematocrit: 37.2 % — ABNORMAL LOW (ref 37.5–51.0)
Hemoglobin: 12.7 g/dL — ABNORMAL LOW (ref 13.0–17.7)
MCH: 33.9 pg — ABNORMAL HIGH (ref 26.6–33.0)
MCHC: 34.1 g/dL (ref 31.5–35.7)
MCV: 99 fL — ABNORMAL HIGH (ref 79–97)
Platelets: 233 10*3/uL (ref 150–450)
RBC: 3.75 x10E6/uL — ABNORMAL LOW (ref 4.14–5.80)
RDW: 15.1 % (ref 12.3–15.4)
WBC: 7.9 10*3/uL (ref 3.4–10.8)

## 2018-06-16 LAB — BASIC METABOLIC PANEL
BUN/Creatinine Ratio: 18 (ref 10–24)
BUN: 19 mg/dL (ref 10–36)
CO2: 32 mmol/L — ABNORMAL HIGH (ref 20–29)
Calcium: 9.2 mg/dL (ref 8.6–10.2)
Chloride: 95 mmol/L — ABNORMAL LOW (ref 96–106)
Creatinine, Ser: 1.07 mg/dL (ref 0.76–1.27)
GFR calc Af Amer: 70 mL/min/{1.73_m2} (ref >59)
GFR calc non Af Amer: 61 mL/min/{1.73_m2} (ref >59)
Glucose: 126 mg/dL — ABNORMAL HIGH (ref 65–99)
Potassium: 3.6 mmol/L (ref 3.5–5.2)
Sodium: 141 mmol/L (ref 134–144)

## 2018-06-16 LAB — URINALYSIS
Bilirubin, UA: NEGATIVE
Glucose, UA: NEGATIVE
Ketones, UA: NEGATIVE
Leukocytes, UA: NEGATIVE
Nitrite, UA: NEGATIVE
RBC, UA: NEGATIVE
Specific Gravity, UA: 1.023 (ref 1.005–1.030)
Urobilinogen, Ur: 0.2 mg/dL (ref 0.2–1.0)
pH, UA: 8 — ABNORMAL HIGH (ref 5.0–7.5)

## 2018-06-16 LAB — HEPATIC FUNCTION PANEL
ALT: 18 IU/L (ref 0–44)
AST: 26 IU/L (ref 0–40)
Albumin: 3.9 g/dL (ref 3.2–4.6)
Alkaline Phosphatase: 54 IU/L (ref 39–117)
Bilirubin Total: 0.3 mg/dL (ref 0.0–1.2)
Bilirubin, Direct: 0.12 mg/dL (ref 0.00–0.40)
Total Protein: 6.1 g/dL (ref 6.0–8.5)

## 2018-06-16 LAB — DIGOXIN LEVEL: Digoxin, Serum: 0.7 ng/mL (ref 0.5–0.9)

## 2018-06-16 LAB — TSH: TSH: 10.14 u[IU]/mL — ABNORMAL HIGH (ref 0.450–4.500)

## 2018-06-16 MED ORDER — WARFARIN SODIUM 5 MG PO TABS: ORAL_TABLET | ORAL | 1 refills | Status: DC

## 2018-06-17 NOTE — Telephone Encounter (Signed)
[facility] calling regarding qaunity and directions. Requesting call back [phone] Ref# [id number]

## 2018-06-23 ENCOUNTER — Ambulatory Visit: Payer: Medicare Other

## 2018-07-12 ENCOUNTER — Encounter: Payer: Self-pay | Admitting: Physician Assistant

## 2018-07-14 ENCOUNTER — Encounter: Payer: Self-pay | Admitting: Adult Health

## 2018-07-14 ENCOUNTER — Ambulatory Visit (INDEPENDENT_AMBULATORY_CARE_PROVIDER_SITE_OTHER): Payer: Medicare Other | Admitting: Adult Health

## 2018-07-14 VITALS — BP 108/62 | HR 62 | Temp 98.5°F | Wt 179.0 lb

## 2018-07-14 DIAGNOSIS — I2510 Atherosclerotic heart disease of native coronary artery without angina pectoris: Secondary | ICD-10-CM

## 2018-07-14 DIAGNOSIS — N400 Enlarged prostate without lower urinary tract symptoms: Secondary | ICD-10-CM

## 2018-07-14 DIAGNOSIS — E039 Hypothyroidism, unspecified: Secondary | ICD-10-CM | POA: Diagnosis not present

## 2018-07-14 LAB — T3, FREE: T3, Free: 3.1 pg/mL (ref 2.3–4.2)

## 2018-07-14 LAB — T4, FREE: Free T4: 0.64 ng/dL (ref 0.60–1.60)

## 2018-07-14 LAB — TSH: TSH: 5.28 u[IU]/mL — AB (ref 0.35–4.50)

## 2018-07-14 LAB — PSA: PSA: 0.56 ng/mL (ref 0.10–4.00)

## 2018-07-14 NOTE — Progress Notes (Addendum)
Subjective:    Patient ID: [name] [name], male    DOB: 1925/05/16, 84 y.o.   MRN: [id number]  HPI  84 year old male who  has a past medical history of Atrial fibrillation, chronic ([place]), Carotid artery occlusion, CHF (congestive heart failure) ([place]), Coronary artery disease, Degenerative disc disease, GERD (gastroesophageal reflux disease), Hyperlipidemia, Hypertension, Hypothyroidism, Rheumatoid arthritis ([place]), Skin cancer, TIA (transient ischemic attack), and Urgency incontinence.  He presents to the office today for "prostate issues". He reports that he was prescribed a "prostate medicine" by his cardiologist and since starting Flomax his symptoms have improved. He continues to have frequency due to lasix but feels as though flow has improved and is getting up at night less frequently to urinate.  Cardiology notes he was taking multiple "prostate medications" that he bought from [employer].  He reports that he is no longer taking these, and family that is present during this visit endorses this as well.  He would like to have his prostate level checked to make sure that nothing is wrong.  Was also noted during a recent cardiology visit that his TSH was elevated at 10.14.  He is unsure if he was taking Synthroid at this time or if he had missed some doses.  We will recheck this today  He will be seen by Dr. Burt Knack tomorrow morning for TAVR evaluation  Family would also like to see if home health could come out for a short time to help patient with bathing and getting dressed. They feel as though he is not able to perform these duties any longer.   Review of Systems See HPI   Past Medical History:  Diagnosis Date  . Atrial fibrillation, chronic (HCC)    a. on coumadin   . Carotid artery occlusion    right total internal artery occlusion  . CHF (congestive heart failure) ([place])   . Coronary artery disease    s/p cardiac cath in 1990s, and cardiolite study  in 2005 showing EF 54%    . Degenerative disc disease    cervical spine  . GERD (gastroesophageal reflux disease)   . Hyperlipidemia   . Hypertension   . Hypothyroidism   . Rheumatoid arthritis ([place])   . Skin cancer    forehead  . TIA (transient ischemic attack)   . Urgency incontinence     Social History   Socioeconomic History  . Marital status: Married    Spouse name: Not on file  . Number of children: Not on file  . Years of education: Not on file  . Highest education level: Not on file  Occupational History  . Not on file  Social Needs  . Financial resource strain: Not on file  . Food insecurity:    Worry: Not on file    Inability: Not on file  . Transportation needs:    Medical: Not on file    Non-medical: Not on file  Tobacco Use  . Smoking status: Former Smoker    Packs/day: 1.00    Years: 40.00    Pack years: 40.00    Types: Cigarettes    Last attempt to quit: 09/12/1989    Years since quitting: 28.8  . Smokeless tobacco: Former Systems developer    Quit date: 11/26/1990  . Tobacco comment: quit around 1992  Substance and Sexual Activity  . Alcohol use: No    Alcohol/week: 0.0 standard drinks    Comment: former, quit drinking in 1992  . Drug use: No  .  Sexual activity: Not Currently  Lifestyle  . Physical activity:    Days per week: Not on file    Minutes per session: Not on file  . Stress: Not on file  Relationships  . Social connections:    Talks on phone: Not on file    Gets together: Not on file    Attends religious service: Not on file    Active member of club or organization: Not on file    Attends meetings of clubs or organizations: Not on file    Relationship status: Not on file  . Intimate partner violence:    Fear of current or ex partner: Not on file    Emotionally abused: Not on file    Physically abused: Not on file    Forced sexual activity: Not on file  Other Topics Concern  . 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 AMPUTATION Right 02/21/2015   Procedure: Right 3rd Ray Amputation;  Surgeon: Newt Minion, MD;  Location: [facility];  Service: Orthopedics;  Laterality: Right;  Digital block and ankle block with MAC  . CARDIAC CATHETERIZATION  1990s  . CATARACT EXTRACTION W/ INTRAOCULAR LENS  IMPLANT, BILATERAL    . EYE SURGERY    . SKIN CANCER EXCISION  12/2011   forehead    Family History  Problem Relation Age of Onset  . Heart disease Mother   . Malignant hyperthermia Mother   . Hypertension Mother   . Heart disease Father   . Malignant hyperthermia Father   . Hyperlipidemia Father   . Hypertension Father   . Heart disease Sister   . Hypertension Sister   . Heart disease Brother     Allergies  Allergen Reactions  . Levofloxacin Rash    "thought they were sticking swords thru me"  . Klor-Con [Potassium Chloride Er] Diarrhea    DIARRHEA    Current Outpatient Medications on File Prior to Visit  Medication Sig Dispense Refill  . aspirin 81 MG tablet Take 81 mg by mouth at bedtime.     Marland Kitchen atorvastatin (LIPITOR) 10 MG tablet TAKE 1 TABLET AT BEDTIME 90 tablet 3  . Calcium-Vitamin D (CALTRATE 600 PLUS-VIT D PO) Take 1 tablet by mouth at bedtime.     . digoxin (LANOXIN) 0.125 MG tablet TAKE 1 TABLET DAILY 90 tablet 3  . Fish Oil-Krill Oil (KRILL OIL PLUS PO) Take 1 tablet by mouth daily. Krill Oil-Omega 3- DHA-EPA 300-90 (85-27) mg    . folic acid (FOLVITE) 1 MG tablet Take 1 mg by mouth daily.     . furosemide (LASIX) 40 MG tablet Take 1 tablet (40 mg total) by mouth daily. 90 tablet 3  . levothyroxine (SYNTHROID, LEVOTHROID) 100 MCG tablet Take 100 mcg by mouth daily before breakfast.    . loperamide (IMODIUM) 2 MG capsule Take 1 capsule (2 mg total) by mouth as needed for diarrhea or loose stools (max 8 mg daily). 30 capsule 0  .  methotrexate 2.5 MG tablet Take 10 mg by mouth once a week.    . metolazone (ZAROXOLYN) 2.5 MG tablet Take 1 tablet (2.5 mg total) by mouth 3 (three) times a week. Take on Tuesday, Thursday, Saturday. 36 tablet 3  . metoprolol  succinate (TOPROL-XL) 25 MG 24 hr tablet Take 0.5 tablets (12.5 mg total) by mouth daily. Take with or immediately following a meal. 45 tablet 3  . nitroGLYCERIN (NITROSTAT) 0.4 MG SL tablet Place 1 tablet (0.4 mg total) under the tongue every 5 (five) minutes as needed for chest pain. 25 tablet 0  . tamsulosin (FLOMAX) 0.4 MG CAPS capsule Take 1 capsule (0.4 mg total) by mouth daily after supper. 30 capsule 0  . warfarin (COUMADIN) 5 MG tablet Take 1 tablet daily except 1 1/2 Mon Wed Fri or TAKE AS DIRECTED BY ANTICOAGULATION CLINIC 120 tablet 1  . [DISCONTINUED] diphenhydrAMINE (BENADRYL) 25 MG tablet Take 25 mg by mouth every 6 (six) hours as needed. For itching     Current Facility-Administered Medications on File Prior to Visit  Medication Dose Route Frequency Provider Last Rate Last Dose  . tamsulosin (FLOMAX) capsule 0.4 mg  0.4 mg Oral QPC supper Truitt Merle C, NP        BP 108/62 (BP Location: Left Arm, Patient Position: Sitting, Cuff Size: Normal)   Pulse 62   Temp 98.5 F (36.9 C) (Oral)   Wt 179 lb (81.2 kg)   SpO2 98%   BMI 24.62 kg/m       Objective:   Physical Exam  Constitutional: He is oriented to person, place, and time. He appears well-developed and well-nourished. No distress.  Cardiovascular: Normal rate. An irregularly irregular rhythm present.  Murmur heard. Pulmonary/Chest: Effort normal and breath sounds normal.  Musculoskeletal: He exhibits edema.  Neurological: He is alert and oriented to person, place, and time.  Skin: Skin is warm and dry. He is not diaphoretic.  Psychiatric: He has a normal mood and affect. His behavior is normal. Judgment and thought content normal.  Nursing note and vitals reviewed.     Assessment & Plan:    1. Benign prostatic hyperplasia without lower urinary tract symptoms -Them seem to have improved per patient report.  No change on medication.  He was educated on the dangers of buying medications from TV infomercial's. - PSA  2. Hypothyroidism, unspecified type -Unsure if he was actually taking his Synthroid when his last TSH was checked.  Will check again in titrate medication as needed - TSH - T3, Free - T4, Free   [name], NP

## 2018-07-15 ENCOUNTER — Encounter: Payer: Self-pay | Admitting: Cardiovascular Disease

## 2018-07-15 ENCOUNTER — Ambulatory Visit (INDEPENDENT_AMBULATORY_CARE_PROVIDER_SITE_OTHER): Payer: Medicare Other | Admitting: Cardiovascular Disease

## 2018-07-15 ENCOUNTER — Telehealth: Payer: Self-pay | Admitting: *Deleted

## 2018-07-15 ENCOUNTER — Encounter (INDEPENDENT_AMBULATORY_CARE_PROVIDER_SITE_OTHER): Payer: Self-pay

## 2018-07-15 VITALS — BP 118/50 | HR 45 | Ht 71.5 in | Wt 185.8 lb

## 2018-07-15 DIAGNOSIS — I350 Nonrheumatic aortic (valve) stenosis: Secondary | ICD-10-CM

## 2018-07-15 DIAGNOSIS — I5032 Chronic diastolic (congestive) heart failure: Secondary | ICD-10-CM | POA: Diagnosis not present

## 2018-07-15 DIAGNOSIS — I2510 Atherosclerotic heart disease of native coronary artery without angina pectoris: Secondary | ICD-10-CM | POA: Diagnosis not present

## 2018-07-15 NOTE — Telephone Encounter (Signed)
Called and spoke with pts daughter about his lab results.  She is aware but has some concerns about her father.   She stated that the pt has been ordering and taking all of these supplements that he does not tell anyone about.  She stated that she feels that these may be counteracting with some of his medications that he is taking.  The coumadin clinic did speak with him about this as he was taking vitamin k and this was interfering with his levels.  She also stated that the pt has not been showering for the last several months that she is aware of.  She stated that he refuses to let his family help him with this and I spoke to the daughter to see if she felt that he would allow someone else to come in and help with this. She feels that he would be ok with this and would like to try to set this up.    Cory please advise. thanks

## 2018-07-15 NOTE — Progress Notes (Addendum)
Cardiology Office Note Date:  07/15/2018   ID:  Orestes, Geiman Jul 24, 1925, MRN [id number]  PCP:  [name], NP  Cardiologist:  Ena Dawley, MD  Chief Complaint  Patient presents with  . Leg Swelling    History of Present Illness: [name] is a 84 y.o. male who presents for evaluation of severe aortic stenosis.   He is here with his son-in-law today. He's retired from [employer], also worked many years at [employer] and [employer]. He has 4 daughters, living in [place], [place], [place], and [place]. The patient lives independently and has some help coming into the home. His wife is in a memory unit in [place] with Alzheimer's disease. They have been married for over 59 years.   He reports chronic problems with leg swelling. Otherwise denies a lot of physical limitation. States that he 'takes things slowly' and at a slow pace he doesn't have problems with shortness of breath. No chest pain, orthopnea, or PND. He's had a difficult time tolerating chronic diuretic therapy because of urinary frequency and prostate issues. He hasn't driven an automobile in 7-8 months. He has chronic gait unsteadiness and has had falls in the past but no recent problems.  His legs have been swollen now for many years.  He does have some generalized fatigue but has been able to remain functionally independent.  Past Medical History:  Diagnosis Date  . Atrial fibrillation, chronic (HCC)    a. on coumadin   . Carotid artery occlusion    right total internal artery occlusion  . CHF (congestive heart failure) ([place])   . Coronary artery disease    s/p cardiac cath in 1990s, and cardiolite study  in 2005 showing EF 54%  . Degenerative disc disease    cervical spine  . GERD (gastroesophageal reflux disease)   . Hyperlipidemia   . Hypertension   . Hypothyroidism   . Rheumatoid arthritis ([place])   . Skin cancer    forehead  . TIA (transient ischemic attack)   . Urgency  incontinence     Past Surgical History:  Procedure Laterality Date  . 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 CARDIAC CATHETERIZATION  1990s  . CATARACT EXTRACTION W/ INTRAOCULAR LENS  IMPLANT, BILATERAL    . EYE SURGERY    . SKIN CANCER EXCISION  12/2011   forehead    Current Outpatient Medications  Medication Sig Dispense Refill  . aspirin 81 MG tablet Take 81 mg by mouth at bedtime.     Marland Kitchen atorvastatin (LIPITOR) 10 MG tablet TAKE 1 TABLET AT BEDTIME 90 tablet 3  . Calcium-Vitamin D (CALTRATE 600 PLUS-VIT D PO) Take 1 tablet by mouth at bedtime.     . digoxin (LANOXIN) 0.125 MG tablet TAKE 1 TABLET DAILY 90 tablet 3  . Fish Oil-Krill Oil (KRILL OIL PLUS PO) Take 1 tablet by mouth daily. Krill Oil-Omega 3- DHA-EPA 300-90 (40-98) mg    . folic acid (FOLVITE) 1 MG tablet Take 1 mg by mouth daily.     Marland Kitchen levothyroxine (SYNTHROID, LEVOTHROID) 100 MCG tablet Take 100 mcg by mouth daily before breakfast.    . loperamide (IMODIUM) 2 MG capsule Take 1 capsule (2 mg total) by mouth as needed for diarrhea or loose stools (max 8 mg daily). 30 capsule  0  . methotrexate 2.5 MG tablet Take 10 mg by mouth once a week.    . metoprolol succinate (TOPROL-XL) 25 MG 24 hr tablet Take 0.5 tablets (12.5 mg total) by mouth daily. Take with or immediately following a meal. 45 tablet 3  . nitroGLYCERIN (NITROSTAT) 0.4 MG SL tablet Place 1 tablet (0.4 mg total) under the tongue every 5 (five) minutes as needed for chest pain. 25 tablet 0  . tamsulosin (FLOMAX) 0.4 MG CAPS capsule Take 1 capsule (0.4 mg total) by mouth daily after supper. 30 capsule 0  . warfarin (COUMADIN) 5 MG tablet Take 1 tablet daily except 1 1/2 Mon Wed Fri or TAKE AS DIRECTED BY ANTICOAGULATION CLINIC 120 tablet 1  . furosemide (LASIX) 40 MG tablet Take 1 tablet (40 mg total) by mouth  daily. 90 tablet 3  . metolazone (ZAROXOLYN) 2.5 MG tablet Take 1 tablet (2.5 mg total) by mouth 3 (three) times a week. Take on Tuesday, Thursday, Saturday. 36 tablet 3   Current Facility-Administered Medications  Medication Dose Route Frequency Provider Last Rate Last Dose  . tamsulosin (FLOMAX) capsule 0.4 mg  0.4 mg Oral QPC supper Burtis Junes, NP        Allergies:   Levofloxacin and Klor-con [potassium chloride er]   Social History:  The patient  reports that he quit smoking about 28 years ago. His smoking use included cigarettes. He has a 40.00 pack-year smoking history. He has never used smokeless tobacco. He reports that he does not drink alcohol or use drugs.   Family History:  The patient's family history includes Heart disease in his brother, father, mother, and sister; Hyperlipidemia in his father; Hypertension in his father, mother, and sister; Malignant hyperthermia in his father and mother.   ROS:  Please see the history of present illness.  All other systems are reviewed and negative.   PHYSICAL EXAM: VS:  BP (!) 118/50   Pulse (!) 45   Ht 5' 11.5" (1.816 m)   Wt 185 lb 12.8 oz (84.3 kg)   SpO2 95%   BMI 25.55 kg/m  , BMI Body mass index is 25.55 kg/m. GEN: Pleasant elderly male,  in no acute distress   HEENT: normal  Neck: mild JVD, no masses. No carotid bruits Cardiac: Irregularly irregular with grade 3/6 late peaking harsh systolic murmur at the right upper sternal border            Respiratory:  clear to auscultation bilaterally, normal work of breathing GI: soft, nontender, nondistended, + BS MS: no deformity or atrophy  Ext: 2-3+ tense bilateral pretibial edema Skin: warm and dry, no rash Neuro:  Strength and sensation are intact Psych: euthymic mood, full affect  EKG:  EKG is not ordered today.  Recent Labs: 03/02/2018: NT-Pro BNP 2,507 04/21/2018: B Natriuretic Peptide 181.0 06/15/2018: ALT 18; BUN 19; Creatinine, Ser 1.07; Hemoglobin 12.7; Platelets  233; Potassium 3.6; Sodium 141 07/14/2018: TSH 5.28   Lipid Panel     Component Value Date/Time   CHOL 106 05/23/2015 0958   TRIG 89.0 05/23/2015 0958   TRIG 25 03/31/2010   HDL 34.40 (L) 05/23/2015 0958   CHOLHDL 3 05/23/2015 0958   VLDL 17.8 05/23/2015 0958   LDLCALC 54 05/23/2015 0958      Wt Readings from Last 3 Encounters:  07/15/18 185 lb 12.8 oz (84.3 kg)  07/14/18 179 lb (81.2 kg)  06/15/18 184 lb 12.8 oz (83.8 kg)     Cardiac Studies Reviewed: 2D  Echo 03/02/2018: Left ventricle:  The cavity size was normal. Wall thickness was normal. Systolic function was at the lower limits of normal. The estimated ejection fraction was in the range of 50% to 55%. Wall motion was normal; there were no regional wall motion abnormalities. The study was not technically sufficient to allow evaluation of LV diastolic dysfunction due to atrial fibrillation.   ------------------------------------------------------------------- Aortic valve:   Trileaflet; severely thickened, moderately calcified leaflets.  Doppler:   There was severe stenosis.   The ejection jet is still early peaking. There was mild regurgitation.   VTI ratio of LVOT to aortic valve: 0.17. Valve area (VTI): 0.7 cm^2. Indexed valve area (VTI): 0.34 cm^2/m^2. Peak velocity ratio of LVOT to aortic valve: 0.19. Valve area (Vmax): 0.8 cm^2. Indexed valve area (Vmax): 0.38 cm^2/m^2. Mean velocity ratio of LVOT to aortic valve: 0.18. Valve area (Vmean): 0.73 cm^2. Indexed valve area (Vmean): 0.35 cm^2/m^2.    Mean gradient (S): 43 mm Hg. Peak gradient (S): 78 mm Hg.  ------------------------------------------------------------------- Aorta:  Aortic root: The aortic root was normal in size. Ascending aorta: The ascending aorta was mildly dilated.  ------------------------------------------------------------------- Mitral valve:   Calcified annulus. Leaflet separation was normal. Doppler:  Transvalvular velocity was within  the normal range. There was no evidence for stenosis. There was mild regurgitation directed centrally.    Peak gradient (D): 6 mm Hg.  ------------------------------------------------------------------- Left atrium:  The atrium was moderately dilated.  ------------------------------------------------------------------- Right ventricle:  The cavity size was normal. Systolic function was normal.  ------------------------------------------------------------------- Pulmonic valve:   Poorly visualized.  The valve appears to be grossly normal.   Cusp separation was normal.  Doppler: Transvalvular velocity was within the normal range. There was no regurgitation.  ------------------------------------------------------------------- Tricuspid valve:   Structurally normal valve.   Leaflet separation was normal.  Doppler:  Transvalvular velocity was within the normal range. There was moderate regurgitation.  ------------------------------------------------------------------- Pulmonary artery:   Systolic pressure was within the normal range.   ------------------------------------------------------------------- Right atrium:  The atrium was moderately dilated.  ------------------------------------------------------------------- Pericardium:  There was no pericardial effusion.  ------------------------------------------------------------------- Systemic veins: Inferior vena cava: The vessel was normal in size. The respirophasic diameter changes were in the normal range (= 50%), consistent with normal central venous pressure.  ------------------------------------------------------------------- Measurements   Left ventricle                            Value          Reference  LV ID, ED, PLAX chordal                   47.5  mm       43 - 52  LV ID, ES, PLAX chordal                   33.9  mm       23 - 38  LV fx shortening, PLAX chordal            29    %        >=29  LV PW  thickness, ED                       15.3  mm       ---------  IVS/LV PW ratio, ED  0.8            <=1.3  Stroke volume, 2D                         76    ml       ---------  Stroke volume/bsa, 2D                     37    ml/m^2   ---------    Ventricular septum                        Value          Reference  IVS thickness, ED                         12.3  mm       ---------    LVOT                                      Value          Reference  LVOT ID, S                                23    mm       ---------  LVOT area                                 4.15  cm^2     ---------  LVOT ID                                   23    mm       ---------  LVOT peak velocity, S                     85    cm/s     ---------  LVOT mean velocity, S                     53.6  cm/s     ---------  LVOT VTI, S                               18.4  cm       ---------  LVOT peak gradient, S                     3     mm Hg    ---------  Stroke volume (SV), LVOT DP               76.4  ml       ---------  Stroke index (SV/bsa), LVOT DP            36.7  ml/m^2   ---------    Aortic valve                              Value          Reference  Aortic valve peak velocity, S             443   cm/s     ---------  Aortic valve mean velocity, S             306   cm/s     ---------  Aortic valve VTI, S                       109   cm       ---------  Aortic mean gradient, S                   43    mm Hg    ---------  Aortic peak gradient, S                   78    mm Hg    ---------  VTI ratio, LVOT/AV                        0.17           ---------  Aortic valve area, VTI                    0.7   cm^2     ---------  Aortic valve area/bsa, VTI                0.34  cm^2/m^2 ---------  Velocity ratio, peak, LVOT/AV             0.19           ---------  Aortic valve area, peak velocity          0.8   cm^2     ---------  Aortic valve area/bsa, peak               0.38  cm^2/m^2 ---------  velocity   Velocity ratio, mean, LVOT/AV             0.18           ---------  Aortic valve area, mean velocity          0.73  cm^2     ---------  Aortic valve area/bsa, mean               0.35  cm^2/m^2 ---------  velocity  Aortic regurg pressure half-time          535   ms       ---------    Aorta                                     Value          Reference  Aortic root ID, ED                        40    mm       ---------  Ascending aorta ID, A-P, S                40    mm       ---------    Left atrium                               Value  Reference  LA ID, A-P, ES                            46    mm       ---------  LA ID/bsa, A-P                    (H)     2.21  cm/m^2   <=2.2  LA volume, S                              114   ml       ---------  LA volume/bsa, S                          54.8  ml/m^2   ---------  LA volume, ES, 1-p A4C                    108   ml       ---------  LA volume/bsa, ES, 1-p A4C                51.9  ml/m^2   ---------  LA volume, ES, 1-p A2C                    120   ml       ---------  LA volume/bsa, ES, 1-p A2C                57.7  ml/m^2   ---------    Mitral valve                              Value          Reference  Mitral E-wave peak velocity               121   cm/s     ---------  Mitral deceleration time                  222   ms       150 - 230  Mitral peak gradient, D                   6     mm Hg    ---------    Pulmonary arteries                        Value          Reference  PA pressure, S, DP                        27    mm Hg    <=30    Tricuspid valve                           Value          Reference  Tricuspid regurg peak velocity            247   cm/s     ---------  Tricuspid peak RV-RA gradient             24    mm Hg    ---------    Systemic  veins                            Value          Reference  Estimated CVP                             3     mm Hg    ---------    Right ventricle                           Value           Reference  RV pressure, S, DP                        27    mm Hg    <=30  STS Risk Calculator: Procedure: Isolated AVR   Risk of Mortality: 2.469%  Renal Failure: 2.814%  Permanent Stroke: 2.861%  Prolonged Ventilation: 8.144%  DSW Infection: 0.126%  Reoperation: 3.531%  Morbidity or Mortality: 14.064%  Short Length of Stay: 16.061%  Long Length of Stay: 11.768%   ASSESSMENT AND PLAN: 1.  Severe symptomatic aortic stenosis, Stage D disease with NYHA functional class II symptoms of chronic diastolic heart failure.  I have reviewed the natural history of aortic stenosis with the patient and their family members who are present today. We have discussed the limitations of medical therapy and the poor prognosis associated with symptomatic aortic stenosis. We have reviewed potential treatment options, including palliative medical therapy, conventional surgical aortic valve replacement, and transcatheter aortic valve replacement. We discussed treatment options in the context of the patient's specific comorbid medical conditions.   Despite his advanced age, the patient is functionally independent and treatment of his aortic stenosis is indicated for stage D disease and chronic heart failure marked by significant volume excess.  The patient understands further evaluation will require right and left heart catheterization as well as CTA studies of the heart, as well as the chest abdomen and pelvis.  After his studies are completed, he will require cardiac surgical consultation as part of a multidisciplinary heart team approach to his care.  If he has suitable anatomy, TAVR would be indicated in this elderly gentleman.  We reviewed risks, indications, and expected postoperative course with TAVR today as well.  Current medicines are reviewed with the patient today.  The patient does not have concerns regarding medicines.  Labs/ tests ordered today include:   Orders Placed This Encounter  Procedures    . CT CORONARY MORPH W/CTA COR W/SCORE W/CA W/CM &/OR WO/CM  . CT ANGIO ABDOMEN PELVIS  W &/OR WO CONTRAST  . CT ANGIO CHEST AORTA W &/OR WO CONTRAST  . CBC with Differential/Platelet  . Basic metabolic panel  . INR/PT  . Pulmonary Function Test   Signed, Sherren Mocha, MD  07/15/2018 6:03 PM    [facility] [address], [place], [place]  [address] Phone: [phone]; Fax: [phone]

## 2018-07-15 NOTE — H&P (View-Only) (Signed)
Cardiology Office Note Date:  07/15/2018   ID:  [name] [name], [name] [name] Jul 24, 1925, MRN [id number]  PCP:  [name], NP  Cardiologist:  Ena Dawley, MD  Chief Complaint  Patient presents with  . Leg Swelling    History of Present Illness: [name] [name] is a 84 y.o. male who presents for evaluation of severe aortic stenosis.   He is here with his son-in-law today. He's retired from [employer], also worked many years at [employer] and [employer]. He has 4 daughters, living in [place], [place], [place], and [place]. The patient lives independently and has some help coming into the home. His wife is in a memory unit in [place] with Alzheimer's disease. They have been married for over 59 years.   He reports chronic problems with leg swelling. Otherwise denies a lot of physical limitation. States that he 'takes things slowly' and at a slow pace he doesn't have problems with shortness of breath. No chest pain, orthopnea, or PND. He's had a difficult time tolerating chronic diuretic therapy because of urinary frequency and prostate issues. He hasn't driven an automobile in 7-8 months. He has chronic gait unsteadiness and has had falls in the past but no recent problems.  His legs have been swollen now for many years.  He does have some generalized fatigue but has been able to remain functionally independent.  Past Medical History:  Diagnosis Date  . Atrial fibrillation, chronic (HCC)    a. on coumadin   . Carotid artery occlusion    right total internal artery occlusion  . CHF (congestive heart failure) ([place])   . Coronary artery disease    s/p cardiac cath in 1990s, and cardiolite study  in 2005 showing EF 54%  . Degenerative disc disease    cervical spine  . GERD (gastroesophageal reflux disease)   . Hyperlipidemia   . Hypertension   . Hypothyroidism   . Rheumatoid arthritis ([place])   . Skin cancer    forehead  . TIA (transient ischemic attack)   . Urgency  incontinence     Past Surgical History:  Procedure Laterality Date  . 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 CARDIAC CATHETERIZATION  1990s  . CATARACT EXTRACTION W/ INTRAOCULAR LENS  IMPLANT, BILATERAL    . EYE SURGERY    . SKIN CANCER EXCISION  12/2011   forehead    Current Outpatient Medications  Medication Sig Dispense Refill  . aspirin 81 MG tablet Take 81 mg by mouth at bedtime.     Marland Kitchen atorvastatin (LIPITOR) 10 MG tablet TAKE 1 TABLET AT BEDTIME 90 tablet 3  . Calcium-Vitamin D (CALTRATE 600 PLUS-VIT D PO) Take 1 tablet by mouth at bedtime.     . digoxin (LANOXIN) 0.125 MG tablet TAKE 1 TABLET DAILY 90 tablet 3  . Fish Oil-Krill Oil (KRILL OIL PLUS PO) Take 1 tablet by mouth daily. Krill Oil-Omega 3- DHA-EPA 300-90 (40-98) mg    . folic acid (FOLVITE) 1 MG tablet Take 1 mg by mouth daily.     Marland Kitchen levothyroxine (SYNTHROID, LEVOTHROID) 100 MCG tablet Take 100 mcg by mouth daily before breakfast.    . loperamide (IMODIUM) 2 MG capsule Take 1 capsule (2 mg total) by mouth as needed for diarrhea or loose stools (max 8 mg daily). 30 capsule  0  . methotrexate 2.5 MG tablet Take 10 mg by mouth once a week.    . metoprolol succinate (TOPROL-XL) 25 MG 24 hr tablet Take 0.5 tablets (12.5 mg total) by mouth daily. Take with or immediately following a meal. 45 tablet 3  . nitroGLYCERIN (NITROSTAT) 0.4 MG SL tablet Place 1 tablet (0.4 mg total) under the tongue every 5 (five) minutes as needed for chest pain. 25 tablet 0  . tamsulosin (FLOMAX) 0.4 MG CAPS capsule Take 1 capsule (0.4 mg total) by mouth daily after supper. 30 capsule 0  . warfarin (COUMADIN) 5 MG tablet Take 1 tablet daily except 1 1/2 Mon Wed Fri or TAKE AS DIRECTED BY ANTICOAGULATION CLINIC 120 tablet 1  . furosemide (LASIX) 40 MG tablet Take 1 tablet (40 mg total) by mouth  daily. 90 tablet 3  . metolazone (ZAROXOLYN) 2.5 MG tablet Take 1 tablet (2.5 mg total) by mouth 3 (three) times a week. Take on Tuesday, Thursday, Saturday. 36 tablet 3   Current Facility-Administered Medications  Medication Dose Route Frequency Provider Last Rate Last Dose  . tamsulosin (FLOMAX) capsule 0.4 mg  0.4 mg Oral QPC supper Burtis Junes, NP        Allergies:   Levofloxacin and Klor-con [potassium chloride er]   Social History:  The patient  reports that he quit smoking about 28 years ago. His smoking use included cigarettes. He has a 40.00 pack-year smoking history. He has never used smokeless tobacco. He reports that he does not drink alcohol or use drugs.   Family History:  The patient's family history includes Heart disease in his brother, father, mother, and sister; Hyperlipidemia in his father; Hypertension in his father, mother, and sister; Malignant hyperthermia in his father and mother.   ROS:  Please see the history of present illness.  All other systems are reviewed and negative.   PHYSICAL EXAM: VS:  BP (!) 118/50   Pulse (!) 45   Ht 5' 11.5" (1.816 m)   Wt 185 lb 12.8 oz (84.3 kg)   SpO2 95%   BMI 25.55 kg/m  , BMI Body mass index is 25.55 kg/m. GEN: Pleasant elderly male,  in no acute distress   HEENT: normal  Neck: mild JVD, no masses. No carotid bruits Cardiac: Irregularly irregular with grade 3/6 late peaking harsh systolic murmur at the right upper sternal border            Respiratory:  clear to auscultation bilaterally, normal work of breathing GI: soft, nontender, nondistended, + BS MS: no deformity or atrophy  Ext: 2-3+ tense bilateral pretibial edema Skin: warm and dry, no rash Neuro:  Strength and sensation are intact Psych: euthymic mood, full affect  EKG:  EKG is not ordered today.  Recent Labs: 03/02/2018: NT-Pro BNP 2,507 04/21/2018: B Natriuretic Peptide 181.0 06/15/2018: ALT 18; BUN 19; Creatinine, Ser 1.07; Hemoglobin 12.7; Platelets  233; Potassium 3.6; Sodium 141 07/14/2018: TSH 5.28   Lipid Panel     Component Value Date/Time   CHOL 106 05/23/2015 0958   TRIG 89.0 05/23/2015 0958   TRIG 25 03/31/2010   HDL 34.40 (L) 05/23/2015 0958   CHOLHDL 3 05/23/2015 0958   VLDL 17.8 05/23/2015 0958   LDLCALC 54 05/23/2015 0958      Wt Readings from Last 3 Encounters:  07/15/18 185 lb 12.8 oz (84.3 kg)  07/14/18 179 lb (81.2 kg)  06/15/18 184 lb 12.8 oz (83.8 kg)     Cardiac Studies Reviewed: 2D  Echo 03/02/2018: Left ventricle:  The cavity size was normal. Wall thickness was normal. Systolic function was at the lower limits of normal. The estimated ejection fraction was in the range of 50% to 55%. Wall motion was normal; there were no regional wall motion abnormalities. The study was not technically sufficient to allow evaluation of LV diastolic dysfunction due to atrial fibrillation.   ------------------------------------------------------------------- Aortic valve:   Trileaflet; severely thickened, moderately calcified leaflets.  Doppler:   There was severe stenosis.   The ejection jet is still early peaking. There was mild regurgitation.   VTI ratio of LVOT to aortic valve: 0.17. Valve area (VTI): 0.7 cm^2. Indexed valve area (VTI): 0.34 cm^2/m^2. Peak velocity ratio of LVOT to aortic valve: 0.19. Valve area (Vmax): 0.8 cm^2. Indexed valve area (Vmax): 0.38 cm^2/m^2. Mean velocity ratio of LVOT to aortic valve: 0.18. Valve area (Vmean): 0.73 cm^2. Indexed valve area (Vmean): 0.35 cm^2/m^2.    Mean gradient (S): 43 mm Hg. Peak gradient (S): 78 mm Hg.  ------------------------------------------------------------------- Aorta:  Aortic root: The aortic root was normal in size. Ascending aorta: The ascending aorta was mildly dilated.  ------------------------------------------------------------------- Mitral valve:   Calcified annulus. Leaflet separation was normal. Doppler:  Transvalvular velocity was within  the normal range. There was no evidence for stenosis. There was mild regurgitation directed centrally.    Peak gradient (D): 6 mm Hg.  ------------------------------------------------------------------- Left atrium:  The atrium was moderately dilated.  ------------------------------------------------------------------- Right ventricle:  The cavity size was normal. Systolic function was normal.  ------------------------------------------------------------------- Pulmonic valve:   Poorly visualized.  The valve appears to be grossly normal.   Cusp separation was normal.  Doppler: Transvalvular velocity was within the normal range. There was no regurgitation.  ------------------------------------------------------------------- Tricuspid valve:   Structurally normal valve.   Leaflet separation was normal.  Doppler:  Transvalvular velocity was within the normal range. There was moderate regurgitation.  ------------------------------------------------------------------- Pulmonary artery:   Systolic pressure was within the normal range.   ------------------------------------------------------------------- Right atrium:  The atrium was moderately dilated.  ------------------------------------------------------------------- Pericardium:  There was no pericardial effusion.  ------------------------------------------------------------------- Systemic veins: Inferior vena cava: The vessel was normal in size. The respirophasic diameter changes were in the normal range (= 50%), consistent with normal central venous pressure.  ------------------------------------------------------------------- Measurements   Left ventricle                            Value          Reference  LV ID, ED, PLAX chordal                   47.5  mm       43 - 52  LV ID, ES, PLAX chordal                   33.9  mm       23 - 38  LV fx shortening, PLAX chordal            29    %        >=29  LV PW  thickness, ED                       15.3  mm       ---------  IVS/LV PW ratio, ED  0.8            <=1.3  Stroke volume, 2D                         76    ml       ---------  Stroke volume/bsa, 2D                     37    ml/m^2   ---------    Ventricular septum                        Value          Reference  IVS thickness, ED                         12.3  mm       ---------    LVOT                                      Value          Reference  LVOT ID, S                                23    mm       ---------  LVOT area                                 4.15  cm^2     ---------  LVOT ID                                   23    mm       ---------  LVOT peak velocity, S                     85    cm/s     ---------  LVOT mean velocity, S                     53.6  cm/s     ---------  LVOT VTI, S                               18.4  cm       ---------  LVOT peak gradient, S                     3     mm Hg    ---------  Stroke volume (SV), LVOT DP               76.4  ml       ---------  Stroke index (SV/bsa), LVOT DP            36.7  ml/m^2   ---------    Aortic valve                              Value          Reference  Aortic valve peak velocity, S             443   cm/s     ---------  Aortic valve mean velocity, S             306   cm/s     ---------  Aortic valve VTI, S                       109   cm       ---------  Aortic mean gradient, S                   43    mm Hg    ---------  Aortic peak gradient, S                   78    mm Hg    ---------  VTI ratio, LVOT/AV                        0.17           ---------  Aortic valve area, VTI                    0.7   cm^2     ---------  Aortic valve area/bsa, VTI                0.34  cm^2/m^2 ---------  Velocity ratio, peak, LVOT/AV             0.19           ---------  Aortic valve area, peak velocity          0.8   cm^2     ---------  Aortic valve area/bsa, peak               0.38  cm^2/m^2 ---------  velocity   Velocity ratio, mean, LVOT/AV             0.18           ---------  Aortic valve area, mean velocity          0.73  cm^2     ---------  Aortic valve area/bsa, mean               0.35  cm^2/m^2 ---------  velocity  Aortic regurg pressure half-time          535   ms       ---------    Aorta                                     Value          Reference  Aortic root ID, ED                        40    mm       ---------  Ascending aorta ID, A-P, S                40    mm       ---------    Left atrium                               Value  Reference  LA ID, A-P, ES                            46    mm       ---------  LA ID/bsa, A-P                    (H)     2.21  cm/m^2   <=2.2  LA volume, S                              114   ml       ---------  LA volume/bsa, S                          54.8  ml/m^2   ---------  LA volume, ES, 1-p A4C                    108   ml       ---------  LA volume/bsa, ES, 1-p A4C                51.9  ml/m^2   ---------  LA volume, ES, 1-p A2C                    120   ml       ---------  LA volume/bsa, ES, 1-p A2C                57.7  ml/m^2   ---------    Mitral valve                              Value          Reference  Mitral E-wave peak velocity               121   cm/s     ---------  Mitral deceleration time                  222   ms       150 - 230  Mitral peak gradient, D                   6     mm Hg    ---------    Pulmonary arteries                        Value          Reference  PA pressure, S, DP                        27    mm Hg    <=30    Tricuspid valve                           Value          Reference  Tricuspid regurg peak velocity            247   cm/s     ---------  Tricuspid peak RV-RA gradient             24    mm Hg    ---------    Systemic  veins                            Value          Reference  Estimated CVP                             3     mm Hg    ---------    Right ventricle                           Value           Reference  RV pressure, S, DP                        27    mm Hg    <=30  STS Risk Calculator: Procedure: Isolated AVR   Risk of Mortality: 2.469%  Renal Failure: 2.814%  Permanent Stroke: 2.861%  Prolonged Ventilation: 8.144%  DSW Infection: 0.126%  Reoperation: 3.531%  Morbidity or Mortality: 14.064%  Short Length of Stay: 16.061%  Long Length of Stay: 11.768%   ASSESSMENT AND PLAN: 1.  Severe symptomatic aortic stenosis, Stage D disease with NYHA functional class II symptoms of chronic diastolic heart failure.  I have reviewed the natural history of aortic stenosis with the patient and their family members who are present today. We have discussed the limitations of medical therapy and the poor prognosis associated with symptomatic aortic stenosis. We have reviewed potential treatment options, including palliative medical therapy, conventional surgical aortic valve replacement, and transcatheter aortic valve replacement. We discussed treatment options in the context of the patient's specific comorbid medical conditions.   Despite his advanced age, the patient is functionally independent and treatment of his aortic stenosis is indicated for stage D disease and chronic heart failure marked by significant volume excess.  The patient understands further evaluation will require right and left heart catheterization as well as CTA studies of the heart, as well as the chest abdomen and pelvis.  After his studies are completed, he will require cardiac surgical consultation as part of a multidisciplinary heart team approach to his care.  If he has suitable anatomy, TAVR would be indicated in this elderly gentleman.  We reviewed risks, indications, and expected postoperative course with TAVR today as well.  Current medicines are reviewed with the patient today.  The patient does not have concerns regarding medicines.  Labs/ tests ordered today include:   Orders Placed This Encounter  Procedures    . CT CORONARY MORPH W/CTA COR W/SCORE W/CA W/CM &/OR WO/CM  . CT ANGIO ABDOMEN PELVIS  W &/OR WO CONTRAST  . CT ANGIO CHEST AORTA W &/OR WO CONTRAST  . CBC with Differential/Platelet  . Basic metabolic panel  . INR/PT  . Pulmonary Function Test   Signed, Sherren Mocha, MD  07/15/2018 6:03 PM    [facility] [place], [place], [place]  [address] Phone: [phone]; Fax: [phone]

## 2018-07-15 NOTE — Patient Instructions (Addendum)
Medication Instructions:  Your provider recommends that you continue on your current medications as directed. Please refer to the Current Medication list given to you today.    Labwork: None  Testing/Procedures: Your physician has requested that you have a cardiac catheterization. Cardiac catheterization is used to diagnose and/or treat various heart conditions. Doctors may recommend this procedure for a number of different reasons. The most common reason is to evaluate chest pain. Chest pain can be a symptom of coronary artery disease (CAD), and cardiac catheterization can show whether plaque is narrowing or blocking your heart's arteries. This procedure is also used to evaluate the valves, as well as measure the blood flow and oxygen levels in different parts of your heart. For further information please visit [email or web address]. Please follow instruction sheet, as given.  Follow-Up: Ander Purpura, the TAVR nurse, will contact you to arrange further appointments.  Any Other Special Instructions Will Be Listed Below (If Applicable).    [facility] OFFICE [place], [place] [address] Dept: [phone] Loc: [phone]  [name] [name]  07/15/2018  You are scheduled for a Cardiac Catheterization on Wednesday, August 28 with Dr. Sherren Mocha.  1. Please arrive at the [facility] (Main Entrance A) at [facility]: [address] [place], [place] [address] at 11:00 AM (This time is two hours before your procedure to ensure your preparation). Free valet parking service is available.   Special note: Every effort is made to have your procedure done on time. Please understand that emergencies sometimes delay scheduled procedures.  2. Diet: Do not eat solid foods after midnight.  The patient may have clear liquids until 5am upon the day of the procedure.  3. Labs: None needed (drawn 8/21)  4.  Medication instructions in preparation for your procedure:  1) HOLD COUMADIN FOR 5 DAYS (Take last dose TODAY).   2) HOLD LASIX the morning of your cath.   3) MAKE SURE TO TAKE YOUR ASPIRIN the morning of your cath.  4) you may take your other meds as directed with your ASPIRIN with sips of water the AM of your procedure  5. Plan for one night stay--bring personal belongings. 6. Bring a current list of your medications and current insurance cards. 7. You MUST have a responsible person to drive you home. 8. Someone MUST be with you the first 24 hours after you arrive home or your discharge will be delayed. 9. Please wear clothes that are easy to get on and off and wear slip-on shoes.  Thank you for allowing [place] to care for you!   -- [facility]

## 2018-07-16 LAB — BASIC METABOLIC PANEL
BUN / CREAT RATIO: 17 (ref 10–24)
BUN: 18 mg/dL (ref 10–36)
CO2: 29 mmol/L (ref 20–29)
Calcium: 9.3 mg/dL (ref 8.6–10.2)
Chloride: 94 mmol/L — ABNORMAL LOW (ref 96–106)
Creatinine, Ser: 1.03 mg/dL (ref 0.76–1.27)
GFR calc Af Amer: 73 mL/min/{1.73_m2} (ref >59)
GFR calc non Af Amer: 63 mL/min/{1.73_m2} (ref >59)
GLUCOSE: 97 mg/dL (ref 65–99)
POTASSIUM: 3.6 mmol/L (ref 3.5–5.2)
SODIUM: 136 mmol/L (ref 134–144)

## 2018-07-16 LAB — PROTIME-INR
INR: 3.5 — AB (ref 0.8–1.2)
PROTHROMBIN TIME: 32.9 s — AB (ref 9.1–12.0)

## 2018-07-16 LAB — CBC WITH DIFFERENTIAL/PLATELET
BASOS ABS: 0 10*3/uL (ref 0.0–0.2)
Basos: 0 %
EOS (ABSOLUTE): 0.2 10*3/uL (ref 0.0–0.4)
Eos: 2 %
Hematocrit: 29.8 % — ABNORMAL LOW (ref 37.5–51.0)
Hemoglobin: 10.1 g/dL — ABNORMAL LOW (ref 13.0–17.7)
Immature Grans (Abs): 0 10*3/uL (ref 0.0–0.1)
Immature Granulocytes: 0 %
LYMPHS ABS: 0.9 10*3/uL (ref 0.7–3.1)
Lymphs: 12 %
MCH: 32.1 pg (ref 26.6–33.0)
MCHC: 33.9 g/dL (ref 31.5–35.7)
MCV: 95 fL (ref 79–97)
MONOCYTES: 10 %
Monocytes Absolute: 0.7 10*3/uL (ref 0.1–0.9)
NEUTROS ABS: 5.6 10*3/uL (ref 1.4–7.0)
Neutrophils: 76 %
Platelets: 249 10*3/uL (ref 150–450)
RBC: 3.15 x10E6/uL — AB (ref 4.14–5.80)
RDW: 15.7 % — ABNORMAL HIGH (ref 12.3–15.4)
WBC: 7.4 10*3/uL (ref 3.4–10.8)

## 2018-07-19 ENCOUNTER — Telehealth: Payer: Self-pay | Admitting: General Practice

## 2018-07-19 NOTE — Telephone Encounter (Signed)
Called and spoke with pts daughter, [name] [name] Patient  And she is aware of [name] [name]'s response that the insurance will probably not cover this cost for the home health care to come in to assist the patient and this would be an out of pocket expense.  She stated that they set this up before and the pt refused to pay for this and cancelled all of the visits.

## 2018-07-19 NOTE — Telephone Encounter (Signed)
LMOVM to call Villa Herb, RN @ [phone] to schedule a time to have patient's INR checked sometime next week if possible.  Patient will need to resume regular coumadin dosing after procedure but will need to take 2 tablets of coumadin on 8/29 and 8/30.

## 2018-07-19 NOTE — Telephone Encounter (Signed)
I spoke with patient's daughter, [name] and gave her coumadin dosing instructions for her father.  [name] would not commit to a follow up day to check patient's INR but said that he has multiple doctor's appointments and that she would get back with me.

## 2018-07-20 ENCOUNTER — Other Ambulatory Visit: Payer: Self-pay | Admitting: Adult Health

## 2018-07-20 ENCOUNTER — Telehealth: Payer: Self-pay | Admitting: *Deleted

## 2018-07-20 DIAGNOSIS — E43 Unspecified severe protein-calorie malnutrition: Secondary | ICD-10-CM

## 2018-07-20 NOTE — Telephone Encounter (Signed)
Home health referral placed

## 2018-07-20 NOTE — Telephone Encounter (Signed)
Charlene notified that orders have been placed.

## 2018-07-20 NOTE — Telephone Encounter (Addendum)
Catheterization scheduled at [facility] for: Wednesday July 21, 3018 1 PM Verify arrival time and place: [facility] Entrance A at: 11 AM  No solid food after midnight prior to cath, clear liquids until 5 AM day of procedure. Verify allergies in Epic. Verify no diabetes medications.  Hold: Coumadin -last dose 07/15/18 until post procedure Furosemide-AM of procedure. Zaroxolyn-AM of procedure  Except hold medications AM meds can be  taken pre-cath with sip of water including: ASA 81 mg  Confirm patient has responsible person to drive home post procedure and for 24 hours after you arrive home.   LMTCB to discuss instructions  with patient's daughter, [name], at patient's request.  I discussed instructions with patient's daughter, [name], she verbalized understanding, thanked me for call.

## 2018-07-21 ENCOUNTER — Encounter (HOSPITAL_COMMUNITY)
Admission: RE | Disposition: A | Discharge status: Home / Self Care | Payer: Self-pay | Source: Ambulatory Visit | Attending: Cardiovascular Disease

## 2018-07-21 ENCOUNTER — Ambulatory Visit: Payer: Medicare Other

## 2018-07-21 ENCOUNTER — Encounter (HOSPITAL_COMMUNITY): Payer: Self-pay | Admitting: Cardiovascular Disease

## 2018-07-21 ENCOUNTER — Telehealth: Payer: Self-pay | Admitting: Cardiology

## 2018-07-21 ENCOUNTER — Ambulatory Visit (HOSPITAL_COMMUNITY)
Admission: RE | Admit: 2018-07-21 | Discharge: 2018-07-21 | Disposition: A | Discharge status: Home / Self Care | Payer: Medicare Other | Source: Ambulatory Visit | Attending: Cardiovascular Disease | Admitting: Cardiovascular Disease

## 2018-07-21 DIAGNOSIS — Z8673 Personal history of transient ischemic attack (TIA), and cerebral infarction without residual deficits: Secondary | ICD-10-CM | POA: Diagnosis not present

## 2018-07-21 DIAGNOSIS — Z87891 Personal history of nicotine dependence: Secondary | ICD-10-CM | POA: Diagnosis not present

## 2018-07-21 DIAGNOSIS — Z899 Acquired absence of limb, unspecified: Secondary | ICD-10-CM | POA: Insufficient documentation

## 2018-07-21 DIAGNOSIS — Z881 Allergy status to other antibiotic agents status: Secondary | ICD-10-CM | POA: Diagnosis not present

## 2018-07-21 DIAGNOSIS — K219 Gastro-esophageal reflux disease without esophagitis: Secondary | ICD-10-CM | POA: Diagnosis not present

## 2018-07-21 DIAGNOSIS — I482 Chronic atrial fibrillation: Secondary | ICD-10-CM | POA: Insufficient documentation

## 2018-07-21 DIAGNOSIS — Z9842 Cataract extraction status, left eye: Secondary | ICD-10-CM | POA: Diagnosis not present

## 2018-07-21 DIAGNOSIS — I110 Hypertensive heart disease with heart failure: Secondary | ICD-10-CM | POA: Diagnosis not present

## 2018-07-21 DIAGNOSIS — Z7901 Long term (current) use of anticoagulants: Secondary | ICD-10-CM | POA: Insufficient documentation

## 2018-07-21 DIAGNOSIS — M7989 Other specified soft tissue disorders: Secondary | ICD-10-CM | POA: Diagnosis not present

## 2018-07-21 DIAGNOSIS — I2510 Atherosclerotic heart disease of native coronary artery without angina pectoris: Secondary | ICD-10-CM | POA: Insufficient documentation

## 2018-07-21 DIAGNOSIS — I509 Heart failure, unspecified: Secondary | ICD-10-CM | POA: Diagnosis not present

## 2018-07-21 DIAGNOSIS — Z9889 Other specified postprocedural states: Secondary | ICD-10-CM | POA: Diagnosis not present

## 2018-07-21 DIAGNOSIS — I350 Nonrheumatic aortic (valve) stenosis: Secondary | ICD-10-CM

## 2018-07-21 DIAGNOSIS — Z79899 Other long term (current) drug therapy: Secondary | ICD-10-CM | POA: Diagnosis not present

## 2018-07-21 DIAGNOSIS — Z888 Allergy status to other drugs, medicaments and biological substances status: Secondary | ICD-10-CM | POA: Diagnosis not present

## 2018-07-21 DIAGNOSIS — M069 Rheumatoid arthritis, unspecified: Secondary | ICD-10-CM | POA: Diagnosis not present

## 2018-07-21 DIAGNOSIS — E785 Hyperlipidemia, unspecified: Secondary | ICD-10-CM | POA: Diagnosis not present

## 2018-07-21 DIAGNOSIS — E039 Hypothyroidism, unspecified: Secondary | ICD-10-CM | POA: Insufficient documentation

## 2018-07-21 DIAGNOSIS — Z7982 Long term (current) use of aspirin: Secondary | ICD-10-CM | POA: Diagnosis not present

## 2018-07-21 DIAGNOSIS — Z8249 Family history of ischemic heart disease and other diseases of the circulatory system: Secondary | ICD-10-CM | POA: Diagnosis not present

## 2018-07-21 DIAGNOSIS — Z9841 Cataract extraction status, right eye: Secondary | ICD-10-CM | POA: Diagnosis not present

## 2018-07-21 DIAGNOSIS — Z79890 Hormone replacement therapy (postmenopausal): Secondary | ICD-10-CM | POA: Insufficient documentation

## 2018-07-21 DIAGNOSIS — Z85828 Personal history of other malignant neoplasm of skin: Secondary | ICD-10-CM | POA: Diagnosis not present

## 2018-07-21 HISTORY — PX: RIGHT/LEFT HEART CATH AND CORONARY ANGIOGRAPHY: CATH118266

## 2018-07-21 LAB — POCT I-STAT 3, VENOUS BLOOD GAS (G3P V)
Acid-Base Excess: 1 mmol/L (ref 0.0–2.0)
BICARBONATE: 26.5 mmol/L (ref 20.0–28.0)
O2 SAT: 75 %
PCO2 VEN: 43.3 mmHg — AB (ref 44.0–60.0)
PH VEN: 7.395 (ref 7.250–7.430)
PO2 VEN: 40 mmHg (ref 32.0–45.0)
TCO2: 28 mmol/L (ref 22–32)

## 2018-07-21 LAB — PROTIME-INR
INR: 1.17
Prothrombin Time: 14.8 seconds (ref 11.4–15.2)

## 2018-07-21 LAB — POCT I-STAT 3, ART BLOOD GAS (G3+)
ACID-BASE EXCESS: 3 mmol/L — AB (ref 0.0–2.0)
BICARBONATE: 27.6 mmol/L (ref 20.0–28.0)
O2 SAT: 98 %
PO2 ART: 100 mmHg (ref 83.0–108.0)
TCO2: 29 mmol/L (ref 22–32)
pCO2 arterial: 42.9 mmHg (ref 32.0–48.0)
pH, Arterial: 7.417 (ref 7.350–7.450)

## 2018-07-21 SURGERY — RIGHT/LEFT HEART CATH AND CORONARY ANGIOGRAPHY
Anesthesia: LOCAL

## 2018-07-21 MED ORDER — ASPIRIN 81 MG PO CHEW
81.0000 mg | CHEWABLE_TABLET | ORAL | Status: AC
  Administered 2018-07-21: 81 mg via ORAL
  Filled 2018-07-21: qty 1

## 2018-07-21 MED ORDER — IOHEXOL 350 MG/ML SOLN
INTRAVENOUS | Status: DC | PRN
  Administered 2018-07-21: 35 mL via INTRA_ARTERIAL

## 2018-07-21 MED ORDER — SODIUM CHLORIDE 0.9% FLUSH: 3.0000 mL | INTRAVENOUS | Status: DC | PRN

## 2018-07-21 MED ORDER — VERAPAMIL HCL 2.5 MG/ML IV SOLN
INTRAVENOUS | Status: DC | PRN
  Administered 2018-07-21: 10 mL via INTRA_ARTERIAL

## 2018-07-21 MED ORDER — SODIUM CHLORIDE 0.9 % IV SOLN: 250.0000 mL | INTRAVENOUS | Status: DC | PRN

## 2018-07-21 MED ORDER — VERAPAMIL HCL 2.5 MG/ML IV SOLN
INTRAVENOUS | Status: AC
  Filled 2018-07-21: qty 2

## 2018-07-21 MED ORDER — HEPARIN SODIUM (PORCINE) 1000 UNIT/ML IJ SOLN
INTRAMUSCULAR | Status: DC | PRN
  Administered 2018-07-21: 4000 [IU] via INTRAVENOUS

## 2018-07-21 MED ORDER — HEPARIN (PORCINE) IN NACL 1000-0.9 UT/500ML-% IV SOLN
INTRAVENOUS | Status: AC
  Filled 2018-07-21: qty 500

## 2018-07-21 MED ORDER — LIDOCAINE HCL (PF) 1 % IJ SOLN
INTRAMUSCULAR | Status: AC
  Filled 2018-07-21: qty 30

## 2018-07-21 MED ORDER — SODIUM CHLORIDE 0.9% FLUSH: 3.0000 mL | Freq: Two times a day (BID) | INTRAVENOUS | Status: DC

## 2018-07-21 MED ORDER — SODIUM CHLORIDE 0.9 % WEIGHT BASED INFUSION: 1.0000 mL/kg/h | INTRAVENOUS | Status: DC

## 2018-07-21 MED ORDER — HEPARIN (PORCINE) IN NACL 1000-0.9 UT/500ML-% IV SOLN
INTRAVENOUS | Status: DC | PRN
  Administered 2018-07-21 (×2): 500 mL

## 2018-07-21 MED ORDER — LIDOCAINE HCL (PF) 1 % IJ SOLN
INTRAMUSCULAR | Status: DC | PRN
  Administered 2018-07-21: 2 mL via INTRADERMAL

## 2018-07-21 MED ORDER — FENTANYL CITRATE (PF) 100 MCG/2ML IJ SOLN
INTRAMUSCULAR | Status: DC | PRN
  Administered 2018-07-21: 25 ug via INTRAVENOUS

## 2018-07-21 MED ORDER — HEPARIN SODIUM (PORCINE) 1000 UNIT/ML IJ SOLN
INTRAMUSCULAR | Status: AC
  Filled 2018-07-21: qty 1

## 2018-07-21 MED ORDER — MIDAZOLAM HCL 2 MG/2ML IJ SOLN
INTRAMUSCULAR | Status: AC
  Filled 2018-07-21: qty 2

## 2018-07-21 MED ORDER — MIDAZOLAM HCL 2 MG/2ML IJ SOLN
INTRAMUSCULAR | Status: DC | PRN
  Administered 2018-07-21: 0.5 mg via INTRAVENOUS

## 2018-07-21 MED ORDER — SODIUM CHLORIDE 0.9 % WEIGHT BASED INFUSION
3.0000 mL/kg/h | INTRAVENOUS | Status: AC
  Administered 2018-07-21: 3 mL/kg/h via INTRAVENOUS

## 2018-07-21 MED ORDER — ACETAMINOPHEN 325 MG PO TABS: 650.0000 mg | ORAL_TABLET | ORAL | Status: DC | PRN

## 2018-07-21 MED ORDER — FENTANYL CITRATE (PF) 100 MCG/2ML IJ SOLN
INTRAMUSCULAR | Status: AC
  Filled 2018-07-21: qty 2

## 2018-07-21 MED ORDER — ONDANSETRON HCL 4 MG/2ML IJ SOLN: 4.0000 mg | Freq: Four times a day (QID) | INTRAMUSCULAR | Status: DC | PRN

## 2018-07-21 SURGICAL SUPPLY — 13 items
CATH BALLN WEDGE 5F 110CM (CATHETERS) ×2 IMPLANT
CATH INFINITI 5 FR JL3.5 (CATHETERS) ×2 IMPLANT
CATH INFINITI 5FR JL4 (CATHETERS) ×2 IMPLANT
CATH INFINITI JR4 5F (CATHETERS) ×2 IMPLANT
DEVICE RAD COMP TR BAND LRG (VASCULAR PRODUCTS) ×2 IMPLANT
GLIDESHEATH SLEND SS 6F .021 (SHEATH) ×2 IMPLANT
GUIDEWIRE INQWIRE 1.5J.035X260 (WIRE) ×1 IMPLANT
INQWIRE 1.5J .035X260CM (WIRE) ×2
KIT HEART LEFT (KITS) ×2 IMPLANT
PACK CARDIAC CATHETERIZATION (CUSTOM PROCEDURE TRAY) ×2 IMPLANT
SHEATH GLIDE SLENDER 4/5FR (SHEATH) ×2 IMPLANT
TRANSDUCER W/STOPCOCK (MISCELLANEOUS) ×2 IMPLANT
TUBING CIL FLEX 10 FLL-RA (TUBING) ×2 IMPLANT

## 2018-07-21 NOTE — Interval H&P Note (Signed)
History and Physical Interval Note:  07/21/2018 12:32 PM  [name] [name]  has presented today for surgery, with the diagnosis of as  The various methods of treatment have been discussed with the patient and family. After consideration of risks, benefits and other options for treatment, the patient has consented to  Procedure(s): RIGHT/LEFT HEART CATH AND CORONARY ANGIOGRAPHY (N/A) as a surgical intervention .  The patient's history has been reviewed, patient examined, no change in status, stable for surgery.  I have reviewed the patient's chart and labs.  Questions were answered to the patient's satisfaction.     Sherren Mocha

## 2018-07-21 NOTE — Discharge Instructions (Signed)
**Note -identified via Obfuscation** Radial Site Care °Refer to this sheet in the next few weeks. These instructions provide you with information about caring for yourself after your procedure. Your health care provider may also give you more specific instructions. Your treatment has been planned according to current medical practices, but problems sometimes occur. Call your health care provider if you have any problems or questions after your procedure. °What can I expect after the procedure? °After your procedure, it is typical to have the following: °· Bruising at the radial site that usually fades within 1-2 weeks. °· Blood collecting in the tissue (hematoma) that may be painful to the touch. It should usually decrease in size and tenderness within 1-2 weeks. ° °Follow these instructions at home: °· Take medicines only as directed by your health care provider. °· You may shower 24-48 hours after the procedure or as directed by your health care provider. Remove the bandage (dressing) and gently wash the site with plain soap and water. Pat the area dry with a clean towel. Do not rub the site, because this may cause bleeding. °· Do not take baths, swim, or use a hot tub until your health care provider approves. °· Check your insertion site every day for redness, swelling, or drainage. °· Do not apply powder or lotion to the site. °· Do not flex or bend the affected arm for 24 hours or as directed by your health care provider. °· Do not push or pull heavy objects with the affected arm for 24 hours or as directed by your health care provider. °· Do not lift over 10 lb (4.5 kg) for 5 days after your procedure or as directed by your health care provider. °· Ask your health care provider when it is okay to: °? Return to work or school. °? Resume usual physical activities or sports. °? Resume sexual activity. °· Do not drive home if you are discharged the same day as the procedure. Have someone else drive you. °· You may drive 24 hours after the procedure  unless otherwise instructed by your health care provider. °· Do not operate machinery or power tools for 24 hours after the procedure. °· If your procedure was done as an outpatient procedure, which means that you went home the same day as your procedure, a responsible adult should be with you for the first 24 hours after you arrive home. °· Keep all follow-up visits as directed by your health care provider. This is important. °Contact a health care provider if: °· You have a fever. °· You have chills. °· You have increased bleeding from the radial site. Hold pressure on the site. °Get help right away if: °· You have unusual pain at the radial site. °· You have redness, warmth, or swelling at the radial site. °· You have drainage (other than a small amount of blood on the dressing) from the radial site. °· The radial site is bleeding, and the bleeding does not stop after 30 minutes of holding steady pressure on the site. °· Your arm or hand becomes pale, cool, tingly, or numb. °This information is not intended to replace advice given to you by your health care provider. Make sure you discuss any questions you have with your health care provider. °Document Released: 12/13/2010 Document Revised: 04/17/2016 Document Reviewed: 05/29/2014 °Elsevier Interactive Patient Education © 2018 [employer] ° °

## 2018-07-21 NOTE — Telephone Encounter (Signed)
New Message:    FYI: Home Health has been delayed until Friday due to surgery today

## 2018-07-21 NOTE — Telephone Encounter (Signed)
Will forward this to Dr Meda Coffee as an Juluis Rainier.

## 2018-07-23 DIAGNOSIS — I110 Hypertensive heart disease with heart failure: Secondary | ICD-10-CM | POA: Diagnosis not present

## 2018-07-23 DIAGNOSIS — I5032 Chronic diastolic (congestive) heart failure: Secondary | ICD-10-CM | POA: Diagnosis not present

## 2018-07-23 DIAGNOSIS — I350 Nonrheumatic aortic (valve) stenosis: Secondary | ICD-10-CM | POA: Diagnosis not present

## 2018-07-23 DIAGNOSIS — I2510 Atherosclerotic heart disease of native coronary artery without angina pectoris: Secondary | ICD-10-CM | POA: Diagnosis not present

## 2018-07-23 DIAGNOSIS — I482 Chronic atrial fibrillation: Secondary | ICD-10-CM | POA: Diagnosis not present

## 2018-07-23 DIAGNOSIS — R2681 Unsteadiness on feet: Secondary | ICD-10-CM | POA: Diagnosis not present

## 2018-07-27 DIAGNOSIS — I110 Hypertensive heart disease with heart failure: Secondary | ICD-10-CM | POA: Diagnosis not present

## 2018-07-27 DIAGNOSIS — I2510 Atherosclerotic heart disease of native coronary artery without angina pectoris: Secondary | ICD-10-CM | POA: Diagnosis not present

## 2018-07-27 DIAGNOSIS — R2681 Unsteadiness on feet: Secondary | ICD-10-CM | POA: Diagnosis not present

## 2018-07-27 DIAGNOSIS — I5032 Chronic diastolic (congestive) heart failure: Secondary | ICD-10-CM | POA: Diagnosis not present

## 2018-07-27 DIAGNOSIS — I350 Nonrheumatic aortic (valve) stenosis: Secondary | ICD-10-CM | POA: Diagnosis not present

## 2018-07-27 DIAGNOSIS — I482 Chronic atrial fibrillation: Secondary | ICD-10-CM | POA: Diagnosis not present

## 2018-07-29 ENCOUNTER — Telehealth: Payer: Self-pay | Admitting: Adult Health

## 2018-07-29 DIAGNOSIS — I2510 Atherosclerotic heart disease of native coronary artery without angina pectoris: Secondary | ICD-10-CM | POA: Diagnosis not present

## 2018-07-29 DIAGNOSIS — I110 Hypertensive heart disease with heart failure: Secondary | ICD-10-CM | POA: Diagnosis not present

## 2018-07-29 DIAGNOSIS — I482 Chronic atrial fibrillation: Secondary | ICD-10-CM | POA: Diagnosis not present

## 2018-07-29 DIAGNOSIS — R2681 Unsteadiness on feet: Secondary | ICD-10-CM | POA: Diagnosis not present

## 2018-07-29 DIAGNOSIS — I5032 Chronic diastolic (congestive) heart failure: Secondary | ICD-10-CM | POA: Diagnosis not present

## 2018-07-29 DIAGNOSIS — I350 Nonrheumatic aortic (valve) stenosis: Secondary | ICD-10-CM | POA: Diagnosis not present

## 2018-07-29 NOTE — Telephone Encounter (Signed)
Ok for verbal orders ?

## 2018-07-29 NOTE — Telephone Encounter (Signed)
Copied from [place] [phone]. Topic: Quick Communication - See Telephone Encounter >> Jul 29, 2018  4:00 PM Vernona Rieger wrote: CRM for notification. See Telephone encounter for: 07/29/18.  Laurey Arrow, physical therapist with [facility] said he saw the patient today and he would like to see the patient for 2 week 2 and 1 week 3 for physical therapy. Call back @ [phone]

## 2018-07-30 ENCOUNTER — Ambulatory Visit: Payer: Medicare Other | Admitting: Cardiology

## 2018-07-30 NOTE — Telephone Encounter (Signed)
Spoke to [place] and notified him to proceed with PT.  No further action required.

## 2018-08-03 ENCOUNTER — Telehealth: Payer: Self-pay | Admitting: Adult Health

## 2018-08-03 DIAGNOSIS — I2510 Atherosclerotic heart disease of native coronary artery without angina pectoris: Secondary | ICD-10-CM | POA: Diagnosis not present

## 2018-08-03 DIAGNOSIS — R2681 Unsteadiness on feet: Secondary | ICD-10-CM | POA: Diagnosis not present

## 2018-08-03 DIAGNOSIS — I482 Chronic atrial fibrillation: Secondary | ICD-10-CM | POA: Diagnosis not present

## 2018-08-03 DIAGNOSIS — I110 Hypertensive heart disease with heart failure: Secondary | ICD-10-CM | POA: Diagnosis not present

## 2018-08-03 DIAGNOSIS — I5032 Chronic diastolic (congestive) heart failure: Secondary | ICD-10-CM | POA: Diagnosis not present

## 2018-08-03 DIAGNOSIS — I350 Nonrheumatic aortic (valve) stenosis: Secondary | ICD-10-CM | POA: Diagnosis not present

## 2018-08-03 NOTE — Telephone Encounter (Signed)
Should they contact cardiology?

## 2018-08-03 NOTE — Telephone Encounter (Signed)
Mardene Celeste, LPN with [facility] called and says "the patient had a 4 lb weight gain in the past 24 hours. He is not having any respiratory symptoms, no SOB. VS-BP112/60, P69, R18 O2 sat 97%. He has 3+ pitting edema to BLE from the knee down." Advised this will be sent to the provider for review and someone will call with the recommendation, she verbalized understanding.

## 2018-08-03 NOTE — Telephone Encounter (Signed)
Ideally

## 2018-08-04 NOTE — Telephone Encounter (Signed)
Spoke to [place] and informed her to call cardiology.  She agreed.  No further action required.

## 2018-08-05 DIAGNOSIS — R2681 Unsteadiness on feet: Secondary | ICD-10-CM | POA: Diagnosis not present

## 2018-08-05 DIAGNOSIS — I5032 Chronic diastolic (congestive) heart failure: Secondary | ICD-10-CM | POA: Diagnosis not present

## 2018-08-05 DIAGNOSIS — I482 Chronic atrial fibrillation: Secondary | ICD-10-CM | POA: Diagnosis not present

## 2018-08-05 DIAGNOSIS — I2510 Atherosclerotic heart disease of native coronary artery without angina pectoris: Secondary | ICD-10-CM | POA: Diagnosis not present

## 2018-08-05 DIAGNOSIS — I110 Hypertensive heart disease with heart failure: Secondary | ICD-10-CM | POA: Diagnosis not present

## 2018-08-05 DIAGNOSIS — I350 Nonrheumatic aortic (valve) stenosis: Secondary | ICD-10-CM | POA: Diagnosis not present

## 2018-08-06 DIAGNOSIS — I110 Hypertensive heart disease with heart failure: Secondary | ICD-10-CM | POA: Diagnosis not present

## 2018-08-06 DIAGNOSIS — I482 Chronic atrial fibrillation: Secondary | ICD-10-CM | POA: Diagnosis not present

## 2018-08-06 DIAGNOSIS — I5032 Chronic diastolic (congestive) heart failure: Secondary | ICD-10-CM | POA: Diagnosis not present

## 2018-08-06 DIAGNOSIS — I2510 Atherosclerotic heart disease of native coronary artery without angina pectoris: Secondary | ICD-10-CM | POA: Diagnosis not present

## 2018-08-06 DIAGNOSIS — I350 Nonrheumatic aortic (valve) stenosis: Secondary | ICD-10-CM | POA: Diagnosis not present

## 2018-08-06 DIAGNOSIS — R2681 Unsteadiness on feet: Secondary | ICD-10-CM | POA: Diagnosis not present

## 2018-08-09 DIAGNOSIS — I350 Nonrheumatic aortic (valve) stenosis: Secondary | ICD-10-CM | POA: Diagnosis not present

## 2018-08-09 DIAGNOSIS — I5032 Chronic diastolic (congestive) heart failure: Secondary | ICD-10-CM | POA: Diagnosis not present

## 2018-08-09 DIAGNOSIS — I482 Chronic atrial fibrillation: Secondary | ICD-10-CM | POA: Diagnosis not present

## 2018-08-09 DIAGNOSIS — I110 Hypertensive heart disease with heart failure: Secondary | ICD-10-CM | POA: Diagnosis not present

## 2018-08-09 DIAGNOSIS — R2681 Unsteadiness on feet: Secondary | ICD-10-CM | POA: Diagnosis not present

## 2018-08-09 DIAGNOSIS — I2510 Atherosclerotic heart disease of native coronary artery without angina pectoris: Secondary | ICD-10-CM | POA: Diagnosis not present

## 2018-08-10 ENCOUNTER — Telehealth: Payer: Self-pay | Admitting: Cardiology

## 2018-08-10 DIAGNOSIS — I5032 Chronic diastolic (congestive) heart failure: Secondary | ICD-10-CM | POA: Diagnosis not present

## 2018-08-10 DIAGNOSIS — I482 Chronic atrial fibrillation: Secondary | ICD-10-CM | POA: Diagnosis not present

## 2018-08-10 DIAGNOSIS — I350 Nonrheumatic aortic (valve) stenosis: Secondary | ICD-10-CM | POA: Diagnosis not present

## 2018-08-10 DIAGNOSIS — R2681 Unsteadiness on feet: Secondary | ICD-10-CM | POA: Diagnosis not present

## 2018-08-10 DIAGNOSIS — I110 Hypertensive heart disease with heart failure: Secondary | ICD-10-CM | POA: Diagnosis not present

## 2018-08-10 DIAGNOSIS — I2510 Atherosclerotic heart disease of native coronary artery without angina pectoris: Secondary | ICD-10-CM | POA: Diagnosis not present

## 2018-08-10 NOTE — Telephone Encounter (Signed)
New Message  Pt c/o swelling: STAT is pt has developed SOB within 24 hours  1) How much weight have you gained and in what time span? 3lb  Since yesterday  2) If swelling, where is the swelling located? 3+ pitting edema in lower extremities   3) Are you currently taking a fluid pill? Yes, but home health nurse wants clarification on dosage  4) Are you currently SOB? No but does have a dry cough  5) Do you have a log of your daily weights (if so, list)? 185 yesterday and 188 today  6) Have you gained 3 pounds in a day or 5 pounds in a week? 3 pounds in a day  7) Have you traveled recently? no

## 2018-08-10 NOTE — Telephone Encounter (Signed)
Home health RN [name] is calling to let Dr Meda Coffee and myself know that pre-med administration of his lasix and metolazone today, the pt had a dry weight of 188 lbs, and yesterday was 185 lbs.  Pt had NOT taken his morning lasix and Metolazone 2.5 mg po 3 x week (today being one of his administration days of this med).  Per [name] RN, the pt has bilateral lower extremity, with 3+ pitting edema, and a dry cough [name] states which is baseline for him and has had for weeks now).  Per [name] RN, his lungs are clear bilaterally, he has no complaints of SOB, DOE, CP, or any other cardiac related symptoms.  Per [name] RN, the pt states he is being compliant with taking all cardiac meds, and diuretic meds.  [name] states that the pts daughters provide him with meal prep, but the majority of his meals are from take out restaurants, so his sodium intake is unknown.  [name] RN states the pt is stable and resting comfortably at home.  She just wanted to make Dr Meda Coffee aware of this.  [name] states that if Dr Meda Coffee increases his diuretic meds, more than likely, the pt will not take it, for the daughters are having an extreme difficulty making him take his current dose of lasix and metolazone.  Informed [name] that I will route this information to Dr Meda Coffee to review and advise on as needed.  [name] that if the pts weight continues to increase with diuresis on board, and/or becomes symptomatic with increased sob, doe, cp or any other cardiac related symptoms, then the pt should refer to the ER for further eval and possible diuresis.  [name] to reiterate to the daughters that the pt needs to consume a low sodium diet, avoid take-out if possible, avoid processed foods, or any other additives containing salt.  [name] verbalized understanding and agrees with this plan.

## 2018-08-11 DIAGNOSIS — R2681 Unsteadiness on feet: Secondary | ICD-10-CM | POA: Diagnosis not present

## 2018-08-11 DIAGNOSIS — I2510 Atherosclerotic heart disease of native coronary artery without angina pectoris: Secondary | ICD-10-CM | POA: Diagnosis not present

## 2018-08-11 DIAGNOSIS — I350 Nonrheumatic aortic (valve) stenosis: Secondary | ICD-10-CM | POA: Diagnosis not present

## 2018-08-11 DIAGNOSIS — I110 Hypertensive heart disease with heart failure: Secondary | ICD-10-CM | POA: Diagnosis not present

## 2018-08-11 DIAGNOSIS — I5032 Chronic diastolic (congestive) heart failure: Secondary | ICD-10-CM | POA: Diagnosis not present

## 2018-08-11 DIAGNOSIS — I482 Chronic atrial fibrillation: Secondary | ICD-10-CM | POA: Diagnosis not present

## 2018-08-11 NOTE — Telephone Encounter (Signed)
° °  [facility] returning call Nurse unavailable.  Please return call to [place] at [phone]

## 2018-08-11 NOTE — Telephone Encounter (Signed)
Left a message for both the pts home health RN and pts daughter to call the office back to endorse recommendations per Dr Meda Coffee.

## 2018-08-11 NOTE — Telephone Encounter (Signed)
Please have him take his metolazone today, elevate his legs and use compression socks

## 2018-08-11 NOTE — Telephone Encounter (Signed)
Spoke with [name] RN that cares for the pt in the home, and endorsed to her that per Dr Meda Coffee, the pt should take 1 extra metolazone today, and elevate his lower extremities and wear his compression stockings.  Informed [name] that I tried to call the daughter to endorse plan and there was no answer, so I left a VM to call back.  [name] RN verbalized understanding and agrees with this plan.  Per [name] RN, she will endorse this to the pt.

## 2018-08-11 NOTE — Telephone Encounter (Signed)
Spoke with the pts daughter and informed her that per Dr Meda Coffee, she recommends that the pt take an extra metolazone today, and wear his compression stockings and elevate his legs, while at rest.  Informed the pts daughter that I already endorsed these orders to his home health Nurse [name], and she was going to make sure the pt followed through on this.  Daughter [name] [name] verbalized understanding and agrees with this plan.

## 2018-08-12 ENCOUNTER — Ambulatory Visit (HOSPITAL_COMMUNITY)
Admission: RE | Admit: 2018-08-12 | Discharge: 2018-08-12 | Disposition: A | Discharge status: Home / Self Care | Payer: Medicare Other | Source: Ambulatory Visit | Attending: Cardiovascular Disease | Admitting: Cardiovascular Disease

## 2018-08-12 ENCOUNTER — Other Ambulatory Visit: Payer: Self-pay | Admitting: Cardiology

## 2018-08-12 DIAGNOSIS — J438 Other emphysema: Secondary | ICD-10-CM | POA: Insufficient documentation

## 2018-08-12 DIAGNOSIS — I517 Cardiomegaly: Secondary | ICD-10-CM | POA: Diagnosis not present

## 2018-08-12 DIAGNOSIS — I350 Nonrheumatic aortic (valve) stenosis: Secondary | ICD-10-CM | POA: Insufficient documentation

## 2018-08-12 DIAGNOSIS — I700 Atherosclerosis of aorta: Secondary | ICD-10-CM | POA: Diagnosis not present

## 2018-08-12 DIAGNOSIS — R9431 Abnormal electrocardiogram [ECG] [EKG]: Secondary | ICD-10-CM | POA: Insufficient documentation

## 2018-08-12 DIAGNOSIS — I2510 Atherosclerotic heart disease of native coronary artery without angina pectoris: Secondary | ICD-10-CM | POA: Diagnosis not present

## 2018-08-12 DIAGNOSIS — I5043 Acute on chronic combined systolic (congestive) and diastolic (congestive) heart failure: Secondary | ICD-10-CM

## 2018-08-12 DIAGNOSIS — I358 Other nonrheumatic aortic valve disorders: Secondary | ICD-10-CM | POA: Diagnosis not present

## 2018-08-12 DIAGNOSIS — Z01818 Encounter for other preprocedural examination: Secondary | ICD-10-CM | POA: Diagnosis not present

## 2018-08-12 DIAGNOSIS — J90 Pleural effusion, not elsewhere classified: Secondary | ICD-10-CM | POA: Insufficient documentation

## 2018-08-12 DIAGNOSIS — E785 Hyperlipidemia, unspecified: Secondary | ICD-10-CM

## 2018-08-12 DIAGNOSIS — I4891 Unspecified atrial fibrillation: Secondary | ICD-10-CM | POA: Insufficient documentation

## 2018-08-12 DIAGNOSIS — J432 Centrilobular emphysema: Secondary | ICD-10-CM | POA: Diagnosis not present

## 2018-08-12 LAB — PULMONARY FUNCTION TEST
DL/VA % PRED: 50 %
DL/VA: 2.31 ml/min/mmHg/L
DLCO unc % pred: 34 %
DLCO unc: 11.79 ml/min/mmHg
FEF 25-75 POST: 0.95 L/s
FEF 25-75 Pre: 0.51 L/sec
FEF2575-%CHANGE-POST: 85 %
FEF2575-%PRED-POST: 62 %
FEF2575-%Pred-Pre: 33 %
FEV1-%Change-Post: 20 %
FEV1-%PRED-PRE: 63 %
FEV1-%Pred-Post: 76 %
FEV1-PRE: 1.6 L
FEV1-Post: 1.92 L
FEV1FVC-%Change-Post: 5 %
FEV1FVC-%PRED-PRE: 74 %
FEV6-%Change-Post: 15 %
FEV6-%Pred-Post: 92 %
FEV6-%Pred-Pre: 79 %
FEV6-POST: 3.14 L
FEV6-Pre: 2.71 L
FEV6FVC-%Change-Post: 1 %
FEV6FVC-%PRED-POST: 96 %
FEV6FVC-%Pred-Pre: 94 %
FVC-%CHANGE-POST: 13 %
FVC-%PRED-POST: 95 %
FVC-%Pred-Pre: 84 %
FVC-Post: 3.53 L
FVC-Pre: 3.11 L
PRE FEV6/FVC RATIO: 87 %
Post FEV1/FVC ratio: 54 %
Post FEV6/FVC ratio: 89 %
Pre FEV1/FVC ratio: 51 %
RV % pred: 112 %
RV: 3.3 L
TLC % pred: 87 %
TLC: 6.42 L

## 2018-08-12 MED ORDER — IOPAMIDOL (ISOVUE-370) INJECTION 76%
100.0000 mL | Freq: Once | INTRAVENOUS | Status: AC | PRN
  Administered 2018-08-12: 100 mL via INTRAVENOUS

## 2018-08-12 MED ORDER — ALBUTEROL SULFATE (2.5 MG/3ML) 0.083% IN NEBU
2.5000 mg | INHALATION_SOLUTION | Freq: Once | RESPIRATORY_TRACT | Status: AC
  Administered 2018-08-12: 2.5 mg via RESPIRATORY_TRACT

## 2018-08-12 NOTE — Progress Notes (Signed)
Patient in radiology department for CT scan of heart for (TAVR). Patient connected to monitor and heart rate in 40s with frequent periods of bradycardia in 30's with PVCs. Blood pressure 116/56. Patient denies any shortness of breath, chest pain, and states that he "feels just fine." Dr. Meda Coffee informed of heart rate and order given for 12 lead EKG. EKG showed afib with slow ventricular response and Dr. Meda Coffee made aware. Verbal order given from Dr. Meda Coffee to tell patient to stop taking digoxin. Patient and daughter informed of medication changes and both state their understanding that patient needs to stop taking Digoxin per Dr. Orlean Patten. Copy of updated medication list given to daughter.

## 2018-08-12 NOTE — Addendum Note (Signed)
Addended by: Nuala Alpha on: 08/12/2018 09:39 AM   Modules accepted: Orders

## 2018-08-12 NOTE — Telephone Encounter (Signed)
Verbal orders given by Dr Meda Coffee this morning on the pt to STOP his digoxin.  This is because pt is currently having his scans done at the hospital for TAVR work-up, and is noted to be in afib.  RN at the hospital was made aware of verbal orders to tell the pt to stop digoxin as well. Dr Meda Coffee endorsed these orders to the pt. Change reflected in the pts chart.

## 2018-08-13 DIAGNOSIS — I482 Chronic atrial fibrillation: Secondary | ICD-10-CM | POA: Diagnosis not present

## 2018-08-13 DIAGNOSIS — I110 Hypertensive heart disease with heart failure: Secondary | ICD-10-CM | POA: Diagnosis not present

## 2018-08-13 DIAGNOSIS — I2510 Atherosclerotic heart disease of native coronary artery without angina pectoris: Secondary | ICD-10-CM | POA: Diagnosis not present

## 2018-08-13 DIAGNOSIS — I5032 Chronic diastolic (congestive) heart failure: Secondary | ICD-10-CM | POA: Diagnosis not present

## 2018-08-13 DIAGNOSIS — I350 Nonrheumatic aortic (valve) stenosis: Secondary | ICD-10-CM | POA: Diagnosis not present

## 2018-08-13 DIAGNOSIS — R2681 Unsteadiness on feet: Secondary | ICD-10-CM | POA: Diagnosis not present

## 2018-08-17 DIAGNOSIS — I110 Hypertensive heart disease with heart failure: Secondary | ICD-10-CM | POA: Diagnosis not present

## 2018-08-17 DIAGNOSIS — I5032 Chronic diastolic (congestive) heart failure: Secondary | ICD-10-CM | POA: Diagnosis not present

## 2018-08-17 DIAGNOSIS — I482 Chronic atrial fibrillation: Secondary | ICD-10-CM | POA: Diagnosis not present

## 2018-08-17 DIAGNOSIS — I350 Nonrheumatic aortic (valve) stenosis: Secondary | ICD-10-CM | POA: Diagnosis not present

## 2018-08-17 DIAGNOSIS — I2510 Atherosclerotic heart disease of native coronary artery without angina pectoris: Secondary | ICD-10-CM | POA: Diagnosis not present

## 2018-08-17 DIAGNOSIS — R2681 Unsteadiness on feet: Secondary | ICD-10-CM | POA: Diagnosis not present

## 2018-08-20 ENCOUNTER — Telehealth: Payer: Self-pay | Admitting: Cardiology

## 2018-08-20 DIAGNOSIS — I110 Hypertensive heart disease with heart failure: Secondary | ICD-10-CM | POA: Diagnosis not present

## 2018-08-20 DIAGNOSIS — R2681 Unsteadiness on feet: Secondary | ICD-10-CM | POA: Diagnosis not present

## 2018-08-20 DIAGNOSIS — I482 Chronic atrial fibrillation: Secondary | ICD-10-CM | POA: Diagnosis not present

## 2018-08-20 DIAGNOSIS — I5032 Chronic diastolic (congestive) heart failure: Secondary | ICD-10-CM | POA: Diagnosis not present

## 2018-08-20 DIAGNOSIS — I350 Nonrheumatic aortic (valve) stenosis: Secondary | ICD-10-CM | POA: Diagnosis not present

## 2018-08-20 DIAGNOSIS — I2510 Atherosclerotic heart disease of native coronary artery without angina pectoris: Secondary | ICD-10-CM | POA: Diagnosis not present

## 2018-08-20 NOTE — Telephone Encounter (Signed)
Returned call to [employer] RN. She states that when he weighed this morning before his meds that his weight was up 5 lbs from yesterday. She states that the patient does have some LEE but it is not any more than it usually is. She states that the patient is totally asymptomatic and does not have any SOB, chest pain, DOE, or any other Sx. She states that the patient's daughters bring him fast food everyday for his meals. Instructed for her to provide education to the patient as well as his family that he needs to follow a low sodium diet and needs to not eat fast food everyday. The patient has since taken his lasix 40 mg today as is urinating well. The patient also takes metolazone 2.5 mg three times a week. He is on a T/Th/Sa schedule. BP has been around 118/60. Instructed for the patient to also elevate his legs and wear compression stockings to help with his swelling. Instructed for the patient to let us know if he becomes symptomatic or if he continues to gain weight. They requested an Rx for compression stockings. Rx left up front to pick up.

## 2018-08-20 NOTE — Telephone Encounter (Signed)
New message  Pt c/o swelling: STAT is pt has developed SOB within 24 hours  1) How much weight have you gained and in what time span? 5 lbs within a day  2) If swelling, where is the swelling located? Bilateral lower extremities   3) Are you currently taking a fluid pill? Yes   4) Are you currently SOB? No   5) Do you have a log of your daily weights (if so, list)?he does have log, can provide log   6) Have you gained 3 pounds in a day or 5 pounds in a week? Yes   7) Have you traveled recently? No

## 2018-08-23 ENCOUNTER — Encounter: Payer: Self-pay | Admitting: Thoracic Surgery (Cardiothoracic Vascular Surgery)

## 2018-08-23 ENCOUNTER — Ambulatory Visit: Payer: Medicare Other | Admitting: Physical Therapy

## 2018-08-23 ENCOUNTER — Other Ambulatory Visit: Payer: Self-pay

## 2018-08-23 ENCOUNTER — Institutional Professional Consult (permissible substitution) (INDEPENDENT_AMBULATORY_CARE_PROVIDER_SITE_OTHER): Payer: Medicare Other | Admitting: Thoracic Surgery (Cardiothoracic Vascular Surgery)

## 2018-08-23 VITALS — BP 100/60 | HR 82 | Resp 18 | Ht 71.5 in | Wt 179.0 lb

## 2018-08-23 DIAGNOSIS — I350 Nonrheumatic aortic (valve) stenosis: Secondary | ICD-10-CM | POA: Diagnosis not present

## 2018-08-23 DIAGNOSIS — I2510 Atherosclerotic heart disease of native coronary artery without angina pectoris: Secondary | ICD-10-CM

## 2018-08-23 MED ORDER — FUROSEMIDE 40 MG PO TABS: 40.0000 mg | ORAL_TABLET | Freq: Two times a day (BID) | ORAL | 0 refills | Status: DC

## 2018-08-23 NOTE — Progress Notes (Addendum)
HEART AND [place] SURGERY CONSULTATION REPORT  Referring Provider is Dorothy Spark, MD PCP is [name], NP  Chief Complaint  Patient presents with  . 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  Patient's cardiac history dates back to the early 1990s when he reportedly suffered an acute myocardial infarction.  He reportedly underwent cardiac catheterization at that time and was found to have normal coronary arteries.  He developed persistent atrial fibrillation and has remained on long-term warfarin anticoagulation ever since.  He was followed for many years by Dr. Velora Heckler and more recently has been followed by Dr. Meda Coffee.  He has had problems with chronic lower extremity edema.  Echocardiograms have demonstrated the presence of low normal left ventricular systolic function with aortic stenosis that has gradually progressed in severity.  Echocardiogram performed March 02, 2018 revealed severe aortic stenosis.  Peak velocity across aortic valve measured 4.4 m/s corresponding to mean transvalvular gradient estimated 43 mmHg.  The DVI was reportedly only 0.18.  Left ventricular systolic function remains stable with ejection fraction estimated 50 to 55%.  In May 2019 he was evaluated in the emergency  department with a brief episode of chest pain relieved by nitroglycerin.  Troponins were negative.  He was seen in follow-up by Truitt Merle in July and subsequently the patient was referred to the multidisciplinary heart valve clinic for evaluation of his aortic stenosis.  He was seen in consultation by Dr. Burt Knack on July 15, 2018 and diagnostic cardiac catheterization was performed July 21, 2018.  Catheterization revealed coronary artery disease with 70% proximal stenosis of the left anterior descending coronary artery and otherwise moderate nonobstructive disease.  Right heart pressures and cardiac output were normal.  CT angiography was performed and the patient has been referred for surgical consultation.  The patient is married but lives alone in his home locally in [place].  His wife suffers from Alzheimer's type dementia and is currently in a memory unit in a local skilled nursing facility.  Patient has 2 adult children including one daughter who accompanies him for his office visit today.  The patient admits that he is very sedentary.  His activity level has diminished considerably over the last few years.  He hardly ever gets out of the house although recently a physical therapist has been taking him for short walks out of doors.  He is unsteady on his feet and now uses a rolling walker to steady his balance.  He denies any symptoms of exertional shortness of breath or chest discomfort.  He admits that he is chronically fatigued.  His daughter states that he frequently falls asleep during the daytime.  His appetite is stable.  He has not had syncope.  He has occasional mild dizziness when he stands up.  He has chronic swelling in both lower legs.  He denies PND but he does report feeling short of breath if he lays flat in bed.  Past Medical History:  Diagnosis Date  . Atrial fibrillation, chronic (HCC)    a. on coumadin   . Carotid artery occlusion    right total internal artery occlusion    . CHF (congestive heart failure) ([place])   . Coronary artery disease    s/p cardiac cath in 1990s, and cardiolite study  in 2005 showing EF 54%  . Degenerative disc disease    cervical spine  . GERD (gastroesophageal reflux disease)   . Hyperlipidemia   . Hypertension   . Hypothyroidism   . Rheumatoid arthritis ([place])   . Skin cancer    forehead  . TIA (transient ischemic attack)   . Urgency incontinence     Past Surgical History:  Procedure Laterality Date  . 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 CARDIAC CATHETERIZATION  1990s  . CATARACT EXTRACTION W/ INTRAOCULAR LENS  IMPLANT, BILATERAL    . EYE SURGERY    . RIGHT/LEFT HEART CATH AND CORONARY ANGIOGRAPHY N/A 07/21/2018   Procedure: RIGHT/LEFT HEART CATH AND CORONARY ANGIOGRAPHY;  Surgeon: Sherren Mocha, MD;  Location: [facility];  Service: Cardiovascular;  Laterality: N/A;  . SKIN CANCER EXCISION  12/2011   forehead    Family History  Problem Relation Age of Onset  . Heart disease Mother   . Malignant hyperthermia Mother   . Hypertension Mother   . Heart disease Father   . Malignant hyperthermia Father   . Hyperlipidemia Father   . Hypertension Father   . Heart disease Sister   . Hypertension Sister   . Heart disease Brother     Social History   Socioeconomic History  . Marital status: Married    Spouse name: Not on file  . Number of children: Not on file  . Years of education: Not on file  . Highest education level: Not on file  Occupational History  . Not on file  Social Needs  . Financial resource strain: Not on file  . Food insecurity:    Worry: Not on file    Inability: Not on file  . Transportation needs:    Medical: Not on file    Non-medical: Not on file  Tobacco Use  . Smoking status: Former Smoker    Packs/day: 1.00    Years:  40.00    Pack years: 40.00    Types: Cigarettes    Last attempt to quit: 09/12/1989    Years since quitting: 28.9  . Smokeless tobacco: Never Used  . Tobacco comment: quit around 1992  Substance and Sexual Activity  . Alcohol use: No    Alcohol/week: 0.0 standard drinks    Comment: former, quit drinking in 1992  . Drug use: No  . Sexual activity: Not Currently  Lifestyle  . Physical activity:    Days per week: Not on file    Minutes per session: Not on file  . Stress: Not on file  Relationships  . Social connections:    Talks on phone: Not on file    Gets together: Not on file    Attends religious service: Not on file    Active member of club or organization: Not on file    Attends meetings of clubs or organizations: Not on file    Relationship status: Not on file  . Intimate partner violence:    Fear of current  or ex partner: Not on file    Emotionally abused: Not on file    Physically abused: Not on file    Forced sexual activity: Not on file  Other Topics Concern  . 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 aspirin 81 MG tablet Take 81 mg by mouth at bedtime.     Marland Kitchen atorvastatin (LIPITOR) 10 MG tablet TAKE 1 TABLET AT BEDTIME 90 tablet 3  . Calcium-Vitamin D (CALTRATE 600 PLUS-VIT D PO) Take 1 tablet by mouth at bedtime.     . folic acid (FOLVITE) 1 MG tablet Take 1 mg by mouth daily.     . furosemide (LASIX) 40 MG tablet Take 1 tablet (40 mg total) by mouth 2 (two) times daily. 180 tablet 0  . methotrexate 2.5 MG tablet Take 10 mg by mouth once a week.    . metoprolol succinate (TOPROL-XL) 25 MG 24 hr tablet Take 0.5 tablets (12.5 mg total) by mouth daily. Take with or immediately following a meal. 45 tablet 3  . nitroGLYCERIN (NITROSTAT) 0.4 MG SL tablet  Place 1 tablet (0.4 mg total) under the tongue every 5 (five) minutes as needed for chest pain. 25 tablet 0  . Probiotic Product (RA PROBIOTIC COLON CARE PO) Take 1 capsule by mouth daily.    . tamsulosin (FLOMAX) 0.4 MG CAPS capsule Take 0.4 mg by mouth every evening.    . warfarin (COUMADIN) 5 MG tablet Take 1 tablet daily except 1 1/2 Mon Wed Fri or TAKE AS DIRECTED BY ANTICOAGULATION CLINIC (Patient taking differently: Take 5-7.5 mg by mouth See admin instructions. Take 1 tablet daily except 1 1/2 Mon Wed Fri or TAKE AS DIRECTED BY ANTICOAGULATION CLINIC) 120 tablet 1  . metolazone (ZAROXOLYN) 2.5 MG tablet Take 1 tablet (2.5 mg total) by mouth 3 (three) times a week. Take on Tuesday, Thursday, Saturday. 36 tablet 3   No current facility-administered medications for this visit.     Allergies  Allergen Reactions  . Levofloxacin Rash    "thought they were sticking swords thru me"  . Klor-Con [Potassium Chloride Er] Diarrhea    DIARRHEA      Review of Systems:   General:  normal appetite, decreased energy, no weight gain, no weight loss, no fever  Cardiac:  no chest pain with exertion, no chest pain at rest, no SOB with exertion, no resting SOB, no PND, + orthopnea, no palpitations, + arrhythmia, + atrial fibrillation, + LE edema, + dizzy spells, no syncope  Respiratory:  no shortness of breath, no home oxygen, no productive cough, no dry cough, no bronchitis, no wheezing, no hemoptysis, no asthma, no pain with inspiration or cough, no sleep apnea, no CPAP at night  GI:   no difficulty swallowing, no reflux, no frequent heartburn, no hiatal hernia, no abdominal pain, no constipation, + diarrhea, no hematochezia, no hematemesis, no melena  GU:   no dysuria,  + frequency, no urinary tract infection, no hematuria, no enlarged prostate, no kidney stones, no kidney disease  Vascular:  no pain suggestive of claudication, no pain in feet, no leg cramps, no varicose veins, no DVT, no non-healing  foot ulcer  Neuro:   no stroke, + TIA's, no seizures, no headaches, no  temporary blindness one eye,  no slurred speech, no peripheral neuropathy, no chronic pain, + instability of gait, + memory/cognitive dysfunction  Musculoskeletal: no arthritis, no joint swelling, no myalgias, + difficulty walking, limited mobility   Skin:   no rash, no itching, no skin infections, no pressure sores or ulcerations  Psych:   no anxiety, no depression, + nervousness, no unusual recent stress  Eyes:   no blurry vision, no floaters, no recent vision changes, + wears glasses or contacts  ENT:   no hearing loss, no loose or painful teeth, + dentures, last saw dentist 6 years ago  Hematologic:  no easy bruising, no abnormal bleeding, no clotting disorder, no frequent epistaxis  Endocrine:  no diabetes, does not check CBG's at home           Physical Exam:   BP 100/60 (BP Location: Left Arm, Patient Position: Sitting, Cuff Size: Normal)   Pulse 82   Resp 18   Ht 5' 11.5" (1.816 m)   Wt 179 lb (81.2 kg)   SpO2 96% Comment: RA  BMI 24.62 kg/m   General:  Elderly, frail-appearing  HEENT:  Unremarkable   Neck:   no JVD, no bruits, no adenopathy   Chest:   clear to auscultation, symmetrical breath sounds, no wheezes, no rhonchi   CV:   Irregular rate and rhythm, grade III/VI crescendo/decrescendo murmur heard best at LSB,  no diastolic murmur  Abdomen:  soft, non-tender, no masses   Extremities:  warm, well-perfused, pulses diminished but palpable, + bilateral LE edema  Rectal/GU  Deferred  Neuro:   Grossly non-focal and symmetrical throughout  Skin:   Clean and dry, no rashes, no breakdown   Diagnostic Tests:  Echocardiography  (Report amended )  Patient:    Zae, Kirtz MR #:       [id number] Study Date: 03/02/2018 Gender:     M Age:        89 Height:     181 cm Weight:     85.3 kg BSA:        2.08 m^2 Pt. Status: Room:   SONOGRAPHER  [place], Will  ATTENDING    Sanda Klein,  MD  Livia Snellen     Ena Dawley, M.D.  REFERRING    Ena Dawley, M.D.  PERFORMING   [facility], Outpatient  cc:  ------------------------------------------------------------------- LV EF: 50% -   55%  ------------------------------------------------------------------- Indications:      (I50.43).  ------------------------------------------------------------------- History:   PMH:  Acquired from the patient and from the patient&'s chart.  Atrial fibrillation.  Congestive heart failure.  Aortic stenosis.  Transient ischemic attack.  PMH:   Myocardial infarction.  Risk factors:  Former tobacco use. Hypertension. Dyslipidemia.  ------------------------------------------------------------------- Study Conclusions  - Left ventricle: The cavity size was normal. Wall thickness was   normal. Systolic function was at the lower limits of normal. The   estimated ejection fraction was in the range of 50% to 55%. Wall   motion was normal; there were no regional wall motion   abnormalities. - Aortic valve: There was severe stenosis. There was mild   regurgitation. - Mitral valve: Calcified annulus. There was mild regurgitation   directed centrally. - Left atrium: The atrium was moderately dilated. - Right atrium: The atrium was moderately dilated. - Tricuspid valve: There was moderate regurgitation.  Impressions:  - Aortic stenosis has worsened since the October 2018 study.  ------------------------------------------------------------------- Study data:  Comparison was made to the study of 09/22/2017.  Study status:  Routine.  Procedure:  The patient reported no pain pre or post test. Transthoracic echocardiography for left ventricular function evaluation. Image quality was adequate.  Study completion:  There were no complications.          Echocardiography.  M-mode, complete 2D, spectral Doppler, and color Doppler.  Birthdate: Patient birthdate: Mar 19, 1925.  Age:  Patient is  84 yr old.  Sex: Gender: male.    BMI: 26 kg/m^2.  Blood pressure:     132/74 Patient status:  Outpatient.  Study date:  Study date: 03/02/2018. Study time: 03:23 PM.  Location:  [facility]  -------------------------------------------------------------------  ------------------------------------------------------------------- Left ventricle:  The cavity size was normal. Wall thickness was normal. Systolic function was at the lower limits of normal. The estimated ejection fraction was in the range of 50% to 55%. Wall motion was normal; there were no regional wall motion abnormalities. The study was not technically sufficient to allow evaluation of LV diastolic dysfunction due to atrial fibrillation.   ------------------------------------------------------------------- Aortic valve:   Trileaflet; severely thickened, moderately calcified leaflets.  Doppler:   There was severe stenosis.   The ejection jet is still early peaking. There was mild regurgitation.   VTI ratio of LVOT to aortic valve: 0.17. Valve area (VTI): 0.7 cm^2. Indexed valve area (VTI): 0.34 cm^2/m^2. Peak velocity ratio of LVOT to aortic valve: 0.19. Valve area (Vmax): 0.8 cm^2. Indexed valve area (Vmax): 0.38 cm^2/m^2. Mean velocity ratio of LVOT to aortic valve: 0.18. Valve area (Vmean): 0.73 cm^2. Indexed valve area (Vmean): 0.35 cm^2/m^2.    Mean gradient (S): 43 mm Hg. Peak gradient (S): 78 mm Hg.  ------------------------------------------------------------------- Aorta:  Aortic root: The aortic root was normal in size. Ascending aorta: The ascending aorta was mildly dilated.  ------------------------------------------------------------------- Mitral valve:   Calcified annulus. Leaflet separation was normal. Doppler:  Transvalvular velocity was within the normal range. There was no evidence for stenosis. There was mild regurgitation directed centrally.    Peak gradient (D): 6 mm  Hg.  ------------------------------------------------------------------- Left atrium:  The atrium was moderately dilated.  ------------------------------------------------------------------- Right ventricle:  The cavity size was normal. Systolic function was normal.  ------------------------------------------------------------------- Pulmonic valve:   Poorly visualized.  The valve appears to be grossly normal.   Cusp separation was normal.  Doppler: Transvalvular velocity was within the normal range. There was no regurgitation.  ------------------------------------------------------------------- Tricuspid valve:   Structurally normal valve.   Leaflet separation was normal.  Doppler:  Transvalvular velocity was within the normal range. There was moderate regurgitation.  ------------------------------------------------------------------- Pulmonary artery:   Systolic pressure was within the normal range.   ------------------------------------------------------------------- Right atrium:  The atrium was moderately dilated.  ------------------------------------------------------------------- Pericardium:  There was no pericardial effusion.  ------------------------------------------------------------------- Systemic veins: Inferior vena cava: The vessel was normal in size. The respirophasic diameter changes were in the normal range (= 50%), consistent with normal central venous pressure.  ------------------------------------------------------------------- Measurements   Left ventricle                            Value          Reference  LV ID, ED, PLAX chordal                   47.5  mm       43 - 52  LV ID, ES, PLAX chordal  33.9  mm       23 - 38  LV fx shortening, PLAX chordal            29    %        >=29  LV PW thickness, ED                       15.3  mm       ---------  IVS/LV PW ratio, ED                       0.8            <=1.3  Stroke  volume, 2D                         76    ml       ---------  Stroke volume/bsa, 2D                     37    ml/m^2   ---------    Ventricular septum                        Value          Reference  IVS thickness, ED                         12.3  mm       ---------    LVOT                                      Value          Reference  LVOT ID, S                                23    mm       ---------  LVOT area                                 4.15  cm^2     ---------  LVOT ID                                   23    mm       ---------  LVOT peak velocity, S                     85    cm/s     ---------  LVOT mean velocity, S                     53.6  cm/s     ---------  LVOT VTI, S                               18.4  cm       ---------  LVOT peak gradient, S                     3  mm Hg    ---------  Stroke volume (SV), LVOT DP               76.4  ml       ---------  Stroke index (SV/bsa), LVOT DP            36.7  ml/m^2   ---------    Aortic valve                              Value          Reference  Aortic valve peak velocity, S             443   cm/s     ---------  Aortic valve mean velocity, S             306   cm/s     ---------  Aortic valve VTI, S                       109   cm       ---------  Aortic mean gradient, S                   43    mm Hg    ---------  Aortic peak gradient, S                   78    mm Hg    ---------  VTI ratio, LVOT/AV                        0.17           ---------  Aortic valve area, VTI                    0.7   cm^2     ---------  Aortic valve area/bsa, VTI                0.34  cm^2/m^2 ---------  Velocity ratio, peak, LVOT/AV             0.19           ---------  Aortic valve area, peak velocity          0.8   cm^2     ---------  Aortic valve area/bsa, peak               0.38  cm^2/m^2 ---------  velocity  Velocity ratio, mean, LVOT/AV             0.18           ---------  Aortic valve area, mean velocity          0.73  cm^2     ---------   Aortic valve area/bsa, mean               0.35  cm^2/m^2 ---------  velocity  Aortic regurg pressure half-time          535   ms       ---------    Aorta                                     Value          Reference  Aortic root ID, ED  40    mm       ---------  Ascending aorta ID, A-P, S                40    mm       ---------    Left atrium                               Value          Reference  LA ID, A-P, ES                            46    mm       ---------  LA ID/bsa, A-P                    (H)     2.21  cm/m^2   <=2.2  LA volume, S                              114   ml       ---------  LA volume/bsa, S                          54.8  ml/m^2   ---------  LA volume, ES, 1-p A4C                    108   ml       ---------  LA volume/bsa, ES, 1-p A4C                51.9  ml/m^2   ---------  LA volume, ES, 1-p A2C                    120   ml       ---------  LA volume/bsa, ES, 1-p A2C                57.7  ml/m^2   ---------    Mitral valve                              Value          Reference  Mitral E-wave peak velocity               121   cm/s     ---------  Mitral deceleration time                  222   ms       150 - 230  Mitral peak gradient, D                   6     mm Hg    ---------    Pulmonary arteries                        Value          Reference  PA pressure, S, DP                        27    mm Hg    <=30    Tricuspid valve  Value          Reference  Tricuspid regurg peak velocity            247   cm/s     ---------  Tricuspid peak RV-RA gradient             24    mm Hg    ---------    Systemic veins                            Value          Reference  Estimated CVP                             3     mm Hg    ---------    Right ventricle                           Value          Reference  RV pressure, S, DP                        27    mm Hg    <=30  Legend: (L)  and  (H)  mark values outside specified reference  range.  ------------------------------------------------------------------- Renaldo Harrison, MD 2019-04-09T18:08:58    RIGHT/LEFT HEART CATH AND CORONARY ANGIOGRAPHY  Conclusion     Ost RCA to Prox RCA lesion is 30% stenosed.  Mid RCA lesion is 30% stenosed.  Ost LM to Mid LM lesion is 25% stenosed.  Ost 1st Mrg lesion is 25% stenosed.  Prox LAD lesion is 70% stenosed.   1.  Known severe aortic stenosis with a heavily calcified and restricted aortic valve biplane fluoroscopy, mean gradient by echo 43 mmHg 2.  Essentially normal right heart hemodynamics and preserved cardiac output 3.  Calcified coronary arteries with minor nonobstructive disease involving the left main, left circumflex and RCA 4.  Moderately severe stenosis of the proximal to mid LAD at the origin of the first diagonal branch  Recommendations: Continued multidisciplinary heart team evaluation for consideration of TAVR.  Will review films with colleagues to determine whether to revascularize his LAD territory.  Considering his lack of angina, advanced age, and heavy calcification with noncritical stenosis of the LAD, would favor medical therapy.     Indications   Severe aortic stenosis [I35.0 (ICD-10-CM)]  Procedural Details/Technique   Technical Details INDICATION: Severe aortic stenosis (Pre-TAVR)  PROCEDURAL DETAILS: There was an indwelling IV in a right antecubital vein. Using normal sterile technique, the IV was changed out for a 5 Fr brachial sheath over a 0.018 inch wire. The right wrist was then prepped, draped, and anesthetized with 1% lidocaine. Using the modified Seldinger technique a 5/6 French Slender sheath was placed in the right radial artery. Intra-arterial verapamil was administered through the radial artery sheath. IV heparin was administered after a JR4 catheter was advanced into the central aorta. A Swan-Ganz catheter was used for the right heart catheterization. Standard  protocol was followed for recording of right heart pressures and sampling of oxygen saturations. Fick cardiac output was calculated. Standard Judkins catheters were used for selective coronary angiography. There were no immediate procedural complications. The patient was transferred to the post catheterization recovery area for further monitoring.     Estimated blood loss <50  mL.  During this procedure the patient was administered the following to achieve and maintain moderate conscious sedation: Versed 0.5 mg, Fentanyl 25 mcg, while the patient's heart rate, blood pressure, and oxygen saturation were continuously monitored. The period of conscious sedation was 25 minutes, of which I was present face-to-face 100% of this time.  Coronary Findings   Diagnostic  Dominance: Right  Left Main  Ost LM to Mid LM lesion 25% stenosed  Ost LM to Mid LM lesion is 25% stenosed. There is mild stenosis at the ostium of the left main  Left Anterior Descending  Prox LAD lesion 70% stenosed  Prox LAD lesion is 70% stenosed. The lesion is eccentric. The lesion is moderately calcified. There is a moderate eccentric stenosis of the proximal LAD at the origin of the first diagonal branch, estimated at 60 to 70%.  Left Circumflex  The circumflex is patent with no significant stenosis.  First Obtuse Marginal Branch  Ost 1st Mrg lesion 25% stenosed  Ost 1st Mrg lesion is 25% stenosed.  Right Coronary Artery  The RCA is dominant. The vessel is heavily calcified without significant stenosis.  Ost RCA to Prox RCA lesion 30% stenosed  Ost RCA to Prox RCA lesion is 30% stenosed.  Mid RCA lesion 30% stenosed  Mid RCA lesion is 30% stenosed. The lesion is calcified.  Intervention   No interventions have been documented.  Coronary Diagrams   Diagnostic Diagram       Implants    No implant documentation for this case.  MERGE Images   Show images for CARDIAC CATHETERIZATION   Link to Procedure Log    Procedure Log    Hemo Data    Most Recent Value  Fick Cardiac Output 8.37 L/min  Fick Cardiac Output Index 4.21 (L/min)/BSA  RA A Wave 8 mmHg  RA V Wave 10 mmHg  RA Mean 8 mmHg  RV Systolic Pressure 41 mmHg  RV Diastolic Pressure 1 mmHg  RV EDP 9 mmHg  PA Systolic Pressure 40 mmHg  PA Diastolic Pressure 14 mmHg  PA Mean 22 mmHg  PW A Wave 22 mmHg  PW V Wave 31 mmHg  PW Mean 21 mmHg  AO Systolic Pressure 818 mmHg  AO Diastolic Pressure 48 mmHg  AO Mean 71 mmHg  QP/QS 1  TPVR Index 5.22 HRUI  TSVR Index 16.86 HRUI  PVR SVR Ratio 0.02  TPVR/TSVR Ratio 0.31    Cardiac TAVR CT  TECHNIQUE: The patient was scanned on a [employer]. A 120 kV retrospective scan was triggered in the descending thoracic aorta at 111 HU's. Gantry rotation speed was 250 msecs and collimation was .6 mm. No beta blockade or nitro were given. The 3D data set was reconstructed in 5% intervals of the R-R cycle. Systolic and diastolic phases were analyzed on a dedicated work station using MPR, MIP and VRT modes. The patient received 80 cc of contrast.  FINDINGS: Aortic Valve: Trileaflet aortic valve with severely thickened and calcified leaflets, severely restricted leaflets opening and no calcifications extending into the LVOT.  Aorta: Normal size with moderate to severe diffuse atheroma and calcifications and no dissection.  Sinotubular Junction: 34 x 32 mm  Ascending Thoracic Aorta: 37 x 36 mm  Aortic Arch: 33 x 30 mm  Descending Thoracic Aorta: 28 x 27 mm  Sinus of Valsalva Measurements:  Non-coronary: 39 mm  Right -coronary: 35 mm  Left -coronary: 36 mm  Coronary Artery Height above Annulus:  Left Main: 15  mm  Right Coronary: 19 mm  Virtual Basal Annulus Measurements:  Maximum/Minimum Diameter: 31.5 x 24.2 mm  Mean Diameter: 27.4 mm  Perimeter: 88.7 mm  Area: 588 mm2  Optimum Fluoroscopic Angle for Delivery: LAO 9 CAU  8  IMPRESSION: 1. Trileaflet aortic valve with severely thickened and calcified leaflets, severely restricted leaflets opening and no calcifications extending into the LVOT. Annular measurements suitable for delivery of a 29 mm Edwards-SAPIEN 3 valve.  2. Sufficient coronary to annulus distance.  3. Optimum Fluoroscopic Angle for Delivery:  LAO 9 CAU 8.  4. No thrombus in the left atrial appendage.   Electronically Signed   By: Ena Dawley   On: 08/13/2018 08:23    CT ANGIOGRAPHY CHEST, ABDOMEN AND PELVIS  TECHNIQUE: Multidetector CT imaging through the chest, abdomen and pelvis was performed using the standard protocol during bolus administration of intravenous contrast. Multiplanar reconstructed images and MIPs were obtained and reviewed to evaluate the vascular anatomy.  CONTRAST:  151m ISOVUE-370 IOPAMIDOL (ISOVUE-370) INJECTION 76%  COMPARISON:  No prior chest CT. CT the abdomen and pelvis 02/05/2012.  FINDINGS: CTA CHEST FINDINGS  Cardiovascular: Heart size is enlarged with left atrial dilatation. Filling defects associated with the wall of the left atrial appendage, potentially thrombus versus pseudothrombus. There is no significant pericardial fluid, thickening or pericardial calcification. There is aortic atherosclerosis, as well as atherosclerosis of the great vessels of the mediastinum and the coronary arteries, including calcified atherosclerotic plaque in the left main, left anterior descending, left circumflex and right coronary arteries. Severe thickening calcification of the aortic valve. Calcifications of the mitral annulus.  Mediastinum/Lymph Nodes: No pathologically enlarged mediastinal or hilar lymph nodes. Multiple densely calcified mediastinal and right hilar lymph nodes. Esophagus is unremarkable in appearance. No axillary lymphadenopathy. In the right retrocrural region there is a well-defined 2.3 x 1.8 cm low-attenuation  lesion (axial image 99 of series 7) which is similar to remote prior study 02/05/2012, presumably a benign lesions such as a dilated cisterna chyli or other benign lesion.  Lungs/Pleura: Small right pleural effusion lying dependently. No suspicious appearing pulmonary nodules or masses are noted. No acute consolidative airspace disease. No pleural effusions. Mild diffuse bronchial wall thickening with mild to moderate centrilobular and paraseptal emphysema. Chronic post infectious scarring in the lung apices (right greater than left).  Musculoskeletal/Soft Tissues: There are no aggressive appearing lytic or blastic lesions noted in the visualized portions of the skeleton.  CTA ABDOMEN AND PELVIS FINDINGS  Hepatobiliary: No suspicious cystic or solid hepatic lesions. No intra or extrahepatic biliary ductal dilatation. Gallbladder is normal in appearance.  Pancreas: No pancreatic mass. No pancreatic ductal dilatation. No pancreatic or peripancreatic fluid or inflammatory changes.  Spleen: Unremarkable.  Adrenals/Urinary Tract: Bilateral kidneys and bilateral adrenal glands are normal in appearance. No hydroureteronephrosis. Urinary bladder is normal in appearance.  Stomach/Bowel: Normal appearance of the stomach. No pathologic dilatation of small bowel or colon. Normal appendix.  Vascular/Lymphatic: Aortic atherosclerosis, aortic atherosclerosis with vascular findings and measurements pertinent to potential TAVR procedure, as detailed below. 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 Short segment dissection of the right common iliac artery where there is also mild aneurysmal dilatation (18 mm in diameter). No lymphadenopathy noted in the abdomen or pelvis.  Reproductive: Prostate gland and seminal vesicles are unremarkable in appearance.  Other: Small left inguinal hernia  containing fat and a small volume of fluid. No larger volume of  ascites. No pneumoperitoneum.  Musculoskeletal: There are no aggressive appearing lytic or blastic lesions noted in the visualized portions of the skeleton.  VASCULAR MEASUREMENTS PERTINENT TO TAVR:  AORTA:  Minimal Aortic Diameter-16 x 18 mm  Severity of Aortic Calcification-severe  RIGHT PELVIS:  Right Common Iliac Artery - short segment dissection  Minimal Diameter-10.0 x 9.5 mm  Tortuosity-mild  Calcification-severe  Right External Iliac Artery -  Minimal Diameter-7.0 x 6.7 mm  Tortuosity-severe  Calcification-mild  Right Common Femoral Artery -  Minimal Diameter-9.4 x 8.3 mm  Tortuosity-mild  Calcification-moderate  LEFT PELVIS:  Left Common Iliac Artery - short segment dissection  Minimal Diameter-3.9 x 5.2 mm  Tortuosity-mild  Calcification-severe  Left External Iliac Artery -  Minimal Diameter-7.2 x 7.7 mm  Tortuosity-severe  Calcification-mild  Left Common Femoral Artery -  Minimal Diameter-7.9 x 7.8 mm  Tortuosity-mild  Calcification-moderate  Review of the MIP images confirms the above findings.  IMPRESSION: 1. Vascular findings and measurements pertinent to potential TAVR procedure, as detailed above. 2. Severe thickening calcification of the aortic valve, compatible with the reported clinical history of severe aortic stenosis. 3. Cardiomegaly with left atrial dilatation. Potential filling defects along the wall the left atrial appendage concerning for potential thrombus versus pseudothrombus. Attention at time of forthcoming transesophageal echocardiography is suggested. 4. Small right pleural effusion lying dependently. 5. Aortic atherosclerosis, in addition to left main and 3 vessel coronary artery disease. 6. Mild diffuse bronchial wall thickening with mild to moderate centrilobular and paraseptal emphysema; imaging findings  suggestive of underlying COPD. 7. Additional incidental findings, as above.   Electronically Signed   By: Vinnie Langton M.D.   On: 08/12/2018 12:52   STS Risk Calculator  Procedure: AVR + CAB CALCULATE  Risk of Mortality:  5.521%   Renal Failure:  5.203%   Permanent Stroke:  1.911%   Prolonged Ventilation:  11.288%   DSW Infection:  0.168%   Reoperation:  5.208%   Morbidity or Mortality:  17.710%   Short Length of Stay:  10.503%   Long Length of Stay:  14.720%     Impression:  Patient has stage D severe symptomatic aortic stenosis.  The patient admits that he has slowed down considerably over the last few years.  He gets fatigued easily and he tends to avoid most physical activity.  He has had problems with lower extremity edema.  He denies significant shortness of breath but he essentially avoids anything that makes him tired.  He had a single episode of chest pain last spring that resolved following administration of nitroglycerin.  He has not had recurrent episodes of chest pain since.  I agree with Dr. Antionette Char assessment that the patient probably has at least New York Heart Association functional class II symptoms of chronic diastolic congestive heart failure.    I have personally reviewed the patient's transthoracic echocardiogram performed last April, his diagnostic cardiac catheterization and CT angiograms performed recently.  The patient clearly has severe aortic stenosis.  At the time of his echocardiogram last April left ventricular systolic function remained reasonably well preserved with ejection fraction estimated 50 to 55%.  The aortic valve appeared trileaflet with severe thickening, calcification, and restricted leaflet mobility involving all 3 leaflets.  Peak velocity across aortic valve ranged somewhat because of his irregular heart rhythm but was clearly well above 4 m/s corresponding to mean transvalvular gradient estimated 43 mmHg.  Diagnostic cardiac  catheterization revealed discrete 70% stenosis of the mid left anterior descending coronary artery, but  the patient denied any symptoms to suggest angina pectoris.  Cardiac-gated CTA of the heart reveals anatomical characteristics consistent with aortic stenosis suitable for treatment by transcatheter aortic valve replacement without any significant complicating features.  CTA of the aorta and iliac vessels demonstrate what appears to be adequate pelvic vascular access to facilitate a transfemoral approach, although the patient has moderate atherosclerotic disease with significant tortuosity as well as short segment dissections in both iliac vessels which might potentially increased risks of a significant vascular complication.  I agree the patient might benefit from aortic valve replacement.  However, the functional improvement the patient might enjoy will depend upon whether or not he makes any effort to become more active.  I suspect the patient's severe aortic stenosis is contributing substantially to his functional decline, but at his age it is unclear whether or not treatment of his aortic stenosis will dramatically improve his current quality of life.  Moreover, because of the patient's poor functional status and extremely advanced age I would not consider this elderly gentleman a candidate for conventional surgical aortic valve replacement under any circumstances.  Reasonable options include long-term palliative medical therapy versus transcatheter aortic valve replacement.   Plan:  The patient and his daughter counseled at length regarding treatment alternatives for management of severe symptomatic aortic stenosis. Alternative approaches such as conventional aortic valve replacement, transcatheter aortic valve replacement, and continued medical therapy without intervention were compared and contrasted at length.  The risks associated with conventional surgical aortic valve replacement were discussed  in detail, as were expectations for post-operative convalescence, and why I would not consider this patient a candidate for conventional surgery under any circumstances.  Issues specific to transcatheter aortic valve replacement were discussed including questions about long term valve durability, the potential for paravalvular leak, possible increased risk of need for permanent pacemaker placement, and other technical complications related to the procedure itself.  Long-term prognosis with medical therapy was discussed. This discussion was placed in the context of the patient's own specific clinical presentation and past medical history.  All of their questions have been addressed.  The patient desires an aggressive approach to his care and hopes to proceed with transcatheter aortic valve replacement in the near future.  We tentatively plan for surgery on September 14, 2018.    Following the decision to proceed with transcatheter aortic valve replacement, a discussion has been held regarding what types of management strategies would be attempted intraoperatively in the event of life-threatening complications, including the fact that the patient would be considered a candidate for the use of cardiopulmonary bypass and/or conversion to open sternotomy for attempted surgical intervention.  The patient has been advised of a variety of complications that might develop including but not limited to risks of death, stroke, paravalvular leak, aortic dissection or other major vascular complications, aortic annulus rupture, device embolization, cardiac rupture or perforation, mitral regurgitation, acute myocardial infarction, arrhythmia, heart block or bradycardia requiring permanent pacemaker placement, congestive heart failure, respiratory failure, renal failure, pneumonia, infection, other late complications related to structural valve deterioration or migration, or other complications that might ultimately cause a  temporary or permanent loss of functional independence or other long term morbidity.  The patient provides full informed consent for the procedure as described and all questions were answered.  The patient has been instructed to stop taking warfarin 7 days prior to surgery.   I spent in excess of 90 minutes during the conduct of this office consultation and >50% of this  time involved direct face-to-face encounter with the patient for counseling and/or coordination of their care.     Valentina Gu. Roxy Manns, MD 08/23/2018 10:59 AM

## 2018-08-23 NOTE — Telephone Encounter (Signed)
Spoke with the pts home health Nurse [name] [name], and endorsed to her that Dr Meda Coffee also recommends that we increase the pts lasix to 40 mg po bid, take 1st dose at around 0800 and 2nd dose at around 2 pm everyday.  [name] Dubin RN that he should utilize his compression stockings, maintain low sodium diet, and elevate his extremities at rest. Per [name] Pina RN she verbalized understanding and agrees with this plan.  [name] [name] will call the pts family with this update.

## 2018-08-23 NOTE — H&P (View-Only) (Signed)
[facility]GERY CONSULTATION REPORT  Referring Provider is Dorothy Spark, MD PCP is [name], NP  Chief Complaint  Patient presents with  . 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  Patient's cardiac history dates back to the early 1990s when he reportedly suffered an acute myocardial infarction.  He reportedly underwent cardiac catheterization at that time and was found to have normal coronary arteries.  He developed persistent atrial fibrillation and has remained on long-term warfarin anticoagulation ever since.  He was followed for many years by Dr. Velora Heckler and more recently has been followed by Dr. Meda Coffee.  He has had problems with chronic lower extremity edema.  Echocardiograms have demonstrated the presence of low normal left ventricular systolic function with aortic stenosis that has gradually progressed in severity.  Echocardiogram performed March 02, 2018 revealed severe aortic stenosis.  Peak velocity across aortic valve measured 4.4 m/s corresponding to mean transvalvular gradient estimated 43 mmHg.  The DVI was reportedly only 0.18.  Left ventricular systolic function remains stable with ejection fraction estimated 50 to 55%.  In May 2019 he was evaluated in the emergency  department with a brief episode of chest pain relieved by nitroglycerin.  Troponins were negative.  He was seen in follow-up by Truitt Merle in July and subsequently the patient was referred to the multidisciplinary heart valve clinic for evaluation of his aortic stenosis.  He was seen in consultation by Dr. Burt Knack on July 15, 2018 and diagnostic cardiac catheterization was performed July 21, 2018.  Catheterization revealed coronary artery disease with 70% proximal stenosis of the left anterior descending coronary artery and otherwise moderate nonobstructive disease.  Right heart pressures and cardiac output were normal.  CT angiography was performed and the patient has been referred for surgical consultation.  The patient is married but lives alone in his home locally in [place].  His wife suffers from Alzheimer's type dementia and is currently in a memory unit in a local skilled nursing facility.  Patient has 2 adult children including one daughter who accompanies him for his office visit today.  The patient admits that he is very sedentary.  His activity level has diminished considerably over the last few years.  He hardly ever gets out of the house although recently a physical therapist has been taking him for short walks out of doors.  He is unsteady on his feet and now uses a rolling walker to steady his balance.  He denies any symptoms of exertional shortness of breath or chest discomfort.  He admits that he is chronically fatigued.  His daughter states that he frequently falls asleep during the daytime.  His appetite is stable.  He has not had syncope.  He has occasional mild dizziness when he stands up.  He has chronic swelling in both lower legs.  He denies PND but he does report feeling short of breath if he lays flat in bed.  Past Medical History:  Diagnosis Date  . Atrial fibrillation, chronic (HCC)    a. on coumadin   . Carotid artery occlusion    right total internal artery occlusion    . CHF (congestive heart failure) ([place])   . Coronary artery disease    s/p cardiac cath in 1990s, and cardiolite study  in 2005 showing EF 54%  . Degenerative disc disease    cervical spine  . GERD (gastroesophageal reflux disease)   . Hyperlipidemia   . Hypertension   . Hypothyroidism   . Rheumatoid arthritis ([place])   . Skin cancer    forehead  . TIA (transient ischemic attack)   . Urgency incontinence     Past Surgical History:  Procedure Laterality Date  . 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 CARDIAC CATHETERIZATION  1990s  . CATARACT EXTRACTION W/ INTRAOCULAR LENS  IMPLANT, BILATERAL    . EYE SURGERY    . RIGHT/LEFT HEART CATH AND CORONARY ANGIOGRAPHY N/A 07/21/2018   Procedure: RIGHT/LEFT HEART CATH AND CORONARY ANGIOGRAPHY;  Surgeon: Sherren Mocha, MD;  Location: [facility];  Service: Cardiovascular;  Laterality: N/A;  . SKIN CANCER EXCISION  12/2011   forehead    Family History  Problem Relation Age of Onset  . Heart disease Mother   . Malignant hyperthermia Mother   . Hypertension Mother   . Heart disease Father   . Malignant hyperthermia Father   . Hyperlipidemia Father   . Hypertension Father   . Heart disease Sister   . Hypertension Sister   . Heart disease Brother     Social History   Socioeconomic History  . Marital status: Married    Spouse name: Not on file  . Number of children: Not on file  . Years of education: Not on file  . Highest education level: Not on file  Occupational History  . Not on file  Social Needs  . Financial resource strain: Not on file  . Food insecurity:    Worry: Not on file    Inability: Not on file  . Transportation needs:    Medical: Not on file    Non-medical: Not on file  Tobacco Use  . Smoking status: Former Smoker    Packs/day: 1.00    Years:  40.00    Pack years: 40.00    Types: Cigarettes    Last attempt to quit: 09/12/1989    Years since quitting: 28.9  . Smokeless tobacco: Never Used  . Tobacco comment: quit around 1992  Substance and Sexual Activity  . Alcohol use: No    Alcohol/week: 0.0 standard drinks    Comment: former, quit drinking in 1992  . Drug use: No  . Sexual activity: Not Currently  Lifestyle  . Physical activity:    Days per week: Not on file    Minutes per session: Not on file  . Stress: Not on file  Relationships  . Social connections:    Talks on phone: Not on file    Gets together: Not on file    Attends religious service: Not on file    Active member of club or organization: Not on file    Attends meetings of clubs or organizations: Not on file    Relationship status: Not on file  . Intimate partner violence:    Fear of current  or ex partner: Not on file    Emotionally abused: Not on file    Physically abused: Not on file    Forced sexual activity: Not on file  Other Topics Concern  . 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 aspirin 81 MG tablet Take 81 mg by mouth at bedtime.     Marland Kitchen atorvastatin (LIPITOR) 10 MG tablet TAKE 1 TABLET AT BEDTIME 90 tablet 3  . Calcium-Vitamin D (CALTRATE 600 PLUS-VIT D PO) Take 1 tablet by mouth at bedtime.     . folic acid (FOLVITE) 1 MG tablet Take 1 mg by mouth daily.     . furosemide (LASIX) 40 MG tablet Take 1 tablet (40 mg total) by mouth 2 (two) times daily. 180 tablet 0  . methotrexate 2.5 MG tablet Take 10 mg by mouth once a week.    . metoprolol succinate (TOPROL-XL) 25 MG 24 hr tablet Take 0.5 tablets (12.5 mg total) by mouth daily. Take with or immediately following a meal. 45 tablet 3  . nitroGLYCERIN (NITROSTAT) 0.4 MG SL tablet  Place 1 tablet (0.4 mg total) under the tongue every 5 (five) minutes as needed for chest pain. 25 tablet 0  . Probiotic Product (RA PROBIOTIC COLON CARE PO) Take 1 capsule by mouth daily.    . tamsulosin (FLOMAX) 0.4 MG CAPS capsule Take 0.4 mg by mouth every evening.    . warfarin (COUMADIN) 5 MG tablet Take 1 tablet daily except 1 1/2 Mon Wed Fri or TAKE AS DIRECTED BY ANTICOAGULATION CLINIC (Patient taking differently: Take 5-7.5 mg by mouth See admin instructions. Take 1 tablet daily except 1 1/2 Mon Wed Fri or TAKE AS DIRECTED BY ANTICOAGULATION CLINIC) 120 tablet 1  . metolazone (ZAROXOLYN) 2.5 MG tablet Take 1 tablet (2.5 mg total) by mouth 3 (three) times a week. Take on Tuesday, Thursday, Saturday. 36 tablet 3   No current facility-administered medications for this visit.     Allergies  Allergen Reactions  . Levofloxacin Rash    "thought they were sticking swords thru me"  . Klor-Con [Potassium Chloride Er] Diarrhea    DIARRHEA      Review of Systems:   General:  normal appetite, decreased energy, no weight gain, no weight loss, no fever  Cardiac:  no chest pain with exertion, no chest pain at rest, no SOB with exertion, no resting SOB, no PND, + orthopnea, no palpitations, + arrhythmia, + atrial fibrillation, + LE edema, + dizzy spells, no syncope  Respiratory:  no shortness of breath, no home oxygen, no productive cough, no dry cough, no bronchitis, no wheezing, no hemoptysis, no asthma, no pain with inspiration or cough, no sleep apnea, no CPAP at night  GI:   no difficulty swallowing, no reflux, no frequent heartburn, no hiatal hernia, no abdominal pain, no constipation, + diarrhea, no hematochezia, no hematemesis, no melena  GU:   no dysuria,  + frequency, no urinary tract infection, no hematuria, no enlarged prostate, no kidney stones, no kidney disease  Vascular:  no pain suggestive of claudication, no pain in feet, no leg cramps, no varicose veins, no DVT, no non-healing  foot ulcer  Neuro:   no stroke, + TIA's, no seizures, no headaches, no  temporary blindness one eye,  no slurred speech, no peripheral neuropathy, no chronic pain, + instability of gait, + memory/cognitive dysfunction  Musculoskeletal: no arthritis, no joint swelling, no myalgias, + difficulty walking, limited mobility   Skin:   no rash, no itching, no skin infections, no pressure sores or ulcerations  Psych:   no anxiety, no depression, + nervousness, no unusual recent stress  Eyes:   no blurry vision, no floaters, no recent vision changes, + wears glasses or contacts  ENT:   no hearing loss, no loose or painful teeth, + dentures, last saw dentist 6 years ago  Hematologic:  no easy bruising, no abnormal bleeding, no clotting disorder, no frequent epistaxis  Endocrine:  no diabetes, does not check CBG's at home           Physical Exam:   BP 100/60 (BP Location: Left Arm, Patient Position: Sitting, Cuff Size: Normal)   Pulse 82   Resp 18   Ht 5' 11.5" (1.816 m)   Wt 179 lb (81.2 kg)   SpO2 96% Comment: RA  BMI 24.62 kg/m   General:  Elderly, frail-appearing  HEENT:  Unremarkable   Neck:   no JVD, no bruits, no adenopathy   Chest:   clear to auscultation, symmetrical breath sounds, no wheezes, no rhonchi   CV:   Irregular rate and rhythm, grade III/VI crescendo/decrescendo murmur heard best at LSB,  no diastolic murmur  Abdomen:  soft, non-tender, no masses   Extremities:  warm, well-perfused, pulses diminished but palpable, + bilateral LE edema  Rectal/GU  Deferred  Neuro:   Grossly non-focal and symmetrical throughout  Skin:   Clean and dry, no rashes, no breakdown   Diagnostic Tests:  Echocardiography  (Report amended )  Patient:    Zae, Kirtz MR #:       [id number] Study Date: 03/02/2018 Gender:     M Age:        89 Height:     181 cm Weight:     85.3 kg BSA:        2.08 m^2 Pt. Status: Room:   SONOGRAPHER  [place], Will  ATTENDING    Sanda Klein,  MD  Livia Snellen     Ena Dawley, M.D.  REFERRING    Ena Dawley, M.D.  PERFORMING   [facility], Outpatient  cc:  ------------------------------------------------------------------- LV EF: 50% -   55%  ------------------------------------------------------------------- Indications:      (I50.43).  ------------------------------------------------------------------- History:   PMH:  Acquired from the patient and from the patient&'s chart.  Atrial fibrillation.  Congestive heart failure.  Aortic stenosis.  Transient ischemic attack.  PMH:   Myocardial infarction.  Risk factors:  Former tobacco use. Hypertension. Dyslipidemia.  ------------------------------------------------------------------- Study Conclusions  - Left ventricle: The cavity size was normal. Wall thickness was   normal. Systolic function was at the lower limits of normal. The   estimated ejection fraction was in the range of 50% to 55%. Wall   motion was normal; there were no regional wall motion   abnormalities. - Aortic valve: There was severe stenosis. There was mild   regurgitation. - Mitral valve: Calcified annulus. There was mild regurgitation   directed centrally. - Left atrium: The atrium was moderately dilated. - Right atrium: The atrium was moderately dilated. - Tricuspid valve: There was moderate regurgitation.  Impressions:  - Aortic stenosis has worsened since the October 2018 study.  ------------------------------------------------------------------- Study data:  Comparison was made to the study of 09/22/2017.  Study status:  Routine.  Procedure:  The patient reported no pain pre or post test. Transthoracic echocardiography for left ventricular function evaluation. Image quality was adequate.  Study completion:  There were no complications.          Echocardiography.  M-mode, complete 2D, spectral Doppler, and color Doppler.  Birthdate: Patient birthdate: Sep 10, 1925.  Age:  Patient is  84 yr old.  Sex: Gender: male.    BMI: 26 kg/m^2.  Blood pressure:     132/74 Patient status:  Outpatient.  Study date:  Study date: 03/02/2018. Study time: 03:23 PM.  Location:  [facility]  -------------------------------------------------------------------  ------------------------------------------------------------------- Left ventricle:  The cavity size was normal. Wall thickness was normal. Systolic function was at the lower limits of normal. The estimated ejection fraction was in the range of 50% to 55%. Wall motion was normal; there were no regional wall motion abnormalities. The study was not technically sufficient to allow evaluation of LV diastolic dysfunction due to atrial fibrillation.   ------------------------------------------------------------------- Aortic valve:   Trileaflet; severely thickened, moderately calcified leaflets.  Doppler:   There was severe stenosis.   The ejection jet is still early peaking. There was mild regurgitation.   VTI ratio of LVOT to aortic valve: 0.17. Valve area (VTI): 0.7 cm^2. Indexed valve area (VTI): 0.34 cm^2/m^2. Peak velocity ratio of LVOT to aortic valve: 0.19. Valve area (Vmax): 0.8 cm^2. Indexed valve area (Vmax): 0.38 cm^2/m^2. Mean velocity ratio of LVOT to aortic valve: 0.18. Valve area (Vmean): 0.73 cm^2. Indexed valve area (Vmean): 0.35 cm^2/m^2.    Mean gradient (S): 43 mm Hg. Peak gradient (S): 78 mm Hg.  ------------------------------------------------------------------- Aorta:  Aortic root: The aortic root was normal in size. Ascending aorta: The ascending aorta was mildly dilated.  ------------------------------------------------------------------- Mitral valve:   Calcified annulus. Leaflet separation was normal. Doppler:  Transvalvular velocity was within the normal range. There was no evidence for stenosis. There was mild regurgitation directed centrally.    Peak gradient (D): 6 mm  Hg.  ------------------------------------------------------------------- Left atrium:  The atrium was moderately dilated.  ------------------------------------------------------------------- Right ventricle:  The cavity size was normal. Systolic function was normal.  ------------------------------------------------------------------- Pulmonic valve:   Poorly visualized.  The valve appears to be grossly normal.   Cusp separation was normal.  Doppler: Transvalvular velocity was within the normal range. There was no regurgitation.  ------------------------------------------------------------------- Tricuspid valve:   Structurally normal valve.   Leaflet separation was normal.  Doppler:  Transvalvular velocity was within the normal range. There was moderate regurgitation.  ------------------------------------------------------------------- Pulmonary artery:   Systolic pressure was within the normal range.   ------------------------------------------------------------------- Right atrium:  The atrium was moderately dilated.  ------------------------------------------------------------------- Pericardium:  There was no pericardial effusion.  ------------------------------------------------------------------- Systemic veins: Inferior vena cava: The vessel was normal in size. The respirophasic diameter changes were in the normal range (= 50%), consistent with normal central venous pressure.  ------------------------------------------------------------------- Measurements   Left ventricle                            Value          Reference  LV ID, ED, PLAX chordal                   47.5  mm       43 - 52  LV ID, ES, PLAX chordal  33.9  mm       23 - 38  LV fx shortening, PLAX chordal            29    %        >=29  LV PW thickness, ED                       15.3  mm       ---------  IVS/LV PW ratio, ED                       0.8            <=1.3  Stroke  volume, 2D                         76    ml       ---------  Stroke volume/bsa, 2D                     37    ml/m^2   ---------    Ventricular septum                        Value          Reference  IVS thickness, ED                         12.3  mm       ---------    LVOT                                      Value          Reference  LVOT ID, S                                23    mm       ---------  LVOT area                                 4.15  cm^2     ---------  LVOT ID                                   23    mm       ---------  LVOT peak velocity, S                     85    cm/s     ---------  LVOT mean velocity, S                     53.6  cm/s     ---------  LVOT VTI, S                               18.4  cm       ---------  LVOT peak gradient, S                     3  mm Hg    ---------  Stroke volume (SV), LVOT DP               76.4  ml       ---------  Stroke index (SV/bsa), LVOT DP            36.7  ml/m^2   ---------    Aortic valve                              Value          Reference  Aortic valve peak velocity, S             443   cm/s     ---------  Aortic valve mean velocity, S             306   cm/s     ---------  Aortic valve VTI, S                       109   cm       ---------  Aortic mean gradient, S                   43    mm Hg    ---------  Aortic peak gradient, S                   78    mm Hg    ---------  VTI ratio, LVOT/AV                        0.17           ---------  Aortic valve area, VTI                    0.7   cm^2     ---------  Aortic valve area/bsa, VTI                0.34  cm^2/m^2 ---------  Velocity ratio, peak, LVOT/AV             0.19           ---------  Aortic valve area, peak velocity          0.8   cm^2     ---------  Aortic valve area/bsa, peak               0.38  cm^2/m^2 ---------  velocity  Velocity ratio, mean, LVOT/AV             0.18           ---------  Aortic valve area, mean velocity          0.73  cm^2     ---------   Aortic valve area/bsa, mean               0.35  cm^2/m^2 ---------  velocity  Aortic regurg pressure half-time          535   ms       ---------    Aorta                                     Value          Reference  Aortic root ID, ED  40    mm       ---------  Ascending aorta ID, A-P, S                40    mm       ---------    Left atrium                               Value          Reference  LA ID, A-P, ES                            46    mm       ---------  LA ID/bsa, A-P                    (H)     2.21  cm/m^2   <=2.2  LA volume, S                              114   ml       ---------  LA volume/bsa, S                          54.8  ml/m^2   ---------  LA volume, ES, 1-p A4C                    108   ml       ---------  LA volume/bsa, ES, 1-p A4C                51.9  ml/m^2   ---------  LA volume, ES, 1-p A2C                    120   ml       ---------  LA volume/bsa, ES, 1-p A2C                57.7  ml/m^2   ---------    Mitral valve                              Value          Reference  Mitral E-wave peak velocity               121   cm/s     ---------  Mitral deceleration time                  222   ms       150 - 230  Mitral peak gradient, D                   6     mm Hg    ---------    Pulmonary arteries                        Value          Reference  PA pressure, S, DP                        27    mm Hg    <=30    Tricuspid valve  Value          Reference  Tricuspid regurg peak velocity            247   cm/s     ---------  Tricuspid peak RV-RA gradient             24    mm Hg    ---------    Systemic veins                            Value          Reference  Estimated CVP                             3     mm Hg    ---------    Right ventricle                           Value          Reference  RV pressure, S, DP                        27    mm Hg    <=30  Legend: (L)  and  (H)  mark values outside specified reference  range.  ------------------------------------------------------------------- Renaldo Harrison, MD 2019-04-09T18:08:58    RIGHT/LEFT HEART CATH AND CORONARY ANGIOGRAPHY  Conclusion     Ost RCA to Prox RCA lesion is 30% stenosed.  Mid RCA lesion is 30% stenosed.  Ost LM to Mid LM lesion is 25% stenosed.  Ost 1st Mrg lesion is 25% stenosed.  Prox LAD lesion is 70% stenosed.   1.  Known severe aortic stenosis with a heavily calcified and restricted aortic valve biplane fluoroscopy, mean gradient by echo 43 mmHg 2.  Essentially normal right heart hemodynamics and preserved cardiac output 3.  Calcified coronary arteries with minor nonobstructive disease involving the left main, left circumflex and RCA 4.  Moderately severe stenosis of the proximal to mid LAD at the origin of the first diagonal branch  Recommendations: Continued multidisciplinary heart team evaluation for consideration of TAVR.  Will review films with colleagues to determine whether to revascularize his LAD territory.  Considering his lack of angina, advanced age, and heavy calcification with noncritical stenosis of the LAD, would favor medical therapy.     Indications   Severe aortic stenosis [I35.0 (ICD-10-CM)]  Procedural Details/Technique   Technical Details INDICATION: Severe aortic stenosis (Pre-TAVR)  PROCEDURAL DETAILS: There was an indwelling IV in a right antecubital vein. Using normal sterile technique, the IV was changed out for a 5 Fr brachial sheath over a 0.018 inch wire. The right wrist was then prepped, draped, and anesthetized with 1% lidocaine. Using the modified Seldinger technique a 5/6 French Slender sheath was placed in the right radial artery. Intra-arterial verapamil was administered through the radial artery sheath. IV heparin was administered after a JR4 catheter was advanced into the central aorta. A Swan-Ganz catheter was used for the right heart catheterization. Standard  protocol was followed for recording of right heart pressures and sampling of oxygen saturations. Fick cardiac output was calculated. Standard Judkins catheters were used for selective coronary angiography. There were no immediate procedural complications. The patient was transferred to the post catheterization recovery area for further monitoring.     Estimated blood loss <50  mL.  During this procedure the patient was administered the following to achieve and maintain moderate conscious sedation: Versed 0.5 mg, Fentanyl 25 mcg, while the patient's heart rate, blood pressure, and oxygen saturation were continuously monitored. The period of conscious sedation was 25 minutes, of which I was present face-to-face 100% of this time.  Coronary Findings   Diagnostic  Dominance: Right  Left Main  Ost LM to Mid LM lesion 25% stenosed  Ost LM to Mid LM lesion is 25% stenosed. There is mild stenosis at the ostium of the left main  Left Anterior Descending  Prox LAD lesion 70% stenosed  Prox LAD lesion is 70% stenosed. The lesion is eccentric. The lesion is moderately calcified. There is a moderate eccentric stenosis of the proximal LAD at the origin of the first diagonal branch, estimated at 60 to 70%.  Left Circumflex  The circumflex is patent with no significant stenosis.  First Obtuse Marginal Branch  Ost 1st Mrg lesion 25% stenosed  Ost 1st Mrg lesion is 25% stenosed.  Right Coronary Artery  The RCA is dominant. The vessel is heavily calcified without significant stenosis.  Ost RCA to Prox RCA lesion 30% stenosed  Ost RCA to Prox RCA lesion is 30% stenosed.  Mid RCA lesion 30% stenosed  Mid RCA lesion is 30% stenosed. The lesion is calcified.  Intervention   No interventions have been documented.  Coronary Diagrams   Diagnostic Diagram       Implants    No implant documentation for this case.  MERGE Images   Show images for CARDIAC CATHETERIZATION   Link to Procedure Log    Procedure Log    Hemo Data    Most Recent Value  Fick Cardiac Output 8.37 L/min  Fick Cardiac Output Index 4.21 (L/min)/BSA  RA A Wave 8 mmHg  RA V Wave 10 mmHg  RA Mean 8 mmHg  RV Systolic Pressure 41 mmHg  RV Diastolic Pressure 1 mmHg  RV EDP 9 mmHg  PA Systolic Pressure 40 mmHg  PA Diastolic Pressure 14 mmHg  PA Mean 22 mmHg  PW A Wave 22 mmHg  PW V Wave 31 mmHg  PW Mean 21 mmHg  AO Systolic Pressure 818 mmHg  AO Diastolic Pressure 48 mmHg  AO Mean 71 mmHg  QP/QS 1  TPVR Index 5.22 HRUI  TSVR Index 16.86 HRUI  PVR SVR Ratio 0.02  TPVR/TSVR Ratio 0.31    Cardiac TAVR CT  TECHNIQUE: The patient was scanned on a [employer]. A 120 kV retrospective scan was triggered in the descending thoracic aorta at 111 HU's. Gantry rotation speed was 250 msecs and collimation was .6 mm. No beta blockade or nitro were given. The 3D data set was reconstructed in 5% intervals of the R-R cycle. Systolic and diastolic phases were analyzed on a dedicated work station using MPR, MIP and VRT modes. The patient received 80 cc of contrast.  FINDINGS: Aortic Valve: Trileaflet aortic valve with severely thickened and calcified leaflets, severely restricted leaflets opening and no calcifications extending into the LVOT.  Aorta: Normal size with moderate to severe diffuse atheroma and calcifications and no dissection.  Sinotubular Junction: 34 x 32 mm  Ascending Thoracic Aorta: 37 x 36 mm  Aortic Arch: 33 x 30 mm  Descending Thoracic Aorta: 28 x 27 mm  Sinus of Valsalva Measurements:  Non-coronary: 39 mm  Right -coronary: 35 mm  Left -coronary: 36 mm  Coronary Artery Height above Annulus:  Left Main: 15  mm  Right Coronary: 19 mm  Virtual Basal Annulus Measurements:  Maximum/Minimum Diameter: 31.5 x 24.2 mm  Mean Diameter: 27.4 mm  Perimeter: 88.7 mm  Area: 588 mm2  Optimum Fluoroscopic Angle for Delivery: LAO 9 CAU  8  IMPRESSION: 1. Trileaflet aortic valve with severely thickened and calcified leaflets, severely restricted leaflets opening and no calcifications extending into the LVOT. Annular measurements suitable for delivery of a 29 mm Edwards-SAPIEN 3 valve.  2. Sufficient coronary to annulus distance.  3. Optimum Fluoroscopic Angle for Delivery:  LAO 9 CAU 8.  4. No thrombus in the left atrial appendage.   Electronically Signed   By: Ena Dawley   On: 08/13/2018 08:23    CT ANGIOGRAPHY CHEST, ABDOMEN AND PELVIS  TECHNIQUE: Multidetector CT imaging through the chest, abdomen and pelvis was performed using the standard protocol during bolus administration of intravenous contrast. Multiplanar reconstructed images and MIPs were obtained and reviewed to evaluate the vascular anatomy.  CONTRAST:  151m ISOVUE-370 IOPAMIDOL (ISOVUE-370) INJECTION 76%  COMPARISON:  No prior chest CT. CT the abdomen and pelvis 02/05/2012.  FINDINGS: CTA CHEST FINDINGS  Cardiovascular: Heart size is enlarged with left atrial dilatation. Filling defects associated with the wall of the left atrial appendage, potentially thrombus versus pseudothrombus. There is no significant pericardial fluid, thickening or pericardial calcification. There is aortic atherosclerosis, as well as atherosclerosis of the great vessels of the mediastinum and the coronary arteries, including calcified atherosclerotic plaque in the left main, left anterior descending, left circumflex and right coronary arteries. Severe thickening calcification of the aortic valve. Calcifications of the mitral annulus.  Mediastinum/Lymph Nodes: No pathologically enlarged mediastinal or hilar lymph nodes. Multiple densely calcified mediastinal and right hilar lymph nodes. Esophagus is unremarkable in appearance. No axillary lymphadenopathy. In the right retrocrural region there is a well-defined 2.3 x 1.8 cm low-attenuation  lesion (axial image 99 of series 7) which is similar to remote prior study 02/05/2012, presumably a benign lesions such as a dilated cisterna chyli or other benign lesion.  Lungs/Pleura: Small right pleural effusion lying dependently. No suspicious appearing pulmonary nodules or masses are noted. No acute consolidative airspace disease. No pleural effusions. Mild diffuse bronchial wall thickening with mild to moderate centrilobular and paraseptal emphysema. Chronic post infectious scarring in the lung apices (right greater than left).  Musculoskeletal/Soft Tissues: There are no aggressive appearing lytic or blastic lesions noted in the visualized portions of the skeleton.  CTA ABDOMEN AND PELVIS FINDINGS  Hepatobiliary: No suspicious cystic or solid hepatic lesions. No intra or extrahepatic biliary ductal dilatation. Gallbladder is normal in appearance.  Pancreas: No pancreatic mass. No pancreatic ductal dilatation. No pancreatic or peripancreatic fluid or inflammatory changes.  Spleen: Unremarkable.  Adrenals/Urinary Tract: Bilateral kidneys and bilateral adrenal glands are normal in appearance. No hydroureteronephrosis. Urinary bladder is normal in appearance.  Stomach/Bowel: Normal appearance of the stomach. No pathologic dilatation of small bowel or colon. Normal appendix.  Vascular/Lymphatic: Aortic atherosclerosis, aortic atherosclerosis with vascular findings and measurements pertinent to potential TAVR procedure, as detailed below. 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 Short segment dissection of the right common iliac artery where there is also mild aneurysmal dilatation (18 mm in diameter). No lymphadenopathy noted in the abdomen or pelvis.  Reproductive: Prostate gland and seminal vesicles are unremarkable in appearance.  Other: Small left inguinal hernia  containing fat and a small volume of fluid. No larger volume of  ascites. No pneumoperitoneum.  Musculoskeletal: There are no aggressive appearing lytic or blastic lesions noted in the visualized portions of the skeleton.  VASCULAR MEASUREMENTS PERTINENT TO TAVR:  AORTA:  Minimal Aortic Diameter-16 x 18 mm  Severity of Aortic Calcification-severe  RIGHT PELVIS:  Right Common Iliac Artery - short segment dissection  Minimal Diameter-10.0 x 9.5 mm  Tortuosity-mild  Calcification-severe  Right External Iliac Artery -  Minimal Diameter-7.0 x 6.7 mm  Tortuosity-severe  Calcification-mild  Right Common Femoral Artery -  Minimal Diameter-9.4 x 8.3 mm  Tortuosity-mild  Calcification-moderate  LEFT PELVIS:  Left Common Iliac Artery - short segment dissection  Minimal Diameter-3.9 x 5.2 mm  Tortuosity-mild  Calcification-severe  Left External Iliac Artery -  Minimal Diameter-7.2 x 7.7 mm  Tortuosity-severe  Calcification-mild  Left Common Femoral Artery -  Minimal Diameter-7.9 x 7.8 mm  Tortuosity-mild  Calcification-moderate  Review of the MIP images confirms the above findings.  IMPRESSION: 1. Vascular findings and measurements pertinent to potential TAVR procedure, as detailed above. 2. Severe thickening calcification of the aortic valve, compatible with the reported clinical history of severe aortic stenosis. 3. Cardiomegaly with left atrial dilatation. Potential filling defects along the wall the left atrial appendage concerning for potential thrombus versus pseudothrombus. Attention at time of forthcoming transesophageal echocardiography is suggested. 4. Small right pleural effusion lying dependently. 5. Aortic atherosclerosis, in addition to left main and 3 vessel coronary artery disease. 6. Mild diffuse bronchial wall thickening with mild to moderate centrilobular and paraseptal emphysema; imaging findings  suggestive of underlying COPD. 7. Additional incidental findings, as above.   Electronically Signed   By: Vinnie Langton M.D.   On: 08/12/2018 12:52   STS Risk Calculator  Procedure: AVR + CAB CALCULATE  Risk of Mortality:  5.521%   Renal Failure:  5.203%   Permanent Stroke:  1.911%   Prolonged Ventilation:  11.288%   DSW Infection:  0.168%   Reoperation:  5.208%   Morbidity or Mortality:  17.710%   Short Length of Stay:  10.503%   Long Length of Stay:  14.720%     Impression:  Patient has stage D severe symptomatic aortic stenosis.  The patient admits that he has slowed down considerably over the last few years.  He gets fatigued easily and he tends to avoid most physical activity.  He has had problems with lower extremity edema.  He denies significant shortness of breath but he essentially avoids anything that makes him tired.  He had a single episode of chest pain last spring that resolved following administration of nitroglycerin.  He has not had recurrent episodes of chest pain since.  I agree with Dr. Antionette Char assessment that the patient probably has at least New York Heart Association functional class II symptoms of chronic diastolic congestive heart failure.    I have personally reviewed the patient's transthoracic echocardiogram performed last April, his diagnostic cardiac catheterization and CT angiograms performed recently.  The patient clearly has severe aortic stenosis.  At the time of his echocardiogram last April left ventricular systolic function remained reasonably well preserved with ejection fraction estimated 50 to 55%.  The aortic valve appeared trileaflet with severe thickening, calcification, and restricted leaflet mobility involving all 3 leaflets.  Peak velocity across aortic valve ranged somewhat because of his irregular heart rhythm but was clearly well above 4 m/s corresponding to mean transvalvular gradient estimated 43 mmHg.  Diagnostic cardiac  catheterization revealed discrete 70% stenosis of the mid left anterior descending coronary artery, but  the patient denied any symptoms to suggest angina pectoris.  Cardiac-gated CTA of the heart reveals anatomical characteristics consistent with aortic stenosis suitable for treatment by transcatheter aortic valve replacement without any significant complicating features.  CTA of the aorta and iliac vessels demonstrate what appears to be adequate pelvic vascular access to facilitate a transfemoral approach, although the patient has moderate atherosclerotic disease with significant tortuosity as well as short segment dissections in both iliac vessels which might potentially increased risks of a significant vascular complication.  I agree the patient might benefit from aortic valve replacement.  However, the functional improvement the patient might enjoy will depend upon whether or not he makes any effort to become more active.  I suspect the patient's severe aortic stenosis is contributing substantially to his functional decline, but at his age it is unclear whether or not treatment of his aortic stenosis will dramatically improve his current quality of life.  Moreover, because of the patient's poor functional status and extremely advanced age I would not consider this elderly gentleman a candidate for conventional surgical aortic valve replacement under any circumstances.  Reasonable options include long-term palliative medical therapy versus transcatheter aortic valve replacement.   Plan:  The patient and his daughter counseled at length regarding treatment alternatives for management of severe symptomatic aortic stenosis. Alternative approaches such as conventional aortic valve replacement, transcatheter aortic valve replacement, and continued medical therapy without intervention were compared and contrasted at length.  The risks associated with conventional surgical aortic valve replacement were discussed  in detail, as were expectations for post-operative convalescence, and why I would not consider this patient a candidate for conventional surgery under any circumstances.  Issues specific to transcatheter aortic valve replacement were discussed including questions about long term valve durability, the potential for paravalvular leak, possible increased risk of need for permanent pacemaker placement, and other technical complications related to the procedure itself.  Long-term prognosis with medical therapy was discussed. This discussion was placed in the context of the patient's own specific clinical presentation and past medical history.  All of their questions have been addressed.  The patient desires an aggressive approach to his care and hopes to proceed with transcatheter aortic valve replacement in the near future.  We tentatively plan for surgery on September 14, 2018.    Following the decision to proceed with transcatheter aortic valve replacement, a discussion has been held regarding what types of management strategies would be attempted intraoperatively in the event of life-threatening complications, including the fact that the patient would be considered a candidate for the use of cardiopulmonary bypass and/or conversion to open sternotomy for attempted surgical intervention.  The patient has been advised of a variety of complications that might develop including but not limited to risks of death, stroke, paravalvular leak, aortic dissection or other major vascular complications, aortic annulus rupture, device embolization, cardiac rupture or perforation, mitral regurgitation, acute myocardial infarction, arrhythmia, heart block or bradycardia requiring permanent pacemaker placement, congestive heart failure, respiratory failure, renal failure, pneumonia, infection, other late complications related to structural valve deterioration or migration, or other complications that might ultimately cause a  temporary or permanent loss of functional independence or other long term morbidity.  The patient provides full informed consent for the procedure as described and all questions were answered.  The patient has been instructed to stop taking warfarin 7 days prior to surgery.   I spent in excess of 90 minutes during the conduct of this office consultation and >50% of this  time involved direct face-to-face encounter with the patient for counseling and/or coordination of their care.     Valentina Gu. Roxy Manns, MD 08/23/2018 10:59 AM

## 2018-08-23 NOTE — Addendum Note (Signed)
Addended by: Nuala Alpha on: 08/23/2018 09:15 AM   Modules accepted: Orders

## 2018-08-23 NOTE — Patient Instructions (Addendum)
Stop taking Coumadin (warfarin) 7 days prior to surgery on October 15 Continue taking all other medications without change through the day before surgery.  Have nothing to eat or drink after midnight the night before surgery.  On the morning of surgery do not take any medications

## 2018-08-23 NOTE — Telephone Encounter (Signed)
Please increase lasix to 40 mg PO BID

## 2018-08-24 DIAGNOSIS — I110 Hypertensive heart disease with heart failure: Secondary | ICD-10-CM | POA: Diagnosis not present

## 2018-08-24 DIAGNOSIS — I5032 Chronic diastolic (congestive) heart failure: Secondary | ICD-10-CM | POA: Diagnosis not present

## 2018-08-24 DIAGNOSIS — I2510 Atherosclerotic heart disease of native coronary artery without angina pectoris: Secondary | ICD-10-CM | POA: Diagnosis not present

## 2018-08-24 DIAGNOSIS — I482 Chronic atrial fibrillation: Secondary | ICD-10-CM | POA: Diagnosis not present

## 2018-08-24 DIAGNOSIS — I350 Nonrheumatic aortic (valve) stenosis: Secondary | ICD-10-CM | POA: Diagnosis not present

## 2018-08-24 DIAGNOSIS — R2681 Unsteadiness on feet: Secondary | ICD-10-CM | POA: Diagnosis not present

## 2018-08-27 ENCOUNTER — Other Ambulatory Visit: Payer: Self-pay

## 2018-08-27 DIAGNOSIS — I350 Nonrheumatic aortic (valve) stenosis: Secondary | ICD-10-CM

## 2018-08-28 ENCOUNTER — Other Ambulatory Visit: Payer: Self-pay | Admitting: Cardiology

## 2018-08-30 DIAGNOSIS — I5032 Chronic diastolic (congestive) heart failure: Secondary | ICD-10-CM | POA: Diagnosis not present

## 2018-08-30 DIAGNOSIS — I110 Hypertensive heart disease with heart failure: Secondary | ICD-10-CM | POA: Diagnosis not present

## 2018-08-30 DIAGNOSIS — I350 Nonrheumatic aortic (valve) stenosis: Secondary | ICD-10-CM | POA: Diagnosis not present

## 2018-08-30 DIAGNOSIS — I2510 Atherosclerotic heart disease of native coronary artery without angina pectoris: Secondary | ICD-10-CM | POA: Diagnosis not present

## 2018-08-30 DIAGNOSIS — R2681 Unsteadiness on feet: Secondary | ICD-10-CM | POA: Diagnosis not present

## 2018-08-30 DIAGNOSIS — I482 Chronic atrial fibrillation: Secondary | ICD-10-CM | POA: Diagnosis not present

## 2018-09-06 DIAGNOSIS — I5032 Chronic diastolic (congestive) heart failure: Secondary | ICD-10-CM | POA: Diagnosis not present

## 2018-09-06 DIAGNOSIS — I482 Chronic atrial fibrillation: Secondary | ICD-10-CM | POA: Diagnosis not present

## 2018-09-06 DIAGNOSIS — I2510 Atherosclerotic heart disease of native coronary artery without angina pectoris: Secondary | ICD-10-CM | POA: Diagnosis not present

## 2018-09-06 DIAGNOSIS — I350 Nonrheumatic aortic (valve) stenosis: Secondary | ICD-10-CM | POA: Diagnosis not present

## 2018-09-06 DIAGNOSIS — R2681 Unsteadiness on feet: Secondary | ICD-10-CM | POA: Diagnosis not present

## 2018-09-06 DIAGNOSIS — I110 Hypertensive heart disease with heart failure: Secondary | ICD-10-CM | POA: Diagnosis not present

## 2018-09-09 NOTE — Pre-Procedure Instructions (Signed)
[name] [name]  09/09/2018      [facility] [place], [place] - [place] [address] Phone: [phone] Fax: [phone]  [facility] [place], [place] [address] [place] [address] Phone: [phone] Fax: [phone]    Your procedure is scheduled on Tuesday October 22.  Report to [facility] Admitting at 5:30 A.M.  Call this number if you have problems the morning of surgery:  [phone]   Remember:  Do not eat or drink after midnight.    Take these medicines the morning of surgery with A SIP OF WATER: NONE  7 days prior to surgery STOP taking any Aleve, Naproxen, Ibuprofen, Motrin, Advil, Goody's, BC's, all herbal medications, fish oil, and all vitamins  STOP taking Coumadin on 09/07/18.     Do not wear jewelry  Do not wear lotions, powders, or colognes, or deodorant.  Do not shave 48 hours prior to surgery.  Men may shave face and neck.  Do not bring valuables to the hospital.  [facility] is not responsible for any belongings or valuables.  Contacts, dentures or bridgework may not be worn into surgery.  Leave your suitcase in the car.  After surgery it may be brought to your room.  For patients admitted to the hospital, discharge time will be determined by your treatment team.  Patients discharged the day of surgery will not be allowed to drive home.    Special instructions:    [facility]- Preparing For Surgery  Before surgery, you can play an important role. Because skin is not sterile, your skin needs to be as free of germs as possible. You can reduce the number of germs on your skin by washing with CHG (chlorahexidine gluconate) Soap before surgery.  CHG is an antiseptic cleaner which kills germs and bonds with the skin to continue killing germs even after washing.    Oral Hygiene is also important to reduce your risk of  infection.  Remember - BRUSH YOUR TEETH THE MORNING OF SURGERY WITH YOUR REGULAR TOOTHPASTE  Please do not use if you have an allergy to CHG or antibacterial soaps. If your skin becomes reddened/irritated stop using the CHG.  Do not shave (including legs and underarms) for at least 48 hours prior to first CHG shower. It is OK to shave your face.  Please follow these instructions carefully.   1. Shower the NIGHT BEFORE SURGERY and the MORNING OF SURGERY with CHG.   2. If you chose to wash your hair, wash your hair first as usual with your normal shampoo.  3. After you shampoo, rinse your hair and body thoroughly to remove the shampoo.  4. Use CHG as you would any other liquid soap. You can apply CHG directly to the skin and wash gently with a scrungie or a clean washcloth.   5. Apply the CHG Soap to your body ONLY FROM THE NECK DOWN.  Do not use on open wounds or open sores. Avoid contact with your eyes, ears, mouth and genitals (private parts). Wash Face and genitals (private parts)  with your normal soap.  6. Wash thoroughly, paying special attention to the area where your surgery will be performed.  7. Thoroughly rinse your body with warm water from the neck down.  8. DO NOT shower/wash with your normal soap after using and rinsing off the CHG Soap.  9. Pat yourself dry with a CLEAN TOWEL.  10. Wear CLEAN PAJAMAS to bed the night before surgery, wear comfortable clothes the morning of surgery  11. Place CLEAN SHEETS on your bed the night of your first shower and DO NOT SLEEP WITH PETS.    Day of Surgery:  Do not apply any deodorants/lotions.  Please wear clean clothes to the hospital/surgery center.   Remember to brush your teeth WITH YOUR REGULAR TOOTHPASTE.    Please read over the following fact sheets that you were given. Coughing and Deep Breathing, MRSA Information and Surgical Site Infection Prevention

## 2018-09-10 ENCOUNTER — Encounter (HOSPITAL_COMMUNITY)
Admission: RE | Admit: 2018-09-10 | Discharge: 2018-09-10 | Disposition: A | Discharge status: Home / Self Care | Payer: Medicare Other | Source: Ambulatory Visit | Attending: Cardiovascular Disease | Admitting: Cardiovascular Disease

## 2018-09-10 ENCOUNTER — Encounter (HOSPITAL_COMMUNITY): Payer: Self-pay

## 2018-09-10 ENCOUNTER — Ambulatory Visit (HOSPITAL_COMMUNITY)
Admission: RE | Admit: 2018-09-10 | Discharge: 2018-09-10 | Disposition: A | Discharge status: Home / Self Care | Payer: Medicare Other | Source: Ambulatory Visit | Attending: Cardiovascular Disease | Admitting: Cardiovascular Disease

## 2018-09-10 ENCOUNTER — Other Ambulatory Visit: Payer: Self-pay

## 2018-09-10 DIAGNOSIS — I4891 Unspecified atrial fibrillation: Secondary | ICD-10-CM | POA: Insufficient documentation

## 2018-09-10 DIAGNOSIS — I350 Nonrheumatic aortic (valve) stenosis: Secondary | ICD-10-CM | POA: Insufficient documentation

## 2018-09-10 DIAGNOSIS — I493 Ventricular premature depolarization: Secondary | ICD-10-CM | POA: Diagnosis not present

## 2018-09-10 DIAGNOSIS — Z01818 Encounter for other preprocedural examination: Secondary | ICD-10-CM | POA: Insufficient documentation

## 2018-09-10 DIAGNOSIS — Z01810 Encounter for preprocedural cardiovascular examination: Secondary | ICD-10-CM | POA: Diagnosis not present

## 2018-09-10 DIAGNOSIS — J449 Chronic obstructive pulmonary disease, unspecified: Secondary | ICD-10-CM | POA: Diagnosis not present

## 2018-09-10 DIAGNOSIS — R918 Other nonspecific abnormal finding of lung field: Secondary | ICD-10-CM | POA: Diagnosis not present

## 2018-09-10 DIAGNOSIS — Z01812 Encounter for preprocedural laboratory examination: Secondary | ICD-10-CM | POA: Insufficient documentation

## 2018-09-10 HISTORY — DX: Cerebral infarction, unspecified: I63.9

## 2018-09-10 LAB — BLOOD GAS, ARTERIAL
ACID-BASE EXCESS: 7 mmol/L — AB (ref 0.0–2.0)
BICARBONATE: 30.5 mmol/L — AB (ref 20.0–28.0)
Drawn by: 421801
FIO2: 21
O2 Saturation: 98.6 %
PATIENT TEMPERATURE: 98.6
PO2 ART: 111 mmHg — AB (ref 83.0–108.0)
pCO2 arterial: 39.8 mmHg (ref 32.0–48.0)
pH, Arterial: 7.496 — ABNORMAL HIGH (ref 7.350–7.450)

## 2018-09-10 LAB — CBC
HEMATOCRIT: 31.4 % — AB (ref 39.0–52.0)
Hemoglobin: 9.7 g/dL — ABNORMAL LOW (ref 13.0–17.0)
MCH: 29.1 pg (ref 26.0–34.0)
MCHC: 30.9 g/dL (ref 30.0–36.0)
MCV: 94.3 fL (ref 80.0–100.0)
NRBC: 0 % (ref 0.0–0.2)
Platelets: 235 10*3/uL (ref 150–400)
RBC: 3.33 MIL/uL — ABNORMAL LOW (ref 4.22–5.81)
RDW: 16.2 % — AB (ref 11.5–15.5)
WBC: 7.5 10*3/uL (ref 4.0–10.5)

## 2018-09-10 LAB — HEMOGLOBIN A1C
HEMOGLOBIN A1C: 5 % (ref 4.8–5.6)
MEAN PLASMA GLUCOSE: 96.8 mg/dL

## 2018-09-10 LAB — TYPE AND SCREEN
ABO/RH(D): A POS
Antibody Screen: NEGATIVE

## 2018-09-10 LAB — COMPREHENSIVE METABOLIC PANEL
ALT: 14 U/L (ref 0–44)
AST: 26 U/L (ref 15–41)
Albumin: 3.5 g/dL (ref 3.5–5.0)
Alkaline Phosphatase: 41 U/L (ref 38–126)
Anion gap: 10 (ref 5–15)
BILIRUBIN TOTAL: 0.6 mg/dL (ref 0.3–1.2)
BUN: 18 mg/dL (ref 8–23)
CO2: 27 mmol/L (ref 22–32)
CREATININE: 1.13 mg/dL (ref 0.61–1.24)
Calcium: 9.4 mg/dL (ref 8.9–10.3)
Chloride: 97 mmol/L — ABNORMAL LOW (ref 98–111)
GFR calc Af Amer: >60 mL/min (ref >60)
GFR, EST NON AFRICAN AMERICAN: 55 mL/min — AB (ref >60)
Glucose, Bld: 100 mg/dL — ABNORMAL HIGH (ref 70–99)
POTASSIUM: 3.3 mmol/L — AB (ref 3.5–5.1)
Sodium: 134 mmol/L — ABNORMAL LOW (ref 135–145)
TOTAL PROTEIN: 6.3 g/dL — AB (ref 6.5–8.1)

## 2018-09-10 LAB — ABO/RH: ABO/RH(D): A POS

## 2018-09-10 LAB — PROTIME-INR
INR: 1.35
PROTHROMBIN TIME: 16.6 s — AB (ref 11.4–15.2)

## 2018-09-10 LAB — SURGICAL PCR SCREEN
MRSA, PCR: NEGATIVE
Staphylococcus aureus: NEGATIVE

## 2018-09-10 LAB — BRAIN NATRIURETIC PEPTIDE: B Natriuretic Peptide: 256.7 pg/mL — ABNORMAL HIGH (ref 0.0–100.0)

## 2018-09-10 LAB — URINALYSIS, ROUTINE W REFLEX MICROSCOPIC
Bilirubin Urine: NEGATIVE
GLUCOSE, UA: NEGATIVE mg/dL
Hgb urine dipstick: NEGATIVE
Ketones, ur: NEGATIVE mg/dL
LEUKOCYTES UA: NEGATIVE
Nitrite: NEGATIVE
PROTEIN: NEGATIVE mg/dL
SPECIFIC GRAVITY, URINE: 1.014 (ref 1.005–1.030)
pH: 7 (ref 5.0–8.0)

## 2018-09-10 LAB — APTT: APTT: 36 s (ref 24–36)

## 2018-09-10 NOTE — Progress Notes (Signed)
PCP - [name] NP Cardiologist - Dr. Ena Dawley  Chest x-ray - 09/10/18 EKG - 09/10/18 Stress Test - 2015 ECHO - 2019 Cardiac Cath - 2019  Blood Thinner Instructions: pt last dose of coumadin on 09/07/18 Aspirin Instructions: N/A  Anesthesia review: Cardiac Hx  Patient denies shortness of breath, fever, cough and chest pain at PAT appointment   Patient verbalized understanding of instructions that were given to them at the PAT appointment. Patient was also instructed that they will need to review over the PAT instructions again at home before surgery.

## 2018-09-13 ENCOUNTER — Encounter (HOSPITAL_COMMUNITY): Payer: Self-pay | Admitting: Certified Registered Nurse Anesthetist

## 2018-09-13 MED ORDER — NITROGLYCERIN IN D5W 200-5 MCG/ML-% IV SOLN
2.0000 ug/min | INTRAVENOUS | Status: DC
  Filled 2018-09-13: qty 250

## 2018-09-13 MED ORDER — VANCOMYCIN HCL 10 G IV SOLR
1250.0000 mg | INTRAVENOUS | Status: AC
  Administered 2018-09-14: 1250 mg via INTRAVENOUS
  Filled 2018-09-13: qty 1250

## 2018-09-13 MED ORDER — TRANEXAMIC ACID (OHS) PUMP PRIME SOLUTION
2.0000 mg/kg | INTRAVENOUS | Status: DC
  Filled 2018-09-13: qty 1.66

## 2018-09-13 MED ORDER — TRANEXAMIC ACID (OHS) BOLUS VIA INFUSION
15.0000 mg/kg | INTRAVENOUS | Status: DC
  Filled 2018-09-13: qty 1244

## 2018-09-13 MED ORDER — TRANEXAMIC ACID 1000 MG/10ML IV SOLN
1.5000 mg/kg/h | INTRAVENOUS | Status: DC
  Filled 2018-09-13: qty 25

## 2018-09-13 MED ORDER — SODIUM CHLORIDE 0.9 % IV SOLN
750.0000 mg | INTRAVENOUS | Status: DC
  Filled 2018-09-13: qty 750

## 2018-09-13 MED ORDER — SODIUM CHLORIDE 0.9 % IV SOLN
1.5000 g | INTRAVENOUS | Status: AC
  Administered 2018-09-14: 1.5 g via INTRAVENOUS
  Filled 2018-09-13: qty 1.5

## 2018-09-13 MED ORDER — MILRINONE LACTATE IN DEXTROSE 20-5 MG/100ML-% IV SOLN
0.3000 ug/kg/min | INTRAVENOUS | Status: DC
  Filled 2018-09-13: qty 100

## 2018-09-13 MED ORDER — INSULIN REGULAR(HUMAN) IN NACL 100-0.9 UT/100ML-% IV SOLN
INTRAVENOUS | Status: DC
  Filled 2018-09-13: qty 100

## 2018-09-13 MED ORDER — MAGNESIUM SULFATE 50 % IJ SOLN
40.0000 meq | INTRAMUSCULAR | Status: DC
  Filled 2018-09-13: qty 9.85

## 2018-09-13 MED ORDER — EPINEPHRINE PF 1 MG/ML IJ SOLN
0.0000 ug/min | INTRAVENOUS | Status: DC
  Filled 2018-09-13: qty 4

## 2018-09-13 MED ORDER — DOPAMINE-DEXTROSE 3.2-5 MG/ML-% IV SOLN
0.0000 ug/kg/min | INTRAVENOUS | Status: DC
  Filled 2018-09-13: qty 250

## 2018-09-13 MED ORDER — SODIUM CHLORIDE 0.9 % IV SOLN
INTRAVENOUS | Status: DC
  Filled 2018-09-13: qty 30

## 2018-09-13 MED ORDER — PLASMA-LYTE 148 IV SOLN
INTRAVENOUS | Status: DC
  Filled 2018-09-13: qty 2.5

## 2018-09-13 MED ORDER — POTASSIUM CHLORIDE 2 MEQ/ML IV SOLN
80.0000 meq | INTRAVENOUS | Status: DC
  Filled 2018-09-13: qty 40

## 2018-09-13 MED ORDER — PHENYLEPHRINE HCL-NACL 20-0.9 MG/250ML-% IV SOLN
30.0000 ug/min | INTRAVENOUS | Status: DC
  Filled 2018-09-13: qty 250

## 2018-09-13 MED ORDER — DEXMEDETOMIDINE HCL IN NACL 400 MCG/100ML IV SOLN
0.1000 ug/kg/h | INTRAVENOUS | Status: AC
  Administered 2018-09-14: 1 ug/kg/h via INTRAVENOUS
  Filled 2018-09-13: qty 100

## 2018-09-14 ENCOUNTER — Inpatient Hospital Stay (HOSPITAL_COMMUNITY): Payer: Medicare Other

## 2018-09-14 ENCOUNTER — Inpatient Hospital Stay (HOSPITAL_COMMUNITY): Payer: Medicare Other | Admitting: Physician Assistant

## 2018-09-14 ENCOUNTER — Inpatient Hospital Stay (HOSPITAL_COMMUNITY): Payer: Medicare Other | Admitting: Certified Registered Nurse Anesthetist

## 2018-09-14 ENCOUNTER — Other Ambulatory Visit: Payer: Self-pay

## 2018-09-14 ENCOUNTER — Encounter (HOSPITAL_COMMUNITY): Payer: Self-pay | Admitting: *Deleted

## 2018-09-14 ENCOUNTER — Inpatient Hospital Stay (HOSPITAL_COMMUNITY)
Admission: RE | Admit: 2018-09-14 | Discharge: 2018-09-17 | DRG: 266 | Disposition: A | Discharge status: Skilled Nursing Facility | Payer: Medicare Other | Attending: Cardiovascular Disease | Admitting: Cardiovascular Disease

## 2018-09-14 ENCOUNTER — Encounter (HOSPITAL_COMMUNITY)
Admission: RE | Disposition: A | Discharge status: Skilled Nursing Facility | Payer: Self-pay | Source: Home / Self Care | Attending: Cardiovascular Disease

## 2018-09-14 DIAGNOSIS — Z23 Encounter for immunization: Secondary | ICD-10-CM | POA: Diagnosis not present

## 2018-09-14 DIAGNOSIS — Z7901 Long term (current) use of anticoagulants: Secondary | ICD-10-CM

## 2018-09-14 DIAGNOSIS — I679 Cerebrovascular disease, unspecified: Secondary | ICD-10-CM | POA: Diagnosis present

## 2018-09-14 DIAGNOSIS — I739 Peripheral vascular disease, unspecified: Secondary | ICD-10-CM

## 2018-09-14 DIAGNOSIS — Z8249 Family history of ischemic heart disease and other diseases of the circulatory system: Secondary | ICD-10-CM

## 2018-09-14 DIAGNOSIS — I779 Disorder of arteries and arterioles, unspecified: Secondary | ICD-10-CM | POA: Diagnosis present

## 2018-09-14 DIAGNOSIS — E785 Hyperlipidemia, unspecified: Secondary | ICD-10-CM | POA: Diagnosis present

## 2018-09-14 DIAGNOSIS — I252 Old myocardial infarction: Secondary | ICD-10-CM | POA: Diagnosis not present

## 2018-09-14 DIAGNOSIS — E876 Hypokalemia: Secondary | ICD-10-CM | POA: Diagnosis present

## 2018-09-14 DIAGNOSIS — I4891 Unspecified atrial fibrillation: Secondary | ICD-10-CM | POA: Diagnosis present

## 2018-09-14 DIAGNOSIS — Z952 Presence of prosthetic heart valve: Secondary | ICD-10-CM | POA: Diagnosis not present

## 2018-09-14 DIAGNOSIS — E039 Hypothyroidism, unspecified: Secondary | ICD-10-CM | POA: Diagnosis not present

## 2018-09-14 DIAGNOSIS — R600 Localized edema: Secondary | ICD-10-CM | POA: Diagnosis present

## 2018-09-14 DIAGNOSIS — Z9842 Cataract extraction status, left eye: Secondary | ICD-10-CM

## 2018-09-14 DIAGNOSIS — Z888 Allergy status to other drugs, medicaments and biological substances status: Secondary | ICD-10-CM

## 2018-09-14 DIAGNOSIS — I350 Nonrheumatic aortic (valve) stenosis: Secondary | ICD-10-CM | POA: Diagnosis present

## 2018-09-14 DIAGNOSIS — I2510 Atherosclerotic heart disease of native coronary artery without angina pectoris: Secondary | ICD-10-CM | POA: Diagnosis present

## 2018-09-14 DIAGNOSIS — M069 Rheumatoid arthritis, unspecified: Secondary | ICD-10-CM | POA: Diagnosis present

## 2018-09-14 DIAGNOSIS — Z87891 Personal history of nicotine dependence: Secondary | ICD-10-CM

## 2018-09-14 DIAGNOSIS — I5043 Acute on chronic combined systolic (congestive) and diastolic (congestive) heart failure: Secondary | ICD-10-CM | POA: Diagnosis present

## 2018-09-14 DIAGNOSIS — R2681 Unsteadiness on feet: Secondary | ICD-10-CM | POA: Diagnosis present

## 2018-09-14 DIAGNOSIS — I110 Hypertensive heart disease with heart failure: Secondary | ICD-10-CM | POA: Diagnosis present

## 2018-09-14 DIAGNOSIS — R05 Cough: Secondary | ICD-10-CM | POA: Diagnosis not present

## 2018-09-14 DIAGNOSIS — Z006 Encounter for examination for normal comparison and control in clinical research program: Secondary | ICD-10-CM | POA: Diagnosis not present

## 2018-09-14 DIAGNOSIS — J449 Chronic obstructive pulmonary disease, unspecified: Secondary | ICD-10-CM | POA: Diagnosis present

## 2018-09-14 DIAGNOSIS — Z8673 Personal history of transient ischemic attack (TIA), and cerebral infarction without residual deficits: Secondary | ICD-10-CM

## 2018-09-14 DIAGNOSIS — Z961 Presence of intraocular lens: Secondary | ICD-10-CM | POA: Diagnosis present

## 2018-09-14 DIAGNOSIS — Z79899 Other long term (current) drug therapy: Secondary | ICD-10-CM

## 2018-09-14 DIAGNOSIS — I447 Left bundle-branch block, unspecified: Secondary | ICD-10-CM | POA: Diagnosis present

## 2018-09-14 DIAGNOSIS — I5033 Acute on chronic diastolic (congestive) heart failure: Secondary | ICD-10-CM | POA: Diagnosis not present

## 2018-09-14 DIAGNOSIS — R278 Other lack of coordination: Secondary | ICD-10-CM | POA: Diagnosis not present

## 2018-09-14 DIAGNOSIS — D696 Thrombocytopenia, unspecified: Secondary | ICD-10-CM | POA: Diagnosis present

## 2018-09-14 DIAGNOSIS — I10 Essential (primary) hypertension: Secondary | ICD-10-CM | POA: Diagnosis not present

## 2018-09-14 DIAGNOSIS — Z881 Allergy status to other antibiotic agents status: Secondary | ICD-10-CM

## 2018-09-14 DIAGNOSIS — R0989 Other specified symptoms and signs involving the circulatory and respiratory systems: Secondary | ICD-10-CM | POA: Diagnosis not present

## 2018-09-14 DIAGNOSIS — Z85828 Personal history of other malignant neoplasm of skin: Secondary | ICD-10-CM

## 2018-09-14 DIAGNOSIS — K219 Gastro-esophageal reflux disease without esophagitis: Secondary | ICD-10-CM | POA: Diagnosis present

## 2018-09-14 DIAGNOSIS — I4811 Longstanding persistent atrial fibrillation: Secondary | ICD-10-CM | POA: Diagnosis present

## 2018-09-14 DIAGNOSIS — Z7401 Bed confinement status: Secondary | ICD-10-CM | POA: Diagnosis not present

## 2018-09-14 DIAGNOSIS — M2550 Pain in unspecified joint: Secondary | ICD-10-CM | POA: Diagnosis not present

## 2018-09-14 DIAGNOSIS — J189 Pneumonia, unspecified organism: Secondary | ICD-10-CM | POA: Diagnosis not present

## 2018-09-14 DIAGNOSIS — D509 Iron deficiency anemia, unspecified: Secondary | ICD-10-CM | POA: Diagnosis present

## 2018-09-14 DIAGNOSIS — R1311 Dysphagia, oral phase: Secondary | ICD-10-CM | POA: Diagnosis not present

## 2018-09-14 DIAGNOSIS — Z9841 Cataract extraction status, right eye: Secondary | ICD-10-CM | POA: Diagnosis not present

## 2018-09-14 DIAGNOSIS — E43 Unspecified severe protein-calorie malnutrition: Secondary | ICD-10-CM | POA: Diagnosis present

## 2018-09-14 DIAGNOSIS — Z8349 Family history of other endocrine, nutritional and metabolic diseases: Secondary | ICD-10-CM

## 2018-09-14 DIAGNOSIS — R059 Cough, unspecified: Secondary | ICD-10-CM

## 2018-09-14 DIAGNOSIS — Z7982 Long term (current) use of aspirin: Secondary | ICD-10-CM

## 2018-09-14 DIAGNOSIS — I340 Nonrheumatic mitral (valve) insufficiency: Secondary | ICD-10-CM | POA: Diagnosis not present

## 2018-09-14 DIAGNOSIS — Z48812 Encounter for surgical aftercare following surgery on the circulatory system: Secondary | ICD-10-CM | POA: Diagnosis not present

## 2018-09-14 DIAGNOSIS — R41841 Cognitive communication deficit: Secondary | ICD-10-CM | POA: Diagnosis not present

## 2018-09-14 DIAGNOSIS — R2689 Other abnormalities of gait and mobility: Secondary | ICD-10-CM | POA: Diagnosis not present

## 2018-09-14 HISTORY — DX: Presence of prosthetic heart valve: Z95.2

## 2018-09-14 HISTORY — PX: TRANSCATHETER AORTIC VALVE REPLACEMENT, TRANSFEMORAL: SHX6400

## 2018-09-14 HISTORY — DX: Chronic diastolic (congestive) heart failure: I50.32

## 2018-09-14 HISTORY — PX: INTRAOPERATIVE TRANSTHORACIC ECHOCARDIOGRAM: SHX6523

## 2018-09-14 LAB — POCT I-STAT, CHEM 8
BUN: 20 mg/dL (ref 8–23)
BUN: 19 mg/dL (ref 8–23)
BUN: 19 mg/dL (ref 8–23)
CALCIUM ION: 1.17 mmol/L (ref 1.15–1.40)
CALCIUM ION: 1.15 mmol/L (ref 1.15–1.40)
CREATININE: 1.1 mg/dL (ref 0.61–1.24)
CREATININE: 1 mg/dL (ref 0.61–1.24)
Calcium, Ion: 1.17 mmol/L (ref 1.15–1.40)
Chloride: 95 mmol/L — ABNORMAL LOW (ref 98–111)
Chloride: 95 mmol/L — ABNORMAL LOW (ref 98–111)
Chloride: 96 mmol/L — ABNORMAL LOW (ref 98–111)
Creatinine, Ser: 1 mg/dL (ref 0.61–1.24)
GLUCOSE: 122 mg/dL — AB (ref 70–99)
GLUCOSE: 138 mg/dL — AB (ref 70–99)
Glucose, Bld: 128 mg/dL — ABNORMAL HIGH (ref 70–99)
HCT: 27 % — ABNORMAL LOW (ref 39.0–52.0)
HCT: 25 % — ABNORMAL LOW (ref 39.0–52.0)
HEMATOCRIT: 23 % — AB (ref 39.0–52.0)
HEMOGLOBIN: 9.2 g/dL — AB (ref 13.0–17.0)
HEMOGLOBIN: 8.5 g/dL — AB (ref 13.0–17.0)
HEMOGLOBIN: 7.8 g/dL — AB (ref 13.0–17.0)
Potassium: 3.2 mmol/L — ABNORMAL LOW (ref 3.5–5.1)
Potassium: 3.2 mmol/L — ABNORMAL LOW (ref 3.5–5.1)
Potassium: 3.1 mmol/L — ABNORMAL LOW (ref 3.5–5.1)
SODIUM: 136 mmol/L (ref 135–145)
Sodium: 135 mmol/L (ref 135–145)
Sodium: 136 mmol/L (ref 135–145)
TCO2: 29 mmol/L (ref 22–32)
TCO2: 29 mmol/L (ref 22–32)
TCO2: 29 mmol/L (ref 22–32)

## 2018-09-14 LAB — HEMOGLOBIN AND HEMATOCRIT, BLOOD
HCT: 26.7 % — ABNORMAL LOW (ref 39.0–52.0)
Hemoglobin: 8.6 g/dL — ABNORMAL LOW (ref 13.0–17.0)

## 2018-09-14 SURGERY — IMPLANTATION, AORTIC VALVE, TRANSCATHETER, FEMORAL APPROACH
Anesthesia: Monitor Anesthesia Care | Site: Chest

## 2018-09-14 MED ORDER — IODIXANOL 320 MG/ML IV SOLN
INTRAVENOUS | Status: DC | PRN
  Administered 2018-09-14: 54 mL via INTRAVENOUS

## 2018-09-14 MED ORDER — METOPROLOL TARTRATE 5 MG/5ML IV SOLN: 2.5000 mg | INTRAVENOUS | Status: DC | PRN

## 2018-09-14 MED ORDER — NOREPINEPHRINE BITARTRATE 1 MG/ML IV SOLN
INTRAVENOUS | Status: DC | PRN
  Administered 2018-09-14: 1 ug/min via INTRAVENOUS

## 2018-09-14 MED ORDER — HEPARIN SODIUM (PORCINE) 1000 UNIT/ML IJ SOLN
INTRAMUSCULAR | Status: DC | PRN
  Administered 2018-09-14: 12000 [IU] via INTRAVENOUS

## 2018-09-14 MED ORDER — NITROGLYCERIN IN D5W 200-5 MCG/ML-% IV SOLN: 0.0000 ug/min | INTRAVENOUS | Status: DC

## 2018-09-14 MED ORDER — SODIUM CHLORIDE 0.9% FLUSH: 3.0000 mL | INTRAVENOUS | Status: DC | PRN

## 2018-09-14 MED ORDER — LIDOCAINE HCL 1 % IJ SOLN
INTRAMUSCULAR | Status: AC
  Filled 2018-09-14: qty 20

## 2018-09-14 MED ORDER — OXYCODONE HCL 5 MG PO TABS: 5.0000 mg | ORAL_TABLET | ORAL | Status: DC | PRN

## 2018-09-14 MED ORDER — LIDOCAINE HCL 1 % IJ SOLN
INTRAMUSCULAR | Status: DC | PRN
  Administered 2018-09-14: 14 mL

## 2018-09-14 MED ORDER — FOLIC ACID 1 MG PO TABS
1.0000 mg | ORAL_TABLET | Freq: Every day | ORAL | Status: DC
  Administered 2018-09-14 – 2018-09-17 (×4): 1 mg via ORAL
  Filled 2018-09-14 (×4): qty 1

## 2018-09-14 MED ORDER — SODIUM CHLORIDE 0.9 % IJ SOLN
INTRAMUSCULAR | Status: AC
  Filled 2018-09-14: qty 10

## 2018-09-14 MED ORDER — PROPOFOL 10 MG/ML IV BOLUS
INTRAVENOUS | Status: AC
  Filled 2018-09-14: qty 20

## 2018-09-14 MED ORDER — CHLORHEXIDINE GLUCONATE 0.12 % MT SOLN
15.0000 mL | Freq: Once | OROMUCOSAL | Status: AC
  Administered 2018-09-14: 15 mL via OROMUCOSAL

## 2018-09-14 MED ORDER — TRAMADOL HCL 50 MG PO TABS: 50.0000 mg | ORAL_TABLET | ORAL | Status: DC | PRN

## 2018-09-14 MED ORDER — CHLORHEXIDINE GLUCONATE 4 % EX LIQD: 30.0000 mL | CUTANEOUS | Status: DC

## 2018-09-14 MED ORDER — LACTATED RINGERS IV SOLN
INTRAVENOUS | Status: DC | PRN
  Administered 2018-09-14: 07:00:00 via INTRAVENOUS

## 2018-09-14 MED ORDER — POTASSIUM CHLORIDE 10 MEQ/100ML IV SOLN
INTRAVENOUS | Status: AC
  Filled 2018-09-14: qty 100

## 2018-09-14 MED ORDER — SODIUM CHLORIDE 0.9 % IV BOLUS: 500.0000 mL | Freq: Once | INTRAVENOUS | Status: AC

## 2018-09-14 MED ORDER — ASPIRIN 81 MG PO CHEW
81.0000 mg | CHEWABLE_TABLET | Freq: Every day | ORAL | Status: DC
  Administered 2018-09-15 – 2018-09-16 (×2): 81 mg via ORAL
  Filled 2018-09-14 (×2): qty 1

## 2018-09-14 MED ORDER — SODIUM CHLORIDE 0.9 % IV SOLN
INTRAVENOUS | Status: AC
  Filled 2018-09-14 (×3): qty 1.2

## 2018-09-14 MED ORDER — PROPOFOL 10 MG/ML IV BOLUS
INTRAVENOUS | Status: DC | PRN
  Administered 2018-09-14: 10 mg via INTRAVENOUS

## 2018-09-14 MED ORDER — MIDAZOLAM HCL 2 MG/2ML IJ SOLN
INTRAMUSCULAR | Status: AC
  Filled 2018-09-14: qty 2

## 2018-09-14 MED ORDER — SODIUM CHLORIDE 0.9 % IV SOLN: INTRAVENOUS | Status: DC

## 2018-09-14 MED ORDER — CHLORHEXIDINE GLUCONATE 4 % EX LIQD: 60.0000 mL | Freq: Once | CUTANEOUS | Status: DC

## 2018-09-14 MED ORDER — SODIUM CHLORIDE 0.9 % IV SOLN: 250.0000 mL | INTRAVENOUS | Status: DC | PRN

## 2018-09-14 MED ORDER — SODIUM CHLORIDE 0.9% FLUSH
3.0000 mL | Freq: Two times a day (BID) | INTRAVENOUS | Status: DC
  Administered 2018-09-14 – 2018-09-15 (×2): 3 mL via INTRAVENOUS

## 2018-09-14 MED ORDER — INFLUENZA VAC SPLIT HIGH-DOSE 0.5 ML IM SUSY
0.5000 mL | PREFILLED_SYRINGE | INTRAMUSCULAR | Status: AC
  Administered 2018-09-15: 0.5 mL via INTRAMUSCULAR
  Filled 2018-09-14: qty 0.5

## 2018-09-14 MED ORDER — POTASSIUM CHLORIDE 10 MEQ/50ML IV SOLN
10.0000 meq | INTRAVENOUS | Status: DC | PRN
  Administered 2018-09-14 – 2018-09-15 (×2): 10 meq via INTRAVENOUS
  Filled 2018-09-14: qty 50

## 2018-09-14 MED ORDER — CHLORHEXIDINE GLUCONATE 0.12 % MT SOLN
OROMUCOSAL | Status: AC
  Administered 2018-09-14: 15 mL via OROMUCOSAL
  Filled 2018-09-14: qty 15

## 2018-09-14 MED ORDER — ATORVASTATIN CALCIUM 10 MG PO TABS
10.0000 mg | ORAL_TABLET | Freq: Every day | ORAL | Status: DC
  Administered 2018-09-14 – 2018-09-16 (×3): 10 mg via ORAL
  Filled 2018-09-14 (×3): qty 1

## 2018-09-14 MED ORDER — DEXMEDETOMIDINE HCL 200 MCG/2ML IV SOLN
INTRAVENOUS | Status: DC | PRN
  Administered 2018-09-14: 81.6 ug via INTRAVENOUS

## 2018-09-14 MED ORDER — SODIUM CHLORIDE 0.9 % IV SOLN
INTRAVENOUS | Status: DC | PRN
  Administered 2018-09-14: 1500 mL

## 2018-09-14 MED ORDER — FENTANYL CITRATE (PF) 250 MCG/5ML IJ SOLN
INTRAMUSCULAR | Status: AC
  Filled 2018-09-14: qty 5

## 2018-09-14 MED ORDER — PHENYLEPHRINE HCL-NACL 20-0.9 MG/250ML-% IV SOLN
0.0000 ug/min | INTRAVENOUS | Status: DC
  Filled 2018-09-14: qty 250

## 2018-09-14 MED ORDER — PROTAMINE SULFATE 10 MG/ML IV SOLN
INTRAVENOUS | Status: DC | PRN
  Administered 2018-09-14: 120 mg via INTRAVENOUS

## 2018-09-14 MED ORDER — MIDAZOLAM HCL 5 MG/5ML IJ SOLN
INTRAMUSCULAR | Status: DC | PRN
  Administered 2018-09-14: 0.5 mg via INTRAVENOUS

## 2018-09-14 MED ORDER — ONDANSETRON HCL 4 MG/2ML IJ SOLN
INTRAMUSCULAR | Status: DC | PRN
  Administered 2018-09-14: 4 mg via INTRAVENOUS

## 2018-09-14 MED ORDER — FENTANYL CITRATE (PF) 100 MCG/2ML IJ SOLN
INTRAMUSCULAR | Status: DC | PRN
  Administered 2018-09-14: 50 ug via INTRAVENOUS

## 2018-09-14 MED ORDER — ACETAMINOPHEN 650 MG RE SUPP: 650.0000 mg | Freq: Four times a day (QID) | RECTAL | Status: DC | PRN

## 2018-09-14 MED ORDER — SODIUM CHLORIDE 0.9 % IV SOLN
1.5000 g | Freq: Two times a day (BID) | INTRAVENOUS | Status: AC
  Administered 2018-09-14 – 2018-09-16 (×4): 1.5 g via INTRAVENOUS
  Filled 2018-09-14 (×4): qty 1.5

## 2018-09-14 MED ORDER — VANCOMYCIN HCL IN DEXTROSE 1-5 GM/200ML-% IV SOLN
1000.0000 mg | Freq: Once | INTRAVENOUS | Status: AC
  Administered 2018-09-14: 1000 mg via INTRAVENOUS
  Filled 2018-09-14: qty 200

## 2018-09-14 MED ORDER — ONDANSETRON HCL 4 MG/2ML IJ SOLN
INTRAMUSCULAR | Status: AC
  Filled 2018-09-14: qty 2

## 2018-09-14 MED ORDER — PHENYLEPHRINE 40 MCG/ML (10ML) SYRINGE FOR IV PUSH (FOR BLOOD PRESSURE SUPPORT)
PREFILLED_SYRINGE | INTRAVENOUS | Status: AC
  Filled 2018-09-14: qty 10

## 2018-09-14 MED ORDER — MORPHINE SULFATE (PF) 2 MG/ML IV SOLN: 2.0000 mg | INTRAVENOUS | Status: DC | PRN

## 2018-09-14 MED ORDER — TAMSULOSIN HCL 0.4 MG PO CAPS
0.4000 mg | ORAL_CAPSULE | Freq: Every evening | ORAL | Status: DC
  Administered 2018-09-14 – 2018-09-17 (×4): 0.4 mg via ORAL
  Filled 2018-09-14 (×4): qty 1

## 2018-09-14 MED ORDER — SODIUM CHLORIDE 0.9 % IV SOLN
INTRAVENOUS | Status: AC
  Administered 2018-09-14: 10:00:00 via INTRAVENOUS

## 2018-09-14 MED ORDER — ONDANSETRON HCL 4 MG/2ML IJ SOLN: 4.0000 mg | Freq: Four times a day (QID) | INTRAMUSCULAR | Status: DC | PRN

## 2018-09-14 MED ORDER — ACETAMINOPHEN 325 MG PO TABS
650.0000 mg | ORAL_TABLET | Freq: Four times a day (QID) | ORAL | Status: DC | PRN
  Administered 2018-09-15: 650 mg via ORAL
  Filled 2018-09-14: qty 2

## 2018-09-14 SURGICAL SUPPLY — 91 items
BAG DECANTER FOR FLEXI CONT (MISCELLANEOUS) IMPLANT
BAG SNAP BAND KOVER 36X36 (MISCELLANEOUS) ×10 IMPLANT
BLADE CLIPPER SURG (BLADE) IMPLANT
BLADE STERNUM SYSTEM 6 (BLADE) IMPLANT
CABLE ADAPT CONN TEMP 6FT (ADAPTER) ×5 IMPLANT
CANISTER SUCT 3000ML PPV (MISCELLANEOUS) IMPLANT
CANNULA FEM VENOUS REMOTE 22FR (CANNULA) IMPLANT
CANNULA OPTISITE PERFUSION 16F (CANNULA) IMPLANT
CANNULA OPTISITE PERFUSION 18F (CANNULA) IMPLANT
CATH DIAG EXPO 6F VENT PIG 145 (CATHETERS) ×10 IMPLANT
CATH EXPO 5FR AL1 (CATHETERS) IMPLANT
CATH INFINITI 6F AL2 (CATHETERS) IMPLANT
CATH S G BIP PACING (SET/KITS/TRAYS/PACK) ×5 IMPLANT
CATH STRAIGHT 5FR 65CM (CATHETERS) ×5 IMPLANT
CLIP VESOCCLUDE MED 24/CT (CLIP) ×5 IMPLANT
CLIP VESOCCLUDE SM WIDE 24/CT (CLIP) ×5 IMPLANT
CONT SPEC 4OZ CLIKSEAL STRL BL (MISCELLANEOUS) ×10 IMPLANT
COVER BACK TABLE 80X110 HD (DRAPES) IMPLANT
COVER DOME SNAP 22 D (MISCELLANEOUS) IMPLANT
COVER WAND RF STERILE (DRAPES) ×5 IMPLANT
CRADLE DONUT ADULT HEAD (MISCELLANEOUS) ×5 IMPLANT
DERMABOND ADVANCED (GAUZE/BANDAGES/DRESSINGS) ×2
DERMABOND ADVANCED .7 DNX12 (GAUZE/BANDAGES/DRESSINGS) ×3 IMPLANT
DEVICE CLOSURE PERCLS PRGLD 6F (VASCULAR PRODUCTS) ×6 IMPLANT
DRAPE INCISE IOBAN 66X45 STRL (DRAPES) IMPLANT
DRSG TEGADERM 4X4.75 (GAUZE/BANDAGES/DRESSINGS) ×10 IMPLANT
ELECT CAUTERY BLADE 6.4 (BLADE) IMPLANT
ELECT REM PT RETURN 9FT ADLT (ELECTROSURGICAL) ×10
ELECTRODE REM PT RTRN 9FT ADLT (ELECTROSURGICAL) ×6 IMPLANT
FELT TEFLON 6X6 (MISCELLANEOUS) ×5 IMPLANT
FEMORAL VENOUS CANN RAP (CANNULA) IMPLANT
GAUZE SPONGE 4X4 12PLY STRL (GAUZE/BANDAGES/DRESSINGS) ×5 IMPLANT
GLOVE BIO SURGEON STRL SZ7.5 (GLOVE) IMPLANT
GLOVE BIO SURGEON STRL SZ8 (GLOVE) IMPLANT
GLOVE EUDERMIC 7 POWDERFREE (GLOVE) IMPLANT
GLOVE ORTHO TXT STRL SZ7.5 (GLOVE) IMPLANT
GOWN STRL REUS W/ TWL LRG LVL3 (GOWN DISPOSABLE) IMPLANT
GOWN STRL REUS W/ TWL XL LVL3 (GOWN DISPOSABLE) ×3 IMPLANT
GOWN STRL REUS W/TWL LRG LVL3 (GOWN DISPOSABLE)
GOWN STRL REUS W/TWL XL LVL3 (GOWN DISPOSABLE) ×2
GUIDEWIRE CNFDA BRKR CVD (WIRE) ×5 IMPLANT
GUIDEWIRE SAF TJ AMPL .035X180 (WIRE) ×5 IMPLANT
GUIDEWIRE SAFE TJ AMPLATZ EXST (WIRE) ×10 IMPLANT
GUIDEWIRE STRAIGHT .035 260CM (WIRE) ×5 IMPLANT
INSERT FOGARTY SM (MISCELLANEOUS) IMPLANT
KIT BASIN OR (CUSTOM PROCEDURE TRAY) ×5 IMPLANT
KIT DILATOR VASC 18G NDL (KITS) IMPLANT
KIT HEART LEFT (KITS) ×5 IMPLANT
KIT SUCTION CATH 14FR (SUCTIONS) ×10 IMPLANT
KIT TURNOVER KIT B (KITS) ×5 IMPLANT
LOOP VESSEL MAXI BLUE (MISCELLANEOUS) IMPLANT
LOOP VESSEL MINI RED (MISCELLANEOUS) IMPLANT
NEEDLE PERC 18GX7CM (NEEDLE) ×5 IMPLANT
NS IRRIG 1000ML POUR BTL (IV SOLUTION) ×5 IMPLANT
PACK ENDOVASCULAR (PACKS) ×5 IMPLANT
PAD ARMBOARD 7.5X6 YLW CONV (MISCELLANEOUS) ×10 IMPLANT
PAD ELECT DEFIB RADIOL ZOLL (MISCELLANEOUS) ×5 IMPLANT
PENCIL BUTTON HOLSTER BLD 10FT (ELECTRODE) IMPLANT
PERCLOSE PROGLIDE 6F (VASCULAR PRODUCTS) ×10
SET MICROPUNCTURE 5F STIFF (MISCELLANEOUS) ×5 IMPLANT
SHEATH BRITE TIP 6FR 35CM (SHEATH) ×5 IMPLANT
SHEATH PINNACLE 6F 10CM (SHEATH) IMPLANT
SHEATH PINNACLE 8F 10CM (SHEATH) ×5 IMPLANT
SLEEVE REPOSITIONING LENGTH 30 (MISCELLANEOUS) ×5 IMPLANT
SPONGE LAP 4X18 RFD (DISPOSABLE) ×5 IMPLANT
STOPCOCK MORSE 400PSI 3WAY (MISCELLANEOUS) ×10 IMPLANT
SUT ETHIBOND X763 2 0 SH 1 (SUTURE) IMPLANT
SUT GORETEX CV 4 TH 22 36 (SUTURE) IMPLANT
SUT GORETEX CV4 TH-18 (SUTURE) IMPLANT
SUT MNCRL AB 3-0 PS2 18 (SUTURE) IMPLANT
SUT PROLENE 5 0 C 1 36 (SUTURE) IMPLANT
SUT PROLENE 6 0 C 1 30 (SUTURE) IMPLANT
SUT SILK  1 MH (SUTURE) ×2
SUT SILK 1 MH (SUTURE) ×3 IMPLANT
SUT VIC AB 2-0 CT1 27 (SUTURE)
SUT VIC AB 2-0 CT1 TAPERPNT 27 (SUTURE) IMPLANT
SUT VIC AB 2-0 CTX 36 (SUTURE) IMPLANT
SUT VIC AB 3-0 SH 8-18 (SUTURE) IMPLANT
SYR 50ML LL SCALE MARK (SYRINGE) ×5 IMPLANT
SYR BULB IRRIGATION 50ML (SYRINGE) IMPLANT
SYR CONTROL 10ML LL (SYRINGE) IMPLANT
TAPE CLOTH SURG 4X10 WHT LF (GAUZE/BANDAGES/DRESSINGS) ×5 IMPLANT
TOWEL GREEN STERILE (TOWEL DISPOSABLE) ×10 IMPLANT
TRANSDUCER W/STOPCOCK (MISCELLANEOUS) ×10 IMPLANT
TRAY FOLEY SLVR 14FR TEMP STAT (SET/KITS/TRAYS/PACK) IMPLANT
TUBE SUCT INTRACARD DLP 20F (MISCELLANEOUS) IMPLANT
VALVE HEART TRANSCATH SZ3 29MM (Prosthesis & Implant Heart) ×5 IMPLANT
WIRE .035 3MM-J 145CM (WIRE) ×5 IMPLANT
WIRE BENTSON .035X145CM (WIRE) ×5 IMPLANT
WIRE EMERALD 3MM-J .035X260CM (WIRE) ×5 IMPLANT
WIRE LUNDERQUIST .035X180CM (WIRE) ×5 IMPLANT

## 2018-09-14 NOTE — Progress Notes (Signed)
Site area: rt radial arterial line Site Prior to Removal:  Level 0 Pressure Applied For: 10 minutes Manual:   yes Patient Status During Pull:  stable Post Pull Site:  Level  0 Post Pull Instructions Given:  yes Post Pull Pulses Present: rt radial palpable Dressing Applied:  Gauze and tegaderm Bedrest begins @  Comments:   

## 2018-09-14 NOTE — Op Note (Addendum)
[facility] VASC[facility]ENTER   MULTIDISCIPLINARY HEART VALVE TEAM   TAVR OPERATIVE NOTE   Date of Procedure:  09/14/2018  Preoperative Diagnosis: Severe Aortic Stenosis   Postoperative Diagnosis: Same   Procedure:    Transcatheter Aortic Valve Replacement - Percutaneous Right Transfemoral Approach  Edwards Sapien 3 THV (size 29 mm, model # 9600TFX, serial # [id number])   Co-Surgeons:  Sherren Mocha, MD and Valentina Gu. 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  Patient's cardiac history dates back to the early 1990s when he reportedly suffered an acute myocardial infarction.  He reportedly underwent cardiac catheterization at that time and was found to have normal coronary arteries.  He developed persistent atrial fibrillation and has remained on long-term warfarin anticoagulation ever since.  He was followed for many years by Dr. Velora Heckler and more recently has been followed by Dr. Meda Coffee.  He has had problems with chronic lower extremity edema.  Echocardiograms have  demonstrated the presence of low normal left ventricular systolic function with aortic stenosis that has gradually progressed in severity.  Echocardiogram performed March 02, 2018 revealed severe aortic stenosis.  Peak velocity across aortic valve measured 4.4 m/s corresponding to mean transvalvular gradient estimated 43 mmHg.  The DVI was reportedly only 0.18.  Left ventricular systolic function remains stable with ejection fraction estimated 50 to 55%.  In May 2019 he was evaluated in the emergency department with a brief episode of chest pain relieved by nitroglycerin.  Troponins were negative.  He was seen in follow-up by Truitt Merle in July and subsequently the patient was referred to the multidisciplinary heart valve clinic for evaluation of his aortic stenosis.  He was seen in consultation by Dr. Burt Knack on July 15, 2018 and diagnostic cardiac catheterization was performed July 21, 2018.  Catheterization revealed coronary artery disease with 70% proximal stenosis of the left anterior descending coronary artery and otherwise moderate nonobstructive disease.  Right heart pressures and cardiac output were normal.  CT angiography was performed and the patient has been referred for surgical consultation.  During the course of the patient's preoperative work up they have been evaluated comprehensively by a multidisciplinary team of specialists coordinated through the [facility] in the [facility].  They have been demonstrated to suffer from symptomatic severe aortic stenosis as noted above. The patient has been counseled extensively as to the relative risks and benefits of all options for the treatment of severe aortic stenosis including long term medical therapy, conventional surgery for aortic valve replacement, and transcatheter aortic valve replacement.  All questions have been answered, and the patient provides full informed consent for the operation as  described.   DETAILS OF THE OPERATIVE PROCEDURE  PREPARATION:    The patient is brought  to the operating room on the above mentioned date and central monitoring was established by the anesthesia team including placement of a central venous line and radial arterial line. The patient is placed in the supine position on the operating table.  Intravenous antibiotics are administered. The patient is monitored closely throughout the procedure under conscious sedation.  Baseline transthoracic echocardiogram was performed. The patient's chest, abdomen, both groins, and both lower extremities are prepared and draped in a sterile manner. A time out procedure is performed.   PERIPHERAL ACCESS:    Using the modified Seldinger technique, femoral arterial and venous access was obtained with placement of 6 Fr sheaths on the left side.  A pigtail diagnostic catheter was passed through the left arterial sheath under fluoroscopic guidance into the aortic root.  A temporary transvenous pacemaker catheter was passed through the left femoral venous sheath under fluoroscopic guidance into the right ventricle.  The pacemaker was tested to ensure stable lead placement and pacemaker capture. Aortic root angiography was performed in order to determine the optimal angiographic angle for valve deployment.   TRANSFEMORAL ACCESS:   Percutaneous transfemoral access and sheath placement was performed by Dr. Burt Knack using ultrasound guidance.  The right common femoral artery was cannulated using a micropuncture needle and appropriate location was verified using hand injection angiogram.  A pair of Abbott Perclose percutaneous closure devices were placed and a 6 French sheath replaced into the femoral artery.  The patient was heparinized systemically and ACT verified > 250 seconds.    A 16 Fr transfemoral E-sheath was introduced into the right common femoral artery after progressively dilating over a Lunderquist wire. An AL-1  catheter was used to direct a straight-tip exchange length wire across the native aortic valve into the left ventricle. This was exchanged out for a pigtail catheter and position was confirmed in the LV apex. Simultaneous LV and Ao pressures were recorded.  The pigtail catheter was exchanged for Confida wire in the LV apex.  Echocardiography was utilized to confirm appropriate wire position and no sign of entanglement in the mitral subvalvular apparatus.   TRANSCATHETER HEART VALVE DEPLOYMENT:   An Edwards Sapien 3 transcatheter heart valve (size 29 mm, model #9600TFX, serial #[id number]) was prepared and crimped per manufacturer's guidelines, and the proper orientation of the valve is confirmed on the [employer] delivery system. The valve was advanced through the introducer sheath using normal technique until in an appropriate position in the abdominal aorta beyond the sheath tip. The balloon was then retracted and using the fine-tuning wheel was centered on the valve. The valve was then advanced across the aortic arch using appropriate flexion of the catheter. The valve was carefully positioned across the aortic valve annulus. The Commander catheter was retracted using normal technique. Once final position of the valve has been confirmed by angiographic assessment, the valve is deployed while temporarily holding ventilation and during rapid ventricular pacing to maintain systolic blood pressure < 50 mmHg and pulse pressure < 10 mmHg. The balloon inflation is held for >3 seconds after reaching full deployment volume. Once the balloon has fully deflated the balloon is retracted into the ascending aorta and valve function is assessed using echocardiography. There is felt to be no paravalvular leak and no central aortic insufficiency.  The patient's hemodynamic recovery following valve deployment is good.  The deployment balloon and guidewire are both removed.  The Confida wire is removed through a pigtail  catheter.   PROCEDURE COMPLETION:   The sheath was  removed and femoral artery closure performed by Dr Burt Knack.  Protamine was administered once femoral arterial repair was complete. The temporary pacemaker, pigtail catheters and femoral sheaths were removed with manual pressure used for hemostasis.   The patient tolerated the procedure well and is transported to the surgical intensive care in stable condition. There were no immediate intraoperative complications. All sponge instrument and needle counts are verified correct at completion of the operation.   No blood products were administered during the operation.  The patient received a total of 54 mL of intravenous contrast during the procedure.   Rexene Alberts, MD 09/14/2018 9:47 AM

## 2018-09-14 NOTE — Transfer of Care (Signed)
Immediate Anesthesia Transfer of Care Note  Patient: SIMON LLAMAS  Procedure(s) Performed: TRANSCATHETER AORTIC VALVE REPLACEMENT, TRANSFEMORAL. 10m EDWARDS SAPIEN3 THV. (N/A Chest) INTRAOPERATIVE TRANSTHORACIC ECHOCARDIOGRAM (Chest)  Patient Location: PACU  Anesthesia Type:MAC  Level of Consciousness: awake and drowsy  Airway & Oxygen Therapy: Patient Spontanous Breathing and Patient connected to nasal cannula oxygen  Post-op Assessment: Report given to RN and Post -op Vital signs reviewed and stable  Post vital signs: Reviewed and stable  Last Vitals:  Vitals Value Taken Time  BP 96/35 09/14/2018  9:53 AM  Temp    Pulse 42 09/14/2018  9:57 AM  Resp 0 09/14/2018  9:57 AM  SpO2 99 % 09/14/2018  9:57 AM  Vitals shown include unvalidated device data.  Last Pain:  Vitals:   09/14/18 0605  TempSrc: Oral  PainSc: 0-No pain         Complications: No apparent anesthesia complications

## 2018-09-14 NOTE — Anesthesia Procedure Notes (Signed)
Central Venous Catheter Insertion Performed by: Albertha Ghee, MD, anesthesiologist Start/End10/22/2019 7:05 AM, 09/14/2018 7:25 AM Patient location: Pre-op. Preanesthetic checklist: patient identified, IV checked, site marked, risks and benefits discussed, surgical consent, monitors and equipment checked, pre-op evaluation, timeout performed and anesthesia consent Position: Trendelenburg Lidocaine 1% used for infiltration and patient sedated Hand hygiene performed , maximum sterile barriers used  and Seldinger technique used Catheter size: 7 Fr Central line was placed.Double lumen Procedure performed using ultrasound guided technique. Ultrasound Notes:anatomy identified, needle tip was noted to be adjacent to the nerve/plexus identified and no ultrasound evidence of intravascular and/or intraneural injection Attempts: 1 Following insertion, line sutured, dressing applied and Biopatch. Post procedure assessment: blood return through all ports, free fluid flow and no air  Patient tolerated the procedure well with no immediate complications.

## 2018-09-14 NOTE — Progress Notes (Signed)
Patient interviewed in the preop area. Patient able to state name, DOB, procedure, allergies, npo status, no metal in body and no pain at this time. CRNA at bedside.   Leatha Gilding, RN

## 2018-09-14 NOTE — Anesthesia Postprocedure Evaluation (Signed)
Anesthesia Post Note  Patient: [name] [name]  Procedure(s) Performed: TRANSCATHETER AORTIC VALVE REPLACEMENT, TRANSFEMORAL. 54m EDWARDS SAPIEN3 THV. (N/A Chest) INTRAOPERATIVE TRANSTHORACIC ECHOCARDIOGRAM (Chest)     Patient location during evaluation: PACU Anesthesia Type: MAC Level of consciousness: awake and alert Pain management: pain level controlled Vital Signs Assessment: post-procedure vital signs reviewed and stable Respiratory status: spontaneous breathing, nonlabored ventilation, respiratory function stable and patient connected to nasal cannula oxygen Cardiovascular status: stable and blood pressure returned to baseline Postop Assessment: no apparent nausea or vomiting Anesthetic complications: no    Last Vitals:  Vitals:   09/14/18 1310 09/14/18 1325  BP: (!) 105/45 (!) 108/55  Pulse: (!) 51 (!) 48  Resp: 19 19  Temp:    SpO2: 95% 98%    Last Pain:  Vitals:   09/14/18 1024  TempSrc: Tympanic  PainSc: 0-No pain                 Korrine Sicard S

## 2018-09-14 NOTE — Anesthesia Procedure Notes (Signed)
Arterial Line Insertion Start/End10/22/2019 7:00 AM, 09/14/2018 7:14 AM Performed by: Inda Coke, CRNA, CRNA  Preanesthetic checklist: patient identified, IV checked, site marked, risks and benefits discussed, surgical consent, monitors and equipment checked, pre-op evaluation, timeout performed and anesthesia consent Lidocaine 1% used for infiltration Right, radial was placed Catheter size: 20 G Hand hygiene performed  and maximum sterile barriers used  Allen's test indicative of satisfactory collateral circulation Attempts: 2 Procedure performed without using ultrasound guided technique. Ultrasound Notes:anatomy identified Following insertion, dressing applied and Biopatch. Post procedure assessment: normal  Patient tolerated the procedure well with no immediate complications.

## 2018-09-14 NOTE — Progress Notes (Signed)
Pt arrived to 4e from [facility] cath lab. Pt oriented to room and staff. Vitals obtained. Telemetry box applied and CCMD notified x2. Bilateral groin sites level 0. CHG bath completed. Pt denies needs. Family at bedside and updated on plan of care. Will continue to monitor closely.   Ara Kussmaul BSN, RN

## 2018-09-14 NOTE — Progress Notes (Signed)
Pt ambulated about 150 ft in hallway. Pt used rolling walker and only required standby assist. Pt with very slow pace, but able to tolerate ambulation well. Pt had few PVCs, but otherwise no changes. Returned to bed. Will continue to monitor.   Ara Kussmaul BSN, RN

## 2018-09-14 NOTE — Progress Notes (Addendum)
Site area: left groin fa and fv sheaths pulled and pressure held by Jasmine Awe Site Prior to Removal:  Level 0 Pressure Applied For: 20 minutes Manual:   yes Patient Status During Pull:  stable Post Pull Site:  Level 0 Post Pull Instructions Given:  yes Post Pull Pulses Present: left dp dopplered, rt dp palpable Dressing Applied:  Gauze and tegaderm Bedrest begins @ 7544 Comments:

## 2018-09-14 NOTE — Progress Notes (Addendum)
Receiving 500cc NS fluid bolus. Patient awake and talking to daughters. Bilateral groins level 0. HR remains 30's to 50's, a fib and  SBP remains 80's on 93mcg levophed; to stay on 23mcg per [facility].

## 2018-09-14 NOTE — Op Note (Signed)
HEART AND VASCULAR CENTER   MULTIDISCIPLINARY HEART VALVE TEAM   TAVR OPERATIVE NOTE   Date of Procedure:  09/14/2018  Preoperative Diagnosis: Severe Aortic Stenosis   Postoperative Diagnosis: Same   Procedure:    Transcatheter Aortic Valve Replacement - Percutaneous  Transfemoral Approach  Edwards Sapien 3 THV (size 29 mm, model # 9600TFX, serial # [id number])   Co-Surgeons:  Valentina Gu. Roxy Manns, MD and Sherren Mocha, MD  Anesthesiologist:  Albertha Ghee, MD  Echocardiographer:  Sanda Klein, MD  Pre-operative Echo Findings:  Severe aortic stenosis  Normal left ventricular systolic function  Post-operative Echo Findings:  No paravalvular leak  Normal/unchanged left ventricular systolic function  BRIEF CLINICAL NOTE AND INDICATIONS FOR SURGERY  Please see the complete note of Dr. Roxy Manns for full indications and clinical history.  Following the decision to proceed with transcatheter aortic valve replacement, a discussion has been held regarding what types of management strategies would be attempted intraoperatively in the event of life-threatening complications, including whether or not the patient would be considered a candidate for the use of cardiopulmonary bypass and/or conversion to open sternotomy for attempted surgical intervention.  The patient has been advised of a variety of complications that might develop peculiar to this approach including but not limited to risks of death, stroke, paravalvular leak, aortic dissection or other major vascular complications, aortic annulus rupture, device embolization, cardiac rupture or perforation, acute myocardial infarction, arrhythmia, heart block or bradycardia requiring permanent pacemaker placement, congestive heart failure, respiratory failure, renal failure, pneumonia, infection, other late complications related to structural valve deterioration or migration, or other complications that might ultimately cause a temporary or  permanent loss of functional independence or other long term morbidity.  The patient provides full informed consent for the procedure as described and all questions were answered preoperatively.  DETAILS OF THE OPERATIVE PROCEDURE  PREPARATION:   The patient is brought to the operating room on the above mentioned date and central monitoring was established by the anesthesia team including placement of a central venous catheter and radial arterial line. The patient is placed in the supine position on the operating table.  Intravenous antibiotics are administered. The patient is monitored closely throughout the procedure under conscious sedation.  Baseline transthoracic echocardiogram is performed. The patient's chest, abdomen, both groins, and both lower extremities are prepared and draped in a sterile manner. A time out procedure is performed.   PERIPHERAL ACCESS:   Using ultrasound guidance, femoral arterial and venous access is obtained with placement of 6 Fr sheaths on the left side.  A pigtail diagnostic catheter was passed through the femoral arterial sheath under fluoroscopic guidance into the aortic root.  A temporary transvenous pacemaker catheter was passed through the femoral venous sheath under fluoroscopic guidance into the right ventricle.  The pacemaker was tested to ensure stable lead placement and pacemaker capture. Aortic root angiography was performed in order to determine the optimal angiographic angle for valve deployment.  TRANSFEMORAL ACCESS:  A micropuncture technique is used to access the right femoral artery under fluoroscopic and ultrasound guidance.  2 Perclose devices are deployed at 10' and 2' positions to 'PreClose' the femoral artery. An 8 French sheath is placed and then a Lunderquist wire is advanced through the sheath. This is changed out for a 16 French transfemoral E-Sheath after progressively dilating over the Superstiff wire.  An AL-2 catheter was used to direct a  straight-tip exchange length wire across the native aortic valve into the left ventricle. This was exchanged  out for a pigtail catheter and position was confirmed in the LV apex. Simultaneous LV and Ao pressures were recorded.  The pigtail catheter was exchanged for a Confida wire in the LV apex  BALLOON AORTIC VALVULOPLASTY:  Not performed  TRANSCATHETER HEART VALVE DEPLOYMENT:  An Edwards Sapien 3 transcatheter heart valve (size 29 mm, model #9600TFX, serial #[id number]) was prepared and crimped per manufacturer's guidelines, and the proper orientation of the valve is confirmed on the [employer] delivery system. The valve was advanced through the introducer sheath using normal technique until in an appropriate position in the abdominal aorta beyond the sheath tip. The balloon was then retracted and using the fine-tuning wheel was centered on the valve. The valve was then advanced across the aortic arch using appropriate flexion of the catheter. The valve was carefully positioned across the aortic valve annulus. The Commander catheter was retracted using normal technique. Once final position of the valve has been confirmed by angiographic assessment, the valve is deployed while temporarily holding ventilation and during rapid ventricular pacing to maintain systolic blood pressure < 50 mmHg and pulse pressure < 10 mmHg. The balloon inflation is held for >3 seconds after reaching full deployment volume. Once the balloon has fully deflated the balloon is retracted into the ascending aorta and valve function is assessed using echocardiography. There is felt to be no paravalvular leak and no central aortic insufficiency.  The patient's hemodynamic recovery following valve deployment is good.  The deployment balloon and guidewire are both removed. Echo demostrated acceptable post-procedural gradients, stable mitral valve function, and no aortic insufficiency.    PROCEDURE COMPLETION:  The sheath was removed  and femoral artery closure is performed using the 2 previously deployed Perclose devices.  Protamine is administered once femoral arterial repair was complete. The site is clear with no evidence of bleeding or hematoma after the sutures are tightened. The temporary pacemaker, pigtail catheters and femoral sheaths were removed with manual pressure used for hemostasis.   The patient tolerated the procedure well and is transported to the surgical intensive care in stable condition. There were no immediate intraoperative complications. All sponge instrument and needle counts are verified correct at completion of the operation.   The patient received a total of 54 mL of intravenous contrast during the procedure.   Sherren Mocha, MD 09/14/2018 9:53 AM

## 2018-09-14 NOTE — Anesthesia Preprocedure Evaluation (Addendum)
Anesthesia Evaluation  Patient identified by MRN, date of birth, ID band Patient awake    Reviewed: Allergy & Precautions, H&P , NPO status , Patient's Chart, lab work & pertinent test results  Airway Mallampati: II   Neck ROM: full    Dental   Pulmonary asthma , COPD, former smoker,    breath sounds clear to auscultation       Cardiovascular hypertension, + CAD and +CHF  + Valvular Problems/Murmurs AS  Rhythm:regular Rate:Normal  Cath (07/21/18): Non-obstructive CAD.  Aortic valve mean gradient 43.    Neuro/Psych CVA    GI/Hepatic GERD  ,  Endo/Other  Hypothyroidism   Renal/GU      Musculoskeletal  (+) Arthritis ,   Abdominal   Peds  Hematology   Anesthesia Other Findings   Reproductive/Obstetrics                            Anesthesia Physical Anesthesia Plan  ASA: III  Anesthesia Plan: MAC   Post-op Pain Management:    Induction: Intravenous  PONV Risk Score and Plan: 1 and Ondansetron, Propofol infusion and Treatment may vary due to age or medical condition  Airway Management Planned: Simple Face Mask  Additional Equipment:   Intra-op Plan:   Post-operative Plan:   Informed Consent: I have reviewed the patients History and Physical, chart, labs and discussed the procedure including the risks, benefits and alternatives for the proposed anesthesia with the patient or authorized representative who has indicated his/her understanding and acceptance.     Plan Discussed with: CRNA, Anesthesiologist and Surgeon  Anesthesia Plan Comments:         Anesthesia Quick Evaluation

## 2018-09-14 NOTE — Interval H&P Note (Signed)
History and Physical Interval Note:  09/14/2018 6:32 AM  [name] [name]  has presented today for surgery, with the diagnosis of Severe Aortic Stenosis  The various methods of treatment have been discussed with the patient and family. After consideration of risks, benefits and other options for treatment, the patient has consented to  Procedure(s): TRANSCATHETER AORTIC VALVE REPLACEMENT, TRANSFEMORAL (N/A) TRANSESOPHAGEAL ECHOCARDIOGRAM (TEE) (N/A) as a surgical intervention .  The patient's history has been reviewed, patient examined, no change in status, stable for surgery.  I have reviewed the patient's chart and labs.  Questions were answered to the patient's satisfaction.     Rexene Alberts

## 2018-09-14 NOTE — Progress Notes (Signed)
Echocardiogram 2D Echocardiogram has been performed.  Matilde Bash 09/14/2018, 9:37 AM

## 2018-09-14 NOTE — Progress Notes (Signed)
  [place] VALVE TEAM  Patient doing well s/p TAVR. He is hemodynamically stable but still on 2 mcgs of Levophed. Groin sites stable. ECG with slow afib but no high grade block. K is 3.1 and this is being repleted. H/H 7.8/23 (down from 8.5/25) post operatively. Will continue to monitor closely and transfuse if indicated. When off pressors plan to discontinue arterial line and transfer up to 4E. Plan for early ambulation after bedrest completed and hopeful discharge over the next 24-48 hours.   Angelena Form PA-C  [facility]  Pager [phone]

## 2018-09-14 NOTE — Progress Notes (Signed)
H and H drawn and sent to lab.

## 2018-09-15 ENCOUNTER — Other Ambulatory Visit: Payer: Self-pay | Admitting: Physician Assistant

## 2018-09-15 ENCOUNTER — Inpatient Hospital Stay (HOSPITAL_COMMUNITY): Payer: Medicare Other

## 2018-09-15 ENCOUNTER — Encounter (HOSPITAL_COMMUNITY): Payer: Self-pay | Admitting: Cardiovascular Disease

## 2018-09-15 DIAGNOSIS — I350 Nonrheumatic aortic (valve) stenosis: Principal | ICD-10-CM

## 2018-09-15 DIAGNOSIS — Z952 Presence of prosthetic heart valve: Secondary | ICD-10-CM

## 2018-09-15 DIAGNOSIS — I340 Nonrheumatic mitral (valve) insufficiency: Secondary | ICD-10-CM

## 2018-09-15 LAB — ECHOCARDIOGRAM LIMITED
Height: 71.5 in
Weight: 3015.89 oz

## 2018-09-15 LAB — BASIC METABOLIC PANEL
ANION GAP: 6 (ref 5–15)
BUN: 20 mg/dL (ref 8–23)
CALCIUM: 8 mg/dL — AB (ref 8.9–10.3)
CHLORIDE: 100 mmol/L (ref 98–111)
CO2: 29 mmol/L (ref 22–32)
CREATININE: 1.1 mg/dL (ref 0.61–1.24)
GFR calc non Af Amer: 57 mL/min — ABNORMAL LOW (ref >60)
Glucose, Bld: 104 mg/dL — ABNORMAL HIGH (ref 70–99)
Potassium: 3.1 mmol/L — ABNORMAL LOW (ref 3.5–5.1)
Sodium: 135 mmol/L (ref 135–145)

## 2018-09-15 LAB — CBC
HCT: 26.2 % — ABNORMAL LOW (ref 39.0–52.0)
HEMOGLOBIN: 8.1 g/dL — AB (ref 13.0–17.0)
MCH: 29.3 pg (ref 26.0–34.0)
MCHC: 30.9 g/dL (ref 30.0–36.0)
MCV: 94.9 fL (ref 80.0–100.0)
NRBC: 0 % (ref 0.0–0.2)
Platelets: 149 10*3/uL — ABNORMAL LOW (ref 150–400)
RBC: 2.76 MIL/uL — ABNORMAL LOW (ref 4.22–5.81)
RDW: 16.8 % — AB (ref 11.5–15.5)
WBC: 8.7 10*3/uL (ref 4.0–10.5)

## 2018-09-15 LAB — MAGNESIUM: Magnesium: 1.8 mg/dL (ref 1.7–2.4)

## 2018-09-15 MED ORDER — FUROSEMIDE 40 MG PO TABS
40.0000 mg | ORAL_TABLET | Freq: Every day | ORAL | Status: DC
  Administered 2018-09-15 – 2018-09-17 (×3): 40 mg via ORAL
  Filled 2018-09-15 (×3): qty 1

## 2018-09-15 MED ORDER — METOPROLOL SUCCINATE ER 25 MG PO TB24
12.5000 mg | ORAL_TABLET | Freq: Every day | ORAL | Status: DC
  Administered 2018-09-15 – 2018-09-17 (×3): 12.5 mg via ORAL
  Filled 2018-09-15 (×3): qty 1

## 2018-09-15 MED ORDER — WARFARIN SODIUM 5 MG PO TABS
5.0000 mg | ORAL_TABLET | Freq: Once | ORAL | Status: AC
  Administered 2018-09-15: 5 mg via ORAL
  Filled 2018-09-15: qty 1

## 2018-09-15 MED ORDER — POTASSIUM CHLORIDE 20 MEQ PO PACK
80.0000 meq | PACK | Freq: Once | ORAL | Status: AC
  Administered 2018-09-15: 80 meq via ORAL
  Filled 2018-09-15: qty 4

## 2018-09-15 MED ORDER — WARFARIN - PHARMACIST DOSING INPATIENT
Freq: Every day | Status: DC
  Administered 2018-09-17: 17:00:00

## 2018-09-15 MED FILL — Cefuroxime Sodium For Inj 750 MG: INTRAMUSCULAR | Qty: 750 | Status: CN

## 2018-09-15 MED FILL — Sodium Chloride IV Soln 0.9%: INTRAVENOUS | Qty: 100 | Status: AC

## 2018-09-15 MED FILL — Potassium Chloride Inj 2 mEq/ML: INTRAVENOUS | Qty: 40 | Status: AC

## 2018-09-15 MED FILL — Insulin Regular (Human) Inj 100 Unit/ML: INTRAMUSCULAR | Qty: 1 | Status: CN

## 2018-09-15 MED FILL — Magnesium Sulfate Inj 50%: INTRAMUSCULAR | Qty: 10 | Status: AC

## 2018-09-15 MED FILL — Heparin Sodium (Porcine) Inj 1000 Unit/ML: INTRAMUSCULAR | Qty: 30 | Status: AC

## 2018-09-15 MED FILL — Sodium Chloride IV Soln 0.9%: INTRAVENOUS | Qty: 250 | Status: AC

## 2018-09-15 MED FILL — Phenylephrine HCl IV Soln 10 MG/ML: INTRAVENOUS | Qty: 2 | Status: AC

## 2018-09-15 NOTE — Evaluation (Signed)
Physical Therapy Evaluation Patient Details Name: [name] [name] MRN: [id number] DOB: December 16, 1924 Today's Date: 09/15/2018   History of Present Illness  Pt is a 84 y.o. male admitted 09/14/18 for scheduled TAVR. PMH includes HTN, CAD, CHF, a-fib, RA, CVA, DDD, toe amputation.    Clinical Impression  Pt presents with an overall decrease in functional mobility secondary to above. PTA, pt mod indep with RW/walking stick and lives alone; daughters live nearby and provide transportation. Today, pt required modA to stand and close min guard for slowed amb. Demonstrates generalized weakness, decreased activity tolerance, and decreased attention. Pt would benefit from continued acute PT services to maximize functional mobility and independence prior to d/c with SNF-level therapies.     Follow Up Recommendations SNF;Supervision for mobility/OOB    Equipment Recommendations  None recommended by PT    Recommendations for Other Services       Precautions / Restrictions Precautions Precautions: Fall Restrictions Weight Bearing Restrictions: No      Mobility  Bed Mobility Overal bed mobility: Needs Assistance Bed Mobility: Sit to Supine       Sit to supine: Min assist   General bed mobility comments: MinA to assist BLEs into bed. Significant increased time and effort when attempting to scoot towards edge of chair in preparation to stand  Transfers Overall transfer level: Needs assistance Equipment used: Rolling walker (2 wheeled) Transfers: Sit to/from Stand Sit to Stand: Mod assist         General transfer comment: ModA to assist trunk elevation; poor eccentric control into sitting  Ambulation/Gait Ambulation/Gait assistance: Min guard Gait Distance (Feet): 5 Feet Assistive device: Rolling walker (2 wheeled) Gait Pattern/deviations: Shuffle;Trunk flexed Gait velocity: Decreased Gait velocity interpretation: <1.31 ft/sec, indicative of household ambulator General Gait  Details: Significantly slowed ambulating, shuffling steps with RW and min guard for balance; pt required cues to stay on task as he stops moving everytime he talks  Financial trader Rankin (Stroke Patients Only)       Balance Overall balance assessment: Needs assistance   Sitting balance-Leahy Scale: Fair       Standing balance-Leahy Scale: Poor Standing balance comment: Reliant on UE support                             Pertinent Vitals/Pain Pain Assessment: Faces Faces Pain Scale: Hurts a little bit Pain Location: Knees Pain Descriptors / Indicators: Sore Pain Intervention(s): Monitored during session    Home Living Family/patient expects to be discharged to:: Private residence Living Arrangements: Alone Available Help at Discharge: Family;Available PRN/intermittently Type of Home: House Home Access: Ramped entrance     Home Layout: One level Home Equipment: [place] - 4 wheels;Other (comment)(walking stick) Additional Comments: Daughters live nearby    Prior Function Level of Independence: Needs assistance   Gait / Transfers Assistance Needed: Mod indep with use of RW or walking stick. Does not drive; daughters live nearby and assist as needed           Hand Dominance        Extremity/Trunk Assessment   Upper Extremity Assessment Upper Extremity Assessment: Generalized weakness    Lower Extremity Assessment Lower Extremity Assessment: Generalized weakness    Cervical / Trunk Assessment Cervical / Trunk Assessment: Kyphotic  Communication   Communication: No difficulties  Cognition Arousal/Alertness: Awake/alert Behavior During Therapy: WFL for tasks assessed/performed  Overall Cognitive Status: Impaired/Different from baseline Area of Impairment: Attention;Following commands;Safety/judgement;Problem solving                   Current Attention Level: Selective   Following Commands:  Follows multi-step commands with increased time Safety/Judgement: Decreased awareness of safety;Decreased awareness of deficits   Problem Solving: Requires verbal cues General Comments: Likely baseline cognition. Difficulty multitasking (i.e. stopping mobility to talk)      General Comments General comments (skin integrity, edema, etc.): Further mobility limited by echo present    Exercises     Assessment/Plan    PT Assessment Patient needs continued PT services  PT Problem List Decreased strength;Decreased activity tolerance;Decreased balance;Decreased mobility;Cardiopulmonary status limiting activity       PT Treatment Interventions DME instruction;Gait training;Stair training;Functional mobility training;Therapeutic activities;Therapeutic exercise;Balance training;Patient/family education    PT Goals (Current goals can be found in the Care Plan section)  Acute Rehab PT Goals Patient Stated Goal: Get stronger at rehab before returning home PT Goal Formulation: With patient Time For Goal Achievement: 09/29/18 Potential to Achieve Goals: Good    Frequency Min 2X/week   Barriers to discharge Decreased caregiver support      Co-evaluation               AM-PAC PT "6 Clicks" Daily Activity  Outcome Measure Difficulty turning over in bed (including adjusting bedclothes, sheets and blankets)?: Unable Difficulty moving from lying on back to sitting on the side of the bed? : Unable Difficulty sitting down on and standing up from a chair with arms (e.g., wheelchair, bedside commode, etc,.)?: Unable Help needed moving to and from a bed to chair (including a wheelchair)?: A Little Help needed walking in hospital room?: A Little Help needed climbing 3-5 steps with a railing? : A Lot 6 Click Score: 11    End of Session Equipment Utilized During Treatment: Gait belt Activity Tolerance: Patient tolerated treatment well Patient left: in bed;with call bell/phone within  reach;with bed alarm set;Other (comment)(with Echo) Nurse Communication: Mobility status PT Visit Diagnosis: Other abnormalities of gait and mobility (R26.89);Muscle weakness (generalized) (M62.81)    Time: 7614-7092 PT Time Calculation (min) (ACUTE ONLY): 12 min   Charges:   PT Evaluation $PT Eval Moderate Complexity: 1 Mod         Mabeline Caras, PT, DPT Acute Rehabilitation Services  Pager [phone] Office [phone]  Derry Lory 09/15/2018, 10:23 AM

## 2018-09-15 NOTE — Research (Signed)
ENVISAGE Informed Consent   Subject Name: [name] [name]  Subject met inclusion and exclusion criteria.  The informed consent form, study requirements and expectations were reviewed with the subject and questions and concerns were addressed prior to the signing of the consent form.  The subject verbalized understanding of the trail requirements.  The subject agreed to participate in the ENVISAGE trial and signed the informed consent.  The informed consent was obtained prior to performance of any protocol-specific procedures for the subject.  A copy of the signed informed consent was given to the subject and a copy was placed in the subject's medical record.  Philemon Kingdom D 09/15/2018, 1400pm

## 2018-09-15 NOTE — Research (Signed)
Spoke with patient and daughter about the ENVISAGE research study. Answered all questions. Left consent with patient and daughter to review, told them I would come back in the am.

## 2018-09-15 NOTE — Progress Notes (Addendum)
[name] [name] VALVE TEAM  Patient Name: [name] [name] Date of Encounter: 09/15/2018  Primary Cardiologist: Dr. Meda Coffee / Dr. Burt Knack & Dr. Roxy Manns (TAVR)  Hospital Problem List     Principal Problem:   S/P TAVR (transcatheter aortic valve replacement) Active Problems:   HLD (hyperlipidemia)   Benign essential HTN   Atrial fibrillation (HCC)   COPD with asthma ([place])   Hypothyroidism   Chronic systolic CHF (congestive heart failure), NYHA class 2 (HCC)   Bilateral carotid artery disease (HCC)   Chronic anticoagulation   Severe aortic stenosis   Iron deficiency anemia    Subjective   Feeling great. Has been up walking and feels okay. Can't tell a big difference in his breathing but overall feels good. Likes being at hospital getting all this attention. Wants to see if he can go to [facility] at discharge.  Inpatient Medications    Scheduled Meds: . aspirin  81 mg Oral QHS  . atorvastatin  10 mg Oral QHS  . folic acid  1 mg Oral Daily  . Influenza vac split quadrivalent PF  0.5 mL Intramuscular Tomorrow-1000  . sodium chloride flush  3 mL Intravenous Q12H  . tamsulosin  0.4 mg Oral QPM   Continuous Infusions: . sodium chloride    . cefUROXime (ZINACEF)  IV 200 mL/hr at 09/15/18 0700  . nitroGLYCERIN    . phenylephrine (NEO-SYNEPHRINE) Adult infusion    . potassium chloride 50 mL/hr at 09/15/18 0700   PRN Meds: sodium chloride, acetaminophen **OR** acetaminophen, metoprolol tartrate, morphine injection, ondansetron (ZOFRAN) IV, oxyCODONE, potassium chloride, sodium chloride flush, traMADol   Vital Signs    Vitals:   09/15/18 0030 09/15/18 0130 09/15/18 0400 09/15/18 0500  BP:   (!) 113/57   Pulse: 99 94 80   Resp: 14 15 14    Temp:  (!) 100.7 F (38.2 C) 98.6 F (37 C)   TempSrc:  Axillary Oral   SpO2: 94% 95% 95%   Weight:    85.5 kg  Height:        Intake/Output Summary (Last 24 hours) at 09/15/2018 0706 Last data  filed at 09/15/2018 0700 Gross per 24 hour  Intake 3088.44 ml  Output 800 ml  Net 2288.44 ml   Filed Weights   09/14/18 0606 09/15/18 0500  Weight: 81.6 kg 85.5 kg    Physical Exam   GEN: Well nourished, well developed, in no acute distress.  HEENT: Grossly normal.  Neck: Supple, no JVD, carotid bruits, or masses. Cardiac: irreg irreg, no murmurs, rubs, or gallops. No clubbing, cyanosis. 2+ bilateral LE edema with chronic venous stasis changes.  Radials/DP/PT 2+ and equal bilaterally.  Respiratory:  Respirations regular and unlabored, clear to auscultation bilaterally. GI: Soft, nontender, nondistended, BS + x 4. MS: no deformity or atrophy. Skin: warm and dry, no rash. Groin sites with no hematoma Neuro:  Strength and sensation are intact. Psych: AAOx3.  Normal affect.  Labs    CBC Recent Labs    09/14/18 1230 09/15/18 0440  WBC  --  8.7  HGB 8.6* 8.1*  HCT 26.7* 26.2*  MCV  --  94.9  PLT  --  355*   Basic Metabolic Panel Recent Labs    09/14/18 0952 09/15/18 0440  NA 136 135  K 3.1* 3.1*  CL 96* 100  CO2  --  29  GLUCOSE 128* 104*  BUN 19 20  CREATININE 1.00 1.10  CALCIUM  --  8.0*  MG  --  1.8   Liver Function Tests No results for input(s): AST, ALT, ALKPHOS, BILITOT, PROT, ALBUMIN in the last 72 hours. No results for input(s): LIPASE, AMYLASE in the last 72 hours. Cardiac Enzymes No results for input(s): CKTOTAL, CKMB, CKMBINDEX, TROPONINI in the last 72 hours. BNP Invalid input(s): POCBNP D-Dimer No results for input(s): DDIMER in the last 72 hours. Hemoglobin A1C No results for input(s): HGBA1C in the last 72 hours. Fasting Lipid Panel No results for input(s): CHOL, HDL, LDLCALC, TRIG, CHOLHDL, LDLDIRECT in the last 72 hours. Thyroid Function Tests No results for input(s): TSH, T4TOTAL, T3FREE, THYROIDAB in the last 72 hours.  Invalid input(s): FREET3  Telemetry    afib with HRs in 80s and PVCs - Personally Reviewed  ECG    Slow afib  with new LBBB - Personally Reviewed  Radiology    Dg Chest Port 1 View  Result Date: 09/14/2018 CLINICAL DATA:  84 y/o  M; status post TAVR. EXAM: PORTABLE CHEST 1 VIEW COMPARISON:  09/10/2018 chest radiograph. FINDINGS: Stable cardiomegaly given projection and technique. Stented aortic valve replacement noted. Left central venous catheter tip projects over the left brachiocephalic vein. Aortic atherosclerosis with calcification. Clear lungs. No pleural effusion or pneumothorax. No acute osseous abnormality is evident. IMPRESSION: No acute pulmonary process identified. Interval TAVR. Left central venous catheter tip projects over left brachiocephalic vein. Electronically Signed   By: Kristine Garbe M.D.   On: 09/14/2018 14:52    Cardiac Studies    TAVR OPERATIVE NOTE   Date of Procedure:                09/14/2018  Preoperative Diagnosis:      Severe Aortic Stenosis   Postoperative Diagnosis:    Same   Procedure:        Transcatheter Aortic Valve Replacement - Percutaneous Right Transfemoral Approach             Edwards Sapien 3 THV (size 29 mm, model # 9600TFX, serial # [id number])              Co-Surgeons:                        Sherren Mocha, MD and Valentina Gu. Roxy Manns, MD   Anesthesiologist:                  Albertha Ghee, MD  Echocardiographer:              Sanda Klein, MD  Pre-operative Echo Findings: ? Severe aortic stenosis ? Normal left ventricular systolic function  Post-operative Echo Findings: ? No paravalvular leak ? Unchanged left ventricular systolic function  __________________  Post operative echo 09/15/18: pending  Patient Profile     [name] [name] is a 84 y.o. male with a history of CAD, chronic diastolic CHF, longstanding persistant atrial fibrillation on coumadin, carotid artery disease with CTO RICA, history of TIA, HTN, HLD, hypothyroidism, urinary incontinence and severe AS who presented to [facility] on 09/14/18 for planned  TAVR.  Assessment & Plan    Severe AS: s/p successful TAVR with a 29 mm Edwards Sapien 3 THV via the TF approach on 09/14/18. Post operative echo pending. Groin sites are stable. ECG with slow afib with new LBBB but no high grade heart block. Continue on Asiprin for now. He is on coumadin at home and contemplating enrolling in [place] trial  HTN: BP well controlled currently.   Chronic afib: coumadin on  hold currently. As above, candidate for Envisiage. HRs now in 80s with a lot of ectopy. Few episodes of tachycardia with HRs 110s. Will resume home Toprol 12.5mg  daily.   Chronic diastolic CHF: apprears euvolemic. Will resume home lasix 40mg  daily but continue to hold home Metolazone (3x a week.)  Hypokalemia: treated with IV K yesterday. K still 3.1. Will give PO K today and follow  CAD: pre TAVR cath showed calcified coronaries with mild non obst CAD and mod CAD in LAD (70% pLAD stenosis). Medical therapy was recommended.  Dispo: social work has been consulted to assess for SNF placement as the family does not feel equipt to care for him at home. He would like to go to [facility], Angelena Form, PA-C  09/15/2018, 7:06 AM  Pager [phone]  Patient seen, examined. Available data reviewed. Agree with findings, assessment, and plan as outlined by Nell Range, PA-C.  On exam this is an elderly male in no distress.  Lungs are clear, JVP is normal, heart is irregularly irregular with a soft systolic ejection murmur at the right upper sternal border, no diastolic murmur, abdomen is soft and nontender, extremities show advanced changes of stasis dermatitis bilaterally.  Overall the patient appears to be stable.  Telemetry is reviewed and demonstrates atrial fibrillation with left bundle branch block which is new.  There is no evidence of pauses or bradycardic episodes.  We will continue with current therapy.  We will work on placement for skilled nursing. Pt considering enrollment in  the Envisage Trial (warfarin versus edoxaban) with chronic atrial fibrillation/TAVR.  Sherren Mocha, M.D. 09/15/2018 1:28 PM

## 2018-09-15 NOTE — Progress Notes (Signed)
CARDIAC REHAB PHASE I   PRE:  Rate/Rhythm: 75 Afib  BP:  Sitting: 126/64      SaO2: 93 RA  MODE:  Ambulation: 100 ft   POST:  Rate/Rhythm: 88 Afib  BP:  Sitting: 132/57    SaO2: 94 RA   Pt ambulated 164ft in hallway assist of one with gait belt and front wheel walker. Pt with very slow gait, states he feels "a little weaker" than baseline. Pt helped back into bed. Call bell and phone within reach. Will continue to monitor.   [id number] Rufina Falco, RN BSN 09/15/2018 2:30 PM

## 2018-09-15 NOTE — NC FL2 (Signed)
[place] LEVEL OF CARE SCREENING TOOL     IDENTIFICATION  Patient Name: [name] [name] Birthdate: 29-Oct-1925 Sex: male Admission Date (Current Location): 09/14/2018  [facility] and [place] Number:  Herbalist and Address:  [facility], [place] [address] [place], [place] [address]      Provider Number: [id number]  Attending Physician Name and Address:  Sherren Mocha, MD  Relative Name and Phone Number:  Jettie Booze, daughter, [phone]    Current Level of Care: Hospital Recommended Level of Care: [place] Prior Approval Number:    Date Approved/Denied:   PASRR Number: pending  Discharge Plan: SNF    Current Diagnoses: Patient Active Problem List   Diagnosis Date Noted  . S/P TAVR (transcatheter aortic valve replacement)   . Urinary frequency 06/10/2018  . Lower respiratory infection 12/18/2017  . Iron deficiency anemia 12/16/2016  . Severe aortic stenosis 04/10/2015  . Chronic anticoagulation 02/17/2015  . Bilateral carotid artery disease ([place]) 09/12/2014  . Chronic systolic CHF (congestive heart failure), NYHA class 2 ([place]) 07/10/2014  . Hypothyroidism 07/04/2013  . Occlusion and stenosis of carotid artery without mention of cerebral infarction 05/18/2012  . COPD with asthma ([place]) 01/26/2012  . Long term (current) use of anticoagulants 02/24/2011  . Atrial fibrillation ([place]) 01/12/2009  . HLD (hyperlipidemia) 06/15/2007  . Benign essential HTN 06/15/2007    Orientation RESPIRATION BLADDER Height & Weight     Self, Time, Situation, Place  Normal Continent, External catheter Weight: 188 lb 7.9 oz (85.5 kg) Height:  5' 11.5" (181.6 cm)  BEHAVIORAL SYMPTOMS/MOOD NEUROLOGICAL BOWEL NUTRITION STATUS      Continent Diet(see discharge summary)  AMBULATORY STATUS COMMUNICATION OF NEEDS Skin   Limited Assist Verbally Surgical wounds(incisions on right and left groin with gauze and liquid skin adhesive)                        Personal Care Assistance Level of Assistance  Bathing, Feeding, Dressing Bathing Assistance: Limited assistance Feeding assistance: Limited assistance Dressing Assistance: Limited assistance     Functional Limitations Info  Sight, Hearing, Speech Sight Info: Adequate Hearing Info: Adequate Speech Info: Adequate    SPECIAL CARE FACTORS FREQUENCY  PT (By licensed PT), OT (By licensed OT)     PT Frequency: 5x week OT Frequency: 5x week            Contractures Contractures Info: Not present    Additional Factors Info  Code Status, Allergies Code Status Info: Full Code Allergies Info: LEVOFLOXACIN, KLOR-CON POTASSIUM CHLORIDE ER            Current Medications (09/15/2018):  This is the current hospital active medication list Current Facility-Administered Medications  Medication Dose Route Frequency Provider Last Rate Last Dose  . 0.9 %  sodium chloride infusion  250 mL Intravenous PRN Eileen Stanford, PA-C      . acetaminophen (TYLENOL) tablet 650 mg  650 mg Oral Q6H PRN Eileen Stanford, PA-C   650 mg at 09/15/18 0025   Or  . acetaminophen (TYLENOL) suppository 650 mg  650 mg Rectal Q6H PRN Eileen Stanford, PA-C      . aspirin chewable tablet 81 mg  81 mg Oral QHS Eileen Stanford, PA-C      . atorvastatin (LIPITOR) tablet 10 mg  10 mg Oral QHS Eileen Stanford, PA-C   10 mg at 09/14/18 2123  . cefUROXime (ZINACEF) 1.5 g  in sodium chloride 0.9 % 100 mL IVPB  1.5 g Intravenous Q12H Eileen Stanford, PA-C 200 mL/hr at 09/15/18 0700    . folic acid (FOLVITE) tablet 1 mg  1 mg Oral Daily Eileen Stanford, PA-C   1 mg at 09/15/18 0845  . furosemide (LASIX) tablet 40 mg  40 mg Oral Daily Eileen Stanford, PA-C   40 mg at 09/15/18 1059  . metoprolol succinate (TOPROL-XL) 24 hr tablet 12.5 mg  12.5 mg Oral Daily Eileen Stanford, PA-C   12.5 mg at 09/15/18 1100  . metoprolol tartrate (LOPRESSOR) injection 2.5-5 mg  2.5-5 mg  Intravenous Q2H PRN Eileen Stanford, PA-C      . morphine 2 MG/ML injection 2-5 mg  2-5 mg Intravenous Q1H PRN Eileen Stanford, PA-C      . nitroGLYCERIN 50 mg in dextrose 5 % 250 mL (0.2 mg/mL) infusion  0-100 mcg/min Intravenous Titrated Eileen Stanford, PA-C      . ondansetron [facility]) injection 4 mg  4 mg Intravenous Q6H PRN Eileen Stanford, PA-C      . oxyCODONE (Oxy IR/ROXICODONE) immediate release tablet 5-10 mg  5-10 mg Oral Q3H PRN Eileen Stanford, PA-C      . phenylephrine (NEOSYNEPHRINE) 20-0.9 MG/250ML-% infusion  0-100 mcg/min Intravenous Titrated Angelena Form R, PA-C      . potassium chloride 10 mEq in 50 mL *CENTRAL LINE* IVPB  10 mEq Intravenous Q1H PRN Angelena Form R, PA-C 50 mL/hr at 09/15/18 0700    . sodium chloride flush (NS) 0.9 % injection 3 mL  3 mL Intravenous Q12H Eileen Stanford, PA-C   3 mL at 09/14/18 1745  . sodium chloride flush (NS) 0.9 % injection 3 mL  3 mL Intravenous PRN Eileen Stanford, PA-C      . tamsulosin (FLOMAX) capsule 0.4 mg  0.4 mg Oral QPM Angelena Form R, PA-C   0.4 mg at 09/14/18 1743  . traMADol (ULTRAM) tablet 50-100 mg  50-100 mg Oral Q4H PRN Eileen Stanford, PA-C      . warfarin (COUMADIN) tablet 5 mg  5 mg Oral ONCE-1800 Kris Mouton, [facility]      . Warfarin - Pharmacist Dosing Inpatient   Does not apply q1800 Kris Mouton, [facility]         Discharge Medications: Please see discharge summary for a list of discharge medications.  Relevant Imaging Results:  Relevant Lab Results:   Additional Information SS# pending  Alexander Mt, LCSWA

## 2018-09-15 NOTE — Research (Signed)
Pt was randomized to warfarin in the ENVISAGE research study. He will continued to be followed by Coumadin clinic at his pcp office.

## 2018-09-15 NOTE — Progress Notes (Signed)
[place] for coumadin Indication: afib, s/p TAVR  Allergies  Allergen Reactions  . Levofloxacin Rash    "thought they were sticking swords thru me"  . Klor-Con [Potassium Chloride Er] Diarrhea    DIARRHEA    Patient Measurements: Height: 5' 11.5" (181.6 cm) Weight: 188 lb 7.9 oz (85.5 kg) IBW/kg (Calculated) : 76.45   Vital Signs: Temp: 98.9 F (37.2 C) (10/23 1158) Temp Source: Axillary (10/23 0850) BP: 131/61 (10/23 1100) Pulse Rate: 76 (10/23 1100)  Labs: Recent Labs    09/14/18 0928 09/14/18 0952 09/14/18 1230 09/15/18 0440  HGB 8.5* 7.8* 8.6* 8.1*  HCT 25.0* 23.0* 26.7* 26.2*  PLT  --   --   --  149*  CREATININE 1.00 1.00  --  1.10    Estimated Creatinine Clearance: 47.3 mL/min (by C-G formula based on SCr of 1.1 mg/dL).   Medical History: Past Medical History:  Diagnosis Date  . Atrial fibrillation, chronic    a. on coumadin   . Carotid artery occlusion    right total internal artery occlusion  . Chronic diastolic CHF (congestive heart failure) ([place])   . Coronary artery disease    s/p cardiac cath in 1990s, and cardiolite study  in 2005 showing EF 54%  . Degenerative disc disease    cervical spine  . GERD (gastroesophageal reflux disease)   . Hyperlipidemia   . Hypertension   . Hypothyroidism   . Rheumatoid arthritis ([place])   . S/P TAVR (transcatheter aortic valve replacement)    Edwards Sapien 3 THV (size 29 mm) via the R TF approach   . Skin cancer    forehead  . Stroke [facility])    TIA   . TIA (transient ischemic attack)   . Urgency incontinence      Assessment: 84 yo male on s/p TAVR on 10/22. He is on coumadin PTA for afib and pharmacy consulted to resume -INR= 1.35  Home coumadin dose: Coumadin: 5 mg TTSS, 7.5 mg MWF  Goal of Therapy:  INR 2-3 Monitor platelets by anticoagulation protocol: Yes   Plan:  -Will restart  Coumadin at 5mg  po today then likely dosing as PTA -Daily PT/INR  Hildred Laser, PharmD Clinical Pharmacist Please check Amion for pharmacy contact number

## 2018-09-15 NOTE — Clinical Social Work Note (Signed)
Clinical Social Work Assessment  Patient Details  Name: [name] [name] MRN: [id number] Date of Birth: 06/09/1925  Date of referral:  09/15/18               Reason for consult:  Facility Placement, Discharge Planning                Permission sought to share information with:  Facility Sport and exercise psychologist, Family Supports Permission granted to share information::  Yes, Verbal Permission Granted  Name::     [name] [name]  Agency::  [facility]  Relationship::  daughter  Contact Information:   [phone]  Housing/Transportation Living arrangements for the past 2 months:  [place] of Information:  Adult Children Patient Interpreter Needed:  None Criminal Activity/Legal Involvement Pertinent to Current Situation/Hospitalization:  No - Comment as needed Significant Relationships:  Adult Children, Other Family Members Lives with:  Self Do you feel safe going back to the place where you live?  Yes Need for family participation in patient care:  Yes (Comment)  Care giving concerns:  *Pt lives at home with assistance from his 3 children and an aide that assists him in the evening times. Does not have 24/7 assistance as recommended by physicians. Would benefit from SNF family feels to return home safely.    Social Worker assessment / plan: CSW spoke with pt daughter [name] [name] on the phone. [name] and her siblings take care of pt as needed: providing transportation and other needs IADLs and ADLs. Pt has previously been to [facility] and pt daughter felt that he improved well with therapies and returned home. She feels that he would benefit from continued therapies and supervision at night that comes with SNF placement. Preference for [facility] with understanding that it would likely be a private room.  CSW also requested that pt daughter provide pt's SSN to verify for PASRR, she states understanding and will find a document with his SSN on it and leave at front desk for hard chart.  CSW will f/u with to get SSN and submit for PASRR.   Employment status:  Retired Forensic scientist:  [employer] PT Recommendations:  [place], [place] / Referral to community resources:  [place]  Patient/Family's Response to care:  Pt daughter amenable to speaking with CSW stating "I was about to call and ask for you!" Pt daughter agreeable to SNF placement as is pt at [facility].  Patient/Family's Understanding of and Emotional Response to Diagnosis, Current Treatment, and Prognosis:  Pt daughter states good understanding of diagnosis, current treatment and prognosis. Pt daughter and her siblings provide good surrounding support to pt and are knowledgable about his strengths, and limitations. They are agreeable to pt returning home after rehab and express positive outlook. Pt daughter emotionally appropriate in affect over phone, sounds happy with care being provided at hospital.   Emotional Assessment Appearance:  Appears stated age Attitude/Demeanor/Rapport:  Gracious Affect (typically observed):  Pleasant, Accepting, Adaptable Orientation:  Oriented to Self, Oriented to Place, Oriented to  Time, Oriented to Situation Alcohol / Substance use:  Not Applicable Psych involvement (Current and /or in the community):  No (Comment)  Discharge Needs  Concerns to be addressed:  Care Coordination Readmission within the last 30 days:  No Current discharge risk:  Lives alone, Physical Impairment Barriers to Discharge:  Continued Medical Work up, [employer], [place] 09/15/2018, 2:59 PM

## 2018-09-15 NOTE — Research (Signed)
Spoke with patient about [employer] research study this morning. He said that he was very interested and told me to speak with his daughters. I told patient that I would call his daughter and come back up.   Spoke with [name] one of his daughters, and explained the study to her. She said that her and her other sister who was there yesterday with dad said it was fine to sign him up.  I will await echo results and then speak with pt.

## 2018-09-15 NOTE — Progress Notes (Signed)
  Echocardiogram 2D Echocardiogram has been performed.  [name] [name] 09/15/2018, 10:57 AM

## 2018-09-16 LAB — CBC
HCT: 25.4 % — ABNORMAL LOW (ref 39.0–52.0)
HEMOGLOBIN: 8 g/dL — AB (ref 13.0–17.0)
MCH: 30 pg (ref 26.0–34.0)
MCHC: 31.5 g/dL (ref 30.0–36.0)
MCV: 95.1 fL (ref 80.0–100.0)
Platelets: 125 10*3/uL — ABNORMAL LOW (ref 150–400)
RBC: 2.67 MIL/uL — ABNORMAL LOW (ref 4.22–5.81)
RDW: 16.9 % — AB (ref 11.5–15.5)
WBC: 7.6 10*3/uL (ref 4.0–10.5)
nRBC: 0 % (ref 0.0–0.2)

## 2018-09-16 LAB — PROTIME-INR
INR: 1.39
PROTHROMBIN TIME: 16.9 s — AB (ref 11.4–15.2)

## 2018-09-16 LAB — BASIC METABOLIC PANEL
Anion gap: 5 (ref 5–15)
BUN: 16 mg/dL (ref 8–23)
CALCIUM: 7.8 mg/dL — AB (ref 8.9–10.3)
CHLORIDE: 100 mmol/L (ref 98–111)
CO2: 30 mmol/L (ref 22–32)
Creatinine, Ser: 1.17 mg/dL (ref 0.61–1.24)
GFR calc Af Amer: >60 mL/min (ref >60)
GFR calc non Af Amer: 53 mL/min — ABNORMAL LOW (ref >60)
Glucose, Bld: 102 mg/dL — ABNORMAL HIGH (ref 70–99)
Potassium: 3.5 mmol/L (ref 3.5–5.1)
SODIUM: 135 mmol/L (ref 135–145)

## 2018-09-16 MED ORDER — WARFARIN SODIUM 5 MG PO TABS
5.0000 mg | ORAL_TABLET | Freq: Once | ORAL | Status: AC
  Administered 2018-09-16: 5 mg via ORAL
  Filled 2018-09-16: qty 1

## 2018-09-16 MED ORDER — GUAIFENESIN ER 600 MG PO TB12
600.0000 mg | ORAL_TABLET | Freq: Two times a day (BID) | ORAL | Status: DC | PRN
  Administered 2018-09-16 – 2018-09-17 (×2): 600 mg via ORAL
  Filled 2018-09-16 (×2): qty 1

## 2018-09-16 MED ORDER — POTASSIUM CHLORIDE CRYS ER 20 MEQ PO TBCR
20.0000 meq | EXTENDED_RELEASE_TABLET | Freq: Every day | ORAL | Status: DC
  Administered 2018-09-16 – 2018-09-17 (×2): 20 meq via ORAL
  Filled 2018-09-16 (×2): qty 1

## 2018-09-16 NOTE — Progress Notes (Signed)
Clinical Social Worker following patient for support and discharge need. Patient has a bed at [facility] (daughter choice facility). Facility is able to take patient in the morning and PA aware of discharge plan.   Rhea Pink, MSW,  [place]

## 2018-09-16 NOTE — Discharge Summary (Addendum)
[facility] VALVE TEAM  Discharge Summary    Patient ID: [name] [name] MRN: [id number]; DOB: [birth year]  Admit date: 09/14/2018 Discharge date: 09/17/2018  Primary Care Provider: [name], NP  Primary Cardiologist: Dr. Meda Coffee / Dr. Burt Knack & Dr. Roxy Manns (TAVR)  Discharge Diagnoses    Principal Problem:   S/P TAVR (transcatheter aortic valve replacement) Active Problems:   HLD (hyperlipidemia)   Benign essential HTN   Atrial fibrillation (HCC)   COPD with asthma ([place])   Hypothyroidism   Acute on chronic diastolic heart failure ([place])   Bilateral carotid artery disease (HCC)   Chronic anticoagulation   Severe aortic stenosis   Iron deficiency anemia   Allergies Allergies  Allergen Reactions  . Levofloxacin Rash    "thought they were sticking swords thru me"  . Klor-Con [Potassium Chloride Er] Diarrhea    DIARRHEA    Diagnostic Studies/Procedures     TAVR OPERATIVE NOTE   Date of Procedure:09/14/2018  Preoperative Diagnosis:Severe Aortic Stenosis   Postoperative Diagnosis:Same   Procedure:   Transcatheter Aortic Valve Replacement - PercutaneousRightTransfemoral Approach Edwards Sapien 3 THV (size 52mm, model # 9600TFX, serial # [id number])  Co-Surgeons:Jesika Men Burt Knack, MD andClarence H. Roxy Manns, MD    Pre-operative Echo Findings: ? Severe aortic stenosis ? Normalleft ventricular systolic function  Post-operative Echo Findings: ? Noparavalvular leak ? Unchangedleft ventricular systolic function  __________________  Post operative echo 09/15/18:  Study Conclusions - Left ventricle: The cavity size was normal. Wall thickness was increased in a pattern of mild LVH. Systolic function was normal. The estimated ejection fraction was in the range of 55% to 65%. Wall motion was normal; there were no regional wall  motion abnormalities. - Aortic valve: A bioprosthesis was present. Valve area (VTI): 2.35 cm^2. Valve area (Vmax): 2.53 cm^2. Valve area (Vmean): 2.4 cm^2. - Mitral valve: There was mild regurgitation. - Left atrium: The atrium was mildly dilated. - Right atrium: The atrium was moderately dilated. - Pulmonary arteries: Systolic pressure was moderately increased. PA peak pressure: 52 mm Hg (S).   History of Present Illness     [name] [name] is a 84 y.o. male with a history of CAD, chronic diastolic CHF, longstanding persistant atrial fibrillation on coumadin, carotid artery disease with CTO RICA, history of TIA, HTN, HLD, hypothyroidism, urinary incontinence and severe AS who presented to [facility] on 09/14/18 for planned TAVR.  Patient's cardiac history dates back to the early 1990s when he reportedly suffered an acute myocardial infarction. He reportedly underwent cardiac catheterization at that time and was found to have normal coronary arteries. He developed persistent atrial fibrillation and has remained on long-term warfarin anticoagulation ever since. He was followed for many years by Dr. Velora Heckler and more recently has been followed by Dr. Meda Coffee. He has had problems with chronic lower extremity edema. Echocardiograms have demonstrated the presence of low normal left ventricular systolic function with aortic stenosis that has gradually progressed in severity. Echocardiogram performed March 02, 2018 revealed severe aortic stenosis. Peak velocity across aortic valve measured 4.4 m/s corresponding to mean transvalvular gradient estimated 43 mmHg. The DVI was reportedly only 0.18. Left ventricular systolic function remains stable with ejection fraction estimated 50 to 55%. In May 2019 he was evaluated in the emergency department with a brief episode of chest pain relieved by nitroglycerin. Troponins were negative. He was seen in follow-up by Truitt Merle in July and subsequently the  patient was referred to the multidisciplinary heart valve clinic for  evaluation of his aortic stenosis. He was seen in consultation by Dr. Burt Knack on July 15, 2018 and diagnostic cardiac catheterization was performed July 21, 2018. Catheterization revealed coronary artery disease with 70% proximal stenosis of the left anterior descending coronary artery and otherwise moderate nonobstructive disease. Right heart pressures and cardiac output were normal.   The patient has been evaluated by the multidisciplinary valve team and felt to have severe, symptomatic aortic stenosis and to be a suitable candidate for TAVR, which was set up for 09/14/18.    Hospital Course     Consultants: none  Severe AS:s/p successful TAVR with a 29 mm Edwards Sapien 3 THV via the TF approach on 09/14/18. Post operative echo showed EF 55%, normally functioning TAVR with mean gradient of 8 mm Hg and no PVL. ECG with afib with new LBBB but no high grade heart block. Groin sites are stable. Continue on Asprin and coumadin. Plan for discharge to a SNF today with 1 week follow up in clinic.   HTN: BP well controlled    Chronic afib: rate well controlled on home Toprol XL 12.5mg  daily. Enrolled in [place] trial and was randomized back to Coumadin. This has been resumed.  Plan for INR check at SNF, next Monday or Tuesday. I have sent a staff message to Meriam Sprague at the [facility] Coumadin clinic to make sure they can follow and manage his coumadin.   Acute on chronic diastolic CHF: apprears euvolemic and weights have been stable. Home lasix was resumed at 40mg  daily. He had worsening coughing and wheezing on lung exam the day of discharge and repeat CXR ordered. This showed new pleural effusions and possible bronchitis edema. Plan to increase lasix to 40mg  BID and give one dose of IV lasix 20mg  now. He will be discharged on Lasix 40mg  BID and Kdur 20MEq BID. He also has PRN metolazone for worsening swelling, shortness  of breath or weight gain.   Hypokalemia: this has been repleted. I have discharged him on Kdur 20 Meq BID. Check BMET at follow up.   CAD: pre TAVR cath showed calcified coronaries with mild non obst CAD and mod CAD in LAD (70% pLAD stenosis). Medical therapy was recommended.  Anemia: Hg has remained stable ~ 8, which is his baseline.   Cough: with worsening productive cough and wheezing. No fevers or elevated WBC.  CXR with new pleural effusions and possible bronchitis edema. He was treated with one dose of IV lasix before discharge and outpatient lasix increased. I will also RX Doxycycline 100mg  BID x 5 days for empiric treatment of HCAP.  Dispo: discharge today to SNF at [facility]. I will see him back in clinic next week for early follow up.   _____________  Discharge Vitals Blood pressure 132/61, pulse 72, temperature 98.8 F (37.1 C), temperature source Oral, resp. rate 20, height 5' 11.5" (1.816 m), weight 85.4 kg, SpO2 97 %.  Filed Weights   09/15/18 0500 09/16/18 0439 09/17/18 0501  Weight: 85.5 kg 85.4 kg 85.4 kg    VS:  BP 132/61 (BP Location: Right Arm)   Pulse 72   Temp 98.8 F (37.1 C) (Oral)   Resp 20   Ht 5' 11.5" (1.816 m)   Wt 85.4 kg   SpO2 97%   BMI 25.88 kg/m    GEN: Well nourished, well developed, in no acute distress HEENT: normal Neck: no JVD or masses Cardiac: irreg irreg; no murmurs, rubs, or gallops, 1+ bilateral LE edema with advanced changes  of chronic venous stasis Respiratory: diffuse rhonchi and wheezing + productive cough GI: soft, nontender, nondistended, + BS MS: no deformity or atrophy Skin: warm and dry, no rash Neuro:  Alert and Oriented x 3, Strength and sensation are intact Psych: euthymic mood, full affect   Labs & Radiologic Studies    CBC Recent Labs    09/16/18 0416 09/17/18 0353  WBC 7.6 8.7  HGB 8.0* 8.0*  HCT 25.4* 25.6*  MCV 95.1 93.4  PLT 125* 220*   Basic Metabolic Panel Recent Labs    09/15/18 0440  09/16/18 0416 09/17/18 0353  NA 135 135 133*  K 3.1* 3.5 3.4*  CL 100 100 98  CO2 29 30 29   GLUCOSE 104* 102* 105*  BUN 20 16 16   CREATININE 1.10 1.17 1.16  CALCIUM 8.0* 7.8* 7.8*  MG 1.8  --   --    Liver Function Tests No results for input(s): AST, ALT, ALKPHOS, BILITOT, PROT, ALBUMIN in the last 72 hours. No results for input(s): LIPASE, AMYLASE in the last 72 hours. Cardiac Enzymes No results for input(s): CKTOTAL, CKMB, CKMBINDEX, TROPONINI in the last 72 hours. BNP Invalid input(s): POCBNP D-Dimer No results for input(s): DDIMER in the last 72 hours. Hemoglobin A1C No results for input(s): HGBA1C in the last 72 hours. Fasting Lipid Panel No results for input(s): CHOL, HDL, LDLCALC, TRIG, CHOLHDL, LDLDIRECT in the last 72 hours. Thyroid Function Tests No results for input(s): TSH, T4TOTAL, T3FREE, THYROIDAB in the last 72 hours.  Invalid input(s): FREET3 _____________  Dg Chest 2 View  Result Date: 09/17/2018 CLINICAL DATA:  Cough and wheezing. EXAM: CHEST - 2 VIEW COMPARISON:  Chest x-ray dated 09/14/2018 and 09/10/2018 and 04/20/2018 FINDINGS: Chronic cardiomegaly. TAVR. Tortuosity and calcification of the thoracic aorta. Small bilateral pleural effusions. Pulmonary vascularity is within normal limits but more prominent than on the prior exams. New slight peribronchial thickening. IMPRESSION: Findings most likely secondary to congestive heart failure with small bilateral pleural effusions, increased pulmonary vascularity as compared to prior studies, and slight peribronchial thickening which could represent bronchitis edema. Aortic Atherosclerosis (ICD10-I70.0). Electronically Signed   By: Lorriane Shire M.D.   On: 09/17/2018 14:04   Dg Chest 2 View  Result Date: 09/10/2018 CLINICAL DATA:  Preop exam.  TAVR scheduled for 09/14/2018. EXAM: CHEST - 2 VIEW COMPARISON:  Chest x-ray dated 04/20/2018. FINDINGS: Stable cardiomegaly. Atherosclerotic changes again noted at the  aortic arch. Lungs are hyperexpanded indicating COPD. Lungs are clear. No pleural effusion. No acute or suspicious osseous finding. IMPRESSION: 1. No active cardiopulmonary disease. 2. Hyperexpanded lungs indicating COPD. Associated upper lobe emphysematous change better demonstrated on recent chest CT of 08/12/2018. Electronically Signed   By: Franki Cabot M.D.   On: 09/10/2018 14:23   Dg Chest Port 1 View  Result Date: 09/14/2018 CLINICAL DATA:  84 y/o  M; status post TAVR. EXAM: PORTABLE CHEST 1 VIEW COMPARISON:  09/10/2018 chest radiograph. FINDINGS: Stable cardiomegaly given projection and technique. Stented aortic valve replacement noted. Left central venous catheter tip projects over the left brachiocephalic vein. Aortic atherosclerosis with calcification. Clear lungs. No pleural effusion or pneumothorax. No acute osseous abnormality is evident. IMPRESSION: No acute pulmonary process identified. Interval TAVR. Left central venous catheter tip projects over left brachiocephalic vein. Electronically Signed   By: Kristine Garbe M.D.   On: 09/14/2018 14:52   Disposition   Pt is being discharged to a SNF today in good condition.  Follow-up Plans & Appointments    Follow-up  Information    Eileen Stanford, PA-C. Go on 09/22/2018.   Specialties:  Cardiology, Radiology Why:  @ 2:30pm, please arrive at least 10 min early Contact information: [place] [phone]            Discharge Medications   Allergies as of 09/17/2018      Reactions   Levofloxacin Rash   "thought they were sticking swords thru me"   Klor-con [potassium Chloride Er] Diarrhea   DIARRHEA      Medication List    TAKE these medications   aspirin 81 MG tablet Take 81 mg by mouth at bedtime.   atorvastatin 10 MG tablet Commonly known as:  LIPITOR TAKE 1 TABLET AT BEDTIME   CALTRATE 600 PLUS-VIT D PO Take 1 tablet by mouth at bedtime.   docusate  sodium 100 MG capsule Commonly known as:  COLACE Take 1 capsule (100 mg total) by mouth daily as needed for mild constipation.   doxycycline 100 MG capsule Commonly known as:  VIBRAMYCIN Take 1 capsule (100 mg total) by mouth 2 (two) times daily.   folic acid 1 MG tablet Commonly known as:  FOLVITE Take 1 mg by mouth daily.   furosemide 40 MG tablet Commonly known as:  LASIX Take 1 tablet (40 mg total) by mouth 2 (two) times daily. What changed:  when to take this   guaiFENesin 600 MG 12 hr tablet Commonly known as:  MUCINEX Take 600-1,200 mg by mouth 2 (two) times daily as needed (cold/congestion.).   HEALTHY COLON PO Take 1 capsule by mouth daily.   methotrexate 2.5 MG tablet Commonly known as:  RHEUMATREX Take 10 mg by mouth once a week. Caution:Chemotherapy. Protect from light.   metolazone 2.5 MG tablet Commonly known as:  ZAROXOLYN Take 1 tablet (2.5 mg total) by mouth as needed. As needed for weight gain >3 lbs or worsening swelling. Take extra potassium with Metolazone What changed:    when to take this  reasons to take this  additional instructions   multivitamin with minerals Tabs tablet Take 1 tablet by mouth daily.   nitroGLYCERIN 0.4 MG SL tablet Commonly known as:  NITROSTAT Place 1 tablet (0.4 mg total) under the tongue every 5 (five) minutes as needed for chest pain.   potassium chloride SA 20 MEQ tablet Commonly known as:  K-DUR,KLOR-CON Take 1 tablet (20 mEq total) by mouth 2 (two) times daily.   tamsulosin 0.4 MG Caps capsule Commonly known as:  FLOMAX Take 0.4 mg by mouth every evening.   TOPROL XL 25 MG 24 hr tablet Generic drug:  metoprolol succinate TAKE ONE-HALF TABLET (12.5 MG TOTAL) DAILY WITH OR IMMEDIATELY FOLLOWING A MEAL (DOSE CHANGE) What changed:  See the new instructions.   warfarin 5 MG tablet Commonly known as:  COUMADIN Take as directed. If you are unsure how to take this medication, talk to your nurse or  doctor. Original instructions:  Take 1 tablet daily except 1 1/2 Mon Wed Fri or TAKE AS DIRECTED BY ANTICOAGULATION CLINIC What changed:    how much to take  how to take this  when to take this  additional instructions        Outstanding Labs/Studies   BMET ( at office next week), INR (at facility next Monday 10/29)--> call INR to Meriam Sprague at [facility] at [place].   Duration of Discharge Encounter   Greater than 30 minutes including physician time.  Signed, Angelena Form,  PA-C 09/17/2018, 2:33 PM [phone]  Patient seen, examined. Available data reviewed. Agree with findings, assessment, and plan as outlined by Nell Range, PA-C.  On my exam the patient is an elderly male in no distress.  JVP is normal, lungs are show diminished breath sounds in the bases with end expiratory wheezing, heart is irregular without murmur or gallop, abdomen is soft and nontender, extremities show changes of chronic stasis dermatitis bilaterally.  The patient's primary complaint is cough.  He otherwise feels well.  I think he is medically stable for discharge.  We did check a chest x-ray today which shows small bilateral pleural effusions and some increase in interstitial markings.  We have held metolazone while he is been here in the hospital.  We will give him a single dose of IV Lasix and increase his daily furosemide dose to 40 mg twice daily.  He will have a metabolic panel checked next week when he returns for outpatient follow-up.  Otherwise as outlined above.  Sherren Mocha, M.D. 09/17/2018 2:51 PM

## 2018-09-16 NOTE — Progress Notes (Signed)
Removed IJ per order. Catheter tip intact. Vaseline, gauze and tape placed. No active bleeding. Patient instrcuted to stay in bed for 30 minutes. Will continue to monitor

## 2018-09-16 NOTE — Progress Notes (Signed)
CARDIAC REHAB PHASE I   PRE:  Rate/Rhythm: 90 Afib  BP:  Sitting: 124/63      SaO2: 97 RA  MODE:  Ambulation: 300 ft   POST:  Rate/Rhythm: 78 Afib  BP:  Sitting: 129/59    SaO2: 98 RA   Pt ambulated 311ft in hallway assist of one with gait belt and rollator. Pts speed increased, along with distance. Pt denies pain or SOB. Pt returned to recliner, call bell within reach. Lunch tray set-up. Will continue to follow.  [phone] Rufina Falco, RN BSN 09/16/2018 11:47 AM

## 2018-09-16 NOTE — Progress Notes (Addendum)
Notified on call Dr. Aundra Dubin concerning pt's congested non-productive cough. Change in patient's lung sounds on right upper lobe from clear to clear diminished. Patient encouraged to use IS. Patient only reached 750 after trying 5 times. Whereas, patient was able to get to 1250 on 10/22 s/p TAVR.   Received call back from Dr. Aundra Dubin and asked if the patient could have an order for mucinex. Patient takes this at home BID. Dr. Aundra Dubin hung up with out addressing the need for mucinex. There are no cardiology standing orders.   Did not attempt to page Dr. Aundra Dubin back due to the previous hangup.   Placed patient on 2L for comfort. Oxygen sats 96%. There is some notable wheezing. Patient in no acute distress. Will continue to monitor and encourage patient to use IS.

## 2018-09-16 NOTE — Evaluation (Signed)
Occupational Therapy Evaluation Patient Details Name: [name] [name] MRN: [id number] DOB: 1925-06-26 Today's Date: 09/16/2018    History of Present Illness Pt is a 84 y.o. male admitted 09/14/18 for scheduled TAVR. PMH includes HTN, CAD, CHF, a-fib, RA, CVA, DDD, toe amputation.   Clinical Impression   Patient presenting with decreased I in self care, balance, functional mobility, safety awareness, strengthening, and endurance. Patient reports being Mod I with use of rollator or walking stick PTA. Daughters live nearby and help with IADL tasks. Patient currently functioning at min A overall and mod A with LB self care.Pt requires 1 L of O2 via [place] with functional mobility tasks this session.  Patient will benefit from acute OT to increase overall independence in the areas of ADLs, functional mobility, and safety in order to safely discharge to next venue of care.    Follow Up Recommendations  SNF;Supervision/Assistance - 24 hour    Equipment Recommendations  Other (comment)(defer to next venue of care)    Recommendations for Other Services Other (comment)(none at this time)     Precautions / Restrictions Precautions Precautions: Fall Restrictions Weight Bearing Restrictions: No      Mobility Bed Mobility Overal bed mobility: Needs Assistance Bed Mobility: Supine to Sit       Sit to supine: Min assist   General bed mobility comments: Min A to get B LEs off bed with increased time to scoot to EOB. Min verbal cuing for technique  Transfers Overall transfer level: Needs assistance Equipment used: Rolling walker (2 wheeled) Transfers: Sit to/from Stand Sit to Stand: Mod assist         General transfer comment: ModA to assist trunk elevation; poor eccentric control into sitting    Balance Overall balance assessment: Needs assistance   Sitting balance-Leahy Scale: Fair       Standing balance-Leahy Scale: Poor Standing balance comment: Reliant on UE support          ADL either performed or assessed with clinical judgement   ADL Overall ADL's : Needs assistance/impaired Eating/Feeding: Set up;Sitting   Grooming: Sitting;Set up   Upper Body Bathing: Set up;Sitting   Lower Body Bathing: Minimal assistance;Sit to/from stand   Upper Body Dressing : Set up;Sitting   Lower Body Dressing: Sit to/from stand;Moderate assistance   Toilet Transfer: Minimal assistance;RW   Toileting- Clothing Manipulation and Hygiene: Minimal assistance;Sit to/from stand       Functional mobility during ADLs: Min guard;Rolling walker       Vision Baseline Vision/History: Wears glasses Wears Glasses: At all times Patient Visual Report: No change from baseline              Pertinent Vitals/Pain Pain Assessment: No/denies pain     Hand Dominance Right   Extremity/Trunk Assessment Upper Extremity Assessment Upper Extremity Assessment: Generalized weakness   Lower Extremity Assessment Lower Extremity Assessment: Generalized weakness   Cervical / Trunk Assessment Cervical / Trunk Assessment: Kyphotic   Communication Communication Communication: No difficulties   Cognition Arousal/Alertness: Awake/alert Behavior During Therapy: WFL for tasks assessed/performed                   Home Living Family/patient expects to be discharged to:: Private residence Living Arrangements: Alone Available Help at Discharge: Family;Available PRN/intermittently Type of Home: House Home Access: Ramped entrance     Home Layout: One level     Bathroom Shower/Tub: Occupational psychologist: Standard     Home Equipment: Environmental consultant - 4 wheels;Other (comment)  Additional Comments: Daughters live nearby      Prior Functioning/Environment Level of Independence: Needs assistance  Gait / Transfers Assistance Needed: Mod indep with use of RW or walking stick. Does not drive; daughters live nearby and assist as needed ADL's / Homemaking Assistance  Needed: daughters take pt to appts and grocery shop            OT Problem List: Decreased strength;Decreased knowledge of use of DME or AE;Decreased coordination;Decreased activity tolerance;Cardiopulmonary status limiting activity;Impaired balance (sitting and/or standing);Decreased safety awareness;Pain      OT Treatment/Interventions: Self-care/ADL training;Balance training;Therapeutic exercise;Therapeutic activities;Energy conservation;Manual therapy;Patient/family education    OT Goals(Current goals can be found in the care plan section) Acute Rehab OT Goals Patient Stated Goal: Get stronger at rehab before returning home OT Goal Formulation: With patient Time For Goal Achievement: 09/30/18 Potential to Achieve Goals: Good ADL Goals Pt Will Perform Grooming: with supervision Pt Will Perform Upper Body Bathing: with supervision Pt Will Perform Lower Body Bathing: with supervision Pt Will Perform Upper Body Dressing: with supervision Pt Will Perform Lower Body Dressing: with supervision Pt Will Transfer to Toilet: with supervision Pt Will Perform Toileting - Clothing Manipulation and hygiene: with supervision  OT Frequency: Min 2X/week   Barriers to D/C: Other (comment)  none known at this time          AM-PAC PT "6 Clicks" Daily Activity     Outcome Measure Help from another person eating meals?: A Little Help from another person taking care of personal grooming?: A Little Help from another person toileting, which includes using toliet, bedpan, or urinal?: A Little Help from another person bathing (including washing, rinsing, drying)?: A Little Help from another person to put on and taking off regular upper body clothing?: A Little Help from another person to put on and taking off regular lower body clothing?: A Lot 6 Click Score: 17   End of Session Equipment Utilized During Treatment: Rolling walker Nurse Communication: Mobility status  Activity Tolerance: Patient  tolerated treatment well Patient left: in chair;with call bell/phone within reach  OT Visit Diagnosis: Muscle weakness (generalized) (M62.81)                Time: 1025-8527 OT Time Calculation (min): 24 min Charges:  OT General Charges $OT Visit: 1 Visit OT Evaluation $OT Eval Low Complexity: 1 Low OT Treatments $Self Care/Home Management : 8-22 mins   Juron Vorhees P, MS, OTR/L 09/16/2018, 11:31 AM

## 2018-09-16 NOTE — Consult Note (Signed)
            [facility] CM Primary Care Navigator  09/16/2018  TIN ENGRAM 03/21/1925 [id number]   Seenpatientat the bedsidetoidentify possible discharge needs. Patient had been admitted for severe aortic stenosis, status post TAVR- transcatheter aortic valve replacement.  Patientendorses [name], NP with [employer] [place] as [place] care provider.   [place] on [employer] and [employer] Order service to obtain medications without difficulty.  Patient reports that daughters Olin Hauser, [name] and Jana Half) have beenmanaginghis medicationsat homewith use of"pillbox" system filledevery week.  Patientmentioned that his daughters have beenproviding transportation tohis doctors' appointments.  [place] but her daughters (all working) can be able to hire caregiver if needed after his discharge home. He verbalized having RN and PT coming over to his house prior to this admission and hopes to have it when he gets back home.  Anticipated plan for discharge isskilled nursing facility (SNF)for rehabilitationper therapy recommendation.   Patientvoiced understandingto callprimarycareprovider'soffice once he returnshome,for a post discharge follow-upvisitwithin1- 2 weeks or sooner if needs arise.Patient letter (with PCP's contact number) was provided asareminder.  Explained topatient aboutTHN CM services available for health management andresourcesat homebut he indicated that his 3 daughters are capable in providing help in managing his health needs at home. Patient was encouraged to discuss with primary care provider in his next visit, about further needsand assistance in managinghealth conditions once he getsback home.    Patient expressedunderstandingto seekreferral from primary care provider to [facility] care management ifdeemed necessary and appropriate for any  servicesin thenearfuture.  [facility] care management information was provided for futureneeds thathe may have.  Primary care provider's office is listed as providing transition of care (TOC) follow-up.   For additional questions please contact:  Edwena Felty A. Teaghan Melrose, BSN, RN-BC [facility] PRIMARY CARE Navigator Cell: [phone]

## 2018-09-16 NOTE — Plan of Care (Signed)
  Problem: Education: Goal: Knowledge of General Education information will improve Description Including pain rating scale, medication(s)/side effects and non-pharmacologic comfort measures Outcome: Progressing   Problem: Health Behavior/Discharge Planning: Goal: Ability to manage health-related needs will improve Outcome: Progressing   Problem: Clinical Measurements: Goal: Will remain free from infection Outcome: Progressing Goal: Respiratory complications will improve Outcome: Progressing   Problem: Activity: Goal: Risk for activity intolerance will decrease Outcome: Progressing   Problem: Elimination: Goal: Will not experience complications related to bowel motility Outcome: Progressing Goal: Will not experience complications related to urinary retention Outcome: Progressing

## 2018-09-16 NOTE — Progress Notes (Signed)
[place] for coumadin Indication: afib, s/p TAVR  Allergies  Allergen Reactions  . Levofloxacin Rash    "thought they were sticking swords thru me"  . Klor-Con [Potassium Chloride Er] Diarrhea    DIARRHEA    Patient Measurements: Height: 5' 11.5" (181.6 cm) Weight: 188 lb 3.2 oz (85.4 kg) IBW/kg (Calculated) : 76.45   Vital Signs: Temp: 98.5 F (36.9 C) (10/24 0745) Temp Source: Oral (10/24 0745) BP: 118/55 (10/24 0745) Pulse Rate: 73 (10/24 0745)  Labs: Recent Labs    09/14/18 0952 09/14/18 1230 09/15/18 0440 09/16/18 0416  HGB 7.8* 8.6* 8.1* 8.0*  HCT 23.0* 26.7* 26.2* 25.4*  PLT  --   --  149* 125*  LABPROT  --   --   --  16.9*  INR  --   --   --  1.39  CREATININE 1.00  --  1.10 1.17    Estimated Creatinine Clearance: 44.5 mL/min (by C-G formula based on SCr of 1.17 mg/dL).   Medical History: Past Medical History:  Diagnosis Date  . Atrial fibrillation, chronic    a. on coumadin   . Carotid artery occlusion    right total internal artery occlusion  . Chronic diastolic CHF (congestive heart failure) ([place])   . Coronary artery disease    s/p cardiac cath in 1990s, and cardiolite study  in 2005 showing EF 54%  . Degenerative disc disease    cervical spine  . GERD (gastroesophageal reflux disease)   . Hyperlipidemia   . Hypertension   . Hypothyroidism   . Rheumatoid arthritis ([place])   . S/P TAVR (transcatheter aortic valve replacement)    Edwards Sapien 3 THV (size 29 mm) via the R TF approach   . Skin cancer    forehead  . Stroke [facility])    TIA   . TIA (transient ischemic attack)   . Urgency incontinence      Assessment: 84 yo male on s/p TAVR on 10/22. He is on coumadin PTA for afib.  INR 1.39 tonight, warfarin restarted last night. Hgb 8, plt 125. No s/sx of bleeding.   Home coumadin dose: Coumadin: 5 mg TTSS, 7.5 mg MWF  Goal of Therapy:  INR 2-3 Monitor platelets by anticoagulation protocol: Yes    Plan:  -Continue warfarin 5 mg tonight -Daily PT/INR  Doylene Canard, PharmD Clinical Pharmacist  Pager: [phone] Phone: [phone] Please check Amion for pharmacy contact number

## 2018-09-16 NOTE — Progress Notes (Addendum)
[place] VALVE TEAM  Patient Name: [name] [name] Date of Encounter: 09/16/2018  Primary Cardiologist: Dr. Meda Coffee / Dr. Burt Knack & Dr. Roxy Manns (TAVR)  Hospital Problem List     Principal Problem:   S/P TAVR (transcatheter aortic valve replacement) Active Problems:   HLD (hyperlipidemia)   Benign essential HTN   Atrial fibrillation (HCC)   COPD with asthma ([place])   Hypothyroidism   Chronic systolic CHF (congestive heart failure), NYHA class 2 (HCC)   Bilateral carotid artery disease (HCC)   Chronic anticoagulation   Severe aortic stenosis   Iron deficiency anemia    Subjective   Feels great. Walked around halls with no issues. Awaiting bed placement at SNF  Inpatient Medications    Scheduled Meds: . aspirin  81 mg Oral QHS  . atorvastatin  10 mg Oral QHS  . folic acid  1 mg Oral Daily  . furosemide  40 mg Oral Daily  . metoprolol succinate  12.5 mg Oral Daily  . sodium chloride flush  3 mL Intravenous Q12H  . tamsulosin  0.4 mg Oral QPM  . warfarin  5 mg Oral ONCE-1800  . Warfarin - Pharmacist Dosing Inpatient   Does not apply q1800   Continuous Infusions: . sodium chloride    . nitroGLYCERIN    . phenylephrine (NEO-SYNEPHRINE) Adult infusion    . potassium chloride 50 mL/hr at 09/15/18 0700   PRN Meds: sodium chloride, acetaminophen **OR** acetaminophen, guaiFENesin, metoprolol tartrate, morphine injection, ondansetron (ZOFRAN) IV, oxyCODONE, potassium chloride, sodium chloride flush, traMADol   Vital Signs    Vitals:   09/16/18 0034 09/16/18 0401 09/16/18 0439 09/16/18 0745  BP: (!) 113/49 (!) 107/51  (!) 118/55  Pulse: 76 69  73  Resp: 17 16  17   Temp: 98.9 F (37.2 C) 98.8 F (37.1 C)  98.5 F (36.9 C)  TempSrc: Oral Oral  Oral  SpO2: 90% 91%  92%  Weight:   85.4 kg   Height:        Intake/Output Summary (Last 24 hours) at 09/16/2018 1121 Last data filed at 09/16/2018 0900 Gross per 24 hour  Intake 830  ml  Output 950 ml  Net -120 ml   Filed Weights   09/14/18 0606 09/15/18 0500 09/16/18 0439  Weight: 81.6 kg 85.5 kg 85.4 kg    Physical Exam   GEN: Well nourished, well developed, in no acute distress.  HEENT: Grossly normal.  Neck: Supple, no JVD, carotid bruits, or masses. Cardiac: irreg irreg, no murmurs, rubs, or gallops. No clubbing, cyanosis. 2+ bilateral LE edema with chronic venous stasis changes.  Radials/DP/PT 2+ and equal bilaterally.  Respiratory:  Respirations regular and unlabored, clear to auscultation bilaterally. GI: Soft, nontender, nondistended, BS + x 4. MS: no deformity or atrophy. Skin: warm and dry, no rash. Groin sites with no hematoma Neuro:  Strength and sensation are intact. Psych: AAOx3.  Normal affect.  Labs    CBC Recent Labs    09/15/18 0440 09/16/18 0416  WBC 8.7 7.6  HGB 8.1* 8.0*  HCT 26.2* 25.4*  MCV 94.9 95.1  PLT 149* 921*   Basic Metabolic Panel Recent Labs    09/15/18 0440 09/16/18 0416  NA 135 135  K 3.1* 3.5  CL 100 100  CO2 29 30  GLUCOSE 104* 102*  BUN 20 16  CREATININE 1.10 1.17  CALCIUM 8.0* 7.8*  MG 1.8  --    Liver Function Tests No results for  input(s): AST, ALT, ALKPHOS, BILITOT, PROT, ALBUMIN in the last 72 hours. No results for input(s): LIPASE, AMYLASE in the last 72 hours. Cardiac Enzymes No results for input(s): CKTOTAL, CKMB, CKMBINDEX, TROPONINI in the last 72 hours. BNP Invalid input(s): POCBNP D-Dimer No results for input(s): DDIMER in the last 72 hours. Hemoglobin A1C No results for input(s): HGBA1C in the last 72 hours. Fasting Lipid Panel No results for input(s): CHOL, HDL, LDLCALC, TRIG, CHOLHDL, LDLDIRECT in the last 72 hours. Thyroid Function Tests No results for input(s): TSH, T4TOTAL, T3FREE, THYROIDAB in the last 72 hours.  Invalid input(s): FREET3  Telemetry    afib with HRs in 80s and PVCs - Personally Reviewed  ECG    afib with new LBBB - Personally Reviewed  Radiology      Dg Chest Port 1 View  Result Date: 09/14/2018 CLINICAL DATA:  84 y/o  M; status post TAVR. EXAM: PORTABLE CHEST 1 VIEW COMPARISON:  09/10/2018 chest radiograph. FINDINGS: Stable cardiomegaly given projection and technique. Stented aortic valve replacement noted. Left central venous catheter tip projects over the left brachiocephalic vein. Aortic atherosclerosis with calcification. Clear lungs. No pleural effusion or pneumothorax. No acute osseous abnormality is evident. IMPRESSION: No acute pulmonary process identified. Interval TAVR. Left central venous catheter tip projects over left brachiocephalic vein. Electronically Signed   By: Kristine Garbe M.D.   On: 09/14/2018 14:52    Cardiac Studies    TAVR OPERATIVE NOTE   Date of Procedure:                09/14/2018  Preoperative Diagnosis:      Severe Aortic Stenosis   Postoperative Diagnosis:    Same   Procedure:        Transcatheter Aortic Valve Replacement - Percutaneous Right Transfemoral Approach             Edwards Sapien 3 THV (size 29 mm, model # 9600TFX, serial # [id number])              Co-Surgeons:                        Sherren Mocha, MD and Valentina Gu. Roxy Manns, MD   Anesthesiologist:                  Albertha Ghee, MD  Echocardiographer:              Sanda Klein, MD  Pre-operative Echo Findings: ? Severe aortic stenosis ? Normal left ventricular systolic function  Post-operative Echo Findings: ? No paravalvular leak ? Unchanged left ventricular systolic function  __________________  Post operative echo 09/15/18:  Study Conclusions - Left ventricle: The cavity size was normal. Wall thickness was   increased in a pattern of mild LVH. Systolic function was normal.   The estimated ejection fraction was in the range of 55% to 65%.   Wall motion was normal; there were no regional wall motion   abnormalities. - Aortic valve: A bioprosthesis was present. Valve area (VTI): 2.35   cm^2. Valve  area (Vmax): 2.53 cm^2. Valve area (Vmean): 2.4 cm^2. - Mitral valve: There was mild regurgitation. - Left atrium: The atrium was mildly dilated. - Right atrium: The atrium was moderately dilated. - Pulmonary arteries: Systolic pressure was moderately increased.   PA peak pressure: 52 mm Hg (S).  Patient Profile     [name] [name] is a 84 y.o. male with a history of CAD, chronic diastolic CHF, longstanding persistant  atrial fibrillation on coumadin, carotid artery disease with CTO RICA, history of TIA, HTN, HLD, hypothyroidism, urinary incontinence and severe AS who presented to [facility] on 09/14/18 for planned TAVR.    Assessment & Plan    Severe AS: s/p successful TAVR with a 29 mm Edwards Sapien 3 THV via the TF approach on 09/14/18. Post operative echo showed EF 55%, normally functioning TAVR with mean gradient of 8 mm Hg and no PVL. Groin sites are stable. ECG with afib with new LBBB but no high grade heart block. Continue on Asiprin and coumadin.   HTN: BP well controlled currently.   Chronic afib: rate well controlled on home Toprol XL 12.5mg  daily. Enrolled in [place] trial and was randomized back to Coumadin. This has been resumed.    Chronic diastolic CHF: apprears euvolemic. Home lasix has been resumed. He is also on Metolazone three times a week which I have held.  Hypokalemia: this has been repleted   CAD: pre TAVR cath showed calcified coronaries with mild non obst CAD and mod CAD in LAD (70% pLAD stenosis). Medical therapy was recommended.  Anemia: Hg remains stable around 8, which is around his baseline.   Dispo: patient had [facility] bed [facility] but somehow this was given up and doesn't have availability until next week. [name] with [facility] is working on placement elsewhere.   SignedAngelena Form, PA-C  09/16/2018, 11:21 AM  Pager [phone]  Patient seen, examined. Available data reviewed. Agree with findings, assessment, and plan as outlined by Nell Range, PA.   On my exam today the patient is alert, oriented, in no distress.  Lung fields are clear, heart is irregular with no appreciable murmur, abdomen is soft and nontender, bilateral groin sites are clear, extremities show advanced changes of chronic venous stasis.  The patient's echocardiogram is reviewed and demonstrates normal function of his transcatheter heart valve.  He is awaiting placement at a skilled nursing facility.  Sherren Mocha, M.D. 09/16/2018 12:12 PM

## 2018-09-17 ENCOUNTER — Inpatient Hospital Stay (HOSPITAL_COMMUNITY): Payer: Medicare Other

## 2018-09-17 ENCOUNTER — Encounter: Payer: Self-pay | Admitting: Thoracic Surgery (Cardiothoracic Vascular Surgery)

## 2018-09-17 DIAGNOSIS — M2550 Pain in unspecified joint: Secondary | ICD-10-CM | POA: Diagnosis not present

## 2018-09-17 DIAGNOSIS — R1311 Dysphagia, oral phase: Secondary | ICD-10-CM | POA: Diagnosis not present

## 2018-09-17 DIAGNOSIS — I350 Nonrheumatic aortic (valve) stenosis: Secondary | ICD-10-CM | POA: Diagnosis not present

## 2018-09-17 DIAGNOSIS — I2510 Atherosclerotic heart disease of native coronary artery without angina pectoris: Secondary | ICD-10-CM | POA: Diagnosis not present

## 2018-09-17 DIAGNOSIS — I4891 Unspecified atrial fibrillation: Secondary | ICD-10-CM | POA: Diagnosis not present

## 2018-09-17 DIAGNOSIS — E039 Hypothyroidism, unspecified: Secondary | ICD-10-CM | POA: Diagnosis not present

## 2018-09-17 DIAGNOSIS — R278 Other lack of coordination: Secondary | ICD-10-CM | POA: Diagnosis not present

## 2018-09-17 DIAGNOSIS — I6529 Occlusion and stenosis of unspecified carotid artery: Secondary | ICD-10-CM | POA: Diagnosis not present

## 2018-09-17 DIAGNOSIS — Q253 Supravalvular aortic stenosis: Secondary | ICD-10-CM | POA: Diagnosis not present

## 2018-09-17 DIAGNOSIS — Z7401 Bed confinement status: Secondary | ICD-10-CM | POA: Diagnosis not present

## 2018-09-17 DIAGNOSIS — Z48812 Encounter for surgical aftercare following surgery on the circulatory system: Secondary | ICD-10-CM | POA: Diagnosis not present

## 2018-09-17 DIAGNOSIS — R05 Cough: Secondary | ICD-10-CM | POA: Diagnosis not present

## 2018-09-17 DIAGNOSIS — I509 Heart failure, unspecified: Secondary | ICD-10-CM | POA: Diagnosis not present

## 2018-09-17 DIAGNOSIS — Z23 Encounter for immunization: Secondary | ICD-10-CM | POA: Diagnosis not present

## 2018-09-17 DIAGNOSIS — R41841 Cognitive communication deficit: Secondary | ICD-10-CM | POA: Diagnosis not present

## 2018-09-17 DIAGNOSIS — I110 Hypertensive heart disease with heart failure: Secondary | ICD-10-CM | POA: Diagnosis not present

## 2018-09-17 DIAGNOSIS — I5033 Acute on chronic diastolic (congestive) heart failure: Secondary | ICD-10-CM | POA: Diagnosis not present

## 2018-09-17 DIAGNOSIS — R2689 Other abnormalities of gait and mobility: Secondary | ICD-10-CM | POA: Diagnosis not present

## 2018-09-17 DIAGNOSIS — J189 Pneumonia, unspecified organism: Secondary | ICD-10-CM | POA: Diagnosis not present

## 2018-09-17 DIAGNOSIS — Z952 Presence of prosthetic heart valve: Secondary | ICD-10-CM | POA: Diagnosis not present

## 2018-09-17 DIAGNOSIS — E785 Hyperlipidemia, unspecified: Secondary | ICD-10-CM | POA: Diagnosis not present

## 2018-09-17 DIAGNOSIS — I4811 Longstanding persistent atrial fibrillation: Secondary | ICD-10-CM | POA: Diagnosis not present

## 2018-09-17 DIAGNOSIS — D509 Iron deficiency anemia, unspecified: Secondary | ICD-10-CM | POA: Diagnosis not present

## 2018-09-17 DIAGNOSIS — I10 Essential (primary) hypertension: Secondary | ICD-10-CM | POA: Diagnosis not present

## 2018-09-17 DIAGNOSIS — J449 Chronic obstructive pulmonary disease, unspecified: Secondary | ICD-10-CM | POA: Diagnosis not present

## 2018-09-17 LAB — BASIC METABOLIC PANEL
Anion gap: 6 (ref 5–15)
BUN: 16 mg/dL (ref 8–23)
CO2: 29 mmol/L (ref 22–32)
Calcium: 7.8 mg/dL — ABNORMAL LOW (ref 8.9–10.3)
Chloride: 98 mmol/L (ref 98–111)
Creatinine, Ser: 1.16 mg/dL (ref 0.61–1.24)
GFR calc Af Amer: >60 mL/min (ref >60)
GFR calc non Af Amer: 53 mL/min — ABNORMAL LOW (ref >60)
GLUCOSE: 105 mg/dL — AB (ref 70–99)
POTASSIUM: 3.4 mmol/L — AB (ref 3.5–5.1)
Sodium: 133 mmol/L — ABNORMAL LOW (ref 135–145)

## 2018-09-17 LAB — CBC
HEMATOCRIT: 25.6 % — AB (ref 39.0–52.0)
Hemoglobin: 8 g/dL — ABNORMAL LOW (ref 13.0–17.0)
MCH: 29.2 pg (ref 26.0–34.0)
MCHC: 31.3 g/dL (ref 30.0–36.0)
MCV: 93.4 fL (ref 80.0–100.0)
Platelets: 128 10*3/uL — ABNORMAL LOW (ref 150–400)
RBC: 2.74 MIL/uL — AB (ref 4.22–5.81)
RDW: 16.9 % — AB (ref 11.5–15.5)
WBC: 8.7 10*3/uL (ref 4.0–10.5)
nRBC: 0 % (ref 0.0–0.2)

## 2018-09-17 LAB — PROTIME-INR
INR: 1.37
Prothrombin Time: 16.7 seconds — ABNORMAL HIGH (ref 11.4–15.2)

## 2018-09-17 MED ORDER — POTASSIUM CHLORIDE CRYS ER 20 MEQ PO TBCR
40.0000 meq | EXTENDED_RELEASE_TABLET | Freq: Once | ORAL | Status: AC
  Administered 2018-09-17: 40 meq via ORAL
  Filled 2018-09-17: qty 2

## 2018-09-17 MED ORDER — METOLAZONE 2.5 MG PO TABS: 2.5000 mg | ORAL_TABLET | ORAL | 3 refills | Status: DC | PRN

## 2018-09-17 MED ORDER — FUROSEMIDE 40 MG PO TABS: 40.0000 mg | ORAL_TABLET | Freq: Two times a day (BID) | ORAL | 6 refills | Status: DC

## 2018-09-17 MED ORDER — FUROSEMIDE 40 MG PO TABS: 40.0000 mg | ORAL_TABLET | Freq: Every day | ORAL | 6 refills | Status: DC

## 2018-09-17 MED ORDER — DOXYCYCLINE HYCLATE 100 MG PO CAPS: 100.0000 mg | ORAL_CAPSULE | Freq: Two times a day (BID) | ORAL | 0 refills | Status: DC

## 2018-09-17 MED ORDER — DOCUSATE SODIUM 100 MG PO CAPS
100.0000 mg | ORAL_CAPSULE | Freq: Every day | ORAL | Status: DC | PRN
  Administered 2018-09-17: 100 mg via ORAL
  Filled 2018-09-17: qty 1

## 2018-09-17 MED ORDER — FUROSEMIDE 10 MG/ML IJ SOLN
20.0000 mg | Freq: Once | INTRAMUSCULAR | Status: AC
  Administered 2018-09-17: 20 mg via INTRAVENOUS
  Filled 2018-09-17: qty 2

## 2018-09-17 MED ORDER — POTASSIUM CHLORIDE CRYS ER 20 MEQ PO TBCR: 20.0000 meq | EXTENDED_RELEASE_TABLET | Freq: Two times a day (BID) | ORAL | 6 refills | Status: DC

## 2018-09-17 MED ORDER — DOCUSATE SODIUM 100 MG PO CAPS: 100.0000 mg | ORAL_CAPSULE | Freq: Every day | ORAL | 6 refills | Status: DC | PRN

## 2018-09-17 MED ORDER — WARFARIN SODIUM 10 MG PO TABS
10.0000 mg | ORAL_TABLET | Freq: Once | ORAL | Status: AC
  Administered 2018-09-17: 10 mg via ORAL
  Filled 2018-09-17: qty 1

## 2018-09-17 MED ORDER — POTASSIUM CHLORIDE CRYS ER 20 MEQ PO TBCR: 20.0000 meq | EXTENDED_RELEASE_TABLET | Freq: Every day | ORAL | 6 refills | Status: DC

## 2018-09-17 NOTE — Progress Notes (Signed)
ANTICOAGULATION CONSULT NOTE - Follow Up Consult  Pharmacy Consult for Coumadin Indication: atrial fibrillation  Allergies  Allergen Reactions  . Levofloxacin Rash    "thought they were sticking swords thru me"  . Klor-Con [Potassium Chloride Er] Diarrhea    DIARRHEA    Patient Measurements: Height: 5' 11.5" (181.6 cm) Weight: 188 lb 3.2 oz (85.4 kg) IBW/kg (Calculated) : 76.45  Vital Signs: Temp: 98.8 F (37.1 C) (10/25 0501) Temp Source: Oral (10/25 0501) BP: 119/54 (10/25 0501) Pulse Rate: 77 (10/24 2049)  Labs: Recent Labs    09/15/18 0440 09/16/18 0416 09/17/18 0353  HGB 8.1* 8.0* 8.0*  HCT 26.2* 25.4* 25.6*  PLT 149* 125* 128*  LABPROT  --  16.9* 16.7*  INR  --  1.39 1.37  CREATININE 1.10 1.17 1.16    Estimated Creatinine Clearance: 44.9 mL/min (by C-G formula based on SCr of 1.16 mg/dL).  Assessment:  Anticoag: coumadin PTA for afib. s/p TAVR 10/22. SCDs for now -Last INR 1.37 not rising. CBC stable -PTA Coumadin: 5 mg TTSS, 7.5 mg MWF, last dose "past week" per med hx.    Goal of Therapy:  INR 2-3 Monitor platelets by anticoagulation protocol: Yes   Plan:  Warfarin 10 mg tonight  Monitor INR  Jermaine Tholl S. Alford Highland, PharmD, BCPS Clinical Staff Pharmacist  Eilene Ghazi Stillinger 09/17/2018,8:47 AM

## 2018-09-17 NOTE — Progress Notes (Signed)
Clinical Social Worker facilitated patient discharge including contacting patient family and facility to confirm patient discharge plans.  Clinical information faxed to facility and family agreeable with plan.  CSW arranged ambulance transport via [facility] to [place] .  RN to call [phone] (rm# [id number]) for report prior to discharge.  Clinical Social Worker will sign off for now as social work intervention is no longer needed. Please consult [place] again if new need arises.  Rhea Pink, MSW, [place]

## 2018-09-17 NOTE — Discharge Instructions (Signed)
ACTIVITY AND EXERCISE °• Daily activity and exercise are an important part of your recovery. People recover at different rates depending on their general health and type of valve procedure. °• Most people recovering from TAVR feel better relatively quickly  °• No lifting, pushing, pulling more than 10 pounds (examples to avoid: groceries, vacuuming, gardening, golfing): °            - For one week with a procedure through the groin. °            - For six weeks for procedures through the chest wall or neck °NOTE: You will typically see one of our providers 7-14 days after your procedure to discuss WHEN TO RESUME the above activities.  °  °  °DRIVING °• Do not drive for until you are seen for follow up and cleared by a provider. Generally, we ask patient to not drive for 1 week after their procedure. °• If you have been told by your doctor in the past that you may not drive, you must talk with him/her before you begin driving again. °  °  °DRESSING °• Groin site: you may leave the clear dressing over the site for up to one week or until it falls off. °  °  °HYGIENE °• If you had a femoral (leg) procedure, you may take a shower when you return home. After the shower, pat the site dry. Do NOT use powder, oils or lotions in your groin area until the site has completely healed. °• If you had a chest procedure, you may shower when you return home unless specifically instructed not to by your discharging practitioner. °            - DO NOT scrub incision; pat dry with a towel °            - DO NOT apply any lotions, oils, powders to the incision °            - No tub baths / swimming for at least 2 weeks. °• If you notice any fevers, chills, increased pain, swelling, bleeding or pus, please contact your doctor. °  °ADDITIONAL INFORMATION °• If you are going to have an upcoming dental procedure, please contact our office as you will require antibiotics ahead of time to prevent infection on your heart valve.  ° ° °If you  have any questions or concerns you can call the structural heart phone during normal business hours 8am-4pm. If you have an urgent need after hours or weekends please call [phone] to talk to the on call provider for general cardiology. If you have an emergency that requires immediate attention, please call 911.  ° ° °After TAVR Checklist ° °Check  Test Description  ° Follow up appointment in 1-2 weeks  Most of our patients will see our structural heart physician assistant, [name] [name]. Your incision sites will be checked and you will be cleared to drive and resume all normal activities if you are doing well.    ° 1 month echo and follow up  You will have an echo to check on your new heart valve and be seen back in the office by [name] [name]. Many times the echo is not read by your appointment time, but [name] will call you later that day or the following day to report your results.  ° Follow up with your primary cardiologist You will need to be seen by your primary cardiologist in the following 3-6 months   after your 1 month appointment in the valve clinic. Often times your Plavix or Aspirin will be discontinued during this time, but this is decided on a case by case basis.    1 year echo and follow up You will have another echo to check on your heart valve after 1 year and be seen back in the office by [name] [name]. This your last structural heart visit.   Bacterial endocarditis prophylaxis  You will have to take antibiotics for the rest of your life before all dental procedures (even teeth cleanings) to protect your heart valve. Antibiotics are also required before some surgeries. Please check with your cardiologist before scheduling any surgeries. Also, please make sure to tell us if you have a penicillin allergy as you will require an alternative antibiotic.         Information on my medicine - Coumadin   (Warfarin)  Why was Coumadin prescribed for you? Coumadin was prescribed for you  because you have a blood clot or a medical condition that can cause an increased risk of forming blood clots. Blood clots can cause serious health problems by blocking the flow of blood to the heart, lung, or brain. Coumadin can prevent harmful blood clots from forming. As a reminder your indication for Coumadin is:   Stroke Prevention Because Of Atrial Fibrillation  What test will check on my response to Coumadin? While on Coumadin (warfarin) you will need to have an INR test regularly to ensure that your dose is keeping you in the desired [name]. The INR (international normalized ratio) number is calculated from the result of the laboratory test called prothrombin time (PT).  If an INR APPOINTMENT HAS NOT ALREADY BEEN MADE FOR YOU please schedule an appointment to have this lab work done by your health care provider within 7 days. Your INR goal is usually a number between:  2 to 3 or your provider may give you a more narrow [name] like 2-2.5.  Ask your health care provider during an office visit what your goal INR is.  What  do you need to  know  About  COUMADIN? Take Coumadin (warfarin) exactly as prescribed by your healthcare provider about the same time each day.  DO NOT stop taking without talking to the doctor who prescribed the medication.  Stopping without other blood clot prevention medication to take the place of Coumadin may increase your risk of developing a new clot or stroke.  Get refills before you run out.  What do you do if you miss a dose? If you miss a dose, take it as soon as you remember on the same day then continue your regularly scheduled regimen the next day.  Do not take two doses of Coumadin at the same time.  Important Safety Information A possible side effect of Coumadin (Warfarin) is an increased risk of bleeding. You should call your healthcare provider right away if you experience any of the following: ? Bleeding from an injury or your nose that does not  stop. ? Unusual colored urine (red or dark brown) or unusual colored stools (red or black). ? Unusual bruising for unknown reasons. ? A serious fall or if you hit your head (even if there is no bleeding).  Some foods or medicines interact with Coumadin (warfarin) and might alter your response to warfarin. To help avoid this: ? Eat a balanced diet, maintaining a consistent amount of Vitamin K. ? Notify your provider about major diet changes you plan to make. ?  Avoid alcohol or limit your intake to 1 drink for women and 2 drinks for men per day. (1 drink is 5 oz. wine, 12 oz. beer, or 1.5 oz. liquor.)  Make sure that ANY health care provider who prescribes medication for you knows that you are taking Coumadin (warfarin).  Also make sure the healthcare provider who is monitoring your Coumadin knows when you have started a new medication including herbals and non-prescription products.  Coumadin (Warfarin)  Major Drug Interactions  Increased Warfarin Effect Decreased Warfarin Effect  Alcohol (large quantities) Antibiotics (esp. Septra/Bactrim, Flagyl, Cipro) Amiodarone (Cordarone) Aspirin (ASA) Cimetidine (Tagamet) Megestrol (Megace) NSAIDs (ibuprofen, naproxen, etc.) Piroxicam (Feldene) Propafenone (Rythmol SR) Propranolol (Inderal) Isoniazid (INH) Posaconazole (Noxafil) Barbiturates (Phenobarbital) Carbamazepine (Tegretol) Chlordiazepoxide (Librium) Cholestyramine (Questran) Griseofulvin Oral Contraceptives Rifampin Sucralfate (Carafate) Vitamin K   Coumadin (Warfarin) Major Herbal Interactions  Increased Warfarin Effect Decreased Warfarin Effect  Garlic Ginseng Ginkgo biloba Coenzyme Q10 Green tea St. Johns wort    Coumadin (Warfarin) FOOD Interactions  Eat a consistent number of servings per week of foods HIGH in Vitamin K (1 serving =  cup)  Collards (cooked, or boiled & drained) Kale (cooked, or boiled & drained) Mustard greens (cooked, or boiled &  drained) Parsley *serving size only =  cup Spinach (cooked, or boiled & drained) Swiss chard (cooked, or boiled & drained) Turnip greens (cooked, or boiled & drained)  Eat a consistent number of servings per week of foods MEDIUM-HIGH in Vitamin K (1 serving = 1 cup)  Asparagus (cooked, or boiled & drained) Broccoli (cooked, boiled & drained, or raw & chopped) Brussel sprouts (cooked, or boiled & drained) *serving size only =  cup Lettuce, raw (green leaf, endive, romaine) Spinach, raw Turnip greens, raw & chopped   These websites have more information on Coumadin (warfarin):  FailFactory.se; VeganReport.com.au;

## 2018-09-17 NOTE — Progress Notes (Addendum)
CARDIAC REHAB PHASE I   PRE:  Rate/Rhythm: 81 afib  BP:  Supine:   Sitting: 144/67  Standing:    SaO2: 93%RA  [phone] Came to see pt to walk. He just got washed up. Pt stated he had not eaten yet. Set up breakfast and opened containers for pt. Will return and walk as time permits.  [phone] Came back to walk pt. Pt walked 125 ft on RA with gait belt use and could have walked farther but XR tech here for pt. Pt assisted to wheelchair. Pt needs assistance to stand but walked with rollator with little assistance.    Graylon Good, RN BSN  09/17/2018 9:41 AM

## 2018-09-17 NOTE — Plan of Care (Signed)
  Problem: Education: Goal: Knowledge of General Education information will improve Description Including pain rating scale, medication(s)/side effects and non-pharmacologic comfort measures Outcome: Progressing   Problem: Health Behavior/Discharge Planning: Goal: Ability to manage health-related needs will improve Outcome: Progressing   Problem: Clinical Measurements: Goal: Will remain free from infection Outcome: Progressing   Problem: Activity: Goal: Risk for activity intolerance will decrease Outcome: Progressing   Problem: Coping: Goal: Level of anxiety will decrease Outcome: Progressing   Problem: Safety: Goal: Ability to remain free from injury will improve Outcome: Progressing   Problem: Skin Integrity: Goal: Risk for impaired skin integrity will decrease Outcome: Progressing

## 2018-09-17 NOTE — Care Management Important Message (Signed)
Important Message  Patient Details  Name: [name] [name] MRN: [id number] Date of Birth: 02/04/1925   Medicare Important Message Given:  Yes    Iliana Hutt 09/17/2018, 11:41 AM

## 2018-09-17 NOTE — Progress Notes (Signed)
09/17/2018 1900 Pt discharged to [facility] via [facility]. [place]

## 2018-09-17 NOTE — Progress Notes (Signed)
Physical Therapy Treatment Patient Details Name: [name] [name] MRN: [id number] DOB: [birth year] Today's Date: 09/17/2018    History of Present Illness Pt is a 84 y.o. male admitted 09/14/18 for scheduled TAVR. PMH includes HTN, CAD, CHF, a-fib, RA, CVA, DDD, toe amputation.    PT Comments    Patient received in bed with bed linens soaked from urine. Patient able to transfer with moderate assistance and walker and ambulate to chair with min guard assist. TotalA for hygiene but able to statically stand in order for PT to perform peri care and gown change. Continues to display gross weakness, decreased activity tolerance, and slow gait speed. Continue to recommend SNF for ongoing Physical Therapy.      Follow Up Recommendations  SNF;Supervision for mobility/OOB     Equipment Recommendations  None recommended by PT    Recommendations for Other Services       Precautions / Restrictions Precautions Precautions: Fall Restrictions Weight Bearing Restrictions: No    Mobility  Bed Mobility Overal bed mobility: Needs Assistance Bed Mobility: Supine to Sit     Supine to sit: Min guard     General bed mobility comments: Increased time but no physical assistance required  Transfers Overall transfer level: Needs assistance Equipment used: Rolling walker (2 wheeled) Transfers: Sit to/from Stand Sit to Stand: Max assist;Mod assist         General transfer comment: Initially unable to achieve standing with maxA from lower bed surface. Once bed height was elevated, able to transfer to standing with modA  Ambulation/Gait Ambulation/Gait assistance: Min guard Gait Distance (Feet): 3 Feet Assistive device: Rolling walker (2 wheeled) Gait Pattern/deviations: Shuffle;Trunk flexed Gait velocity: decreased Gait velocity interpretation: <1.31 ft/sec, indicative of household ambulator General Gait Details: Min guard for safety and cues for sequencing. significantly increased time with  slow shuffling gait   Stairs             Wheelchair Mobility    Modified Rankin (Stroke Patients Only)       Balance Overall balance assessment: Needs assistance   Sitting balance-Leahy Scale: Fair     Standing balance support: Bilateral upper extremity supported Standing balance-Leahy Scale: Poor                              Cognition Arousal/Alertness: Awake/alert Behavior During Therapy: WFL for tasks assessed/performed Overall Cognitive Status: Impaired/Different from baseline Area of Impairment: Attention;Following commands;Safety/judgement;Problem solving                   Current Attention Level: Selective   Following Commands: Follows multi-step commands with increased time Safety/Judgement: Decreased awareness of safety;Decreased awareness of deficits   Problem Solving: Requires verbal cues General Comments: Likely baseline cognition. Difficulty multitasking      Exercises      General Comments        Pertinent Vitals/Pain Pain Assessment: No/denies pain    Home Living                      Prior Function            PT Goals (current goals can now be found in the care plan section) Acute Rehab PT Goals Patient Stated Goal: Get stronger at rehab before returning home Potential to Achieve Goals: Good    Frequency    Min 2X/week      PT Plan Current plan remains appropriate    Co-evaluation  AM-PAC PT "6 Clicks" Daily Activity  Outcome Measure  Difficulty turning over in bed (including adjusting bedclothes, sheets and blankets)?: Unable Difficulty moving from lying on back to sitting on the side of the bed? : Unable Difficulty sitting down on and standing up from a chair with arms (e.g., wheelchair, bedside commode, etc,.)?: Unable Help needed moving to and from a bed to chair (including a wheelchair)?: A Little Help needed walking in hospital room?: A Little Help needed climbing  3-5 steps with a railing? : A Lot 6 Click Score: 11    End of Session Equipment Utilized During Treatment: Gait belt Activity Tolerance: Patient tolerated treatment well Patient left: in chair;with call bell/phone within reach Nurse Communication: Mobility status PT Visit Diagnosis: Other abnormalities of gait and mobility (R26.89);Muscle weakness (generalized) (M62.81)     Time: 8329-1916 PT Time Calculation (min) (ACUTE ONLY): 22 min  Charges:  $Therapeutic Activity: 8-22 mins                    Ellamae Sia, PT, DPT Acute Rehabilitation Services Pager [phone] Office [phone]    Willy Eddy 09/17/2018, 5:02 PM

## 2018-09-17 NOTE — Progress Notes (Signed)
09/17/2018 6:33 PM Report given to nurse at [facility]. [place]

## 2018-09-20 ENCOUNTER — Telehealth: Payer: Self-pay

## 2018-09-20 NOTE — Telephone Encounter (Signed)
Left word with the secretary of facility confirming appointment date and time. Instructed to come 15 mins early.

## 2018-09-20 NOTE — Telephone Encounter (Signed)
-----   Message from Eileen Stanford, PA-C sent at 09/20/2018 11:02 AM EDT ----- Regarding: TOC call Hey! This guy went to [facility] and needs a TOC call to remind him about apt with me Wednesday. Can someone call him and do a TOC call today and then route it to me? Thanks!!

## 2018-09-20 NOTE — Telephone Encounter (Signed)
Confirmed with facility that patient is to bring medications to f/u. Facility did not have any further questions or concerns regarding discharge instructions for patient.

## 2018-09-21 DIAGNOSIS — I10 Essential (primary) hypertension: Secondary | ICD-10-CM | POA: Diagnosis not present

## 2018-09-21 DIAGNOSIS — I4811 Longstanding persistent atrial fibrillation: Secondary | ICD-10-CM | POA: Diagnosis not present

## 2018-09-21 DIAGNOSIS — I350 Nonrheumatic aortic (valve) stenosis: Secondary | ICD-10-CM | POA: Diagnosis not present

## 2018-09-21 DIAGNOSIS — I5033 Acute on chronic diastolic (congestive) heart failure: Secondary | ICD-10-CM | POA: Diagnosis not present

## 2018-09-21 NOTE — Progress Notes (Deleted)
[facility]                                       Cardiology Office Note    Date:  09/21/2018   ID:  [name] [name], DOB 10/18/1925, MRN [id number]  PCP:  [name], NP  Cardiologist: Dr. Meda Coffee / Dr. Burt Knack & Dr. Roxy Manns (TAVR)  CC: [facility] s/p TAVR  History of Present Illness:  [name] [name] is a 84 y.o. male with a history of CAD, chronic diastolic CHF, longstanding persistant atrial fibrillation on coumadin, carotid artery disease with CTO RICA, history of TIA, HTN, HLD, hypothyroidism, urinary incontinence and severe AS s/p TAVR (09/14/18) who presents to clinic for follow up.    Echocardiograms have demonstrated the presence of low normal left ventricular systolic function with aortic stenosis that has gradually progressed in severity. Echocardiogram performed 03/02/2018 revealed severe aortic stenosis. Peak velocity across aortic valve measured 4.4 m/s corresponding to mean transvalvular gradient estimated 43 mmHg. The DVI was reportedly only 0.18. Left ventricular systolic function remains stable ~50 to 55%. In 03/2018 he was evaluated in the emergency department with a brief episode of chest pain relieved by nitroglycerin. Troponins were negative. He was seen in follow-up by Truitt Merle in July and subsequently the patient was referred to the multidisciplinary heart valve clinic for evaluation of his aortic stenosis. L/RHC 07/21/18 revealed coronary artery disease with 70% proximal stenosis of the left anterior descending coronary artery and otherwise moderate nonobstructive disease. Right heart pressures and cardiac output were normal. Medical therapy was recommended.  He ultimately underwent successful TAVR with a 29 mm Edwards Sapien 3 THV via the TF approach on 09/14/18. Post operative echoshowed EF 55%, normally functioning TAVR with mean gradient of 8 mm Hg and no PVL. ECG with afib with new LBBB but no high grade heart  block. He was enrolled in the [employer] trial and randomized back to Coumadin. He was discharged on Aspirin and resumed on home coumadin. On the day of discharge he had a worsening cough and wheezing. CXR showed pleural effusions and possible bronchitis. He was treated with IV lasix and oral doxycycline. He was discharged on lasix 75m BID. [facility]   Today he presents to clinic for follow up.   Past Medical History:  Diagnosis Date  . Atrial fibrillation, chronic    a. on coumadin   . Carotid artery occlusion    right total internal artery occlusion  . [place] diastolic [place] heart [place]   . Coronary artery disease    s/p cardiac cath in 1990s, and cardiolite study  in 2005 showing EF 54%  . Degenerative disc disease    cervical spine  . GERD (gastroesophageal reflux disease)   . Hyperlipidemia   . Hypertension   . Hypothyroidism   . [place]oid [place]   . S/P TAVR (transcatheter aortic valve replacement)    Edwards Sapien 3 THV (size 29 mm) via the R TF approach   . Skin cancer    forehead  . Stroke ([facility]    TIA   . TIA (transient ischemic attack)   . Urgency incontinence     Past Surgical History:  Procedure Laterality Date  . AMPUTATION Right 02/21/2015   Procedure: Right 3rd Ray Amputation;  Surgeon: MNewt Minion MD;  [facility]  Service: Orthopedics;  Laterality: Right;  Digital block and ankle block with MAC  . CARDIAC CATHETERIZATION  1990s  . CATARACT EXTRACTION W/ INTRAOCULAR LENS  IMPLANT, BILATERAL    . EYE SURGERY     cataracts bilateral  . INTRAOPERATIVE TRANSTHORACIC ECHOCARDIOGRAM  09/14/2018   Procedure: INTRAOPERATIVE TRANSTHORACIC ECHOCARDIOGRAM;  Surgeon: Sherren Mocha, MD;  Location: [facility];  Service: Open Heart Surgery;;  . RIGHT/LEFT HEART CATH AND CORONARY ANGIOGRAPHY N/A 07/21/2018   Procedure: RIGHT/LEFT HEART CATH AND CORONARY ANGIOGRAPHY;  Surgeon: Sherren Mocha, MD;  Location: [facility];  Service: Cardiovascular;  Laterality: N/A;  . SKIN CANCER EXCISION  12/2011   forehead  . TRANSCATHETER AORTIC VALVE REPLACEMENT, TRANSFEMORAL N/A 09/14/2018   Procedure: TRANSCATHETER AORTIC VALVE REPLACEMENT, TRANSFEMORAL. 67m EDWARDS SAPIEN3 THV.;  Surgeon: CSherren Mocha MD;  [facility] [facility]  Service: Open Heart Surgery;  Laterality: N/A;    Current Medications: Outpatient Medications Prior to Visit  Medication Sig Dispense Refill  . aspirin 81 MG tablet Take 81 mg by mouth at bedtime.     .Marland Kitchenatorvastatin (LIPITOR) 10 MG tablet TAKE 1 TABLET AT BEDTIME 90 tablet 3  . Calcium-Vitamin D (CALTRATE 600 PLUS-VIT D PO) Take 1 tablet by mouth at bedtime.     . docusate sodium (COLACE) 100 MG capsule Take 1 capsule (100 mg total) by mouth daily as needed for mild constipation. 30 capsule 6  . doxycycline (VIBRAMYCIN) 100 MG capsule Take 1 capsule (100 mg total) by mouth 2 (two) times daily. 10 capsule 0  . folic acid (FOLVITE) 1 MG tablet Take 1 mg by mouth daily.     . furosemide (LASIX) 40 MG tablet Take 1 tablet (40 mg total) by mouth 2 (two) times daily. 30 tablet 6  . guaiFENesin (MUCINEX) 600 MG 12 hr tablet Take 600-1,200 mg by mouth 2 (two) times daily as needed (cold/congestion.).    .Marland Kitchenmethotrexate (RHEUMATREX) 2.5 MG tablet Take 10 mg by mouth once a week. Caution:Chemotherapy. Protect from light.    . metolazone (ZAROXOLYN) 2.5 MG tablet Take 1 tablet (2.5 mg total) by mouth as needed. As needed for weight gain >3 lbs or worsening swelling. Take extra potassium with Metolazone 36 tablet 3  . Multiple Vitamin (MULTIVITAMIN WITH MINERALS) TABS tablet Take 1 tablet by mouth daily.    . nitroGLYCERIN (NITROSTAT) 0.4 MG SL tablet Place 1 tablet (0.4 mg total) under the tongue every 5 (five) minutes as needed for chest pain. 25 tablet 0  . potassium chloride SA (K-DUR,KLOR-CON) 20 MEQ tablet Take 1 tablet (20 mEq total) by mouth 2 (two) times daily. 30 tablet 6  .  Probiotic Product (HEALTHY COLON PO) Take 1 capsule by mouth daily.    . tamsulosin (FLOMAX) 0.4 MG CAPS capsule Take 0.4 mg by mouth every evening.    . TOPROL XL 25 MG 24 hr tablet TAKE ONE-HALF TABLET (12.5 MG TOTAL) DAILY WITH OR IMMEDIATELY FOLLOWING A MEAL (DOSE CHANGE) (Patient taking differently: Take 12.5 mg by mouth daily. ) 45 tablet 2  . warfarin (COUMADIN) 5 MG tablet Take 1 tablet daily except 1 1/2 Mon Wed Fri or TAKE AS DIRECTED BY ANTICOAGULATION CLINIC (Patient taking differently: Take 5-7.5 mg by mouth See admin instructions. Take 1.5 tablets (7.5 mg) by mouth on Mondays, Wednesdays, & Fridays. Take 1 tablet (5 mg) by mouth on Sundays, Tuesdays, Thursdays, & Saturdays.) 120 tablet 1   No facility-administered medications prior to visit.      Allergies:  Levofloxacin and Klor-con [potassium chloride er]   Social History   Socioeconomic History  . Marital status: Married    Spouse name: Not on file  . Number of children: Not on file  . Years of education: Not on file  . Highest education level: Not on file  Occupational History  . Not on file  Social Needs  . Financial resource strain: Not on file  . Food insecurity:    Worry: Not on file    Inability: Not on file  . Transportation needs:    Medical: Not on file    Non-medical: Not on file  Tobacco Use  . Smoking status: Former Smoker    Packs/day: 1.00    Years: 40.00    Pack years: 40.00    Types: Cigarettes    Last attempt to quit: 09/12/1989    Years since quitting: 29.0  . Smokeless tobacco: Never Used  . Tobacco comment: quit around 1992  Substance and Sexual Activity  . Alcohol use: No    Alcohol/week: 0.0 standard drinks    Comment: former, quit drinking in 1992  . Drug use: No  . Sexual activity: Not Currently  Lifestyle  . Physical activity:    Days per week: Not on file    Minutes per session: Not on file  . Stress: Not on file  Relationships  . Social connections:    Talks on phone:  Not on file    Gets together: Not on file    Attends religious service: Not on file    Active member of club or organization: Not on file    Attends meetings of clubs or organizations: Not on file    Relationship status: Not on file  Other Topics Concern  . 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      *** ROS/PE    Wt Readings from Last 3 Encounters:  09/17/18 188 lb 3.2 oz (85.4 kg)  09/10/18 182 lb 12.8 oz (82.9 kg)  08/23/18 179 lb (81.2 kg)      Studies/Labs Reviewed:   EKG:  EKG is*** ordered today.  The ekg ordered today demonstrates ***  Recent Labs: 03/02/2018: NT-Pro BNP 2,507 07/14/2018: TSH 5.28 09/10/2018: ALT 14; B Natriuretic Peptide 256.7 09/15/2018: Magnesium 1.8 09/17/2018: BUN 16; Creatinine, Ser 1.16; Hemoglobin 8.0; Platelets 128; Potassium 3.4; Sodium 133   Lipid Panel    Component Value Date/Time   CHOL 106 05/23/2015 0958   TRIG 89.0 05/23/2015 0958   TRIG 25 03/31/2010   HDL 34.40 (L) 05/23/2015 0958   CHOLHDL 3 05/23/2015 0958   VLDL 17.8 05/23/2015 0958   LDLCALC 54 05/23/2015 0958    Additional studies/ records that were reviewed today include:  TAVR OPERATIVE NOTE   Date of Procedure:09/14/2018  Preoperative Diagnosis:Severe Aortic Stenosis   Postoperative Diagnosis:Same   Procedure:   Transcatheter Aortic Valve Replacement - PercutaneousRightTransfemoral Approach Edwards Sapien 3 THV (size 57m, model # 9600TFX, serial # [id number]  Co-Surgeons:Michael CBurt Knack MD  andClarence H. ORoxy Manns MD    Pre-operative Echo Findings: ? Severe aortic stenosis ? Normalleft  ventricular systolic function  Post-operative Echo Findings: ? Noparavalvular leak ? Unchangedleft ventricular systolic function  __________________  Post operative echo 09/15/18: Study Conclusions - Left ventricle: The cavity size was normal. Wall thickness was increased in a pattern of mild LVH. Systolic function was normal. The estimated ejection fraction was in the range of 55% to 65%. Wall motion was normal; there were no regional wall motion abnormalities. - Aortic valve: A bioprosthesis was present. Valve area (VTI): 2.35 cm^2. Valve area (Vmax): 2.53 cm^2. Valve area (Vmean): 2.4 cm^2. - Mitral valve: There was mild regurgitation. - Left atrium: The atrium was mildly dilated. - Right atrium: The atrium was moderately dilated. - Pulmonary arteries: Systolic pressure was moderately increased. PA peak pressure: 52 mm Hg (S).    ASSESSMENT & PLAN:   Severe AS s/p TAVR:  HTN:   Chronic afib:  Chronic diastolic CHF:   Hypokalemia:BMET today  CAD: pre TAVR cath showed calcified coronaries with mild non obst CAD and mod CAD in LAD (70% pLAD stenosis). Medical therapy was recommended.  Cough:    Medication Adjustments/Labs and Tests Ordered: Current medicines are reviewed at length with the patient today.  Concerns regarding medicines are outlined above.  Medication changes, Labs and Tests ordered today are listed in the Patient Instructions below. There are no Patient Instructions on file for this visit.   Signed, Angelena Form, PA-C  09/21/2018 9:14 PM    [facility] [address], [place], [place]  [address] Phone: [phone]; Fax: [phone]

## 2018-09-22 ENCOUNTER — Ambulatory Visit: Payer: Medicare Other | Admitting: Physician Assistant

## 2018-09-22 DIAGNOSIS — E039 Hypothyroidism, unspecified: Secondary | ICD-10-CM | POA: Diagnosis not present

## 2018-09-22 DIAGNOSIS — J449 Chronic obstructive pulmonary disease, unspecified: Secondary | ICD-10-CM | POA: Diagnosis not present

## 2018-09-22 DIAGNOSIS — I509 Heart failure, unspecified: Secondary | ICD-10-CM | POA: Diagnosis not present

## 2018-09-22 DIAGNOSIS — I110 Hypertensive heart disease with heart failure: Secondary | ICD-10-CM | POA: Diagnosis not present

## 2018-09-22 DIAGNOSIS — Q253 Supravalvular aortic stenosis: Secondary | ICD-10-CM | POA: Diagnosis not present

## 2018-09-22 DIAGNOSIS — I6529 Occlusion and stenosis of unspecified carotid artery: Secondary | ICD-10-CM | POA: Diagnosis not present

## 2018-09-22 DIAGNOSIS — I4891 Unspecified atrial fibrillation: Secondary | ICD-10-CM | POA: Diagnosis not present

## 2018-09-27 DIAGNOSIS — J449 Chronic obstructive pulmonary disease, unspecified: Secondary | ICD-10-CM | POA: Diagnosis not present

## 2018-09-27 DIAGNOSIS — I5033 Acute on chronic diastolic (congestive) heart failure: Secondary | ICD-10-CM | POA: Diagnosis not present

## 2018-09-27 DIAGNOSIS — I4811 Longstanding persistent atrial fibrillation: Secondary | ICD-10-CM | POA: Diagnosis not present

## 2018-09-27 DIAGNOSIS — I350 Nonrheumatic aortic (valve) stenosis: Secondary | ICD-10-CM | POA: Diagnosis not present

## 2018-09-29 ENCOUNTER — Other Ambulatory Visit: Payer: Self-pay | Admitting: *Deleted

## 2018-09-29 NOTE — Patient Outreach (Addendum)
[facility]) Care Management  09/29/2018  [name] May 27, 1925 [id number]   Onsite meeting at facility. [name], SW reports patient lives alone. He is being weaned off his oxygen at this time. Possible discharge next week.   Attempted to meet with patient, he was at therapy.  Plan to follow up at next facility visit if indicated. Royetta Crochet. Laymond Purser, RN, BSN, [place] [phone]) Business Cell  [phone]) Toll Free Office

## 2018-09-30 ENCOUNTER — Telehealth: Payer: Self-pay | Admitting: Hematology

## 2018-09-30 NOTE — Telephone Encounter (Signed)
GK out 11/14 - per [facility] move lab/fu four weeks out. Also per desk nurse receive call from patient family re cancelling 11/14 appointments due to patient in rehab. Left message re lab/fu 12/12 and mailed schedule.

## 2018-10-04 DIAGNOSIS — I4811 Longstanding persistent atrial fibrillation: Secondary | ICD-10-CM | POA: Diagnosis not present

## 2018-10-04 DIAGNOSIS — J449 Chronic obstructive pulmonary disease, unspecified: Secondary | ICD-10-CM | POA: Diagnosis not present

## 2018-10-04 DIAGNOSIS — I5033 Acute on chronic diastolic (congestive) heart failure: Secondary | ICD-10-CM | POA: Diagnosis not present

## 2018-10-04 DIAGNOSIS — I350 Nonrheumatic aortic (valve) stenosis: Secondary | ICD-10-CM | POA: Diagnosis not present

## 2018-10-07 ENCOUNTER — Ambulatory Visit: Payer: Medicare Other | Admitting: Hematology

## 2018-10-07 ENCOUNTER — Other Ambulatory Visit: Payer: Self-pay | Admitting: *Deleted

## 2018-10-07 ENCOUNTER — Other Ambulatory Visit: Payer: Medicare Other

## 2018-10-07 NOTE — Progress Notes (Addendum)
HE[facility] AND [place]                                       Cardiology Office Note    Date:  10/14/2018   ID:  [name] [name], DOB [birth year], MRN [id number] PCP:  [name], NP  Cardiologist:  Dr. Meda Coffee / Dr. Burt Knack & Dr. Roxy Manns (TAVR)   CC: 1 month s/p TAVR  History of Present Illness:  [name] [name] is a 84 y.o. male with a history of CAD, chronic diastolic CHF, longstanding persistant atrial fibrillation on coumadin, carotid artery disease with CTO RICA, history of TIA, HTN, HLD, hypothyroidism, urinary incontinence and severe AS s/p TAVR (09/14/18) who presents to clinic for follow up.      Echocardiograms have demonstrated the presence of low normal left ventricular systolic function with aortic stenosis that has gradually progressed in severity. Echocardiogram performed 03/02/2018 revealed severe aortic stenosis. Peak velocity across aortic valve measured 4.4 m/s corresponding to mean transvalvular gradient estimated 43 mmHg. The DVI was reportedly only 0.18. Left ventricular systolic function remained stable ~50 to 55%. In 03/2018 he was evaluated in the emergency department with a brief episode of chest pain relieved by nitroglycerin. Troponins were negative. He was seen in follow-up by Truitt Merle in July and subsequently the patient was referred to the multidisciplinary heart valve clinic for evaluation of his aortic stenosis. L/RHC 07/21/18 revealed coronary artery disease with 70% proximal stenosis of the left anterior descending coronary artery and otherwise moderate nonobstructive disease. Right heart pressures and cardiac output were normal. Medical therapy was recommended.    He ultimately underwent successful TAVR with a 29 mm Edwards Sapien 3 THV via the TF approach on 09/14/18. Post operative echoshowed EF 55%, normally functioning TAVR with mean gradient of 8 mm Hg and no PVL. ECG with afib with new LBBB but no  high grade heart block. He was enrolled in the [employer] trial and randomized back to Coumadin. He was discharged on Aspirin and resumed on home coumadin. On the day of discharge he had a worsening cough and wheezing. CXR showed pleural effusions and possible bronchitis. He was treated with IV lasix and oral doxycycline. He was discharged on lasix 6m BID. [facility]    Today he presents to clinic for follow up. No CP or SOB. No LE edema, orthopnea or PND. He had one episode of dizziness but no syncope. No blood in stool or urine. No palpitations. Still has a little cough, but it's much better. He was just discharged from [facility] on Tuesday. He has not been walking as much since being home but hoping to resume home PT. His LE edema has improved significantly since having his surgery; he can finally see his ankles. He doesn't like taking lasix twice a day because it keeps him up at night urinating.    Past Medical History:  Diagnosis Date  . Atrial fibrillation, chronic    a. on coumadin   . Carotid artery occlusion    right total internal artery occlusion  . Chronic diastolic CHF (congestive heart failure) ([place]   . Coronary artery disease    s/p cardiac cath in 1990s, and cardiolite study  in 2005 showing EF 54%  . Degenerative disc disease    cervical spine  . GERD (gastroesophageal reflux disease)   .  Hyperlipidemia   . Hypertension   . Hypothyroidism   . Rheumatoid arthritis ([place])   . S/P TAVR (transcatheter aortic valve replacement)    Edwards Sapien 3 THV (size 29 mm) via the R TF approach   . Skin cancer    forehead  . Stroke [facility])    TIA   . TIA (transient ischemic attack)   . Urgency incontinence     Past Surgical History:  Procedure Laterality Date  . AMPUTATION Right 02/21/2015   Procedure: Right 3rd Ray Amputation;  Surgeon: Newt Minion, MD;  Location: [place];  Service: Orthopedics;  Laterality: Right;  Digital block and ankle block  with MAC  . CARDIAC CATHETERIZATION  1990s  . CATARACT EXTRACTION W/ INTRAOCULAR LENS  IMPLANT, BILATERAL    . EYE SURGERY     cataracts bilateral  . INTRAOPERATIVE TRANSTHORACIC ECHOCARDIOGRAM  09/14/2018   Procedure: INTRAOPERATIVE TRANSTHORACIC ECHOCARDIOGRAM;  Surgeon: Sherren Mocha, MD;  Location: [place];  Service: Open Heart Surgery;;  . RIGHT/LEFT HEART CATH AND CORONARY ANGIOGRAPHY N/A 07/21/2018   Procedure: RIGHT/LEFT HEART CATH AND CORONARY ANGIOGRAPHY;  Surgeon: Sherren Mocha, MD;  Location: [facility];  Service: Cardiovascular;  Laterality: N/A;  . SKIN CANCER EXCISION  12/2011   forehead  . TRANSCATHETER AORTIC VALVE REPLACEMENT, TRANSFEMORAL N/A 09/14/2018   Procedure: TRANSCATHETER AORTIC VALVE REPLACEMENT, TRANSFEMORAL. 23m EDWARDS SAPIEN3 THV.;  Surgeon: CSherren Mocha MD;  [facility]  Service: Open Heart Surgery;  Laterality: N/A;    Current Medications: Outpatient Medications Prior to Visit  Medication Sig Dispense Refill  . aspirin 81 MG tablet Take 81 mg by mouth at bedtime.    .Marland Kitchenatorvastatin (LIPITOR) 10 MG tablet TAKE 1 TABLET AT BEDTIME 90 tablet 3  . Calcium-Vitamin D (CALTRATE 600 PLUS-VIT D PO) Take 1 tablet by mouth at bedtime.     . docusate sodium (COLACE) 100 MG capsule Take 1 capsule (100 mg total) by mouth daily as needed for mild constipation. 30 capsule 6  . doxycycline (VIBRAMYCIN) 100 MG capsule Take 1 capsule (100 mg total) by mouth 2 (two) times daily. 10 capsule 0  . folic acid (FOLVITE) 1 MG tablet Take 1 mg by mouth daily.     .Marland KitchenguaiFENesin (MUCINEX) 600 MG 12 hr tablet Take 600-1,200 mg by mouth 2 (two) times daily as needed (cold/congestion.).    .Marland Kitchenmethotrexate (RHEUMATREX) 2.5 MG tablet Take 10 mg by mouth once a week. Caution:Chemotherapy. Protect from light.    . metolazone (ZAROXOLYN) 2.5 MG tablet Take 1 tablet (2.5 mg total) by mouth as needed. As needed for weight gain >3 lbs or worsening swelling. Take extra potassium  with Metolazone 36 tablet 3  . Multiple Vitamin (MULTIVITAMIN WITH MINERALS) TABS tablet Take 1 tablet by mouth daily.    . nitroGLYCERIN (NITROSTAT) 0.4 MG SL tablet Place 1 tablet (0.4 mg total) under the tongue every 5 (five) minutes as needed for chest pain. 25 tablet 0  . Probiotic Product (HEALTHY COLON PO) Take 1 capsule by mouth daily.    . tamsulosin (FLOMAX) 0.4 MG CAPS capsule Take 0.4 mg by mouth every evening.    . TOPROL XL 25 MG 24 hr tablet TAKE ONE-HALF TABLET (12.5 MG TOTAL) DAILY WITH OR IMMEDIATELY FOLLOWING A MEAL (DOSE CHANGE) 45 tablet 2  . warfarin (COUMADIN) 5 MG tablet Take 1 tablet daily except 1 1/2 Mon Wed Fri or TAKE AS DIRECTED BY ANTICOAGULATION CLINIC (Patient taking differently: Take 5-7.5 mg by mouth  See admin instructions. Take 1.5 tablets (7.5 mg) by mouth on Mondays, Wednesdays, & Fridays. Take 1 tablet (5 mg) by mouth on Sundays, Tuesdays, Thursdays, & Saturdays.) 120 tablet 1  . furosemide (LASIX) 40 MG tablet Take 1 tablet (40 mg total) by mouth 2 (two) times daily. 30 tablet 6  . potassium chloride SA (K-DUR,KLOR-CON) 20 MEQ tablet Take 1 tablet (20 mEq total) by mouth 2 (two) times daily. (Patient not taking: Reported on 10/14/2018) 30 tablet 6   No facility-administered medications prior to visit.      Allergies:   Levofloxacin and Klor-con [potassium chloride er]   Social History   Socioeconomic History  . Marital status: Married    Spouse name: Not on file  . Number of children: Not on file  . Years of education: Not on file  . Highest education level: Not on file  Occupational History  . Not on file  Social Needs  . Financial resource strain: Not on file  . Food insecurity:    Worry: Not on file    Inability: Not on file  . Transportation needs:    Medical: Not on file    Non-medical: Not on file  Tobacco Use  . Smoking status: Former Smoker    Packs/day: 1.00    Years: 40.00    Pack years: 40.00    Types: Cigarettes    Last  attempt to quit: 09/12/1989    Years since quitting: 29.1  . Smokeless tobacco: Never Used  . Tobacco comment: quit around 1992  Substance and Sexual Activity  . Alcohol use: No    Alcohol/week: 0.0 standard drinks    Comment: former, quit drinking in 1992  . Drug use: No  . Sexual activity: Not Currently  Lifestyle  . Physical activity:    Days per week: Not on file    Minutes per session: Not on file  . Stress: Not on file  Relationships  . Social connections:    Talks on phone: Not on file    Gets together: Not on file    Attends religious service: Not on file    Active member of club or organization: Not on file    Attends meetings of clubs or organizations: Not on file    Relationship status: Not on file  Other Topics Concern  . 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      ROS:   Please see the history of present illness.    ROS All other systems reviewed and are negative.   PHYSICAL EXAM:   VS:  BP 104/68   Pulse 67   Ht _0  (1.803 m)   Wt 165 lb (74.8 kg)   SpO2 93%   BMI 23.01 kg/m    GEN: Well nourished, well developed, in no acute distress HEENT: normal Neck: no JVD or masses Cardiac: irreg irreg; soft murmur. no rubs, or gallops, 1+ bilateral LE edema with chronic venous stasis changes.  Respiratory:  clear to auscultation bilaterally, normal work of breathing GI: soft, nontender, nondistended, + BS MS: no deformity or  atrophy Skin: warm and dry, no rash Neuro:  Alert and Oriented x 3, Strength and sensation are intact Psych: euthymic mood, full affect   Wt  Readings from Last 3 Encounters:  10/14/18 165 lb (74.8 kg)  09/17/18 188 lb 3.2 oz (85.4 kg)  09/10/18 182 lb 12.8 oz (82.9 kg)     Studies/Labs Reviewed:   EKG:  EKG is ordered today.  The ekg ordered today demonstrates aifb with PVCs, HR 67, LBBB  Recent Labs: 03/02/2018: NT-Pro BNP 2,507 07/14/2018: TSH 5.28 09/10/2018: ALT 14; B Natriuretic Peptide 256.7 09/15/2018: Magnesium 1.8 09/17/2018: BUN 16; Creatinine, Ser 1.16; Hemoglobin 8.0; Platelets 128; Potassium 3.4; Sodium 133   Lipid Panel    Component Value Date/Time   CHOL 106 05/23/2015 0958   TRIG 89.0 05/23/2015 0958   TRIG 25 03/31/2010   HDL 34.40 (L) 05/23/2015 0958   CHOLHDL 3 05/23/2015 0958   VLDL 17.8 05/23/2015 0958   LDLCALC 54 05/23/2015 0958    Additional studies/ records that were reviewed today include:   TAVR OPERATIVE NOTE   Date of Procedure:09/14/2018    Preoperative Diagnosis:Severe Aortic Stenosis    Postoperative Diagnosis:Same    Procedure:     Transcatheter Aortic Valve Replacement - PercutaneousRightTransfemoral Approach  Edwards Sapien 3 THV (size 75m, model # 9600TFX, serial # [id number]    Co-Surgeons:Michael CBurt Knack MD andClarence H. ORoxy Manns MD      Pre-operative Echo Findings:  - Severe aortic stenosis  - Normalleft ventricular systolic function    Post-operative Echo Findings:  - Noparavalvular leak  - Unchangedleft ventricular systolic function    __________________    Post operative echo 09/15/18:  Study Conclusions  - Left ventricle: The cavity size was normal. Wall thickness was  increased in a pattern of mild LVH. Systolic function was normal.  The estimated ejection fraction was in the range of 55% to 65%.  Wall motion was normal; there were no regional wall motion  abnormalities.  - Aortic valve: A bioprosthesis was present. Valve area (VTI): 2.35  cm^2. Valve area  (Vmax): 2.53 cm^2. Valve area (Vmean): 2.4 cm^2.  - Mitral valve: There was mild regurgitation.  - Left atrium: The atrium was mildly dilated.  - Right atrium: The atrium was moderately dilated.  - Pulmonary arteries: Systolic pressure was moderately increased.  PA peak pressure: 52 mm Hg (S).    ________________  Echo 10/14/18 Study Conclusions  - Left ventricle: The cavity size was normal. Wall thickness was   increased in a pattern of mild LVH. Systolic function was   severely reduced. The estimated ejection fraction was in the   range of 25% to 30%. - Aortic valve: A bioprosthesis was present. There was trivial   regurgitation. Valve area (VTI): 2.43 cm^2. Valve area (Vmax):   2.31 cm^2. Valve area (Vmean): 2.5 cm^2. - Mitral valve: There was mild to moderate regurgitation. - Left atrium: The atrium was severely dilated. - Right ventricle: The cavity size was mildly dilated. - Right atrium: The atrium was severely dilated. - Tricuspid valve: There was mild-moderate regurgitation.  ASSESSMENT & PLAN:   Severe AS s/p TAVR: echo today shows a significant drop in his LVEF from 55-65% to 25-30% with a normally functioning TAVR with mean gradient of  6 mm Hg and trivial PVL.  He has NYHA class I symptoms and doing significantly better since TAVR. SBE prophylaxis discussed; the patient is edentulous and does not go to the dentist. ASA can be discontinued after 6 months  of therapy 02/2019.    New LV dysfunction: will have Dr. Burt Knack and Dr. Meda Coffee review echo. Peri procedural echo reported EF of 45%, but otherwise Ef has been in range of 50-65%. His volume status looks the best it has in months (LE edema much improved). Continue on Toprol XL 12.5 mg daily. BP on soft side, so not sure he would tolerate additional CHF medications.   HTN: BP well controlled today.    Chronic afib: rate well controlled. Continue coumadin for thromboembolic prophylaxis.   CAD: pre TAVR cath showed  calcified coronaries with mild non obst CAD and mod CAD in LAD (70% pLAD stenosis). Medical therapy was recommended.    Deconditioning: he was working with [place]. He is nervous to walk by himself and would benefit from more therapy. We will order this to be restarted.    Medication Adjustments/Labs and Tests Ordered: Current medicines are reviewed at length with the patient today.  Concerns regarding medicines are outlined above.  Medication changes, Labs and Tests ordered today are listed in the Patient Instructions below. Patient Instructions  Medication Instructions:  Your physician has recommended you make the following change in your medication:   STOP Aspirin March 16, 2019 CHANGE Lasix (Furosemide) to 80 mg every morning CHANGE Kdur (Potassium) to 40 mEq every morning   Labwork: None Ordered   Testing/Procedures: None Ordered   Follow-Up: Your physician recommends that you schedule a follow-up appointment in:3 months with Dr. Meda Coffee   If you need a refill on your cardiac medications before your next appointment, please call your pharmacy.   Thank you for choosing [facility]! Christen Bame, RN [phone]       Signed, Angelena Form, PA-C  10/14/2018 7:55 PM    [facility] [address] [address], [place], [place]  [address] Phone: [phone]; Fax: [phone]

## 2018-10-07 NOTE — Patient Outreach (Signed)
[name] [name]) Care [name]  10/07/2018  [name] [name] 23-Jan-1925 [id number]   Onsite visit to [name] [name]. Update from [name], [name] that [name] will discharge 11/19. [name] [name], SW at facility reports [name] to discharge home with home care, she was going to refer to [name] [name] reminded about in network contracted home care providers.   Met with [name] at the bedside. He was alert and oriented. He states he lives alone.  He has supportive children but they live out of town. He states his daughters [name] [name] and [name] [name] are his POA. He has 4 daughters. His wife of 79 years is in a memory care facility in [name].   He states that they provide transportation to medical appointments and he does not drive He gets his medications from [name] [name] and his daughters fill a med planner for him. He has a housekeeper on Wed and an aide comes by in the evening to help with a meal.  [name] has a lifeline call button that he wears. He says he has had one fall and had to call EMS.  [name] reports he has Atrial fibrillation, HTN, COMP, HF, HLD, aortic stenosis.    RNCM reviewed [name] [name] and the differences of HH and Care [name].  [name] gives verbal consent. [name] reports to call him and if he does not answer to call back.  He states his phone number is [phone], his healthcare provider is [name] Dinning, NP   Plan to refer to [name] [name] for transition of care Will collaborate with [name] [name]. [name] Purser, RN, BSN, [name] [name] [phone]) Business Cell  [phone]) Toll Free Office

## 2018-10-12 DIAGNOSIS — I5033 Acute on chronic diastolic (congestive) heart failure: Secondary | ICD-10-CM | POA: Diagnosis not present

## 2018-10-12 DIAGNOSIS — I2510 Atherosclerotic heart disease of native coronary artery without angina pectoris: Secondary | ICD-10-CM | POA: Diagnosis not present

## 2018-10-12 DIAGNOSIS — I350 Nonrheumatic aortic (valve) stenosis: Secondary | ICD-10-CM | POA: Diagnosis not present

## 2018-10-12 DIAGNOSIS — I4811 Longstanding persistent atrial fibrillation: Secondary | ICD-10-CM | POA: Diagnosis not present

## 2018-10-13 ENCOUNTER — Telehealth: Payer: Self-pay | Admitting: Adult Health

## 2018-10-13 ENCOUNTER — Other Ambulatory Visit: Payer: Self-pay | Admitting: *Deleted

## 2018-10-13 NOTE — Telephone Encounter (Signed)
Copied from [place] [id number]. Topic: Quick Communication - See Telephone Encounter >> Oct 13, 2018 12:08 PM Bea Graff, NT wrote: CRM for notification. See Telephone encounter for: 10/13/18. Pt states that once he went home he was told his home health would start back up, but he has not heard anything. Pt is wanting to make [facility] aware that he came home yesterday and he wanting to see when the home health nurse may start coming back out?

## 2018-10-13 NOTE — Telephone Encounter (Signed)
Spoke to the pt.  Informed him that his home health was ordered while he was in the hospital.  I am unsure when they will becoming to start services but to be patient.  It may take several days or up to a week.  He has an appt at cardiology tomorrow on 10/14/18.

## 2018-10-13 NOTE — Patient Outreach (Signed)
[facility]) Care Management  10/13/2018  [name] [name] [birth year] [id number]   Transition of care post SNF discharge  RN initial outreach attempt unsuccessful however RN able to leave a HIPAA approved voice message requesting a call back. Will further engage at that time. Will schedued another follow up call over the next week.  Raina Mina, RN Care Management Coordinator [place] Office [phone]

## 2018-10-14 ENCOUNTER — Other Ambulatory Visit: Payer: Self-pay

## 2018-10-14 ENCOUNTER — Ambulatory Visit (HOSPITAL_COMMUNITY): Payer: Medicare Other | Attending: Cardiovascular Disease

## 2018-10-14 ENCOUNTER — Ambulatory Visit (INDEPENDENT_AMBULATORY_CARE_PROVIDER_SITE_OTHER): Payer: Medicare Other | Admitting: Physician Assistant

## 2018-10-14 ENCOUNTER — Other Ambulatory Visit: Payer: Self-pay | Admitting: Physician Assistant

## 2018-10-14 VITALS — BP 104/68 | HR 67 | Ht 71.0 in | Wt 165.0 lb

## 2018-10-14 DIAGNOSIS — I4821 Permanent atrial fibrillation: Secondary | ICD-10-CM | POA: Diagnosis not present

## 2018-10-14 DIAGNOSIS — I2510 Atherosclerotic heart disease of native coronary artery without angina pectoris: Secondary | ICD-10-CM | POA: Diagnosis not present

## 2018-10-14 DIAGNOSIS — Z952 Presence of prosthetic heart valve: Secondary | ICD-10-CM

## 2018-10-14 DIAGNOSIS — I5042 Chronic combined systolic (congestive) and diastolic (congestive) heart failure: Secondary | ICD-10-CM | POA: Diagnosis not present

## 2018-10-14 DIAGNOSIS — I10 Essential (primary) hypertension: Secondary | ICD-10-CM

## 2018-10-14 MED ORDER — FUROSEMIDE 40 MG PO TABS: 80.0000 mg | ORAL_TABLET | Freq: Every morning | ORAL | 3 refills | Status: DC

## 2018-10-14 MED ORDER — POTASSIUM CHLORIDE CRYS ER 20 MEQ PO TBCR: 40.0000 meq | EXTENDED_RELEASE_TABLET | Freq: Every morning | ORAL | 3 refills | Status: DC

## 2018-10-14 NOTE — Patient Instructions (Addendum)
Medication Instructions:  Your physician has recommended you make the following change in your medication:   STOP Aspirin March 16, 2019 CHANGE Lasix (Furosemide) to 80 mg every morning CHANGE Kdur (Potassium) to 40 mEq every morning   Labwork: None Ordered   Testing/Procedures: None Ordered   Follow-Up: Your physician recommends that you schedule a follow-up appointment in:3 months with Dr. Meda Coffee   If you need a refill on your cardiac medications before your next appointment, please call your pharmacy.   Thank you for choosing [facility]! Christen Bame, RN [phone]

## 2018-10-14 NOTE — Telephone Encounter (Addendum)
[place] with [facility] is calling to let cory know they will  see pt on 10-17-18 for skilled nurse

## 2018-10-14 NOTE — Telephone Encounter (Signed)
Noted  

## 2018-10-17 DIAGNOSIS — R1311 Dysphagia, oral phase: Secondary | ICD-10-CM | POA: Diagnosis not present

## 2018-10-17 DIAGNOSIS — I252 Old myocardial infarction: Secondary | ICD-10-CM | POA: Diagnosis not present

## 2018-10-17 DIAGNOSIS — Z7901 Long term (current) use of anticoagulants: Secondary | ICD-10-CM | POA: Diagnosis not present

## 2018-10-17 DIAGNOSIS — Z8673 Personal history of transient ischemic attack (TIA), and cerebral infarction without residual deficits: Secondary | ICD-10-CM | POA: Diagnosis not present

## 2018-10-17 DIAGNOSIS — J449 Chronic obstructive pulmonary disease, unspecified: Secondary | ICD-10-CM | POA: Diagnosis not present

## 2018-10-17 DIAGNOSIS — I5033 Acute on chronic diastolic (congestive) heart failure: Secondary | ICD-10-CM | POA: Diagnosis not present

## 2018-10-17 DIAGNOSIS — I2510 Atherosclerotic heart disease of native coronary artery without angina pectoris: Secondary | ICD-10-CM | POA: Diagnosis not present

## 2018-10-17 DIAGNOSIS — I447 Left bundle-branch block, unspecified: Secondary | ICD-10-CM | POA: Diagnosis not present

## 2018-10-17 DIAGNOSIS — Z952 Presence of prosthetic heart valve: Secondary | ICD-10-CM | POA: Diagnosis not present

## 2018-10-17 DIAGNOSIS — Z5181 Encounter for therapeutic drug level monitoring: Secondary | ICD-10-CM | POA: Diagnosis not present

## 2018-10-17 DIAGNOSIS — I4811 Longstanding persistent atrial fibrillation: Secondary | ICD-10-CM | POA: Diagnosis not present

## 2018-10-17 DIAGNOSIS — E785 Hyperlipidemia, unspecified: Secondary | ICD-10-CM | POA: Diagnosis not present

## 2018-10-17 DIAGNOSIS — Z8701 Personal history of pneumonia (recurrent): Secondary | ICD-10-CM | POA: Diagnosis not present

## 2018-10-17 DIAGNOSIS — I110 Hypertensive heart disease with heart failure: Secondary | ICD-10-CM | POA: Diagnosis not present

## 2018-10-18 ENCOUNTER — Telehealth: Payer: Self-pay | Admitting: Nurse Practitioner

## 2018-10-18 ENCOUNTER — Telehealth: Payer: Self-pay | Admitting: Adult Health

## 2018-10-18 DIAGNOSIS — Z952 Presence of prosthetic heart valve: Secondary | ICD-10-CM

## 2018-10-18 NOTE — Research (Signed)
[id number]  MOBILITY:    I HAVE NO PROBLEMS WALKING []   I HAVE SLIGHT PROBLEMS WALKING [x]   I HAVE MODERATE PROBLEMS WALKING []   I HAVE SEVERE PROBLEMS WALKING []   I AM UNABLE TO WALK  []     SELF-CARE:   I HAVE NO PROBLEMS WASING OR DRESSING MYSELF  [x]   I HAVE SLIGHT PROBLEMS WASHING OR DRESSING MYSELF  []   I HAVE MODERATE PROBLEMS WASHING OR DRESSING MYSELF []   I HAVE SEVERE PROBLEMS WASHING OR DRESSING MYSELF  []   I HAVE SEVERE PROBLEMS WASHING OR DRESSING MYSELF  []   I AM UNABLE TO WASH OR DRESS MYSELF []     USUAL ACTIVITIES: (E.G. WORK/STUDY/HOUSEWORK/FAMILY OR LEISURE ACTIVITIES.    I HAVE NO PROBLEMS DOING MY USUAL ACTIVITIES [x]   I HAVE SLIGHT PROBLEMS DOING MY USUAL ACTIVITIES []   I HAVE MODERATE PROBLEMS DOING MY USUAL ACTIVIITIES []   I HAVE SEVERE PROBLEMS DOING MY USUAL ACTIVITIES []   I AM UNABLE TO DO MY USUAL ACTIVITIES []     PAIN /DISCOMFORT   I HAVE NO PAIN OR DISCOMFORT [x]   I HAVE SLIGHT PAIN OR DISCOMFORT []   I HAVE MODERATE PAIN OR DISCOMFORT []   I HAVE SEVERE PAIN OR DISCOMFORT []   I HAVE EXTREME PAIN OR DISCOMFORT []     ANXIETY/DEPRESSION   I AM NOT ANXIOUS OR DEPRESSED [x]   I AM SLIGHTLY ANXIOUS OR DEPRESSED []   I AM MODERATELY ANXIOUS OR DREPRESSED []   I AM SEVERELY ANXIOUS OR DEPRESSED []   I AM EXTREMELY ANXIOUS OR DEPRESSED []     SCALE OF 0-100 HOW WOULD YOU RATE TODAY?  0 IS THE WORSE AND 100 IS THE BEST HEALTH YOU CAN IMAGINE: 90

## 2018-10-18 NOTE — Telephone Encounter (Signed)
Copied from [place] [phone]. Topic: Quick Communication - Home Health Verbal Orders >> Oct 18, 2018  4:24 PM Carolyn Stare wrote: Caller/Agency   [name]   with [facility]   Callback Number [phone]   can ask to speak with Melissa   Requesting home health aide 2 x 4   Frequency skilled nursing  2 x 4    After care from heart surgery and help with personal care

## 2018-10-18 NOTE — Telephone Encounter (Signed)
Called [facility] who request order from MD, last office note, and demographics be faxed to them at [place]. I am forwarding message to Dr. Meda Coffee, primary cardiologist for consent and for discipline patient will need to follow s/p TAVR.

## 2018-10-18 NOTE — Telephone Encounter (Signed)
-----   Message from Eileen Stanford, PA-C sent at 10/14/2018  8:10 PM EST ----- I forgot to do this today. Can you order home health PT for Mr. Monestime. He was working with a company called [facility] and they would like to work with them again. I dont know how to do that. She gave me a number to call for them? [phone]. Thanks so much [place]. I was able to talk to her about his echo!!  KT

## 2018-10-18 NOTE — Addendum Note (Signed)
Addended by: De Burrs on: 10/18/2018 08:07 AM   Modules accepted: Orders

## 2018-10-18 NOTE — Telephone Encounter (Signed)
Should orders come from cardiology?

## 2018-10-19 ENCOUNTER — Encounter: Payer: Self-pay | Admitting: *Deleted

## 2018-10-19 ENCOUNTER — Other Ambulatory Visit: Payer: Self-pay | Admitting: *Deleted

## 2018-10-19 NOTE — Telephone Encounter (Signed)
Called [facility] myself and endorsed to them that we will place a new referral order for in-home PT from their services, to them, at contact information provided.  Endorsed I will fax this to [phone] attention to Santiago Glad.  Endorsed to [facility] Representative that further orders needed from them for Dr Meda Coffee or Angelena Form PA-C to be signed, will not be addressed until they return to the office on 10/27/18.  Representative verbalized understanding and agrees with this plan. Referral to be faxed, as well as demographic information, and [name] [name]'s last OV note with the pt on 10/14/18.

## 2018-10-19 NOTE — Patient Outreach (Signed)
[facility]) Care Management  10/19/2018  BOSTON COOKSON 1925/08/18 [id number]    Transition of care  RN spoke with pt and verify identifiers and purpose for the call today. Introduced [facility] and available services. RN inquired further on pt's recent discharge from the SNF. Pt verified [facility] involvement with a visit from the [facility] nurse who confirmed pt will be receiving PT services in the home. RN was able to complete the transition of care template and review pt's medications. Pt reported he is current awiating for his primary provider's ofice to call back and confirm a follow up appointment. RN strongly encouraged pt to follow up accordingly for this appointment.   RN discussed pt's medical history and inquired further on pt's needs. Inquired on pt's COPD as pt indicated this is managed with no reported problems. RN discussed HF as pt reports he is weighing however has missed a few days but no additional swelling. Pt states he has scales and was documenting all her weights but has not been consistent since discharged from the SNF. RN discussed the importance of HF monitoring with daily weights and how fluid retention can affect his breathing along with swelling. Educated pt on the HF zones and verified pt is in the GREEN zone with no acute symptoms at this time. Offered community home visit is needed to further address his management of care however pt states he has a daughter who regulates his medications and visits weekly for pill box refills. Pt states he gets his medications from [facility] delivered to his home. RN offered to assist with transition of care calls weekly over the next few weeks as he continue to manage his care (pt receptive). RN discussed a plan of care that strongly surrounds pt's HF and discussed long/short terms goals to improve his ongoing management of care. Educated on HF with 3 lbs overnight or 5 lbs over one week with any precipitating symptoms to be  reported to his provider's office early to avoid hospitalizations. Pt verbalized an understanding. Pt has agreed with the plan of care and receptive to ongoing education over the next few weeks of inquires. Based upon the discussion today will follow up next week with a scheduled telephone call. Will notify pt's of pt's participation and disposition with [facility] services.   Patient was recently discharged from hospital and all medications have been reviewed. [facility] CM Care Plan Problem One     Most Recent Value  Care Plan Problem One  Knowledge deficit related to HF  Role Documenting the Problem One  Care Management Coordinator  Care Plan for Problem One  Active  THN Long Term Goal   Pt will have an increase knowledge base on HF and recognize early symptoms to report to provider's office within the next 90 days.  [facility]N Long Term Goal Start Date  10/19/18  Interventions for Problem One Long Term Goal  Will educate on HF zones and verifiy pt is in the GREEN zone and aware when to seek medical attention based upon the information provider.   [facility] CM Short Term Goal #1   Pt will complete daily weights and document all readings for providers to view over the next 30 days  [facility] CM Short Term Goal #1 Start Date  10/19/18  Interventions for Short Term Goal #1  Will strongly encourage adherence with daily weights and stress the importance of reporting fluid retention. Will education on the GREEN zone of gainging 3 lbs overnight or 5 lbs within  one week to the provider's office for early interventions.   [facility] CM Short Term Goal #2   Adherence with all pending medical appointments with his providers offices post SNF discharge  [facility] CM Short Term Goal #2 Start Date  10/19/18  Interventions for Short Term Goal #2  Will strongly encouraged pt to attend all medical appointments and verify sufficient transportation. Will offer other resources for transportation if needed at this time.  Will strongly encouraged t to follow up  with his primary provider pending post-op hospital/SNF discharge (currently awaiting call back).      Raina Mina, RN Care Management Coordinator [place] Office [phone]

## 2018-10-19 NOTE — Telephone Encounter (Signed)
Was able to reach [facility].  [name] or Melissa were not able to come to the phone.  Will try back at a later time.

## 2018-10-19 NOTE — Telephone Encounter (Signed)
Ideally should come from cardiology

## 2018-10-20 ENCOUNTER — Telehealth: Payer: Self-pay | Admitting: Cardiology

## 2018-10-20 NOTE — Telephone Encounter (Signed)
Pts Physical Therapist Laurey Arrow is just calling to clarify how the pt is to take his metolazone and KDUR, if he has swelling or weight increase.  Endorsed to [place] with PT that per med instructions he is to take metolazone 2.5 mg po mouth as needed for weight gain > 3 lbs or worsening swelling.  Endorsed to [place] that he should take an extra [facility] with his metolazone.  Laurey Arrow verbalized understanding and agrees with this plan.  Laurey Arrow states the pt is doing well, has mild swelling, but did great with his PT today.  Per Laurey Arrow, he will instruct the pt to take his PRN metolazone and extra KDUR, as prescribed.   18 gracious for al the assistance provided.  Will send to Dr Meda Coffee and Angelena Form PA-C as an Juluis Rainier.

## 2018-10-20 NOTE — Telephone Encounter (Signed)
Laurey Arrow was there for Physical Therapy evaluation today he noticed patient's weight has increased. Patient's weight on  11/23 was 162lbs, today it was 11/27 167lbs  Patient stated he has not been taking his metolazone (ZAROXOLYN) 2.5 MG tablet.  Laurey Arrow wants to know should patient be taking it.

## 2018-10-20 NOTE — Telephone Encounter (Signed)
This encounter was created in error - please disregard.

## 2018-10-20 NOTE — Telephone Encounter (Signed)
Tried to reach [facility].  No answer or machine.  Will assume they are closed for the holiday.  Will try again at a later time.

## 2018-10-22 DIAGNOSIS — J449 Chronic obstructive pulmonary disease, unspecified: Secondary | ICD-10-CM | POA: Diagnosis not present

## 2018-10-22 DIAGNOSIS — I110 Hypertensive heart disease with heart failure: Secondary | ICD-10-CM | POA: Diagnosis not present

## 2018-10-22 DIAGNOSIS — I4811 Longstanding persistent atrial fibrillation: Secondary | ICD-10-CM | POA: Diagnosis not present

## 2018-10-22 DIAGNOSIS — R1311 Dysphagia, oral phase: Secondary | ICD-10-CM | POA: Diagnosis not present

## 2018-10-22 DIAGNOSIS — I5033 Acute on chronic diastolic (congestive) heart failure: Secondary | ICD-10-CM | POA: Diagnosis not present

## 2018-10-22 DIAGNOSIS — I2510 Atherosclerotic heart disease of native coronary artery without angina pectoris: Secondary | ICD-10-CM | POA: Diagnosis not present

## 2018-10-25 ENCOUNTER — Other Ambulatory Visit: Payer: Self-pay | Admitting: *Deleted

## 2018-10-25 ENCOUNTER — Telehealth: Payer: Self-pay | Admitting: Adult Health

## 2018-10-25 DIAGNOSIS — I4811 Longstanding persistent atrial fibrillation: Secondary | ICD-10-CM | POA: Diagnosis not present

## 2018-10-25 DIAGNOSIS — I2510 Atherosclerotic heart disease of native coronary artery without angina pectoris: Secondary | ICD-10-CM | POA: Diagnosis not present

## 2018-10-25 DIAGNOSIS — J449 Chronic obstructive pulmonary disease, unspecified: Secondary | ICD-10-CM | POA: Diagnosis not present

## 2018-10-25 DIAGNOSIS — I5033 Acute on chronic diastolic (congestive) heart failure: Secondary | ICD-10-CM | POA: Diagnosis not present

## 2018-10-25 DIAGNOSIS — I110 Hypertensive heart disease with heart failure: Secondary | ICD-10-CM | POA: Diagnosis not present

## 2018-10-25 DIAGNOSIS — R1311 Dysphagia, oral phase: Secondary | ICD-10-CM | POA: Diagnosis not present

## 2018-10-25 NOTE — Patient Outreach (Signed)
[facility]) Care Management  10/25/2018  [name] [name] 09-Aug-1925 [id number]    Transition of care  RN attempted outreach call today however unsuccessful and unable to leave a message. Will follow up once again later in this week with another outreach call.  Raina Mina, RN Care Management Coordinator [place] Office [phone]

## 2018-10-25 NOTE — Telephone Encounter (Signed)
Copied from [place] [phone]. Topic: Quick Communication - Home Health Verbal Orders >> Oct 25, 2018  4:46 PM Vernona Rieger wrote: Caller/Agency: Clair Gulling with [facility] Number: [phone] Requesting OT/PT/Skilled Nursing/Social Work: Occupational Therapy Frequency: 2 week 3, 1 week 2

## 2018-10-26 DIAGNOSIS — I4811 Longstanding persistent atrial fibrillation: Secondary | ICD-10-CM | POA: Diagnosis not present

## 2018-10-26 DIAGNOSIS — I2510 Atherosclerotic heart disease of native coronary artery without angina pectoris: Secondary | ICD-10-CM | POA: Diagnosis not present

## 2018-10-26 DIAGNOSIS — I5033 Acute on chronic diastolic (congestive) heart failure: Secondary | ICD-10-CM | POA: Diagnosis not present

## 2018-10-26 DIAGNOSIS — I110 Hypertensive heart disease with heart failure: Secondary | ICD-10-CM | POA: Diagnosis not present

## 2018-10-26 DIAGNOSIS — R1311 Dysphagia, oral phase: Secondary | ICD-10-CM | POA: Diagnosis not present

## 2018-10-26 DIAGNOSIS — J449 Chronic obstructive pulmonary disease, unspecified: Secondary | ICD-10-CM | POA: Diagnosis not present

## 2018-10-26 NOTE — Telephone Encounter (Signed)
That is fine 

## 2018-10-26 NOTE — Telephone Encounter (Signed)
Left a message for Clair Gulling to return my call.  All orders will need to come from cardiology.  [facility] agent may tell Clair Gulling to contact cardiology for orders.  Please document.

## 2018-10-26 NOTE — Telephone Encounter (Signed)
[name] [name], spoke with Joycelyn Schmid (office manager/home health) and she is asking if you will be able to sign for the home health aid, PT, OT and skilled nursing.  All cardiology orders will go directly to cardiology.  She will add that to the plan of care.  Please advise.

## 2018-10-26 NOTE — Telephone Encounter (Signed)
Spoke to [place] and she will proceed with orders.  Nothing else needed at this time.

## 2018-10-26 NOTE — Telephone Encounter (Signed)
[name] [name] notified that orders will need to come from cardiology.  Nothing further needed at this time.

## 2018-10-26 NOTE — Telephone Encounter (Signed)
Left a detailed message on machine for Dana Allan advising her the orders will need to come from the patient's cardiologist.  Nothing further needed at this time.

## 2018-10-27 DIAGNOSIS — R1311 Dysphagia, oral phase: Secondary | ICD-10-CM | POA: Diagnosis not present

## 2018-10-27 DIAGNOSIS — I4811 Longstanding persistent atrial fibrillation: Secondary | ICD-10-CM | POA: Diagnosis not present

## 2018-10-27 DIAGNOSIS — I2510 Atherosclerotic heart disease of native coronary artery without angina pectoris: Secondary | ICD-10-CM | POA: Diagnosis not present

## 2018-10-27 DIAGNOSIS — I110 Hypertensive heart disease with heart failure: Secondary | ICD-10-CM | POA: Diagnosis not present

## 2018-10-27 DIAGNOSIS — I5033 Acute on chronic diastolic (congestive) heart failure: Secondary | ICD-10-CM | POA: Diagnosis not present

## 2018-10-27 DIAGNOSIS — J449 Chronic obstructive pulmonary disease, unspecified: Secondary | ICD-10-CM | POA: Diagnosis not present

## 2018-10-28 DIAGNOSIS — I2510 Atherosclerotic heart disease of native coronary artery without angina pectoris: Secondary | ICD-10-CM | POA: Diagnosis not present

## 2018-10-28 DIAGNOSIS — I4811 Longstanding persistent atrial fibrillation: Secondary | ICD-10-CM | POA: Diagnosis not present

## 2018-10-28 DIAGNOSIS — I110 Hypertensive heart disease with heart failure: Secondary | ICD-10-CM | POA: Diagnosis not present

## 2018-10-28 DIAGNOSIS — J449 Chronic obstructive pulmonary disease, unspecified: Secondary | ICD-10-CM | POA: Diagnosis not present

## 2018-10-28 DIAGNOSIS — R1311 Dysphagia, oral phase: Secondary | ICD-10-CM | POA: Diagnosis not present

## 2018-10-28 DIAGNOSIS — I5033 Acute on chronic diastolic (congestive) heart failure: Secondary | ICD-10-CM | POA: Diagnosis not present

## 2018-10-29 ENCOUNTER — Telehealth: Payer: Self-pay | Admitting: Adult Health

## 2018-10-29 ENCOUNTER — Other Ambulatory Visit: Payer: Self-pay | Admitting: *Deleted

## 2018-10-29 DIAGNOSIS — I2510 Atherosclerotic heart disease of native coronary artery without angina pectoris: Secondary | ICD-10-CM | POA: Diagnosis not present

## 2018-10-29 DIAGNOSIS — I4811 Longstanding persistent atrial fibrillation: Secondary | ICD-10-CM | POA: Diagnosis not present

## 2018-10-29 DIAGNOSIS — I110 Hypertensive heart disease with heart failure: Secondary | ICD-10-CM | POA: Diagnosis not present

## 2018-10-29 DIAGNOSIS — I5033 Acute on chronic diastolic (congestive) heart failure: Secondary | ICD-10-CM | POA: Diagnosis not present

## 2018-10-29 DIAGNOSIS — J449 Chronic obstructive pulmonary disease, unspecified: Secondary | ICD-10-CM | POA: Diagnosis not present

## 2018-10-29 DIAGNOSIS — R1311 Dysphagia, oral phase: Secondary | ICD-10-CM | POA: Diagnosis not present

## 2018-10-29 NOTE — Patient Outreach (Signed)
[facility]) Care Management  10/29/2018  SHELTON SQUARE 11/24/1925 [id number]  Transition of care  RN spoke with pt today and inquired on pt's ongoing management of care. Pt reports his weights over the last week at 165 lbs with [facility] nurse applying a dressing to the leg. Pt states the site is improving and he will continue to weigh daily. Pt denies any breathing issues and states only a small amount he inform on swelling. RN reiterated on the HF zones and verified pt remains in the GREEN zone with no symptoms other then the small amount of swelling to his LE where he is receiving the dressing. Plan of care reviewed with pt and RN discussed the intervention, goals and adjusted the interventions according to pt's progress. Pt remains receptive to this plan of care and strongly encouraged to adhere to his ongoing self management of care. RN praise the pt on his progress and continued to offer community resources where needed. Based upon pt's ongoing progress will follow up next week via transition of care and continue to assist pt with his ongoing management of care. Will continue to reiterate on the risk involved if pt does not adhere to management this condition.   [facility] CM Care Plan Problem One     Most Recent Value  Care Plan Problem One  Knowledge deficit related to HF  Role Documenting the Problem One  Care Management Coordinator  Care Plan for Problem One  Active  THN Long Term Goal   Pt will have an increase knowledge base on HF and recognize early symptoms to report to provider's office within the next 90 days.  THN Long Term Goal Start Date  10/19/18  Interventions for Problem One Long Term Goal  Will continue to encourage ongong management of his HF and verifiy pt remains in the GREEN zone. Will continue to review education on HF and verify pt's understanding. WIll extend this goal to allow ongoing adherence with managing this condition.  [facility] CM Short Term Goal #1   Pt  will complete daily weights and document all readings for providers to view over the next 30 days  [facility] CM Short Term Goal #1 Start Date  10/19/18  Interventions for Short Term Goal #1  Will verify pt continues to weigh dialy. Will extend to allow adherence once [facility] completes wound care to LE.  [facility] CM Short Term Goal #2   Adherence with all pending medical appointments with his providers offices post SNF discharge  [facility] CM Short Term Goal #2 Start Date  10/19/18  Interventions for Short Term Goal #2  Will verify adherence with medical appointments and continue to encouraged pt to attend all appointments for ongoing managemen tof care      Raina Mina, RN Care Management Coordinator [place] Office [phone]

## 2018-10-29 NOTE — Telephone Encounter (Signed)
Copied from [place] [phone]. Topic: General - Other >> Oct 29, 2018 10:58 AM Keene Breath wrote: Reason for CRM: Elta Guadeloupe with [facility] called to get an order for a bedside commode frame from [facility] for patient at home.  Elta Guadeloupe stated that the patient has a really low toilet frame at home and it is a high fall risk.  Please advise.  CB# [phone].

## 2018-11-01 ENCOUNTER — Telehealth: Payer: Self-pay

## 2018-11-01 DIAGNOSIS — I4811 Longstanding persistent atrial fibrillation: Secondary | ICD-10-CM | POA: Diagnosis not present

## 2018-11-01 DIAGNOSIS — I5033 Acute on chronic diastolic (congestive) heart failure: Secondary | ICD-10-CM | POA: Diagnosis not present

## 2018-11-01 DIAGNOSIS — I110 Hypertensive heart disease with heart failure: Secondary | ICD-10-CM | POA: Diagnosis not present

## 2018-11-01 DIAGNOSIS — R1311 Dysphagia, oral phase: Secondary | ICD-10-CM | POA: Diagnosis not present

## 2018-11-01 DIAGNOSIS — I2510 Atherosclerotic heart disease of native coronary artery without angina pectoris: Secondary | ICD-10-CM | POA: Diagnosis not present

## 2018-11-01 DIAGNOSIS — J449 Chronic obstructive pulmonary disease, unspecified: Secondary | ICD-10-CM | POA: Diagnosis not present

## 2018-11-01 NOTE — Telephone Encounter (Signed)
Copied from [place] [phone]. Topic: Appointment Scheduling - Scheduling Inquiry for Clinic >> Nov 01, 2018 11:11 AM Alanda Slim E wrote: Reason for CRM: Pt daughter(Charlene) called in to speak with Villa Herb about scheduling Pt for Coumadin Clinic. He recently had heart valve surgery.  Please call one of his daughters [name] cb# [phone] or [name] workCb# [phone]

## 2018-11-02 ENCOUNTER — Other Ambulatory Visit: Payer: Self-pay | Admitting: Hematology

## 2018-11-02 DIAGNOSIS — I4811 Longstanding persistent atrial fibrillation: Secondary | ICD-10-CM | POA: Diagnosis not present

## 2018-11-02 DIAGNOSIS — J449 Chronic obstructive pulmonary disease, unspecified: Secondary | ICD-10-CM | POA: Diagnosis not present

## 2018-11-02 DIAGNOSIS — I5033 Acute on chronic diastolic (congestive) heart failure: Secondary | ICD-10-CM | POA: Diagnosis not present

## 2018-11-02 DIAGNOSIS — Z7901 Long term (current) use of anticoagulants: Secondary | ICD-10-CM

## 2018-11-02 DIAGNOSIS — I2510 Atherosclerotic heart disease of native coronary artery without angina pectoris: Secondary | ICD-10-CM | POA: Diagnosis not present

## 2018-11-02 DIAGNOSIS — R1311 Dysphagia, oral phase: Secondary | ICD-10-CM | POA: Diagnosis not present

## 2018-11-02 DIAGNOSIS — I110 Hypertensive heart disease with heart failure: Secondary | ICD-10-CM | POA: Diagnosis not present

## 2018-11-03 ENCOUNTER — Other Ambulatory Visit: Payer: Self-pay | Admitting: Cardiology

## 2018-11-03 ENCOUNTER — Encounter: Payer: Self-pay | Admitting: Family Medicine

## 2018-11-03 ENCOUNTER — Telehealth: Payer: Self-pay | Admitting: General Practice

## 2018-11-03 DIAGNOSIS — I4811 Longstanding persistent atrial fibrillation: Secondary | ICD-10-CM | POA: Diagnosis not present

## 2018-11-03 DIAGNOSIS — R1311 Dysphagia, oral phase: Secondary | ICD-10-CM | POA: Diagnosis not present

## 2018-11-03 DIAGNOSIS — I5033 Acute on chronic diastolic (congestive) heart failure: Secondary | ICD-10-CM | POA: Diagnosis not present

## 2018-11-03 DIAGNOSIS — J449 Chronic obstructive pulmonary disease, unspecified: Secondary | ICD-10-CM | POA: Diagnosis not present

## 2018-11-03 DIAGNOSIS — I2510 Atherosclerotic heart disease of native coronary artery without angina pectoris: Secondary | ICD-10-CM | POA: Diagnosis not present

## 2018-11-03 DIAGNOSIS — I110 Hypertensive heart disease with heart failure: Secondary | ICD-10-CM | POA: Diagnosis not present

## 2018-11-03 MED ORDER — FUROSEMIDE 40 MG PO TABS: 80.0000 mg | ORAL_TABLET | Freq: Every morning | ORAL | 3 refills | Status: DC

## 2018-11-03 NOTE — Telephone Encounter (Signed)
Spoke to [employer].  Request has been sent to [facility].  Received confirmation that the fax was successful.  No further action required at this time.

## 2018-11-03 NOTE — Telephone Encounter (Signed)
-----   Message from Brunetta Genera, MD sent at 11/02/2018  5:17 PM EST ----- Regarding: RE: PT/INR PT/INR lab ordered thx [place]  ----- Message ----- From: Warden Fillers, RN Sent: 11/01/2018   1:58 PM EST To: Warden Fillers, RN, Gautam Juleen China, MD Subject: PT/INR                                         Hi Dr. Irene Limbo,  My name is Villa Herb.  I am the coumadin clinic RN that checks patient's INR at [facility] primary care.   Patient is due for labs and a visit with you on Thursday, 12/12.  Would you mind adding a PT/INR to his lab visit?  This would be a great help to the family as it will save a trip to my office.  I will be watching for the results and will call the patient and his daughters with dosing instructions.  Thank you so very much!  Thanks! Villa Herb, RN [facility]

## 2018-11-03 NOTE — Telephone Encounter (Signed)
Mark calling to check status of request. Please call him.

## 2018-11-03 NOTE — Progress Notes (Signed)
Marland Kitchen  HEMATOLOGY ONCOLOGY PROGRESS NOTE  Date of service: 04/08/18  Patient Care Team: [name], NP as PCP - General (Family Medicine) Tobi Bastos, RN as [place] Management  CC: f/u for Anemia  INTERVAL HISTORY:  [name] is here for followup his multifactorial anemia. The patient's last visit with [place] was on 04/08/18. He is accompanied today by his son. The pt reports that he is doing well overall.   The pt reports that his leg swelling has improved in the interim, and he is using a rolling walker to ambulate successfully. The pt continues on Coumadin. He had a valve replacement in October for his severe aortic stenosis and subsequently received a blood transfusion.   The pt denies any blood in the stools or black stools.   The pt is taking 12.49m Methotrexate once a week for his RA. He continues taking his Folic Acid and Vitamin B complex.   Lab results today (11/04/18) of CBC w/diff and CMP is as follows: all values are WNL except for RBC at 3.59, HGB at 10.2, HCT at 32.4, RDW at 18.3, Chloride at 97, CO2 at 37, Creatinine at 1.28, Total Protein at 6.4, Albumin at 3.3, GFR at 49. 11/04/18 Iron and TIBC revealed all values WNL except for Iron at 33 and 11% Saturation Ratio.  11/04/18 Ferritin is at 30 11/04/18 Protime-INR revealed Prothrombin time at 32.0 seconds, INR normal at 3.16  On review of systems, pt reports decreased leg swelling, regular bowel movements, and denies SOB, CP, blood in the stools, black stools, abdominal pains, and any other symptoms.   REVIEW OF SYSTEMS:    A 10+ POINT REVIEW OF SYSTEMS WAS OBTAINED including neurology, dermatology, psychiatry, cardiac, respiratory, lymph, extremities, GI, GU, Musculoskeletal, constitutional, breasts, reproductive, HEENT.  All pertinent positives are noted in the HPI.  All others are negative.   . Past Medical History:  Diagnosis Date  . Atrial fibrillation, chronic    a. on coumadin    . Carotid artery occlusion    right total internal artery occlusion  . Chronic diastolic CHF (congestive heart failure) ([place]   . Coronary artery disease    s/p cardiac cath in 1990s, and cardiolite study  in 2005 showing EF 54%  . Degenerative disc disease    cervical spine  . GERD (gastroesophageal reflux disease)   . HIV infection ([place]   . Hyperlipidemia   . Hypertension   . Hypothyroidism   . Rheumatoid arthritis ([place]   . S/P TAVR (transcatheter aortic valve replacement)    Edwards Sapien 3 THV (size 29 mm) via the R TF approach   . Skin cancer    forehead  . Stroke ([facility]    TIA   . TIA (transient ischemic attack)   . Urgency incontinence     . Past Surgical History:  Procedure Laterality Date  . AMPUTATION Right 02/21/2015   Procedure: Right 3rd Ray Amputation;  Surgeon: MNewt Minion MD;  [place]  Service: Orthopedics;  Laterality: Right;  Digital block and ankle block with MAC  . CARDIAC CATHETERIZATION  1990s  . CATARACT EXTRACTION W/ INTRAOCULAR LENS  IMPLANT, BILATERAL    . EYE SURGERY     cataracts bilateral  . INTRAOPERATIVE TRANSTHORACIC ECHOCARDIOGRAM  09/14/2018   Procedure: INTRAOPERATIVE TRANSTHORACIC ECHOCARDIOGRAM;  Surgeon: CSherren Mocha MD;  [place]  Service: Open Heart Surgery;;  . RIGHT/LEFT HEART CATH AND CORONARY ANGIOGRAPHY N/A 07/21/2018   Procedure: RIGHT/LEFT HEART  CATH AND CORONARY ANGIOGRAPHY;  Surgeon: Sherren Mocha, MD;  Location: [place] [facility];  Service: Cardiovascular;  Laterality: N/A;  . SKIN CANCER EXCISION  12/2011   forehead  . TRANSCATHETER AORTIC VALVE REPLACEMENT, TRANSFEMORAL N/A 09/14/2018   Procedure: TRANSCATHETER AORTIC VALVE REPLACEMENT, TRANSFEMORAL. 21m EDWARDS SAPIEN3 THV.;  Surgeon: CSherren Mocha MD;  [place]  Service: Open Heart Surgery;  Laterality: N/A;    . Social History   Tobacco Use  . Smoking status: Former Smoker    Packs/day: 1.00    Years: 40.00    Pack years:  40.00    Types: Cigarettes    Last attempt to quit: 09/12/1989    Years since quitting: 29.1  . Smokeless tobacco: Never Used  . Tobacco comment: quit around 1992  Substance Use Topics  . Alcohol use: No    Alcohol/week: 0.0 standard drinks    Comment: former, quit drinking in 1992  . Drug use: No    ALLERGIES:  is allergic to levofloxacin and klor-con [potassium chloride er].  MEDICATIONS:  Current Outpatient Medications  Medication Sig Dispense Refill  . aspirin 81 MG tablet Take 81 mg by mouth at bedtime.    .Marland Kitchenatorvastatin (LIPITOR) 10 MG tablet TAKE 1 TABLET AT BEDTIME 90 tablet 3  . Calcium-Vitamin D (CALTRATE 600 PLUS-VIT D PO) Take 1 tablet by mouth at bedtime.     . docusate sodium (COLACE) 100 MG capsule Take 1 capsule (100 mg total) by mouth daily as needed for mild constipation. 30 capsule 6  . doxycycline (VIBRAMYCIN) 100 MG capsule Take 1 capsule (100 mg total) by mouth 2 (two) times daily. (Patient not taking: Reported on 10/19/2018) 10 capsule 0  . folic acid (FOLVITE) 1 MG tablet Take 1 mg by mouth daily.     . furosemide (LASIX) 40 MG tablet Take 2 tablets (80 mg total) by mouth every morning. 180 tablet 3  . guaiFENesin (MUCINEX) 600 MG 12 hr tablet Take 600-1,200 mg by mouth 2 (two) times daily as needed (cold/congestion.).    .Marland Kitchenmethotrexate (RHEUMATREX) 2.5 MG tablet Take 10 mg by mouth once a week. Caution:Chemotherapy. Protect from light.    . metolazone (ZAROXOLYN) 2.5 MG tablet Take 1 tablet (2.5 mg total) by mouth as needed. As needed for weight gain >3 lbs or worsening swelling. Take extra potassium with Metolazone 36 tablet 3  . Multiple Vitamin (MULTIVITAMIN WITH MINERALS) TABS tablet Take 1 tablet by mouth daily.    . nitroGLYCERIN (NITROSTAT) 0.4 MG SL tablet Place 1 tablet (0.4 mg total) under the tongue every 5 (five) minutes as needed for chest pain. 25 tablet 0  . potassium chloride SA (K-DUR,KLOR-CON) 20 MEQ tablet Take 2 tablets (40 mEq total) by  mouth every morning. 180 tablet 3  . Probiotic Product (HEALTHY COLON PO) Take 1 capsule by mouth daily.    . tamsulosin (FLOMAX) 0.4 MG CAPS capsule Take 0.4 mg by mouth every evening.    . TOPROL XL 25 MG 24 hr tablet TAKE ONE-HALF TABLET (12.5 MG TOTAL) DAILY WITH OR IMMEDIATELY FOLLOWING A MEAL (DOSE CHANGE) 45 tablet 2  . warfarin (COUMADIN) 5 MG tablet Take 1 tablet daily except 1 1/2 Mon Wed Fri or TAKE AS DIRECTED BY ANTICOAGULATION CLINIC (Patient taking differently: Take 5-7.5 mg by mouth See admin instructions. Take 1.5 tablets (7.5 mg) by mouth on Mondays, Wednesdays, & Fridays. Take 1 tablet (5 mg) by mouth on Sundays, Tuesdays, Thursdays, & Saturdays.) 120 tablet 1  No current facility-administered medications for this visit.     PHYSICAL EXAMINATION:  ECOG PERFORMANCE STATUS: 1 - Symptomatic but completely ambulatory  . Vitals:   11/04/18 1412  BP: 98/70  Pulse: 77  Resp: 17  Temp: 97.9 F (36.6 C)  SpO2: 98%    Filed Weights   11/04/18 1412  Weight: 177 lb 11.2 oz (80.6 kg)   .Body mass index is 24.78 kg/m.  GENERAL:alert, in no acute distress and comfortable SKIN: no acute rashes, no significant lesions EYES: conjunctiva are pink and non-injected, sclera anicteric OROPHARYNX: MMM, no exudates, no oropharyngeal erythema or ulceration NECK: supple, no JVD LYMPH:  no palpable lymphadenopathy in the cervical, axillary or inguinal regions LUNGS: clear to auscultation b/l with normal respiratory effort HEART: regular rate & rhythm ABDOMEN:  normoactive bowel sounds , non tender, not distended. No palpable hepatosplenomegaly.  Extremity: 1+ pedal edema PSYCH: alert & oriented x 3 with fluent speech NEURO: no focal motor/sensory deficits   LABORATORY DATA:    CBC Latest Ref Rng & Units 11/04/2018 09/17/2018 09/16/2018  WBC 4.0 - 10.5 K/uL 7.3 8.7 7.6  Hemoglobin 13.0 - 17.0 g/dL 10.2(L) 8.0(L) 8.0(L)  Hematocrit 39.0 - 52.0 % 32.4(L) 25.6(L) 25.4(L)    Platelets 150 - 400 K/uL 233 128(L) 125(L)   . CBC    Component Value Date/Time   WBC 7.3 11/04/2018 1310   RBC 3.59 (L) 11/04/2018 1310   HGB 10.2 (L) 11/04/2018 1310   HGB 10.1 (L) 07/15/2018 1249   HGB 11.0 (L) 05/05/2017 1437   HCT 32.4 (L) 11/04/2018 1310   HCT 29.8 (L) 07/15/2018 1249   HCT 33.7 (L) 05/05/2017 1437   PLT 233 11/04/2018 1310   PLT 249 07/15/2018 1249   MCV 90.3 11/04/2018 1310   MCV 95 07/15/2018 1249   MCV 96.8 05/05/2017 1437   MCH 28.4 11/04/2018 1310   MCHC 31.5 11/04/2018 1310   RDW 18.3 (H) 11/04/2018 1310   RDW 15.7 (H) 07/15/2018 1249   RDW 15.3 (H) 05/05/2017 1437   LYMPHSABS 0.9 11/04/2018 1310   LYMPHSABS 0.9 07/15/2018 1249   LYMPHSABS 1.0 05/05/2017 1437   MONOABS 0.4 11/04/2018 1310   MONOABS 0.4 05/05/2017 1437   EOSABS 0.3 11/04/2018 1310   EOSABS 0.2 07/15/2018 1249   BASOSABS 0.0 11/04/2018 1310   BASOSABS 0.0 07/15/2018 1249   BASOSABS 0.0 05/05/2017 1437    . CMP Latest Ref Rng & Units 11/04/2018 09/17/2018 09/16/2018  Glucose 70 - 99 mg/dL 85 105(H) 102(H)  BUN 8 - 23 mg/dL 20 16 16   Creatinine 0.61 - 1.24 mg/dL 1.28(H) 1.16 1.17  Sodium 135 - 145 mmol/L 141 133(L) 135  Potassium 3.5 - 5.1 mmol/L 3.6 3.4(L) 3.5  Chloride 98 - 111 mmol/L 97(L) 98 100  CO2 22 - 32 mmol/L 37(H) 29 30  Calcium 8.9 - 10.3 mg/dL 9.1 7.8(L) 7.8(L)  Total Protein 6.5 - 8.1 g/dL 6.4(L) - -  Total Bilirubin 0.3 - 1.2 mg/dL 0.4 - -  Alkaline Phos 38 - 126 U/L 60 - -  AST 15 - 41 U/L 25 - -  ALT 0 - 44 U/L 11 - -   . Lab Results  Component Value Date   IRON 33 (L) 11/04/2018   TIBC 308 11/04/2018   IRONPCTSAT 11 (L) 11/04/2018   (Iron and TIBC)  Lab Results  Component Value Date   FERRITIN 30 11/04/2018   .  RADIOGRAPHIC STUDIES: I have personally reviewed the radiological images as listed  and agreed with the findings in the report. No results found.  ASSESSMENT & PLAN:   84 y.o. male with   1) Multifactorial Normocytic  Anemia. This appears to be primarily related to anemia of chronic inflammation related to his rheumatoid arthritis along with perhaps some element of functional iron deficiency. It was also partially related to his methotrexate which he is off of since November 2017 but has been put back on. . Lab Results  Component Value Date   IRON 33 (L) 11/04/2018   TIBC 308 11/04/2018   IRONPCTSAT 11 (L) 11/04/2018   (Iron and TIBC)  Lab Results  Component Value Date   FERRITIN 30 11/04/2018    Cannot rule out an element of low level MDS. Monitoring for GI bleeding on anticoagulation.  PLAN:  -Discussed pt labwork today, 11/04/18; HGB increased to 10.2, WBC normal at 7.3k, PLT normal at 233k. 11% Saturation ratio.  -11/04/18 Ferritin is at 30 -Suspect low Ferritin from either ongoing GI blood loss in the setting of Coumadin use vs blood loss from October valve replacement procedure -Continue follow up with Cardiology for Coumadin management  -CKD and RA would necessitate a goal for ferritin > 100 -Will order IV Injectafer x weekly for 2 doses -Continue Vitamin B complex and 23m Folic acid daily to protect from anemia related to Methotrexate  -Will see the pt back in 4 months to monitor ferritin again  -if Hgb <9 despite ferritin >100 -- might need to consider adjusting MTX and considering EPO - not indicated currently. -would recommend atleast 264mof folic acid daily while on MTX. -Recommend taking a B complex and continuing folic acid   IV Injectafer weekly x 2 doses RTC with Dr KaIrene Limboith labs in 4 months   The total time spent in the appt was 25 minutes and more than 50% was on counseling and direct patient cares.    GaSullivan LoneD MSSuttonAHIVMS SCGood Samaritan Hospital - SuffernTRiverwood Healthcare Centerematology/Oncology Physician CoStanton County Hospital(Office):       [phone] cell):  [phone]):           [phone], ScBaldwin Jamaicaam acting as a scribe for Dr. GaSullivan Lone  .I have reviewed the  above documentation for accuracy and completeness, and I agree with the above. .GBrunetta GeneraD

## 2018-11-04 ENCOUNTER — Telehealth: Payer: Self-pay | Admitting: Hematology

## 2018-11-04 ENCOUNTER — Inpatient Hospital Stay: Payer: Medicare Other | Attending: Hematology

## 2018-11-04 ENCOUNTER — Ambulatory Visit (INDEPENDENT_AMBULATORY_CARE_PROVIDER_SITE_OTHER): Payer: Medicare Other | Admitting: General Practice

## 2018-11-04 ENCOUNTER — Encounter: Payer: Self-pay | Admitting: Hematology

## 2018-11-04 ENCOUNTER — Inpatient Hospital Stay (HOSPITAL_BASED_OUTPATIENT_CLINIC_OR_DEPARTMENT_OTHER): Payer: Medicare Other | Admitting: Hematology

## 2018-11-04 VITALS — BP 98/70 | HR 77 | Temp 97.9°F | Resp 17 | Ht 71.0 in | Wt 177.7 lb

## 2018-11-04 DIAGNOSIS — Z7901 Long term (current) use of anticoagulants: Secondary | ICD-10-CM

## 2018-11-04 DIAGNOSIS — I350 Nonrheumatic aortic (valve) stenosis: Secondary | ICD-10-CM | POA: Insufficient documentation

## 2018-11-04 DIAGNOSIS — I482 Chronic atrial fibrillation, unspecified: Secondary | ICD-10-CM

## 2018-11-04 DIAGNOSIS — D508 Other iron deficiency anemias: Secondary | ICD-10-CM

## 2018-11-04 DIAGNOSIS — Z79899 Other long term (current) drug therapy: Secondary | ICD-10-CM

## 2018-11-04 DIAGNOSIS — I2510 Atherosclerotic heart disease of native coronary artery without angina pectoris: Secondary | ICD-10-CM | POA: Diagnosis not present

## 2018-11-04 DIAGNOSIS — I10 Essential (primary) hypertension: Secondary | ICD-10-CM | POA: Insufficient documentation

## 2018-11-04 DIAGNOSIS — Z952 Presence of prosthetic heart valve: Secondary | ICD-10-CM | POA: Insufficient documentation

## 2018-11-04 DIAGNOSIS — D649 Anemia, unspecified: Secondary | ICD-10-CM | POA: Insufficient documentation

## 2018-11-04 DIAGNOSIS — I110 Hypertensive heart disease with heart failure: Secondary | ICD-10-CM | POA: Diagnosis not present

## 2018-11-04 DIAGNOSIS — Z7982 Long term (current) use of aspirin: Secondary | ICD-10-CM | POA: Diagnosis not present

## 2018-11-04 DIAGNOSIS — J449 Chronic obstructive pulmonary disease, unspecified: Secondary | ICD-10-CM | POA: Diagnosis not present

## 2018-11-04 DIAGNOSIS — Z87891 Personal history of nicotine dependence: Secondary | ICD-10-CM | POA: Insufficient documentation

## 2018-11-04 DIAGNOSIS — R1311 Dysphagia, oral phase: Secondary | ICD-10-CM | POA: Diagnosis not present

## 2018-11-04 DIAGNOSIS — D638 Anemia in other chronic diseases classified elsewhere: Secondary | ICD-10-CM

## 2018-11-04 DIAGNOSIS — I5033 Acute on chronic diastolic (congestive) heart failure: Secondary | ICD-10-CM | POA: Diagnosis not present

## 2018-11-04 DIAGNOSIS — I4891 Unspecified atrial fibrillation: Secondary | ICD-10-CM

## 2018-11-04 DIAGNOSIS — I4811 Longstanding persistent atrial fibrillation: Secondary | ICD-10-CM | POA: Diagnosis not present

## 2018-11-04 LAB — IRON AND TIBC
Iron: 33 ug/dL — ABNORMAL LOW (ref 42–163)
Saturation Ratios: 11 % — ABNORMAL LOW (ref 20–55)
TIBC: 308 ug/dL (ref 202–409)
UIBC: 274 ug/dL (ref 117–376)

## 2018-11-04 LAB — CMP (CANCER CENTER ONLY)
ALBUMIN: 3.3 g/dL — AB (ref 3.5–5.0)
ALK PHOS: 60 U/L (ref 38–126)
ALT: 11 U/L (ref 0–44)
ANION GAP: 7 (ref 5–15)
AST: 25 U/L (ref 15–41)
BUN: 20 mg/dL (ref 8–23)
CO2: 37 mmol/L — AB (ref 22–32)
Calcium: 9.1 mg/dL (ref 8.9–10.3)
Chloride: 97 mmol/L — ABNORMAL LOW (ref 98–111)
Creatinine: 1.28 mg/dL — ABNORMAL HIGH (ref 0.61–1.24)
GFR, Est AFR Am: 56 mL/min — ABNORMAL LOW (ref >60)
GFR, Estimated: 49 mL/min — ABNORMAL LOW (ref >60)
GLUCOSE: 85 mg/dL (ref 70–99)
Potassium: 3.6 mmol/L (ref 3.5–5.1)
Sodium: 141 mmol/L (ref 135–145)
TOTAL PROTEIN: 6.4 g/dL — AB (ref 6.5–8.1)
Total Bilirubin: 0.4 mg/dL (ref 0.3–1.2)

## 2018-11-04 LAB — CBC WITH DIFFERENTIAL/PLATELET
Abs Immature Granulocytes: 0.04 10*3/uL (ref 0.00–0.07)
BASOS ABS: 0 10*3/uL (ref 0.0–0.1)
BASOS PCT: 0 %
Eosinophils Absolute: 0.3 10*3/uL (ref 0.0–0.5)
Eosinophils Relative: 4 %
HEMATOCRIT: 32.4 % — AB (ref 39.0–52.0)
HEMOGLOBIN: 10.2 g/dL — AB (ref 13.0–17.0)
Immature Granulocytes: 1 %
LYMPHS ABS: 0.9 10*3/uL (ref 0.7–4.0)
LYMPHS PCT: 12 %
MCH: 28.4 pg (ref 26.0–34.0)
MCHC: 31.5 g/dL (ref 30.0–36.0)
MCV: 90.3 fL (ref 80.0–100.0)
MONO ABS: 0.4 10*3/uL (ref 0.1–1.0)
Monocytes Relative: 6 %
Neutro Abs: 5.7 10*3/uL (ref 1.7–7.7)
Neutrophils Relative %: 77 %
PLATELETS: 233 10*3/uL (ref 150–400)
RBC: 3.59 MIL/uL — AB (ref 4.22–5.81)
RDW: 18.3 % — AB (ref 11.5–15.5)
WBC: 7.3 10*3/uL (ref 4.0–10.5)
nRBC: 0 % (ref 0.0–0.2)

## 2018-11-04 LAB — FERRITIN: Ferritin: 30 ng/mL (ref 24–336)

## 2018-11-04 LAB — PROTIME-INR
INR: 3.16
Prothrombin Time: 32 seconds — ABNORMAL HIGH (ref 11.4–15.2)

## 2018-11-04 NOTE — Telephone Encounter (Signed)
Printed calendar and avs. °

## 2018-11-04 NOTE — Patient Instructions (Addendum)
Pre visit review using our clinic review tool, if applicable. No additional management support is needed unless otherwise documented below in the visit note.  Skip dosage today and then take 1 tablet daily.  Re-check in 6 weeks Dosing instructions given to daughter, [name].

## 2018-11-05 ENCOUNTER — Other Ambulatory Visit: Payer: Self-pay | Admitting: *Deleted

## 2018-11-05 DIAGNOSIS — I4811 Longstanding persistent atrial fibrillation: Secondary | ICD-10-CM | POA: Diagnosis not present

## 2018-11-05 DIAGNOSIS — J449 Chronic obstructive pulmonary disease, unspecified: Secondary | ICD-10-CM | POA: Diagnosis not present

## 2018-11-05 DIAGNOSIS — I2510 Atherosclerotic heart disease of native coronary artery without angina pectoris: Secondary | ICD-10-CM | POA: Diagnosis not present

## 2018-11-05 DIAGNOSIS — R1311 Dysphagia, oral phase: Secondary | ICD-10-CM | POA: Diagnosis not present

## 2018-11-05 DIAGNOSIS — I5033 Acute on chronic diastolic (congestive) heart failure: Secondary | ICD-10-CM | POA: Diagnosis not present

## 2018-11-05 DIAGNOSIS — I110 Hypertensive heart disease with heart failure: Secondary | ICD-10-CM | POA: Diagnosis not present

## 2018-11-05 NOTE — Patient Outreach (Addendum)
[facility]) Care Management  11/05/2018  [name] [name] 1925-01-01 [id number]    RN spoke with pt today and verified ongoing management of care related to his HF. Will review and discuss pt's ongoing plan of care with related goals, interventions and adjust accordingly based upon pt's progress. Pt reports daily weights with today at 166 lbs and last week at 165. Pt states [facility] remains involved with ongoing compression dressings to his LE. Pt states his legs are "almost back to a normal human being". Denies any related symptoms of HF and continues to verify he is in the GREEN zone with no acute issues at this time. RN remains receptive to ongoing follow up calls related to his management of care. Will continue to encouraged pt on his ongoing management of care and adhere to the plan of care discussed today. RN will schedule a call next week for ongoing transition of care. No needed resources at this time.   THN CM Care Plan Problem One     Most Recent Value  Care Plan Problem One  Knowledge deficit related to HF  Role Documenting the Problem One  Care Management Coordinator  Care Plan for Problem One  Active  THN Long Term Goal   Pt will have an increase knowledge base on HF and recognize early symptoms to report to provider's office within the next 90 days.  THN Long Term Goal Start Date  10/19/18  Interventions for Problem One Long Term Goal  Will continue to inquire on HF knowledge and verify pt remains in the GREEN zone with no acute symptoms. Will re-evaluate next follow up call and continue to encouraged adherence with this goal.  Will inquire on knowledge base of symptoms based upon the information that has been presented and reinforce as needed..  THN CM Short Term Goal #1   Pt will complete daily weights and document all readings for providers to view over the next 30 days  THN CM Short Term Goal #1 Start Date  10/19/18  Interventions for Short Term Goal #1  WIll  continue to encourage adherence with daily weights and verify pt's understandinf of fluid retention and why this is important to monitoring, Will verify pt's awareness of what to do if acute symptoms are encountered with swelling related to fluid retention  THN CM Short Term Goal #2   Adherence with all pending medical appointments with his providers offices post SNF discharge  [facility] CM Short Term Goal #2 Start Date  10/19/18  [facility] CM Short Term Goal #2 Met Date  11/05/18  Interventions for Short Term Goal #2  Pt continue to have sufficient transportation for his ongong medical appointments      Raina Mina, RN Care Management Coordinator [place] Office [phone]

## 2018-11-08 DIAGNOSIS — R1311 Dysphagia, oral phase: Secondary | ICD-10-CM | POA: Diagnosis not present

## 2018-11-08 DIAGNOSIS — J449 Chronic obstructive pulmonary disease, unspecified: Secondary | ICD-10-CM | POA: Diagnosis not present

## 2018-11-08 DIAGNOSIS — I5033 Acute on chronic diastolic (congestive) heart failure: Secondary | ICD-10-CM | POA: Diagnosis not present

## 2018-11-08 DIAGNOSIS — I110 Hypertensive heart disease with heart failure: Secondary | ICD-10-CM | POA: Diagnosis not present

## 2018-11-08 DIAGNOSIS — I2510 Atherosclerotic heart disease of native coronary artery without angina pectoris: Secondary | ICD-10-CM | POA: Diagnosis not present

## 2018-11-08 DIAGNOSIS — I4811 Longstanding persistent atrial fibrillation: Secondary | ICD-10-CM | POA: Diagnosis not present

## 2018-11-09 DIAGNOSIS — I4811 Longstanding persistent atrial fibrillation: Secondary | ICD-10-CM | POA: Diagnosis not present

## 2018-11-09 DIAGNOSIS — I110 Hypertensive heart disease with heart failure: Secondary | ICD-10-CM | POA: Diagnosis not present

## 2018-11-09 DIAGNOSIS — J449 Chronic obstructive pulmonary disease, unspecified: Secondary | ICD-10-CM | POA: Diagnosis not present

## 2018-11-09 DIAGNOSIS — I5033 Acute on chronic diastolic (congestive) heart failure: Secondary | ICD-10-CM | POA: Diagnosis not present

## 2018-11-09 DIAGNOSIS — R1311 Dysphagia, oral phase: Secondary | ICD-10-CM | POA: Diagnosis not present

## 2018-11-09 DIAGNOSIS — I2510 Atherosclerotic heart disease of native coronary artery without angina pectoris: Secondary | ICD-10-CM | POA: Diagnosis not present

## 2018-11-10 DIAGNOSIS — I2510 Atherosclerotic heart disease of native coronary artery without angina pectoris: Secondary | ICD-10-CM | POA: Diagnosis not present

## 2018-11-10 DIAGNOSIS — R1311 Dysphagia, oral phase: Secondary | ICD-10-CM | POA: Diagnosis not present

## 2018-11-10 DIAGNOSIS — I5033 Acute on chronic diastolic (congestive) heart failure: Secondary | ICD-10-CM | POA: Diagnosis not present

## 2018-11-10 DIAGNOSIS — I110 Hypertensive heart disease with heart failure: Secondary | ICD-10-CM | POA: Diagnosis not present

## 2018-11-10 DIAGNOSIS — J449 Chronic obstructive pulmonary disease, unspecified: Secondary | ICD-10-CM | POA: Diagnosis not present

## 2018-11-10 DIAGNOSIS — I4811 Longstanding persistent atrial fibrillation: Secondary | ICD-10-CM | POA: Diagnosis not present

## 2018-11-11 DIAGNOSIS — J449 Chronic obstructive pulmonary disease, unspecified: Secondary | ICD-10-CM | POA: Diagnosis not present

## 2018-11-11 DIAGNOSIS — I5033 Acute on chronic diastolic (congestive) heart failure: Secondary | ICD-10-CM | POA: Diagnosis not present

## 2018-11-11 DIAGNOSIS — I4811 Longstanding persistent atrial fibrillation: Secondary | ICD-10-CM | POA: Diagnosis not present

## 2018-11-11 DIAGNOSIS — I2510 Atherosclerotic heart disease of native coronary artery without angina pectoris: Secondary | ICD-10-CM | POA: Diagnosis not present

## 2018-11-11 DIAGNOSIS — R1311 Dysphagia, oral phase: Secondary | ICD-10-CM | POA: Diagnosis not present

## 2018-11-11 DIAGNOSIS — I110 Hypertensive heart disease with heart failure: Secondary | ICD-10-CM | POA: Diagnosis not present

## 2018-11-12 ENCOUNTER — Ambulatory Visit (HOSPITAL_COMMUNITY)
Admission: RE | Admit: 2018-11-12 | Discharge: 2018-11-12 | Disposition: A | Discharge status: Home / Self Care | Payer: Medicare Other | Source: Ambulatory Visit | Attending: Hematology | Admitting: Hematology

## 2018-11-12 ENCOUNTER — Other Ambulatory Visit: Payer: Self-pay | Admitting: *Deleted

## 2018-11-12 DIAGNOSIS — D509 Iron deficiency anemia, unspecified: Secondary | ICD-10-CM | POA: Diagnosis not present

## 2018-11-12 MED ORDER — SODIUM CHLORIDE 0.9 % IV SOLN
Freq: Once | INTRAVENOUS | Status: AC
  Administered 2018-11-12: 09:00:00 via INTRAVENOUS

## 2018-11-12 MED ORDER — SODIUM CHLORIDE 0.9 % IV SOLN
750.0000 mg | Freq: Once | INTRAVENOUS | Status: AC
  Administered 2018-11-12: 750 mg via INTRAVENOUS
  Filled 2018-11-12: qty 15

## 2018-11-12 NOTE — Discharge Instructions (Signed)

## 2018-11-12 NOTE — Patient Outreach (Signed)
[facility] [place] [facility]) Care Management  11/12/2018  [name] [name] [birth year] [id number]    RN spoke with pt today and update the plan of care along with discussion on his goals and interventions to assist with obtaining these goals. Note pt reports he has not been weighing everyday but the last weight on 12/14 was 168 lbs. RN has verified pt is in the GREEN zone other then some small swelling to his LE. RN stress the importance of daily weights and how fluid retention can exacerbate his HF symptoms if not monitored. Pt verbalized an understanding and indicated he would start back weighing daily. Pt states he has a new [place] called [facility] for a visiting RN/PT services. States the nurse visited today and PT on yesterdays with mention of good progress.   RN inquired on any needed resources for his ongoing management of care however pt declined indicating his daughter assist with most of his care along with the involved [place] ([facility]).  Pt remains receptive to managing his care via telephonically and RN will follow up in one week with ongoing transition of care services for this pt. The following is the update care plan. RN will scheduled another call next week for HF management.  THN CM Care Plan Problem One     Most Recent Value  Care Plan Problem One  Knowledge deficit related to HF  Role Documenting the Problem One  Care Management Coordinator  Care Plan for Problem One  Active  THN Long Term Goal   Pt will have an increase knowledge base on HF and recognize early symptoms to report to provider's office within the next 90 days.  THN Long Term Goal Start Date  10/19/18  Interventions for Problem One Long Term Goal  Will extend to allow ongoing adherence as pt not able to recite information on signs and symptoms and when to contact her provider. Will reiterate on symptoms and when to contact his provider. Will stress the importance of ongoing management of care.   THN  CM Short Term Goal #1   Pt will complete daily weights and document all readings for providers to view over the next 30 days  THN CM Short Term Goal #1 Start Date  10/19/18  Interventions for Short Term Goal #1  Will strongly encouraged to complete daily weight  to prevent acute events from occuring. WIll extend this goal to allow ongoing adherence as pt has not weighed daily. WIll reiterate on if pt gains 3 lbs over night or 5 lbs within one week to contact his provider for possible interventions.     Raina Mina, RN Care Management Coordinator [place] Office [phone]

## 2018-11-12 NOTE — Progress Notes (Signed)
Patient received  Injectafer via PIV as ordered. Observed for at least 30 minutes post infusion.Tolerated well, vitals stable, discharge instructions given, verbalized understanding. Patient alert, oriented and left in a wheel chair with his son  at the time of discharge.

## 2018-11-15 DIAGNOSIS — J449 Chronic obstructive pulmonary disease, unspecified: Secondary | ICD-10-CM | POA: Diagnosis not present

## 2018-11-15 DIAGNOSIS — I5033 Acute on chronic diastolic (congestive) heart failure: Secondary | ICD-10-CM | POA: Diagnosis not present

## 2018-11-15 DIAGNOSIS — I110 Hypertensive heart disease with heart failure: Secondary | ICD-10-CM | POA: Diagnosis not present

## 2018-11-15 DIAGNOSIS — I4811 Longstanding persistent atrial fibrillation: Secondary | ICD-10-CM | POA: Diagnosis not present

## 2018-11-15 DIAGNOSIS — I2510 Atherosclerotic heart disease of native coronary artery without angina pectoris: Secondary | ICD-10-CM | POA: Diagnosis not present

## 2018-11-15 DIAGNOSIS — R1311 Dysphagia, oral phase: Secondary | ICD-10-CM | POA: Diagnosis not present

## 2018-11-16 DIAGNOSIS — R1311 Dysphagia, oral phase: Secondary | ICD-10-CM | POA: Diagnosis not present

## 2018-11-16 DIAGNOSIS — I4811 Longstanding persistent atrial fibrillation: Secondary | ICD-10-CM | POA: Diagnosis not present

## 2018-11-16 DIAGNOSIS — I110 Hypertensive heart disease with heart failure: Secondary | ICD-10-CM | POA: Diagnosis not present

## 2018-11-16 DIAGNOSIS — J449 Chronic obstructive pulmonary disease, unspecified: Secondary | ICD-10-CM | POA: Diagnosis not present

## 2018-11-16 DIAGNOSIS — I2510 Atherosclerotic heart disease of native coronary artery without angina pectoris: Secondary | ICD-10-CM | POA: Diagnosis not present

## 2018-11-16 DIAGNOSIS — I5033 Acute on chronic diastolic (congestive) heart failure: Secondary | ICD-10-CM | POA: Diagnosis not present

## 2018-11-18 ENCOUNTER — Other Ambulatory Visit: Payer: Self-pay | Admitting: *Deleted

## 2018-11-18 NOTE — Patient Outreach (Addendum)
[place] [facility]) Care Management  11/18/2018  [name] [name] 1925/07/29 [id number]  Transition of Care   RN spoke with pt today and received update on his ongoing management. Pt states she completed his daily weights and reports his weight remains at 175 lbs. States [facility] PT continues to work with him. Pt states he is improving with 2-3 X weekly. Denies any related symptoms of HF as discussed today with no SOB, only a dry cough upon arising in the AM and a small amount of fluid to his LE which is "much improved", states pt. RN reiterated on the plan of care and discussed all goals and interventions. Pt states if he gets into trouble he now has an emergency alert medical button and will push the button for assistance if needed. Pt with clear understanding on the adjusted interventions and RN continue to offer tools and resources that may be considered for ongoing management of care.  Pt very appreciative and receptive to a follow up call next week as pt continues to manage his care well independently. No needs at this time as RN will send a [place] or ongoing documentation of all his weights for his providers to view. Will update missing elements for AD (conpleted).  Will follow up next month with ongoing case management services and re-evaluate pt's plan of care related to HF.  THN CM Care Plan Problem One     Most Recent Value  Care Plan Problem One  Knowledge deficit related to HF  Role Documenting the Problem One  Care Management Coordinator  Care Plan for Problem One  Active  THN Long Term Goal   Pt will have an increase knowledge base on HF and recognize early symptoms to report to provider's office within the next 90 days.  THN Long Term Goal Start Date  10/19/18  Interventions for Problem One Long Term Goal  Will continue to educate and extend this goal to allow adherence with learning what to do if acute symptoms should occur. Reiterate on HF zones and the importance  of managing this condition to reduce the risk of hospitalization.   THN CM Short Term Goal #1   Pt will complete daily weights and document all readings for providers to view over the next 30 days  THN CM Short Term Goal #1 Start Date  10/19/18  Interventions for Short Term Goal #1  Will re-educate pt on the importance of daily weights related to fluid retention and how this affects exacerbated symptoms. Will also discuss what to do if symtpoms are acute.       Raina Mina, RN Care Management Coordinator [place] Office [phone]

## 2018-11-19 ENCOUNTER — Ambulatory Visit (HOSPITAL_COMMUNITY)
Admission: RE | Admit: 2018-11-19 | Discharge: 2018-11-19 | Disposition: A | Discharge status: Home / Self Care | Payer: Medicare Other | Source: Ambulatory Visit | Attending: Hematology | Admitting: Hematology

## 2018-11-19 DIAGNOSIS — D509 Iron deficiency anemia, unspecified: Secondary | ICD-10-CM | POA: Diagnosis not present

## 2018-11-19 MED ORDER — SODIUM CHLORIDE 0.9 % IV SOLN
Freq: Once | INTRAVENOUS | Status: AC
  Administered 2018-11-19: 08:00:00 via INTRAVENOUS

## 2018-11-19 MED ORDER — SODIUM CHLORIDE 0.9 % IV SOLN
750.0000 mg | Freq: Once | INTRAVENOUS | Status: AC
  Administered 2018-11-19: 750 mg via INTRAVENOUS
  Filled 2018-11-19: qty 15

## 2018-11-19 NOTE — Discharge Instructions (Signed)

## 2018-11-19 NOTE — Progress Notes (Signed)
PATIENT CARE CENTER NOTE  Diagnosis: Iron Deficiency Anemia    Provider: Dr. Irene Limbo   Procedure: IV Injectafer    Note: Patient received infusion of Injectafer. Observed patient for 30 minutes post-infusion. Tolerated well with no adverse reaction. Vital signs stable. Discharge instructions given. Patient alert, oriented and ambulatory to wheelchair at discharge.

## 2018-11-22 DIAGNOSIS — I110 Hypertensive heart disease with heart failure: Secondary | ICD-10-CM | POA: Diagnosis not present

## 2018-11-22 DIAGNOSIS — I2510 Atherosclerotic heart disease of native coronary artery without angina pectoris: Secondary | ICD-10-CM | POA: Diagnosis not present

## 2018-11-22 DIAGNOSIS — J449 Chronic obstructive pulmonary disease, unspecified: Secondary | ICD-10-CM | POA: Diagnosis not present

## 2018-11-22 DIAGNOSIS — I4811 Longstanding persistent atrial fibrillation: Secondary | ICD-10-CM | POA: Diagnosis not present

## 2018-11-22 DIAGNOSIS — R1311 Dysphagia, oral phase: Secondary | ICD-10-CM | POA: Diagnosis not present

## 2018-11-22 DIAGNOSIS — I5033 Acute on chronic diastolic (congestive) heart failure: Secondary | ICD-10-CM | POA: Diagnosis not present

## 2018-11-23 DIAGNOSIS — R1311 Dysphagia, oral phase: Secondary | ICD-10-CM | POA: Diagnosis not present

## 2018-11-23 DIAGNOSIS — I2510 Atherosclerotic heart disease of native coronary artery without angina pectoris: Secondary | ICD-10-CM | POA: Diagnosis not present

## 2018-11-23 DIAGNOSIS — I4811 Longstanding persistent atrial fibrillation: Secondary | ICD-10-CM | POA: Diagnosis not present

## 2018-11-23 DIAGNOSIS — I110 Hypertensive heart disease with heart failure: Secondary | ICD-10-CM | POA: Diagnosis not present

## 2018-11-23 DIAGNOSIS — I5033 Acute on chronic diastolic (congestive) heart failure: Secondary | ICD-10-CM | POA: Diagnosis not present

## 2018-11-23 DIAGNOSIS — J449 Chronic obstructive pulmonary disease, unspecified: Secondary | ICD-10-CM | POA: Diagnosis not present

## 2018-11-25 ENCOUNTER — Telehealth: Payer: Self-pay | Admitting: *Deleted

## 2018-12-10 ENCOUNTER — Encounter: Payer: Self-pay | Admitting: Thoracic Surgery (Cardiothoracic Vascular Surgery)

## 2018-12-10 IMAGING — DX DG CHEST 2V
2 series · 2 of 2 positions shown · non-contrast
Comparison: Chest x-ray dated 09/14/2018 and 09/10/2018 and
04/20/2018

CLINICAL DATA: Cough and wheezing.

EXAM:
CHEST - 2 VIEW

[w chest lat]
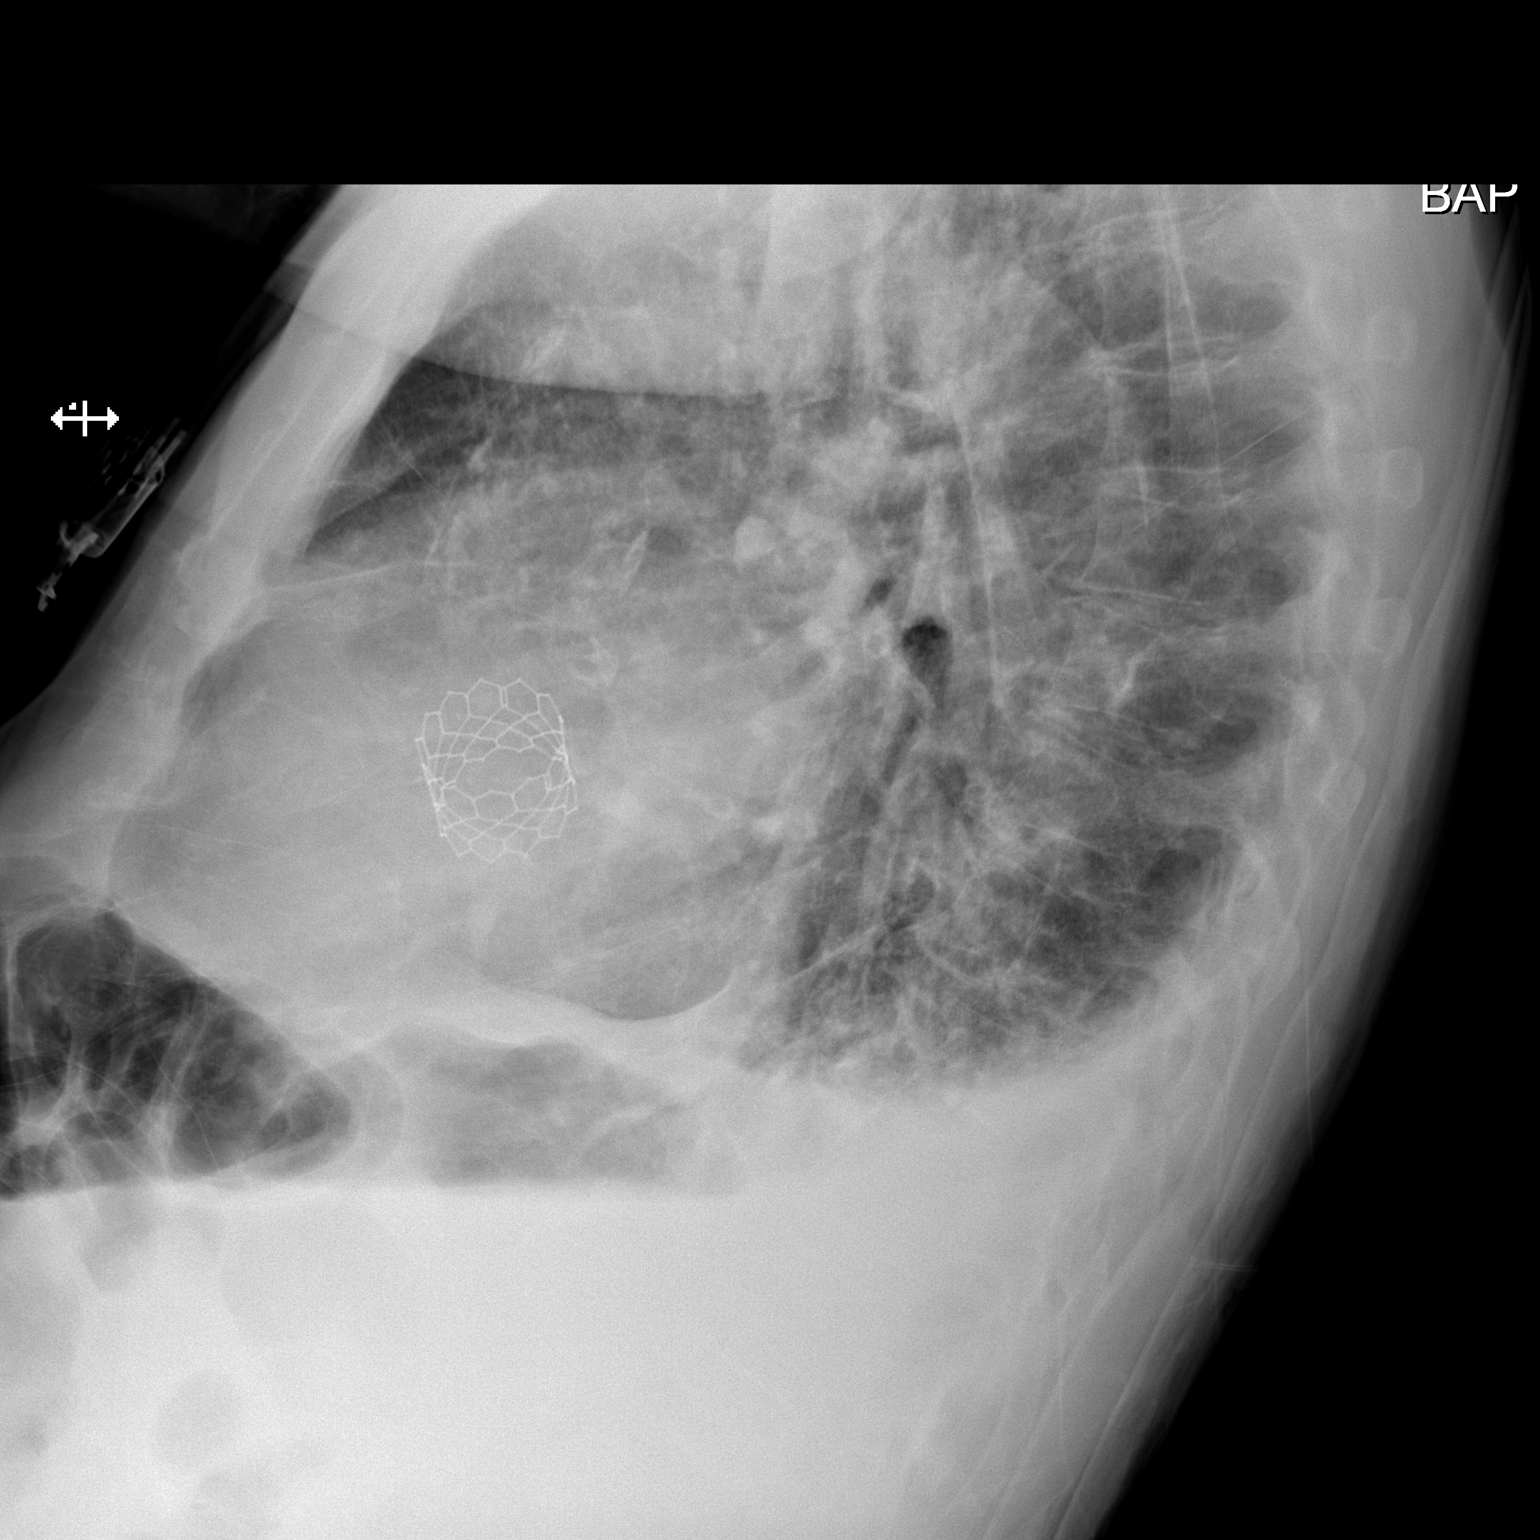

[x chest ap]
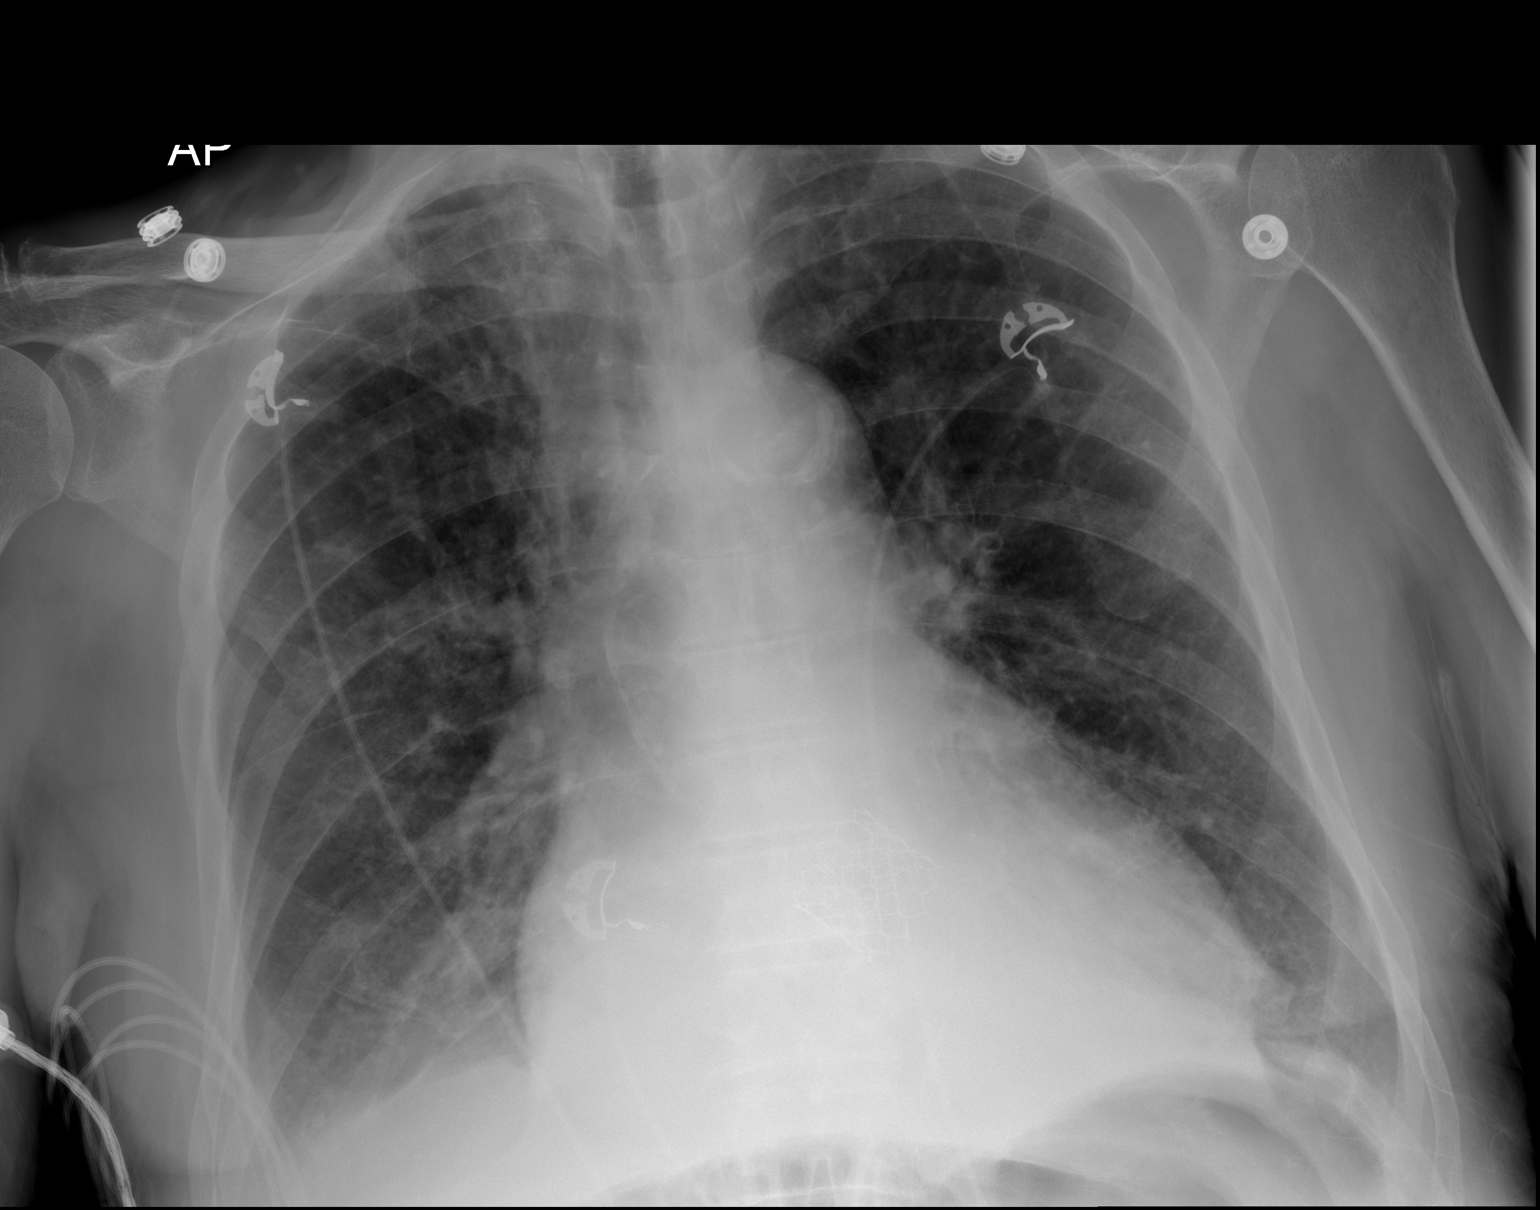

[2 of 2 positions shown; findings below may reference images not displayed]

FINDINGS: Chronic cardiomegaly. TAVR. Tortuosity and calcification of the
thoracic aorta. Small bilateral pleural effusions. Pulmonary
vascularity is within normal limits but more prominent than on the
prior exams. New slight peribronchial thickening.
IMPRESSION: Findings most likely secondary to congestive heart failure with
small bilateral pleural effusions, increased pulmonary vascularity
as compared to prior studies, and slight peribronchial thickening
which could represent bronchitis edema.

Aortic Atherosclerosis ([id number]-XIQ.Q).

## 2018-12-15 ENCOUNTER — Ambulatory Visit (INDEPENDENT_AMBULATORY_CARE_PROVIDER_SITE_OTHER): Payer: Medicare Other | Admitting: General Practice

## 2018-12-15 DIAGNOSIS — I4891 Unspecified atrial fibrillation: Secondary | ICD-10-CM

## 2018-12-15 DIAGNOSIS — Z7901 Long term (current) use of anticoagulants: Secondary | ICD-10-CM

## 2018-12-15 LAB — POCT INR: INR: 1.9 — AB (ref 2.0–3.0)

## 2018-12-15 NOTE — Patient Instructions (Signed)
Pre visit review using our clinic review tool, if applicable. No additional management support is needed unless otherwise documented below in the visit note.  Take 2 tablets today (1/22) and then continue to take 1 (5 mg) tablet daily except 1 1/2 tablets (7.5 mg) on Monday/Wed/Fridays.  Re-check in 6 weeks   Daughter with patient today at visit.

## 2018-12-16 NOTE — Progress Notes (Signed)
I have reviewed and agree with this plan  

## 2018-12-17 ENCOUNTER — Other Ambulatory Visit: Payer: Self-pay | Admitting: *Deleted

## 2018-12-17 NOTE — Patient Outreach (Signed)
[facility]) Care Management  12/17/2018  AN SCHNABEL 14-Jul-1925 [id number]   RN spoke with pt today and received update on his ongoing management of care. Pt reports he slept late this morning and has not weighed yet but his weights remain at 175 lb. States he continues to take all his prescribed medications and his daughters assist with transporting him to all him medical appointments. Pt without complaints today and continue to manage his care independently with assistance from his daughters. Reports ongoing swelling history and that this has improved with no changes in his weights. Pt able to recite some symptoms of HF and verifies he is in the GREEN zone other then the history of swelling throughout the day that comes to improve. No symptoms reported as pt continue to administer his Mucinex to eliminate congestion.   Plan of care discussed with goals and intervention that were adjusted accordingly based upon pt's progress. RN will continue to encouraged adherence with this plan and follow up next month with ongoing case management services. Discussed possible transitioning to a health coach with [facility] within the next few months. Pt receptive to this plan.  THN CM Care Plan Problem One     Most Recent Value  Care Plan Problem One  Knowledge deficit related to HF  Role Documenting the Problem One  Care Management Coordinator  Care Plan for Problem One  Active  THN Long Term Goal   Pt will have an increase knowledge base on HF and recognize early symptoms to report to provider's office within the next 90 days.  THN Long Term Goal Start Date  10/19/18  Interventions for Problem One Long Term Goal  Will verified pt's understanding on the importance of monitoring his HF. Will verify zone and strongly encouraged pt awareness on who to contact for early intervention to prevent hospital admission. Will extend to allow pt adherence with monitoring this condition.  THN CM Short Term  Goal #1   Pt will complete daily weights and document all readings for providers to view over the next 30 days  THN CM Short Term Goal #1 Start Date  10/19/18  [facility] CM Short Term Goal #1 Met Date  12/17/18      Raina Mina, RN Care Management Coordinator [place] Office [phone]

## 2018-12-20 NOTE — Telephone Encounter (Signed)
12/20/2018 Spoke with [name] about bringing dad in for blood work and few questions for the Envisage study.  Charlene to call back with when they can come.

## 2018-12-21 NOTE — Telephone Encounter (Signed)
LM on VM about seeing pt in clinic. We can see him across the street when he follows with cardiology, in March.

## 2019-01-17 ENCOUNTER — Other Ambulatory Visit: Payer: Self-pay | Admitting: *Deleted

## 2019-01-17 NOTE — Patient Outreach (Signed)
[name] [name]) Care Management  01/17/2019  [name] [name] 05/04/1925 [id number]    Spoke with pt today and received an update on his ongoing management of care . Pt continues to do well with no acute issues. RN verifies pt remains in the GREEN zone however continues to have "a little swelling to his lower legs"(history). RN reiterated on the HF zones and stress to ongoing importance of early interventions with any signs that are presented. Pt verbalized an understanding and again denies any other symptoms at this time. RN able to verify no changes in his medications and pt continue to be adherent with all prescribed medications and medical appointments scheduled with sufficient transportation source. No needs for any community resources at this time as pt again managing his medical issues well with no encountered issues. Discussed the current plan of care and all goals that have been met will discussed transfer to a Health Coach with [facility] based upon pt's progress. Pt receptive and remains clear on the currently interventions updated today. No other questions, inquires or request at this time as pt's primary provider will be updated on pt's disposition with [facility] services.  THN CM Care Plan Problem One     Most Recent Value  Care Plan Problem One  Knowledge deficit related to HF  Role Documenting the Problem One  Care Management Coordinator  Care Plan for Problem One  Active  THN Long Term Goal   Pt will have an increase knowledge base on HF and recognize early symptoms to report to provider's office within the next 90 days.  THN Long Term Goal Start Date  10/19/18  Interventions for Problem One Long Term Goal  Will verify pt continues to progress recognizing the possible signs and symptoms that he may encounter. Will verify pt is aware of what to do if acute symptoms could occur. Will reiterated on the HF zones for pt's knowledge base and continue to receive feedback on pt's knowledge  level. Will continue to enforce pt's adherence with managing this condition and introduce a Health Coach for ongoing [facility] services based upon pt's progress.  pt has agreed to these terms and requested a calendar for his ongoing documentation for his daily weights. Will mail out his request and encouraged daily recording of his weights for his providers to view.       Raina Mina, RN Care Management Coordinator [place] Office [phone]

## 2019-01-24 ENCOUNTER — Other Ambulatory Visit: Payer: Self-pay

## 2019-01-26 ENCOUNTER — Ambulatory Visit: Payer: Medicare Other

## 2019-02-02 ENCOUNTER — Ambulatory Visit (INDEPENDENT_AMBULATORY_CARE_PROVIDER_SITE_OTHER): Payer: Medicare Other | Admitting: General Practice

## 2019-02-02 ENCOUNTER — Other Ambulatory Visit: Payer: Self-pay

## 2019-02-02 DIAGNOSIS — Z7901 Long term (current) use of anticoagulants: Secondary | ICD-10-CM

## 2019-02-02 DIAGNOSIS — I4891 Unspecified atrial fibrillation: Secondary | ICD-10-CM

## 2019-02-02 LAB — POCT INR: INR: 2 (ref 2.0–3.0)

## 2019-02-02 NOTE — Patient Instructions (Addendum)
Pre visit review using our clinic review tool, if applicable. No additional management support is needed unless otherwise documented below in the visit note.  Continue to take 1 (5 mg) tablet daily except 1 1/2 tablets (7.5 mg) on Monday/Wed/Fridays.  Re-check in 6 weeks   Daughter with patient today at visit.  

## 2019-02-02 NOTE — Progress Notes (Signed)
I have reviewed and agree with this plan  

## 2019-02-07 ENCOUNTER — Ambulatory Visit (INDEPENDENT_AMBULATORY_CARE_PROVIDER_SITE_OTHER): Payer: Medicare Other | Admitting: Cardiology

## 2019-02-07 ENCOUNTER — Encounter: Payer: Self-pay | Admitting: Cardiology

## 2019-02-07 ENCOUNTER — Other Ambulatory Visit: Payer: Self-pay

## 2019-02-07 VITALS — BP 118/62 | HR 46 | Ht 71.5 in | Wt 189.6 lb

## 2019-02-07 DIAGNOSIS — I5042 Chronic combined systolic (congestive) and diastolic (congestive) heart failure: Secondary | ICD-10-CM | POA: Diagnosis not present

## 2019-02-07 DIAGNOSIS — I350 Nonrheumatic aortic (valve) stenosis: Secondary | ICD-10-CM | POA: Diagnosis not present

## 2019-02-07 DIAGNOSIS — I428 Other cardiomyopathies: Secondary | ICD-10-CM | POA: Diagnosis not present

## 2019-02-07 DIAGNOSIS — I2510 Atherosclerotic heart disease of native coronary artery without angina pectoris: Secondary | ICD-10-CM

## 2019-02-07 DIAGNOSIS — Z952 Presence of prosthetic heart valve: Secondary | ICD-10-CM

## 2019-02-07 DIAGNOSIS — I4821 Permanent atrial fibrillation: Secondary | ICD-10-CM | POA: Diagnosis not present

## 2019-02-07 NOTE — Patient Instructions (Signed)
Medication Instructions:  Your provider recommends that you continue on your current medications as directed. Please refer to the Current Medication list given to you today.    Labwork: TODAY: BMET, BNP  Testing/Procedures: Your provider has requested that you have an echocardiogram in 3 months. Echocardiography is a painless test that uses sound waves to create images of your heart. It provides your doctor with information about the size and shape of your heart and how well your heart's chambers and valves are working. This procedure takes approximately one hour. There are no restrictions for this procedure.    Follow-Up: Your provider recommends that you schedule a follow-up appointment in 3 months with Dr. Meda Coffee.

## 2019-02-07 NOTE — Progress Notes (Signed)
[facility]    Cardiology Office Note    Date:  02/07/2019   ID:  [name] [name], DOB 05-Jun-1925, MRN [id number] PCP:  [name] Peng, NP  Cardiologist:  Dr. Meda [name] / Dr. Burt [name] & Dr. Roxy [name] (TAVR)   CC: 3 months follow-up  History of Present Illness:  [name] [name] is a 85 y.o. male with a history of CAD, chronic diastolic CHF, longstanding persistant atrial fibrillation on coumadin, carotid artery disease with CTO RICA, history of TIA, HTN, HLD, hypothyroidism, urinary incontinence and severe AS s/p TAVR (09/14/18) who presents to clinic for follow up.      Echocardiograms have demonstrated the presence of low normal left ventricular systolic function with aortic stenosis that has gradually progressed in severity. Echocardiogram performed 03/02/2018 revealed severe aortic stenosis. Peak velocity across aortic valve measured 4.4 m/s corresponding to mean transvalvular gradient estimated 43 mmHg. The DVI was reportedly only 0.18. Left ventricular systolic function remained stable ~50 to 55%. In 03/2018 he was evaluated in the emergency department with a brief episode of chest pain relieved by nitroglycerin. Troponins were negative. He was seen in follow-up by [name] [name] in July and subsequently the patient was referred to the multidisciplinary heart valve clinic for evaluation of his aortic stenosis. L/RHC 07/21/18 revealed coronary artery disease with 70% proximal stenosis of the left anterior descending coronary artery and otherwise moderate nonobstructive disease. Right heart pressures and cardiac output were normal. Medical therapy was recommended.    He ultimately underwent successful TAVR with a 29 mm Edwards Sapien 3 THV via the TF approach on 09/14/18. Post operative echoshowed EF 55%, normally functioning TAVR with mean gradient of 8 mm Hg and no PVL. ECG with afib with new LBBB but no high grade heart block. He was enrolled in the Envisage trial and randomized  back to Coumadin. He was discharged on Aspirin and resumed on home coumadin. On the day of discharge he had a worsening cough and wheezing. CXR showed pleural effusions and possible bronchitis. He was treated with IV lasix and oral doxycycline. He was discharged on lasix 44m BID. [facility]      Was seen in the structural heart/TAVR clinic in November and was doing well.  Echocardiogram performed that they showed normal transvalvular gradients however significant drop in LVEF from 55 to 60% to 25 to 30%.  02/07/2019 -the patient is coming after 4 months, he has been doing well, he is now back living at home.  He denies any chest pain or dyspnea other than on moderate exertion, he walks with a walker, he has not had any falls since TAVR (of note 3 shortly prior to TAVR), he also states that despite having significant lower extremity edema these has been progressively improving ever since his procedure.  He has been compliant with his medications and has no blood in his urine or stool very occasional nosebleeds unchanged from before the procedure.   Past Medical History:  Diagnosis Date   Atrial fibrillation, chronic    a. on coumadin    Carotid artery occlusion    right total internal artery occlusion   [place]ronic [place] CH[place]ive heart [place]    Coronary artery disease    s/p cardiac cath in 1990s, and cardiolite study  in 2005 showing EF 54%   Degenerative disc disease    cervical spine   GERD (gastroesophageal reflux disease)    HIV infection ([place]    Hyperlipidemia  Hypertension    Hypothyroidism    Rheumatoid arthritis ([place])    S/P TAVR (transcatheter aortic valve replacement)    Edwards Sapien 3 THV (size 29 mm) via the R TF approach    Skin cancer    forehead   Stroke (HCC)    TIA    TIA (transient ischemic attack)    Urgency incontinence     Past Surgical History:  Procedure Laterality Date   AMPUTATION Right  02/21/2015   Procedure: Right 3rd Ray Amputation;  Surgeon: Newt Minion, MD;  Location: [facility];  Service: Orthopedics;  Laterality: Right;  Digital block and ankle block with MAC   CARDIAC CATHETERIZATION  1990s   CATARACT EXTRACTION W/ INTRAOCULAR LENS  IMPLANT, BILATERAL     EYE SURGERY     cataracts bilateral   INTRAOPERATIVE TRANSTHORACIC ECHOCARDIOGRAM  09/14/2018   Procedure: INTRAOPERATIVE TRANSTHORACIC ECHOCARDIOGRAM;  Surgeon: [name] Mocha, MD;  Location: [place];  Service: Open Heart Surgery;;   RIGHT/LEFT HEART CATH AND CORONARY ANGIOGRAPHY [name] [name] 07/21/2018   Procedure: RIGHT/LEFT HEART CATH AND CORONARY ANGIOGRAPHY;  Surgeon: [name] Mocha, MD;  Location: [place] [facility];  Service: Cardiovascular;  Laterality: [name] [name];   SKIN CANCER EXCISION  12/2011   forehead   TRANSCATHETER AORTIC VALVE REPLACEMENT, TRANSFEMORAL [name] [name] 09/14/2018   Procedure: TRANSCATHETER AORTIC VALVE REPLACEMENT, TRANSFEMORAL. 76m EDWARDS SAPIEN3 THV.;  Surgeon: CSherren Mocha MD;  [place]  Service: Open Heart Surgery;  Laterality: [name] [name];    Current Medications: Outpatient Medications Prior to Visit  Medication Sig Dispense Refill   aspirin 81 MG tablet Take 81 mg by mouth at bedtime.     atorvastatin (LIPITOR) 10 MG tablet TAKE 1 TABLET AT BEDTIME 90 tablet 3   Calcium-Vitamin D (CALTRATE 600 PLUS-VIT D PO) Take 1 tablet by mouth at bedtime.      docusate sodium (COLACE) 100 MG capsule Take 1 capsule (100 mg total) by mouth daily as needed for mild constipation. 30 capsule 6   folic acid (FOLVITE) 1 MG tablet Take 1 mg by mouth daily.      furosemide (LASIX) 40 MG tablet Take 2 tablets (80 mg total) by mouth every morning. 180 tablet 3   guaiFENesin (MUCINEX) 600 MG 12 hr tablet Take 600-1,200 mg by mouth 2 (two) times daily as needed (cold/congestion.).     methotrexate (RHEUMATREX) 2.5 MG tablet Take 10 mg by mouth once a week. Caution:Chemotherapy. Protect from light.     Multiple  Vitamin (MULTIVITAMIN WITH MINERALS) TABS tablet Take 1 tablet by mouth daily.     nitroGLYCERIN (NITROSTAT) 0.4 MG SL tablet Place 1 tablet (0.4 mg total) under the tongue every 5 (five) minutes as needed for chest pain. 25 tablet 0   potassium chloride SA (K-DUR,KLOR-CON) 20 MEQ tablet Take 2 tablets (40 mEq total) by mouth every morning. 180 tablet 3   Probiotic Product (HEALTHY COLON PO) Take 1 capsule by mouth daily.     tamsulosin (FLOMAX) 0.4 MG CAPS capsule Take 0.4 mg by mouth every evening.     TOPROL XL 25 MG 24 hr tablet TAKE ONE-HALF TABLET (12.5 MG TOTAL) DAILY WITH OR IMMEDIATELY FOLLOWING A MEAL (DOSE CHANGE) 45 tablet 2   warfarin (COUMADIN) 5 MG tablet Take 1 tablet daily except 1 1/2 Mon Wed Fri or TAKE AS DIRECTED BY ANTICOAGULATION CLINIC (Patient taking differently: Take 5-7.5 mg by mouth See admin instructions. Take 1.5 tablets (7.5 mg) by mouth on Mondays, Wednesdays, & Fridays. Take 1 tablet (5  mg) by mouth on Sundays, Tuesdays, Thursdays, & Saturdays.) 120 tablet 1   metolazone (ZAROXOLYN) 2.5 MG tablet Take 1 tablet (2.5 mg total) by mouth as needed. As needed for weight gain >3 lbs or worsening swelling. Take extra potassium with Metolazone 36 tablet 3   doxycycline (VIBRAMYCIN) 100 MG capsule Take 1 capsule (100 mg total) by mouth 2 (two) times daily. (Patient not taking: Reported on 02/07/2019) 10 capsule 0   No facility-administered medications prior to visit.      Allergies:   Levofloxacin and Klor-con [potassium chloride er]   Social History   Socioeconomic History   Marital status: Married    Spouse name: Not on file   Number of children: Not on file   Years of education: Not on file   Highest education level: Not on file  Occupational History   Not on file  Social Needs   Financial resource strain: Not on file   Food insecurity:    Worry: Not on file    Inability: Not on file   Transportation needs:    Medical: Not on file     Non-medical: Not on file  Tobacco Use   Smoking status: Former Smoker    Packs/day: 1.00    Years: 40.00    Pack years: 40.00    Types: Cigarettes    Last attempt to quit: 09/12/1989    Years since quitting: 29.4   Smokeless tobacco: Never Used   Tobacco comment: quit around 1992  Substance and Sexual Activity   Alcohol use: No    Alcohol/week: 0.0 standard drinks    Comment: former, quit drinking in 1992   Drug use: No   Sexual activity: Not Currently  Lifestyle   Physical activity:    Days per week: Not on file    Minutes per session: Not on file   Stress: Not on file  Relationships   Social connections:    Talks on phone: Not on file    Gets together: Not on file    Attends religious service: Not on file    Active member of club or organization: Not on file    Attends meetings of clubs or organizations: Not on file    Relationship status: Not on file  Other Topics Concern   Not on file  Social History Narrative   Lives in [place] with his wife who has dementia, he is her primary caregiver   4 daughters    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      ROS:   Please see the history of present illness.    ROS All other systems reviewed and are negative.   PHYSICAL EXAM:   VS:  BP 118/62    Pulse (!) 46    Ht 5' 11.5" (1.816 m)    Wt 189 lb 9.6 oz (86 kg)    SpO2 97%    BMI 26.08 kg/m    GEN: Well nourished, well developed, in no acute distress HEENT: normal Neck: no JVD or masses Cardiac: irreg irreg; soft murmur. no rubs, or gallops, 1+ bilateral LE edema with chronic venous stasis changes.  Respiratory:  clear to auscultation bilaterally, normal work of breathing GI: soft, nontender, nondistended, +  BS MS: no deformity  or atrophy Skin: warm and dry, no rash Neuro:  Alert and Oriented x 3, Strength and sensation are intact Psych: euthymic mood, full affect  Wt Readings from Last 3 Encounters:  02/07/19 189 lb 9.6 oz (86 kg)  11/04/18 177 lb 11.2 oz (80.6 kg)  10/14/18 165 lb (74.8 kg)    Studies/Labs Reviewed:   EKG:  EKG is ordered today.  The ekg ordered today demonstrates aifb with PVCs, HR 67, LBBB  Recent Labs: 03/02/2018: NT-Pro BNP 2,507 07/14/2018: TSH 5.28 09/10/2018: B Natriuretic Peptide 256.7 09/15/2018: Magnesium 1.8 11/04/2018: ALT 11; BUN 20; Creatinine 1.28; Hemoglobin 10.2; Platelets 233; Potassium 3.6; Sodium 141   Lipid Panel    Component Value Date/Time   CHOL 106 05/23/2015 0958   TRIG 89.0 05/23/2015 0958   TRIG 25 03/31/2010   HDL 34.40 (L) 05/23/2015 0958   CHOLHDL 3 05/23/2015 0958   VLDL 17.8 05/23/2015 0958   LDLCALC 54 05/23/2015 0958    Additional studies/ records that were reviewed today include:   TAVR OPERATIVE NOTE   Date of Procedure:09/14/2018    Preoperative Diagnosis:Severe Aortic Stenosis    Postoperative Diagnosis:Same    Procedure:     Transcatheter Aortic Valve Replacement - PercutaneousRightTransfemoral Approach  Edwards Sapien 3 THV (size 59m, model # 9600TFX, serial # [id number]    Co-Surgeons:Michael CBurt Knack MD andClarence H. ORoxy Manns MD      Pre-operative Echo Findings:  - Severe aortic stenosis  - Normalleft ventricular systolic function    Post-operative Echo Findings:  - Noparavalvular leak  - Unchangedleft ventricular systolic function    __________________    Post operative echo 09/15/18:  Study Conclusions  - Left ventricle: The cavity size was normal. Wall thickness was  increased in a pattern of mild LVH. Systolic function was normal.  The estimated ejection fraction was in the range of 55% to 65%.  Wall  motion was normal; there were no regional wall motion  abnormalities.  - Aortic valve: A bioprosthesis was present. Valve area (VTI): 2.35  cm^2. Valve area (Vmax): 2.53 cm^2. Valve area (Vmean): 2.4 cm^2.  - Mitral valve: There was mild regurgitation.  - Left atrium: The atrium was mildly dilated.  - Right atrium: The atrium was moderately dilated.  - Pulmonary arteries: Systolic pressure was moderately increased.  PA peak pressure: 52 mm Hg (S).    ________________  Echo 10/14/18 Study Conclusions  - Left ventricle: The cavity size was normal. Wall thickness was   increased in a pattern of mild LVH. Systolic function was   severely reduced. The estimated ejection fraction was in the   range of 25% to 30%. - Aortic valve: A bioprosthesis was present. There was trivial   regurgitation. Valve area (VTI): 2.43 cm^2. Valve area (Vmax):   2.31 cm^2. Valve area (Vmean): 2.5 cm^2. - Mitral valve: There was mild to moderate regurgitation. - Left atrium: The atrium was severely dilated. - Right ventricle: The cavity size was mildly dilated. - Right atrium: The atrium was severely dilated. - Tricuspid valve: There was mild-moderate regurgitation.  ASSESSMENT & PLAN:   Severe AS s/p TAVR: echo today shows a significant drop in his LVEF from 55-65% to 25-30% with a normally functioning TAVR with mean gradient of  6 mm Hg and trivial PVL.  He has NYHA class IIb symptoms and doing significantly better since TAVR. SBE prophylaxis discussed. ASA can be discontinued after 6 months of therapy 02/2019.    New LV dysfunction: will have Dr. CBurt Knackand  Dr. Meda [name] review echo. Peri procedural echo reported EF of 45%, but otherwise Ef has been in range of 50-65%. His volume status looks the best it has in months (LE edema much improved). Continue on Toprol XL 12.5 mg daily.  I will obtain BNP and BMP today and see if I can increase diuretics, ideally I would like to add spironolactone however  concerned with soft blood pressure.  We will see patient again in 3 months and repeat echocardiogram prior to that appointment.  HTN: BP well controlled today.    Chronic afib: rate well controlled. Continue coumadin for thromboembolic prophylaxis.  EKG today shows atrial fibrillation with ventricular rate 70 bpm and left bundle branch block unchanged from prior.  CAD: pre TAVR cath showed calcified coronaries with mild non obst CAD and mod CAD in LAD (70% pLAD stenosis). Medical therapy was recommended.    Deconditioning: He is doing better, he continues to live independently with help of his relatives and visiting nurse.   Medication Adjustments/Labs and Tests Ordered: Current medicines are reviewed at length with the patient today.  Concerns regarding medicines are outlined above.  Medication changes, Labs and Tests ordered today are listed in the Patient Instructions below. Patient Instructions  Medication Instructions:  Your provider recommends that you continue on your current medications as directed. Please refer to the Current Medication list given to you today.    Labwork: TODAY: BMET, BNP  Testing/Procedures: Your provider has requested that you have an echocardiogram in 3 months. Echocardiography is a painless test that uses sound waves to create images of your heart. It provides your doctor with information about the size and shape of your heart and how well your hearts chambers and valves are working. This procedure takes approximately one hour. There are no restrictions for this procedure.    Follow-Up: Your provider recommends that you schedule a follow-up appointment in 3 months with Dr. Meda [name].     Signed, Ena Dawley, MD  02/07/2019 4:52 PM    [facility] [place], [place], [place]  [address] Phone: [phone]; Fax: [phone]

## 2019-02-08 ENCOUNTER — Other Ambulatory Visit: Payer: Self-pay

## 2019-02-08 NOTE — Patient Outreach (Signed)
[facility]) Care Management  02/08/2019   [name] [name] [birth year] [id number]      Outreach attempt # 1 to the patient for initial assessment.  HIPAA verified by the patient. He ws able to complete the initial assessment.  Social:  The patient lives in the home alone.  The patient is married and his wife lives on the  alzheimer's unit.  The patient has a caregiver that come to the home and helps him.  He states that he does his ADLS/IADLS independent /assist.  The patient no longer drives.  His family takes him to his appointments. The patient denies any pain but states that he has had three falls with no injuries.  He states that he can be unstable on his feet so he walks with a cane or walker. Discussed with the patient about fall precautions and he verbalized understanding  The medical equipment in the home consist of: a walker, cane, dentures, eyeglasses and scales.  Conditions: Per chart review and conversing with the patient his conditions include: HTN, Atrial fibrillation, CHF, CAD, COPD, Hypothyroidism, HLD and Iron deficiency anemia.  The patient was referred to me for CHF.  The patient states that he weighs daily and records the weights.  He weighs 180 lbs today.  He denies any shortness of breath and chest pain. He states that he has lower extremity edema and takes his furosemide. Discussed with the patient about the CHF action plan and he verbalized understanding. The patient was able to tell me some signs and symptoms of CHF.  He states that he does not really follow any diet but has never added salt to his food. Discussed with the patient about salt and how it affects his body.  He verbalized understanding.  Medications: The patient is on sixteen medications.  His daughter comes and fills a pill box for him but he know the medications that he is on.  He did not express any concern for paying for the medications.   Advanced Directives: The patient states that he  has a health care power of attorney and living will.  Current Medications:  Current Outpatient Medications  Medication Sig Dispense Refill  . aspirin 81 MG tablet Take 81 mg by mouth at bedtime.    Marland Kitchen atorvastatin (LIPITOR) 10 MG tablet TAKE 1 TABLET AT BEDTIME 90 tablet 3  . Calcium-Vitamin D (CALTRATE 600 PLUS-VIT D PO) Take 1 tablet by mouth at bedtime.     . docusate sodium (COLACE) 100 MG capsule Take 1 capsule (100 mg total) by mouth daily as needed for mild constipation. 30 capsule 6  . folic acid (FOLVITE) 1 MG tablet Take 1 mg by mouth daily.     . furosemide (LASIX) 40 MG tablet Take 2 tablets (80 mg total) by mouth every morning. 180 tablet 3  . guaiFENesin (MUCINEX) 600 MG 12 hr tablet Take 600-1,200 mg by mouth 2 (two) times daily as needed (cold/congestion.).    Marland Kitchen methotrexate (RHEUMATREX) 2.5 MG tablet Take 10 mg by mouth once a week. Caution:Chemotherapy. Protect from light.    . Multiple Vitamin (MULTIVITAMIN WITH MINERALS) TABS tablet Take 1 tablet by mouth daily.    . nitroGLYCERIN (NITROSTAT) 0.4 MG SL tablet Place 1 tablet (0.4 mg total) under the tongue every 5 (five) minutes as needed for chest pain. 25 tablet 0  . potassium chloride SA (K-DUR,KLOR-CON) 20 MEQ tablet Take 2 tablets (40 mEq total) by mouth every morning. 180 tablet 3  .  Probiotic Product (HEALTHY COLON PO) Take 1 capsule by mouth daily.    . tamsulosin (FLOMAX) 0.4 MG CAPS capsule Take 0.4 mg by mouth every evening.    . TOPROL XL 25 MG 24 hr tablet TAKE ONE-HALF TABLET (12.5 MG TOTAL) DAILY WITH OR IMMEDIATELY FOLLOWING A MEAL (DOSE CHANGE) 45 tablet 2  . warfarin (COUMADIN) 5 MG tablet Take 1 tablet daily except 1 1/2 Mon Wed Fri or TAKE AS DIRECTED BY ANTICOAGULATION CLINIC (Patient taking differently: Take 5-7.5 mg by mouth See admin instructions. Take 1.5 tablets (7.5 mg) by mouth on Mondays, Wednesdays, & Fridays. Take 1 tablet (5 mg) by mouth on Sundays, Tuesdays, Thursdays, & Saturdays.) 120 tablet 1   . metolazone (ZAROXOLYN) 2.5 MG tablet Take 1 tablet (2.5 mg total) by mouth as needed. As needed for weight gain >3 lbs or worsening swelling. Take extra potassium with Metolazone 36 tablet 3   No current facility-administered medications for this visit.     Functional Status:  In your present state of health, do you have any difficulty performing the following activities: 02/08/2019 10/19/2018  Hearing? N N  Vision? Y N  Comment Patient states that he is going soon but has not made an appointment.  He will call. -  Difficulty concentrating or making decisions? N N  Walking or climbing stairs? Y Y  Dressing or bathing? Y N  Doing errands, shopping? Y Y  Comment he does n ot drive daughters  drive him -  Conservation officer, nature and eating ? - N  Using the Toilet? - Y  In the past six months, have you accidently leaked urine? - Y  Do you have problems with loss of bowel control? - Y  Managing your Medications? - Y  Managing your Finances? - Y  Housekeeping or managing your Housekeeping? - Y  Some recent data might be hidden    Fall/Depression Screening: Fall Risk  02/08/2019 10/19/2018 12/18/2017  Falls in the past year? 1 1 Yes  Comment - - -  Number falls in past yr: 1 1 2  or more  Injury with Fall? 0 0 No  Risk for fall due to : Impaired balance/gait Impaired balance/gait -  Follow up Education provided;Falls prevention discussed Falls prevention discussed -   PHQ 2/9 Scores 02/08/2019 10/19/2018 12/18/2017 05/21/2017 05/18/2015  PHQ - 2 Score 0 0 0 0 0    Assessment: Patient will benefit from health coach outreach for disease management and support.   THN CM Care Plan Problem One     Most Recent Value  Care Plan for Problem One  Active  THN Long Term Goal   Pt will have an increase knowledge base on HF and recognize early symptoms to report to provider's office within the next 90 days.  THN Long Term Goal Start Date  02/08/19  Interventions for Problem One Long Term Goal  Encouraged  medication compliance Reviewed signs and symptoms of heart failure, discussed with patient chf action plan, encouraged patient to weigh daily,     Plan: [place] will provide ongoing education for patient on heart failure through phone calls and sending printed information to patient for further discussion.  RN Health Coach will send printed information on heart failure.  RN Health Coach will send initial barriers letter, assessment, and care plan to primary care physician. RN Health Coach will contact patient in the month of April and patient agrees to next outreach.   Lazaro Arms RN, BSN, [place]  [place] Direct Dial:  [phone]  Fax: [phone]

## 2019-02-09 LAB — PRO B NATRIURETIC PEPTIDE: NT-Pro BNP: 2119 pg/mL — ABNORMAL HIGH (ref 0–486)

## 2019-02-09 LAB — BASIC METABOLIC PANEL
BUN/Creatinine Ratio: 16 (ref 10–24)
BUN: 18 mg/dL (ref 10–36)
CO2: 28 mmol/L (ref 20–29)
Calcium: 9.3 mg/dL (ref 8.6–10.2)
Chloride: 93 mmol/L — ABNORMAL LOW (ref 96–106)
Creatinine, Ser: 1.11 mg/dL (ref 0.76–1.27)
GFR calc Af Amer: 67 mL/min/{1.73_m2} (ref >59)
GFR calc non Af Amer: 58 mL/min/{1.73_m2} — ABNORMAL LOW (ref >59)
Glucose: 92 mg/dL (ref 65–99)
Potassium: 3.7 mmol/L (ref 3.5–5.2)
Sodium: 135 mmol/L (ref 134–144)

## 2019-02-15 ENCOUNTER — Telehealth: Payer: Self-pay

## 2019-02-15 NOTE — Telephone Encounter (Signed)
Author offered to do awv virtually, but pt. Stated his computer is broken, "it's as old as I am". Pt preferred to wait until summer to set up awv. AWV rescheduled for 8/6.

## 2019-02-18 ENCOUNTER — Telehealth: Payer: Self-pay | Admitting: *Deleted

## 2019-02-18 ENCOUNTER — Encounter: Payer: Self-pay | Admitting: *Deleted

## 2019-02-18 DIAGNOSIS — Z006 Encounter for examination for normal comparison and control in clinical research program: Secondary | ICD-10-CM

## 2019-02-18 NOTE — Telephone Encounter (Signed)
error 

## 2019-02-18 NOTE — Research (Addendum)
Late entry:   Envisage 3 month visit, will use cardiology office visit for vitals and lab work 02/07/2019.   Spoke with patient, over phone for research visit. No complaints at this time.  BMP was completed at cardiology office, CBC not completed at this time.  VKA is followed by PCP office.   Will see patient in April for his 6 month visit.

## 2019-02-22 ENCOUNTER — Telehealth: Payer: Self-pay | Admitting: Hematology

## 2019-02-22 NOTE — Telephone Encounter (Signed)
Returned call to patient regarding rescheduling appointment. Per patient appointment moved from April to July. Message to provider.

## 2019-03-02 ENCOUNTER — Ambulatory Visit: Payer: Medicare Other

## 2019-03-04 ENCOUNTER — Other Ambulatory Visit: Payer: Self-pay | Admitting: *Deleted

## 2019-03-04 DIAGNOSIS — Z006 Encounter for examination for normal comparison and control in clinical research program: Secondary | ICD-10-CM

## 2019-03-04 NOTE — Progress Notes (Signed)
Orders for blood work in for research study  CBC and CMP

## 2019-03-07 ENCOUNTER — Telehealth: Payer: Self-pay | Admitting: General Practice

## 2019-03-07 ENCOUNTER — Ambulatory Visit: Payer: Medicare Other | Admitting: Hematology

## 2019-03-07 ENCOUNTER — Other Ambulatory Visit: Payer: Medicare Other

## 2019-03-07 NOTE — Telephone Encounter (Signed)
----- Message from Patton Salles, RN sent at 03/04/2019  7:57 AM EDT ----- Regarding: RE: labs Perfect thanks so much. I will call him and let him know that yall will draw the blood for research and do a phone visit. Stay well, and safe. Thanks again Joelene Millin :)  ----- Message ----- From: Warden Fillers, RN Sent: 02/28/2019   9:49 AM EDT To: Patton Salles, RN Subject: FW: labs                                       Hi Joelene Millin,  It is fine if patient has labs drawn here.  I will just add a PT/INR.  He will of course need to come into the office for that.   ----- Message ----- From: [name], NP Sent: 02/25/2019   4:06 PM EDT To: Warden Fillers, RN Subject: RE: labs                                       I am completely fine with that  ----- Message ----- From: Warden Fillers, RN Sent: 02/25/2019   1:52 PM EDT To: [name], NP Subject: RE: labs                                       So if [facility] puts in the orders for the labs you are OK with them being drawn at [facility]?  Correct?  Just checking to be sure. ----- Message ----- From: [name], NP Sent: 02/25/2019  12:17 PM EDT To: Warden Fillers, RN Subject: RE: labs                                       I am fine with him doing labs when he is here to get his INR checked next time   [facility]  ----- Message ----- From: Warden Fillers, RN Sent: 02/25/2019  11:20 AM EDT To: [name], NP Subject: FW: labs                                        ----- Message ----- From: Patton Salles, RN Sent: 02/25/2019  11:06 AM EDT To: Warden Fillers, RN Subject: labs                                           Good Morning,   Hey hope you are doing well, and staying safe. Mr Ketchum is in a research study with [place] at [facility]. It is time for his 6 month appt during the month of April. I was wondering if yall are doing coumadin checks in the office or drive bys :). I need a CMP and CBC and I could put  the order in but wanted to see if it would even be possible for yall to draw it for me. I can do the rest of his  appt by phone. If you could let me know if this is possible or not possible that would be great. I completely understand either way. Just trying to keep it at a one stop shop minimum for patient.  Thanks so much,  Stay safe and hope to hear from you soon.   Philemon Kingdom :) RN CV Research  Dr Sherren Mocha is the PI over the study Dr Meda Coffee is the primary cardiologist for patient.

## 2019-03-11 ENCOUNTER — Other Ambulatory Visit: Payer: Self-pay

## 2019-03-11 NOTE — Patient Outreach (Signed)
[facility]) Care Management  03/11/2019   [name] [name] [birth year] [id number]  Subjective: Successful call to the patient.  HIPAA verified The patient states that he is doing fine.  He denies any chest pain, shortness of breath, swelling or falls.  The patient states that he did have some swelling but his legs have gone down.  He states that his weight today is 180 lbs.  He is in the green zone today.  He states that his diet is good.  His daughter's prepare his meals and he has an aid that comes to warm them.  He is taking his medication as prescribed.  He has his medications delivered to him.  The patient has an appointment for an INR on 4/22 AND Annual wellness visit on 8/6.    Current Medications:  Current Outpatient Medications  Medication Sig Dispense Refill  . aspirin 81 MG tablet Take 81 mg by mouth at bedtime.    Marland Kitchen atorvastatin (LIPITOR) 10 MG tablet TAKE 1 TABLET AT BEDTIME 90 tablet 3  . Calcium-Vitamin D (CALTRATE 600 PLUS-VIT D PO) Take 1 tablet by mouth at bedtime.     . docusate sodium (COLACE) 100 MG capsule Take 1 capsule (100 mg total) by mouth daily as needed for mild constipation. 30 capsule 6  . folic acid (FOLVITE) 1 MG tablet Take 1 mg by mouth daily.     . furosemide (LASIX) 40 MG tablet Take 2 tablets (80 mg total) by mouth every morning. 180 tablet 3  . guaiFENesin (MUCINEX) 600 MG 12 hr tablet Take 600-1,200 mg by mouth 2 (two) times daily as needed (cold/congestion.).    Marland Kitchen methotrexate (RHEUMATREX) 2.5 MG tablet Take 10 mg by mouth once a week. Caution:Chemotherapy. Protect from light.    . Multiple Vitamin (MULTIVITAMIN WITH MINERALS) TABS tablet Take 1 tablet by mouth daily.    . nitroGLYCERIN (NITROSTAT) 0.4 MG SL tablet Place 1 tablet (0.4 mg total) under the tongue every 5 (five) minutes as needed for chest pain. 25 tablet 0  . potassium chloride SA (K-DUR,KLOR-CON) 20 MEQ tablet Take 2 tablets (40 mEq total) by mouth every morning. 180  tablet 3  . Probiotic Product (HEALTHY COLON PO) Take 1 capsule by mouth daily.    . tamsulosin (FLOMAX) 0.4 MG CAPS capsule Take 0.4 mg by mouth every evening.    . TOPROL XL 25 MG 24 hr tablet TAKE ONE-HALF TABLET (12.5 MG TOTAL) DAILY WITH OR IMMEDIATELY FOLLOWING A MEAL (DOSE CHANGE) 45 tablet 2  . warfarin (COUMADIN) 5 MG tablet Take 1 tablet daily except 1 1/2 Mon Wed Fri or TAKE AS DIRECTED BY ANTICOAGULATION CLINIC (Patient taking differently: Take 5-7.5 mg by mouth See admin instructions. Take 1.5 tablets (7.5 mg) by mouth on Mondays, Wednesdays, & Fridays. Take 1 tablet (5 mg) by mouth on Sundays, Tuesdays, Thursdays, & Saturdays.) 120 tablet 1  . metolazone (ZAROXOLYN) 2.5 MG tablet Take 1 tablet (2.5 mg total) by mouth as needed. As needed for weight gain >3 lbs or worsening swelling. Take extra potassium with Metolazone 36 tablet 3   No current facility-administered medications for this visit.     Functional Status:  In your present state of health, do you have any difficulty performing the following activities: 02/08/2019 10/19/2018  Hearing? N N  Vision? Y N  Comment Patient states that he is going soon but has not made an appointment.  He will call. -  Difficulty concentrating or making decisions? N N  Walking or climbing stairs? Y Y  Dressing or bathing? Y N  Doing errands, shopping? Y Y  Comment he does n ot drive daughters  drive him -  Conservation officer, nature and eating ? - N  Using the Toilet? - Y  In the past six months, have you accidently leaked urine? - Y  Do you have problems with loss of bowel control? - Y  Managing your Medications? - Y  Managing your Finances? - Y  Housekeeping or managing your Housekeeping? - Y  Some recent data might be hidden    Fall/Depression Screening: Fall Risk  03/11/2019 02/08/2019 10/19/2018  Falls in the past year? 0 1 1  Comment - - -  Number falls in past yr: - 1 1  Injury with Fall? - 0 0  Risk for fall due to : - Impaired  balance/gait Impaired balance/gait  Follow up - Education provided;Falls prevention discussed Falls prevention discussed   PHQ 2/9 Scores 02/08/2019 10/19/2018 12/18/2017 05/21/2017 05/18/2015  PHQ - 2 Score 0 0 0 0 0    Assessment: Patient will continue to benefit from health coach outreach for disease management and support. THN CM Care Plan Problem One     Most Recent Value  Interventions for Problem One Long Term Goal  Discussed the signs and symptoms, CHF action plan, Diet and encouraged medication adherence.       Plan: RN Health Coach will contact patient in the month of May and patient agrees to next outreach.   Lazaro Arms RN, BSN, [place] Direct Dial:  [phone]  Fax: [phone]

## 2019-03-16 ENCOUNTER — Encounter: Payer: Self-pay | Admitting: *Deleted

## 2019-03-16 ENCOUNTER — Ambulatory Visit (INDEPENDENT_AMBULATORY_CARE_PROVIDER_SITE_OTHER): Payer: Medicare Other | Admitting: General Practice

## 2019-03-16 ENCOUNTER — Other Ambulatory Visit (INDEPENDENT_AMBULATORY_CARE_PROVIDER_SITE_OTHER): Payer: Medicare Other

## 2019-03-16 ENCOUNTER — Other Ambulatory Visit: Payer: Medicare Other

## 2019-03-16 ENCOUNTER — Ambulatory Visit: Payer: Medicare Other

## 2019-03-16 ENCOUNTER — Other Ambulatory Visit: Payer: Self-pay | Admitting: Adult Health

## 2019-03-16 ENCOUNTER — Other Ambulatory Visit: Payer: Self-pay

## 2019-03-16 DIAGNOSIS — Z006 Encounter for examination for normal comparison and control in clinical research program: Secondary | ICD-10-CM | POA: Diagnosis not present

## 2019-03-16 DIAGNOSIS — I4891 Unspecified atrial fibrillation: Secondary | ICD-10-CM

## 2019-03-16 DIAGNOSIS — Z7901 Long term (current) use of anticoagulants: Secondary | ICD-10-CM

## 2019-03-16 LAB — POCT INR: INR: 2.6 (ref 2.0–3.0)

## 2019-03-16 NOTE — Patient Instructions (Signed)
Pre visit review using our clinic review tool, if applicable. No additional management support is needed unless otherwise documented below in the visit note.  Continue to take 1 (5 mg) tablet daily except 1 1/2 tablets (7.5 mg) on Monday/Wed/Fridays.  Re-check in 6 weeks   Daughter with patient today at visit.

## 2019-03-17 LAB — CBC WITH DIFFERENTIAL/PLATELET
Basophils Absolute: 0 10*3/uL (ref 0.0–0.2)
Basos: 1 %
EOS (ABSOLUTE): 0.2 10*3/uL (ref 0.0–0.4)
Eos: 2 %
Hematocrit: 38.3 % (ref 37.5–51.0)
Hemoglobin: 12.9 g/dL — ABNORMAL LOW (ref 13.0–17.7)
Immature Grans (Abs): 0 10*3/uL (ref 0.0–0.1)
Immature Granulocytes: 0 %
Lymphocytes Absolute: 1 10*3/uL (ref 0.7–3.1)
Lymphs: 14 %
MCH: 33.6 pg — ABNORMAL HIGH (ref 26.6–33.0)
MCHC: 33.7 g/dL (ref 31.5–35.7)
MCV: 100 fL — ABNORMAL HIGH (ref 79–97)
Monocytes Absolute: 1 10*3/uL — ABNORMAL HIGH (ref 0.1–0.9)
Monocytes: 13 %
Neutrophils Absolute: 5 10*3/uL (ref 1.4–7.0)
Neutrophils: 70 %
Platelets: 196 10*3/uL (ref 150–450)
RBC: 3.84 x10E6/uL — ABNORMAL LOW (ref 4.14–5.80)
RDW: 12.6 % (ref 11.6–15.4)
WBC: 7.2 10*3/uL (ref 3.4–10.8)

## 2019-03-17 LAB — COMPREHENSIVE METABOLIC PANEL
ALT: 15 IU/L (ref 0–44)
AST: 26 IU/L (ref 0–40)
Albumin/Globulin Ratio: 1.6 (ref 1.2–2.2)
Albumin: 3.9 g/dL (ref 3.5–4.6)
Alkaline Phosphatase: 59 IU/L (ref 39–117)
BUN/Creatinine Ratio: 18 (ref 10–24)
BUN: 19 mg/dL (ref 10–36)
Bilirubin Total: 0.5 mg/dL (ref 0.0–1.2)
CO2: 29 mmol/L (ref 20–29)
Calcium: 9.3 mg/dL (ref 8.6–10.2)
Chloride: 96 mmol/L (ref 96–106)
Creatinine, Ser: 1.04 mg/dL (ref 0.76–1.27)
GFR calc Af Amer: 72 mL/min/{1.73_m2} (ref >59)
GFR calc non Af Amer: 62 mL/min/{1.73_m2} (ref >59)
Globulin, Total: 2.5 g/dL (ref 1.5–4.5)
Glucose: 76 mg/dL (ref 65–99)
Potassium: 3.8 mmol/L (ref 3.5–5.2)
Sodium: 138 mmol/L (ref 134–144)
Total Protein: 6.4 g/dL (ref 6.0–8.5)

## 2019-04-12 ENCOUNTER — Ambulatory Visit: Payer: Medicare Other

## 2019-04-12 ENCOUNTER — Other Ambulatory Visit: Payer: Self-pay

## 2019-04-12 NOTE — Patient Outreach (Addendum)
[facility]) Care Management  04/12/2019   [name] [name] 09-Mar-1925 [id number]  Subjective: Successful outreach to the patient.  Two patient identifier obtained.  The patient states that he is doing well.  He denies any shortness of breath, chest pain or swelling.  He states that the swelling he had in his legs is going down.  He states that he did not weigh today due to he woke up late.  He states that he was getting ready to eat.  He has someone come to the home and clean for him and he has meals prepared for him.  His children prepare his medication for him.  He does not have to leave the home for his meds they are delivered.  If he does leave the home it is to go to a doctor appointment. He will wear a mask and follow preventative measures.  Current Medications:  Current Outpatient Medications  Medication Sig Dispense Refill  . aspirin 81 MG tablet Take 81 mg by mouth at bedtime.    Marland Kitchen atorvastatin (LIPITOR) 10 MG tablet TAKE 1 TABLET AT BEDTIME 90 tablet 3  . Calcium-Vitamin D (CALTRATE 600 PLUS-VIT D PO) Take 1 tablet by mouth at bedtime.     . docusate sodium (COLACE) 100 MG capsule Take 1 capsule (100 mg total) by mouth daily as needed for mild constipation. 30 capsule 6  . folic acid (FOLVITE) 1 MG tablet Take 1 mg by mouth daily.     . furosemide (LASIX) 40 MG tablet Take 2 tablets (80 mg total) by mouth every morning. 180 tablet 3  . guaiFENesin (MUCINEX) 600 MG 12 hr tablet Take 600-1,200 mg by mouth 2 (two) times daily as needed (cold/congestion.).    Marland Kitchen methotrexate (RHEUMATREX) 2.5 MG tablet Take 10 mg by mouth once a week. Caution:Chemotherapy. Protect from light.    . Multiple Vitamin (MULTIVITAMIN WITH MINERALS) TABS tablet Take 1 tablet by mouth daily.    . nitroGLYCERIN (NITROSTAT) 0.4 MG SL tablet Place 1 tablet (0.4 mg total) under the tongue every 5 (five) minutes as needed for chest pain. 25 tablet 0  . potassium chloride SA (K-DUR,KLOR-CON) 20 MEQ  tablet Take 2 tablets (40 mEq total) by mouth every morning. 180 tablet 3  . Probiotic Product (HEALTHY COLON PO) Take 1 capsule by mouth daily.    . tamsulosin (FLOMAX) 0.4 MG CAPS capsule Take 0.4 mg by mouth every evening.    . TOPROL XL 25 MG 24 hr tablet TAKE ONE-HALF TABLET (12.5 MG TOTAL) DAILY WITH OR IMMEDIATELY FOLLOWING A MEAL (DOSE CHANGE) 45 tablet 2  . warfarin (COUMADIN) 5 MG tablet Take 1 tablet daily except 1 1/2 Mon Wed Fri or TAKE AS DIRECTED BY ANTICOAGULATION CLINIC (Patient taking differently: Take 5-7.5 mg by mouth See admin instructions. Take 1.5 tablets (7.5 mg) by mouth on Mondays, Wednesdays, & Fridays. Take 1 tablet (5 mg) by mouth on Sundays, Tuesdays, Thursdays, & Saturdays.) 120 tablet 1  . metolazone (ZAROXOLYN) 2.5 MG tablet Take 1 tablet (2.5 mg total) by mouth as needed. As needed for weight gain >3 lbs or worsening swelling. Take extra potassium with Metolazone 36 tablet 3   No current facility-administered medications for this visit.     Functional Status:  In your present state of health, do you have any difficulty performing the following activities: 02/08/2019 10/19/2018  Hearing? N N  Vision? Y N  Comment Patient states that he is going soon but has not made an  appointment.  He will call. -  Difficulty concentrating or making decisions? N N  Walking or climbing stairs? Y Y  Dressing or bathing? Y N  Doing errands, shopping? Y Y  Comment he does n ot drive daughters  drive him -  Conservation officer, nature and eating ? - N  Using the Toilet? - Y  In the past six months, have you accidently leaked urine? - Y  Do you have problems with loss of bowel control? - Y  Managing your Medications? - Y  Managing your Finances? - Y  Housekeeping or managing your Housekeeping? - Y  Some recent data might be hidden    Fall/Depression Screening: Fall Risk  04/12/2019 03/11/2019 02/08/2019  Falls in the past year? 0 0 1  Comment - - -  Number falls in past yr: - - 1   Injury with Fall? - - 0  Risk for fall due to : - - Impaired balance/gait  Follow up - - Education provided;Falls prevention discussed   PHQ 2/9 Scores 02/08/2019 10/19/2018 12/18/2017 05/21/2017 05/18/2015  PHQ - 2 Score 0 0 0 0 0    Assessment: Patient will continue to benefit from health coach outreach for disease management and support. THN CM Care Plan Problem One     Most Recent Value  THN Long Term Goal   Patient will verbalizeno hospital admission for CHF exacerbation in the next 60 days  THN Long Term Goal Start Date  04/12/19  Interventions for Problem One Long Term Goal  Reviwed signs and symptoms of CHF , reviewed CHF action plan. encouraged medication adherence,discused signs and symptoms of covid -19 and precautionary measures     Plan: RN Health Coach will contact patient in the month of July  and patient agrees to next outreach.   Lazaro Arms RN, BSN, [place] Direct Dial:  [phone]  Fax: [phone]

## 2019-04-16 ENCOUNTER — Other Ambulatory Visit: Payer: Self-pay | Admitting: Cardiology

## 2019-04-22 ENCOUNTER — Other Ambulatory Visit: Payer: Self-pay

## 2019-04-22 ENCOUNTER — Ambulatory Visit (INDEPENDENT_AMBULATORY_CARE_PROVIDER_SITE_OTHER): Payer: Medicare Other | Admitting: Podiatry

## 2019-04-22 ENCOUNTER — Encounter: Payer: Self-pay | Admitting: Podiatry

## 2019-04-22 VITALS — Temp 97.9°F

## 2019-04-22 DIAGNOSIS — D689 Coagulation defect, unspecified: Secondary | ICD-10-CM | POA: Diagnosis not present

## 2019-04-22 DIAGNOSIS — M79676 Pain in unspecified toe(s): Secondary | ICD-10-CM | POA: Diagnosis not present

## 2019-04-22 DIAGNOSIS — B351 Tinea unguium: Secondary | ICD-10-CM

## 2019-04-22 NOTE — Progress Notes (Signed)
Patient ID: [name] [name], male   DOB: 03/03/1925, 85 y.o.   MRN: [id number] HPI  Complaint:  Visit Type: Patient returns to my office for continued preventative foot care services. Complaint: Patient states" my nails have grown long and thick and become painful to walk and wear shoes" . He presents for preventative foot care services. No changes to ROS.  Patient has not been seen for over 1 year.  Podiatric Exam: Vascular: dorsalis pedis and posterior tibial pulses are negative. Capillary return is immediate. Temperature gradient is negative. Skin turgor WNL,   Sensorium: Normal Semmes Weinstein monofilament test. Normal tactile sensation bilaterally.  Nail Exam: Pt has thick disfigured discolored nails with subungual debris noted bilateral entire nail hallux through fifth toenails Ulcer Exam: There is no evidence of ulcer or pre-ulcerative changes or infection. Orthopedic Exam: Muscle tone and strength are WNL. No limitations in general ROM. No crepitus or effusions noted. Foot type and digits show no abnormalities. Bony prominences are unremarkable. Amputation third toe right foot. Skin: No Porokeratosis. No infection or ulcers.   Diagnosis:  Tinea unguium, Pain in right toe, pain in left toes,   Treatment & Plan Procedures and Treatment: Consent by patient was obtained for treatment procedures. The patient understood the discussion of treatment and procedures well. All questions were answered thoroughly reviewed. Debridement of mycotic and hypertrophic toenails, 1 through 5 bilateral and clearing of subungual debris. No ulceration, no infection noted.  Return Visit-Office Procedure: Patient instructed to return to the office for a follow up visit 10 weeks for continued evaluation and treatment.  Gardiner Barefoot DPM

## 2019-04-23 ENCOUNTER — Ambulatory Visit: Payer: Medicare Other | Admitting: Podiatry

## 2019-04-27 ENCOUNTER — Ambulatory Visit: Payer: Medicare Other

## 2019-04-29 ENCOUNTER — Ambulatory Visit: Payer: Self-pay

## 2019-04-29 ENCOUNTER — Ambulatory Visit: Payer: Self-pay | Admitting: Adult Health

## 2019-04-29 NOTE — Telephone Encounter (Signed)
Pt.'s daughter ,[name], reports the lady who come into the pt.'s home to assist him, her daughter has tested positive for COVID 19 this week. The pt. Currently does not have any symptoms. Lives alone. Daughters are in and out to help him. Would like advise from pt.'s PCP on what to do. [place] [phone] or [phone].  Answer Assessment - Initial Assessment Questions 1. CLOSE CONTACT: "Who is the person with the confirmed or suspected COVID-19 infection that you were exposed to?"     Caregiver daughter 2. PLACE of CONTACT: "Where were you when you were exposed to COVID-19?" (e.g., home, school, medical waiting room; which city?)     Home 3. TYPE of CONTACT: "How much contact was there?" (e.g., sitting next to, live in same house, work in same office, same building)     Comes into the home 4. DURATION of CONTACT: "How long were you in contact with the COVID-19 patient?" (e.g., a few seconds, passed by person, a few minutes, live with the patient)     Caregiver 5. DATE of CONTACT: "When did you have contact with a COVID-19 patient?" (e.g., how many days ago)     Wed. 6. TRAVEL: "Have you traveled out of the country recently?" If so, "When and where?"     * Also ask about out-of-state travel, since the CDC has identified some high-risk cities for community spread in the [place].     * Note: Travel becomes less relevant if there is widespread community transmission where the patient lives.     No 7. COMMUNITY SPREAD: "Are there lots of cases of COVID-19 (community spread) where you live?" (See public health department website, if unsure)       Yes 8. SYMPTOMS: "Do you have any symptoms?" (e.g., fever, cough, breathing difficulty)     No 9. PREGNANCY OR POSTPARTUM: "Is there any chance you are pregnant?" "When was your last menstrual period?" "Did you deliver in the last 2 weeks?"     n/a 10. HIGH RISK: "Do you have any heart or lung problems? Do you have a weak immune system?" (e.g.,  CHF, COPD, asthma, HIV positive, chemotherapy, renal failure, diabetes mellitus, sickle cell anemia)       Heart disease  Protocols used: CORONAVIRUS (COVID-19) EXPOSURE-A-AH

## 2019-04-29 NOTE — Telephone Encounter (Signed)
I spoke with patient's daugter and informed her Covid 19 symptoms to watch out for as her dad has had someone in his house who is the mother of someone who has been exposed to Covid 19.  I  explained precautions of Covid 19 guidelines such as social distancing, hand washing, wearing mask while around patient.  Mrs. Owens Shark, patient's daughter, just wanted to know what to look out for as far as her father is concerned.  I offered her a Virtual visit for her dad today but she didn't think that would be necessary for now.  I informed her to keep an eye on patient and to call if any Covid symptoms arise in patient.

## 2019-04-29 NOTE — Telephone Encounter (Signed)
Pt daughter called stating that her fathers house keeper's daughter has tested positive for COVID-19.  He has had no direct exposure to the daughter but the house keeper is in his home once per week for about 1 hour.  There is another daughter of the housekeeper who comes in to help heat his meals. Ms Owens Shark states that her dads last exposure to his house keeper was Tuesday and Tuesday night her daughter got sick.  Mr Sidman has no symptoms. Care advice read to daughter. Daughter verbalized understanding. Call transferred to office for further evaluation.  Reason for Disposition . [1] COVID-19 EXPOSURE (Close Contact) AND [2] within last 14 days BUT [3] NO symptoms  Answer Assessment - Initial Assessment Questions 1. CLOSE CONTACT: "Who is the person with the confirmed or suspected COVID-19 infection that you were exposed to?"     Daughter of house keeper 2. PLACE of CONTACT: "Where were you when you were exposed to COVID-19?" (e.g., home, school, medical waiting room; which city?)     Once per week last there Tuesday 3. TYPE of CONTACT: "How much contact was there?" (e.g., sitting next to, live in same house, work in same office, same building)    usuall in home 71minutes to 1 hour 4. DURATION of CONTACT: "How long were you in contact with the COVID-19 patient?" (e.g., a few seconds, passed by person, a few minutes, live with the patient)    Just passes by 5. DATE of CONTACT: "When did you have contact with a COVID-19 patient?" (e.g., how many days ago)     Tuesday 6. TRAVEL: "Have you traveled out of the country recently?" If so, "When and where?"     * Also ask about out-of-state travel, since the CDC has identified some high-risk cities for community spread in the [place].     * Note: Travel becomes less relevant if there is widespread community transmission where the patient lives.   No 7. COMMUNITY SPREAD: "Are there lots of cases of COVID-19 (community spread) where you live?" (See public health  department website, if unsure)       [place] 8. SYMPTOMS: "Do you have any symptoms?" (e.g., fever, cough, breathing difficulty)     No symptoms 9. PREGNANCY OR POSTPARTUM: "Is there any chance you are pregnant?" "When was your last menstrual period?" "Did you deliver in the last 2 weeks?"     N/A 10. HIGH RISK: "Do you have any heart or lung problems? Do you have a weak immune system?" (e.g., CHF, COPD, asthma, HIV positive, chemotherapy, renal failure, diabetes mellitus, sickle cell anemia)       A fib heart valve replacement surgery  Protocols used: CORONAVIRUS (COVID-19) EXPOSURE-A-AH

## 2019-05-04 ENCOUNTER — Other Ambulatory Visit: Payer: Self-pay

## 2019-05-04 ENCOUNTER — Ambulatory Visit (INDEPENDENT_AMBULATORY_CARE_PROVIDER_SITE_OTHER): Payer: Medicare Other | Admitting: General Practice

## 2019-05-04 DIAGNOSIS — Z7901 Long term (current) use of anticoagulants: Secondary | ICD-10-CM

## 2019-05-04 DIAGNOSIS — I4891 Unspecified atrial fibrillation: Secondary | ICD-10-CM

## 2019-05-04 LAB — POCT INR: INR: 1.4 — AB (ref 2.0–3.0)

## 2019-05-04 NOTE — Progress Notes (Signed)
I have reviewed the results and agree with this plan   

## 2019-05-04 NOTE — Patient Instructions (Signed)
Pre visit review using our clinic review tool, if applicable. No additional management support is needed unless otherwise documented below in the visit note.  Take 2 tablets today (6/10) and take 1 1/2 tablets tomorrow (6/11).  On Friday continue to take 1 (5 mg) tablet daily except 1 1/2 tablets (7.5 mg) on Monday/Wed/Fridays.  Re-check in 2 weeks   Call daughter, [name] for any scheduling issues - [phone].

## 2019-05-07 ENCOUNTER — Other Ambulatory Visit: Payer: Self-pay | Admitting: Nurse Practitioner

## 2019-05-11 ENCOUNTER — Ambulatory Visit (INDEPENDENT_AMBULATORY_CARE_PROVIDER_SITE_OTHER): Payer: Medicare Other | Admitting: General Practice

## 2019-05-11 ENCOUNTER — Other Ambulatory Visit: Payer: Self-pay

## 2019-05-11 ENCOUNTER — Other Ambulatory Visit: Payer: Self-pay | Admitting: Cardiology

## 2019-05-11 DIAGNOSIS — Z7901 Long term (current) use of anticoagulants: Secondary | ICD-10-CM | POA: Diagnosis not present

## 2019-05-11 DIAGNOSIS — I4891 Unspecified atrial fibrillation: Secondary | ICD-10-CM

## 2019-05-11 LAB — POCT INR: INR: 3.2 — AB (ref 2.0–3.0)

## 2019-05-11 MED ORDER — TAMSULOSIN HCL 0.4 MG PO CAPS: 0.4000 mg | ORAL_CAPSULE | Freq: Every evening | ORAL | 3 refills | Status: DC

## 2019-05-11 NOTE — Patient Instructions (Signed)
Pre visit review using our clinic review tool, if applicable. No additional management support is needed unless otherwise documented below in the visit note.  Hold coumadin today and then continue to take 1 (5 mg) tablet daily except 1 1/2 tablets (7.5 mg) on Monday/Wed/Fridays.  Re-check in 4 weeks  Dosing details left of [name]'s VM.   Call [name] for any scheduling issues - [phone].

## 2019-05-12 ENCOUNTER — Telehealth (HOSPITAL_COMMUNITY): Payer: Self-pay

## 2019-05-12 NOTE — Telephone Encounter (Signed)

## 2019-05-13 ENCOUNTER — Other Ambulatory Visit: Payer: Self-pay

## 2019-05-13 ENCOUNTER — Ambulatory Visit (HOSPITAL_COMMUNITY): Payer: Medicare Other | Attending: Cardiology

## 2019-05-13 DIAGNOSIS — I350 Nonrheumatic aortic (valve) stenosis: Secondary | ICD-10-CM | POA: Diagnosis not present

## 2019-05-13 DIAGNOSIS — I428 Other cardiomyopathies: Secondary | ICD-10-CM | POA: Diagnosis not present

## 2019-05-18 ENCOUNTER — Ambulatory Visit: Payer: Medicare Other

## 2019-05-27 ENCOUNTER — Other Ambulatory Visit: Payer: Self-pay | Admitting: Cardiology

## 2019-06-01 ENCOUNTER — Telehealth: Payer: Self-pay | Admitting: *Deleted

## 2019-06-01 NOTE — Telephone Encounter (Signed)
LMOM FOR A RETURN CALL TO THE OFFICE TO DO COVID PRESCREEN QUESTIONS.   Marland Kitchen    COVID-19 Pre-Screening Questions:  . In the past 7 to 10 days have you had a cough,  shortness of breath, headache, congestion, fever (100 or greater) body aches, chills, sore throat, or sudden loss of taste or sense of smell? . Have you been around anyone with known Covid 19. . Have you been around anyone who is awaiting Covid 19 test results in the past 7 to 10 days? . Have you been around anyone who has been exposed to Covid 19, or has mentioned symptoms of Covid 19 within the past 7 to 10 days?  If you have any concerns/questions about symptoms patients report during screening (either on the phone or at threshold). Contact the provider seeing the patient or DOD for further guidance.  If neither are available contact a member of the leadership team.

## 2019-06-03 NOTE — Telephone Encounter (Signed)
    COVID-19 Pre-Screening Questions:  . In the past 7 to 10 days have you had a cough,  shortness of breath, headache, congestion, fever (100 or greater) body aches, chills, sore throat, or sudden loss of taste or sense of smell?-NO . Have you been around anyone with known Covid 19.-NO . Have you been around anyone who is awaiting Covid 19 test results in the past 7 to 10 days?-NO . Have you been around anyone who has been exposed to Covid 19, or has mentioned symptoms of Covid 19 within the past 7 to 10 days?-NO   COVID PRE-SCREEN QUESTIONS PERFORMED ON BOTH THE PT AND SON-IN-LAW, AND BOTH ANSWERED NO TO ALL QUESTIONS.  BOTH ARE AWARE TO WEAR THEIR FACIAL MASK THE ENTIRE DURATION OF THE VISIT.  NOTE PLACED IN APPT NOTES THAT SON-IN-LAW MUST ACCOMMODATE THE PT DUE TO PT BEING IN W/C AND HAVING DIFFICULTY AMBULATING WITHOUT ASSISTANCE.

## 2019-06-06 ENCOUNTER — Ambulatory Visit (INDEPENDENT_AMBULATORY_CARE_PROVIDER_SITE_OTHER): Payer: Medicare Other | Admitting: Cardiology

## 2019-06-06 ENCOUNTER — Other Ambulatory Visit: Payer: Self-pay

## 2019-06-06 ENCOUNTER — Encounter: Payer: Self-pay | Admitting: Cardiology

## 2019-06-06 VITALS — BP 144/72 | HR 77 | Ht 71.5 in | Wt 193.4 lb

## 2019-06-06 DIAGNOSIS — I2510 Atherosclerotic heart disease of native coronary artery without angina pectoris: Secondary | ICD-10-CM | POA: Diagnosis not present

## 2019-06-06 DIAGNOSIS — Z952 Presence of prosthetic heart valve: Secondary | ICD-10-CM | POA: Diagnosis not present

## 2019-06-06 MED ORDER — NITROGLYCERIN 0.4 MG SL SUBL: 0.4000 mg | SUBLINGUAL_TABLET | SUBLINGUAL | 0 refills | Status: DC | PRN

## 2019-06-06 MED ORDER — METOLAZONE 2.5 MG PO TABS: 2.5000 mg | ORAL_TABLET | ORAL | 3 refills | Status: DC

## 2019-06-06 NOTE — Progress Notes (Signed)
[facility]    Cardiology Office Note    Date:  06/06/2019   ID:  [name], DOB 02-Oct-1925, MRN [id number] PCP:  [name] Peng, NP  Cardiologist:  Dr. Meda [name] / Dr. Burt [name] & Dr. Roxy [name] (TAVR)   CC: 4 months follow-up  History of Present Illness:  [name] [name] is a 85 y.o. [name] with a history of CAD, chronic diastolic CHF, longstanding persistant atrial fibrillation on coumadin, carotid artery disease with CTO RICA, history of TIA, HTN, HLD, hypothyroidism, urinary incontinence and severe AS s/p TAVR (09/14/18) who presents to clinic for follow up.      Echocardiograms have demonstrated the presence of low normal left ventricular systolic function with aortic stenosis that has gradually progressed in severity. Echocardiogram performed 03/02/2018 revealed severe aortic stenosis. Peak velocity across aortic valve measured 4.4 m/s corresponding to mean transvalvular gradient estimated 43 mmHg. The DVI was reportedly only 0.18. Left ventricular systolic function remained stable ~50 to 55%. In 03/2018 he was evaluated in the emergency department with a brief episode of chest pain relieved by nitroglycerin. Troponins were negative. He was seen in follow-up by [name] [name] in July and subsequently the patient was referred to the multidisciplinary heart valve clinic for evaluation of his aortic stenosis. L/RHC 07/21/18 revealed coronary artery disease with 70% proximal stenosis of the left anterior descending coronary artery and otherwise moderate nonobstructive disease. Right heart pressures and cardiac output were normal. Medical therapy was recommended.    He ultimately underwent successful TAVR with a 29 mm Edwards Sapien 3 THV via the TF approach on 09/14/18. Post operative echoshowed EF 55%, normally functioning TAVR with mean gradient of 8 mm Hg and no PVL. ECG with afib with new LBBB but no high grade heart block. He was enrolled in the [employer] trial and randomized  back to Coumadin. He was discharged on Aspirin and resumed on home coumadin. On the day of discharge he had a worsening cough and wheezing. CXR showed pleural effusions and possible bronchitis. He was treated with IV lasix and oral doxycycline. He was discharged on lasix 4m BID. [facility]    Was seen in the structural heart/TAVR clinic in November and was doing well.  Echocardiogram performed that they showed normal transvalvular gradients however significant drop in LVEF from 55 to 60% to 25 to 30%.  02/07/2019 -the patient is coming after 4 months, he has been doing well, he is now back living at home.  He denies any chest pain or dyspnea other than on moderate exertion, he walks with a walker, he has not had any falls since TAVR (of note 3 shortly prior to TAVR), he also states that despite having significant lower extremity edema these has been progressively improving ever since his procedure.  He has been compliant with his medications and has no blood in his urine or stool very occasional nosebleeds unchanged from before the procedure.    06/06/2019 - 4 months follow up, patient states he has been doing well he has chronic lower extremity edema that he considers improved.  He sleeps in bed on one pillow on his side and has no orthopnea or proximal nocturnal dyspnea.  He does not take all of his diuretics as going to the bathroom frequently is inconvenient for him.  He has hard time getting off the chair, he continues to live alone family support.  No palpitation dizziness no syncope.  No chest pain.   Past Medical  History:  Diagnosis Date  . Atrial fibrillation, chronic    a. on coumadin   . Carotid artery occlusion    right total internal artery occlusion  . Chronic diastolic CHF (congestive heart failure) ([place])   . Coronary artery disease    s/p cardiac cath in 1990s, and cardiolite study  in 2005 showing EF 54%  . Degenerative disc disease    cervical spine   . GERD (gastroesophageal reflux disease)   . HIV infection ([place])   . Hyperlipidemia   . Hypertension   . Hypothyroidism   . Rheumatoid arthritis ([place])   . S/P TAVR (transcatheter aortic valve replacement)    Edwards Sapien 3 THV (size 29 mm) via the R TF approach   . Skin cancer    forehead  . Stroke [facility])    TIA   . TIA (transient ischemic attack)   . Urgency incontinence     Past Surgical History:  Procedure Laterality Date  . AMPUTATION Right 02/21/2015   Procedure: Right 3rd Ray Amputation;  Surgeon: [name] Minion, MD;  Location: [place];  Service: Orthopedics;  Laterality: Right;  Digital block and ankle block with MAC  . CARDIAC CATHETERIZATION  1990s  . CATARACT EXTRACTION W/ INTRAOCULAR LENS  IMPLANT, BILATERAL    . EYE SURGERY     cataracts bilateral  . INTRAOPERATIVE TRANSTHORACIC ECHOCARDIOGRAM  09/14/2018   Procedure: INTRAOPERATIVE TRANSTHORACIC ECHOCARDIOGRAM;  Surgeon: [name] Mocha, MD;  Location: [place];  Service: Open Heart Surgery;;  . RIGHT/LEFT HEART CATH AND CORONARY ANGIOGRAPHY N/A 07/21/2018   Procedure: RIGHT/LEFT HEART CATH AND CORONARY ANGIOGRAPHY;  Surgeon: [name] Mocha, MD;  Location: [place] [facility];  Service: Cardiovascular;  Laterality: N/A;  . SKIN CANCER EXCISION  12/2011   forehead  . TRANSCATHETER AORTIC VALVE REPLACEMENT, TRANSFEMORAL N/A 09/14/2018   Procedure: TRANSCATHETER AORTIC VALVE REPLACEMENT, TRANSFEMORAL. 53m EDWARDS SAPIEN3 THV.;  Surgeon: CSherren Mocha MD;  [place]  Service: Open Heart Surgery;  Laterality: N/A;   Current Medications: Outpatient Medications Prior to Visit  Medication Sig Dispense Refill  . aspirin 81 MG tablet Take 81 mg by mouth at bedtime.    .Marland Kitchenatorvastatin (LIPITOR) 10 MG tablet TAKE 1 TABLET AT BEDTIME 90 tablet 2  . Calcium-Vitamin D (CALTRATE 600 PLUS-VIT D PO) Take 1 tablet by mouth at bedtime.     . docusate sodium (COLACE) 100 MG capsule Take 1 capsule (100 mg total) by mouth daily as  needed for mild constipation. 30 capsule 6  . folic acid (FOLVITE) 1 MG tablet Take 1 mg by mouth daily.     . furosemide (LASIX) 40 MG tablet Take 2 tablets (80 mg total) by mouth every morning. 180 tablet 3  . guaiFENesin (MUCINEX) 600 MG 12 hr tablet Take 600-1,200 mg by mouth 2 (two) times daily as needed (cold/congestion.).    .Marland Kitchenmethotrexate (RHEUMATREX) 2.5 MG tablet Take 10 mg by mouth once a week. Caution:Chemotherapy. Protect from light.    . Multiple Vitamin (MULTIVITAMIN WITH MINERALS) TABS tablet Take 1 tablet by mouth daily.    . potassium chloride SA (K-DUR,KLOR-CON) 20 MEQ tablet Take 2 tablets (40 mEq total) by mouth every morning. 180 tablet 3  . Probiotic Product (HEALTHY COLON PO) Take 1 capsule by mouth daily.    . tamsulosin (FLOMAX) 0.4 MG CAPS capsule Take 1 capsule (0.4 mg total) by mouth every evening. 90 capsule 3  . TOPROL XL 25 MG 24 hr tablet TAKE ONE-HALF TABLET (12.5 MG  TOTAL) DAILY WITH OR IMMEDIATELY FOLLOWING A MEAL (DOSE CHANGE) 45 tablet 2  . warfarin (COUMADIN) 5 MG tablet Take 1 tablet daily except 1 1/2 Mon Wed Fri or TAKE AS DIRECTED BY ANTICOAGULATION CLINIC 120 tablet 1  . metolazone (ZAROXOLYN) 2.5 MG tablet Take 1 tablet (2.5 mg total) by mouth as needed. As needed for weight gain >3 lbs or worsening swelling. Take extra potassium with Metolazone 36 tablet 3  . nitroGLYCERIN (NITROSTAT) 0.4 MG SL tablet Place 1 tablet (0.4 mg total) under the tongue every 5 (five) minutes as needed for chest pain. 25 tablet 0   No facility-administered medications prior to visit.      Allergies:   Levofloxacin and Klor-con [potassium chloride er]   Social History   Socioeconomic History  . Marital status: Married    Spouse name: Not on file  . Number of children: Not on file  . Years of education: Not on file  . Highest education level: Not on file  Occupational History  . Not on file  Social Needs  . Financial resource strain: Not on file  . Food insecurity     Worry: Not on file    Inability: Not on file  . Transportation needs    Medical: Not on file    Non-medical: Not on file  Tobacco Use  . Smoking status: Former Smoker    Packs/day: 1.00    Years: 40.00    Pack years: 40.00    Types: Cigarettes    Quit date: 09/12/1989    Years since quitting: 29.7  . Smokeless tobacco: Never Used  . Tobacco comment: quit around 1992  Substance and Sexual Activity  . Alcohol use: No    Alcohol/week: 0.0 standard drinks    Comment: former, quit drinking in 1992  . Drug use: No  . Sexual activity: Not Currently  Lifestyle  . Physical activity    Days per week: Not on file    Minutes per session: Not on file  . Stress: Not on file  Relationships  . Social Herbalist on phone: Not on file    Gets together: Not on file    Attends religious service: Not on file    Active member of club or organization: Not on file    Attends meetings of clubs or organizations: Not on file    Relationship status: Not on file  Other Topics Concern  . 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     ROS:   Please see the history of present illness.    ROS All other systems reviewed and are negative.  PHYSICAL EXAM:   VS:  BP (!) 144/72   Pulse 77   Ht 5' 11.5" (1.816 m)   Wt 193 lb 6.4 oz (87.7 kg)   SpO2 98%   BMI 26.60 kg/m    GEN: Well nourished, well developed, in no acute distress HEENT: normal Neck: no JVD or masses Cardiac: irreg irreg; soft murmur. no rubs, or gallops, 1+ bilateral LE edema with chronic venous stasis changes.  Respiratory:  clear to auscultation  bilaterally, normal work of breathing GI: soft, nontender, nondistended, + BS MS: no deformity or atrophy Skin: warm and dry, no rash Neuro:  Alert and Oriented x 3, Strength and sensation are intact Psych: euthymic mood, full affect  Wt Readings from Last 3 Encounters:  06/06/19 193 lb 6.4 oz (87.7 kg)  02/07/19 189 lb 9.6 oz (86 kg)  11/04/18 177 lb 11.2 oz (80.6 kg)    Studies/Labs Reviewed:   EKG:  EKG is ordered today.  The ekg ordered today demonstrates aifb with PVCs, HR 67, LBBB  Recent Labs: 07/14/2018: TSH 5.28 09/10/2018: B Natriuretic Peptide 256.7 09/15/2018: Magnesium 1.8 02/07/2019: NT-Pro BNP 2,119 03/16/2019: ALT 15; BUN 19; Creatinine, Ser 1.04; Hemoglobin 12.9; Platelets 196; Potassium 3.8; Sodium 138   Lipid Panel    Component Value Date/Time   CHOL 106 05/23/2015 0958   TRIG 89.0 05/23/2015 0958   TRIG 25 03/31/2010   HDL 34.40 (L) 05/23/2015 0958   CHOLHDL 3 05/23/2015 0958   VLDL 17.8 05/23/2015 0958   LDLCALC 54 05/23/2015 0958    Additional studies/ records that were reviewed today include:   TAVR OPERATIVE NOTE   Date of Procedure:09/14/2018       Transcatheter Aortic Valve Replacement - PercutaneousRightTransfemoral Approach  Edwards Sapien 3 THV (size 66m, model # 9600TFX, serial # [id number]    Co-Surgeons:Michael CBurt Knack MD andClarence H. ORoxy Manns MD      Pre-operative Echo Findings:  - Severe aortic stenosis  - Normalleft ventricular systolic function    Post-operative Echo Findings:  - Noparavalvular leak  - Unchangedleft ventricular systolic function    __________________    Post operative echo 09/15/18:  Study Conclusions  - Left ventricle: The cavity size was normal. Wall thickness was  increased in a pattern of mild LVH. Systolic function was normal.  The estimated ejection fraction was in the range of 55% to 65%.  Wall motion was normal; there were no  regional wall motion  abnormalities.  - Aortic valve: A bioprosthesis was present. Valve area (VTI): 2.35  cm^2. Valve area (Vmax): 2.53 cm^2. Valve area (Vmean): 2.4 cm^2.  - Mitral valve: There was mild regurgitation.  - Left atrium: The atrium was mildly dilated.  - Right atrium: The atrium was moderately dilated.  - Pulmonary arteries: Systolic pressure was moderately increased.  PA peak pressure: 52 mm Hg (S).    ________________  Echo 10/14/18 Study Conclusions  - Left ventricle: The cavity size was normal. Wall thickness was   increased in a pattern of mild LVH. Systolic function was   severely reduced. The estimated ejection fraction was in the   range of 25% to 30%. - Aortic valve: A bioprosthesis was present. There was trivial   regurgitation. Valve area (VTI): 2.43 cm^2. Valve area (Vmax):   2.31 cm^2. Valve area (Vmean): 2.5 cm^2. - Mitral valve: There was mild to moderate regurgitation. - Left atrium: The atrium was severely dilated. - Right ventricle: The cavity size was mildly dilated. - Right atrium: The atrium was severely dilated. - Tricuspid valve: There was mild-moderate regurgitation.   ASSESSMENT & PLAN:   Severe AS s/p TAVR: significant drop in his LVEF from 55-65% to 25-30% in 09/2018, unchanged on the most recent echo in June 2020, normally functioning bioprosthetic TAVR valve with no paravalvular leak.  Acute on chronic systolic CHF -The patient is asymptomatic but has chronic lower extremity edema, he admits to not taking his diuretics every day -We will continue Lasix  80 mg daily and add metolazone 2.5 mg twice a week on Mondays and Thursdays, also add potassium chloride 20 mEq daily  HTN: BP well controlled today.    Chronic afib: rate well controlled. Continue coumadin for thromboembolic prophylaxis.  EKG today shows atrial fibrillation with ventricular rate 70 bpm and left bundle branch block unchanged from prior.  CAD: pre TAVR cath  showed calcified coronaries with mild non obst CAD and mod CAD in LAD (70% pLAD stenosis). Medical therapy was recommended.    Deconditioning: He is doing better, he continues to live independently with help of his relatives and visiting nurse.  He has hard time getting off the chair we will try to order a lift chair for him.  Medication Adjustments/Labs and Tests Ordered: Current medicines are reviewed at length with the patient today.  Concerns regarding medicines are outlined above.  Medication changes, Labs and Tests ordered today are listed in the Patient Instructions below. Patient Instructions  Medication Instructions:   START TAKING YOUR METOLAZONE 2.5 MG BY MOUTH TWICE WEEKLY.  TAKE ON MONDAYS AND THURSDAYS   If you need a refill on your cardiac medications before your next appointment, please call your pharmacy.     Follow-Up:  4  MONTHS WITH DR [name] [name] AS A REGULAR OFFICE VISIT      *DR [name] [name] HAS HANDWRITTEN YOU A PRESCRIPTION TO HAVE A LIFT CHAIR*    Signed, Ena Dawley, MD  06/06/2019 10:26 PM    [facility] [address], [place], [place]  [address] Phone: [phone]; Fax: [phone]

## 2019-06-06 NOTE — Patient Instructions (Signed)
Medication Instructions:   START TAKING YOUR METOLAZONE 2.5 MG BY MOUTH TWICE WEEKLY.  TAKE ON MONDAYS AND THURSDAYS   If you need a refill on your cardiac medications before your next appointment, please call your pharmacy.     Follow-Up:  4  MONTHS WITH DR Meda Coffee AS A REGULAR OFFICE VISIT      *DR Meda Coffee HAS HANDWRITTEN YOU A PRESCRIPTION TO HAVE A LIFT CHAIR*

## 2019-06-08 ENCOUNTER — Ambulatory Visit: Payer: Medicare Other | Admitting: General Practice

## 2019-06-11 ENCOUNTER — Other Ambulatory Visit: Payer: Self-pay | Admitting: Adult Health

## 2019-06-11 DIAGNOSIS — Z7901 Long term (current) use of anticoagulants: Secondary | ICD-10-CM

## 2019-06-13 ENCOUNTER — Inpatient Hospital Stay (HOSPITAL_BASED_OUTPATIENT_CLINIC_OR_DEPARTMENT_OTHER): Payer: Medicare Other | Admitting: Hematology

## 2019-06-13 ENCOUNTER — Other Ambulatory Visit: Payer: Self-pay

## 2019-06-13 ENCOUNTER — Other Ambulatory Visit: Payer: Self-pay | Admitting: *Deleted

## 2019-06-13 ENCOUNTER — Inpatient Hospital Stay: Payer: Medicare Other | Attending: Hematology

## 2019-06-13 VITALS — BP 121/50 | HR 69 | Temp 99.7°F | Resp 18 | Ht 71.5 in | Wt 193.8 lb

## 2019-06-13 DIAGNOSIS — Z87891 Personal history of nicotine dependence: Secondary | ICD-10-CM

## 2019-06-13 DIAGNOSIS — D508 Other iron deficiency anemias: Secondary | ICD-10-CM

## 2019-06-13 DIAGNOSIS — I2510 Atherosclerotic heart disease of native coronary artery without angina pectoris: Secondary | ICD-10-CM | POA: Insufficient documentation

## 2019-06-13 DIAGNOSIS — D638 Anemia in other chronic diseases classified elsewhere: Secondary | ICD-10-CM

## 2019-06-13 DIAGNOSIS — M069 Rheumatoid arthritis, unspecified: Secondary | ICD-10-CM | POA: Insufficient documentation

## 2019-06-13 LAB — CMP (CANCER CENTER ONLY)
ALT: 13 U/L (ref 0–44)
AST: 23 U/L (ref 15–41)
Albumin: 3.4 g/dL — ABNORMAL LOW (ref 3.5–5.0)
Alkaline Phosphatase: 52 U/L (ref 38–126)
Anion gap: 9 (ref 5–15)
BUN: 19 mg/dL (ref 8–23)
CO2: 32 mmol/L (ref 22–32)
Calcium: 9.3 mg/dL (ref 8.9–10.3)
Chloride: 95 mmol/L — ABNORMAL LOW (ref 98–111)
Creatinine: 1.19 mg/dL (ref 0.61–1.24)
GFR, Est AFR Am: >60 mL/min (ref >60)
GFR, Estimated: 53 mL/min — ABNORMAL LOW (ref >60)
Glucose, Bld: 101 mg/dL — ABNORMAL HIGH (ref 70–99)
Potassium: 3.3 mmol/L — ABNORMAL LOW (ref 3.5–5.1)
Sodium: 136 mmol/L (ref 135–145)
Total Bilirubin: 0.6 mg/dL (ref 0.3–1.2)
Total Protein: 6.3 g/dL — ABNORMAL LOW (ref 6.5–8.1)

## 2019-06-13 LAB — CBC WITH DIFFERENTIAL (CANCER CENTER ONLY)
Abs Immature Granulocytes: 0.03 10*3/uL (ref 0.00–0.07)
Basophils Absolute: 0 10*3/uL (ref 0.0–0.1)
Basophils Relative: 0 %
Eosinophils Absolute: 0.2 10*3/uL (ref 0.0–0.5)
Eosinophils Relative: 2 %
HCT: 36.8 % — ABNORMAL LOW (ref 39.0–52.0)
Hemoglobin: 12.2 g/dL — ABNORMAL LOW (ref 13.0–17.0)
Immature Granulocytes: 0 %
Lymphocytes Relative: 9 %
Lymphs Abs: 0.6 10*3/uL — ABNORMAL LOW (ref 0.7–4.0)
MCH: 32.7 pg (ref 26.0–34.0)
MCHC: 33.2 g/dL (ref 30.0–36.0)
MCV: 98.7 fL (ref 80.0–100.0)
Monocytes Absolute: 0.7 10*3/uL (ref 0.1–1.0)
Monocytes Relative: 10 %
Neutro Abs: 5.6 10*3/uL (ref 1.7–7.7)
Neutrophils Relative %: 79 %
Platelet Count: 170 10*3/uL (ref 150–400)
RBC: 3.73 MIL/uL — ABNORMAL LOW (ref 4.22–5.81)
RDW: 13.2 % (ref 11.5–15.5)
WBC Count: 7.1 10*3/uL (ref 4.0–10.5)
nRBC: 0 % (ref 0.0–0.2)

## 2019-06-13 LAB — FERRITIN: Ferritin: 140 ng/mL (ref 24–336)

## 2019-06-13 LAB — IRON AND TIBC
Iron: 93 ug/dL (ref 42–163)
Saturation Ratios: 36 % (ref 20–55)
TIBC: 259 ug/dL (ref 202–409)
UIBC: 166 ug/dL (ref 117–376)

## 2019-06-13 LAB — SAMPLE TO BLOOD BANK

## 2019-06-13 NOTE — Progress Notes (Signed)
Marland Kitchen  HEMATOLOGY ONCOLOGY PROGRESS NOTE  Date of service: 04/08/18  Patient Care Team: [name], NP as PCP - General (Family Medicine) Dorothy Spark, MD as PCP - Cardiology (Cardiology) Lazaro Arms, RN as [facility]  CC: f/u for Anemia   INTERVAL HISTORY: [name] [name] is here for followup his multifactorial anemia. The patient's last visit with [place] was on 11/04/2018. The pt reports that he is doing well overall.  The pt reports that he has been at home. He does note some issues with his balance. He has been having some problems with his prostrate, and he was prescribed flomax, but he notes that it hasn't been working well for him recently.   Lab results today (06/13/19) of CBC w/diff and CMP is as follows: all values are WNL except for RBC at 3.73, hemoglobin at 12.2, HCT at 36.8, lymphs abs at 0.6, potassium at 3.3, chloride at 95, glucose at 101, total protein at 6.3, albumin at 3.4, and GFR est non af at 53.  On review of systems, pt denies other symptoms.    REVIEW OF SYSTEMS:   A 10+ POINT REVIEW OF SYSTEMS WAS OBTAINED including neurology, dermatology, psychiatry, cardiac, respiratory, lymph, extremities, GI, GU, Musculoskeletal, constitutional, breasts, reproductive, HEENT.  All pertinent positives are noted in the HPI.  All others are negative.    . Past Medical History:  Diagnosis Date  . Atrial fibrillation, chronic    a. on coumadin   . Carotid artery occlusion    right total internal artery occlusion  . Chronic diastolic CHF (congestive heart failure) ([place])   . Coronary artery disease    s/p cardiac cath in 1990s, and cardiolite study  in 2005 showing EF 54%  . Degenerative disc disease    cervical spine  . GERD (gastroesophageal reflux disease)   . HIV infection ([place])   . Hyperlipidemia   . Hypertension   . Hypothyroidism   . Rheumatoid arthritis ([place])   . S/P TAVR (transcatheter aortic valve replacement)    Edwards  Sapien 3 THV (size 29 mm) via the R TF approach   . Skin cancer    forehead  . Stroke [facility])    TIA   . TIA (transient ischemic attack)   . Urgency incontinence     . Past Surgical History:  Procedure Laterality Date  . AMPUTATION Right 02/21/2015   Procedure: Right 3rd Ray Amputation;  Surgeon: Newt Minion, MD;  Location: [place];  Service: Orthopedics;  Laterality: Right;  Digital block and ankle block with MAC  . CARDIAC CATHETERIZATION  1990s  . CATARACT EXTRACTION W/ INTRAOCULAR LENS  IMPLANT, BILATERAL    . EYE SURGERY     cataracts bilateral  . INTRAOPERATIVE TRANSTHORACIC ECHOCARDIOGRAM  09/14/2018   Procedure: INTRAOPERATIVE TRANSTHORACIC ECHOCARDIOGRAM;  Surgeon: Sherren Mocha, MD;  Location: [place];  Service: Open Heart Surgery;;  . RIGHT/LEFT HEART CATH AND CORONARY ANGIOGRAPHY N/A 07/21/2018   Procedure: RIGHT/LEFT HEART CATH AND CORONARY ANGIOGRAPHY;  Surgeon: Sherren Mocha, MD;  Location: [place] CV LAB;  Service: Cardiovascular;  Laterality: N/A;  . SKIN CANCER EXCISION  12/2011   forehead  . TRANSCATHETER AORTIC VALVE REPLACEMENT, TRANSFEMORAL N/A 09/14/2018   Procedure: TRANSCATHETER AORTIC VALVE REPLACEMENT, TRANSFEMORAL. 30m EDWARDS SAPIEN3 THV.;  Surgeon: CSherren Mocha MD;  Location[place]  Service: Open Heart Surgery;  Laterality: N/A;    . Social History   Tobacco Use  . Smoking status: Former Smoker  Packs/day: 1.00    Years: 40.00    Pack years: 40.00    Types: Cigarettes    Quit date: 09/12/1989    Years since quitting: 29.7  . Smokeless tobacco: Never Used  . Tobacco comment: quit around 1992  Substance Use Topics  . Alcohol use: No    Alcohol/week: 0.0 standard drinks    Comment: former, quit drinking in 1992  . Drug use: No    ALLERGIES:  is allergic to levofloxacin and klor-con [potassium chloride er].  MEDICATIONS:  Current Outpatient Medications  Medication Sig Dispense Refill  . aspirin 81 MG tablet Take 81 mg by mouth at  bedtime.    Marland Kitchen atorvastatin (LIPITOR) 10 MG tablet TAKE 1 TABLET AT BEDTIME 90 tablet 2  . Calcium-Vitamin D (CALTRATE 600 PLUS-VIT D PO) Take 1 tablet by mouth at bedtime.     . docusate sodium (COLACE) 100 MG capsule Take 1 capsule (100 mg total) by mouth daily as needed for mild constipation. 30 capsule 6  . folic acid (FOLVITE) 1 MG tablet Take 1 mg by mouth daily.     . furosemide (LASIX) 40 MG tablet Take 2 tablets (80 mg total) by mouth every morning. 180 tablet 3  . guaiFENesin (MUCINEX) 600 MG 12 hr tablet Take 600-1,200 mg by mouth 2 (two) times daily as needed (cold/congestion.).    Marland Kitchen methotrexate (RHEUMATREX) 2.5 MG tablet Take 10 mg by mouth once a week. Caution:Chemotherapy. Protect from light.    . metolazone (ZAROXOLYN) 2.5 MG tablet Take 1 tablet (2.5 mg total) by mouth 2 (two) times a week. Take on Mondays and Thursdays. 30 tablet 3  . Multiple Vitamin (MULTIVITAMIN WITH MINERALS) TABS tablet Take 1 tablet by mouth daily.    . nitroGLYCERIN (NITROSTAT) 0.4 MG SL tablet Place 1 tablet (0.4 mg total) under the tongue every 5 (five) minutes as needed for chest pain. 25 tablet 0  . potassium chloride SA (K-DUR,KLOR-CON) 20 MEQ tablet Take 2 tablets (40 mEq total) by mouth every morning. 180 tablet 3  . Probiotic Product (HEALTHY COLON PO) Take 1 capsule by mouth daily.    . tamsulosin (FLOMAX) 0.4 MG CAPS capsule Take 1 capsule (0.4 mg total) by mouth every evening. 90 capsule 3  . TOPROL XL 25 MG 24 hr tablet TAKE ONE-HALF TABLET (12.5 MG TOTAL) DAILY WITH OR IMMEDIATELY FOLLOWING A MEAL (DOSE CHANGE) 45 tablet 2  . warfarin (COUMADIN) 5 MG tablet Take 1 tablet daily except 1 1/2 Mon Wed Fri or TAKE AS DIRECTED BY ANTICOAGULATION CLINIC 120 tablet 1   No current facility-administered medications for this visit.     PHYSICAL EXAMINATION:  ECOG PERFORMANCE STATUS: 1 - Symptomatic but completely ambulatory  Vitals:   06/13/19 1426  BP: (!) 121/50  Pulse: 69  Resp: 18  Temp:  99.7 F (37.6 C)  SpO2: 95%   Filed Weights   06/13/19 1426  Weight: 193 lb 12.8 oz (87.9 kg)   Body mass index is 26.65 kg/m.  Examined in a wheelchair  GENERAL:alert, in no acute distress and comfortable SKIN: no acute rashes, no significant lesions EYES: conjunctiva are pink and non-injected, sclera anicteric OROPHARYNX: MMM, no exudates, no oropharyngeal erythema or ulceration NECK: supple, no JVD LYMPH:  no palpable lymphadenopathy in the cervical, axillary or inguinal regions LUNGS: clear to auscultation b/l with normal respiratory effort HEART: regular rate & rhythm ABDOMEN:  normoactive bowel sounds , non tender, not distended. No palpable hepatosplenomegaly.  Extremity: 2-3+ pedal  edema PSYCH: alert & oriented x 3 with fluent speech NEURO: no focal motor/sensory deficits   LABORATORY DATA:    CBC Latest Ref Rng & Units 06/13/2019 03/16/2019 11/04/2018  WBC 4.0 - 10.5 K/uL 7.1 7.2 7.3  Hemoglobin 13.0 - 17.0 g/dL 12.2(L) 12.9(L) 10.2(L)  Hematocrit 39.0 - 52.0 % 36.8(L) 38.3 32.4(L)  Platelets 150 - 400 K/uL 170 196 233   . CBC    Component Value Date/Time   WBC 7.1 06/13/2019 1331   WBC 7.3 11/04/2018 1310   RBC 3.73 (L) 06/13/2019 1331   HGB 12.2 (L) 06/13/2019 1331   HGB 12.9 (L) 03/16/2019 1446   HGB 11.0 (L) 05/05/2017 1437   HCT 36.8 (L) 06/13/2019 1331   HCT 38.3 03/16/2019 1446   HCT 33.7 (L) 05/05/2017 1437   PLT 170 06/13/2019 1331   PLT 196 03/16/2019 1446   MCV 98.7 06/13/2019 1331   MCV 100 (H) 03/16/2019 1446   MCV 96.8 05/05/2017 1437   MCH 32.7 06/13/2019 1331   MCHC 33.2 06/13/2019 1331   RDW 13.2 06/13/2019 1331   RDW 12.6 03/16/2019 1446   RDW 15.3 (H) 05/05/2017 1437   LYMPHSABS 0.6 (L) 06/13/2019 1331   LYMPHSABS 1.0 03/16/2019 1446   LYMPHSABS 1.0 05/05/2017 1437   MONOABS 0.7 06/13/2019 1331   MONOABS 0.4 05/05/2017 1437   EOSABS 0.2 06/13/2019 1331   EOSABS 0.2 03/16/2019 1446   BASOSABS 0.0 06/13/2019 1331   BASOSABS 0.0  03/16/2019 1446   BASOSABS 0.0 05/05/2017 1437    . CMP Latest Ref Rng & Units 06/13/2019 03/16/2019 02/07/2019  Glucose 70 - 99 mg/dL 101(H) 76 92  BUN 8 - 23 mg/dL _0 Creatinine 0.61 - 1.24 mg/dL 1.19 1.04 1.11  Sodium 135 - 145 mmol/L 136 138 135  Potassium 3.5 - 5.1 mmol/L 3.3(L) 3.8 3.7  Chloride 98 - 111 mmol/L 95(L) 96 93(L)  CO2 22 - 32 mmol/L 32 29 28  Calcium 8.9 - 10.3 mg/dL 9.3 9.3 9.3  Total Protein 6.5 - 8.1 g/dL 6.3(L) 6.4 -  Total Bilirubin 0.3 - 1.2 mg/dL 0.6 0.5 -  Alkaline Phos 38 - 126 U/L 52 59 -  AST 15 - 41 U/L 23 26 -  ALT 0 - 44 U/L 13 15 -   . Lab Results  Component Value Date   IRON 93 06/13/2019   TIBC 259 06/13/2019   IRONPCTSAT 36 06/13/2019   (Iron and TIBC)  Lab Results  Component Value Date   FERRITIN 140 06/13/2019   .  RADIOGRAPHIC STUDIES: I have personally reviewed the radiological images as listed and agreed with the findings in the report. No results found.  ASSESSMENT & PLAN:   85 y.o. male with   1) Multifactorial Normocytic Anemia. This appears to be primarily related to anemia of chronic inflammation related to his rheumatoid arthritis along with perhaps some element of functional iron deficiency. It was also partially related to his methotrexate which he is off of since November 2017 but has been put back on. . Lab Results  Component Value Date   IRON 93 06/13/2019   TIBC 259 06/13/2019   IRONPCTSAT 36 06/13/2019   (Iron and TIBC)  Lab Results  Component Value Date   FERRITIN 140 06/13/2019    Cannot rule out an element of low level MDS. Monitoring for GI bleeding on anticoagulation.  PLAN:  -Discussed pt labwork today, 06/13/19; all values are WNL except for RBC at 3.73, hemoglobin at  12.2, HCT at 36.8, lymphs abs at 0.6, potassium at 3.3, chloride at 95, glucose at 101, total protein at 6.3, albumin at 3.4, and GFR est non af at 53. -no indication for additional IV Iron at this time. --Discussed follow  up with PCP or urologist for prostrate issues  FOLLOW UP: RTC with Dr Irene Limbo with labs in 6 months    The total time spent in the appt was 15 minutes and more than 50% was on counseling and direct patient cares.     Sullivan Lone MD MS AAHIVMS [facility] Hematology/Oncology Physician [facility]  (Office):       [phone] (Work cell):  [phone] (Fax):           [phone]  I, Jacqualyn Posey, am acting as a scribe for Dr. Sullivan Lone.   .I have reviewed the above documentation for accuracy and completeness, and I agree with the above. Brunetta Genera MD

## 2019-06-14 ENCOUNTER — Telehealth: Payer: Self-pay | Admitting: Hematology

## 2019-06-14 NOTE — Telephone Encounter (Signed)
Scheduled appt per 7/20 los. ° °Printed and mailed appt calendar. °

## 2019-06-15 ENCOUNTER — Other Ambulatory Visit: Payer: Self-pay

## 2019-06-15 NOTE — Patient Outreach (Signed)
[facility]) Care Management  06/15/2019   [name] [name] 01-08-1925 [id number]  Subjective: Successful outreach.  Two identifiers given.  The patient states that he is doing good.  He denies any shortness of breath, swelling,cough, pain or falls.  He states that he did have some swelling in his legs a couple of weeks ago but they have gone down since.  He is taking his furosemide to help remove the fluid.  Advised him when he is sitting to elevate his legs.  He verbalized understanding.  He states that he is eating well.  His children fix food for him and he has some one to come in the evenings .  He is taking his meds as prescribed.  He states that he has been doing exercises that he was given.  He will follow up with cardiology in four  months.  Current Medications:  Current Outpatient Medications  Medication Sig Dispense Refill  . aspirin 81 MG tablet Take 81 mg by mouth at bedtime.    Marland Kitchen atorvastatin (LIPITOR) 10 MG tablet TAKE 1 TABLET AT BEDTIME 90 tablet 2  . Calcium-Vitamin D (CALTRATE 600 PLUS-VIT D PO) Take 1 tablet by mouth at bedtime.     . docusate sodium (COLACE) 100 MG capsule Take 1 capsule (100 mg total) by mouth daily as needed for mild constipation. 30 capsule 6  . folic acid (FOLVITE) 1 MG tablet Take 1 mg by mouth daily.     . furosemide (LASIX) 40 MG tablet Take 2 tablets (80 mg total) by mouth every morning. 180 tablet 3  . guaiFENesin (MUCINEX) 600 MG 12 hr tablet Take 600-1,200 mg by mouth 2 (two) times daily as needed (cold/congestion.).    Marland Kitchen methotrexate (RHEUMATREX) 2.5 MG tablet Take 10 mg by mouth once a week. Caution:Chemotherapy. Protect from light.    . metolazone (ZAROXOLYN) 2.5 MG tablet Take 1 tablet (2.5 mg total) by mouth 2 (two) times a week. Take on Mondays and Thursdays. 30 tablet 3  . Multiple Vitamin (MULTIVITAMIN WITH MINERALS) TABS tablet Take 1 tablet by mouth daily.    . nitroGLYCERIN (NITROSTAT) 0.4 MG SL tablet Place 1 tablet  (0.4 mg total) under the tongue every 5 (five) minutes as needed for chest pain. 25 tablet 0  . potassium chloride SA (K-DUR,KLOR-CON) 20 MEQ tablet Take 2 tablets (40 mEq total) by mouth every morning. 180 tablet 3  . Probiotic Product (HEALTHY COLON PO) Take 1 capsule by mouth daily.    . tamsulosin (FLOMAX) 0.4 MG CAPS capsule Take 1 capsule (0.4 mg total) by mouth every evening. 90 capsule 3  . TOPROL XL 25 MG 24 hr tablet TAKE ONE-HALF TABLET (12.5 MG TOTAL) DAILY WITH OR IMMEDIATELY FOLLOWING A MEAL (DOSE CHANGE) 45 tablet 2  . warfarin (COUMADIN) 5 MG tablet TAKE 1 TABLET DAILY EXCEPT TAKE ONE AND ONE-HALF TABLETS ON MONDAY, WEDNESDAY, AND FRIDAY OR TAKE AS DIRECTED BY ANTICOAGULATION CLINIC. 120 tablet 1   No current facility-administered medications for this visit.     Functional Status:  In your present state of health, do you have any difficulty performing the following activities: 02/08/2019 10/19/2018  Hearing? N N  Vision? Y N  Comment Patient states that he is going soon but has not made an appointment.  He will call. -  Difficulty concentrating or making decisions? N N  Walking or climbing stairs? Y Y  Dressing or bathing? Y N  Doing errands, shopping? Tempie Donning  Comment he  does n ot drive daughters  drive him -  Conservation officer, nature and eating ? - N  Using the Toilet? - Y  In the past six months, have you accidently leaked urine? - Y  Do you have problems with loss of bowel control? - Y  Managing your Medications? - Y  Managing your Finances? - Y  Housekeeping or managing your Housekeeping? - Y  Some recent data might be hidden    Fall/Depression Screening: Fall Risk  06/15/2019 04/12/2019 03/11/2019  Falls in the past year? 0 0 0  Comment - - -  Number falls in past yr: - - -  Injury with Fall? - - -  Risk for fall due to : - - -  Follow up - - -   PHQ 2/9 Scores 02/08/2019 10/19/2018 12/18/2017 05/21/2017 05/18/2015  PHQ - 2 Score 0 0 0 0 0    Assessment: Patient will  continue to benefit from health coach outreach for disease management and support. THN CM Care Plan Problem One     Most Recent Value  THN Long Term Goal   Patient will verbalizeno hospital admission for CHF exacerbation in the next 60 days  THN Long Term Goal Start Date  06/15/19  Interventions for Problem One Long Term Goal  Talked with the patient about signs and syptoms, discussed his diet, encouraged the patient to weigh daily and to continue the exercises that were given to him     Plan: [place] will contact patient in the month of September and patient agrees to next outreach.   Lazaro Arms RN, BSN, [place] Direct Dial:  [phone]  Fax: [phone]

## 2019-06-23 ENCOUNTER — Other Ambulatory Visit: Payer: Self-pay | Admitting: Cardiology

## 2019-06-23 DIAGNOSIS — Z952 Presence of prosthetic heart valve: Secondary | ICD-10-CM

## 2019-06-30 ENCOUNTER — Ambulatory Visit: Payer: Medicare Other

## 2019-07-27 ENCOUNTER — Other Ambulatory Visit: Payer: Self-pay | Admitting: Physician Assistant

## 2019-07-27 DIAGNOSIS — Z952 Presence of prosthetic heart valve: Secondary | ICD-10-CM

## 2019-07-29 ENCOUNTER — Other Ambulatory Visit: Payer: Self-pay

## 2019-07-29 ENCOUNTER — Ambulatory Visit (INDEPENDENT_AMBULATORY_CARE_PROVIDER_SITE_OTHER): Payer: Medicare Other | Admitting: Podiatry

## 2019-07-29 ENCOUNTER — Encounter: Payer: Self-pay | Admitting: Podiatry

## 2019-07-29 DIAGNOSIS — B351 Tinea unguium: Secondary | ICD-10-CM

## 2019-07-29 DIAGNOSIS — R609 Edema, unspecified: Secondary | ICD-10-CM

## 2019-07-29 DIAGNOSIS — D689 Coagulation defect, unspecified: Secondary | ICD-10-CM

## 2019-07-29 DIAGNOSIS — M79676 Pain in unspecified toe(s): Secondary | ICD-10-CM

## 2019-07-29 NOTE — Progress Notes (Signed)
Patient ID: [name] [name], male   DOB: September 30, 1925, 85 y.o.   MRN: [id number] HPI  Complaint:  Visit Type: Patient returns to my office for continued preventative foot care services. Complaint: Patient states" my nails have grown long and thick and become painful to walk and wear shoes" . He presents for preventative foot care services. No changes to ROS.    Podiatric Exam: Vascular: dorsalis pedis and posterior tibial pulses are negative.due to excessive swelling. In feet/legs. Capillary return is immediate. Temperature gradient is negative. Skin turgor WNL,   Sensorium: Normal Semmes Weinstein monofilament test. Normal tactile sensation bilaterally.  Nail Exam: Pt has thick disfigured discolored nails with subungual debris  entire nail hallux through fifth toenails both feet. Ulcer Exam: There is no evidence of ulcer or pre-ulcerative changes or infection. Orthopedic Exam: Muscle tone and strength are WNL. No limitations in general ROM. No crepitus or effusions noted. Foot type and digits show no abnormalities. Bony prominences are unremarkable. Amputation third toe right foot. Skin: No Porokeratosis. No infection or ulcers.   Diagnosis:  Onychomycosis  B/L. Pain in right toe, pain in left toes,   Treatment & Plan Procedures and Treatment: Consent by patient was obtained for treatment procedures. The patient understood the discussion of treatment and procedures well. All questions were answered thoroughly reviewed. Debridement of mycotic and hypertrophic toenails x 9  and clearing of subungual debris. No ulceration, no infection noted.  Return Visit-Office Procedure: Patient instructed to return to the office for a follow up visit 10 weeks for continued evaluation and treatment.  Gardiner Barefoot DPM

## 2019-08-05 DIAGNOSIS — X32XXXD Exposure to sunlight, subsequent encounter: Secondary | ICD-10-CM | POA: Diagnosis not present

## 2019-08-05 DIAGNOSIS — C4359 Malignant melanoma of other part of trunk: Secondary | ICD-10-CM | POA: Diagnosis not present

## 2019-08-05 DIAGNOSIS — L570 Actinic keratosis: Secondary | ICD-10-CM | POA: Diagnosis not present

## 2019-08-09 ENCOUNTER — Other Ambulatory Visit: Payer: Self-pay

## 2019-08-09 DIAGNOSIS — H40011 Open angle with borderline findings, low risk, right eye: Secondary | ICD-10-CM | POA: Diagnosis not present

## 2019-08-09 DIAGNOSIS — H524 Presbyopia: Secondary | ICD-10-CM | POA: Diagnosis not present

## 2019-08-09 DIAGNOSIS — Z961 Presence of intraocular lens: Secondary | ICD-10-CM | POA: Diagnosis not present

## 2019-08-09 DIAGNOSIS — H5211 Myopia, right eye: Secondary | ICD-10-CM | POA: Diagnosis not present

## 2019-08-09 DIAGNOSIS — H3530 Unspecified macular degeneration: Secondary | ICD-10-CM | POA: Diagnosis not present

## 2019-08-09 DIAGNOSIS — H40003 Preglaucoma, unspecified, bilateral: Secondary | ICD-10-CM | POA: Diagnosis not present

## 2019-08-09 DIAGNOSIS — H52223 Regular astigmatism, bilateral: Secondary | ICD-10-CM | POA: Diagnosis not present

## 2019-08-09 DIAGNOSIS — Z9849 Cataract extraction status, unspecified eye: Secondary | ICD-10-CM | POA: Diagnosis not present

## 2019-08-09 DIAGNOSIS — H40051 Ocular hypertension, right eye: Secondary | ICD-10-CM | POA: Diagnosis not present

## 2019-08-09 DIAGNOSIS — H5202 Hypermetropia, left eye: Secondary | ICD-10-CM | POA: Diagnosis not present

## 2019-08-09 NOTE — Patient Outreach (Signed)
[place] [facility]) Care Management  08/09/2019  [name] [name] 07-27-1925 [id number]    1st unsuccessful outreach to the patient for assessment.  No answer.  HIPAA compliant voicemail left with contact information.   Plan: RN Health Coach will send letter. RN Health Coach will make outreach attempt to the patient within thirty business days.  Lazaro Arms RN, BSN, [place] Direct Dial:  [phone]  Fax: [phone]

## 2019-08-16 ENCOUNTER — Ambulatory Visit: Payer: Medicare Other

## 2019-08-19 ENCOUNTER — Telehealth: Payer: Self-pay | Admitting: Hematology

## 2019-08-19 ENCOUNTER — Other Ambulatory Visit: Payer: Self-pay

## 2019-08-19 NOTE — Telephone Encounter (Signed)
Spoke with patient re 9/28 f/u. Per patient called dtr [name] and confirmed appointment with her.

## 2019-08-19 NOTE — Patient Outreach (Signed)
[facility]) Care Management  08/19/2019   [name] [name] Jun 09, 1925 [id number]   Subjective: Successful outreach.  Two patient identifiers giver.  The patient states that he is doing good.  He was still in the bed.  He denies any pain, shortness of breath, weakness or falls.  He does have some swelling but he states his physician knows about it and they are working on it.  He did not weigh this morning and states that he may weigh one to times a week.  Advised him of the importance of weighing daily.  He verbalized understanding.  He is taking his medications and eating well.  He states he has an appointment in October for an echo.  Objective:   Current Medications:  Current Outpatient Medications  Medication Sig Dispense Refill  . aspirin 81 MG tablet Take 81 mg by mouth at bedtime.    Marland Kitchen atorvastatin (LIPITOR) 10 MG tablet TAKE 1 TABLET AT BEDTIME 90 tablet 2  . Calcium-Vitamin D (CALTRATE 600 PLUS-VIT D PO) Take 1 tablet by mouth at bedtime.     . docusate sodium (COLACE) 100 MG capsule Take 1 capsule (100 mg total) by mouth daily as needed for mild constipation. 30 capsule 6  . folic acid (FOLVITE) 1 MG tablet Take 1 mg by mouth daily.     . furosemide (LASIX) 40 MG tablet Take 2 tablets (80 mg total) by mouth every morning. 180 tablet 3  . guaiFENesin (MUCINEX) 600 MG 12 hr tablet Take 600-1,200 mg by mouth 2 (two) times daily as needed (cold/congestion.).    Marland Kitchen methotrexate (RHEUMATREX) 2.5 MG tablet Take 10 mg by mouth once a week. Caution:Chemotherapy. Protect from light.    . metolazone (ZAROXOLYN) 2.5 MG tablet Take 1 tablet (2.5 mg total) by mouth 2 (two) times a week. Take on Mondays and Thursdays. 30 tablet 3  . Multiple Vitamin (MULTIVITAMIN WITH MINERALS) TABS tablet Take 1 tablet by mouth daily.    . nitroGLYCERIN (NITROSTAT) 0.4 MG SL tablet DISSOLVE 1 TABLET UNDER THE TONGUE EVERY 5 MINUTES AS NEEDED FOR CHEST PAIN 25 tablet 4  . potassium chloride SA  (K-DUR,KLOR-CON) 20 MEQ tablet Take 2 tablets (40 mEq total) by mouth every morning. 180 tablet 3  . Probiotic Product (HEALTHY COLON PO) Take 1 capsule by mouth daily.    . tamsulosin (FLOMAX) 0.4 MG CAPS capsule Take 1 capsule (0.4 mg total) by mouth every evening. 90 capsule 3  . TOPROL XL 25 MG 24 hr tablet TAKE ONE-HALF TABLET (12.5 MG TOTAL) DAILY WITH OR IMMEDIATELY FOLLOWING A MEAL (DOSE CHANGE) 45 tablet 2  . warfarin (COUMADIN) 5 MG tablet TAKE 1 TABLET DAILY EXCEPT TAKE ONE AND ONE-HALF TABLETS ON MONDAY, WEDNESDAY, AND FRIDAY OR TAKE AS DIRECTED BY ANTICOAGULATION CLINIC. 120 tablet 1   No current facility-administered medications for this visit.     Functional Status:  In your present state of health, do you have any difficulty performing the following activities: 02/08/2019 10/19/2018  Hearing? N N  Vision? Y N  Comment Patient states that he is going soon but has not made an appointment.  He will call. -  Difficulty concentrating or making decisions? N N  Walking or climbing stairs? Y Y  Dressing or bathing? Y N  Doing errands, shopping? Y Y  Comment he does n ot drive daughters  drive him -  Conservation officer, nature and eating ? - N  Using the Toilet? - Y  In the past  six months, have you accidently leaked urine? - Y  Do you have problems with loss of bowel control? - Y  Managing your Medications? - Y  Managing your Finances? - Y  Housekeeping or managing your Housekeeping? - Y  Some recent data might be hidden    Fall/Depression Screening: Fall Risk  06/15/2019 04/12/2019 03/11/2019  Falls in the past year? 0 0 0  Comment - - -  Number falls in past yr: - - -  Injury with Fall? - - -  Risk for fall due to : - - -  Follow up - - -   PHQ 2/9 Scores 02/08/2019 10/19/2018 12/18/2017 05/21/2017 05/18/2015  PHQ - 2 Score 0 0 0 0 0    Assessment: Patient will continue to benefit from health coach outreach for disease management and support. THN CM Care Plan Problem One     Most  Recent Value  THN Long Term Goal   Patient will verbalize no hospital admission for CHF exacerbation in the next 60 days  THN Long Term Goal Start Date  08/19/19  Interventions for Problem One Long Term Goal  Reviewed signs and symptoms , action plan with the patient, encouraged diet, encouraged the patient to weigh daily, talked about the improtance of weighing daily, encouraged medication adherence     Plan: RN Health Coach will contact patient in the month of November and patient agrees to next outreach.   Lazaro Arms RN, BSN, [place] Direct Dial:  [phone]  Fax: [phone]

## 2019-08-20 NOTE — Progress Notes (Signed)
Marland Kitchen  HEMATOLOGY ONCOLOGY PROGRESS NOTE  Date of service: 04/08/18  [name] Care Team: [name], NP as PCP - General (Family Medicine) Dorothy Spark, MD as PCP - Cardiology (Cardiology) Lazaro Arms, RN as [place] Management  CC: f/u for Anemia   INTERVAL HISTORY: [name] [name] is here for followup his multifactorial anemia. The [name]'s last visit with [place] was on 06/13/2019. The pt reports that he is doing well overall.  The pt reports continued leg swelling but denies SOB. Pt is still taking Methotrexate and Folic Acid which he states is controlling his RA. He is currently taking Lasix. He lives by himself but his daughter comes in during the evening to help him with dinner. She also prepares his meals in advance for him. Pt uses a cane to help him move around the house. He has been seeing a dermatologist for his skin spots and about 2 weeks ago some skin removed on his back.   Lab results today (08/22/19) of CBC w/diff and CMP is as follows: all values are WNL except for RBC at 3.78, Hgb at 12.3. HCT at 37.4. 08/22/2019 Ferritin at 126 08/22/2019 Iron and TIBC is as follows: Iron at 81, TIBC at 250, Sat Ratios at 32, UIBC at 169  [name] was recently seen by his dermatologist Dr. Nevada Crane after he was noted to have a concerning hyperpigmented lesion on his right lower back which was ulcerated and leaking serosanguineous exudates. He underwent a shave biopsy on 08/05/2019 which showed malignant melanoma with a Breslow depth of 3.4 mm.  pT3b NX MX. [name] reports no focal symptoms suggestive of new shortness of breath abdominal pain headaches or focal neurological deficits.  Notes no new lumps or bumps.   On review of systems, pt reports leg swelling and denies SOB, fevers, chills, chest pain, bleeding concerns, bloody/black stools, mouth sores and any other symptoms.    REVIEW OF SYSTEMS:    A 10+ POINT REVIEW OF SYSTEMS WAS OBTAINED including neurology,  dermatology, psychiatry, cardiac, respiratory, lymph, extremities, GI, GU, Musculoskeletal, constitutional, breasts, reproductive, HEENT.  All pertinent positives are noted in the HPI.  All others are negative.   . Past Medical History:  Diagnosis Date  . Atrial fibrillation, chronic    a. on coumadin   . Carotid artery occlusion    right total internal artery occlusion  . Chronic diastolic CHF (congestive heart failure) ([place])   . Coronary artery disease    s/p cardiac cath in 1990s, and cardiolite study  in 2005 showing EF 54%  . Degenerative disc disease    cervical spine  . GERD (gastroesophageal reflux disease)   . HIV infection ([place])   . Hyperlipidemia   . Hypertension   . Hypothyroidism   . Rheumatoid arthritis ([place])   . S/P TAVR (transcatheter aortic valve replacement)    Edwards Sapien 3 THV (size 29 mm) via the R TF approach   . Skin cancer    forehead  . Stroke [facility])    TIA   . TIA (transient ischemic attack)   . Urgency incontinence     . Past Surgical History:  Procedure Laterality Date  . AMPUTATION Right 02/21/2015   Procedure: Right 3rd Ray Amputation;  Surgeon: Newt Minion, MD;  Location: [place];  Service: Orthopedics;  Laterality: Right;  Digital block and ankle block with MAC  . CARDIAC CATHETERIZATION  1990s  . CATARACT EXTRACTION W/ INTRAOCULAR LENS  IMPLANT, BILATERAL    .  EYE SURGERY     cataracts bilateral  . INTRAOPERATIVE TRANSTHORACIC ECHOCARDIOGRAM  09/14/2018   Procedure: INTRAOPERATIVE TRANSTHORACIC ECHOCARDIOGRAM;  Surgeon: Sherren Mocha, MD;  Location: [place];  Service: Open Heart Surgery;;  . RIGHT/LEFT HEART CATH AND CORONARY ANGIOGRAPHY N/A 07/21/2018   Procedure: RIGHT/LEFT HEART CATH AND CORONARY ANGIOGRAPHY;  Surgeon: Sherren Mocha, MD;  Location: [place] CV LAB;  Service: Cardiovascular;  Laterality: N/A;  . SKIN CANCER EXCISION  12/2011   forehead  . TRANSCATHETER AORTIC VALVE REPLACEMENT, TRANSFEMORAL N/A 09/14/2018    Procedure: TRANSCATHETER AORTIC VALVE REPLACEMENT, TRANSFEMORAL. 65m EDWARDS SAPIEN3 THV.;  Surgeon: CSherren Mocha MD;  Location[place]  Service: Open Heart Surgery;  Laterality: N/A;    . Social History   Tobacco Use  . Smoking status: Former Smoker    Packs/day: 1.00    Years: 40.00    Pack years: 40.00    Types: Cigarettes    Quit date: 09/12/1989    Years since quitting: 29.9  . Smokeless tobacco: Never Used  . Tobacco comment: quit around 1992  Substance Use Topics  . Alcohol use: No    Alcohol/week: 0.0 standard drinks    Comment: former, quit drinking in 1992  . Drug use: No    ALLERGIES:  is allergic to levofloxacin and klor-con [potassium chloride er].  MEDICATIONS:  Current Outpatient Medications  Medication Sig Dispense Refill  . aspirin 81 MG tablet Take 81 mg by mouth at bedtime.    .Marland Kitchenatorvastatin (LIPITOR) 10 MG tablet TAKE 1 TABLET AT BEDTIME 90 tablet 2  . Calcium-Vitamin D (CALTRATE 600 PLUS-VIT D PO) Take 1 tablet by mouth at bedtime.     . docusate sodium (COLACE) 100 MG capsule Take 1 capsule (100 mg total) by mouth daily as needed for mild constipation. 30 capsule 6  . folic acid (FOLVITE) 1 MG tablet Take 1 mg by mouth daily.     . furosemide (LASIX) 40 MG tablet Take 2 tablets (80 mg total) by mouth every morning. 180 tablet 3  . guaiFENesin (MUCINEX) 600 MG 12 hr tablet Take 600-1,200 mg by mouth 2 (two) times daily as needed (cold/congestion.).    .Marland Kitchenmethotrexate (RHEUMATREX) 2.5 MG tablet Take 10 mg by mouth once a week. Caution:Chemotherapy. Protect from light.    . metolazone (ZAROXOLYN) 2.5 MG tablet Take 1 tablet (2.5 mg total) by mouth 2 (two) times a week. Take on Mondays and Thursdays. 30 tablet 3  . Multiple Vitamin (MULTIVITAMIN WITH MINERALS) TABS tablet Take 1 tablet by mouth daily.    . nitroGLYCERIN (NITROSTAT) 0.4 MG SL tablet DISSOLVE 1 TABLET UNDER THE TONGUE EVERY 5 MINUTES AS NEEDED FOR CHEST PAIN 25 tablet 4  . potassium chloride  SA (K-DUR,KLOR-CON) 20 MEQ tablet Take 2 tablets (40 mEq total) by mouth every morning. 180 tablet 3  . Probiotic Product (HEALTHY COLON PO) Take 1 capsule by mouth daily.    . tamsulosin (FLOMAX) 0.4 MG CAPS capsule Take 1 capsule (0.4 mg total) by mouth every evening. 90 capsule 3  . TOPROL XL 25 MG 24 hr tablet TAKE ONE-HALF TABLET (12.5 MG TOTAL) DAILY WITH OR IMMEDIATELY FOLLOWING A MEAL (DOSE CHANGE) 45 tablet 2  . warfarin (COUMADIN) 5 MG tablet TAKE 1 TABLET DAILY EXCEPT TAKE ONE AND ONE-HALF TABLETS ON MONDAY, WEDNESDAY, AND FRIDAY OR TAKE AS DIRECTED BY ANTICOAGULATION CLINIC. 120 tablet 1   No current facility-administered medications for this visit.     PHYSICAL EXAMINATION:  ECOG PERFORMANCE STATUS:  1 - Symptomatic but completely ambulatory  Vitals:   08/22/19 1244  BP: (!) 154/94  Pulse: 67  Temp: 98.7 F (37.1 C)  SpO2: 100%   Filed Weights   08/22/19 1244  Weight: 203 lb 1.6 oz (92.1 kg)   Body mass index is 27.93 kg/m.   Exam was given in a chair   GENERAL:alert, in no acute distress and comfortable SKIN: no rash rashes, hyperpigmented lesion on back s/p shave biopsy (per dermatology) EYES: conjunctiva are pink and non-injected, sclera anicteric OROPHARYNX: MMM, no exudates, no oropharyngeal erythema or ulceration NECK: supple, no JVD LYMPH:  no palpable lymphadenopathy in the cervical, axillary or inguinal regions LUNGS: clear to auscultation b/l with normal respiratory effort HEART: regular rate & rhythm ABDOMEN:  normoactive bowel sounds , non tender, not distended. No palpable hepatosplenomegaly.  Extremity: 2+ pedal edema PSYCH: alert & oriented x 3 with fluent speech NEURO: no focal motor/sensory deficits  LABORATORY DATA:    CBC Latest Ref Rng & Units 08/22/2019 06/13/2019 03/16/2019  WBC 4.0 - 10.5 K/uL 7.5 7.1 7.2  Hemoglobin 13.0 - 17.0 g/dL 12.3(L) 12.2(L) 12.9(L)  Hematocrit 39.0 - 52.0 % 37.4(L) 36.8(L) 38.3  Platelets 150 - 400 K/uL 199 170  196   . CBC    Component Value Date/Time   WBC 7.5 08/22/2019 1302   RBC 3.78 (L) 08/22/2019 1302   HGB 12.3 (L) 08/22/2019 1302   HGB 12.2 (L) 06/13/2019 1331   HGB 12.9 (L) 03/16/2019 1446   HGB 11.0 (L) 05/05/2017 1437   HCT 37.4 (L) 08/22/2019 1302   HCT 38.3 03/16/2019 1446   HCT 33.7 (L) 05/05/2017 1437   PLT 199 08/22/2019 1302   PLT 170 06/13/2019 1331   PLT 196 03/16/2019 1446   MCV 98.9 08/22/2019 1302   MCV 100 (H) 03/16/2019 1446   MCV 96.8 05/05/2017 1437   MCH 32.5 08/22/2019 1302   MCHC 32.9 08/22/2019 1302   RDW 13.6 08/22/2019 1302   RDW 12.6 03/16/2019 1446   RDW 15.3 (H) 05/05/2017 1437   LYMPHSABS 0.8 08/22/2019 1302   LYMPHSABS 1.0 03/16/2019 1446   LYMPHSABS 1.0 05/05/2017 1437   MONOABS 0.8 08/22/2019 1302   MONOABS 0.4 05/05/2017 1437   EOSABS 0.2 08/22/2019 1302   EOSABS 0.2 03/16/2019 1446   BASOSABS 0.0 08/22/2019 1302   BASOSABS 0.0 03/16/2019 1446   BASOSABS 0.0 05/05/2017 1437    . CMP Latest Ref Rng & Units 06/13/2019 03/16/2019 02/07/2019  Glucose 70 - 99 mg/dL 101(H) 76 92  BUN 8 - 23 mg/dL _0 Creatinine 0.61 - 1.24 mg/dL 1.19 1.04 1.11  Sodium 135 - 145 mmol/L 136 138 135  Potassium 3.5 - 5.1 mmol/L 3.3(L) 3.8 3.7  Chloride 98 - 111 mmol/L 95(L) 96 93(L)  CO2 22 - 32 mmol/L 32 29 28  Calcium 8.9 - 10.3 mg/dL 9.3 9.3 9.3  Total Protein 6.5 - 8.1 g/dL 6.3(L) 6.4 -  Total Bilirubin 0.3 - 1.2 mg/dL 0.6 0.5 -  Alkaline Phos 38 - 126 U/L 52 59 -  AST 15 - 41 U/L 23 26 -  ALT 0 - 44 U/L 13 15 -   . Lab Results  Component Value Date   IRON 81 08/22/2019   TIBC 250 08/22/2019   IRONPCTSAT 32 08/22/2019   (Iron and TIBC)  Lab Results  Component Value Date   FERRITIN 126 08/22/2019     RADIOGRAPHIC STUDIES: I have personally reviewed the radiological images as  listed and agreed with the findings in the report. No results found.  ASSESSMENT & PLAN:   85 y.o. male with   1) Multifactorial Normocytic Anemia. This  appears to be primarily related to anemia of chronic inflammation related to his rheumatoid arthritis along with perhaps some element of functional iron deficiency. It was also partially related to his methotrexate which he is off of since November 2017 but has been put back on. . Lab Results  Component Value Date   IRON 81 08/22/2019   TIBC 250 08/22/2019   IRONPCTSAT 32 08/22/2019   (Iron and TIBC)  Lab Results  Component Value Date   FERRITIN 126 08/22/2019    Cannot rule out an element of low level MDS. Monitoring for GI bleeding on anticoagulation.  PLAN:  -Discussed pt labwork today, 08/22/19; all values are WNL except for RBC at 3.78, Hgb at 12.3. HCT at 37.4. 08/22/2019 Ferritin at 126 08/22/2019 Iron and TIBC is as follows: Iron at 81, TIBC at 250, Sat Ratios at 32, UIBC at 169 -Advised that we would like to keep iron high due to pt's RA; Goal Ferritin >100 -No indication for additional IV Iron at this time. -Will see back in 6 months with labs   2) newly diagnosed pT3b NX MX malignant melanoma of his right lower back. Plan -Pathology results, diagnosis, natural history, typical diagnostic work-up and treatment approaches were discussed in details with the daughter since the [name] is hard of hearing and quite forgetful.  Daughter does mention that the [name] does not tolerate sedation or anesthesia well and is hopeful that any surgeries for the local skin lesion can be done under local anesthesia. -[name] has had a shave biopsy of his lesion showing malignant melanoma superficial spreading type. -We will refer the [name] to [facility] surgery for wide local excision.  Daughter mentions she prefers this will be done under local anesthesia. -We discussed the typical indication of sentinel lymph node biopsy which would be indicated with his thickness of 3.4 mm however we shall defer the need for this to his surgeon and also based on PET/CT results given the [name]'s  age and delicate overall medical condition. -We will order a PET CT scan to evaluate extent of disease. -Wide local excision as primary treatment hopefully will be possible.  If the [name] has stage III or IV disease on PET scan will have to discuss goals of care since [name] would not be the best candidate for adjuvant treatments or palliative immunotherapy. -If there is any evidence of metastatic disease on the PET scan the [name] will also need an MRI of the brain. -I spent 20 mins discussing all the results and plan of action with the [name]'s daughter [name] [name] on the phone after the clinic visit and she is agreeable with this plan and was grateful for the phone call.  FOLLOW UP: PET/CT in 1 week Referral to central [place] surgery in 1-2 week for consideration of WLE of newly diagnosed malignant melanoma RTC with Dr Irene Limbo in 3-4 weeks RTC with Dr Irene Limbo in 6 months with labs  The total time spent in the appt was 30 minutes and more than 50% was on counseling and direct [name] cares.  All of the [name]'s questions were answered with apparent satisfaction. The [name] knows to call the clinic with any problems, questions or concerns.    Sullivan Lone MD [place] [facility] Hematology/Oncology Physician [place]  (Office):  [phone] (Work cell):  [phone] (Fax):           [phone]  I, Yevette Edwards, am acting as a scribe for Dr. Sullivan Lone.   .I have reviewed the above documentation for accuracy and completeness, and I agree with the above. Brunetta Genera MD

## 2019-08-22 ENCOUNTER — Inpatient Hospital Stay: Payer: Medicare Other

## 2019-08-22 ENCOUNTER — Telehealth: Payer: Self-pay | Admitting: Hematology

## 2019-08-22 ENCOUNTER — Other Ambulatory Visit: Payer: Self-pay

## 2019-08-22 ENCOUNTER — Inpatient Hospital Stay: Payer: Medicare Other | Attending: Hematology | Admitting: Hematology

## 2019-08-22 VITALS — BP 154/94 | HR 67 | Temp 98.7°F | Ht 71.5 in | Wt 203.1 lb

## 2019-08-22 DIAGNOSIS — M069 Rheumatoid arthritis, unspecified: Secondary | ICD-10-CM | POA: Insufficient documentation

## 2019-08-22 DIAGNOSIS — D508 Other iron deficiency anemias: Secondary | ICD-10-CM

## 2019-08-22 DIAGNOSIS — D638 Anemia in other chronic diseases classified elsewhere: Secondary | ICD-10-CM

## 2019-08-22 DIAGNOSIS — D649 Anemia, unspecified: Secondary | ICD-10-CM | POA: Diagnosis not present

## 2019-08-22 DIAGNOSIS — I2510 Atherosclerotic heart disease of native coronary artery without angina pectoris: Secondary | ICD-10-CM

## 2019-08-22 DIAGNOSIS — C4359 Malignant melanoma of other part of trunk: Secondary | ICD-10-CM

## 2019-08-22 LAB — CBC WITH DIFFERENTIAL/PLATELET
Abs Immature Granulocytes: 0.03 10*3/uL (ref 0.00–0.07)
Basophils Absolute: 0 10*3/uL (ref 0.0–0.1)
Basophils Relative: 1 %
Eosinophils Absolute: 0.2 10*3/uL (ref 0.0–0.5)
Eosinophils Relative: 3 %
HCT: 37.4 % — ABNORMAL LOW (ref 39.0–52.0)
Hemoglobin: 12.3 g/dL — ABNORMAL LOW (ref 13.0–17.0)
Immature Granulocytes: 0 %
Lymphocytes Relative: 10 %
Lymphs Abs: 0.8 10*3/uL (ref 0.7–4.0)
MCH: 32.5 pg (ref 26.0–34.0)
MCHC: 32.9 g/dL (ref 30.0–36.0)
MCV: 98.9 fL (ref 80.0–100.0)
Monocytes Absolute: 0.8 10*3/uL (ref 0.1–1.0)
Monocytes Relative: 10 %
Neutro Abs: 5.7 10*3/uL (ref 1.7–7.7)
Neutrophils Relative %: 76 %
Platelets: 199 10*3/uL (ref 150–400)
RBC: 3.78 MIL/uL — ABNORMAL LOW (ref 4.22–5.81)
RDW: 13.6 % (ref 11.5–15.5)
WBC: 7.5 10*3/uL (ref 4.0–10.5)
nRBC: 0 % (ref 0.0–0.2)

## 2019-08-22 LAB — IRON AND TIBC
Iron: 81 ug/dL (ref 42–163)
Saturation Ratios: 32 % (ref 20–55)
TIBC: 250 ug/dL (ref 202–409)
UIBC: 169 ug/dL (ref 117–376)

## 2019-08-22 LAB — FERRITIN: Ferritin: 126 ng/mL (ref 24–336)

## 2019-08-22 NOTE — Telephone Encounter (Signed)
Scheduled appt per 9/28 los.  Spoke with pt and he is aware of his appt date and time.

## 2019-08-25 ENCOUNTER — Telehealth: Payer: Self-pay | Admitting: *Deleted

## 2019-08-25 ENCOUNTER — Other Ambulatory Visit: Payer: Self-pay | Admitting: Hematology

## 2019-08-25 NOTE — Telephone Encounter (Signed)
Records faxed to [facility] - att Amber - Release [id number]

## 2019-08-27 DIAGNOSIS — Z23 Encounter for immunization: Secondary | ICD-10-CM | POA: Diagnosis not present

## 2019-08-28 ENCOUNTER — Emergency Department (HOSPITAL_COMMUNITY)
Admission: EM | Admit: 2019-08-28 | Discharge: 2019-08-28 | Disposition: A | Discharge status: Home / Self Care | Payer: Medicare Other | Attending: Emergency Medicine | Admitting: Emergency Medicine

## 2019-08-28 ENCOUNTER — Encounter (HOSPITAL_COMMUNITY): Payer: Self-pay | Admitting: Emergency Medicine

## 2019-08-28 ENCOUNTER — Other Ambulatory Visit: Payer: Self-pay

## 2019-08-28 ENCOUNTER — Emergency Department (HOSPITAL_COMMUNITY): Payer: Medicare Other

## 2019-08-28 DIAGNOSIS — E039 Hypothyroidism, unspecified: Secondary | ICD-10-CM | POA: Diagnosis not present

## 2019-08-28 DIAGNOSIS — Z7982 Long term (current) use of aspirin: Secondary | ICD-10-CM | POA: Diagnosis not present

## 2019-08-28 DIAGNOSIS — R55 Syncope and collapse: Secondary | ICD-10-CM | POA: Diagnosis not present

## 2019-08-28 DIAGNOSIS — Z89421 Acquired absence of other right toe(s): Secondary | ICD-10-CM | POA: Diagnosis not present

## 2019-08-28 DIAGNOSIS — Z7901 Long term (current) use of anticoagulants: Secondary | ICD-10-CM | POA: Diagnosis not present

## 2019-08-28 DIAGNOSIS — Z043 Encounter for examination and observation following other accident: Secondary | ICD-10-CM | POA: Insufficient documentation

## 2019-08-28 DIAGNOSIS — R7989 Other specified abnormal findings of blood chemistry: Secondary | ICD-10-CM | POA: Diagnosis not present

## 2019-08-28 DIAGNOSIS — Z87891 Personal history of nicotine dependence: Secondary | ICD-10-CM | POA: Insufficient documentation

## 2019-08-28 DIAGNOSIS — R2243 Localized swelling, mass and lump, lower limb, bilateral: Secondary | ICD-10-CM | POA: Insufficient documentation

## 2019-08-28 DIAGNOSIS — I2510 Atherosclerotic heart disease of native coronary artery without angina pectoris: Secondary | ICD-10-CM | POA: Diagnosis not present

## 2019-08-28 DIAGNOSIS — Z21 Asymptomatic human immunodeficiency virus [HIV] infection status: Secondary | ICD-10-CM | POA: Diagnosis not present

## 2019-08-28 DIAGNOSIS — W19XXXA Unspecified fall, initial encounter: Secondary | ICD-10-CM | POA: Diagnosis not present

## 2019-08-28 DIAGNOSIS — I5032 Chronic diastolic (congestive) heart failure: Secondary | ICD-10-CM | POA: Insufficient documentation

## 2019-08-28 DIAGNOSIS — I959 Hypotension, unspecified: Secondary | ICD-10-CM | POA: Diagnosis not present

## 2019-08-28 DIAGNOSIS — S0990XA Unspecified injury of head, initial encounter: Secondary | ICD-10-CM | POA: Diagnosis not present

## 2019-08-28 DIAGNOSIS — Z85828 Personal history of other malignant neoplasm of skin: Secondary | ICD-10-CM | POA: Diagnosis not present

## 2019-08-28 DIAGNOSIS — I110 Hypertensive heart disease with heart failure: Secondary | ICD-10-CM | POA: Insufficient documentation

## 2019-08-28 DIAGNOSIS — R748 Abnormal levels of other serum enzymes: Secondary | ICD-10-CM | POA: Diagnosis not present

## 2019-08-28 DIAGNOSIS — Z79899 Other long term (current) drug therapy: Secondary | ICD-10-CM | POA: Diagnosis not present

## 2019-08-28 DIAGNOSIS — R609 Edema, unspecified: Secondary | ICD-10-CM | POA: Diagnosis not present

## 2019-08-28 LAB — BASIC METABOLIC PANEL
Anion gap: 10 (ref 5–15)
BUN: 27 mg/dL — ABNORMAL HIGH (ref 8–23)
CO2: 28 mmol/L (ref 22–32)
Calcium: 8.9 mg/dL (ref 8.9–10.3)
Chloride: 98 mmol/L (ref 98–111)
Creatinine, Ser: 1.55 mg/dL — ABNORMAL HIGH (ref 0.61–1.24)
GFR calc Af Amer: 44 mL/min — ABNORMAL LOW (ref >60)
GFR calc non Af Amer: 38 mL/min — ABNORMAL LOW (ref >60)
Glucose, Bld: 125 mg/dL — ABNORMAL HIGH (ref 70–99)
Potassium: 3.9 mmol/L (ref 3.5–5.1)
Sodium: 136 mmol/L (ref 135–145)

## 2019-08-28 LAB — CBC
HCT: 38.4 % — ABNORMAL LOW (ref 39.0–52.0)
Hemoglobin: 12.6 g/dL — ABNORMAL LOW (ref 13.0–17.0)
MCH: 32.5 pg (ref 26.0–34.0)
MCHC: 32.8 g/dL (ref 30.0–36.0)
MCV: 99 fL (ref 80.0–100.0)
Platelets: 164 10*3/uL (ref 150–400)
RBC: 3.88 MIL/uL — ABNORMAL LOW (ref 4.22–5.81)
RDW: 13.9 % (ref 11.5–15.5)
WBC: 8.8 10*3/uL (ref 4.0–10.5)
nRBC: 0 % (ref 0.0–0.2)

## 2019-08-28 MED ORDER — SODIUM CHLORIDE 0.9 % IV BOLUS: 1000.0000 mL | Freq: Once | INTRAVENOUS | Status: DC

## 2019-08-28 MED ORDER — SODIUM CHLORIDE 0.9% FLUSH: 3.0000 mL | Freq: Once | INTRAVENOUS | Status: DC

## 2019-08-28 NOTE — Discharge Instructions (Addendum)
Your kidney function test (Creatinine level) was abnormal today.  It was 1.55.  This is very likely due to dehydration.  We advised that you drink lots of water at home for the next 2-3 weeks.  You should drink enough water that your urine looks light yellow or clear, and that you are not feeling thirsty during the day.    Your doctor should recheck your kidney function in 2 weeks to make sure it is stable or improving.  Please take extra precautions getting around your house for the next few days.  Always use your walker.  Take your time getting up from the bed or a chair.

## 2019-08-28 NOTE — ED Triage Notes (Signed)
Per EMS- pt here for eval post mechanical fall. Pt has edema to left foot that is worse than normal related to CHF. Pt has bilateral edema to legs ankles and feet. States the left has gotten worse. No CP SOB. Pt has weeping edema to left ankle.

## 2019-08-28 NOTE — ED Notes (Signed)
Patient transported to CT 

## 2019-08-28 NOTE — ED Provider Notes (Signed)
[facility] EMERGENCY DEPARTMENT Provider Note   CSN: [id number] Arrival date & time: 08/28/19  1420     History   Chief Complaint Chief Complaint  Patient presents with   Fall    HPI [name] [name] is a 85 y.o. male with a history of A. fib on Coumadin presenting to emergency department for mechanical fall.  The patient reports he was using his walker which got stuck in a doorway and he tripped over his legs.  Reports he fell to his knees and hands but denies striking his head on the ground.  He denies loss of consciousness.  He had difficulty getting up and had to call life alert for EMS.  He reports that he normally is able to walk only with a walker.  He has edema of the lower extremities and it causes difficulties with ambulation.  The patient is adamant that he did not become lightheaded or lose consciousness before or after his syncopal episode.  He reports it was simply an episode of getting his feet tangled up.  He has had no change to his Coumadin dosing.  In the ER he is completely asymptomatic and denies headache, nausea vomiting, neck pain, myalgias or arthralgias anywhere.     HPI  Past Medical History:  Diagnosis Date   Atrial fibrillation, chronic ([place])    a. on coumadin    Carotid artery occlusion    right total internal artery occlusion   Chronic diastolic CHF (congestive heart failure) ([place])    Coronary artery disease    s/p cardiac cath in 1990s, and cardiolite study  in 2005 showing EF 54%   Degenerative disc disease    cervical spine   GERD (gastroesophageal reflux disease)    HIV infection (HCC)    Hyperlipidemia    Hypertension    Hypothyroidism    Rheumatoid arthritis (HCC)    S/P TAVR (transcatheter aortic valve replacement)    Edwards Sapien 3 THV (size 29 mm) via the R TF approach    Skin cancer    forehead   Stroke (HCC)    TIA    TIA (transient ischemic attack)    Urgency incontinence     Patient  Active Problem List   Diagnosis Date Noted   Swelling 07/29/2019   S/P TAVR (transcatheter aortic valve replacement)    Urinary frequency 06/10/2018   Lower respiratory infection 12/18/2017   Iron deficiency anemia 12/16/2016   Severe aortic stenosis 04/10/2015   Chronic anticoagulation 02/17/2015   Bilateral carotid artery disease ([place]) 09/12/2014   Acute on chronic diastolic heart failure ([place]) 07/10/2014   Hypothyroidism 07/04/2013   Occlusion and stenosis of carotid artery without mention of cerebral infarction 05/18/2012   COPD with asthma ([place]) 01/26/2012   Long term (current) use of anticoagulants 02/24/2011   Atrial fibrillation ([place]) 01/12/2009   HLD (hyperlipidemia) 06/15/2007   Benign essential HTN 06/15/2007    Past Surgical History:  Procedure Laterality Date   AMPUTATION Right 02/21/2015   Procedure: Right 3rd Ray Amputation;  Surgeon: Newt Minion, MD;  Location: [place];  Service: Orthopedics;  Laterality: Right;  Digital block and ankle block with MAC   CARDIAC CATHETERIZATION  1990s   CATARACT EXTRACTION W/ INTRAOCULAR LENS  IMPLANT, BILATERAL     EYE SURGERY     cataracts bilateral   INTRAOPERATIVE TRANSTHORACIC ECHOCARDIOGRAM  09/14/2018   Procedure: INTRAOPERATIVE TRANSTHORACIC ECHOCARDIOGRAM;  Surgeon: Sherren Mocha, MD;  Location: [place];  Service: Open Heart  Surgery;;   RIGHT/LEFT HEART CATH AND CORONARY ANGIOGRAPHY N/A 07/21/2018   Procedure: RIGHT/LEFT HEART CATH AND CORONARY ANGIOGRAPHY;  Surgeon: Sherren Mocha, MD;  Location: [place] CV LAB;  Service: Cardiovascular;  Laterality: N/A;   SKIN CANCER EXCISION  12/2011   forehead   TRANSCATHETER AORTIC VALVE REPLACEMENT, TRANSFEMORAL N/A 09/14/2018   Procedure: TRANSCATHETER AORTIC VALVE REPLACEMENT, TRANSFEMORAL. 17m EDWARDS SAPIEN3 THV.;  Surgeon: CSherren Mocha MD;  Location[place]  Service: Open Heart Surgery;  Laterality: N/A;        Home Medications    Prior to  Admission medications   Medication Sig Start Date End Date Taking? Authorizing Provider  aspirin 81 MG tablet Take 81 mg by mouth at bedtime.    [provider]  atorvastatin (LIPITOR) 10 MG tablet TAKE 1 TABLET AT BEDTIME 04/19/19   NDorothy Spark MD  Calcium-Vitamin D (CALTRATE 600 PLUS-VIT D PO) Take 1 tablet by mouth at bedtime.     [provider]  docusate sodium (COLACE) 100 MG capsule Take 1 capsule (100 mg total) by mouth daily as needed for mild constipation. 09/17/18   TEileen Stanford PA-C  folic acid (FOLVITE) 1 MG tablet Take 1 mg by mouth daily.  03/06/16   [provider]  furosemide (LASIX) 40 MG tablet Take 2 tablets (80 mg total) by mouth every morning. 11/03/18   TEileen Stanford PA-C  guaiFENesin (MUCINEX) 600 MG 12 hr tablet Take 600-1,200 mg by mouth 2 (two) times daily as needed (cold/congestion.).    [provider]  methotrexate (RHEUMATREX) 2.5 MG tablet Take 10 mg by mouth once a week. Caution:Chemotherapy. Protect from light.    [provider]  metolazone (ZAROXOLYN) 2.5 MG tablet Take 1 tablet (2.5 mg total) by mouth 2 (two) times a week. Take on Mondays and Thursdays. 06/06/19   NDorothy Spark MD  Multiple Vitamin (MULTIVITAMIN WITH MINERALS) TABS tablet Take 1 tablet by mouth daily.    [provider]  nitroGLYCERIN (NITROSTAT) 0.4 MG SL tablet DISSOLVE 1 TABLET UNDER THE TONGUE EVERY 5 MINUTES AS NEEDED FOR CHEST PAIN 06/23/19   NDorothy Spark MD  potassium chloride SA (K-DUR,KLOR-CON) 20 MEQ tablet Take 2 tablets (40 mEq total) by mouth every morning. 10/14/18   TEileen Stanford PA-C  Probiotic Product (HEALTHY COLON PO) Take 1 capsule by mouth daily.    [provider]  tamsulosin (FLOMAX) 0.4 MG CAPS capsule Take 1 capsule (0.4 mg total) by mouth every evening. 05/11/19   NDorothy Spark MD  TOPROL XL 25 MG 24 hr tablet TAKE ONE-HALF TABLET (12.5 MG TOTAL) DAILY WITH OR  IMMEDIATELY FOLLOWING A MEAL (DOSE CHANGE) 05/30/19   NDorothy Spark MD  warfarin (COUMADIN) 5 MG tablet TAKE 1 TABLET DAILY EXCEPT TAKE ONE AND ONE-HALF TABLETS ON MONDAY, WEDNESDAY, AND FRIDAY OR TAKE AS DIRECTED BY ANTICOAGULATION CLINIC. Patient taking differently: Take 5-7.5 mg by mouth daily. Take 1 1/2 tablets (7.5 mg) by mouth on Monday, Wednesday, Friday, take 1 tablet (5 mg) on Sunday, Tuesday, Thursday, Saturday - or as directed by anticoagulation clinic 06/14/19   NDorothyann Peng NP  diphenhydrAMINE (BENADRYL) 25 MG tablet Take 25 mg by mouth every 6 (six) hours as needed. For itching  06/06/19  [provider]    Family History Family History  Problem Relation Age of Onset   Heart disease Mother    Malignant hyperthermia Mother    Hypertension Mother    Heart disease  Father    Malignant hyperthermia Father    Hyperlipidemia Father    Hypertension Father    Heart disease Sister    Hypertension Sister     Social History Social History   Tobacco Use   Smoking status: Former Smoker    Packs/day: 1.00    Years: 40.00    Pack years: 40.00    Types: Cigarettes    Quit date: 09/12/1989    Years since quitting: 29.9   Smokeless tobacco: Never Used   Tobacco comment: quit around 1992  Substance Use Topics   Alcohol use: No    Alcohol/week: 0.0 standard drinks    Comment: former, quit drinking in 1992   Drug use: No     Allergies   Levofloxacin and Klor-con [potassium chloride er]   Review of Systems Review of Systems  Constitutional: Negative for chills and fever.  Respiratory: Negative for cough and shortness of breath.   Cardiovascular: Negative for chest pain and palpitations.  Gastrointestinal: Negative for abdominal pain, nausea and vomiting.  Musculoskeletal: Negative for arthralgias, back pain, gait problem, myalgias, neck pain and neck stiffness.  Skin: Negative for pallor and rash.  Neurological: Negative for dizziness, syncope,  weakness, light-headedness, numbness and headaches.  Psychiatric/Behavioral: Negative for agitation and confusion.  All other systems reviewed and are negative.    Physical Exam Updated Vital Signs BP 130/64    Pulse 83    Temp 99.5 F (37.5 C) (Oral)    Resp 13    SpO2 96%   Physical Exam Vitals signs and nursing note reviewed.  Constitutional:      Appearance: He is well-developed.  HENT:     Head: Normocephalic and atraumatic. No raccoon eyes, Battle's sign, abrasion or contusion.  Eyes:     Extraocular Movements: Extraocular movements intact.     Pupils: Pupils are equal, round, and reactive to light.  Neck:     Musculoskeletal: Normal range of motion and neck supple. No neck rigidity or muscular tenderness.  Cardiovascular:     Rate and Rhythm: Normal rate. Rhythm irregular.     Pulses: Normal pulses.  Pulmonary:     Effort: Pulmonary effort is normal. No respiratory distress.  Abdominal:     General: There is no distension.     Palpations: Abdomen is soft. There is no mass.     Tenderness: There is no abdominal tenderness.  Musculoskeletal: Normal range of motion.        General: No swelling, tenderness, deformity or signs of injury.     Right lower leg: Edema present.     Left lower leg: Edema present.     Comments: No spinal midline tenderness  Skin:    General: Skin is warm and dry.  Neurological:     General: No focal deficit present.     Mental Status: He is alert and oriented to person, place, and time.  Psychiatric:        Mood and Affect: Mood normal.        Behavior: Behavior normal.      ED Treatments / Results  Labs (all labs ordered are listed, but only abnormal results are displayed) Labs Reviewed  BASIC METABOLIC PANEL - Abnormal; Notable for the following components:      Result Value   Glucose, Bld 125 (*)    BUN 27 (*)    Creatinine, Ser 1.55 (*)    GFR calc non Af Amer 38 (*)    GFR calc Af Wyvonnia Lora  44 (*)    All other components within  normal limits  CBC - Abnormal; Notable for the following components:   RBC 3.88 (*)    Hemoglobin 12.6 (*)    HCT 38.4 (*)    All other components within normal limits    EKG EKG Interpretation  Date/Time:  Sunday August 28 2019 14:37:34 EDT Ventricular Rate:  82 PR Interval:    QRS Duration: 150 QT Interval:  434 QTC Calculation: 507 R Axis:   -81 Text Interpretation:  Atrial fibrillation with premature ventricular or aberrantly conducted complexes Left axis deviation Left bundle branch block Abnormal ECG LBBB noted on prior ECG No STEMI, negative sgarbossa criteria  Confirmed by Octaviano Glow [phone]) on 08/28/2019 2:45:31 PM   Radiology Ct Head Wo Contrast  Result Date: 08/28/2019 CLINICAL DATA:  Fall from standing on anticoagulation with Coumadin. EXAM: CT HEAD WITHOUT CONTRAST TECHNIQUE: Contiguous axial images were obtained from the base of the skull through the vertex without intravenous contrast. COMPARISON:  09/08/2013 head CT. FINDINGS: Brain: Right frontal encephalomalacia. No evidence of parenchymal hemorrhage or extra-axial fluid collection. No mass lesion, mass effect, or midline shift. No CT evidence of acute infarction. Generalized cerebral volume loss. Nonspecific moderate subcortical and periventricular white matter hypodensity, most in keeping with chronic small vessel ischemic change. No ventriculomegaly. Vascular: No acute abnormality. Skull: No evidence of calvarial fracture. Sinuses/Orbits: No significant fluid levels. Scattered small mucous retention cysts versus polyps in the inferior maxillary sinuses bilaterally. Mild mucoperiosteal thickening in the ethmoidal air cells bilaterally. Other:  The mastoid air cells are unopacified. IMPRESSION: 1. No evidence of acute intracranial abnormality. No evidence of calvarial fracture. 2. Generalized cerebral volume loss, right frontal encephalomalacia and moderate chronic small vessel ischemic changes in the cerebral white  matter. 3. Mild bilateral paranasal sinusitis, chronic appearing. Electronically Signed   By: Ilona Sorrel M.D.   On: 08/28/2019 17:45    Procedures Procedures (including critical care time)  Medications Ordered in ED Medications  sodium chloride flush (NS) 0.9 % injection 3 mL (has no administration in time range)     Initial Impression / Assessment and Plan / ED Course  I have reviewed the triage vital signs and the nursing notes.  Pertinent labs & imaging results that were available during my care of the patient were reviewed by me and considered in my medical decision making (see chart for details).  85 year old gentleman on Coumadin presenting to the emergency department with a mechanical fall earlier today.  Denies head trauma or loss of consciousness.  On my exam he has no evidence of contusions or bruising.  He has no hip tenderness or instability.  He has no bony tenderness or bruising of the joints.  He has no spinal midline tenderness.  Given his age and the fact that he is on Coumadin, I still believe it is appropriate to obtain a CT scan of the brain.  The patient and his daughter agree with this.  If this is negative I suspect we will be able to discharge him home.   Clinical Course as of Aug 27 1953  Sun Aug 28, 2019  1748 IMPRESSION: 1. No evidence of acute intracranial abnormality. No evidence of calvarial fracture. 2. Generalized cerebral volume loss, right frontal encephalomalacia and moderate chronic small vessel ischemic changes in the cerebral white matter. 3. Mild bilateral paranasal sinusitis, chronic appearing.     [MT]  5621 Discussed the patient's imaging findings with him and his daughter.  Believe  it is reasonable to discharge him home at this time.  We also discussed his elevated creatinine here which I believe is due to poor p.o. hydration.  His daughter reports that he does not drink enough water at home, the patient reports his urine can be dark.  I  explained the utmost importance that he drink lots of fluids, enough to make his urine appears clear.  I told him that he should have 1 of his doctors recheck this Cr level in the next 2 weeks.  I will also put this on the discharge instructions.  They verbalized understanding.   [MT]  1801 We also discussed fall precautions.  The patient lives in a single-story house with a ramp walkway.  His daughter reports she can help him get home and he has a walker waiting for him in the house.  We will ensure that he is able to walk in the emergency department as per his baseline prior to discharge.   [MT]    Clinical Course User Index [MT] Wyvonnia Dusky, MD    Final Clinical Impressions(s) / ED Diagnoses   Final diagnoses:  Fall, initial encounter  Elevated serum creatinine    ED Discharge Orders    None       Aeric Burnham, Carola Rhine, MD 08/28/19 [phone]

## 2019-09-05 ENCOUNTER — Other Ambulatory Visit: Payer: Self-pay

## 2019-09-05 ENCOUNTER — Ambulatory Visit (HOSPITAL_COMMUNITY)
Admission: RE | Admit: 2019-09-05 | Discharge: 2019-09-05 | Disposition: A | Discharge status: Home / Self Care | Payer: Medicare Other | Source: Ambulatory Visit | Attending: Hematology | Admitting: Hematology

## 2019-09-05 DIAGNOSIS — M899 Disorder of bone, unspecified: Secondary | ICD-10-CM | POA: Insufficient documentation

## 2019-09-05 DIAGNOSIS — C4359 Malignant melanoma of other part of trunk: Secondary | ICD-10-CM | POA: Insufficient documentation

## 2019-09-05 DIAGNOSIS — I700 Atherosclerosis of aorta: Secondary | ICD-10-CM | POA: Diagnosis not present

## 2019-09-05 DIAGNOSIS — R600 Localized edema: Secondary | ICD-10-CM | POA: Insufficient documentation

## 2019-09-05 DIAGNOSIS — Z79899 Other long term (current) drug therapy: Secondary | ICD-10-CM | POA: Insufficient documentation

## 2019-09-05 LAB — GLUCOSE, CAPILLARY: Glucose-Capillary: 87 mg/dL (ref 70–99)

## 2019-09-05 MED ORDER — FLUDEOXYGLUCOSE F - 18 (FDG) INJECTION
10.9000 | Freq: Once | INTRAVENOUS | Status: AC
  Administered 2019-09-05: 12:00:00 10.9 via INTRAVENOUS

## 2019-09-06 ENCOUNTER — Telehealth: Payer: Self-pay | Admitting: Hematology

## 2019-09-06 NOTE — Telephone Encounter (Signed)
Called and discussed PET/CT results with the patient's daughter Erline Hau who is also his HPOA.  IMPRESSION: 1. Focus of FDG accumulation in the skin of the right flank region, the level of the posterior right eleventh rib. This could be related to the patient's primary lesion or granulation from biopsy. 2. Extensive edema noted in the lower extremities bilaterally with areas of focal hypermetabolic uptake identified in each leg, as above. Findings may be related to infection/inflammation, but cutaneous/subcutaneous neoplasm not excluded. 3. Focal hypermetabolism in the left oropharynx. Primary mucosal lesion not excluded. Direct visualization recommended to further evaluate as primary neoplasm not excluded. 4. Several hypermetabolic bone lesions without underlying abnormality visible on CT imaging. Appearance raises concern for metastatic disease. One of the most prominent lesions is in the L4 vertebral body. Lumbar spine MRI without and with contrast may prove helpful to further evaluate. 5.  Aortic Atherosclerois (ICD10-170.0)   Electronically Signed   By: Misty Stanley M.D.   On: 09/05/2019 15:58   Discussed that there are some nonspecific findings of hypermetabolic lesions in his lower extremities likely related to sores from his chronic bilateral lower extremity swelling.  He needs to continue skin screening with his dermatologist.  There are several hypermetabolic bone lesions without a clearly visible abnormality on the CT scan.  This would be difficult to have a needle biopsy to evaluate.  Cannot rule out early metastatic disease in the bones related to melanoma or alternative pathology.  Given his age daughter is in agreement to watchfully monitor these and consider tissue biopsy if there is a change in these.  Daughter would like to proceed with consideration of wide local excision of his primary skin melanoma under local anesthesia.  Given concern for possible  metastatic disease might potentially be able to forego sentinel lymph node biopsy from a staging standpoint. Patient has already been given a referral to Dr. Dalbert Batman.  Sullivan Lone MD [place] Physician [facility]

## 2019-09-09 ENCOUNTER — Other Ambulatory Visit: Payer: Self-pay | Admitting: *Deleted

## 2019-09-09 DIAGNOSIS — C4359 Malignant melanoma of other part of trunk: Secondary | ICD-10-CM

## 2019-09-12 ENCOUNTER — Telehealth: Payer: Self-pay | Admitting: *Deleted

## 2019-09-12 NOTE — Telephone Encounter (Signed)
[facility] [phone]. Spoke with Judson Roch, New Patient referral coordinator regarding referral on 08/25/2019 from Dr. 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 Fanny Skates.  Referral not rec'd in [place] at this time, it is in [place]. Judson Roch took patient information, including daughter's name. She will contact them.

## 2019-09-13 ENCOUNTER — Telehealth: Payer: Self-pay | Admitting: Hematology

## 2019-09-13 NOTE — Telephone Encounter (Signed)
Scheduled appt per 10/16 sch message - per pt daughter need appt in 7 instead of 6 wks. Pt daughter aware of appt date and time

## 2019-09-14 DIAGNOSIS — G459 Transient cerebral ischemic attack, unspecified: Secondary | ICD-10-CM | POA: Diagnosis not present

## 2019-09-14 DIAGNOSIS — R600 Localized edema: Secondary | ICD-10-CM | POA: Diagnosis not present

## 2019-09-14 DIAGNOSIS — Z7901 Long term (current) use of anticoagulants: Secondary | ICD-10-CM | POA: Diagnosis not present

## 2019-09-14 DIAGNOSIS — I482 Chronic atrial fibrillation, unspecified: Secondary | ICD-10-CM | POA: Diagnosis not present

## 2019-09-14 DIAGNOSIS — I779 Disorder of arteries and arterioles, unspecified: Secondary | ICD-10-CM | POA: Diagnosis not present

## 2019-09-14 DIAGNOSIS — C4359 Malignant melanoma of other part of trunk: Secondary | ICD-10-CM | POA: Diagnosis not present

## 2019-09-14 DIAGNOSIS — I10 Essential (primary) hypertension: Secondary | ICD-10-CM | POA: Diagnosis not present

## 2019-09-14 DIAGNOSIS — Z952 Presence of prosthetic heart valve: Secondary | ICD-10-CM | POA: Diagnosis not present

## 2019-09-14 DIAGNOSIS — I2510 Atherosclerotic heart disease of native coronary artery without angina pectoris: Secondary | ICD-10-CM | POA: Diagnosis not present

## 2019-09-15 ENCOUNTER — Telehealth: Payer: Self-pay | Admitting: *Deleted

## 2019-09-15 NOTE — Telephone Encounter (Signed)
   [facility] Pre-operative Risk Assessment    Request for surgical clearance:  1. What type of surgery is being performed? WIDE LOCAL EXCISION of MELANOMA RIGHT FLANK   2. When is this surgery scheduled? TBD   3. What type of clearance is required (medical clearance vs. Pharmacy clearance to hold med vs. Both)? PHARMACY  4. Are there any medications that need to be held prior to surgery and how long? WARFARIN; NEEDS LOVENOX BRIDGING?    5. Practice name and name of physician performing surgery? [facility]; DR. Dalbert Batman   6. What is your office phone number [phone]    7.   What is your office fax number [phone]  8.   Anesthesia type (None, local, MAC, general) ? MAC   [name] [name] 09/15/2019, 11:26 AM  _________________________________________________________________   (provider comments below)

## 2019-09-15 NOTE — Telephone Encounter (Signed)
Patient with diagnosis of afib on warfarin for anticoagulation.    Procedure:  WIDE LOCAL EXCISION of MELANOMA RIGHT FLANK  Date of procedure: TBD  CHADS2-VASc score of  7 (CHF, HTN, AGE, stroke/tia x 2, CAD, AGE)  Platelet count 164  Per office protocol, patient can hold warfarin for 5 days prior to procedure.    Patient WILL need bridging with Lovenox (enoxaparin) around procedure.  I will send to Villa Herb to make her aware. Patients warfarin is managed by primary care.

## 2019-09-15 NOTE — Telephone Encounter (Signed)
   Primary Cardiologist: Ena Dawley, MD   [name] 85 year old male with a past medical history of benign essential hypertension, atrial fibrillation, carotid artery stenosis, acute on chronic diastolic heart failure, bilateral carotid artery disease, TAVR, COPD, hypothyroidism, hyperlipidemia, iron deficiency anemia, and urinary frequency.  Chart reviewed as part of pre-operative protocol coverage. Given past medical history and time since last visit, based on ACC/AHA guidelines, [name] [name] would be at acceptable risk for the planned procedure without further cardiovascular testing.   He has an RCRI class III, 6.6% risk of a major cardiac event.  CHADS2-VASc score of  7 (CHF, HTN, AGE, stroke/tia x 2, CAD, AGE)  Platelet count 164  Per office protocol, patient can hold warfarin for 5 days prior to procedure.    Patient WILL need bridging with Lovenox (enoxaparin) around procedure.  Pharmacy will send a notification to Villa Herb to make her aware. Patients warfarin is managed by primary care.  I will route this recommendation to the requesting party via Epic fax function and remove from pre-op pool.  Please call with questions.  Jossie Ng. [facility] [place] [address] Office [phone] Fax [phone]

## 2019-09-15 NOTE — Telephone Encounter (Signed)
Absolutely, he can go for melanoma removal.

## 2019-09-16 ENCOUNTER — Telehealth: Payer: Self-pay | Admitting: General Practice

## 2019-09-16 NOTE — Telephone Encounter (Signed)
LMOVM for [name], patient's dtr to call Villa Herb, RN @ [phone] at her earliest convenience.  This is in regards to a upcoming procedure.  Patient will need instructions about when to hold coumadin and will need instructions about a Lovenox bridge as well.

## 2019-09-16 NOTE — Telephone Encounter (Signed)
[name], patient's dtr returned my call.  She states that patient has appointment with Cardiology on Monday and they will decide then if pt will proceed with the procedure.  I asked [name] to call me, Villa Herb, RN when she has more information.

## 2019-09-19 ENCOUNTER — Encounter: Payer: Medicare Other | Admitting: *Deleted

## 2019-09-19 ENCOUNTER — Encounter: Payer: Self-pay | Admitting: Cardiology

## 2019-09-19 ENCOUNTER — Other Ambulatory Visit: Payer: Self-pay

## 2019-09-19 ENCOUNTER — Ambulatory Visit (INDEPENDENT_AMBULATORY_CARE_PROVIDER_SITE_OTHER): Payer: Medicare Other | Admitting: Cardiology

## 2019-09-19 ENCOUNTER — Ambulatory Visit (HOSPITAL_COMMUNITY): Payer: Medicare Other | Attending: Cardiology

## 2019-09-19 ENCOUNTER — Other Ambulatory Visit (HOSPITAL_COMMUNITY): Payer: Medicare Other

## 2019-09-19 ENCOUNTER — Other Ambulatory Visit: Payer: Self-pay | Admitting: *Deleted

## 2019-09-19 VITALS — BP 138/68 | HR 64 | Ht 71.5 in | Wt 192.0 lb

## 2019-09-19 VITALS — BP 138/68 | HR 64 | Wt 191.8 lb

## 2019-09-19 DIAGNOSIS — I2510 Atherosclerotic heart disease of native coronary artery without angina pectoris: Secondary | ICD-10-CM

## 2019-09-19 DIAGNOSIS — Z952 Presence of prosthetic heart valve: Secondary | ICD-10-CM

## 2019-09-19 DIAGNOSIS — Z006 Encounter for examination for normal comparison and control in clinical research program: Secondary | ICD-10-CM

## 2019-09-19 NOTE — Patient Instructions (Addendum)
Medication Instructions:   STOP TAKING ASPIRIN NOW  *If you need a refill on your cardiac medications before your next appointment, please call your pharmacy*   Lab Work:  TODAY--CMET AND CBC W DIFF  If you have labs (blood work) drawn today and your tests are completely normal, you will receive your results only by: Marland Kitchen MyChart Message (if you have MyChart) OR . A paper copy in the mail If you have any lab test that is abnormal or we need to change your treatment, we will call you to review the results.    Follow-Up: At [facility], you and your health needs are our priority.  As part of our continuing mission to provide you with exceptional heart care, we have created designated Provider Care Teams.  These Care Teams include your primary Cardiologist (physician) and Advanced Practice Providers (APPs -  Physician Assistants and Nurse Practitioners) who all work together to provide you with the care you need, when you need it.  Your next appointment:   WITH DR. Meda Coffee ON December 14, 2019 AT 11:40 AM IN THE OFFICE  The format for your next appointment:   In Person  Provider:   Ena Dawley, MD

## 2019-09-19 NOTE — Progress Notes (Addendum)
[facility]    Cardiology Office Note    Date:  09/20/2019   ID:  [name] [name], DOB 09-Oct-1925, MRN [id number] PCP:  [name], NP  Cardiologist:  Dr. Meda Coffee / Dr. Burt Knack & Dr. Roxy Manns (TAVR)   CC: 4 months follow-up  History of Present Illness:  [name] [name] is a 85 y.o. male with a history of CAD, chronic diastolic CHF, longstanding persistant atrial fibrillation on coumadin, carotid artery disease with CTO RICA, history of TIA, HTN, HLD, hypothyroidism, urinary incontinence and severe aortic stenosis s/p TAVR (09/14/18) with a 29 mm Edwards Sapien 3 THV via the TF approach on 09/14/18. Post operative echoshowed EF 55%, normally functioning TAVR with mean gradient of 8 mm Hg and no PVL. ECG with afib with new LBBB but no high grade heart block. He was enrolled in the [facility]age trial and randomized back to Coumadin. He was discharged on Aspirin and resumed on home coumadin.  He was seen in the structural heart/TAVR clinic in November and was doing well.  Echocardiogram performed that they showed normal transvalvular gradients however significant drop in LVEF from 55 to 60% to 25 to 30%. He was seen again in July 2020 when he was doing well, living independently at home, only complaining of chronic lower extremity edema that was stable.  He is coming today accompanied by his son, the patient states that he has been feeling well, with stable lower extremity edema, but no orthopnea or proximal nocturnal dyspnea.  No palpitations dizziness or syncope.  He still lives independently and walks with a walker.  He denies any chest pain or unusual shortness of breath.  He was found to have a melanoma on his right flank, he underwent biopsy that showed local extension of the tumor and PET scan was performed that showed hypermetabolic activity in his right flank region where his primary melanoma, but also possible metastasis in the left oropharynx and several hypermetabolic bone  lesions with concern for metastatic disease. One of the most prominent lesions is in the L4 vertebral body.   Past Medical History:  Diagnosis Date   Atrial fibrillation, chronic (HCC)    a. on coumadin    Carotid artery occlusion    right total internal artery occlusion   Chronic diastolic CHF (congestive heart failure) ([place])    Coronary artery disease    s/p cardiac cath in 1990s, and cardiolite study  in 2005 showing EF 54%   Degenerative disc disease    cervical spine   GERD (gastroesophageal reflux disease)    HIV infection ([place])    Hyperlipidemia    Hypertension    Hypothyroidism    Rheumatoid arthritis (HCC)    S/P TAVR (transcatheter aortic valve replacement)    Edwards Sapien 3 THV (size 29 mm) via the R TF approach    Skin cancer    forehead   Stroke (HCC)    TIA    TIA (transient ischemic attack)    Urgency incontinence     Past Surgical History:  Procedure Laterality Date   AMPUTATION Right 02/21/2015   Procedure: Right 3rd Ray Amputation;  Surgeon: Newt Minion, MD;  Location: [facility];  Service: Orthopedics;  Laterality: Right;  Digital block and ankle block with MAC   CARDIAC CATHETERIZATION  1990s   CATARACT EXTRACTION W/ INTRAOCULAR LENS  IMPLANT, BILATERAL     EYE SURGERY     cataracts bilateral   INTRAOPERATIVE TRANSTHORACIC ECHOCARDIOGRAM  09/14/2018  Procedure: INTRAOPERATIVE TRANSTHORACIC ECHOCARDIOGRAM;  Surgeon: Sherren Mocha, MD;  Location: [facility];  Service: Open Heart Surgery;;   RIGHT/LEFT HEART CATH AND CORONARY ANGIOGRAPHY N/A 07/21/2018   Procedure: RIGHT/LEFT HEART CATH AND CORONARY ANGIOGRAPHY;  Surgeon: Sherren Mocha, MD;  Location: [facility];  Service: Cardiovascular;  Laterality: N/A;   SKIN CANCER EXCISION  12/2011   forehead   TRANSCATHETER AORTIC VALVE REPLACEMENT, TRANSFEMORAL N/A 09/14/2018   Procedure: TRANSCATHETER AORTIC VALVE REPLACEMENT, TRANSFEMORAL. 44m EDWARDS SAPIEN3 THV.;  Surgeon: CSherren Mocha MD;  Location: [facility]  Service: Open Heart Surgery;  Laterality: N/A;   Current Medications: Outpatient Medications Prior to Visit  Medication Sig Dispense Refill   atorvastatin (LIPITOR) 10 MG tablet TAKE 1 TABLET AT BEDTIME 90 tablet 2   Calcium-Vitamin D (CALTRATE 600 PLUS-VIT D PO) Take 1 tablet by mouth at bedtime.      docusate sodium (COLACE) 100 MG capsule Take 1 capsule (100 mg total) by mouth daily as needed for mild constipation. 30 capsule 6   folic acid (FOLVITE) 1 MG tablet Take 1 mg by mouth daily.      furosemide (LASIX) 40 MG tablet Take 2 tablets (80 mg total) by mouth every morning. 180 tablet 3   guaiFENesin (MUCINEX) 600 MG 12 hr tablet Take 600-1,200 mg by mouth 2 (two) times daily as needed (cold/congestion.).     methotrexate (RHEUMATREX) 2.5 MG tablet Take 10 mg by mouth once a week. Caution:Chemotherapy. Protect from light.     metolazone (ZAROXOLYN) 2.5 MG tablet Take 1 tablet (2.5 mg total) by mouth 2 (two) times a week. Take on Mondays and Thursdays. 30 tablet 3   Multiple Vitamin (MULTIVITAMIN WITH MINERALS) TABS tablet Take 1 tablet by mouth daily.     nitroGLYCERIN (NITROSTAT) 0.4 MG SL tablet DISSOLVE 1 TABLET UNDER THE TONGUE EVERY 5 MINUTES AS NEEDED FOR CHEST PAIN 25 tablet 4   potassium chloride SA (K-DUR,KLOR-CON) 20 MEQ tablet Take 2 tablets (40 mEq total) by mouth every morning. 180 tablet 3   Probiotic Product (HEALTHY COLON PO) Take 1 capsule by mouth daily.     tamsulosin (FLOMAX) 0.4 MG CAPS capsule Take 1 capsule (0.4 mg total) by mouth every evening. 90 capsule 3   TOPROL XL 25 MG 24 hr tablet TAKE ONE-HALF TABLET (12.5 MG TOTAL) DAILY WITH OR IMMEDIATELY FOLLOWING A MEAL (DOSE CHANGE) 45 tablet 2   warfarin (COUMADIN) 5 MG tablet TAKE 1 TABLET DAILY EXCEPT TAKE ONE AND ONE-HALF TABLETS ON MONDAY, WEDNESDAY, AND FRIDAY OR TAKE AS DIRECTED BY ANTICOAGULATION CLINIC. (Patient taking differently: Take 5-7.5 mg by mouth daily. Take  1 1/2 tablets (7.5 mg) by mouth on Monday, Wednesday, Friday, take 1 tablet (5 mg) on Sunday, Tuesday, Thursday, Saturday - or as directed by anticoagulation clinic) 120 tablet 1   aspirin 81 MG tablet Take 81 mg by mouth at bedtime.     No facility-administered medications prior to visit.      Allergies:   Levofloxacin and Klor-con [potassium chloride er]   Social History   Socioeconomic History   Marital status: Married    Spouse name: Not on file   Number of children: Not on file   Years of education: Not on file   Highest education level: Not on file  Occupational History   Not on file  Social Needs   Financial resource strain: Not on file   Food insecurity    Worry: Not on file    Inability: Not  on file   Transportation needs    Medical: Not on file    Non-medical: Not on file  Tobacco Use   Smoking status: Former Smoker    Packs/day: 1.00    Years: 40.00    Pack years: 40.00    Types: Cigarettes    Quit date: 09/12/1989    Years since quitting: 30.0   Smokeless tobacco: Never Used   Tobacco comment: quit around 1992  Substance and Sexual Activity   Alcohol use: No    Alcohol/week: 0.0 standard drinks    Comment: former, quit drinking in 1992   Drug use: No   Sexual activity: Not Currently  Lifestyle   Physical activity    Days per week: Not on file    Minutes per session: Not on file   Stress: Not on file  Relationships   Social connections    Talks on phone: Not on file    Gets together: Not on file    Attends religious service: Not on file    Active member of club or organization: Not on file    Attends meetings of clubs or organizations: Not on file    Relationship status: Not on file  Other Topics Concern   Not on file  Social History Narrative   Lives in [place] with his wife who has dementia, he is her primary caregiver   4 daughters    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     ROS:   Please see the history of present illness.    ROS All other systems reviewed and are negative.  PHYSICAL EXAM:   VS:  BP 138/68    Pulse 64    Ht 5' 11.5" (1.816 m)    Wt 192 lb (87.1 kg)    BMI 26.41 kg/m    GEN: Well nourished, well developed, in no acute distress HEENT: normal Neck: no JVD or masses Cardiac: irreg irreg; soft murmur. no rubs, or gallops, 2+ bilateral LE edema with chronic venous stasis changes.  Respiratory:  clear to auscultation bilaterally, normal work of breathing GI: soft, nontender, nondistended, + BS MS: no deformity or atrophy Skin: warm and dry, no rash Neuro:  Alert and Oriented x 3, Strength and sensation are intact Psych: euthymic mood, full affect  Wt Readings from Last 3 Encounters:  09/19/19 192 lb (87.1 kg)  08/22/19 203 lb 1.6 oz (92.1 kg)  06/13/19 193 lb 12.8 oz (87.9 kg)    Studies/Labs Reviewed:   EKG:  EKG is ordered today.  The ekg ordered today demonstrates aifb with PVCs, HR 67, LBBB  Recent Labs: 02/07/2019: NT-Pro BNP 2,119 09/19/2019: ALT WILL FOLLOW; BUN WILL FOLLOW; Creatinine, Ser WILL FOLLOW; Hemoglobin 12.8; Platelets 211; Potassium WILL FOLLOW; Sodium WILL FOLLOW   Lipid Panel    Component Value Date/Time   CHOL 106 05/23/2015 0958   TRIG 89.0 05/23/2015 0958   TRIG 25 03/31/2010   HDL 34.40 (L) 05/23/2015 0958   CHOLHDL 3 05/23/2015 0958   VLDL 17.8 05/23/2015 0958   LDLCALC 54 05/23/2015 0958    Additional studies/ records that were reviewed today include:    Post operative echo 09/15/18:  Study Conclusions  - Left ventricle: The cavity size was normal. Wall thickness was  increased in a pattern of mild LVH. Systolic function was normal.  The estimated ejection fraction was in the range of 55% to  65%.  Wall motion was normal; there were no regional wall motion  abnormalities.  - Aortic valve: A bioprosthesis was present. Valve area (VTI): 2.35  cm^2. Valve area (Vmax): 2.53 cm^2. Valve area (Vmean): 2.4 cm^2.  - Mitral valve: There was mild regurgitation.  - Left atrium: The atrium was mildly dilated.  - Right atrium: The atrium was moderately dilated.  - Pulmonary arteries: Systolic pressure was moderately increased.  PA peak pressure: 52 mm Hg (S).   ________________  Echo 10/14/18  - Left ventricle: The cavity size was normal. Wall thickness was   increased in a pattern of mild LVH. Systolic function was   severely reduced. The estimated ejection fraction was in the   range of 25% to 30%. - Aortic valve: A bioprosthesis was present. There was trivial   regurgitation. Valve area (VTI): 2.43 cm^2. Valve area (Vmax):   2.31 cm^2. Valve area (Vmean): 2.5 cm^2. - Mitral valve: There was mild to moderate regurgitation. - Left atrium: The atrium was severely dilated. - Right ventricle: The cavity size was mildly dilated. - Right atrium: The atrium was severely dilated. - Tricuspid valve: There was mild-moderate regurgitation.  TTE: 09/19/2019   1. Left ventricular ejection fraction, by visual estimation, is 20 to 25%. The left ventricle has severely decreased function. Mildly increased left ventricular size. There is no left ventricular hypertrophy.  2. Left ventricular diastolic function could not be evaluated pattern of LV diastolic filling.  3. Global right ventricle has normal systolic function.The right ventricular size is normal.  4. Left atrial size was severely dilated.  5. Right atrial size was severely dilated.  6. Mild mitral annular calcification.  7. The mitral valve is normal in structure. Mild mitral valve regurgitation. No evidence of mitral stenosis.  8. The tricuspid valve is normal in structure. Tricuspid valve regurgitation is mild.  9. Aortic  valve regurgitation is trivial by color flow Doppler. Structurally normal aortic valve, with no evidence of sclerosis or stenosis. 10. The pulmonic valve was not well visualized. Pulmonic valve regurgitation is trivial by color flow Doppler. 11. Aortic dilatation noted. 12. There is mild dilatation of the ascending aorta measuring 38 mm. 13. Mildly elevated pulmonary artery systolic pressure. 14. The inferior vena cava is normal in size with greater than 50% respiratory variability, suggesting right atrial pressure of 3 mmHg. 15. Severe global reduction in LV systolic function; mild LVE; mildly dilated ascending aorta; severe biatrial enlargement; s/p AVR with mean gradient of 6 mmHg and AVA 1.8 cm2 and trace AI; mild [name] and TR.  Whole-body PET scan 09/05/2019  Focus of FDG accumulation in the skin of the right flank region, the level of the posterior right eleventh rib. This could be related to the patient's primary lesion or granulation from biopsy. 2. Extensive edema noted in the lower extremities bilaterally with areas of focal hypermetabolic uptake identified in each leg, as above. Findings may be related to infection/inflammation, but cutaneous/subcutaneous neoplasm not excluded. 3. Focal hypermetabolism in the left oropharynx. Primary mucosal lesion not excluded. Direct visualization recommended to further evaluate as primary neoplasm not excluded. 4. Several hypermetabolic bone lesions without underlying abnormality visible on CT imaging. Appearance raises concern for metastatic disease. One of the most prominent lesions is in the L4 vertebral body. Lumbar spine MRI without and with contrast may prove helpful to further evaluate.  ASSESSMENT & PLAN:   Severe AS s/p TAVR: significant drop in his LVEF from 55-65% to 25-30% in 09/2018, unchanged on the recent echo performed today, normal transaortic gradient, no paravalvular leak.  Acute on chronic systolic CHF -The patient is  asymptomatic but has chronic lower extremity edema, he has lost 11 pounds since the last visit, been improvement of lower extremity edema however still residual edema.  NYHA class IIb. -We will check his labs today and adjust diuretics as needed. -He is currently taking Lasix 80 mg daily and add metolazone 2.5 mg twice a week on Mondays and Thursdays, also add potassium chloride 20 mEq daily  HTN: BP well controlled today.    Chronic afib: rate well controlled. Continue coumadin for thromboembolic prophylaxis.  EKG today shows atrial fibrillation with ventricular rate 70 bpm and left bundle branch block unchanged from prior.  CAD: pre TAVR cath showed calcified coronaries with mild non obst CAD and mod CAD in LAD (70% pLAD stenosis). Medical therapy was recommended.    Deconditioning: He is doing better, he continues to live independently with help of his relatives and visiting nurse.  He now uses a lift chair for getting out of chair.  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  The patient is being followed by a surgeon Dr. Fanny Skates at [facility] surgery and an oncologist Dr. Irene Limbo.  The patient is supposed to be scheduled for resection of his right flank melanoma in the near future however he has been informed that considering his polymorbidity he would not be a candidate for chemo or radiation therapy. The patient and his son are asking appropriate questions regarding benefit of the surgery.  I agree with them that in the absence of treatment of metastatic disease with chemo or radiation therapy there is probably no significant benefit of performing surgery for only primary cancer only. In addition, [name] [name] has previous history of clinical deterioration with exacerbation of heart failure and ICU admission after anesthesia with conscious  sedation.  Patient is advised to arrange for earlier appointment with Dr. Irene Limbo and discuss what is his prognosis with or without performing surgery.  Medication Adjustments/Labs and Tests Ordered: Current medicines are reviewed at length with the patient today.  Concerns regarding medicines are outlined above.  Medication changes, Labs and Tests ordered today are listed in the Patient Instructions below. Patient Instructions  Medication Instructions:   STOP TAKING ASPIRIN NOW  *If you need a refill on your cardiac medications before your next appointment, please call your pharmacy*   Lab Work:  TODAY--CMET AND CBC W DIFF  If you have labs (blood work) drawn today and your tests are completely normal, you will receive your results only by:  [facility] (if you have MyChart) OR  A paper copy in the mail If you have any lab test that is abnormal or we need to change your treatment, we will call you to review the results.    Follow-Up: At [facility], you and your health needs are our priority.  As part of our continuing mission to provide you with exceptional heart care, we have created designated Provider Care Teams.  These Care Teams include your primary Cardiologist (physician) and Advanced Practice Providers (APPs -  Physician Assistants and Nurse Practitioners) who all work together to provide you with the care you need, when  you need it.  Your next appointment:   WITH DR. Meda Coffee ON December 14, 2019 AT 11:40 AM IN THE OFFICE  The format for your next appointment:   In Person  Provider:   Ena Dawley, MD        Signed, Ena Dawley, MD  09/20/2019 7:09 AM    [facility] [address], [place], [place]  [address] Phone: [phone]; Fax: [phone]

## 2019-09-20 LAB — COMPREHENSIVE METABOLIC PANEL
ALT: 9 IU/L (ref 0–44)
AST: 21 IU/L (ref 0–40)
Albumin/Globulin Ratio: 1.6 (ref 1.2–2.2)
Albumin: 3.9 g/dL (ref 3.5–4.6)
Alkaline Phosphatase: 63 IU/L (ref 39–117)
BUN/Creatinine Ratio: 15 (ref 10–24)
BUN: 19 mg/dL (ref 10–36)
Bilirubin Total: 0.3 mg/dL (ref 0.0–1.2)
CO2: 26 mmol/L (ref 20–29)
Calcium: 9.4 mg/dL (ref 8.6–10.2)
Chloride: 99 mmol/L (ref 96–106)
Creatinine, Ser: 1.24 mg/dL (ref 0.76–1.27)
GFR calc Af Amer: 58 mL/min/{1.73_m2} — ABNORMAL LOW (ref >59)
GFR calc non Af Amer: 50 mL/min/{1.73_m2} — ABNORMAL LOW (ref >59)
Globulin, Total: 2.5 g/dL (ref 1.5–4.5)
Glucose: 101 mg/dL — ABNORMAL HIGH (ref 65–99)
Potassium: 3.5 mmol/L (ref 3.5–5.2)
Sodium: 141 mmol/L (ref 134–144)
Total Protein: 6.4 g/dL (ref 6.0–8.5)

## 2019-09-20 LAB — CBC WITH DIFFERENTIAL/PLATELET
Basophils Absolute: 0.1 10*3/uL (ref 0.0–0.2)
Basos: 1 %
EOS (ABSOLUTE): 0.2 10*3/uL (ref 0.0–0.4)
Eos: 3 %
Hematocrit: 38.1 % (ref 37.5–51.0)
Hemoglobin: 12.8 g/dL — ABNORMAL LOW (ref 13.0–17.7)
Immature Grans (Abs): 0 10*3/uL (ref 0.0–0.1)
Immature Granulocytes: 1 %
Lymphocytes Absolute: 0.9 10*3/uL (ref 0.7–3.1)
Lymphs: 12 %
MCH: 31.8 pg (ref 26.6–33.0)
MCHC: 33.6 g/dL (ref 31.5–35.7)
MCV: 95 fL (ref 79–97)
Monocytes Absolute: 0.8 10*3/uL (ref 0.1–0.9)
Monocytes: 11 %
Neutrophils Absolute: 5.4 10*3/uL (ref 1.4–7.0)
Neutrophils: 72 %
Platelets: 211 10*3/uL (ref 150–450)
RBC: 4.02 x10E6/uL — ABNORMAL LOW (ref 4.14–5.80)
RDW: 12.5 % (ref 11.6–15.4)
WBC: 7.4 10*3/uL (ref 3.4–10.8)

## 2019-09-22 ENCOUNTER — Telehealth: Payer: Self-pay | Admitting: General Practice

## 2019-09-22 ENCOUNTER — Telehealth: Payer: Self-pay | Admitting: *Deleted

## 2019-09-22 NOTE — Telephone Encounter (Signed)
Attempted to return a call to patient's daughter, [name] but no answer and no vm.  Will try again this afternoon or tomorrow.

## 2019-09-22 NOTE — Telephone Encounter (Signed)
Patient has decided not to proceed per daughter [name].

## 2019-09-22 NOTE — Telephone Encounter (Signed)
Patient's dtr [name] called to inform coumadin clinic that patient will not be having the procedure that we discussed previously.

## 2019-09-22 NOTE — Telephone Encounter (Signed)
Late entry from 10/26: Contacted patient's daughter after receiving faxed Telephone Advice fax from [place]. Daughter stated patient was referred to [facility] by Dr.Kale for biopsy and was initially scheduled for inpatient biopsy w/overnite stay and then it was changed to OP procedure. She stated patient is very weak, ambulating less at home and slower -  and has lost 10 lbs over last month.Daughter said on last ECHO heart is only functioning at 25% and she did not think he could tolerate the procedure. She does not think he can tolerate an OP biopsy at this time.  He has decided he doesn't want to have it done. Patient had recent appointment with cardiologist and discussed upcoming/ordered biopsy with MD. Cardiologist  recommended they discuss biopsy with Dr.Kale - and decide if it needed at this time since patient is not a candidate for chemo or radiation.  Patient's next appt with Dr.Kale is 12/9. Daughter verbalized understanding.

## 2019-10-05 ENCOUNTER — Other Ambulatory Visit: Payer: Self-pay

## 2019-10-05 ENCOUNTER — Ambulatory Visit (INDEPENDENT_AMBULATORY_CARE_PROVIDER_SITE_OTHER): Payer: Medicare Other | Admitting: General Practice

## 2019-10-05 DIAGNOSIS — Z7901 Long term (current) use of anticoagulants: Secondary | ICD-10-CM | POA: Diagnosis not present

## 2019-10-05 LAB — POCT INR: INR: 2.2 (ref 2.0–3.0)

## 2019-10-05 NOTE — Patient Instructions (Addendum)
Pre visit review using our clinic review tool, if applicable. No additional management support is needed unless otherwise documented below in the visit note.  Continue to take 1 (5 mg) tablet daily except 1 1/2 tablets (7.5 mg) on Monday/Wed/Fridays.  Re-check in 5 to 6 weeks.  Dosing details left of [name] [name]'s VM.   Call [name] [name] for any scheduling issues - [phone].

## 2019-10-06 NOTE — Progress Notes (Signed)
I have reviewed the results and agree with this plan   

## 2019-10-06 NOTE — Research (Signed)
Envisage Visit   Patient was seen over at Cardiology office.  EKG was completed 08/28/2019  Labs drawn  PACT-02 See chart for review  EQ-5D-5L MOBILITY:    I HAVE NO PROBLEMS WALKING [x]   I HAVE SLIGHT PROBLEMS WALKING []   I HAVE MODERATE PROBLEMS WALKING []   I HAVE SEVERE PROBLEMS WALKING []   I AM UNABLE TO WALK  []     SELF-CARE:   I HAVE NO PROBLEMS WASING OR DRESSING MYSELF  [x]   I HAVE SLIGHT PROBLEMS WASHING OR DRESSING MYSELF  []   I HAVE MODERATE PROBLEMS WASHING OR DRESSING MYSELF []   I HAVE SEVERE PROBLEMS WASHING OR DRESSING MYSELF  []   I HAVE SEVERE PROBLEMS WASHING OR DRESSING MYSELF  []   I AM UNABLE TO WASH OR DRESS MYSELF []     USUAL ACTIVITIES: (E.G. WORK/STUDY/HOUSEWORK/FAMILY OR LEISURE ACTIVITIES.    I HAVE NO PROBLEMS DOING MY USUAL ACTIVITIES [x]   I HAVE SLIGHT PROBLEMS DOING MY USUAL ACTIVITIES []   I HAVE MODERATE PROBLEMS DOING MY USUAL ACTIVIITIES []   I HAVE SEVERE PROBLEMS DOING MY USUAL ACTIVITIES []   I AM UNABLE TO DO MY USUAL ACTIVITIES []     PAIN /DISCOMFORT   I HAVE NO PAIN OR DISCOMFORT [x]   I HAVE SLIGHT PAIN OR DISCOMFORT []   I HAVE MODERATE PAIN OR DISCOMFORT []   I HAVE SEVERE PAIN OR DISCOMFORT []   I HAVE EXTREME PAIN OR DISCOMFORT []     ANXIETY/DEPRESSION   I AM NOT ANXIOUS OR DEPRESSED [x]   I AM SLIGHTLY ANXIOUS OR DEPRESSED []   I AM MODERATELY ANXIOUS OR DREPRESSED []   I AM SEVERELY ANXIOUS OR DEPRESSED []   I AM EXTREMELY ANXIOUS OR DEPRESSED []     SCALE OF 0-100 HOW WOULD YOU RATE TODAY?  0 IS THE WORSE AND 100 IS THE BEST HEALTH YOU CAN IMAGINE: 85     Current Outpatient Medications:  .  atorvastatin (LIPITOR) 10 MG tablet, TAKE 1 TABLET AT BEDTIME, Disp: 90 tablet, Rfl: 2 .  Calcium-Vitamin D (CALTRATE 600 PLUS-VIT D PO), Take 1 tablet by mouth at bedtime. , Disp: , Rfl:  .  docusate sodium (COLACE) 100 MG capsule, Take 1 capsule (100 mg total) by mouth daily as needed for mild constipation., Disp: 30 capsule, Rfl: 6 .   folic acid (FOLVITE) 1 MG tablet, Take 1 mg by mouth daily. , Disp: , Rfl:  .  furosemide (LASIX) 40 MG tablet, Take 2 tablets (80 mg total) by mouth every morning., Disp: 180 tablet, Rfl: 3 .  guaiFENesin (MUCINEX) 600 MG 12 hr tablet, Take 600-1,200 mg by mouth 2 (two) times daily as needed (cold/congestion.)., Disp: , Rfl:  .  methotrexate (RHEUMATREX) 2.5 MG tablet, Take 10 mg by mouth once a week. Caution:Chemotherapy. Protect from light., Disp: , Rfl:  .  metolazone (ZAROXOLYN) 2.5 MG tablet, Take 1 tablet (2.5 mg total) by mouth 2 (two) times a week. Take on Mondays and Thursdays., Disp: 30 tablet, Rfl: 3 .  Multiple Vitamin (MULTIVITAMIN WITH MINERALS) TABS tablet, Take 1 tablet by mouth daily., Disp: , Rfl:  .  nitroGLYCERIN (NITROSTAT) 0.4 MG SL tablet, DISSOLVE 1 TABLET UNDER THE TONGUE EVERY 5 MINUTES AS NEEDED FOR CHEST PAIN, Disp: 25 tablet, Rfl: 4 .  potassium chloride SA (K-DUR,KLOR-CON) 20 MEQ tablet, Take 2 tablets (40 mEq total) by mouth every morning., Disp: 180 tablet, Rfl: 3 .  Probiotic Product (HEALTHY COLON PO), Take 1 capsule by mouth daily., Disp: , Rfl:  .  tamsulosin (FLOMAX) 0.4 MG  CAPS capsule, Take 1 capsule (0.4 mg total) by mouth every evening., Disp: 90 capsule, Rfl: 3 .  TOPROL XL 25 MG 24 hr tablet, TAKE ONE-HALF TABLET (12.5 MG TOTAL) DAILY WITH OR IMMEDIATELY FOLLOWING A MEAL (DOSE CHANGE), Disp: 45 tablet, Rfl: 2 .  warfarin (COUMADIN) 5 MG tablet, TAKE 1 TABLET DAILY EXCEPT TAKE ONE AND ONE-HALF TABLETS ON MONDAY, WEDNESDAY, AND FRIDAY OR TAKE AS DIRECTED BY ANTICOAGULATION CLINIC. (Patient taking differently: Take 5-7.5 mg by mouth daily. Take 1 1/2 tablets (7.5 mg) by mouth on Monday, Wednesday, Friday, take 1 tablet (5 mg) on Sunday, Tuesday, Thursday, Saturday - or as directed by anticoagulation clinic), Disp: 120 tablet, Rfl: 1

## 2019-10-18 ENCOUNTER — Telehealth: Payer: Self-pay | Admitting: *Deleted

## 2019-10-18 NOTE — Telephone Encounter (Signed)
Daughter Erline Hau called - would like to come to father's next MD appt if possible and if not, would like to be called during appt. She states her father is not remembering things these days.  Advised her that at this time one visitor is allowed for MD visit at [facility], but that changes are anticipated. She verbalized understanding. Note added to appt w/her # [phone] (cell)

## 2019-10-19 ENCOUNTER — Encounter: Payer: Self-pay | Admitting: *Deleted

## 2019-10-19 ENCOUNTER — Other Ambulatory Visit: Payer: Self-pay | Admitting: *Deleted

## 2019-10-19 NOTE — Patient Outreach (Signed)
[facility]) Care Management  [place]  10/19/2019   [name] [name] August 20, 1925 [id number]   [place] Monthly Outreach  Referral Date:  01/17/2019 Referral Source:  Transfer from [facility] Reason for Referral:  Continued Disease Management Education Insurance:  Medicare   Outreach Attempt:  Successful telephone outreach to patient for follow up. HIPAA verified with patient.  Patient reporting his wife is not doing well and they are in the process of making life changing decisions for her.  States she has a history of dementia and has been residing in a Memory Care Unit and he has not physically seen her since February due to the pandemic.  Plans are for his daughters to take him to visit her tomorrow at the hospital.  Denies any shortness of breath himself but does report he continues to have lower extremity edema.  Reports fall the first week of October and denies any falls since.  Fall precautions and preventions reviewed and discussed.  Encouraged to use walker with all ambulation.  Encounter Medications:  Outpatient Encounter Medications as of 10/19/2019  Medication Sig Note  . atorvastatin (LIPITOR) 10 MG tablet TAKE 1 TABLET AT BEDTIME   . Calcium-Vitamin D (CALTRATE 600 PLUS-VIT D PO) Take 1 tablet by mouth at bedtime.    . docusate sodium (COLACE) 100 MG capsule Take 1 capsule (100 mg total) by mouth daily as needed for mild constipation.   . folic acid (FOLVITE) 1 MG tablet Take 1 mg by mouth daily.    . furosemide (LASIX) 40 MG tablet Take 2 tablets (80 mg total) by mouth every morning.   Marland Kitchen guaiFENesin (MUCINEX) 600 MG 12 hr tablet Take 600-1,200 mg by mouth 2 (two) times daily as needed (cold/congestion.).   Marland Kitchen methotrexate (RHEUMATREX) 2.5 MG tablet Take 10 mg by mouth once a week. Caution:Chemotherapy. Protect from light.   . metolazone (ZAROXOLYN) 2.5 MG tablet Take 1 tablet (2.5 mg total) by mouth 2 (two) times a week. Take on  Mondays and Thursdays.   . Multiple Vitamin (MULTIVITAMIN WITH MINERALS) TABS tablet Take 1 tablet by mouth daily.   . nitroGLYCERIN (NITROSTAT) 0.4 MG SL tablet DISSOLVE 1 TABLET UNDER THE TONGUE EVERY 5 MINUTES AS NEEDED FOR CHEST PAIN   . potassium chloride SA (K-DUR,KLOR-CON) 20 MEQ tablet Take 2 tablets (40 mEq total) by mouth every morning.   . Probiotic Product (HEALTHY COLON PO) Take 1 capsule by mouth daily.   . tamsulosin (FLOMAX) 0.4 MG CAPS capsule Take 1 capsule (0.4 mg total) by mouth every evening.   . TOPROL XL 25 MG 24 hr tablet TAKE ONE-HALF TABLET (12.5 MG TOTAL) DAILY WITH OR IMMEDIATELY FOLLOWING A MEAL (DOSE CHANGE)   . warfarin (COUMADIN) 5 MG tablet TAKE 1 TABLET DAILY EXCEPT TAKE ONE AND ONE-HALF TABLETS ON MONDAY, WEDNESDAY, AND FRIDAY OR TAKE AS DIRECTED BY ANTICOAGULATION CLINIC. (Patient taking differently: Take 5-7.5 mg by mouth daily. Take 1 1/2 tablets (7.5 mg) by mouth on Monday, Wednesday, Friday, take 1 tablet (5 mg) on Sunday, Tuesday, Thursday, Saturday - or as directed by anticoagulation clinic) 08/28/2019: Directions are per anti-coag visit 06/08/19  . [DISCONTINUED] diphenhydrAMINE (BENADRYL) 25 MG tablet Take 25 mg by mouth every 6 (six) hours as needed. For itching    No facility-administered encounter medications on file as of 10/19/2019.     Functional Status:  In your present state of health, do you have any difficulty performing the following activities: 02/08/2019 10/19/2018  Hearing? N N  Vision? Y N  Comment Patient states that he is going soon but has not made an appointment.  He will call. -  Difficulty concentrating or making decisions? N N  Walking or climbing stairs? Y Y  Dressing or bathing? Y N  Doing errands, shopping? Y Y  Comment he does n ot drive daughters  drive him -  Conservation officer, nature and eating ? - N  Using the Toilet? - Y  In the past six months, have you accidently leaked urine? - Y  Do you have problems with loss of bowel  control? - Y  Managing your Medications? - Y  Managing your Finances? - Y  Housekeeping or managing your Housekeeping? - Y  Some recent data might be hidden    Fall/Depression Screening: Fall Risk  10/19/2019 06/15/2019 04/12/2019  Falls in the past year? 1 0 0  Comment - - -  Number falls in past yr: 0 - -  Injury with Fall? 0 - -  Risk for fall due to : History of fall(s);Medication side effect;Impaired balance/gait;Impaired mobility;Impaired vision - -  Follow up Falls evaluation completed;Education provided;Falls prevention discussed - -   PHQ 2/9 Scores 02/08/2019 10/19/2018 12/18/2017 05/21/2017 05/18/2015  PHQ - 2 Score 0 0 0 0 0   THN CM Care Plan Problem One     Most Recent Value  Care Plan Problem One  Knowledge deficiet related to self care management of heart failure  Role Documenting the Problem One  [place] for Problem One  Active  THN Long Term Goal   Patient and family will verbalize no emergency room visits or unplanned hospitalizations within the next 90 days.  THN Long Term Goal Start Date  10/19/19  THN Long Term Goal Met Date  10/19/19  Interventions for Problem One Long Term Goal  Care plan and goals reviewed and discussed with patient, reviewed signs and symptoms of heart failure and encouraged daily weight monitoring, offered patient sympathy and support for his wife not doing well at this time, encouraged medication compliance, fall precautions and preventions reviewed and discussed     Appointments:  Patient reports [name] NP is his primary care provider and he is unsure of his last appointment stating "it's been a long time ago".  Encouraged patient to contact provider for appointment.  Has attended appointment with Cardiologist on 09/19/2019 and has follow up on 12/14/2019.  Has scheduled appointment with Oncologist, Dr. Irene Limbo on 11/02/2019.  Plan: RN Health Coach will send primary care quarterly update.  RN Health Coach will make next  telephone outreach to patient within the month of January and patient agrees to follow up outreach.  [place] [phone] [email or web address]

## 2019-10-21 ENCOUNTER — Ambulatory Visit: Payer: Medicare Other

## 2019-10-21 NOTE — Research (Signed)
Late Entry:  03/16/2019  Spoke with patient he is doing well, no complaints of chest pains or sob. Visit conducted by phone due to COVID-19 pandemic.  VKA checked today at PCP along with labs drawn.  Patient don't drive depends on family to take to appt. No changes per patient.  Labs in [place] and reviewed by Dr Burt Knack.   No AE or SAE to report at this time.   Will follow up with patient in Oct for 92M visit.

## 2019-10-24 NOTE — Progress Notes (Signed)
Labs in acceptable range. Not clinically significant.

## 2019-10-28 ENCOUNTER — Encounter: Payer: Self-pay | Admitting: Podiatry

## 2019-10-28 ENCOUNTER — Ambulatory Visit (INDEPENDENT_AMBULATORY_CARE_PROVIDER_SITE_OTHER): Payer: Medicare Other | Admitting: Podiatry

## 2019-10-28 ENCOUNTER — Other Ambulatory Visit: Payer: Self-pay

## 2019-10-28 DIAGNOSIS — B351 Tinea unguium: Secondary | ICD-10-CM | POA: Diagnosis not present

## 2019-10-28 DIAGNOSIS — M79676 Pain in unspecified toe(s): Secondary | ICD-10-CM | POA: Diagnosis not present

## 2019-10-28 DIAGNOSIS — D689 Coagulation defect, unspecified: Secondary | ICD-10-CM

## 2019-10-28 DIAGNOSIS — R609 Edema, unspecified: Secondary | ICD-10-CM

## 2019-10-28 NOTE — Progress Notes (Signed)
Patient ID: [name] [name], male   DOB: Oct 24, 1925, 85 y.o.   MRN: [id number] HPI  Complaint:  Visit Type: Patient returns to my office for continued preventative foot care services. Complaint: Patient states" my nails have grown long and thick and become painful to walk and wear shoes" . He presents for preventative foot care services. No changes to ROS.  Patients feet look uncared for due to severe scaling both feet/legs.  He also has area of white discoloration on heel right foot and forefoot left foot.  Podiatric Exam: Vascular: dorsalis pedis and posterior tibial pulses are negative.due to excessive swelling. In feet/legs. Capillary return is immediate. Temperature gradient is negative. Skin turgor WNL,   Sensorium: Normal Semmes Weinstein monofilament test. Normal tactile sensation bilaterally.  Nail Exam: Pt has thick disfigured discolored nails with subungual debris  entire nail hallux through fifth toenails both feet. Ulcer Exam: There is no evidence of ulcer or pre-ulcerative changes or infection. Orthopedic Exam: Muscle tone and strength are WNL. No limitations in general ROM. No crepitus or effusions noted. Foot type and digits show no abnormalities. Bony prominences are unremarkable. Amputation third toe right foot. Skin: No Porokeratosis. No infection or ulcers. Severe scaling dorsal and plantar aspects of both feet.  White mascerated area right heel and two forefoot white areas.  Diagnosis:  Onychomycosis  B/L. Pain in right toe, pain in left toes,   Treatment & Plan Procedures and Treatment: Consent by patient was obtained for treatment procedures. The patient understood the discussion of treatment and procedures well. All questions were answered thoroughly reviewed. Debridement of mycotic and hypertrophic toenails x 9  and clearing of subungual debris. No ulceration, no infection noted. Told patient to check with his dermatologist.  Told him he needs his daughter to help with his  foot care. Return Visit-Office Procedure: Patient instructed to return to the office for a follow up visit 10 weeks for continued evaluation and treatment.  Gardiner Barefoot DPM

## 2019-10-29 ENCOUNTER — Other Ambulatory Visit: Payer: Self-pay | Admitting: Physician Assistant

## 2019-10-31 MED ORDER — POTASSIUM CHLORIDE CRYS ER 20 MEQ PO TBCR: 40.0000 meq | EXTENDED_RELEASE_TABLET | Freq: Every morning | ORAL | 3 refills | Status: DC

## 2019-11-02 ENCOUNTER — Inpatient Hospital Stay: Payer: Medicare Other

## 2019-11-02 ENCOUNTER — Other Ambulatory Visit: Payer: Self-pay

## 2019-11-02 ENCOUNTER — Inpatient Hospital Stay: Payer: Medicare Other | Attending: Hematology | Admitting: Hematology

## 2019-11-02 VITALS — BP 129/85 | HR 72 | Temp 98.8°F | Resp 18 | Ht 71.5 in | Wt 191.1 lb

## 2019-11-02 DIAGNOSIS — C439 Malignant melanoma of skin, unspecified: Secondary | ICD-10-CM | POA: Insufficient documentation

## 2019-11-02 DIAGNOSIS — M899 Disorder of bone, unspecified: Secondary | ICD-10-CM

## 2019-11-02 DIAGNOSIS — I4891 Unspecified atrial fibrillation: Secondary | ICD-10-CM | POA: Insufficient documentation

## 2019-11-02 DIAGNOSIS — B20 Human immunodeficiency virus [HIV] disease: Secondary | ICD-10-CM | POA: Diagnosis not present

## 2019-11-02 DIAGNOSIS — D490 Neoplasm of unspecified behavior of digestive system: Secondary | ICD-10-CM | POA: Diagnosis not present

## 2019-11-02 DIAGNOSIS — E039 Hypothyroidism, unspecified: Secondary | ICD-10-CM | POA: Diagnosis not present

## 2019-11-02 DIAGNOSIS — I10 Essential (primary) hypertension: Secondary | ICD-10-CM | POA: Diagnosis not present

## 2019-11-02 DIAGNOSIS — C7951 Secondary malignant neoplasm of bone: Secondary | ICD-10-CM | POA: Diagnosis not present

## 2019-11-02 DIAGNOSIS — C4359 Malignant melanoma of other part of trunk: Secondary | ICD-10-CM | POA: Diagnosis not present

## 2019-11-02 DIAGNOSIS — D649 Anemia, unspecified: Secondary | ICD-10-CM | POA: Diagnosis present

## 2019-11-02 DIAGNOSIS — M069 Rheumatoid arthritis, unspecified: Secondary | ICD-10-CM | POA: Insufficient documentation

## 2019-11-02 DIAGNOSIS — I2510 Atherosclerotic heart disease of native coronary artery without angina pectoris: Secondary | ICD-10-CM | POA: Diagnosis not present

## 2019-11-02 LAB — CBC WITH DIFFERENTIAL (CANCER CENTER ONLY)
Abs Immature Granulocytes: 0.02 10*3/uL (ref 0.00–0.07)
Basophils Absolute: 0 10*3/uL (ref 0.0–0.1)
Basophils Relative: 1 %
Eosinophils Absolute: 0.3 10*3/uL (ref 0.0–0.5)
Eosinophils Relative: 5 %
HCT: 38.7 % — ABNORMAL LOW (ref 39.0–52.0)
Hemoglobin: 12.7 g/dL — ABNORMAL LOW (ref 13.0–17.0)
Immature Granulocytes: 0 %
Lymphocytes Relative: 14 %
Lymphs Abs: 0.9 10*3/uL (ref 0.7–4.0)
MCH: 31.7 pg (ref 26.0–34.0)
MCHC: 32.8 g/dL (ref 30.0–36.0)
MCV: 96.5 fL (ref 80.0–100.0)
Monocytes Absolute: 0.7 10*3/uL (ref 0.1–1.0)
Monocytes Relative: 11 %
Neutro Abs: 4.6 10*3/uL (ref 1.7–7.7)
Neutrophils Relative %: 69 %
Platelet Count: 171 10*3/uL (ref 150–400)
RBC: 4.01 MIL/uL — ABNORMAL LOW (ref 4.22–5.81)
RDW: 13.9 % (ref 11.5–15.5)
WBC Count: 6.6 10*3/uL (ref 4.0–10.5)
nRBC: 0 % (ref 0.0–0.2)

## 2019-11-02 LAB — CMP (CANCER CENTER ONLY)
ALT: 11 U/L (ref 0–44)
AST: 22 U/L (ref 15–41)
Albumin: 3.5 g/dL (ref 3.5–5.0)
Alkaline Phosphatase: 55 U/L (ref 38–126)
Anion gap: 8 (ref 5–15)
BUN: 18 mg/dL (ref 8–23)
CO2: 33 mmol/L — ABNORMAL HIGH (ref 22–32)
Calcium: 8.9 mg/dL (ref 8.9–10.3)
Chloride: 99 mmol/L (ref 98–111)
Creatinine: 1.19 mg/dL (ref 0.61–1.24)
GFR, Est AFR Am: >60 mL/min (ref >60)
GFR, Estimated: 53 mL/min — ABNORMAL LOW (ref >60)
Glucose, Bld: 85 mg/dL (ref 70–99)
Potassium: 3.5 mmol/L (ref 3.5–5.1)
Sodium: 140 mmol/L (ref 135–145)
Total Bilirubin: 0.4 mg/dL (ref 0.3–1.2)
Total Protein: 6.7 g/dL (ref 6.5–8.1)

## 2019-11-02 LAB — LACTATE DEHYDROGENASE: LDH: 241 U/L — ABNORMAL HIGH (ref 98–192)

## 2019-11-02 NOTE — Progress Notes (Signed)
[facility]  HEMATOLOGY ONCOLOGY PROGRESS NOTE  Date of service: 04/08/18  Patient Care Team: [name], NP as PCP - General (Family Medicine) Dorothy Spark, MD as PCP - Cardiology (Cardiology) Fanny Skates, MD as Consulting Physician (General Surgery) Leona Singleton, RN as [place] Management  CC: f/u for Anemia   INTERVAL HISTORY:  [name] is here for followup his multifactorial anemia. We are joined today by his son. The patient's last visit with [place] was on 08/22/2019. The pt reports that he is doing well overall.  The pt reports that he has seen [facility] and they decided not to move forward with a wide local excision. Pt was told that they would not be able to do so with local anesthesia. Dr. Meda Coffee did not approve of the surgery, as she was concerned the pt being under general anesthesia. Pt has not set up a consult with an ENT physician. Pt has an upcoming appointment with his Dermatologist on Monday. He has a growing skin lesion on his forehead, which the Dermatologist has frozen before but there has not been a biopsy taken yet. He denies any new/different back pain or bone pains.   Of note since the patient's last visit, pt has had PET/CT scan ([id number]) completed on 09/05/2019 with results revealing "1. Focus of FDG accumulation in the skin of the right flank region, the level of the posterior right eleventh rib. This could be related to the patient's primary lesion or granulation from biopsy. 2. Extensive edema noted in the lower extremities bilaterally with areas of focal hypermetabolic uptake identified in each leg, as above. Findings may be related to infection/inflammation, but cutaneous/subcutaneous neoplasm not excluded. 3. Focal hypermetabolism in the left oropharynx. Primary mucosal lesion not excluded. Direct visualization recommended to further evaluate as primary neoplasm not excluded. 4. Several hypermetabolic bone  lesions without underlying abnormality visible on CT imaging. Appearance raises concern for metastatic disease. One of the most prominent lesions is in the L4 vertebral body. Lumbar spine MRI without and with contrast may prove helpful to further evaluate. 5.  Aortic Atherosclerois (ICD10-170.0)."  Lab results today (11/02/19) of CBC w/diff and CMP is as follows: all values are WNL except for RBC at 4.01, Hgb at 12.7, HCT at 38.7, CO2 at 33, GFR Est Non Af Am at 53. 11/02/2019 LDH at 241 11/02/2019 K/L light chains are in progress 11/02/2019 MMP is in progress  On review of systems, pt denies new back pain, new bone pain and any other symptoms.    REVIEW OF SYSTEMS:   A 10+ POINT REVIEW OF SYSTEMS WAS OBTAINED including neurology, dermatology, psychiatry, cardiac, respiratory, lymph, extremities, GI, GU, Musculoskeletal, constitutional, breasts, reproductive, HEENT.  All pertinent positives are noted in the HPI.  All others are negative.  . Past Medical History:  Diagnosis Date  . Atrial fibrillation, chronic (HCC)    a. on coumadin   . Carotid artery occlusion    right total internal artery occlusion  . Chronic diastolic CHF (congestive heart failure) ([place])   . Coronary artery disease    s/p cardiac cath in 1990s, and cardiolite study  in 2005 showing EF 54%  . Degenerative disc disease    cervical spine  . GERD (gastroesophageal reflux disease)   . HIV infection ([place])   . Hyperlipidemia   . Hypertension   . Hypothyroidism   . Rheumatoid arthritis ([place])   . S/P TAVR (transcatheter aortic valve replacement)    Oletta Lamas  Sapien 3 THV (size 29 mm) via the R TF approach   . Skin cancer    forehead  . Stroke [facility])    TIA   . TIA (transient ischemic attack)   . Urgency incontinence     . Past Surgical History:  Procedure Laterality Date  . AMPUTATION Right 02/21/2015   Procedure: Right 3rd Ray Amputation;  Surgeon: Newt Minion, MD;  Location: [facility];  Service: Orthopedics;   Laterality: Right;  Digital block and ankle block with MAC  . CARDIAC CATHETERIZATION  1990s  . CATARACT EXTRACTION W/ INTRAOCULAR LENS  IMPLANT, BILATERAL    . EYE SURGERY     cataracts bilateral  . INTRAOPERATIVE TRANSTHORACIC ECHOCARDIOGRAM  09/14/2018   Procedure: INTRAOPERATIVE TRANSTHORACIC ECHOCARDIOGRAM;  Surgeon: Sherren Mocha, MD;  Location: [facility];  Service: Open Heart Surgery;;  . RIGHT/LEFT HEART CATH AND CORONARY ANGIOGRAPHY N/A 07/21/2018   Procedure: RIGHT/LEFT HEART CATH AND CORONARY ANGIOGRAPHY;  Surgeon: Sherren Mocha, MD;  Location: [facility];  Service: Cardiovascular;  Laterality: N/A;  . SKIN CANCER EXCISION  12/2011   forehead  . TRANSCATHETER AORTIC VALVE REPLACEMENT, TRANSFEMORAL N/A 09/14/2018   Procedure: TRANSCATHETER AORTIC VALVE REPLACEMENT, TRANSFEMORAL. 29m EDWARDS SAPIEN3 THV.;  Surgeon: CSherren Mocha MD;  [place]  Service: Open Heart Surgery;  Laterality: N/A;    . Social History   Tobacco Use  . Smoking status: Former Smoker    Packs/day: 1.00    Years: 40.00    Pack years: 40.00    Types: Cigarettes    Quit date: 09/12/1989    Years since quitting: 30.1  . Smokeless tobacco: Never Used  . Tobacco comment: quit around 1992  Substance Use Topics  . Alcohol use: No    Alcohol/week: 0.0 standard drinks    Comment: former, quit drinking in 1992  . Drug use: No    ALLERGIES:  is allergic to levofloxacin and klor-con [potassium chloride er].  MEDICATIONS:  Current Outpatient Medications  Medication Sig Dispense Refill  . atorvastatin (LIPITOR) 10 MG tablet TAKE 1 TABLET AT BEDTIME 90 tablet 2  . Calcium-Vitamin D (CALTRATE 600 PLUS-VIT D PO) Take 1 tablet by mouth at bedtime.     . docusate sodium (COLACE) 100 MG capsule Take 1 capsule (100 mg total) by mouth daily as needed for mild constipation. 30 capsule 6  . folic acid (FOLVITE) 1 MG tablet Take 1 mg by mouth daily.     . furosemide (LASIX) 40 MG tablet TAKE 2  TABLETS EVERY MORNING 180 tablet 3  . guaiFENesin (MUCINEX) 600 MG 12 hr tablet Take 600-1,200 mg by mouth 2 (two) times daily as needed (cold/congestion.).    .Marland Kitchenmethotrexate (RHEUMATREX) 2.5 MG tablet Take 10 mg by mouth once a week. Caution:Chemotherapy. Protect from light.    . metolazone (ZAROXOLYN) 2.5 MG tablet Take 1 tablet (2.5 mg total) by mouth 2 (two) times a week. Take on Mondays and Thursdays. 30 tablet 3  . Multiple Vitamin (MULTIVITAMIN WITH MINERALS) TABS tablet Take 1 tablet by mouth daily.    . nitroGLYCERIN (NITROSTAT) 0.4 MG SL tablet DISSOLVE 1 TABLET UNDER THE TONGUE EVERY 5 MINUTES AS NEEDED FOR CHEST PAIN 25 tablet 4  . potassium chloride SA (KLOR-CON) 20 MEQ tablet Take 2 tablets (40 mEq total) by mouth every morning. 180 tablet 3  . Probiotic Product (HEALTHY COLON PO) Take 1 capsule by mouth daily.    . tamsulosin (FLOMAX) 0.4 MG CAPS capsule Take 1 capsule (  0.4 mg total) by mouth every evening. 90 capsule 3  . TOPROL XL 25 MG 24 hr tablet TAKE ONE-HALF TABLET (12.5 MG TOTAL) DAILY WITH OR IMMEDIATELY FOLLOWING A MEAL (DOSE CHANGE) 45 tablet 2  . warfarin (COUMADIN) 5 MG tablet TAKE 1 TABLET DAILY EXCEPT TAKE ONE AND ONE-HALF TABLETS ON MONDAY, WEDNESDAY, AND FRIDAY OR TAKE AS DIRECTED BY ANTICOAGULATION CLINIC. (Patient taking differently: Take 5-7.5 mg by mouth daily. Take 1 1/2 tablets (7.5 mg) by mouth on Monday, Wednesday, Friday, take 1 tablet (5 mg) on Sunday, Tuesday, Thursday, Saturday - or as directed by anticoagulation clinic) 120 tablet 1   No current facility-administered medications for this visit.     PHYSICAL EXAMINATION:  ECOG PERFORMANCE STATUS: 1 - Symptomatic but completely ambulatory  Vitals:   11/02/19 1509  BP: 129/85  Pulse: 72  Resp: 18  Temp: 98.8 F (37.1 C)  SpO2: 98%   Filed Weights   11/02/19 1509  Weight: 191 lb 1.6 oz (86.7 kg)   Body mass index is 26.28 kg/m.   Exam was given in a wheelchair   GENERAL:alert, in no acute  distress and comfortable SKIN: no acute rashes, no significant lesions  EYES: conjunctiva are pink and non-injected, sclera anicteric OROPHARYNX: MMM, no exudates, no oropharyngeal erythema or ulceration NECK: supple, no JVD LYMPH:  no palpable lymphadenopathy in the cervical, axillary or inguinal regions LUNGS: clear to auscultation b/l with normal respiratory effort HEART: regular rate & rhythm ABDOMEN:  normoactive bowel sounds , non tender, not distended. No palpable hepatosplenomegaly.  Extremity: 2+ pedal edema PSYCH: alert & oriented x 3 with fluent speech NEURO: no focal motor/sensory deficits  LABORATORY DATA:    CBC Latest Ref Rng & Units 11/02/2019 09/19/2019 08/28/2019  WBC 4.0 - 10.5 K/uL 6.6 7.4 8.8  Hemoglobin 13.0 - 17.0 g/dL 12.7(L) 12.8(L) 12.6(L)  Hematocrit 39.0 - 52.0 % 38.7(L) 38.1 38.4(L)  Platelets 150 - 400 K/uL 171 211 164   . CBC    Component Value Date/Time   WBC 6.6 11/02/2019 1447   WBC 8.8 08/28/2019 1433   RBC 4.01 (L) 11/02/2019 1447   HGB 12.7 (L) 11/02/2019 1447   HGB 12.8 (L) 09/19/2019 1554   HGB 11.0 (L) 05/05/2017 1437   HCT 38.7 (L) 11/02/2019 1447   HCT 38.1 09/19/2019 1554   HCT 33.7 (L) 05/05/2017 1437   PLT 171 11/02/2019 1447   PLT 211 09/19/2019 1554   MCV 96.5 11/02/2019 1447   MCV 95 09/19/2019 1554   MCV 96.8 05/05/2017 1437   MCH 31.7 11/02/2019 1447   MCHC 32.8 11/02/2019 1447   RDW 13.9 11/02/2019 1447   RDW 12.5 09/19/2019 1554   RDW 15.3 (H) 05/05/2017 1437   LYMPHSABS 0.9 11/02/2019 1447   LYMPHSABS 0.9 09/19/2019 1554   LYMPHSABS 1.0 05/05/2017 1437   MONOABS 0.7 11/02/2019 1447   MONOABS 0.4 05/05/2017 1437   EOSABS 0.3 11/02/2019 1447   EOSABS 0.2 09/19/2019 1554   BASOSABS 0.0 11/02/2019 1447   BASOSABS 0.1 09/19/2019 1554   BASOSABS 0.0 05/05/2017 1437    . CMP Latest Ref Rng & Units 11/02/2019 09/19/2019 08/28/2019  Glucose 70 - 99 mg/dL 85 101(H) 125(H)  BUN 8 - 23 mg/dL 18 19 27(H)  Creatinine 0.61  - 1.24 mg/dL 1.19 1.24 1.55(H)  Sodium 135 - 145 mmol/L 140 141 136  Potassium 3.5 - 5.1 mmol/L 3.5 3.5 3.9  Chloride 98 - 111 mmol/L 99 99 98  CO2 22 - 32  mmol/L 33(H) 26 28  Calcium 8.9 - 10.3 mg/dL 8.9 9.4 8.9  Total Protein 6.5 - 8.1 g/dL 6.7 6.4 -  Total Bilirubin 0.3 - 1.2 mg/dL 0.4 0.3 -  Alkaline Phos 38 - 126 U/L 55 63 -  AST 15 - 41 U/L 22 21 -  ALT 0 - 44 U/L 11 9 -   . Lab Results  Component Value Date   IRON 81 08/22/2019   TIBC 250 08/22/2019   IRONPCTSAT 32 08/22/2019   (Iron and TIBC)  Lab Results  Component Value Date   FERRITIN 126 08/22/2019     RADIOGRAPHIC STUDIES: I have personally reviewed the radiological images as listed and agreed with the findings in the report. No results found.  ASSESSMENT & PLAN:   85 y.o. male with   1) Multifactorial Normocytic Anemia. This appears to be primarily related to anemia of chronic inflammation related to his rheumatoid arthritis along with perhaps some element of functional iron deficiency. It was also partially related to his methotrexate which he is off of since November 2017 but has been put back on. . Lab Results  Component Value Date   IRON 81 08/22/2019   TIBC 250 08/22/2019   IRONPCTSAT 32 08/22/2019   (Iron and TIBC)  Lab Results  Component Value Date   FERRITIN 126 08/22/2019    Cannot rule out an element of low level MDS. Monitoring for GI bleeding on anticoagulation.  PLAN:  -Discussed 11/02/2019 RBC at 4.01, Hgb at 12.7, HCT at 38.7 -Advised that we would like to keep iron high due to pt's RA; Goal Ferritin >100 -No indication for additional IV Iron at this time.  2) newly diagnosed pT3b NX MX malignant melanoma of his right lower back. PLAN: -Discussed pt labwork today, 11/02/19; blood counts and chemistries look good, LDH is high  -Discussed 11/02/2019 K/L light chains are in progress -Discussed 11/02/2019 MMP is in progress -Discussed 09/05/2019 PET/CT scan ([id number]) which  revealed "1. Focus of FDG accumulation in the skin of the right flank region, the level of the posterior right eleventh rib. This could be related to the patient's primary lesion or granulation from biopsy. 2. Extensive edema noted in the lower extremities bilaterally with areas of focal hypermetabolic uptake identified in each leg, as above. Findings may be related to infection/inflammation, but cutaneous/subcutaneous neoplasm not excluded. 3. Focal hypermetabolism in the left oropharynx. Primary mucosal lesion not excluded. Direct visualization recommended to further evaluate as primary neoplasm not excluded. 4. Several hypermetabolic bone lesions without underlying abnormality visible on CT imaging. Appearance raises concern for metastatic disease. One of the most prominent lesions is in the L4 vertebral body. Lumbar spine MRI without and with contrast may prove helpful to further evaluate. 5.  Aortic Atherosclerois (ICD10-170.0)." -Advised pt that Melanoma typically metastasizes to nearest lymph nodes - not what was found on PET/CT -Discussed the possibility of an incidental finding of Multiple Myeloma -Will hold off on BM Bx at this time -Will refer to ENT for FGD avid left tonsillar lesion -Will get a rpt PET/CT scan in 3 months -Will see back in 14 weeks with labs   FOLLOW UP: ENT referral for FDG avid left tonsillar lesion PET/CT in 3 months with labs MD visit in 14 weeks  The total time spent in the appt was 25 minutes and more than 50% was on counseling and direct patient cares.  All of the patient's questions were answered with apparent satisfaction. The patient knows to call  the clinic with any problems, questions or concerns.    Sullivan Lone MD [place] AAHIVMS [facility] Hematology/Oncology Physician [facility]  (Office):       [phone] (Work cell):  [phone] (Fax):           [phone]  I, Yevette Edwards, am acting as a scribe for Dr. Sullivan Lone.   .I have  reviewed the above documentation for accuracy and completeness, and I agree with the above. Brunetta Genera MD

## 2019-11-03 LAB — KAPPA/LAMBDA LIGHT CHAINS
Kappa free light chain: 42.1 mg/L — ABNORMAL HIGH (ref 3.3–19.4)
Kappa, lambda light chain ratio: 1.27 (ref 0.26–1.65)
Lambda free light chains: 33.1 mg/L — ABNORMAL HIGH (ref 5.7–26.3)

## 2019-11-06 LAB — MULTIPLE MYELOMA PANEL, SERUM
Albumin SerPl Elph-Mcnc: 3.4 g/dL (ref 2.9–4.4)
Albumin/Glob SerPl: 1.3 (ref 0.7–1.7)
Alpha 1: 0.2 g/dL (ref 0.0–0.4)
Alpha2 Glob SerPl Elph-Mcnc: 0.7 g/dL (ref 0.4–1.0)
B-Globulin SerPl Elph-Mcnc: 0.8 g/dL (ref 0.7–1.3)
Gamma Glob SerPl Elph-Mcnc: 1 g/dL (ref 0.4–1.8)
Globulin, Total: 2.8 g/dL (ref 2.2–3.9)
IgA: 270 mg/dL (ref 61–437)
IgG (Immunoglobin G), Serum: 1060 mg/dL (ref 603–1613)
IgM (Immunoglobulin M), Srm: 119 mg/dL (ref 15–143)
Total Protein ELP: 6.2 g/dL (ref 6.0–8.5)

## 2019-11-09 ENCOUNTER — Other Ambulatory Visit: Payer: Self-pay

## 2019-11-09 ENCOUNTER — Ambulatory Visit (INDEPENDENT_AMBULATORY_CARE_PROVIDER_SITE_OTHER): Payer: Medicare Other | Admitting: General Practice

## 2019-11-09 DIAGNOSIS — I4891 Unspecified atrial fibrillation: Secondary | ICD-10-CM

## 2019-11-09 DIAGNOSIS — Z7901 Long term (current) use of anticoagulants: Secondary | ICD-10-CM

## 2019-11-09 LAB — POCT INR: INR: 1.9 — AB (ref 2.0–3.0)

## 2019-11-09 NOTE — Progress Notes (Signed)
I have reviewed the results and agree with this plan   

## 2019-11-09 NOTE — Patient Instructions (Signed)
Pre visit review using our clinic review tool, if applicable. No additional management support is needed unless otherwise documented below in the visit note.  Take 2 tablets (10 mg) today and then continue to take 1 (5 mg) tablet daily except 1 1/2 tablets (7.5 mg) on Monday/Wed/Fridays.  Re-check in 5 to 6 weeks.  Dosing details left on [name]'s VM.   Call [name] for any scheduling issues - [phone].

## 2019-11-14 ENCOUNTER — Other Ambulatory Visit: Payer: Self-pay

## 2019-11-14 ENCOUNTER — Ambulatory Visit (INDEPENDENT_AMBULATORY_CARE_PROVIDER_SITE_OTHER): Payer: Medicare Other | Admitting: Otolaryngology

## 2019-11-14 ENCOUNTER — Other Ambulatory Visit (HOSPITAL_COMMUNITY)
Admission: RE | Admit: 2019-11-14 | Discharge: 2019-11-14 | Disposition: A | Discharge status: Home / Self Care | Payer: Medicare Other | Source: Ambulatory Visit | Attending: Otolaryngology | Admitting: Otolaryngology

## 2019-11-14 VITALS — Temp 97.9°F

## 2019-11-14 DIAGNOSIS — J358 Other chronic diseases of tonsils and adenoids: Secondary | ICD-10-CM

## 2019-11-14 DIAGNOSIS — I2510 Atherosclerotic heart disease of native coronary artery without angina pectoris: Secondary | ICD-10-CM | POA: Diagnosis not present

## 2019-11-14 NOTE — Progress Notes (Signed)
HPI: [name] [name] is a 85 y.o. male who presents is referred by Dr. Irene Limbo For evaluation of left oropharynx.  Patient apparently has had recent diagnosis of melanoma and underwent a PET scan.  This apparently showed asymmetric FDG hypermetabolism in the left oropharynx and asymmetry of soft tissue in the left tonsil region.  No hypermetabolic cervical lymphadenopathy identified.  Patient was referred here for evaluation of left tonsil.. I reviewed the PET scan in the office today.  Past Medical History:  Diagnosis Date  . Atrial fibrillation, chronic (HCC)    a. on coumadin   . Carotid artery occlusion    right total internal artery occlusion  . Chronic diastolic CHF (congestive heart failure) ([place])   . Coronary artery disease    s/p cardiac cath in 1990s, and cardiolite study  in 2005 showing EF 54%  . Degenerative disc disease    cervical spine  . GERD (gastroesophageal reflux disease)   . HIV infection ([place])   . Hyperlipidemia   . Hypertension   . Hypothyroidism   . Rheumatoid arthritis ([place])   . S/P TAVR (transcatheter aortic valve replacement)    Edwards Sapien 3 THV (size 29 mm) via the R TF approach   . Skin cancer    forehead  . Stroke [facility])    TIA   . TIA (transient ischemic attack)   . Urgency incontinence    Past Surgical History:  Procedure Laterality Date  . AMPUTATION Right 02/21/2015   Procedure: Right 3rd Ray Amputation;  Surgeon: Newt Minion, MD;  Location: [facility];  Service: Orthopedics;  Laterality: Right;  Digital block and ankle block with MAC  . CARDIAC CATHETERIZATION  1990s  . CATARACT EXTRACTION W/ INTRAOCULAR LENS  IMPLANT, BILATERAL    . EYE SURGERY     cataracts bilateral  . INTRAOPERATIVE TRANSTHORACIC ECHOCARDIOGRAM  09/14/2018   Procedure: INTRAOPERATIVE TRANSTHORACIC ECHOCARDIOGRAM;  Surgeon: Sherren Mocha, MD;  Location: [facility];  Service: Open Heart Surgery;;  . RIGHT/LEFT HEART CATH AND CORONARY ANGIOGRAPHY N/A 07/21/2018   Procedure:  RIGHT/LEFT HEART CATH AND CORONARY ANGIOGRAPHY;  Surgeon: Sherren Mocha, MD;  Location: [facility];  Service: Cardiovascular;  Laterality: N/A;  . SKIN CANCER EXCISION  12/2011   forehead  . TRANSCATHETER AORTIC VALVE REPLACEMENT, TRANSFEMORAL N/A 09/14/2018   Procedure: TRANSCATHETER AORTIC VALVE REPLACEMENT, TRANSFEMORAL. 52m EDWARDS SAPIEN3 THV.;  Surgeon: CSherren Mocha MD;  Location[place] [facility]  Service: Open Heart Surgery;  Laterality: N/A;   Social History   Socioeconomic History  . Marital status: Married    Spouse name: Not on file  . Number of children: Not on file  . Years of education: Not on file  . Highest education level: Not on file  Occupational History  . Not on file  Tobacco Use  . Smoking status: Former Smoker    Packs/day: 1.00    Years: 40.00    Pack years: 40.00    Types: Cigarettes    Quit date: 09/12/1989    Years since quitting: 30.1  . Smokeless tobacco: Never Used  . Tobacco comment: quit around 1992  Substance and Sexual Activity  . Alcohol use: No    Alcohol/week: 0.0 standard drinks    Comment: former, quit drinking in 1992  . Drug use: No  . Sexual activity: Not Currently  Other Topics Concern  . 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 Difficulty of Paying Living Expenses: Not hard at all  Food Insecurity: No Food Insecurity  . Worried About Charity fundraiser in the Last Year: Never true  . Ran Out of Food in the Last Year: Never true  Transportation Needs: No Transportation Needs  . Lack of Transportation (Medical): No  . Lack of Transportation (Non-Medical): No  Physical Activity:   . Days of Exercise per Week: Not on file  . Minutes of Exercise per Session: Not on file  Stress:   . Feeling of Stress :  Not on file  Social Connections:   . Frequency of Communication with Friends and Family: Not on file  . Frequency of Social Gatherings with Friends and Family: Not on file  . Attends Religious Services: Not on file  . Active Member of Clubs or Organizations: Not on file  . Attends Archivist Meetings: Not on file  . Marital Status: Not on file   Family History  Problem Relation Age of Onset  . Heart disease Mother   . Malignant hyperthermia Mother   . Hypertension Mother   . Heart disease Father   . Malignant hyperthermia Father   . Hyperlipidemia Father   . Hypertension Father   . Heart disease Sister   . Hypertension Sister    Allergies  Allergen Reactions  . Levofloxacin Rash    "thought they were sticking swords thru me"  . Klor-Con [Potassium Chloride Er] Diarrhea   Prior to Admission medications   Medication Sig Start Date End Date Taking? Authorizing Provider  atorvastatin (LIPITOR) 10 MG tablet TAKE 1 TABLET AT BEDTIME 04/19/19  Yes Dorothy Spark, MD  Calcium-Vitamin D (CALTRATE 600 PLUS-VIT D PO) Take 1 tablet by mouth at bedtime.    Yes [provider]  docusate sodium (COLACE) 100 MG capsule Take 1 capsule (100 mg total) by mouth daily as needed for mild constipation. 09/17/18  Yes Eileen Stanford, PA-C  folic acid (FOLVITE) 1 MG tablet Take 1 mg by mouth daily.  03/06/16  Yes [provider]  furosemide (LASIX) 40 MG tablet TAKE 2 TABLETS EVERY MORNING 10/31/19  Yes Eileen Stanford, PA-C  guaiFENesin (MUCINEX) 600 MG 12 hr tablet Take 600-1,200 mg by mouth 2 (two) times daily as needed (cold/congestion.).   Yes [provider]  methotrexate (RHEUMATREX) 2.5 MG tablet Take 10 mg by mouth once a week. Caution:Chemotherapy. Protect from light.   Yes [provider]  metolazone (ZAROXOLYN) 2.5 MG tablet Take 1 tablet (2.5 mg total) by mouth 2 (two) times a week. Take on Mondays and Thursdays. 06/06/19  Yes Dorothy Spark, MD  Multiple Vitamin (MULTIVITAMIN WITH MINERALS) TABS tablet Take 1 tablet by mouth daily.   Yes [provider]  nitroGLYCERIN (NITROSTAT) 0.4 MG SL tablet DISSOLVE 1 TABLET UNDER THE TONGUE EVERY 5 MINUTES AS NEEDED FOR CHEST PAIN 06/23/19  Yes Dorothy Spark, MD  potassium chloride SA (KLOR-CON) 20 MEQ tablet Take 2 tablets (40 mEq total) by mouth every morning. 10/31/19  Yes Eileen Stanford, PA-C  Probiotic Product (HEALTHY COLON PO) Take 1 capsule by mouth daily.   Yes [provider]  tamsulosin (FLOMAX) 0.4 MG CAPS capsule Take 1 capsule (0.4 mg total) by mouth every evening. 05/11/19  Yes Dorothy Spark, MD  TOPROL XL 25 MG 24 hr tablet TAKE ONE-HALF TABLET (12.5 MG TOTAL) DAILY WITH OR IMMEDIATELY FOLLOWING A MEAL (DOSE CHANGE) 05/30/19  Yes Dorothy Spark, MD  warfarin (COUMADIN) 5 MG tablet TAKE 1 TABLET DAILY EXCEPT TAKE ONE AND ONE-HALF TABLETS ON MONDAY, WEDNESDAY, AND FRIDAY OR TAKE AS DIRECTED BY ANTICOAGULATION CLINIC. Patient taking differently: Take 5-7.5 mg by mouth daily. Take 1 1/2 tablets (7.5 mg) by mouth on Monday, Wednesday, Friday, take 1 tablet (5 mg) on Sunday, Tuesday, Thursday, Saturday - or as directed by anticoagulation clinic 06/14/19  Yes Nafziger, Tommi Rumps, NP  diphenhydrAMINE (BENADRYL) 25 MG tablet Take 25 mg by mouth every 6 (six) hours as needed. For itching  06/06/19  [provider]     Positive ROS: Otherwise negative  All other systems have been reviewed and were otherwise negative with the exception of those mentioned in the HPI and as above.  Physical Exam: Constitutional: Alert, well-appearing, no acute distress Ears: External ears without lesions or tenderness. Ear canals are clear bilaterally with intact, clear TMs.  Nasal: External nose without lesions. Septum relatively midline.. Clear nasal passages.  No signs of infection. Oral: Lips and gums without lesions. Tongue and palate mucosa without  lesions. Posterior oropharynx clear.  On close evaluation of the tonsils.  The left tonsil is slightly more swollen and slightly indurated and firm on palpation..   On gross examination this appears to be more inflammatory than neoplastic but the area is indurated and firm to palpation.  A biopsy using just topical local anesthetic was obtained in the office today from the left tonsil. Neck: No palpable adenopathy or masses Respiratory: Breathing comfortably  Skin: No facial/neck lesions or rash noted.  Mouth biopsy  Date/Time: 11/14/2019 5:48 PM Performed by: Rozetta Nunnery, MD Authorized by: Rozetta Nunnery, MD   Consent:    Consent obtained:  Verbal   Consent given by:  Patient   Risks discussed:  Bleeding and pain Pre-procedure details:    Skin preparation:  None Sedation:    Sedation type:  None Anesthesia:    Anesthesia method:  Topical application   Topical anesthetic:  Benzocaine spray Procedure Details:    Location:  Oropharynx Comments:     A biopsy of the left tonsil was obtained in the office today and sent to pathology.  Patient had minimal bleeding and tolerated this well.    Assessment: Left tonsillitis versus neoplasia.  Plan: A biopsy was obtained in the office today.  Placed him on amoxicillin 500 mg 3 times daily for 1 week. Patient will call us back in 2 days concerning results of the biopsy.   Radene Journey, MD   CC:

## 2019-11-16 LAB — SURGICAL PATHOLOGY

## 2019-12-06 ENCOUNTER — Telehealth: Payer: Self-pay | Admitting: *Deleted

## 2019-12-06 NOTE — Telephone Encounter (Signed)
Copied from [place] [id number]. Topic: General - Other >> Dec 06, 2019  1:46 PM Keene Breath wrote: Reason for CRM: Calling to check the status of a fax sent regarding patient.  CB# [phone]

## 2019-12-07 ENCOUNTER — Encounter (HOSPITAL_COMMUNITY): Payer: Self-pay | Admitting: Family Medicine

## 2019-12-07 ENCOUNTER — Emergency Department (HOSPITAL_COMMUNITY): Payer: Medicare HMO

## 2019-12-07 ENCOUNTER — Other Ambulatory Visit: Payer: Self-pay | Admitting: *Deleted

## 2019-12-07 ENCOUNTER — Other Ambulatory Visit: Payer: Self-pay

## 2019-12-07 ENCOUNTER — Ambulatory Visit: Payer: Self-pay | Admitting: *Deleted

## 2019-12-07 ENCOUNTER — Emergency Department (HOSPITAL_COMMUNITY)
Admission: EM | Admit: 2019-12-07 | Discharge: 2019-12-08 | Disposition: A | Discharge status: Home / Self Care | Payer: Medicare HMO | Attending: Emergency Medicine | Admitting: Emergency Medicine

## 2019-12-07 DIAGNOSIS — Z87891 Personal history of nicotine dependence: Secondary | ICD-10-CM | POA: Diagnosis not present

## 2019-12-07 DIAGNOSIS — E039 Hypothyroidism, unspecified: Secondary | ICD-10-CM | POA: Insufficient documentation

## 2019-12-07 DIAGNOSIS — R531 Weakness: Secondary | ICD-10-CM | POA: Insufficient documentation

## 2019-12-07 DIAGNOSIS — Z8673 Personal history of transient ischemic attack (TIA), and cerebral infarction without residual deficits: Secondary | ICD-10-CM | POA: Insufficient documentation

## 2019-12-07 DIAGNOSIS — I5033 Acute on chronic diastolic (congestive) heart failure: Secondary | ICD-10-CM | POA: Diagnosis not present

## 2019-12-07 DIAGNOSIS — K529 Noninfective gastroenteritis and colitis, unspecified: Secondary | ICD-10-CM | POA: Diagnosis not present

## 2019-12-07 DIAGNOSIS — Z7901 Long term (current) use of anticoagulants: Secondary | ICD-10-CM | POA: Diagnosis not present

## 2019-12-07 DIAGNOSIS — I110 Hypertensive heart disease with heart failure: Secondary | ICD-10-CM | POA: Insufficient documentation

## 2019-12-07 DIAGNOSIS — K8020 Calculus of gallbladder without cholecystitis without obstruction: Secondary | ICD-10-CM | POA: Diagnosis not present

## 2019-12-07 DIAGNOSIS — I259 Chronic ischemic heart disease, unspecified: Secondary | ICD-10-CM | POA: Diagnosis not present

## 2019-12-07 DIAGNOSIS — Z85828 Personal history of other malignant neoplasm of skin: Secondary | ICD-10-CM | POA: Insufficient documentation

## 2019-12-07 DIAGNOSIS — M25552 Pain in left hip: Secondary | ICD-10-CM | POA: Diagnosis not present

## 2019-12-07 DIAGNOSIS — I4891 Unspecified atrial fibrillation: Secondary | ICD-10-CM | POA: Diagnosis not present

## 2019-12-07 DIAGNOSIS — I959 Hypotension, unspecified: Secondary | ICD-10-CM | POA: Diagnosis not present

## 2019-12-07 DIAGNOSIS — R52 Pain, unspecified: Secondary | ICD-10-CM | POA: Diagnosis not present

## 2019-12-07 DIAGNOSIS — R609 Edema, unspecified: Secondary | ICD-10-CM | POA: Diagnosis not present

## 2019-12-07 DIAGNOSIS — R791 Abnormal coagulation profile: Secondary | ICD-10-CM | POA: Diagnosis not present

## 2019-12-07 DIAGNOSIS — J449 Chronic obstructive pulmonary disease, unspecified: Secondary | ICD-10-CM | POA: Insufficient documentation

## 2019-12-07 DIAGNOSIS — Z79899 Other long term (current) drug therapy: Secondary | ICD-10-CM | POA: Insufficient documentation

## 2019-12-07 DIAGNOSIS — K921 Melena: Secondary | ICD-10-CM | POA: Diagnosis not present

## 2019-12-07 DIAGNOSIS — K4090 Unilateral inguinal hernia, without obstruction or gangrene, not specified as recurrent: Secondary | ICD-10-CM | POA: Diagnosis not present

## 2019-12-07 DIAGNOSIS — S79912A Unspecified injury of left hip, initial encounter: Secondary | ICD-10-CM | POA: Diagnosis not present

## 2019-12-07 DIAGNOSIS — R197 Diarrhea, unspecified: Secondary | ICD-10-CM | POA: Diagnosis not present

## 2019-12-07 DIAGNOSIS — S299XXA Unspecified injury of thorax, initial encounter: Secondary | ICD-10-CM | POA: Diagnosis not present

## 2019-12-07 LAB — CBC WITH DIFFERENTIAL/PLATELET
Abs Immature Granulocytes: 0.18 10*3/uL — ABNORMAL HIGH (ref 0.00–0.07)
Basophils Absolute: 0.1 10*3/uL (ref 0.0–0.1)
Basophils Relative: 1 %
Eosinophils Absolute: 0.2 10*3/uL (ref 0.0–0.5)
Eosinophils Relative: 2 %
HCT: 36.4 % — ABNORMAL LOW (ref 39.0–52.0)
Hemoglobin: 11.6 g/dL — ABNORMAL LOW (ref 13.0–17.0)
Immature Granulocytes: 2 %
Lymphocytes Relative: 9 %
Lymphs Abs: 1 10*3/uL (ref 0.7–4.0)
MCH: 31.6 pg (ref 26.0–34.0)
MCHC: 31.9 g/dL (ref 30.0–36.0)
MCV: 99.2 fL (ref 80.0–100.0)
Monocytes Absolute: 0.9 10*3/uL (ref 0.1–1.0)
Monocytes Relative: 8 %
Neutro Abs: 8.8 10*3/uL — ABNORMAL HIGH (ref 1.7–7.7)
Neutrophils Relative %: 78 %
Platelets: 185 10*3/uL (ref 150–400)
RBC: 3.67 MIL/uL — ABNORMAL LOW (ref 4.22–5.81)
RDW: 14.6 % (ref 11.5–15.5)
WBC: 11.1 10*3/uL — ABNORMAL HIGH (ref 4.0–10.5)
nRBC: 0 % (ref 0.0–0.2)

## 2019-12-07 LAB — COMPREHENSIVE METABOLIC PANEL
ALT: 15 U/L (ref 0–44)
AST: 21 U/L (ref 15–41)
Albumin: 3 g/dL — ABNORMAL LOW (ref 3.5–5.0)
Alkaline Phosphatase: 42 U/L (ref 38–126)
Anion gap: 9 (ref 5–15)
BUN: 25 mg/dL — ABNORMAL HIGH (ref 8–23)
CO2: 30 mmol/L (ref 22–32)
Calcium: 8.7 mg/dL — ABNORMAL LOW (ref 8.9–10.3)
Chloride: 105 mmol/L (ref 98–111)
Creatinine, Ser: 1.55 mg/dL — ABNORMAL HIGH (ref 0.61–1.24)
GFR calc Af Amer: 44 mL/min — ABNORMAL LOW (ref >60)
GFR calc non Af Amer: 38 mL/min — ABNORMAL LOW (ref >60)
Glucose, Bld: 100 mg/dL — ABNORMAL HIGH (ref 70–99)
Potassium: 3.3 mmol/L — ABNORMAL LOW (ref 3.5–5.1)
Sodium: 144 mmol/L (ref 135–145)
Total Bilirubin: 0.6 mg/dL (ref 0.3–1.2)
Total Protein: 5.9 g/dL — ABNORMAL LOW (ref 6.5–8.1)

## 2019-12-07 LAB — TYPE AND SCREEN
ABO/RH(D): A POS
Antibody Screen: NEGATIVE

## 2019-12-07 LAB — I-STAT CHEM 8, ED
BUN: 23 mg/dL (ref 8–23)
Calcium, Ion: 1.09 mmol/L — ABNORMAL LOW (ref 1.15–1.40)
Chloride: 104 mmol/L (ref 98–111)
Creatinine, Ser: 1.4 mg/dL — ABNORMAL HIGH (ref 0.61–1.24)
Glucose, Bld: 94 mg/dL (ref 70–99)
HCT: 35 % — ABNORMAL LOW (ref 39.0–52.0)
Hemoglobin: 11.9 g/dL — ABNORMAL LOW (ref 13.0–17.0)
Potassium: 3.2 mmol/L — ABNORMAL LOW (ref 3.5–5.1)
Sodium: 143 mmol/L (ref 135–145)
TCO2: 29 mmol/L (ref 22–32)

## 2019-12-07 LAB — TROPONIN I (HIGH SENSITIVITY): Troponin I (High Sensitivity): 13 ng/L (ref <18)

## 2019-12-07 LAB — PROTIME-INR
INR: 3.3 — ABNORMAL HIGH (ref 0.8–1.2)
Prothrombin Time: 33.2 seconds — ABNORMAL HIGH (ref 11.4–15.2)

## 2019-12-07 LAB — POC OCCULT BLOOD, ED: Fecal Occult Bld: NEGATIVE

## 2019-12-07 LAB — BRAIN NATRIURETIC PEPTIDE: B Natriuretic Peptide: 244.9 pg/mL — ABNORMAL HIGH (ref 0.0–100.0)

## 2019-12-07 MED ORDER — IOHEXOL 300 MG/ML  SOLN
80.0000 mL | Freq: Once | INTRAMUSCULAR | Status: AC | PRN
  Administered 2019-12-07: 20:00:00 80 mL via INTRAVENOUS

## 2019-12-07 NOTE — Telephone Encounter (Signed)
Noted  

## 2019-12-07 NOTE — ED Notes (Signed)
[facility] called  

## 2019-12-07 NOTE — Telephone Encounter (Signed)
Message Routed to PCP CMA 

## 2019-12-07 NOTE — Telephone Encounter (Signed)
Noted! FYI tried to call pt to make sure he called 911 but line was busy.

## 2019-12-07 NOTE — Telephone Encounter (Signed)
Called pt for more clarification about the fax PPW. There was no answer. Per new messge, pt was advise to call 911 for sx of diarrhea and bleeding.

## 2019-12-07 NOTE — ED Notes (Signed)
Patient transported to CT 

## 2019-12-07 NOTE — Patient Outreach (Signed)
[facility]) Care Management  [place]  12/07/2019   AJ CRUNKLETON [birth year] [id number]   [facility] Monthly Outreach   Referral Date:  01/17/2019 Referral Source:  Transfer from [place] Reason for Referral:  Continued Disease Management Education Insurance:  Medicare   Outreach Attempt:  Successful telephone outreach to patient for follow up.  HIPAA verified with patient.  Patient reporting he has had diarrhea for about 2 weeks now.  Encouraged patient to contact primary care provider for assistance in managing diarrhea.  Denies any shortness of breath but does report continued lower extremity edema that he states "is getting better".  Does report a fall a few weeks ago, just at the new year.  States he fell on his bottom and has a sore bruise.  Fall precautions and preventions reviewed and discussed and patient encouraged to use walker with all ambulation.  States he does not weigh himself at home.  Reports his wife is doing fair in skilled nursing facility.  Patient asking about COVID vaccine administration.  RN Health Coach provided patient with number to call and schedule appointment to receive vaccine [phone]) and discussed the locations for administration.  Encounter Medications:  Outpatient Encounter Medications as of 12/07/2019  Medication Sig Note  . atorvastatin (LIPITOR) 10 MG tablet TAKE 1 TABLET AT BEDTIME   . Calcium-Vitamin D (CALTRATE 600 PLUS-VIT D PO) Take 1 tablet by mouth at bedtime.    . docusate sodium (COLACE) 100 MG capsule Take 1 capsule (100 mg total) by mouth daily as needed for mild constipation.   . folic acid (FOLVITE) 1 MG tablet Take 1 mg by mouth daily.    . furosemide (LASIX) 40 MG tablet TAKE 2 TABLETS EVERY MORNING   . guaiFENesin (MUCINEX) 600 MG 12 hr tablet Take 600-1,200 mg by mouth 2 (two) times daily as needed (cold/congestion.).   Marland Kitchen methotrexate (RHEUMATREX) 2.5 MG tablet Take 10 mg by mouth  once a week. Caution:Chemotherapy. Protect from light.   . metolazone (ZAROXOLYN) 2.5 MG tablet Take 1 tablet (2.5 mg total) by mouth 2 (two) times a week. Take on Mondays and Thursdays.   . Multiple Vitamin (MULTIVITAMIN WITH MINERALS) TABS tablet Take 1 tablet by mouth daily.   . nitroGLYCERIN (NITROSTAT) 0.4 MG SL tablet DISSOLVE 1 TABLET UNDER THE TONGUE EVERY 5 MINUTES AS NEEDED FOR CHEST PAIN   . potassium chloride SA (KLOR-CON) 20 MEQ tablet Take 2 tablets (40 mEq total) by mouth every morning.   . Probiotic Product (HEALTHY COLON PO) Take 1 capsule by mouth daily.   . tamsulosin (FLOMAX) 0.4 MG CAPS capsule Take 1 capsule (0.4 mg total) by mouth every evening.   . TOPROL XL 25 MG 24 hr tablet TAKE ONE-HALF TABLET (12.5 MG TOTAL) DAILY WITH OR IMMEDIATELY FOLLOWING A MEAL (DOSE CHANGE)   . warfarin (COUMADIN) 5 MG tablet TAKE 1 TABLET DAILY EXCEPT TAKE ONE AND ONE-HALF TABLETS ON MONDAY, WEDNESDAY, AND FRIDAY OR TAKE AS DIRECTED BY ANTICOAGULATION CLINIC. (Patient taking differently: Take 5-7.5 mg by mouth daily. Take 1 1/2 tablets (7.5 mg) by mouth on Monday, Wednesday, Friday, take 1 tablet (5 mg) on Sunday, Tuesday, Thursday, Saturday - or as directed by anticoagulation clinic) 08/28/2019: Directions are per anti-coag visit 06/08/19  . [DISCONTINUED] diphenhydrAMINE (BENADRYL) 25 MG tablet Take 25 mg by mouth every 6 (six) hours as needed. For itching    No facility-administered encounter medications on file as of 12/07/2019.    Functional  Status:  In your present state of health, do you have any difficulty performing the following activities: 12/07/2019 02/08/2019  Hearing? Y N  Comment hard of hearing -  Vision? Tempie Donning  Comment trouble seeing Patient states that he is going soon but has not made an appointment.  He will call.  Difficulty concentrating or making decisions? N N  Walking or climbing stairs? Y Y  Comment unable to climb stairs -  Dressing or bathing? Tempie Donning  Comment needs  assistance dressing -  Doing errands, shopping? Tempie Donning  Comment daughters does shopping and drives to medical appointments he does n ot drive daughters  drive him  Preparing Food and eating ? Y -  Comment family prepares meals -  Using the Toilet? N -  In the past six months, have you accidently leaked urine? Y -  Comment some urinary incontinence -  Do you have problems with loss of bowel control? N -  Managing your Medications? Y -  Comment daughter manages medications -  Managing your Finances? Y -  Comment daughter assist managing finances -  Housekeeping or managing your Housekeeping? Y -  Comment family does house cleaning -  Some recent data might be hidden    Fall/Depression Screening: Fall Risk  12/07/2019 10/19/2019 06/15/2019  Falls in the past year? 1 1 0  Comment - - -  Number falls in past yr: 1 0 -  Comment Fall January 2021 - -  Injury with Fall? 0 0 -  Risk for fall due to : History of fall(s);Medication side effect;Impaired balance/gait;Impaired mobility;Impaired vision History of fall(s);Medication side effect;Impaired balance/gait;Impaired mobility;Impaired vision -  Follow up Falls evaluation completed;Education provided;Falls prevention discussed Falls evaluation completed;Education provided;Falls prevention discussed -   PHQ 2/9 Scores 02/08/2019 10/19/2018 12/18/2017 05/21/2017 05/18/2015  PHQ - 2 Score 0 0 0 0 0   [facility] CM Care Plan Problem One     Most Recent Value  Care Plan Problem One  Knowledge deficiet related to self care management of heart failure and importance of primary care follow up  Role Documenting the Problem One  [place] for Problem One  Active  [facility] Long Term Goal   Patient will verbalize scheduled appointment with primary care provider in the next 60 days.  [facility] Long Term Goal Start Date  12/07/19  [facility] Long Term Goal Met Date  12/07/19  Interventions for Problem One Long Term Goal  Discussed and reviewed care plan and goals,  reviewed medications and encourged medication compliance, discussed importance of daily wieght monitoring and encouraged patient to do so, fall preventions and precautions reviewed and discussed, encouraged to use walker with all ambulation, encouraged to keep lower extremities elevated while sitting, enouraged patient to discuss diarrhea with primary care provider, encouraged patient to schedule appointment with primary care provider and discussed importance of primary care follow up, encouraged to attend other scheduled medical appointments, provided patient with telephone number and locations to arrange [place] vaccination, provider patient with primary care telephone number for him to call and schedule appointment     Appointments:  Patient is unsure of last attending appointment with primary care provider.  Discussed importance of follow up with primary care.  Provided patient with primary's office number to call and schedule appointment and discuss current diarrhea.  Has scheduled appointment with Cardiologist, Dr. Meda Coffee on 12/14/2019.  Plan: RN Health Coach will send primary care provider quarterly update. RN Health Coach will make next telephone  outreach to patient within the month of March and patient agrees to future outreach.  [place] [phone] [email or web address]

## 2019-12-07 NOTE — ED Triage Notes (Signed)
Patient is from home and transported via [facility]. Home health aide informed EMS that patient has been experiencing diarrhea for the last 2 weeks, but today it was dark. Also, has pitting edema. EMS states patient is alert, oriented x 4.

## 2019-12-07 NOTE — Telephone Encounter (Signed)
I checked his chart and he is in the ER

## 2019-12-07 NOTE — Discharge Instructions (Addendum)
Hold coumadin for 2 days then restart. Recheck INR in a week.   See your GI doctor for follow up   Stay hydrated   See surgery for your hernia   Return to ER if you have worse abdominal pain, vomiting, fever.

## 2019-12-07 NOTE — ED Notes (Signed)
Pt called out needing to have a BM. Pt was assisted with standing and sitting on the bedside commode with RN Di Kindle assisting. Pt was given call light and instructed to use it to notify us when he is finished so we can help clean him and get him back in the bed safely. Pt understood.

## 2019-12-07 NOTE — Telephone Encounter (Signed)
Patient calls with black watery stools and dizziness for about two weeks. "If the walls were not holding him up, the floor would be". Denies using Pepto with the diarrhea. Reports he does not take iron but is being watched at the cancer center. Stated he is eating and drinking but not enough. No abdominal pain but has urgency with the diarrhea. Able to void today. He lives alone. I'm concerned he may be dehydrated. Advised calling 911 at this time. Stated he would.   Reason for Disposition . Black or tarry bowel movements  (Exception: chronic-unchanged  black-grey bowel movements AND is taking iron pills or Pepto-bismol)  Answer Assessment - Initial Assessment Questions 1. DIARRHEA SEVERITY: "How bad is the diarrhea?" "How many extra stools have you had in the past 24 hours than normal?"    - NO DIARRHEA (SCALE 0)   - MILD (SCALE 1-3): Few loose or mushy BMs; increase of 1-3 stools over normal daily number of stools; mild increase in ostomy output.   -  MODERATE (SCALE 4-7): Increase of 4-6 stools daily over normal; moderate increase in ostomy output. * SEVERE (SCALE 8-10; OR 'WORST POSSIBLE'): Increase of 7 or more stools daily over normal; moderate increase in ostomy output; incontinence.    Sunday 2 stools, 2 yesterday, one today. All liquid 2. ONSET: "When did the diarrhea begin?"      2 weeks 3. BM CONSISTENCY: "How loose or watery is the diarrhea?"     All water and no control 4. VOMITING: "Are you also vomiting?" If so, ask: "How many times in the past 24 hours?"      no 5. ABDOMINAL PAIN: "Are you having any abdominal pain?" If yes: "What does it feel like?" (e.g., crampy, dull, intermittent, constant)      no 6. ABDOMINAL PAIN SEVERITY: If present, ask: "How bad is the pain?"  (e.g., Scale 1-10; mild, moderate, or severe)   - MILD (1-3): doesn't interfere with normal activities, abdomen soft and not tender to touch    - MODERATE (4-7): interferes with normal activities or awakens from  sleep, tender to touch    - SEVERE (8-10): excruciating pain, doubled over, unable to do any normal activities       no 7. ORAL INTAKE: If vomiting, "Have you been able to drink liquids?" "How much fluids have you had in the past 24 hours?"     Able to drink and eat 8. HYDRATION: "Any signs of dehydration?" (e.g., dry mouth [not just dry lips], too weak to stand, dizziness, new weight loss) "When did you last urinate?"   Weak, says equilibrium is off 9. EXPOSURE: "Have you traveled to a foreign country recently?" "Have you been exposed to anyone with diarrhea?" "Could you have eaten any food that was spoiled?"     no 10. ANTIBIOTIC USE: "Are you taking antibiotics now or have you taken antibiotics in the past 2 months?"       no 11. OTHER SYMPTOMS: "Do you have any other symptoms?" (e.g., fever, blood in stool)       Black stool 12. PREGNANCY: "Is there any chance you are pregnant?" "When was your last menstrual period?"       na  Protocols used: [facility]

## 2019-12-07 NOTE — ED Provider Notes (Signed)
[facility] DEPT Provider Note   CSN: [id number] Arrival date & time: 12/07/19  1654     History Chief Complaint  Patient presents with  . Diarrhea    Dark Stools     [name] [name] is a 86 y.o. male hx of afib on coumadin, CAD, CHF, here presenting with dizziness, diarrhea, dark stools.  Patient states that he feels weak and tired and dizzy for the last several days .  Patient states that several days ago he did fall and to his left hip. He states that he is has some left hip pain afterwards but he is able to walk on the leg. Patient denies any head injury at that time.  Patient states that he is on Coumadin and has been having dark stools.  Patient also has some diarrhea as well.  The history is provided by the patient.       Past Medical History:  Diagnosis Date  . Atrial fibrillation, chronic (HCC)    a. on coumadin   . Carotid artery occlusion    right total internal artery occlusion  . Chronic diastolic CHF (congestive heart failure) ([place])   . Coronary artery disease    s/p cardiac cath in 1990s, and cardiolite study  in 2005 showing EF 54%  . Degenerative disc disease    cervical spine  . GERD (gastroesophageal reflux disease)   . HIV infection ([place])   . Hyperlipidemia   . Hypertension   . Hypothyroidism   . Rheumatoid arthritis ([place])   . S/P TAVR (transcatheter aortic valve replacement)    Edwards Sapien 3 THV (size 29 mm) via the R TF approach   . Skin cancer    forehead  . Stroke [facility])    TIA   . TIA (transient ischemic attack)   . Urgency incontinence     Patient Active Problem List   Diagnosis Date Noted  . Swelling 07/29/2019  . S/P TAVR (transcatheter aortic valve replacement)   . Urinary frequency 06/10/2018  . Lower respiratory infection 12/18/2017  . Iron deficiency anemia 12/16/2016  . Severe aortic stenosis 04/10/2015  . Chronic anticoagulation 02/17/2015  . Bilateral carotid artery disease ([place]) 09/12/2014    . Acute on chronic diastolic heart failure ([place]) 07/10/2014  . Hypothyroidism 07/04/2013  . Occlusion and stenosis of carotid artery without mention of cerebral infarction 05/18/2012  . COPD with asthma ([place]) 01/26/2012  . Long term (current) use of anticoagulants 02/24/2011  . Atrial fibrillation ([place]) 01/12/2009  . HLD (hyperlipidemia) 06/15/2007  . Benign essential HTN 06/15/2007    Past Surgical History:  Procedure Laterality Date  . AMPUTATION Right 02/21/2015   Procedure: Right 3rd Ray Amputation;  Surgeon: Newt Minion, MD;  Location: [facility];  Service: Orthopedics;  Laterality: Right;  Digital block and ankle block with MAC  . CARDIAC CATHETERIZATION  1990s  . CATARACT EXTRACTION W/ INTRAOCULAR LENS  IMPLANT, BILATERAL    . EYE SURGERY     cataracts bilateral  . INTRAOPERATIVE TRANSTHORACIC ECHOCARDIOGRAM  09/14/2018   Procedure: INTRAOPERATIVE TRANSTHORACIC ECHOCARDIOGRAM;  Surgeon: Sherren Mocha, MD;  Location: [facility];  Service: Open Heart Surgery;;  . RIGHT/LEFT HEART CATH AND CORONARY ANGIOGRAPHY N/A 07/21/2018   Procedure: RIGHT/LEFT HEART CATH AND CORONARY ANGIOGRAPHY;  Surgeon: Sherren Mocha, MD;  Location: [facility];  Service: Cardiovascular;  Laterality: N/A;  . SKIN CANCER EXCISION  12/2011   forehead  . TRANSCATHETER AORTIC VALVE REPLACEMENT, TRANSFEMORAL N/A 09/14/2018   Procedure:  TRANSCATHETER AORTIC VALVE REPLACEMENT, TRANSFEMORAL. 55m EDWARDS SAPIEN3 THV.;  Surgeon: CSherren Mocha MD;  Location: [facility]  Service: Open Heart Surgery;  Laterality: N/A;       Family History  Problem Relation Age of Onset  . Heart disease Mother   . Malignant hyperthermia Mother   . Hypertension Mother   . Heart disease Father   . Malignant hyperthermia Father   . Hyperlipidemia Father   . Hypertension Father   . Heart disease Sister   . Hypertension Sister     Social History   Tobacco Use  . Smoking status: Former Smoker    Packs/day: 1.00    Years:  40.00    Pack years: 40.00    Types: Cigarettes    Quit date: 09/12/1989    Years since quitting: 30.2  . Smokeless tobacco: Never Used  . Tobacco comment: quit around 1992  Substance Use Topics  . Alcohol use: No    Alcohol/week: 0.0 standard drinks    Comment: former, quit drinking in 1992  . Drug use: No    Home Medications Prior to Admission medications   Medication Sig Start Date End Date Taking? Authorizing Provider  atorvastatin (LIPITOR) 10 MG tablet TAKE 1 TABLET AT BEDTIME Patient taking differently: Take 10 mg by mouth at bedtime.  04/19/19  Yes NDorothy Spark MD  Calcium-Vitamin D (CALTRATE 600 PLUS-VIT D PO) Take 1 tablet by mouth at bedtime.    Yes [provider]  docusate sodium (COLACE) 100 MG capsule Take 1 capsule (100 mg total) by mouth daily as needed for mild constipation. 09/17/18  Yes TEileen Stanford PA-C  folic acid (FOLVITE) 1 MG tablet Take 1 mg by mouth daily.  03/06/16  Yes [provider]  furosemide (LASIX) 40 MG tablet TAKE 2 TABLETS EVERY MORNING Patient taking differently: Take 80 mg by mouth daily.  10/31/19  Yes TEileen Stanford PA-C  guaiFENesin (MUCINEX) 600 MG 12 hr tablet Take 600-1,200 mg by mouth 2 (two) times daily as needed (cold/congestion.).   Yes [provider]  methotrexate (RHEUMATREX) 2.5 MG tablet Take 10 mg by mouth once a week. Caution:Chemotherapy. Protect from light.   Yes [provider]  metolazone (ZAROXOLYN) 2.5 MG tablet Take 1 tablet (2.5 mg total) by mouth 2 (two) times a week. Take on Mondays and Thursdays. 06/06/19  Yes NDorothy Spark MD  Multiple Vitamin (MULTIVITAMIN WITH MINERALS) TABS tablet Take 1 tablet by mouth daily.   Yes [provider]  nitroGLYCERIN (NITROSTAT) 0.4 MG SL tablet DISSOLVE 1 TABLET UNDER THE TONGUE EVERY 5 MINUTES AS NEEDED FOR CHEST PAIN 06/23/19  Yes NDorothy Spark MD  potassium chloride SA (KLOR-CON) 20 MEQ tablet Take 2 tablets (40  mEq total) by mouth every morning. 10/31/19  Yes TEileen Stanford PA-C  Probiotic Product (HEALTHY COLON PO) Take 1 capsule by mouth daily.   Yes [provider]  tamsulosin (FLOMAX) 0.4 MG CAPS capsule Take 1 capsule (0.4 mg total) by mouth every evening. 05/11/19  Yes NDorothy Spark MD  TOPROL XL 25 MG 24 hr tablet TAKE ONE-HALF TABLET (12.5 MG TOTAL) DAILY WITH OR IMMEDIATELY FOLLOWING A MEAL (DOSE CHANGE) Patient taking differently: Take 12.5 mg by mouth daily.  05/30/19  Yes NDorothy Spark MD  warfarin (COUMADIN) 5 MG tablet TAKE 1 TABLET DAILY EXCEPT TAKE ONE AND ONE-HALF TABLETS ON MONDAY, WEDNESDAY, AND FRIDAY OR TAKE AS DIRECTED BY ANTICOAGULATION CLINIC. Patient taking differently: Take 5-7.5  mg by mouth daily. Take 1 1/2 tablets (7.5 mg) by mouth on Monday, Wednesday, Friday, take 1 tablet (5 mg) on Sunday, Tuesday, Thursday, Saturday - or as directed by anticoagulation clinic 06/14/19  Yes Nafziger, Tommi Rumps, NP  diphenhydrAMINE (BENADRYL) 25 MG tablet Take 25 mg by mouth every 6 (six) hours as needed. For itching  06/06/19  [provider]    Allergies    Levofloxacin and Klor-con [potassium chloride er]  Review of Systems   Review of Systems  Gastrointestinal: Positive for diarrhea.  All other systems reviewed and are negative.   Physical Exam Updated Vital Signs BP 121/63   Pulse 74   Temp 98.2 F (36.8 C) (Oral)   Resp 16   SpO2 96%   Physical Exam Vitals and nursing note reviewed.  Constitutional:      Comments: Chronically ill   HENT:     Nose: Nose normal.     Mouth/Throat:     Mouth: Mucous membranes are dry.  Eyes:     Extraocular Movements: Extraocular movements intact.     Pupils: Pupils are equal, round, and reactive to light.  Cardiovascular:     Rate and Rhythm: Normal rate.     Pulses: Normal pulses.     Heart sounds: Normal heart sounds.  Pulmonary:     Effort: Pulmonary effort is normal.     Breath sounds: Normal breath  sounds.  Abdominal:     General: Abdomen is flat.     Palpations: Abdomen is soft.  Genitourinary:    Comments: Rectal- brown stool  Musculoskeletal:     Cervical back: Normal range of motion.     Comments: Dec ROM L hip, mild tenderness over L pelvis, 2+ edema bilateral legs   Skin:    General: Skin is warm.     Capillary Refill: Capillary refill takes less than 2 seconds.  Neurological:     General: No focal deficit present.  Psychiatric:        Mood and Affect: Mood normal.     ED Results / Procedures / Treatments   Labs (all labs ordered are listed, but only abnormal results are displayed) Labs Reviewed  CBC WITH DIFFERENTIAL/PLATELET - Abnormal; Notable for the following components:      Result Value   WBC 11.1 (*)    RBC 3.67 (*)    Hemoglobin 11.6 (*)    HCT 36.4 (*)    Neutro Abs 8.8 (*)    Abs Immature Granulocytes 0.18 (*)    All other components within normal limits  COMPREHENSIVE METABOLIC PANEL - Abnormal; Notable for the following components:   Potassium 3.3 (*)    Glucose, Bld 100 (*)    BUN 25 (*)    Creatinine, Ser 1.55 (*)    Calcium 8.7 (*)    Total Protein 5.9 (*)    Albumin 3.0 (*)    GFR calc non Af Amer 38 (*)    GFR calc Af Amer 44 (*)    All other components within normal limits  PROTIME-INR - Abnormal; Notable for the following components:   Prothrombin Time 33.2 (*)    INR 3.3 (*)    All other components within normal limits  BRAIN NATRIURETIC PEPTIDE - Abnormal; Notable for the following components:   B Natriuretic Peptide 244.9 (*)    All other components within normal limits  I-STAT CHEM 8, ED - Abnormal; Notable for the following components:   Potassium 3.2 (*)    Creatinine,  Ser 1.40 (*)    Calcium, Ion 1.09 (*)    Hemoglobin 11.9 (*)    HCT 35.0 (*)    All other components within normal limits  POC OCCULT BLOOD, ED  TYPE AND SCREEN  ABO/RH  TROPONIN I (HIGH SENSITIVITY)  TROPONIN I (HIGH SENSITIVITY)    EKG EKG  Interpretation  Date/Time:  Wednesday December 07 2019 17:54:36 EST Ventricular Rate:  71 PR Interval:    QRS Duration: 171 QT Interval:  470 QTC Calculation: 511 R Axis:   -86 Text Interpretation: Atrial fibrillation Nonspecific IVCD with LAD Left ventricular hypertrophy Anterior infarct, old No significant change since last tracing Confirmed by Wandra Arthurs [phone]) on 12/07/2019 6:09:07 PM   Radiology DG Chest 1 View  Result Date: 12/07/2019 CLINICAL DATA:  Fall, left hip pain EXAM: CHEST  1 VIEW COMPARISON:  09/17/2018 FINDINGS: Cardiomegaly. Prior aortic valve repair. Aortic atherosclerosis. No confluent opacities, effusions or edema. No acute bony abnormality. IMPRESSION: Cardiomegaly.  No active disease. Electronically Signed   By: Rolm Baptise M.D.   On: 12/07/2019 18:28   CT Head Wo Contrast  Result Date: 12/07/2019 CLINICAL DATA:  Weakness edema EXAM: CT HEAD WITHOUT CONTRAST CT CERVICAL SPINE WITHOUT CONTRAST TECHNIQUE: Multidetector CT imaging of the head and cervical spine was performed following the standard protocol without intravenous contrast. Multiplanar CT image reconstructions of the cervical spine were also generated. COMPARISON:  PET CT 09/05/2019, CT brain 08/28/2019 FINDINGS: CT HEAD FINDINGS Brain: No acute territorial infarction, hemorrhage or intracranial mass. Advanced atrophy. Encephalomalacia in the right frontal lobe. Moderate hypodensity in the white matter consistent with chronic small vessel ischemic change. Vascular: No hyperdense vessels.  Carotid vascular calcification Skull: Normal. Negative for fracture or focal lesion. Sinuses/Orbits: Mild mucosal thickening in the maxillary and ethmoid sinuses Other: None CT CERVICAL SPINE FINDINGS Alignment: No subluxation.  Facet alignment within normal limits. Skull base and vertebrae: No acute fracture. No primary bone lesion or focal pathologic process. Soft tissues and spinal canal: No prevertebral fluid or swelling. No  visible canal hematoma. Disc levels: Advanced degenerative change at the C1-C2 articulation. Fusion of the right facet at C3 C4 and at the left facet at C2-C3 and C3-C4. Partial fusion of the posterior vertebral bodies at C3-C4. Moderate severe degenerative change at multiple levels, most notable at C5-C6 and C6-C7. Multiple level foraminal stenosis bilaterally. Upper chest: Emphysema at the apices. Other: None IMPRESSION: 1. No CT evidence for acute intracranial abnormality. Atrophy and chronic small vessel ischemic change of the white matter. Right frontal encephalomalacia as before 2. Multiple level degenerative change of the cervical spine. No acute osseous abnormality 3. Emphysema Electronically Signed   By: Donavan Foil M.D.   On: 12/07/2019 20:42   CT Cervical Spine Wo Contrast  Result Date: 12/07/2019 CLINICAL DATA:  Weakness edema EXAM: CT HEAD WITHOUT CONTRAST CT CERVICAL SPINE WITHOUT CONTRAST TECHNIQUE: Multidetector CT imaging of the head and cervical spine was performed following the standard protocol without intravenous contrast. Multiplanar CT image reconstructions of the cervical spine were also generated. COMPARISON:  PET CT 09/05/2019, CT brain 08/28/2019 FINDINGS: CT HEAD FINDINGS Brain: No acute territorial infarction, hemorrhage or intracranial mass. Advanced atrophy. Encephalomalacia in the right frontal lobe. Moderate hypodensity in the white matter consistent with chronic small vessel ischemic change. Vascular: No hyperdense vessels.  Carotid vascular calcification Skull: Normal. Negative for fracture or focal lesion. Sinuses/Orbits: Mild mucosal thickening in the maxillary and ethmoid sinuses Other: None CT CERVICAL SPINE FINDINGS Alignment:  No subluxation.  Facet alignment within normal limits. Skull base and vertebrae: No acute fracture. No primary bone lesion or focal pathologic process. Soft tissues and spinal canal: No prevertebral fluid or swelling. No visible canal hematoma.  Disc levels: Advanced degenerative change at the C1-C2 articulation. Fusion of the right facet at C3 C4 and at the left facet at C2-C3 and C3-C4. Partial fusion of the posterior vertebral bodies at C3-C4. Moderate severe degenerative change at multiple levels, most notable at C5-C6 and C6-C7. Multiple level foraminal stenosis bilaterally. Upper chest: Emphysema at the apices. Other: None IMPRESSION: 1. No CT evidence for acute intracranial abnormality. Atrophy and chronic small vessel ischemic change of the white matter. Right frontal encephalomalacia as before 2. Multiple level degenerative change of the cervical spine. No acute osseous abnormality 3. Emphysema Electronically Signed   By: Donavan Foil M.D.   On: 12/07/2019 20:42   CT ABDOMEN PELVIS W CONTRAST  Result Date: 12/07/2019 CLINICAL DATA:  86 year old male with abdominal distension and diarrhea. EXAM: CT ABDOMEN AND PELVIS WITH CONTRAST TECHNIQUE: Multidetector CT imaging of the abdomen and pelvis was performed using the standard protocol following bolus administration of intravenous contrast. CONTRAST:  53m OMNIPAQUE IOHEXOL 300 MG/ML  SOLN COMPARISON:  CT abdomen pelvis dated 02/05/2012. FINDINGS: Lower chest: The visualized lung bases are clear. There is moderate cardiomegaly. Partially visualized coronary vascular calcification. No intra-abdominal free air.  Small free fluid in the pelvis. Hepatobiliary: The liver is unremarkable. There is mild periportal edema. Small stones noted in the gallbladder. No pericholecystic fluid or evidence of acute cholecystitis by CT. Pancreas: No acute findings. No active inflammatory changes. No dilatation of the main pancreatic duct. Spleen: Normal in size without focal abnormality. Adrenals/Urinary Tract: Small bilateral adrenal nodules, similar to prior CT. There is no hydronephrosis on either side. There is symmetric enhancement and excretion of contrast by both kidneys. The visualized ureters and urinary  bladder appear unremarkable. Stomach/Bowel: Thickened appearance of the stomach may be related to underdistention. Gastritis is less likely but not excluded. Clinical correlation is recommended. There is redundancy of the sigmoid colon. There is no bowel obstruction. There is a 5 cm duodenal diverticulum. There is mild haziness of the mesentery in the left hemiabdomen. Clinical correlation is recommended to evaluate for enteritis. The appendix is normal. Vascular/Lymphatic: Advanced aortoiliac atherosclerotic disease. The IVC is unremarkable. No portal venous gas. There is no adenopathy. A 1.8 x 2.0 cm ovoid fluid attenuating structure to the right of the aorta (series 2, image 15) appears similar to prior CT of 2013. Reproductive: Mild enlargement of the prostate gland measuring 5.2 cm in transverse axial diameter. The seminal vesicles are symmetric. Other: Small fat containing left inguinal hernia. There is mild stranding of the herniated fat within the left inguinal canal which may be extension from the inflammatory changes of the left mesentery. A degree of strangulation or incarceration is not excluded. Clinical correlation is recommended. No fluid collection. Musculoskeletal: There is osteopenia with degenerative changes of the spine. Advanced osteoarthritic changes of the right hip. Grade 1 L4-L5 anterolisthesis. No acute osseous pathology. IMPRESSION: 1. Underdistention of the stomach versus gastritis. Clinical correlation is recommended. 2. Mild diffuse haziness of the mesentery in the left hemiabdomen. Clinical correlation is recommended to evaluate for enteritis. No bowel obstruction. Normal appendix. 3. Small fat containing left inguinal hernia. Stranding of the fat within the inguinal hernia may be extension of mesenteric stranding. Clinical correlation is recommended to evaluate for strangulation or incarceration. No fluid  collection. 4. Cholelithiasis. 5.  Aortic Atherosclerosis (ICD10-I70.0).  Electronically Signed   By: Anner Crete M.D.   On: 12/07/2019 20:50   DG Hip Unilat W or Wo Pelvis 2-3 Views Left  Result Date: 12/07/2019 CLINICAL DATA:  Fall, left hip pain EXAM: DG HIP (WITH OR WITHOUT PELVIS) 2-3V LEFT COMPARISON:  None. FINDINGS: Symmetric degenerative changes in the hips bilaterally with joint space narrowing and spurring. No acute bony abnormality. Specifically, no fracture, subluxation, or dislocation. Vascular calcifications present. IMPRESSION: No acute bony abnormality. Electronically Signed   By: Rolm Baptise M.D.   On: 12/07/2019 18:27    Procedures Procedures (including critical care time)  Medications Ordered in ED Medications  iohexol (OMNIPAQUE) 300 MG/ML solution 80 mL (80 mLs Intravenous Contrast Given 12/07/19 2001)    ED Course  I have reviewed the triage vital signs and the nursing notes.  Pertinent labs & imaging results that were available during my care of the patient were reviewed by me and considered in my medical decision making (see chart for details).    MDM Rules/Calculators/A&P                      DONTAVIS TSCHANTZ is a 86 y.o. male here with diarrhea, melena, fall.  Consider hip fracture versus GI bleed versus gastroenteritis.  Will get labs, hip x-rays, CT abdomen pelvis.   9:58 PM Cr is 1.5 (baseline around 1.1). CT ab/pel showed possible enteritis. He has L inguinal hernia that is reducible. No hip fracture on xray.  I think he likely has a mild enteritis.  His INR is 3.3.  Told him to hold off Coumadin for 2 days then restart the same dose.  He can follow-up with his GI outpatient.  Final Clinical Impression(s) / ED Diagnoses Final diagnoses:  None    Rx / DC Orders ED Discharge Orders    None       Drenda Freeze, MD 12/07/19 2200

## 2019-12-08 ENCOUNTER — Other Ambulatory Visit: Payer: Self-pay | Admitting: General Practice

## 2019-12-08 ENCOUNTER — Other Ambulatory Visit: Payer: Self-pay | Admitting: Adult Health

## 2019-12-08 DIAGNOSIS — Z7901 Long term (current) use of anticoagulants: Secondary | ICD-10-CM

## 2019-12-08 DIAGNOSIS — Z7401 Bed confinement status: Secondary | ICD-10-CM | POA: Diagnosis not present

## 2019-12-08 DIAGNOSIS — M2550 Pain in unspecified joint: Secondary | ICD-10-CM | POA: Diagnosis not present

## 2019-12-08 DIAGNOSIS — R197 Diarrhea, unspecified: Secondary | ICD-10-CM | POA: Diagnosis not present

## 2019-12-08 DIAGNOSIS — I959 Hypotension, unspecified: Secondary | ICD-10-CM | POA: Diagnosis not present

## 2019-12-08 LAB — ABO/RH: ABO/RH(D): A POS

## 2019-12-08 MED ORDER — WARFARIN SODIUM 5 MG PO TABS: ORAL_TABLET | ORAL | 1 refills | Status: AC

## 2019-12-08 NOTE — Telephone Encounter (Signed)
[place] for refill? Looks like pt is scheduled to see you 12/21/2019

## 2019-12-14 ENCOUNTER — Ambulatory Visit: Payer: Medicare Other | Admitting: Hematology

## 2019-12-14 ENCOUNTER — Other Ambulatory Visit: Payer: Self-pay

## 2019-12-14 ENCOUNTER — Encounter: Payer: Self-pay | Admitting: Cardiology

## 2019-12-14 ENCOUNTER — Other Ambulatory Visit: Payer: Medicare Other

## 2019-12-14 ENCOUNTER — Telehealth: Payer: Self-pay | Admitting: Internal Medicine

## 2019-12-14 ENCOUNTER — Ambulatory Visit (INDEPENDENT_AMBULATORY_CARE_PROVIDER_SITE_OTHER): Payer: Medicare HMO | Admitting: Cardiology

## 2019-12-14 VITALS — BP 118/56 | HR 79 | Ht 72.0 in | Wt 179.8 lb

## 2019-12-14 DIAGNOSIS — I5032 Chronic diastolic (congestive) heart failure: Secondary | ICD-10-CM

## 2019-12-14 DIAGNOSIS — I5043 Acute on chronic combined systolic (congestive) and diastolic (congestive) heart failure: Secondary | ICD-10-CM

## 2019-12-14 DIAGNOSIS — I5042 Chronic combined systolic (congestive) and diastolic (congestive) heart failure: Secondary | ICD-10-CM | POA: Diagnosis not present

## 2019-12-14 DIAGNOSIS — I5033 Acute on chronic diastolic (congestive) heart failure: Secondary | ICD-10-CM

## 2019-12-14 MED ORDER — METOLAZONE 2.5 MG PO TABS: 2.5000 mg | ORAL_TABLET | ORAL | 1 refills | Status: DC

## 2019-12-14 NOTE — Patient Instructions (Addendum)
Medication Instructions:   DECREASE YOUR METOLAZONE TO 2.5 MG BY MOUTH ONCE WEEKLY--TAKE ON TUESDAYS ONLY  *If you need a refill on your cardiac medications before your next appointment, please call your pharmacy*    You have been referred to [place]: At [facility], you and your health needs are our priority.  As part of our continuing mission to provide you with exceptional heart care, we have created designated Provider Care Teams.  These Care Teams include your primary Cardiologist (physician) and Advanced Practice Providers (APPs -  Physician Assistants and Nurse Practitioners) who all work together to provide you with the care you need, when you need it.  Your next appointment:   3 month(s)  The format for your next appointment:   In Person  Provider:   Ena Dawley, MD

## 2019-12-14 NOTE — Telephone Encounter (Signed)
[facility] Palliative appt scheduled for 12-22-19 at 1:00.

## 2019-12-14 NOTE — Progress Notes (Signed)
[facility]    Cardiology Office Note    Date:  12/14/2019   ID:  [name] [name], DOB 16-Dec-1924, MRN [id number] PCP:  [name], NP  Cardiologist:  Dr. Meda Coffee / Dr. Burt Knack & Dr. Roxy Manns (TAVR)   CC: 3 months follow-up  History of Present Illness:  [name] [name] is a 86 y.o. male with a history of CAD, chronic diastolic CHF, longstanding persistant atrial fibrillation on coumadin, carotid artery disease with CTO RICA, history of TIA, HTN, HLD, hypothyroidism, urinary incontinence and severe aortic stenosis s/p TAVR (09/14/18) with a 29 mm Edwards Sapien 3 THV via the TF approach on 09/14/18. Post operative echoshowed EF 55%, normally functioning TAVR with mean gradient of 8 mm Hg and no PVL. ECG with afib with new LBBB but no high grade heart block. He was enrolled in the [employer] trial and randomized back to Coumadin. He was discharged on Aspirin and resumed on home coumadin.  He was seen in the structural heart/TAVR clinic in November and was doing well.  Echocardiogram performed that they showed normal transvalvular gradients however significant drop in LVEF from 55 to 60% to 25 to 30%. He was seen again in July 2020 when he was doing well, living independently at home, only complaining of chronic lower extremity edema that was stable.  He was found to have a melanoma on his right flank, he underwent biopsy that showed local extension of the tumor and PET scan was performed that showed hypermetabolic activity in his right flank region where his primary melanoma, but also possible metastasis in the left oropharynx and several hypermetabolic bone lesions with concern for metastatic disease. One of the most prominent lesions is in the L4 vertebral body.   The patient is coming accompanied by his son, he states that last 3 months he has been feeling worse, his balance and strength is getting worse, he continues to live alone and walks around his house with a walker however has  fallen few times in the last 3 months and on one occasion had to go to the ER.  His appetite is not great and he has lost weight in the last few months.  He sleeps in a recliner as it is easier than to get to bed, he continues to have stable lower extremity edema.  Past Medical History:  Diagnosis Date  . Atrial fibrillation, chronic (HCC)    a. on coumadin   . Carotid artery occlusion    right total internal artery occlusion  . Chronic diastolic CHF (congestive heart failure) ([place])   . Coronary artery disease    s/p cardiac cath in 1990s, and cardiolite study  in 2005 showing EF 54%  . Degenerative disc disease    cervical spine  . GERD (gastroesophageal reflux disease)   . HIV infection ([place])   . Hyperlipidemia   . Hypertension   . Hypothyroidism   . Rheumatoid arthritis ([place])   . S/P TAVR (transcatheter aortic valve replacement)    Edwards Sapien 3 THV (size 29 mm) via the R TF approach   . Skin cancer    forehead  . Stroke [facility])    TIA   . TIA (transient ischemic attack)   . Urgency incontinence     Past Surgical History:  Procedure Laterality Date  . AMPUTATION Right 02/21/2015   Procedure: Right 3rd Ray Amputation;  Surgeon: Newt Minion, MD;  Location: [place];  Service: Orthopedics;  Laterality: Right;  Digital block  and ankle block with MAC  . CARDIAC CATHETERIZATION  1990s  . CATARACT EXTRACTION W/ INTRAOCULAR LENS  IMPLANT, BILATERAL    . EYE SURGERY     cataracts bilateral  . INTRAOPERATIVE TRANSTHORACIC ECHOCARDIOGRAM  09/14/2018   Procedure: INTRAOPERATIVE TRANSTHORACIC ECHOCARDIOGRAM;  Surgeon: Sherren Mocha, MD;  Location: [place];  Service: Open Heart Surgery;;  . RIGHT/LEFT HEART CATH AND CORONARY ANGIOGRAPHY N/A 07/21/2018   Procedure: RIGHT/LEFT HEART CATH AND CORONARY ANGIOGRAPHY;  Surgeon: Sherren Mocha, MD;  Location: [place];  Service: Cardiovascular;  Laterality: N/A;  . SKIN CANCER EXCISION  12/2011   forehead  . TRANSCATHETER AORTIC VALVE  REPLACEMENT, TRANSFEMORAL N/A 09/14/2018   Procedure: TRANSCATHETER AORTIC VALVE REPLACEMENT, TRANSFEMORAL. 79m EDWARDS SAPIEN3 THV.;  Surgeon: CSherren Mocha MD;  Location: [place]  Service: Open Heart Surgery;  Laterality: N/A;   Current Medications: Outpatient Medications Prior to Visit  Medication Sig Dispense Refill  . atorvastatin (LIPITOR) 10 MG tablet Take 10 mg by mouth at bedtime.    . Calcium-Vitamin D (CALTRATE 600 PLUS-VIT D PO) Take 1 tablet by mouth at bedtime.     . folic acid (FOLVITE) 1 MG tablet Take 1 mg by mouth daily.     . furosemide (LASIX) 40 MG tablet Take 80 mg by mouth daily.    .Marland KitchenguaiFENesin (MUCINEX) 600 MG 12 hr tablet Take 600-1,200 mg by mouth 2 (two) times daily as needed (cold/congestion.).    .Marland Kitchenmethotrexate (RHEUMATREX) 2.5 MG tablet Take 10 mg by mouth once a week. Caution:Chemotherapy. Protect from light.    . metoprolol succinate (TOPROL XL) 25 MG 24 hr tablet Take 12.5 mg by mouth daily.    . Multiple Vitamin (MULTIVITAMIN WITH MINERALS) TABS tablet Take 1 tablet by mouth daily.    . nitroGLYCERIN (NITROSTAT) 0.4 MG SL tablet DISSOLVE 1 TABLET UNDER THE TONGUE EVERY 5 MINUTES AS NEEDED FOR CHEST PAIN 25 tablet 4  . potassium chloride SA (KLOR-CON) 20 MEQ tablet Take 2 tablets (40 mEq total) by mouth every morning. 180 tablet 3  . Probiotic Product (HEALTHY COLON PO) Take 1 capsule by mouth daily.    . tamsulosin (FLOMAX) 0.4 MG CAPS capsule Take 1 capsule (0.4 mg total) by mouth every evening. 90 capsule 3  . warfarin (COUMADIN) 5 MG tablet Take 1 tablet daily except take 1 1/2 tablets Mon Wed and Fridays or Take as directed by anticoagulation clinic 120 tablet 1  . metolazone (ZAROXOLYN) 2.5 MG tablet Take 1 tablet (2.5 mg total) by mouth 2 (two) times a week. Take on Mondays and Thursdays. 30 tablet 3  . atorvastatin (LIPITOR) 10 MG tablet TAKE 1 TABLET AT BEDTIME (Patient taking differently: Take 10 mg by mouth at bedtime. ) 90 tablet 2  .  diphenhydrAMINE (BENADRYL) 25 MG tablet Take 25 mg by mouth every 6 (six) hours as needed. For itching    . docusate sodium (COLACE) 100 MG capsule Take 1 capsule (100 mg total) by mouth daily as needed for mild constipation. (Patient not taking: Reported on 12/14/2019) 30 capsule 6  . furosemide (LASIX) 40 MG tablet TAKE 2 TABLETS EVERY MORNING (Patient taking differently: Take 80 mg by mouth daily. ) 180 tablet 3  . TOPROL XL 25 MG 24 hr tablet TAKE ONE-HALF TABLET (12.5 MG TOTAL) DAILY WITH OR IMMEDIATELY FOLLOWING A MEAL (DOSE CHANGE) (Patient taking differently: Take 12.5 mg by mouth daily. ) 45 tablet 2   No facility-administered medications prior to visit.  Allergies:   Levofloxacin and Klor-con [potassium chloride er]   Social History   Socioeconomic History  . Marital status: Married    Spouse name: Not on file  . Number of children: Not on file  . Years of education: Not on file  . Highest education level: Not on file  Occupational History  . Not on file  Tobacco Use  . Smoking status: Former Smoker    Packs/day: 1.00    Years: 40.00    Pack years: 40.00    Types: Cigarettes    Quit date: 09/12/1989    Years since quitting: 30.2  . Smokeless tobacco: Never Used  . Tobacco comment: quit around 1992  Substance and Sexual Activity  . Alcohol use: No    Alcohol/week: 0.0 standard drinks    Comment: former, quit drinking in 1992  . Drug use: No  . Sexual activity: Not Currently  Other Topics Concern  . 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 Difficulty of Paying Living Expenses: Not hard at all  Food Insecurity: No Food Insecurity  . Worried About Charity fundraiser in the Last Year: Never true  . Ran Out of Food in the Last Year: Never true    Transportation Needs: No Transportation Needs  . Lack of Transportation (Medical): No  . Lack of Transportation (Non-Medical): No  Physical Activity:   . Days of Exercise per Week: Not on file  . Minutes of Exercise per Session: Not on file  Stress:   . Feeling of Stress : Not on file  Social Connections:   . Frequency of Communication with Friends and Family: Not on file  . Frequency of Social Gatherings with Friends and Family: Not on file  . Attends Religious Services: Not on file  . Active Member of Clubs or Organizations: Not on file  . Attends Archivist Meetings: Not on file  . Marital Status: Not on file    Family History:  The patient's family history includes Heart disease in his father, mother, and sister; Hyperlipidemia in his father; Hypertension in his father, mother, and sister; Malignant hyperthermia in his father and mother.     ROS:   Please see the history of present illness.    ROS All other systems reviewed and are negative.  PHYSICAL EXAM:   VS:  BP (!) 118/56   Pulse 79   Ht 6' (1.829 m)   Wt 179 lb 12.8 oz (81.6 kg)   SpO2 94%   BMI 24.39 kg/m    GEN: Well nourished, well developed, in no acute distress HEENT: normal Neck: no JVD or masses Cardiac: irreg irreg; soft murmur. no rubs, or gallops, 2+ bilateral LE edema with chronic venous stasis changes.  Respiratory:  clear to auscultation bilaterally, normal work of breathing GI: soft, nontender, nondistended, + BS MS: no deformity or atrophy Skin: warm and dry, no rash Neuro:  Alert and Oriented x 3, Strength and sensation are intact Psych: euthymic mood, full affect  Wt Readings from Last 3 Encounters:  12/14/19 179 lb 12.8 oz (81.6 kg)  11/02/19 191 lb 1.6 oz (86.7 kg)  09/19/19 192 lb (87.1 kg)    Studies/Labs Reviewed:   EKG:  EKG is ordered today.  The ekg ordered today demonstrates aifb with PVCs, HR 67, LBBB  Recent Labs: 02/07/2019: NT-Pro BNP 2,119 12/07/2019: ALT 15; B  Natriuretic Peptide 244.9; BUN 25; BUN 23; Creatinine, Ser 1.55; Creatinine, Ser 1.40; Hemoglobin 11.6; Hemoglobin 11.9; Platelets 185; Potassium 3.3; Potassium 3.2; Sodium 144; Sodium 143   Lipid Panel    Component Value Date/Time   CHOL 106 05/23/2015 0958   TRIG 89.0 05/23/2015 0958   TRIG 25 03/31/2010 0000   HDL 34.40 (L) 05/23/2015 0958   CHOLHDL 3 05/23/2015 0958   VLDL 17.8 05/23/2015 0958   LDLCALC 54 05/23/2015 0958    Additional studies/ records that were reviewed today include:    Post operative echo 09/15/18:  Study Conclusions  - Left ventricle: The cavity size was normal. Wall thickness was  increased in a pattern of mild LVH. Systolic function was normal.  The estimated ejection fraction was in the range of 55% to 65%.  Wall motion was normal; there were no regional wall motion  abnormalities.  - Aortic valve: A bioprosthesis was present. Valve area (VTI): 2.35  cm^2. Valve area (Vmax): 2.53 cm^2. Valve area (Vmean): 2.4 cm^2.  - Mitral valve: There was mild regurgitation.  - Left atrium: The atrium was mildly dilated.  - Right atrium: The atrium was moderately dilated.  - Pulmonary arteries: Systolic pressure was moderately increased.  PA peak pressure: 52 mm Hg (S).   ________________  Echo 10/14/18  - Left ventricle: The cavity size was normal. Wall thickness was   increased in a pattern of mild LVH. Systolic function was   severely reduced. The estimated ejection fraction was in the   range of 25% to 30%. - Aortic valve: A bioprosthesis was present. There was trivial   regurgitation. Valve area (VTI): 2.43 cm^2. Valve area (Vmax):   2.31 cm^2. Valve area (Vmean): 2.5 cm^2. - Mitral valve: There was mild to moderate regurgitation. - Left atrium: The atrium was severely dilated. - Right ventricle: The cavity size was mildly dilated. - Right atrium: The atrium was severely dilated. - Tricuspid valve: There was mild-moderate  regurgitation.  TTE: 09/19/2019   1. Left ventricular ejection fraction, by visual estimation, is 20 to 25%. The left ventricle has severely decreased function. Mildly increased left ventricular size. There is no left ventricular hypertrophy.  2. Left ventricular diastolic function could not be evaluated pattern of LV diastolic filling.  3. Global right ventricle has normal systolic function.The right ventricular size is normal.  4. Left atrial size was severely dilated.  5. Right atrial size was severely dilated.  6. Mild mitral annular calcification.  7. The mitral valve is normal in structure. Mild mitral valve regurgitation. No evidence of mitral stenosis.  8. The tricuspid valve is normal in structure. Tricuspid valve regurgitation is mild.  9. Aortic valve regurgitation is trivial by color flow Doppler. Structurally normal aortic valve, with no evidence of sclerosis or stenosis. 10. The pulmonic valve was not well visualized. Pulmonic valve regurgitation is trivial by color flow Doppler. 11. Aortic dilatation noted. 12. There is mild dilatation of the ascending aorta measuring 38 mm. 13. Mildly elevated pulmonary artery systolic pressure. 14. The inferior vena cava is normal in size with greater than 50% respiratory variability, suggesting right atrial pressure of 3 mmHg. 15. Severe global reduction in LV systolic function; mild LVE; mildly dilated ascending aorta; severe biatrial enlargement; s/p AVR with mean gradient of 6 mmHg and AVA 1.8 cm2 and trace AI;  mild MR and TR.  Whole-body PET scan 09/05/2019  Focus of FDG accumulation in the skin of the right flank region, the level of the posterior right eleventh rib. This could be related to the patient's primary lesion or granulation from biopsy. 2. Extensive edema noted in the lower extremities bilaterally with areas of focal hypermetabolic uptake identified in each leg, as above. Findings may be related to infection/inflammation,  but cutaneous/subcutaneous neoplasm not excluded. 3. Focal hypermetabolism in the left oropharynx. Primary mucosal lesion not excluded. Direct visualization recommended to further evaluate as primary neoplasm not excluded. 4. Several hypermetabolic bone lesions without underlying abnormality visible on CT imaging. Appearance raises concern for metastatic disease. One of the most prominent lesions is in the L4 vertebral body. Lumbar spine MRI without and with contrast may prove helpful to further evaluate.   ASSESSMENT & PLAN:   Severe AS s/p TAVR: significant drop in his LVEF from 55-65% to 25-30% in 09/2018, unchanged on the recent echo performed today, normal transaortic gradient, no paravalvular leak.  Acute on chronic systolic CHF -The patient is asymptomatic but has chronic lower extremity edema, he has lost 12 pounds since the last visit, his potassium has been low with increased creatinine, I will reduce metolazone 2.5 mg to once weekly.   -He is losing his strength and muscle mass, he is now NYHA class III  HTN: BP well controlled today.    Chronic afib: rate well controlled. Continue coumadin for thromboembolic prophylaxis.  We might need to discontinue if he continues to fall.  EKG today shows atrial fibrillation with ventricular rate 70 bpm and left bundle branch block unchanged from prior.  CAD: pre TAVR cath showed calcified coronaries with mild non obst CAD and mod CAD in LAD (70% pLAD stenosis). Medical therapy was recommended.    Deconditioning: Weight loss, muscle wasting, patient and his son are requesting if we can refer the patient to palliative care will be happy to do so.  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  The patient is being followed  by oncologist Dr. Irene Limbo.  Repeat PET scan scheduled for January 25, 2020.  Medication Adjustments/Labs and Tests Ordered: Current medicines are reviewed at length with the patient today.  Concerns regarding medicines are outlined above.  Medication changes, Labs and Tests ordered today are listed in the Patient Instructions below. Patient Instructions  Medication Instructions:   DECREASE YOUR METOLAZONE TO 2.5 MG BY MOUTH ONCE WEEKLY--TAKE ON TUESDAYS ONLY  *If you need a refill on your cardiac medications before your next appointment, please call your pharmacy*    You have been referred to [place]: At [facility], you and your health needs are our priority.  As part of our continuing mission to provide you with exceptional heart care, we have created designated Provider Care Teams.  These Care Teams include your primary Cardiologist (physician) and Advanced Practice Providers (APPs -  Physician Assistants and Nurse Practitioners) who all work together to provide you with the care you need, when you need it.  Your next appointment:   3 month(s)  The format for your next appointment:   In Person  Provider:   Ena Dawley, MD  Signed, Ena Dawley, MD  12/14/2019 1:02 PM    [address], [place], [place]  [address] Phone: [phone]; Fax: [phone]

## 2019-12-14 NOTE — Telephone Encounter (Signed)
Phone call placed to [name] [name] to offer to schedule a visit with Palliative Care. Phone rang, with no answer I left a voicemail for call back.

## 2019-12-20 ENCOUNTER — Other Ambulatory Visit: Payer: Self-pay

## 2019-12-20 ENCOUNTER — Other Ambulatory Visit: Payer: Self-pay | Admitting: Cardiology

## 2019-12-20 ENCOUNTER — Telehealth: Payer: Self-pay | Admitting: *Deleted

## 2019-12-20 MED ORDER — POTASSIUM CHLORIDE CRYS ER 20 MEQ PO TBCR: 40.0000 meq | EXTENDED_RELEASE_TABLET | Freq: Every morning | ORAL | 3 refills | Status: DC

## 2019-12-20 NOTE — Telephone Encounter (Signed)
Message Routed to PCP, please advise. 

## 2019-12-20 NOTE — Telephone Encounter (Signed)
Patient called after hours line. Patient reports he is returning call about the possibility of surgery for skin cancer. Heart doctor declined it and wrote a letter. He has had a valve replacement and she didn't think he would make it off of the table. States He was in ER last week with diarrhea and wasn't given medicine or advice. He still has diarrhea though it comes and goes. Yesterday he had two cases. States sometimes the stool is dark with black tint. States he is eating and drinking okay. No other symptoms.

## 2019-12-21 ENCOUNTER — Encounter: Payer: Self-pay | Admitting: Adult Health

## 2019-12-21 ENCOUNTER — Ambulatory Visit (INDEPENDENT_AMBULATORY_CARE_PROVIDER_SITE_OTHER): Payer: Medicare HMO | Admitting: Adult Health

## 2019-12-21 ENCOUNTER — Ambulatory Visit (INDEPENDENT_AMBULATORY_CARE_PROVIDER_SITE_OTHER): Payer: Medicare HMO | Admitting: General Practice

## 2019-12-21 VITALS — BP 110/60 | HR 69 | Temp 97.6°F | Ht 72.0 in | Wt 181.0 lb

## 2019-12-21 DIAGNOSIS — N289 Disorder of kidney and ureter, unspecified: Secondary | ICD-10-CM

## 2019-12-21 DIAGNOSIS — R197 Diarrhea, unspecified: Secondary | ICD-10-CM | POA: Diagnosis not present

## 2019-12-21 DIAGNOSIS — Z7901 Long term (current) use of anticoagulants: Secondary | ICD-10-CM | POA: Diagnosis not present

## 2019-12-21 DIAGNOSIS — M25552 Pain in left hip: Secondary | ICD-10-CM

## 2019-12-21 LAB — POCT INR: INR: 8 — AB (ref 2.0–3.0)

## 2019-12-21 NOTE — Patient Instructions (Signed)
It was great seeing you today and I am glad you are recovering well.   I am going to have you stop Imodium and see if the diarrhea comes back. If you continue to have diarrhea after you stop imodium then you can take 1/2 tab of imodium, but please let me know if the diarrhea comes back.

## 2019-12-21 NOTE — Progress Notes (Signed)
I have reviewed the results and agree with this plan   

## 2019-12-21 NOTE — Patient Instructions (Addendum)
Pre visit review using our clinic review tool, if applicable. No additional management support is needed unless otherwise documented below in the visit note.  Stop coumadin!!! Recheck in one week. Go directly to the emergency room if any unusual bleeding occurs. Daughter has been advised of dosing change.

## 2019-12-21 NOTE — Progress Notes (Signed)
Subjective:    Patient ID: [name] [name], male    DOB: August 27, 1925, 86 y.o.   MRN: [id number]  HPI  86 year old male who  has a past medical history of Atrial fibrillation, chronic ([place]), Carotid artery occlusion, Chronic diastolic CHF (congestive heart failure) ([place]), Coronary artery disease, Degenerative disc disease, GERD (gastroesophageal reflux disease), HIV infection ([place]), Hyperlipidemia, Hypertension, Hypothyroidism, Rheumatoid arthritis ([place]), S/P TAVR (transcatheter aortic valve replacement), Skin cancer, Stroke ([place]), TIA (transient ischemic attack), and Urgency incontinence.  He presents to the clinic today for follow up after ER visit.  Presented to the emergency room 14 days ago on 12/07/2019 with a complaint of dizziness, diarrhea, and dark stools.  Reported that he felt weak and tired and dizzy for several days prior to being seen.  He also stated that several days prior he had fallen onto his left hip.  He had some left hip pain afterwards but was able to walk on the leg.  He denies a head injury at this time.  He is on Coumadin and has been having dark stools as well  His work-up in the emergency room that he had no hip fracture on x-ray, his CT abdomen and pelvis showed possible enteritis.  He had a left inguinal hernia that was reducible.  His INR was 3.3 and he was advised to hold off on Coumadin for 2 days and restart the same dose. His kidney function was decreased past baseline and it was thought to be from dehydration.   Lab Results  Component Value Date   CREATININE 1.55 (H) 12/07/2019   CREATININE 1.40 (H) 12/07/2019   BUN 25 (H) 12/07/2019   BUN 23 12/07/2019   NA 144 12/07/2019   NA 143 12/07/2019   K 3.3 (L) 12/07/2019   K 3.2 (L) 12/07/2019   CL 105 12/07/2019   CL 104 12/07/2019   CO2 30 12/07/2019     Today he reports that he has not had diarrhea since yesterday morning.  He is taking Imodium.  He is also not noticed any dark stools.  Reports that his  left hip continues to be sore but he is able to ambulate and he has had no falls since.    He is eating and drinking without difficulty.  Denies abdominal pain, nausea, vomiting, fevers or chills   Review of Systems See HPI   Past Medical History:  Diagnosis Date  . Atrial fibrillation, chronic (HCC)    a. on coumadin   . Carotid artery occlusion    right total internal artery occlusion  . Chronic diastolic CHF (congestive heart failure) ([place])   . Coronary artery disease    s/p cardiac cath in 1990s, and cardiolite study  in 2005 showing EF 54%  . Degenerative disc disease    cervical spine  . GERD (gastroesophageal reflux disease)   . HIV infection ([place])   . Hyperlipidemia   . Hypertension   . Hypothyroidism   . Rheumatoid arthritis ([place])   . S/P TAVR (transcatheter aortic valve replacement)    Edwards Sapien 3 THV (size 29 mm) via the R TF approach   . Skin cancer    forehead  . Stroke [facility])    TIA   . TIA (transient ischemic attack)   . Urgency incontinence     Social History   Socioeconomic History  . Marital status: Married    Spouse name: Not on file  . Number of children: Not on file  .  Years of education: Not on file  . Highest education level: Not on file  Occupational History  . Not on file  Tobacco Use  . Smoking status: Former Smoker    Packs/day: 1.00    Years: 40.00    Pack years: 40.00    Types: Cigarettes    Quit date: 09/12/1989    Years since quitting: 30.2  . Smokeless tobacco: Never Used  . Tobacco comment: quit around 1992  Substance and Sexual Activity  . Alcohol use: No    Alcohol/week: 0.0 standard drinks    Comment: former, quit drinking in 1992  . Drug use: No  . Sexual activity: Not Currently  Other Topics Concern  . 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 Difficulty of Paying Living Expenses: Not hard at all  Food Insecurity: No Food Insecurity  . Worried About Charity fundraiser in the Last Year: Never true  . Ran Out of Food in the Last Year: Never true  Transportation Needs: No Transportation Needs  . Lack of Transportation (Medical): No  . Lack of Transportation (Non-Medical): No  Physical Activity:   . Days of Exercise per Week: Not on file  . Minutes of Exercise per Session: Not on file  Stress:   . Feeling of Stress : Not on file  Social Connections:   . Frequency of Communication with Friends and Family: Not on file  . Frequency of Social Gatherings with Friends and Family: Not on file  . Attends Religious Services: Not on file  . Active Member of Clubs or Organizations: Not on file  . Attends Archivist Meetings: Not on file  . Marital Status: Not on file  Intimate Partner Violence:   . Fear of Current or Ex-Partner: Not on file  . Emotionally Abused: Not on file  . Physically Abused: Not on file  . Sexually Abused: Not on file    Past Surgical History:  Procedure Laterality Date  . AMPUTATION Right 02/21/2015   Procedure: Right 3rd Ray Amputation;  Surgeon: Newt Minion, MD;  Location: [facility];  Service: Orthopedics;  Laterality: Right;  Digital block and ankle block with MAC  . CARDIAC CATHETERIZATION  1990s  . CATARACT EXTRACTION W/ INTRAOCULAR LENS  IMPLANT, BILATERAL    . EYE SURGERY     cataracts bilateral  . INTRAOPERATIVE TRANSTHORACIC ECHOCARDIOGRAM  09/14/2018   Procedure: INTRAOPERATIVE TRANSTHORACIC ECHOCARDIOGRAM;  Surgeon: Sherren Mocha, MD;  Location: [facility];  Service: Open Heart Surgery;;  . RIGHT/LEFT HEART CATH AND CORONARY ANGIOGRAPHY N/A 07/21/2018   Procedure: RIGHT/LEFT HEART CATH AND CORONARY ANGIOGRAPHY;  Surgeon: Sherren Mocha, MD;  Location: [facility];  Service: Cardiovascular;  Laterality: N/A;  . SKIN  CANCER EXCISION  12/2011   forehead  . TRANSCATHETER AORTIC VALVE REPLACEMENT, TRANSFEMORAL N/A 09/14/2018   Procedure: TRANSCATHETER AORTIC VALVE REPLACEMENT, TRANSFEMORAL. 56m EDWARDS SAPIEN3 THV.;  Surgeon: CSherren Mocha MD;  Location: [facility]  Service: Open Heart Surgery;  Laterality: N/A;    Family History  Problem Relation Age of Onset  . Heart disease Mother   . Malignant hyperthermia Mother   . Hypertension Mother   . Heart disease Father   . Malignant hyperthermia Father   .  Hyperlipidemia Father   . Hypertension Father   . Heart disease Sister   . Hypertension Sister     Allergies  Allergen Reactions  . Levofloxacin Rash    "thought they were sticking swords thru me"  . Klor-Con [Potassium Chloride Er] Diarrhea    Current Outpatient Medications on File Prior to Visit  Medication Sig Dispense Refill  . atorvastatin (LIPITOR) 10 MG tablet Take 10 mg by mouth at bedtime.    . Calcium-Vitamin D (CALTRATE 600 PLUS-VIT D PO) Take 1 tablet by mouth at bedtime.     . folic acid (FOLVITE) 1 MG tablet Take 1 mg by mouth daily.     . furosemide (LASIX) 40 MG tablet Take 80 mg by mouth daily.    Marland Kitchen guaiFENesin (MUCINEX) 600 MG 12 hr tablet Take 600-1,200 mg by mouth 2 (two) times daily as needed (cold/congestion.).    Marland Kitchen methotrexate (RHEUMATREX) 2.5 MG tablet Take 10 mg by mouth once a week. Caution:Chemotherapy. Protect from light.    . metolazone (ZAROXOLYN) 2.5 MG tablet Take 1 tablet (2.5 mg total) by mouth once a week. TAKE ON TUESDAYS. 30 tablet 1  . metoprolol succinate (TOPROL XL) 25 MG 24 hr tablet Take 12.5 mg by mouth daily.    . Multiple Vitamin (MULTIVITAMIN WITH MINERALS) TABS tablet Take 1 tablet by mouth daily.    . nitroGLYCERIN (NITROSTAT) 0.4 MG SL tablet DISSOLVE 1 TABLET UNDER THE TONGUE EVERY 5 MINUTES AS NEEDED FOR CHEST PAIN 25 tablet 4  . potassium chloride SA (KLOR-CON) 20 MEQ tablet Take 2 tablets (40 mEq total) by mouth every morning. 180 tablet 3  .  Probiotic Product (HEALTHY COLON PO) Take 1 capsule by mouth daily.    . tamsulosin (FLOMAX) 0.4 MG CAPS capsule Take 1 capsule (0.4 mg total) by mouth every evening. 90 capsule 3  . warfarin (COUMADIN) 5 MG tablet Take 1 tablet daily except take 1 1/2 tablets Mon Wed and Fridays or Take as directed by anticoagulation clinic 120 tablet 1   No current facility-administered medications on file prior to visit.    BP 110/60   Pulse 69   Temp 97.6 F (36.4 C) (Other (Comment))   Ht 6' (1.829 m)   Wt 181 lb (82.1 kg)   SpO2 100%   BMI 24.55 kg/m       Objective:   Physical Exam Vitals and nursing note reviewed.  Constitutional:      Appearance: Normal appearance.  HENT:     Mouth/Throat:     Mouth: Mucous membranes are moist.  Cardiovascular:     Rate and Rhythm: Normal rate and regular rhythm.     Pulses: Normal pulses.     Heart sounds: Normal heart sounds.  Pulmonary:     Effort: Pulmonary effort is normal.     Breath sounds: Normal breath sounds.  Abdominal:     General: Abdomen is flat. Bowel sounds are normal.     Palpations: Abdomen is soft.  Genitourinary:    Rectum: Guaiac result negative.  Skin:    General: Skin is warm and dry.     Capillary Refill: Capillary refill takes less than 2 seconds.     Coloration: Skin is not jaundiced or pale.     Findings: No bruising, erythema or lesion.  Neurological:     General: No focal deficit present.     Mental Status: He is alert and oriented to person, place, and time.  Psychiatric:  Mood and Affect: Mood normal.        Behavior: Behavior normal.        Thought Content: Thought content normal.        Judgment: Judgment normal.       Assessment & Plan:  1. Diarrhea, unspecified type -He was advised to stop Imodium and see if diarrhea returns.  If it does return then restart at half a tab of Imodium every 6-8 hours and follow-up with me. - CBC with Differential/Platelet; Future - Basic Metabolic Panel;  Future  2. Left hip pain - Reviewed Xray - negative for fracture - Bear weight as tolerated - Fall precautions reviewed   3. Function kidney decreased - Encouraged to stay well hydrated - CBC with Differential/Platelet; Future - Basic Metabolic Panel; Future   *After Claron's visit I spoke with his daughter [name] who is his DPR.  She informed me that he continues to have diarrhea.  He also is not eating or drinking much throughout the day, she has brought him Pedialyte which he enjoys so he is drinking more this.  I advised her that she could add Prilosec to his regimen.  He is coming back next week for Coumadin check.  We will follow-up at that time  [name], NP

## 2019-12-22 ENCOUNTER — Other Ambulatory Visit: Payer: Self-pay

## 2019-12-22 ENCOUNTER — Other Ambulatory Visit: Payer: Self-pay | Admitting: Internal Medicine

## 2019-12-22 ENCOUNTER — Telehealth: Payer: Self-pay | Admitting: *Deleted

## 2019-12-22 DIAGNOSIS — Z7189 Other specified counseling: Secondary | ICD-10-CM

## 2019-12-22 DIAGNOSIS — Z515 Encounter for palliative care: Secondary | ICD-10-CM | POA: Diagnosis not present

## 2019-12-22 NOTE — Progress Notes (Signed)
Jan 28th, 2021 [facility] Palliative Care Consult Note Telephone: [phone]  Fax: [phone]  [name] NAME: [name] [name] DOB: 1925-10-03 MRN: [id number]  PRIMARY CARE PROVIDER:   [name], NP DR. Liane Comber (cardiologist) Hip spine metastasis.  Dr. Meda Coffee  DR Lestine Mount, Dr. Auburn Bilberry (otolaryngology) 11/14/2019 eval for tonsillar mass seen on PET scan. Biopsy benign Dr. Sullivan Lone (Oncology) (seen for melanoma; 11/02/2019) REFERRING PROVIDER:  [name], NP [address],  [place] [address]  RESPONSIBLE PARTY: (dtr) Erline Hau [phone]. (W) [phone] [id number].  ASSESSMENT / RECOMMENDATIONS:  1. Advance Care Planning: A. Directives: Discussed with [name]. In the presence of his daughter [name] [name] and S-I-L, we discussed use of CPR in the event of a Cardiopulmonary Arrest. He's not certain of his final decision. We also discussed aspects of the MOST form.  I left a copy of this form, along with associated [name] education material. B. Goals of Care: To stay in his home. Remain as healthy as he can  2. Symptom Management: -Diarrhea: Improved. Last BM was last evening; described as very loose, but none since. Is supplementing with Pedialyte. -Left hip pain; intermittent when first gets up, then resolves. A F/U X-ray was neg for fracture. Family purchased a lift chair some months ago and this has helped. -Skin cancer (malignant melanoma lower right back). Recommended treatment would entail excision under anesthesia. When he underwent anesthesia in the past, difficulty recovery. [name]'s cardiologist recommended against any further surgeries.  -Elevated INR (8) thought r/t inconsistent diet. Reheck scheduled in 1 week with PCP.  3. Cognitive / Functional status: A & O x 3. Consistent use of rollator walker within home; WC outside. Increasing difficulty with LE movement. PT in the past with improvement.  Able to feed himself.Current weight is 181lbs. At a height of 6' his BMI is 24.55 kg/m2. Notes some LE swelling..  -Ask office of PCP for PT/OT refereal   4. Family / Community Supports:  [name] lives in his own home. His wife [name] [name]) currently residing in a skilled nursing facility ([facility]). He's been married 87 yrs. He has 4 daughters who keep a close eye on him. 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 Family private pays for [name] [name] to clean on Tuesdays, and her daughter [name] [name], who is also an EMT, comes in q night except for Sat night. [name] [name] lives close by. [name]'s daughters alternate coming by every weekend. They prepare the meals for the week.  Lives in home by himself. Married x 70 yr.  [facility] [name] [name] with Alzheimer's . 4 daughters.  [name] is the youngest. Social research officer, government.   5. Follow up Palliative Care Visit: Family will call me on a prn basis, specifically when they wish to complete MOST form.  I spent 60 minutes providing this consultation from 2pm to 3pm. More than 50% of the time in this consultation was spent coordinating communication.   HISTORY OF PRESENT ILLNESS:  [name] MARTINZ is a 86 y.o. male with  h/o  recently dignosed (Dec 2020 Dr Irene Limbo) malignant melanoma R lower back. PET scan possible R 11th rib metastasis vs granulation. Areas of focal hypermetabolic uptake each leg; infection/inflammation but cutaneous/subcutaneous neoplasm not excluded. L oral pharynx bypermetabolic area (f/u bx benign). L4 lesion worrisome for metastatic dz. *Melanoma typically metastasizes to nearest lymph nodes-not what was found on TET/CT. Plan rpt PRT/CT March, with labs. MD visit in 14 weeks. Also h/o atrial fibrillation (coumadin),  CHF, CAD, degenerative disc disease (cervical), GERD, HLD, HTN, hypothyroidism, rheumatoid arthritis, aortic valve replacement, forehead skin cancer, stroke ([place]), and urgency incontinence. -12/07/2019 ER for diarrhea, eval for fall few days earlier, dizziness,  dark stools.  Palliative Care was asked to help address goals of care.   CODE STATUS: Full Code  PPS: 50% HOSPICE ELIGIBILITY/DIAGNOSIS: TBD  PAST MEDICAL HISTORY:  Past Medical History:  Diagnosis Date  . Atrial fibrillation, chronic (HCC)    a. on coumadin   . Carotid artery occlusion    right total internal artery occlusion  . Chronic diastolic CHF (congestive heart failure) ([place])   . Coronary artery disease    s/p cardiac cath in 1990s, and cardiolite study  in 2005 showing EF 54%  . Degenerative disc disease    cervical spine  . GERD (gastroesophageal reflux disease)   . HIV infection ([place])   . Hyperlipidemia   . Hypertension   . Hypothyroidism   . Rheumatoid arthritis ([place])   . S/P TAVR (transcatheter aortic valve replacement)    Edwards Sapien 3 THV (size 29 mm) via the R TF approach   . Skin cancer    forehead  . Stroke [facility])    TIA   . TIA (transient ischemic attack)   . Urgency incontinence     SOCIAL HX:  Social History   Tobacco Use  . Smoking status: Former Smoker    Packs/day: 1.00    Years: 40.00    Pack years: 40.00    Types: Cigarettes    Quit date: 09/12/1989    Years since quitting: 30.2  . Smokeless tobacco: Never Used  . Tobacco comment: quit around 1992  Substance Use Topics  . Alcohol use: No    Alcohol/week: 0.0 standard drinks    Comment: former, quit drinking in 1992    ALLERGIES:  Allergies  Allergen Reactions  . Levofloxacin Rash    "thought they were sticking swords thru me"  . Klor-Con [Potassium Chloride Er] Diarrhea     PERTINENT MEDICATIONS:  Outpatient Encounter Medications as of 12/22/2019  Medication Sig  . atorvastatin (LIPITOR) 10 MG tablet Take 10 mg by mouth at bedtime.  . Calcium-Vitamin D (CALTRATE 600 PLUS-VIT D PO) Take 1 tablet by mouth at bedtime.   . folic acid (FOLVITE) 1 MG tablet Take 1 mg by mouth daily.   . furosemide (LASIX) 40 MG tablet Take 80 mg by mouth daily.  Marland Kitchen guaiFENesin (MUCINEX) 600 MG  12 hr tablet Take 600-1,200 mg by mouth 2 (two) times daily as needed (cold/congestion.).  Marland Kitchen methotrexate (RHEUMATREX) 2.5 MG tablet Take 10 mg by mouth once a week. Caution:Chemotherapy. Protect from light.  . metolazone (ZAROXOLYN) 2.5 MG tablet Take 1 tablet (2.5 mg total) by mouth once a week. TAKE ON TUESDAYS.  Marland Kitchen metoprolol succinate (TOPROL XL) 25 MG 24 hr tablet Take 12.5 mg by mouth daily.  . Multiple Vitamin (MULTIVITAMIN WITH MINERALS) TABS tablet Take 1 tablet by mouth daily.  . nitroGLYCERIN (NITROSTAT) 0.4 MG SL tablet DISSOLVE 1 TABLET UNDER THE TONGUE EVERY 5 MINUTES AS NEEDED FOR CHEST PAIN  . potassium chloride SA (KLOR-CON) 20 MEQ tablet Take 2 tablets (40 mEq total) by mouth every morning.  . Probiotic Product (HEALTHY COLON PO) Take 1 capsule by mouth daily.  . tamsulosin (FLOMAX) 0.4 MG CAPS capsule Take 1 capsule (0.4 mg total) by mouth every evening.  . warfarin (COUMADIN) 5 MG tablet Take 1 tablet daily except take 1 1/2 tablets  Mon Wed and Fridays or Take as directed by anticoagulation clinic   No facility-administered encounter medications on file as of 12/22/2019.    PHYSICAL EXAM:   General: NAD, frail appearing, thin, sitting up in his recliner lift chair. Daughter and S-I-L in attendance.  PE limited to decreased COVID exposure Ext: no pedal edema Skin: no rashes exposed skin area Neurological: Weakness but otherwise non-focal  Julianne Handler, NP

## 2019-12-22 NOTE — Telephone Encounter (Signed)
Contacted by Dr. Jarvis Newcomer (dermatologist) office [[facility]ani]. Dr. Rozann Lesches would like to have copy of last office visit note. Faxed copy of 12/9 note to [phone] as requested

## 2019-12-25 ENCOUNTER — Encounter: Payer: Self-pay | Admitting: Internal Medicine

## 2019-12-27 ENCOUNTER — Other Ambulatory Visit: Payer: Self-pay | Admitting: Adult Health

## 2019-12-27 ENCOUNTER — Telehealth: Payer: Self-pay | Admitting: *Deleted

## 2019-12-27 ENCOUNTER — Other Ambulatory Visit: Payer: Self-pay

## 2019-12-27 ENCOUNTER — Telehealth: Payer: Self-pay | Admitting: Adult Health

## 2019-12-27 DIAGNOSIS — R2681 Unsteadiness on feet: Secondary | ICD-10-CM

## 2019-12-27 NOTE — Telephone Encounter (Signed)
Mailed last OV note from 11/02/2019(Dr.Kale) to Dr. Jarvis Newcomer, [phone], [place]. Ph# [phone] following request from Dani/office support. Attempted fax [phone] - fax failed [phone].

## 2019-12-27 NOTE — Telephone Encounter (Signed)
[facility] laboratory faxed over a Cardiac Risk Assessment form on 11/30/19. They called to verify that [name] [name] has received it.  Phone: [phone] Fax: [phone]

## 2019-12-28 ENCOUNTER — Encounter: Payer: Self-pay | Admitting: Adult Health

## 2019-12-28 ENCOUNTER — Ambulatory Visit (INDEPENDENT_AMBULATORY_CARE_PROVIDER_SITE_OTHER): Payer: Medicare HMO | Admitting: Adult Health

## 2019-12-28 ENCOUNTER — Ambulatory Visit (INDEPENDENT_AMBULATORY_CARE_PROVIDER_SITE_OTHER): Payer: Medicare HMO | Admitting: General Practice

## 2019-12-28 VITALS — BP 116/82 | HR 78 | Temp 98.6°F

## 2019-12-28 DIAGNOSIS — R197 Diarrhea, unspecified: Secondary | ICD-10-CM

## 2019-12-28 DIAGNOSIS — Z7901 Long term (current) use of anticoagulants: Secondary | ICD-10-CM

## 2019-12-28 DIAGNOSIS — N289 Disorder of kidney and ureter, unspecified: Secondary | ICD-10-CM

## 2019-12-28 LAB — POCT INR: INR: 2.2 (ref 2.0–3.0)

## 2019-12-28 NOTE — Progress Notes (Signed)
Subjective:    Patient ID: [name] [name], male    DOB: Aug 21, 1925, 86 y.o.   MRN: [id number]  HPI 86 year old male who  has a past medical history of Atrial fibrillation, chronic ([place]), Carotid artery occlusion, Chronic diastolic CHF (congestive heart failure) ([place]), Coronary artery disease, Degenerative disc disease, GERD (gastroesophageal reflux disease), HIV infection ([place]), Hyperlipidemia, Hypertension, Hypothyroidism, Rheumatoid arthritis ([place]), S/P TAVR (transcatheter aortic valve replacement), Skin cancer, Stroke ([place]), TIA (transient ischemic attack), and Urgency incontinence.  He is presenting to the office today for 2-week follow-up regarding diarrhea.  He was last seen he reported that he had not had any diarrhea since the morning.  He had not noticed any dark stools but that his stool continue to be soft.  When I spoke to his daughter [name] she reported that the patient continued to have episodes of diarrhea and at which time she was advised to start him on over-the-counter Pepcid.  Today he reports that he believes that the Pepcid is working he may have 2 episodes of diarrhea a week but that the rest of his stools continue to be loose.  He denies abdominal pain or cramping and has not noticed that he had a fever or chills.  Speaking to his daughter [name] during this visit over the phone he reports the same thing to her that he continues to have loose stools and intermittent diarrhea but believes that the Pepcid is working.  He is trying to stay hydrated supplementing with water and Pedialyte.  Denies any dark stool  When he was recently seen in the ER on 12/07/2019 for the complaint of dizziness, diarrhea, and dark stools his kidney function was decreased.  During the last was unable to obtain labs as her lab was closed.    Lab Results  Component Value Date   CREATININE 1.55 (H) 12/07/2019   CREATININE 1.40 (H) 12/07/2019   BUN 25 (H) 12/07/2019   BUN 23 12/07/2019   NA 144  12/07/2019   NA 143 12/07/2019   K 3.3 (L) 12/07/2019   K 3.2 (L) 12/07/2019   CL 105 12/07/2019   CL 104 12/07/2019   CO2 30 12/07/2019    Review of Systems See HPI   Past Medical History:  Diagnosis Date  . Atrial fibrillation, chronic (HCC)    a. on coumadin   . Carotid artery occlusion    right total internal artery occlusion  . Chronic diastolic CHF (congestive heart failure) ([place])   . Coronary artery disease    s/p cardiac cath in 1990s, and cardiolite study  in 2005 showing EF 54%  . Degenerative disc disease    cervical spine  . GERD (gastroesophageal reflux disease)   . HIV infection ([place])   . Hyperlipidemia   . Hypertension   . Hypothyroidism   . Rheumatoid arthritis ([place])   . S/P TAVR (transcatheter aortic valve replacement)    Edwards Sapien 3 THV (size 29 mm) via the R TF approach   . Skin cancer    forehead  . Stroke [facility])    TIA   . TIA (transient ischemic attack)   . Urgency incontinence     Social History   Socioeconomic History  . Marital status: Married    Spouse name: Not on file  . Number of children: Not on file  . Years of education: Not on file  . Highest education level: Not on file  Occupational History  . Not on file  Tobacco  Use  . Smoking status: Former Smoker    Packs/day: 1.00    Years: 40.00    Pack years: 40.00    Types: Cigarettes    Quit date: 09/12/1989    Years since quitting: 30.3  . Smokeless tobacco: Never Used  . Tobacco comment: quit around 1992  Substance and Sexual Activity  . Alcohol use: No    Alcohol/week: 0.0 standard drinks    Comment: former, quit drinking in 1992  . Drug use: No  . Sexual activity: Not Currently  Other Topics Concern  . 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  Difficulty of Paying Living Expenses: Not hard at all  Food Insecurity: No Food Insecurity  . Worried About Charity fundraiser in the Last Year: Never true  . Ran Out of Food in the Last Year: Never true  Transportation Needs: No Transportation Needs  . Lack of Transportation (Medical): No  . Lack of Transportation (Non-Medical): No  Physical Activity:   . Days of Exercise per Week: Not on file  . Minutes of Exercise per Session: Not on file  Stress:   . Feeling of Stress : Not on file  Social Connections:   . Frequency of Communication with Friends and Family: Not on file  . Frequency of Social Gatherings with Friends and Family: Not on file  . Attends Religious Services: Not on file  . Active Member of Clubs or Organizations: Not on file  . Attends Archivist Meetings: Not on file  . Marital Status: Not on file  Intimate Partner Violence:   . Fear of Current or Ex-Partner: Not on file  . Emotionally Abused: Not on file  . Physically Abused: Not on file  . Sexually Abused: Not on file    Past Surgical History:  Procedure Laterality Date  . AMPUTATION Right 02/21/2015   Procedure: Right 3rd Ray Amputation;  Surgeon: Newt Minion, MD;  Location: [facility];  Service: Orthopedics;  Laterality: Right;  Digital block and ankle block with MAC  . CARDIAC CATHETERIZATION  1990s  . CATARACT EXTRACTION W/ INTRAOCULAR LENS  IMPLANT, BILATERAL    . EYE SURGERY     cataracts bilateral  . INTRAOPERATIVE TRANSTHORACIC ECHOCARDIOGRAM  09/14/2018   Procedure: INTRAOPERATIVE TRANSTHORACIC ECHOCARDIOGRAM;  Surgeon: Sherren Mocha, MD;  Location: [facility];  Service: Open Heart Surgery;;  . RIGHT/LEFT HEART CATH AND CORONARY ANGIOGRAPHY N/A 07/21/2018   Procedure: RIGHT/LEFT HEART CATH AND CORONARY ANGIOGRAPHY;  Surgeon: Sherren Mocha, MD;  Location: [facility] CV LAB;  Service: Cardiovascular;  Laterality: N/A;  . SKIN CANCER EXCISION  12/2011   forehead  . TRANSCATHETER AORTIC VALVE  REPLACEMENT, TRANSFEMORAL N/A 09/14/2018   Procedure: TRANSCATHETER AORTIC VALVE REPLACEMENT, TRANSFEMORAL. 74m EDWARDS SAPIEN3 THV.;  Surgeon: CSherren Mocha MD;  Location: [facility]  Service: Open Heart Surgery;  Laterality: N/A;    Family History  Problem Relation Age of Onset  . Heart disease Mother   . Malignant hyperthermia Mother   . Hypertension Mother   . Heart disease Father   . Malignant hyperthermia Father   . Hyperlipidemia Father   . Hypertension Father   . Heart disease Sister   . Hypertension Sister  Allergies  Allergen Reactions  . Levofloxacin Rash    "thought they were sticking swords thru me"  . Klor-Con [Potassium Chloride Er] Diarrhea    Current Outpatient Medications on File Prior to Visit  Medication Sig Dispense Refill  . atorvastatin (LIPITOR) 10 MG tablet Take 10 mg by mouth at bedtime.    . Calcium-Vitamin D (CALTRATE 600 PLUS-VIT D PO) Take 1 tablet by mouth at bedtime.     . folic acid (FOLVITE) 1 MG tablet Take 1 mg by mouth daily.     . furosemide (LASIX) 40 MG tablet Take 80 mg by mouth daily.    Marland Kitchen guaiFENesin (MUCINEX) 600 MG 12 hr tablet Take 600-1,200 mg by mouth 2 (two) times daily as needed (cold/congestion.).    Marland Kitchen methotrexate (RHEUMATREX) 2.5 MG tablet Take 10 mg by mouth once a week. Caution:Chemotherapy. Protect from light.    . metolazone (ZAROXOLYN) 2.5 MG tablet Take 1 tablet (2.5 mg total) by mouth once a week. TAKE ON TUESDAYS. 30 tablet 1  . metoprolol succinate (TOPROL XL) 25 MG 24 hr tablet Take 12.5 mg by mouth daily.    . Multiple Vitamin (MULTIVITAMIN WITH MINERALS) TABS tablet Take 1 tablet by mouth daily.    . nitroGLYCERIN (NITROSTAT) 0.4 MG SL tablet DISSOLVE 1 TABLET UNDER THE TONGUE EVERY 5 MINUTES AS NEEDED FOR CHEST PAIN 25 tablet 4  . potassium chloride SA (KLOR-CON) 20 MEQ tablet Take 2 tablets (40 mEq total) by mouth every morning. 180 tablet 3  . Probiotic Product (HEALTHY COLON PO) Take 1 capsule by mouth daily.     . tamsulosin (FLOMAX) 0.4 MG CAPS capsule Take 1 capsule (0.4 mg total) by mouth every evening. 90 capsule 3  . warfarin (COUMADIN) 5 MG tablet Take 1 tablet daily except take 1 1/2 tablets Mon Wed and Fridays or Take as directed by anticoagulation clinic 120 tablet 1   No current facility-administered medications on file prior to visit.    BP 116/82   Pulse 78   Temp 98.6 F (37 C)       Objective:   Physical Exam Vitals and nursing note reviewed.  Constitutional:      Appearance: Normal appearance.  Cardiovascular:     Rate and Rhythm: Normal rate and regular rhythm.     Pulses: Normal pulses.     Heart sounds: Normal heart sounds.  Pulmonary:     Effort: Pulmonary effort is normal.     Breath sounds: Normal breath sounds.  Abdominal:     General: Abdomen is flat. Bowel sounds are normal.     Palpations: Abdomen is soft.  Skin:    General: Skin is warm and dry.     Capillary Refill: Capillary refill takes less than 2 seconds.  Neurological:     General: No focal deficit present.     Mental Status: He is alert and oriented to person, place, and time.     Motor: Weakness present.     Gait: Gait abnormal.     Comments: Has a slow steady gait with rolling walker  Psychiatric:        Mood and Affect: Mood normal.        Behavior: Behavior normal.        Thought Content: Thought content normal.        Judgment: Judgment normal.       Assessment & Plan:  1. Function kidney decreased  - CBC with Differential/Platelet - Basic Metabolic Panel  2.  Diarrhea, unspecified type -Advised his daughter that this is likely not infectious diarrhea.  I will have him start taking probiotics, he likes activity a yogurt so his daughter will make sure that he has some in the refrigerator.  Continue with Pepcid and make sure he is staying hydrated.  His daughter will follow up with me in the next couple weeks.  [name], NP

## 2019-12-28 NOTE — Progress Notes (Signed)
I have reviewed the results and agree with this plan   

## 2019-12-28 NOTE — Patient Instructions (Addendum)
Pre visit review using our clinic review tool, if applicable. No additional management support is needed unless otherwise documented below in the visit note.  Change dosage and take 1 tablet daily except 1/2 tablet on Monday and Fridays.  Re-check in 2 weeks.   Daughter, [name] has been advised of dosing change [phone].

## 2019-12-28 NOTE — Telephone Encounter (Signed)
I do not remember seeing this 

## 2019-12-28 NOTE — Telephone Encounter (Signed)
Did you receive this?

## 2019-12-29 LAB — BASIC METABOLIC PANEL
BUN: 16 mg/dL (ref 6–23)
CO2: 31 mEq/L (ref 19–32)
Calcium: 8.5 mg/dL (ref 8.4–10.5)
Chloride: 103 mEq/L (ref 96–112)
Creatinine, Ser: 1.42 mg/dL (ref 0.40–1.50)
GFR: 46.58 mL/min — ABNORMAL LOW (ref >60.00)
Glucose, Bld: 66 mg/dL — ABNORMAL LOW (ref 70–99)
Potassium: 3.2 mEq/L — ABNORMAL LOW (ref 3.5–5.1)
Sodium: 139 mEq/L (ref 135–145)

## 2019-12-29 LAB — CBC WITH DIFFERENTIAL/PLATELET
Basophils Absolute: 0.1 10*3/uL (ref 0.0–0.1)
Basophils Relative: 0.8 % (ref 0.0–3.0)
Eosinophils Absolute: 0.3 10*3/uL (ref 0.0–0.7)
Eosinophils Relative: 3.2 % (ref 0.0–5.0)
HCT: 37.3 % — ABNORMAL LOW (ref 39.0–52.0)
Hemoglobin: 12.5 g/dL — ABNORMAL LOW (ref 13.0–17.0)
Lymphocytes Relative: 17.3 % (ref 12.0–46.0)
Lymphs Abs: 1.7 10*3/uL (ref 0.7–4.0)
MCHC: 33.6 g/dL (ref 30.0–36.0)
MCV: 94.4 fl (ref 78.0–100.0)
Monocytes Absolute: 1.2 10*3/uL — ABNORMAL HIGH (ref 0.1–1.0)
Monocytes Relative: 12.3 % — ABNORMAL HIGH (ref 3.0–12.0)
Neutro Abs: 6.4 10*3/uL (ref 1.4–7.7)
Neutrophils Relative %: 66.4 % (ref 43.0–77.0)
Platelets: 176 10*3/uL (ref 150.0–400.0)
RBC: 3.95 Mil/uL — ABNORMAL LOW (ref 4.22–5.81)
RDW: 15.6 % — ABNORMAL HIGH (ref 11.5–15.5)
WBC: 9.6 10*3/uL (ref 4.0–10.5)

## 2019-12-29 NOTE — Telephone Encounter (Signed)
Noted! Will have them fax it

## 2019-12-30 ENCOUNTER — Emergency Department (HOSPITAL_COMMUNITY): Payer: Medicare HMO

## 2019-12-30 ENCOUNTER — Inpatient Hospital Stay (HOSPITAL_COMMUNITY)
Admission: EM | Admit: 2019-12-30 | Discharge: 2020-01-03 | DRG: 872 | Disposition: A | Discharge status: Skilled Nursing Facility | Payer: Medicare HMO | Attending: Internal Medicine | Admitting: Internal Medicine

## 2019-12-30 DIAGNOSIS — Z8673 Personal history of transient ischemic attack (TIA), and cerebral infarction without residual deficits: Secondary | ICD-10-CM

## 2019-12-30 DIAGNOSIS — N400 Enlarged prostate without lower urinary tract symptoms: Secondary | ICD-10-CM | POA: Diagnosis present

## 2019-12-30 DIAGNOSIS — Z85828 Personal history of other malignant neoplasm of skin: Secondary | ICD-10-CM

## 2019-12-30 DIAGNOSIS — I130 Hypertensive heart and chronic kidney disease with heart failure and stage 1 through stage 4 chronic kidney disease, or unspecified chronic kidney disease: Secondary | ICD-10-CM | POA: Diagnosis present

## 2019-12-30 DIAGNOSIS — Z6824 Body mass index (BMI) 24.0-24.9, adult: Secondary | ICD-10-CM

## 2019-12-30 DIAGNOSIS — R627 Adult failure to thrive: Secondary | ICD-10-CM | POA: Diagnosis present

## 2019-12-30 DIAGNOSIS — I4891 Unspecified atrial fibrillation: Secondary | ICD-10-CM | POA: Diagnosis present

## 2019-12-30 DIAGNOSIS — D631 Anemia in chronic kidney disease: Secondary | ICD-10-CM | POA: Diagnosis present

## 2019-12-30 DIAGNOSIS — Z89431 Acquired absence of right foot: Secondary | ICD-10-CM

## 2019-12-30 DIAGNOSIS — Z21 Asymptomatic human immunodeficiency virus [HIV] infection status: Secondary | ICD-10-CM | POA: Diagnosis present

## 2019-12-30 DIAGNOSIS — I10 Essential (primary) hypertension: Secondary | ICD-10-CM | POA: Diagnosis present

## 2019-12-30 DIAGNOSIS — Z952 Presence of prosthetic heart valve: Secondary | ICD-10-CM

## 2019-12-30 DIAGNOSIS — Z79899 Other long term (current) drug therapy: Secondary | ICD-10-CM

## 2019-12-30 DIAGNOSIS — C4359 Malignant melanoma of other part of trunk: Secondary | ICD-10-CM | POA: Diagnosis present

## 2019-12-30 DIAGNOSIS — K219 Gastro-esophageal reflux disease without esophagitis: Secondary | ICD-10-CM | POA: Diagnosis present

## 2019-12-30 DIAGNOSIS — Z8249 Family history of ischemic heart disease and other diseases of the circulatory system: Secondary | ICD-10-CM

## 2019-12-30 DIAGNOSIS — I959 Hypotension, unspecified: Secondary | ICD-10-CM

## 2019-12-30 DIAGNOSIS — E785 Hyperlipidemia, unspecified: Secondary | ICD-10-CM | POA: Diagnosis present

## 2019-12-30 DIAGNOSIS — N182 Chronic kidney disease, stage 2 (mild): Secondary | ICD-10-CM | POA: Diagnosis present

## 2019-12-30 DIAGNOSIS — Z8349 Family history of other endocrine, nutritional and metabolic diseases: Secondary | ICD-10-CM

## 2019-12-30 DIAGNOSIS — I5022 Chronic systolic (congestive) heart failure: Secondary | ICD-10-CM | POA: Diagnosis present

## 2019-12-30 DIAGNOSIS — A0472 Enterocolitis due to Clostridium difficile, not specified as recurrent: Secondary | ICD-10-CM | POA: Diagnosis present

## 2019-12-30 DIAGNOSIS — R197 Diarrhea, unspecified: Secondary | ICD-10-CM | POA: Diagnosis present

## 2019-12-30 DIAGNOSIS — N3941 Urge incontinence: Secondary | ICD-10-CM | POA: Diagnosis present

## 2019-12-30 DIAGNOSIS — E039 Hypothyroidism, unspecified: Secondary | ICD-10-CM | POA: Diagnosis present

## 2019-12-30 DIAGNOSIS — Z888 Allergy status to other drugs, medicaments and biological substances status: Secondary | ICD-10-CM

## 2019-12-30 DIAGNOSIS — I2510 Atherosclerotic heart disease of native coronary artery without angina pectoris: Secondary | ICD-10-CM | POA: Diagnosis present

## 2019-12-30 DIAGNOSIS — D696 Thrombocytopenia, unspecified: Secondary | ICD-10-CM | POA: Diagnosis present

## 2019-12-30 DIAGNOSIS — Z20822 Contact with and (suspected) exposure to covid-19: Secondary | ICD-10-CM | POA: Diagnosis present

## 2019-12-30 DIAGNOSIS — N179 Acute kidney failure, unspecified: Secondary | ICD-10-CM | POA: Diagnosis present

## 2019-12-30 DIAGNOSIS — I482 Chronic atrial fibrillation, unspecified: Secondary | ICD-10-CM | POA: Diagnosis present

## 2019-12-30 DIAGNOSIS — Z87891 Personal history of nicotine dependence: Secondary | ICD-10-CM

## 2019-12-30 DIAGNOSIS — A419 Sepsis, unspecified organism: Principal | ICD-10-CM | POA: Diagnosis present

## 2019-12-30 DIAGNOSIS — M069 Rheumatoid arthritis, unspecified: Secondary | ICD-10-CM | POA: Diagnosis present

## 2019-12-30 DIAGNOSIS — E876 Hypokalemia: Secondary | ICD-10-CM | POA: Diagnosis present

## 2019-12-30 DIAGNOSIS — Z881 Allergy status to other antibiotic agents status: Secondary | ICD-10-CM

## 2019-12-30 DIAGNOSIS — R001 Bradycardia, unspecified: Secondary | ICD-10-CM | POA: Diagnosis present

## 2019-12-30 DIAGNOSIS — E860 Dehydration: Secondary | ICD-10-CM | POA: Diagnosis present

## 2019-12-30 DIAGNOSIS — Z7901 Long term (current) use of anticoagulants: Secondary | ICD-10-CM

## 2019-12-30 DIAGNOSIS — E2740 Unspecified adrenocortical insufficiency: Secondary | ICD-10-CM | POA: Diagnosis present

## 2019-12-30 LAB — COMPREHENSIVE METABOLIC PANEL
ALT: 10 U/L (ref 0–44)
AST: 23 U/L (ref 15–41)
Albumin: 2.5 g/dL — ABNORMAL LOW (ref 3.5–5.0)
Alkaline Phosphatase: 48 U/L (ref 38–126)
Anion gap: 7 (ref 5–15)
BUN: 17 mg/dL (ref 8–23)
CO2: 27 mmol/L (ref 22–32)
Calcium: 8.1 mg/dL — ABNORMAL LOW (ref 8.9–10.3)
Chloride: 103 mmol/L (ref 98–111)
Creatinine, Ser: 1.6 mg/dL — ABNORMAL HIGH (ref 0.61–1.24)
GFR calc Af Amer: 43 mL/min — ABNORMAL LOW (ref >60)
GFR calc non Af Amer: 37 mL/min — ABNORMAL LOW (ref >60)
Glucose, Bld: 122 mg/dL — ABNORMAL HIGH (ref 70–99)
Potassium: 3.3 mmol/L — ABNORMAL LOW (ref 3.5–5.1)
Sodium: 137 mmol/L (ref 135–145)
Total Bilirubin: 1.4 mg/dL — ABNORMAL HIGH (ref 0.3–1.2)
Total Protein: 5.3 g/dL — ABNORMAL LOW (ref 6.5–8.1)

## 2019-12-30 LAB — LACTIC ACID, PLASMA: Lactic Acid, Venous: 1.6 mmol/L (ref 0.5–1.9)

## 2019-12-30 LAB — URINALYSIS, ROUTINE W REFLEX MICROSCOPIC
Bilirubin Urine: NEGATIVE
Glucose, UA: NEGATIVE mg/dL
Hgb urine dipstick: NEGATIVE
Ketones, ur: NEGATIVE mg/dL
Leukocytes,Ua: NEGATIVE
Nitrite: NEGATIVE
Protein, ur: 30 mg/dL — AB
Specific Gravity, Urine: 1.023 (ref 1.005–1.030)
pH: 5 (ref 5.0–8.0)

## 2019-12-30 LAB — CBC
HCT: 36.8 % — ABNORMAL LOW (ref 39.0–52.0)
Hemoglobin: 12.1 g/dL — ABNORMAL LOW (ref 13.0–17.0)
MCH: 31.6 pg (ref 26.0–34.0)
MCHC: 32.9 g/dL (ref 30.0–36.0)
MCV: 96.1 fL (ref 80.0–100.0)
Platelets: 119 10*3/uL — ABNORMAL LOW (ref 150–400)
RBC: 3.83 MIL/uL — ABNORMAL LOW (ref 4.22–5.81)
RDW: 15.5 % (ref 11.5–15.5)
WBC: 18.7 10*3/uL — ABNORMAL HIGH (ref 4.0–10.5)
nRBC: 0 % (ref 0.0–0.2)

## 2019-12-30 LAB — POC SARS CORONAVIRUS 2 AG -  ED: SARS Coronavirus 2 Ag: NEGATIVE

## 2019-12-30 MED ORDER — ACETAMINOPHEN 325 MG PO TABS
650.0000 mg | ORAL_TABLET | Freq: Once | ORAL | Status: AC
  Administered 2019-12-30: 650 mg via ORAL
  Filled 2019-12-30: qty 2

## 2019-12-30 MED ORDER — SODIUM CHLORIDE 0.9 % IV BOLUS
250.0000 mL | Freq: Once | INTRAVENOUS | Status: AC
  Administered 2019-12-30: 250 mL via INTRAVENOUS

## 2019-12-30 MED ORDER — METRONIDAZOLE IN NACL 5-0.79 MG/ML-% IV SOLN
500.0000 mg | Freq: Once | INTRAVENOUS | Status: AC
  Administered 2019-12-30: 500 mg via INTRAVENOUS
  Filled 2019-12-30: qty 100

## 2019-12-30 MED ORDER — SODIUM CHLORIDE 0.9 % IV SOLN
2.0000 g | Freq: Once | INTRAVENOUS | Status: AC
  Administered 2019-12-30: 2 g via INTRAVENOUS
  Filled 2019-12-30: qty 20

## 2019-12-30 MED ORDER — SODIUM CHLORIDE 0.9% FLUSH
3.0000 mL | Freq: Once | INTRAVENOUS | Status: AC
  Administered 2019-12-30: 3 mL via INTRAVENOUS

## 2019-12-30 NOTE — ED Notes (Signed)
[phone] pts daughter [name], wants an update

## 2019-12-30 NOTE — ED Provider Notes (Signed)
[facility] Provider Note   CSN: [id number] Arrival date & time: 12/30/19  1937     History Chief Complaint  Patient presents with  . Weakness    [name] [name] is a 86 y.o. male.  HPI 86 year old male presents with weakness and low BP. Patient has had diarrhea "too many times" for the past couple days. He feels weak, and today was having a hard time walking. Was dizzy like he was going to pass out. No abdominal pain, chest pain or cough. No fever.    Past Medical History:  Diagnosis Date  . Atrial fibrillation, chronic (HCC)    a. on coumadin   . Carotid artery occlusion    right total internal artery occlusion  . Chronic diastolic CHF (congestive heart failure) ([place])   . Coronary artery disease    s/p cardiac cath in 1990s, and cardiolite study  in 2005 showing EF 54%  . Degenerative disc disease    cervical spine  . GERD (gastroesophageal reflux disease)   . HIV infection ([place])   . Hyperlipidemia   . Hypertension   . Hypothyroidism   . Rheumatoid arthritis ([place])   . S/P TAVR (transcatheter aortic valve replacement)    Edwards Sapien 3 THV (size 29 mm) via the R TF approach   . Skin cancer    forehead  . Stroke [facility])    TIA   . TIA (transient ischemic attack)   . Urgency incontinence     Patient Active Problem List   Diagnosis Date Noted  . Swelling 07/29/2019  . S/P TAVR (transcatheter aortic valve replacement)   . Urinary frequency 06/10/2018  . Lower respiratory infection 12/18/2017  . Iron deficiency anemia 12/16/2016  . Severe aortic stenosis 04/10/2015  . Chronic anticoagulation 02/17/2015  . Bilateral carotid artery disease ([place]) 09/12/2014  . Acute on chronic diastolic heart failure ([place]) 07/10/2014  . Hypothyroidism 07/04/2013  . Occlusion and stenosis of carotid artery without mention of cerebral infarction 05/18/2012  . COPD with asthma ([place]) 01/26/2012  . Long term (current) use of anticoagulants  02/24/2011  . Atrial fibrillation ([place]) 01/12/2009  . HLD (hyperlipidemia) 06/15/2007  . Benign essential HTN 06/15/2007    Past Surgical History:  Procedure Laterality Date  . AMPUTATION Right 02/21/2015   Procedure: Right 3rd Ray Amputation;  Surgeon: Newt Minion, MD;  Location: [facility];  Service: Orthopedics;  Laterality: Right;  Digital block and ankle block with MAC  . CARDIAC CATHETERIZATION  1990s  . CATARACT EXTRACTION W/ INTRAOCULAR LENS  IMPLANT, BILATERAL    . EYE SURGERY     cataracts bilateral  . INTRAOPERATIVE TRANSTHORACIC ECHOCARDIOGRAM  09/14/2018   Procedure: INTRAOPERATIVE TRANSTHORACIC ECHOCARDIOGRAM;  Surgeon: Sherren Mocha, MD;  Location: [facility];  Service: Open Heart Surgery;;  . RIGHT/LEFT HEART CATH AND CORONARY ANGIOGRAPHY N/A 07/21/2018   Procedure: RIGHT/LEFT HEART CATH AND CORONARY ANGIOGRAPHY;  Surgeon: Sherren Mocha, MD;  Location: [facility] CV LAB;  Service: Cardiovascular;  Laterality: N/A;  . SKIN CANCER EXCISION  12/2011   forehead  . TRANSCATHETER AORTIC VALVE REPLACEMENT, TRANSFEMORAL N/A 09/14/2018   Procedure: TRANSCATHETER AORTIC VALVE REPLACEMENT, TRANSFEMORAL. 17m EDWARDS SAPIEN3 THV.;  Surgeon: CSherren Mocha MD;  Location: [facility]  Service: Open Heart Surgery;  Laterality: N/A;       Family History  Problem Relation Age of Onset  . Heart disease Mother   . Malignant hyperthermia Mother   . Hypertension Mother   . Heart  disease Father   . Malignant hyperthermia Father   . Hyperlipidemia Father   . Hypertension Father   . Heart disease Sister   . Hypertension Sister     Social History   Tobacco Use  . Smoking status: Former Smoker    Packs/day: 1.00    Years: 40.00    Pack years: 40.00    Types: Cigarettes    Quit date: 09/12/1989    Years since quitting: 30.3  . Smokeless tobacco: Never Used  . Tobacco comment: quit around 1992  Substance Use Topics  . Alcohol use: No    Alcohol/week: 0.0 standard drinks    Comment:  former, quit drinking in 1992  . Drug use: No    Home Medications Prior to Admission medications   Medication Sig Start Date End Date Taking? Authorizing Provider  atorvastatin (LIPITOR) 10 MG tablet Take 10 mg by mouth at bedtime.    [provider]  Calcium-Vitamin D (CALTRATE 600 PLUS-VIT D PO) Take 1 tablet by mouth at bedtime.     [provider]  folic acid (FOLVITE) 1 MG tablet Take 1 mg by mouth daily.  03/06/16   [provider]  furosemide (LASIX) 40 MG tablet Take 80 mg by mouth daily.    [provider]  guaiFENesin (MUCINEX) 600 MG 12 hr tablet Take 600-1,200 mg by mouth 2 (two) times daily as needed (cold/congestion.).    [provider]  methotrexate (RHEUMATREX) 2.5 MG tablet Take 10 mg by mouth once a week. Caution:Chemotherapy. Protect from light.    [provider]  metolazone (ZAROXOLYN) 2.5 MG tablet Take 1 tablet (2.5 mg total) by mouth once a week. TAKE ON TUESDAYS. 12/14/19   Dorothy Spark, MD  metoprolol succinate (TOPROL XL) 25 MG 24 hr tablet Take 12.5 mg by mouth daily.    [provider]  Multiple Vitamin (MULTIVITAMIN WITH MINERALS) TABS tablet Take 1 tablet by mouth daily.    [provider]  nitroGLYCERIN (NITROSTAT) 0.4 MG SL tablet DISSOLVE 1 TABLET UNDER THE TONGUE EVERY 5 MINUTES AS NEEDED FOR CHEST PAIN 06/23/19   Dorothy Spark, MD  potassium chloride SA (KLOR-CON) 20 MEQ tablet Take 2 tablets (40 mEq total) by mouth every morning. 12/20/19   Dorothy Spark, MD  Probiotic Product (HEALTHY COLON PO) Take 1 capsule by mouth daily.    [provider]  tamsulosin (FLOMAX) 0.4 MG CAPS capsule Take 1 capsule (0.4 mg total) by mouth every evening. 05/11/19   Dorothy Spark, MD  warfarin (COUMADIN) 5 MG tablet Take 1 tablet daily except take 1 1/2 tablets Mon Wed and Fridays or Take as directed by anticoagulation clinic 12/08/19   [name], NP    Allergies      Levofloxacin and Klor-con [potassium chloride er]  Review of Systems   Review of Systems  Constitutional: Negative for fever.  Respiratory: Negative for shortness of breath.   Cardiovascular: Negative for chest pain.  Gastrointestinal: Positive for diarrhea. Negative for abdominal pain and vomiting.  Neurological: Positive for weakness and light-headedness.  All other systems reviewed and are negative.   Physical Exam Updated Vital Signs BP (!) 97/54   Pulse 78   Temp (!) 100.7 F (38.2 C) (Rectal)   Resp 16   SpO2 96%   Physical Exam Vitals and nursing note reviewed.  Constitutional:      General: He is not in acute distress.    Appearance: He is well-developed. He is  not diaphoretic.  HENT:     Head: Normocephalic and atraumatic.     Right Ear: External ear normal.     Left Ear: External ear normal.     Nose: Nose normal.  Eyes:     General:        Right eye: No discharge.        Left eye: No discharge.  Cardiovascular:     Rate and Rhythm: Normal rate and regular rhythm.     Heart sounds: Normal heart sounds.  Pulmonary:     Effort: Pulmonary effort is normal. No tachypnea or accessory muscle usage.     Breath sounds: Normal breath sounds.  Abdominal:     Palpations: Abdomen is soft.     Tenderness: There is no abdominal tenderness.  Musculoskeletal:     Cervical back: Neck supple.     Right lower leg: Edema present.     Left lower leg: Edema present.  Skin:    General: Skin is warm and dry.  Neurological:     Mental Status: He is alert.  Psychiatric:        Mood and Affect: Mood is not anxious.     ED Results / Procedures / Treatments   Labs (all labs ordered are listed, but only abnormal results are displayed) Labs Reviewed  COMPREHENSIVE METABOLIC PANEL - Abnormal; Notable for the following components:      Result Value   Potassium 3.3 (*)    Glucose, Bld 122 (*)    Creatinine, Ser 1.60 (*)    Calcium 8.1 (*)    Total Protein 5.3 (*)     Albumin 2.5 (*)    Total Bilirubin 1.4 (*)    GFR calc non Af Amer 37 (*)    GFR calc Af Amer 43 (*)    All other components within normal limits  CBC - Abnormal; Notable for the following components:   WBC 18.7 (*)    RBC 3.83 (*)    Hemoglobin 12.1 (*)    HCT 36.8 (*)    Platelets 119 (*)    All other components within normal limits  GI PATHOGEN PANEL BY PCR, STOOL  C DIFFICILE QUICK SCREEN W PCR REFLEX  CULTURE, BLOOD (ROUTINE X 2)  CULTURE, BLOOD (ROUTINE X 2)  RESPIRATORY PANEL BY RT PCR (FLU A&B, COVID)  URINALYSIS, ROUTINE W REFLEX MICROSCOPIC  LACTIC ACID, PLASMA  LACTIC ACID, PLASMA  POC SARS CORONAVIRUS 2 AG -  ED    EKG None  Radiology DG Chest Portable 1 View  Result Date: 12/30/2019 CLINICAL DATA:  Generalized weakness. EXAM: PORTABLE CHEST 1 VIEW COMPARISON:  12/07/2019 FINDINGS: 8:03 p.m. The cardio pericardial silhouette is enlarged. Interstitial markings are diffusely coarsened with chronic features. The lungs are clear without focal pneumonia, edema, pneumothorax or pleural effusion. The visualized bony structures of the thorax are intact. Lucency superimposed on the right hemidiaphragm is probably bowel gas given similarity to the prior study. IMPRESSION: 1. Cardiomegaly without acute cardiopulmonary findings. 2. Lucency over the right hemidiaphragm is probably within bowel, but two-view abdomen recommended to exclude intraperitoneal free air. Electronically Signed   By: Misty Stanley M.D.   On: 12/30/2019 20:20   DG Abd 2 Views  Result Date: 12/30/2019 CLINICAL DATA:  Diarrhea, abdominal distension and discomfort EXAM: ABDOMEN - 2 VIEW COMPARISON:  CT abdomen pelvis 12/07/2019 FINDINGS: Diffuse gaseous distention of the bowel throughout the abdomen. No visible subdiaphragmatic free air. Vascular calcifications project over the right kidney and  pelvis. Moderate bilateral hip arthrosis. Streaky opacities in the lung bases, likely atelectasis. Aortic valve stent  graft repair is noted. Thoracic aortic calcifications seen as well. IMPRESSION: Diffuse gaseous distention of the bowel throughout the abdomen, nonspecific and similar to comparison CT though early obstruction with active distal decompression could present with these findings. No visible subdiaphragmatic free air. Extensive vascular calcium. Electronically Signed   By: Lovena Le M.D.   On: 12/30/2019 21:52    Procedures .Critical Care Performed by: Sherwood Gambler, MD Authorized by: Sherwood Gambler, MD   Critical care provider statement:    Critical care time (minutes):  30   Critical care time was exclusive of:  Separately billable procedures and treating other patients   Critical care was necessary to treat or prevent imminent or life-threatening deterioration of the following conditions:  Sepsis   Critical care was time spent personally by me on the following activities:  Discussions with consultants, evaluation of patient's response to treatment, examination of patient, ordering and performing treatments and interventions, ordering and review of laboratory studies, ordering and review of radiographic studies, pulse oximetry, re-evaluation of patient's condition, obtaining history from patient or surrogate and review of old charts   (including critical care time)  Medications Ordered in ED Medications  metroNIDAZOLE (FLAGYL) IVPB 500 mg (500 mg Intravenous New Bag/Given 12/30/19 2236)  sodium chloride flush (NS) 0.9 % injection 3 mL (3 mLs Intravenous Given 12/30/19 2010)  sodium chloride 0.9 % bolus 250 mL (0 mLs Intravenous Stopped 12/30/19 2236)  acetaminophen (TYLENOL) tablet 650 mg (650 mg Oral Given 12/30/19 2239)  cefTRIAXone (ROCEPHIN) 2 g in sodium chloride 0.9 % 100 mL IVPB (2 g Intravenous New Bag/Given 12/30/19 2238)  sodium chloride 0.9 % bolus 250 mL (0 mLs Intravenous Stopped 12/30/19 2238)    ED Course  I have reviewed the triage vital signs and the nursing notes.  Pertinent  labs & imaging results that were available during my care of the patient were reviewed by me and considered in my medical decision making (see chart for details).    MDM Rules/Calculators/A&P                      Patient appears to be volume down.  He is a difficult case because of his significant heart failure with chronic volume problems.  Given small boluses of fluids.  Map is currently good and will continue to bolus as needed.  Was found to have a rectal temp of 100.7 and given broad antibiotics to cover for intra-abdominal infection.  He has no pneumonialike symptoms with a clear chest x-ray.  Urine is currently pending but the Rocephin given should cover this.  Covid testing pending.  Care to Dr. Sedonia Small with CT pending and admission needed.  CORNEY KNIGHTON was evaluated in Emergency Department on 12/30/2019 for the symptoms described in the history of present illness. He was evaluated in the context of the global COVID-19 pandemic, which necessitated consideration that the patient might be at risk for infection with the SARS-CoV-2 virus that causes COVID-19. Institutional protocols and algorithms that pertain to the evaluation of patients at risk for COVID-19 are in a state of rapid change based on information released by regulatory bodies including the CDC and federal and state organizations. These policies and algorithms were followed during the patient's care in the ED.  Final Clinical Impression(s) / ED Diagnoses Final diagnoses:  Diarrhea    Rx / DC Orders ED Discharge Orders  None       Sherwood Gambler, MD 12/30/19 2350

## 2019-12-30 NOTE — ED Triage Notes (Signed)
Pt arrives via [facility] from home, lives alone at home. Increasing generalized weakness for about 4 weeks. Has been evaluated for the same. Denies pain. Family member says the pt has been "slower" to respond in the past week. He is a/o and hard of hearing. Normally ambulatory with walker, but having increased difficulty over several weeks with the same. 116/56, afib 80's. Pedal edema.

## 2019-12-31 ENCOUNTER — Emergency Department (HOSPITAL_COMMUNITY): Payer: Medicare HMO

## 2019-12-31 DIAGNOSIS — E785 Hyperlipidemia, unspecified: Secondary | ICD-10-CM | POA: Diagnosis present

## 2019-12-31 DIAGNOSIS — C4359 Malignant melanoma of other part of trunk: Secondary | ICD-10-CM | POA: Diagnosis present

## 2019-12-31 DIAGNOSIS — E2740 Unspecified adrenocortical insufficiency: Secondary | ICD-10-CM | POA: Diagnosis present

## 2019-12-31 DIAGNOSIS — A419 Sepsis, unspecified organism: Secondary | ICD-10-CM | POA: Diagnosis present

## 2019-12-31 DIAGNOSIS — I5022 Chronic systolic (congestive) heart failure: Secondary | ICD-10-CM | POA: Diagnosis present

## 2019-12-31 DIAGNOSIS — R627 Adult failure to thrive: Secondary | ICD-10-CM | POA: Diagnosis present

## 2019-12-31 DIAGNOSIS — Z20822 Contact with and (suspected) exposure to covid-19: Secondary | ICD-10-CM | POA: Diagnosis present

## 2019-12-31 DIAGNOSIS — I130 Hypertensive heart and chronic kidney disease with heart failure and stage 1 through stage 4 chronic kidney disease, or unspecified chronic kidney disease: Secondary | ICD-10-CM | POA: Diagnosis present

## 2019-12-31 DIAGNOSIS — K219 Gastro-esophageal reflux disease without esophagitis: Secondary | ICD-10-CM | POA: Diagnosis present

## 2019-12-31 DIAGNOSIS — I4891 Unspecified atrial fibrillation: Secondary | ICD-10-CM

## 2019-12-31 DIAGNOSIS — Z21 Asymptomatic human immunodeficiency virus [HIV] infection status: Secondary | ICD-10-CM | POA: Diagnosis present

## 2019-12-31 DIAGNOSIS — N179 Acute kidney failure, unspecified: Secondary | ICD-10-CM

## 2019-12-31 DIAGNOSIS — N182 Chronic kidney disease, stage 2 (mild): Secondary | ICD-10-CM | POA: Diagnosis present

## 2019-12-31 DIAGNOSIS — N400 Enlarged prostate without lower urinary tract symptoms: Secondary | ICD-10-CM | POA: Diagnosis present

## 2019-12-31 DIAGNOSIS — M069 Rheumatoid arthritis, unspecified: Secondary | ICD-10-CM | POA: Diagnosis present

## 2019-12-31 DIAGNOSIS — D696 Thrombocytopenia, unspecified: Secondary | ICD-10-CM | POA: Diagnosis present

## 2019-12-31 DIAGNOSIS — Z881 Allergy status to other antibiotic agents status: Secondary | ICD-10-CM | POA: Diagnosis not present

## 2019-12-31 DIAGNOSIS — E860 Dehydration: Secondary | ICD-10-CM | POA: Diagnosis present

## 2019-12-31 DIAGNOSIS — E039 Hypothyroidism, unspecified: Secondary | ICD-10-CM | POA: Diagnosis present

## 2019-12-31 DIAGNOSIS — I4819 Other persistent atrial fibrillation: Secondary | ICD-10-CM | POA: Diagnosis not present

## 2019-12-31 DIAGNOSIS — I959 Hypotension, unspecified: Secondary | ICD-10-CM | POA: Diagnosis not present

## 2019-12-31 DIAGNOSIS — Z888 Allergy status to other drugs, medicaments and biological substances status: Secondary | ICD-10-CM | POA: Diagnosis not present

## 2019-12-31 DIAGNOSIS — D631 Anemia in chronic kidney disease: Secondary | ICD-10-CM | POA: Diagnosis present

## 2019-12-31 DIAGNOSIS — A0472 Enterocolitis due to Clostridium difficile, not specified as recurrent: Secondary | ICD-10-CM | POA: Diagnosis present

## 2019-12-31 DIAGNOSIS — R197 Diarrhea, unspecified: Secondary | ICD-10-CM

## 2019-12-31 DIAGNOSIS — I2510 Atherosclerotic heart disease of native coronary artery without angina pectoris: Secondary | ICD-10-CM | POA: Diagnosis present

## 2019-12-31 DIAGNOSIS — E876 Hypokalemia: Secondary | ICD-10-CM | POA: Diagnosis present

## 2019-12-31 DIAGNOSIS — I482 Chronic atrial fibrillation, unspecified: Secondary | ICD-10-CM | POA: Diagnosis present

## 2019-12-31 LAB — RESPIRATORY PANEL BY RT PCR (FLU A&B, COVID)
Influenza A by PCR: NEGATIVE
Influenza B by PCR: NEGATIVE
SARS Coronavirus 2 by RT PCR: NEGATIVE

## 2019-12-31 LAB — CBC
HCT: 33.2 % — ABNORMAL LOW (ref 39.0–52.0)
Hemoglobin: 11 g/dL — ABNORMAL LOW (ref 13.0–17.0)
MCH: 31.9 pg (ref 26.0–34.0)
MCHC: 33.1 g/dL (ref 30.0–36.0)
MCV: 96.2 fL (ref 80.0–100.0)
Platelets: 94 10*3/uL — ABNORMAL LOW (ref 150–400)
RBC: 3.45 MIL/uL — ABNORMAL LOW (ref 4.22–5.81)
RDW: 15.5 % (ref 11.5–15.5)
WBC: 13.3 10*3/uL — ABNORMAL HIGH (ref 4.0–10.5)
nRBC: 0 % (ref 0.0–0.2)

## 2019-12-31 LAB — PROTIME-INR
INR: 3.5 — ABNORMAL HIGH (ref 0.8–1.2)
Prothrombin Time: 34.9 seconds — ABNORMAL HIGH (ref 11.4–15.2)

## 2019-12-31 LAB — COMPREHENSIVE METABOLIC PANEL
ALT: 12 U/L (ref 0–44)
AST: 21 U/L (ref 15–41)
Albumin: 2.1 g/dL — ABNORMAL LOW (ref 3.5–5.0)
Alkaline Phosphatase: 39 U/L (ref 38–126)
Anion gap: 9 (ref 5–15)
BUN: 18 mg/dL (ref 8–23)
CO2: 27 mmol/L (ref 22–32)
Calcium: 7.7 mg/dL — ABNORMAL LOW (ref 8.9–10.3)
Chloride: 101 mmol/L (ref 98–111)
Creatinine, Ser: 1.58 mg/dL — ABNORMAL HIGH (ref 0.61–1.24)
GFR calc Af Amer: 43 mL/min — ABNORMAL LOW (ref >60)
GFR calc non Af Amer: 37 mL/min — ABNORMAL LOW (ref >60)
Glucose, Bld: 109 mg/dL — ABNORMAL HIGH (ref 70–99)
Potassium: 3.2 mmol/L — ABNORMAL LOW (ref 3.5–5.1)
Sodium: 137 mmol/L (ref 135–145)
Total Bilirubin: 0.7 mg/dL (ref 0.3–1.2)
Total Protein: 4.5 g/dL — ABNORMAL LOW (ref 6.5–8.1)

## 2019-12-31 LAB — C DIFFICILE QUICK SCREEN W PCR REFLEX
C Diff antigen: NEGATIVE
C Diff toxin: POSITIVE — AB

## 2019-12-31 LAB — LACTIC ACID, PLASMA: Lactic Acid, Venous: 0.9 mmol/L (ref 0.5–1.9)

## 2019-12-31 LAB — CLOSTRIDIUM DIFFICILE BY PCR, REFLEXED: Toxigenic C. Difficile by PCR: POSITIVE — AB

## 2019-12-31 MED ORDER — SODIUM CHLORIDE 0.9 % IV SOLN
2.0000 g | INTRAVENOUS | Status: DC
  Administered 2019-12-31: 2 g via INTRAVENOUS
  Filled 2019-12-31: qty 20
  Filled 2019-12-31: qty 2

## 2019-12-31 MED ORDER — ONDANSETRON HCL 4 MG/2ML IJ SOLN: 4.0000 mg | Freq: Four times a day (QID) | INTRAMUSCULAR | Status: DC | PRN

## 2019-12-31 MED ORDER — POTASSIUM CHLORIDE CRYS ER 20 MEQ PO TBCR
40.0000 meq | EXTENDED_RELEASE_TABLET | Freq: Once | ORAL | Status: DC
  Filled 2019-12-31: qty 2

## 2019-12-31 MED ORDER — VANCOMYCIN 50 MG/ML ORAL SOLUTION
125.0000 mg | Freq: Four times a day (QID) | ORAL | Status: DC
  Administered 2019-12-31 – 2020-01-03 (×14): 125 mg via ORAL
  Filled 2019-12-31 (×16): qty 2.5

## 2019-12-31 MED ORDER — FOLIC ACID 1 MG PO TABS
1.0000 mg | ORAL_TABLET | Freq: Every day | ORAL | Status: DC
  Administered 2019-12-31 – 2020-01-03 (×4): 1 mg via ORAL
  Filled 2019-12-31 (×4): qty 1

## 2019-12-31 MED ORDER — TAMSULOSIN HCL 0.4 MG PO CAPS
0.4000 mg | ORAL_CAPSULE | Freq: Every evening | ORAL | Status: DC
  Administered 2019-12-31 – 2020-01-03 (×4): 0.4 mg via ORAL
  Filled 2019-12-31 (×5): qty 1

## 2019-12-31 MED ORDER — SODIUM CHLORIDE 0.9 % IV BOLUS
250.0000 mL | Freq: Once | INTRAVENOUS | Status: AC
  Administered 2019-12-31: 250 mL via INTRAVENOUS

## 2019-12-31 MED ORDER — WARFARIN - PHARMACIST DOSING INPATIENT: Freq: Every day | Status: DC

## 2019-12-31 MED ORDER — ATORVASTATIN CALCIUM 10 MG PO TABS
10.0000 mg | ORAL_TABLET | Freq: Every day | ORAL | Status: DC
  Administered 2019-12-31 – 2020-01-02 (×3): 10 mg via ORAL
  Filled 2019-12-31 (×3): qty 1

## 2019-12-31 MED ORDER — HYDROCORTISONE NA SUCCINATE PF 100 MG IJ SOLR
50.0000 mg | Freq: Three times a day (TID) | INTRAMUSCULAR | Status: DC
  Administered 2019-12-31 – 2020-01-01 (×4): 50 mg via INTRAVENOUS
  Filled 2019-12-31 (×4): qty 2

## 2019-12-31 MED ORDER — POTASSIUM CHLORIDE 10 MEQ/100ML IV SOLN
10.0000 meq | Freq: Once | INTRAVENOUS | Status: AC
  Administered 2019-12-31: 10 meq via INTRAVENOUS
  Filled 2019-12-31: qty 100

## 2019-12-31 MED ORDER — HYDROCORTISONE NA SUCCINATE PF 100 MG IJ SOLR
100.0000 mg | INTRAMUSCULAR | Status: AC
  Administered 2019-12-31: 100 mg via INTRAVENOUS
  Filled 2019-12-31: qty 2

## 2019-12-31 MED ORDER — ACETAMINOPHEN 650 MG RE SUPP: 650.0000 mg | Freq: Four times a day (QID) | RECTAL | Status: DC | PRN

## 2019-12-31 MED ORDER — SACCHAROMYCES BOULARDII 250 MG PO CAPS
250.0000 mg | ORAL_CAPSULE | Freq: Two times a day (BID) | ORAL | Status: DC
  Administered 2019-12-31 – 2020-01-03 (×7): 250 mg via ORAL
  Filled 2019-12-31 (×7): qty 1

## 2019-12-31 MED ORDER — METRONIDAZOLE IN NACL 5-0.79 MG/ML-% IV SOLN
500.0000 mg | Freq: Three times a day (TID) | INTRAVENOUS | Status: DC
  Administered 2019-12-31 – 2020-01-01 (×4): 500 mg via INTRAVENOUS
  Filled 2019-12-31 (×4): qty 100

## 2019-12-31 MED ORDER — ONDANSETRON HCL 4 MG PO TABS: 4.0000 mg | ORAL_TABLET | Freq: Four times a day (QID) | ORAL | Status: DC | PRN

## 2019-12-31 MED ORDER — ACETAMINOPHEN 325 MG PO TABS: 650.0000 mg | ORAL_TABLET | Freq: Four times a day (QID) | ORAL | Status: DC | PRN

## 2019-12-31 NOTE — ED Notes (Signed)
Been with pt. Since 2345, noted that Pt. Was bradycardic when sleeping at 0000000, and BP systolic between 83 and [id number]. Consulted with MD and will continue to monitor. Ran another ekg

## 2019-12-31 NOTE — Plan of Care (Signed)
86 yo male comes from home lives alone admitted with hypotension, diarrhea, low-grade temp with history of chronic atrial fibrillation rheumatoid arthritis on methotrexate systolic heart failure with EF 20 to 25%. His blood pressure has improved after IV fluids.  He is on Lasix and beta-blocker at home which is on hold.  He has not had any diarrhea last night or today. So stool samples are not sent yet. He is on Rocephin and azithromycin empirically. CT of the abdomen and pelvis did not show any active infection. Follow-up cultures. Discussed with his daughter who reported that he takes a lot of supplements at home including multiple prostate supplements, B12, no supplements, natural sleep supplements, osteo-Flex, immune modulating supplements, PQ Q, collagen, moringa, ashwagandha. On Coumadin for A. Fib. Patient is awake alert very pleasant.  Renal functions improving.  Will get PT consult .Marland Kitchen

## 2019-12-31 NOTE — ED Notes (Signed)
SDU  Breakfast ordered  

## 2019-12-31 NOTE — ED Provider Notes (Addendum)
  Provider Note MRN:  [id number]  Arrival date & time: 12/31/19    ED Course and Medical Decision Making  Assumed care from Dr. Verner Chol at shift change.  Fever, diarrhea, hypotension, history of severe CHF.  Concern for intra-abdominal sepsis, given IV antibiotics.  Providing intermittent boluses of 250 cc normal saline.  CT is without acute process.  Will admit to hospital service for further care.  .Critical Care Performed by: Maudie Flakes, MD Authorized by: Maudie Flakes, MD   Critical care provider statement:    Critical care time (minutes):  33   Critical care was necessary to treat or prevent imminent or life-threatening deterioration of the following conditions:  Sepsis   Critical care was time spent personally by me on the following activities:  Discussions with consultants, evaluation of patient's response to treatment, examination of patient, ordering and performing treatments and interventions, ordering and review of laboratory studies, ordering and review of radiographic studies, pulse oximetry, re-evaluation of patient's condition, obtaining history from patient or surrogate and review of old charts   I assumed direction of critical care for this patient from another provider in my specialty: yes      Final Clinical Impressions(s) / ED Diagnoses     ICD-10-CM   1. Sepsis, due to unspecified organism, unspecified whether acute organ dysfunction present ([place])  A41.9   2. Diarrhea  R19.7 DG Abd 2 Views    DG Abd 2 Views  3. Hypotension, unspecified hypotension type  I95.9     ED Discharge Orders    None      Discharge Instructions   None     Barth Kirks. [place] Small, [place] [email or web address]    Maudie Flakes, MD 12/31/19 [id number]    Maudie Flakes, MD 12/31/19 [phone]

## 2019-12-31 NOTE — ED Notes (Signed)
Visible check on pt. Pt. Stated that he was doing ok. Vitals continue to run same, continue to monitor pt. closely

## 2019-12-31 NOTE — ED Notes (Signed)
Restarted N/S

## 2019-12-31 NOTE — Progress Notes (Signed)
ANTICOAGULATION CONSULT NOTE - Initial Consult  Pharmacy Consult for Warfarin Indication: atrial fibrillation  Allergies  Allergen Reactions  . Levofloxacin Rash    "thought they were sticking swords thru me"  . Klor-Con [Potassium Chloride Er] Diarrhea    Patient Measurements:    Vital Signs: Temp: 100.7 F (38.2 C) (02/05 2007) Temp Source: Rectal (02/05 2007) BP: 87/50 (02/06 0230) Pulse Rate: 77 (02/06 0230)  Labs: Recent Labs    12/28/19 1612 12/30/19 2002 12/31/19 0124  HGB 12.5* 12.1*  --   HCT 37.3* 36.8*  --   PLT 176.0 119*  --   LABPROT  --   --  34.9*  INR  --   --  3.5*  CREATININE 1.42 1.60*  --     Estimated Creatinine Clearance: 32.3 mL/min (A) (by C-G formula based on SCr of 1.6 mg/dL (H)).   Medical History: Past Medical History:  Diagnosis Date  . Atrial fibrillation, chronic (HCC)    a. on coumadin   . Carotid artery occlusion    right total internal artery occlusion  . Chronic diastolic CHF (congestive heart failure) ([place])   . Coronary artery disease    s/p cardiac cath in 1990s, and cardiolite study  in 2005 showing EF 54%  . Degenerative disc disease    cervical spine  . GERD (gastroesophageal reflux disease)   . HIV infection ([place])   . Hyperlipidemia   . Hypertension   . Hypothyroidism   . Rheumatoid arthritis ([place])   . S/P TAVR (transcatheter aortic valve replacement)    Edwards Sapien 3 THV (size 29 mm) via the R TF approach   . Skin cancer    forehead  . Stroke [facility])    TIA   . TIA (transient ischemic attack)   . Urgency incontinence     Medications:  Awaiting electronic med rec  Assessment: 86 y.o. presents with fever/diarrhea - concern for intra-abd sepsis. Pt on warfarin PTA for afib. Home dose: 2.5mg  Mon and Fri and 5mg  all other days. Admission INR 3.5 (supratherapeutic). Plt down to 119.  Goal of Therapy:  INR 2-3 Monitor platelets by anticoagulation protocol: Yes   Plan:  Daily INR No coumadin  tonight  Sherlon Handing, PharmD, BCPS Please see amion for complete clinical pharmacist phone list 12/31/2019,2:50 AM

## 2019-12-31 NOTE — H&P (Signed)
History and Physical    [name] [name]:[id number] DOB: Apr 12, 1925 DOA: 12/30/2019  PCP: [name], NP  Patient coming from: Home  I have personally briefly reviewed patient's old medical records in [place]  Chief Complaint: Generalized weakness  HPI: [name] [name] is a 86 y.o. male with medical history significant of Chronic a.Fib, RA on MTX, HFrEF with EF 20-25%.  Pt developed diarrhea back in Jan, got better with immodium for a while until he was told to stop this med.  Saw PCP.  Didn't think it was infectious as of last PCP visit.  Today presents to ED with generalized weakness, low BP.  Having hard time walking today, dizzy and feeling like he was going to pass out.  No CP, cough, fever (subjectively), abd pain.   ED Course: Tm 100.7 WBC 18.7.  BP running in the 70s initially, improved to 29B-28U systolic after 132GM total bolus volume.  Creat 1.6, had been elevated with diarrhea since Jan, baseline 1.2 in Dec.   Review of Systems: As per HPI, otherwise all review of systems negative.  Past Medical History:  Diagnosis Date  . Atrial fibrillation, chronic (HCC)    a. on coumadin   . Carotid artery occlusion    right total internal artery occlusion  . Chronic diastolic CHF (congestive heart failure) ([place])   . Coronary artery disease    s/p cardiac cath in 1990s, and cardiolite study  in 2005 showing EF 54%  . Degenerative disc disease    cervical spine  . GERD (gastroesophageal reflux disease)   . HIV infection ([place])   . Hyperlipidemia   . Hypertension   . Hypothyroidism   . Rheumatoid arthritis ([place])   . S/P TAVR (transcatheter aortic valve replacement)    Edwards Sapien 3 THV (size 29 mm) via the R TF approach   . Skin cancer    forehead  . Stroke [facility])    TIA   . TIA (transient ischemic attack)   . Urgency incontinence     Past Surgical History:  Procedure Laterality Date  . AMPUTATION Right 02/21/2015   Procedure: Right 3rd Ray  Amputation;  Surgeon: Newt Minion, MD;  Location: [place];  Service: Orthopedics;  Laterality: Right;  Digital block and ankle block with MAC  . CARDIAC CATHETERIZATION  1990s  . CATARACT EXTRACTION W/ INTRAOCULAR LENS  IMPLANT, BILATERAL    . EYE SURGERY     cataracts bilateral  . INTRAOPERATIVE TRANSTHORACIC ECHOCARDIOGRAM  09/14/2018   Procedure: INTRAOPERATIVE TRANSTHORACIC ECHOCARDIOGRAM;  Surgeon: Sherren Mocha, MD;  Location: [place];  Service: Open Heart Surgery;;  . RIGHT/LEFT HEART CATH AND CORONARY ANGIOGRAPHY N/A 07/21/2018   Procedure: RIGHT/LEFT HEART CATH AND CORONARY ANGIOGRAPHY;  Surgeon: Sherren Mocha, MD;  Location: [place] [facility];  Service: Cardiovascular;  Laterality: N/A;  . SKIN CANCER EXCISION  12/2011   forehead  . TRANSCATHETER AORTIC VALVE REPLACEMENT, TRANSFEMORAL N/A 09/14/2018   Procedure: TRANSCATHETER AORTIC VALVE REPLACEMENT, TRANSFEMORAL. 71m EDWARDS SAPIEN3 THV.;  Surgeon: CSherren Mocha MD;  Location: [place]  Service: Open Heart Surgery;  Laterality: N/A;     reports that he quit smoking about 30 years ago. His smoking use included cigarettes. He has a 40.00 pack-year smoking history. He has never used smokeless tobacco. He reports that he does not drink alcohol or use drugs.  Allergies  Allergen Reactions  . Levofloxacin Rash    "thought they were sticking swords thru me"  . Klor-Con [Potassium Chloride  Er] Diarrhea    Family History  Problem Relation Age of Onset  . Heart disease Mother   . Malignant hyperthermia Mother   . Hypertension Mother   . Heart disease Father   . Malignant hyperthermia Father   . Hyperlipidemia Father   . Hypertension Father   . Heart disease Sister   . Hypertension Sister      Prior to Admission medications   Medication Sig Start Date End Date Taking? Authorizing Provider  atorvastatin (LIPITOR) 10 MG tablet Take 10 mg by mouth at bedtime.    [provider]  Calcium-Vitamin D (CALTRATE 600  PLUS-VIT D PO) Take 1 tablet by mouth at bedtime.     [provider]  folic acid (FOLVITE) 1 MG tablet Take 1 mg by mouth daily.  03/06/16   [provider]  furosemide (LASIX) 40 MG tablet Take 80 mg by mouth daily.    [provider]  guaiFENesin (MUCINEX) 600 MG 12 hr tablet Take 600-1,200 mg by mouth 2 (two) times daily as needed (cold/congestion.).    [provider]  methotrexate (RHEUMATREX) 2.5 MG tablet Take 10 mg by mouth once a week. Caution:Chemotherapy. Protect from light.    [provider]  metolazone (ZAROXOLYN) 2.5 MG tablet Take 1 tablet (2.5 mg total) by mouth once a week. TAKE ON TUESDAYS. 12/14/19   Dorothy Spark, MD  metoprolol succinate (TOPROL XL) 25 MG 24 hr tablet Take 12.5 mg by mouth daily.    [provider]  Multiple Vitamin (MULTIVITAMIN WITH MINERALS) TABS tablet Take 1 tablet by mouth daily.    [provider]  nitroGLYCERIN (NITROSTAT) 0.4 MG SL tablet DISSOLVE 1 TABLET UNDER THE TONGUE EVERY 5 MINUTES AS NEEDED FOR CHEST PAIN 06/23/19   Dorothy Spark, MD  potassium chloride SA (KLOR-CON) 20 MEQ tablet Take 2 tablets (40 mEq total) by mouth every morning. 12/20/19   Dorothy Spark, MD  Probiotic Product (HEALTHY COLON PO) Take 1 capsule by mouth daily.    [provider]  tamsulosin (FLOMAX) 0.4 MG CAPS capsule Take 1 capsule (0.4 mg total) by mouth every evening. 05/11/19   Dorothy Spark, MD  warfarin (COUMADIN) 5 MG tablet Take 1 tablet daily except take 1 1/2 tablets Mon Wed and Fridays or Take as directed by anticoagulation clinic 12/08/19   [name], NP    Physical Exam: Vitals:   12/31/19 0145 12/31/19 0200 12/31/19 0230 12/31/19 0245  BP: (!) 88/46 (!) 91/54 (!) 87/50 (!) 88/48  Pulse: (!) 57 (!) 56 77 63  Resp: 16 17 (!) 25 16  Temp:      TempSrc:      SpO2: 95% 98% 97% 96%    Constitutional: NAD, calm, comfortable Eyes: PERRL, lids and conjunctivae  normal ENMT: Mucous membranes are moist. Posterior pharynx clear of any exudate or lesions.Normal dentition.  Neck: normal, supple, no masses, no thyromegaly Respiratory: clear to auscultation bilaterally, no wheezing, no crackles. Normal respiratory effort. No accessory muscle use.  Cardiovascular: Regular rate and rhythm, no murmurs / rubs / gallops. No extremity edema. 2+ pedal pulses. No carotid bruits.  Abdomen: no tenderness, no masses palpated. No hepatosplenomegaly. Bowel sounds positive.  Musculoskeletal: no clubbing / cyanosis. No joint deformity upper and lower extremities. Good ROM, no contractures. Normal muscle tone.  Skin: no rashes, lesions, ulcers. No induration Neurologic: CN 2-12 grossly intact. Sensation intact, DTR normal. Strength 5/5 in all 4.  Psychiatric: Normal judgment and insight.  Alert and oriented x 3. Normal mood.    Labs on Admission: I have personally reviewed following labs and imaging studies  CBC: Recent Labs  Lab 12/28/19 1612 12/30/19 2002  WBC 9.6 18.7*  NEUTROABS 6.4  --   HGB 12.5* 12.1*  HCT 37.3* 36.8*  MCV 94.4 96.1  PLT 176.0 326*   Basic Metabolic Panel: Recent Labs  Lab 12/28/19 1612 12/30/19 2002  NA 139 137  K 3.2* 3.3*  CL 103 103  CO2 31 27  GLUCOSE 66* 122*  BUN 16 17  CREATININE 1.42 1.60*  CALCIUM 8.5 8.1*   GFR: Estimated Creatinine Clearance: 32.3 mL/min (A) (by C-G formula based on SCr of 1.6 mg/dL (H)). Liver Function Tests: Recent Labs  Lab 12/30/19 2002  AST 23  ALT 10  ALKPHOS 48  BILITOT 1.4*  PROT 5.3*  ALBUMIN 2.5*   No results for input(s): LIPASE, AMYLASE in the last 168 hours. No results for input(s): AMMONIA in the last 168 hours. Coagulation Profile: Recent Labs  Lab 12/28/19 0000 12/31/19 0124  INR 2.2 3.5*   Cardiac Enzymes: No results for input(s): CKTOTAL, CKMB, CKMBINDEX, TROPONINI in the last 168 hours. BNP (last 3 results) Recent Labs    02/07/19 1625  PROBNP 2,119*    HbA1C: No results for input(s): HGBA1C in the last 72 hours. CBG: No results for input(s): GLUCAP in the last 168 hours. Lipid Profile: No results for input(s): CHOL, HDL, LDLCALC, TRIG, CHOLHDL, LDLDIRECT in the last 72 hours. Thyroid Function Tests: No results for input(s): TSH, T4TOTAL, FREET4, T3FREE, THYROIDAB in the last 72 hours. Anemia Panel: No results for input(s): VITAMINB12, FOLATE, FERRITIN, TIBC, IRON, RETICCTPCT in the last 72 hours. Urine analysis:    Component Value Date/Time   COLORURINE AMBER (A) 12/30/2019 2339   APPEARANCEUR HAZY (A) 12/30/2019 2339   APPEARANCEUR Cloudy (A) 06/15/2018 1234   LABSPEC 1.023 12/30/2019 2339   PHURINE 5.0 12/30/2019 2339   GLUCOSEU NEGATIVE 12/30/2019 2339   HGBUR NEGATIVE 12/30/2019 2339   BILIRUBINUR NEGATIVE 12/30/2019 2339   BILIRUBINUR Negative 06/15/2018 [place] 12/30/2019 2339   PROTEINUR 30 (A) 12/30/2019 2339   UROBILINOGEN 0.2 05/18/2015 1420   UROBILINOGEN 0.2 05/19/2014 1626   NITRITE NEGATIVE 12/30/2019 2339   LEUKOCYTESUR NEGATIVE 12/30/2019 2339    Radiological Exams on Admission: CT ABDOMEN PELVIS WO CONTRAST  Result Date: 12/31/2019 CLINICAL DATA:  Worsening generalized weakness. Suspicion of bowel obstruction. EXAM: CT ABDOMEN AND PELVIS WITHOUT CONTRAST TECHNIQUE: Multidetector CT imaging of the abdomen and pelvis was performed following the standard protocol without IV contrast. COMPARISON:  12/07/2019 FINDINGS: Lower chest: Cardiomegaly. Previous aortic valve replacement. Lung bases are clear except for minimal dependent atelectasis on the right. Small chronic Bochdalek's hernia. Hepatobiliary: Negative. Subtle evidence of gallstones previously seen not specifically visible today. Pancreas: Normal.  Age related atrophic changes. Spleen: Normal Adrenals/Urinary Tract: No adrenal mass. Chronic renal atrophy. No sign of obstruction. No stone in the bladder. Stomach/Bowel: Moderate amount of gas  and fecal matter in the colon. No sign of bowel obstruction. Vascular/Lymphatic: Aortic atherosclerosis. No aneurysm. IVC is normal. No retroperitoneal adenopathy. Reproductive: Enlarged prostate. Other: Left inguinal hernia containing only fat. Small amount of fluid in the spermatic cord region on the left. Musculoskeletal: Chronic lower lumbar degenerative changes including degenerative anterolisthesis at L4-5 of 5 mm. IMPRESSION: No sign of bowel obstruction. Moderate amount of gas and fecal matter in the colon, but without an obstructive pattern. No acute bowel pathology  identifiable. Aortic atherosclerosis. Left inguinal hernia containing only fat. Small amount of fluid in the spermatic cord region. Enlarged prostate. Renal atrophy. Electronically Signed   By: Nelson Chimes M.D.   On: 12/31/2019 01:51   DG Chest Portable 1 View  Result Date: 12/30/2019 CLINICAL DATA:  Generalized weakness. EXAM: PORTABLE CHEST 1 VIEW COMPARISON:  12/07/2019 FINDINGS: 8:03 p.m. The cardio pericardial silhouette is enlarged. Interstitial markings are diffusely coarsened with chronic features. The lungs are clear without focal pneumonia, edema, pneumothorax or pleural effusion. The visualized bony structures of the thorax are intact. Lucency superimposed on the right hemidiaphragm is probably bowel gas given similarity to the prior study. IMPRESSION: 1. Cardiomegaly without acute cardiopulmonary findings. 2. Lucency over the right hemidiaphragm is probably within bowel, but two-view abdomen recommended to exclude intraperitoneal free air. Electronically Signed   By: Misty Stanley M.D.   On: 12/30/2019 20:20   DG Abd 2 Views  Result Date: 12/30/2019 CLINICAL DATA:  Diarrhea, abdominal distension and discomfort EXAM: ABDOMEN - 2 VIEW COMPARISON:  CT abdomen pelvis 12/07/2019 FINDINGS: Diffuse gaseous distention of the bowel throughout the abdomen. No visible subdiaphragmatic free air. Vascular calcifications project over the  right kidney and pelvis. Moderate bilateral hip arthrosis. Streaky opacities in the lung bases, likely atelectasis. Aortic valve stent graft repair is noted. Thoracic aortic calcifications seen as well. IMPRESSION: Diffuse gaseous distention of the bowel throughout the abdomen, nonspecific and similar to comparison CT though early obstruction with active distal decompression could present with these findings. No visible subdiaphragmatic free air. Extensive vascular calcium. Electronically Signed   By: Lovena Le M.D.   On: 12/30/2019 21:52    EKG: Independently reviewed.  Assessment/Plan Principal Problem:   Sepsis associated hypotension (HCC) Active Problems:   Benign essential HTN   Atrial fibrillation (HCC)   Diarrhea   Chronic systolic CHF (congestive heart failure) (HCC)   Malignant melanoma of skin of back (HCC)   Rheumatoid arthritis (HCC)   AKI (acute kidney injury) ([place])    1. Sepsis associated hypotension - 1. ? Infectious diarrhea, I see PCPs opinion of where diarrhea wasn't infectious; however, now patient has fever of 100.7 and WBC 18k 2. Empiric rocephin / azithro 3. Check C.Diff 4. Check GI pathogen pnl 5. IVF: 500cc bolus thus far in ED, see below discussion about adrenal insufficiency, will try steroids first. 6. Lactate nl 7. Patient not toxic appearing 8. Trying to minimize fluids 9. Will order another 250cc bolus over 1h to run with the potassium. 2. Apparent adrenal insufficiency - 1. Dont see this in the [place] or problem list section, but the FYI section of the patients chart has an "adrenal insufficiency" flag 2. Put on stress dose steroids empirically 3. Chronic systolic CHF - 1. Severe EF decrease 2. Holding diuretics and BP meds in setting of hypotension associated with SIRS 3. Hoping hypotension responds to steroids. 4. 500cc bolus thus far in ED 5. But will need more if he fails to respond quickly to steroids. 4. AKI - 1. Presumably due to dehydration  from the diarrhea / ongoing diuretic use. 2. Not really changed much since mid Jan though. 3. Hold diuretics 4. Got 500cc bolus in ED 5. More fluids TBD depending on how BP responds to steroids. 6. Strict intake and output 7. Repeat CMP in AM to see which way this is going 5. RA - 1. Holding MTX 6. A.Fib - 1. Continue coumadin 2. Holding metoprolol for the moment due to hypotension 3.  Tele monitor 7. Malignant melanoma of skin of back - 1. Apparently would require anesthesia to remove and patient not a good candidate for anesthesia (see pal care consult note from Jan). 8. Hypokalemia - 1. 1 run IV K to replace 2. Repeat in AM  DVT prophylaxis: Coumadin Code Status: Full Family Communication: No family in room Disposition Plan: Home after admit Consults called: none Admission status: Admit to inpatient  Severity of Illness: The appropriate patient status for this patient is INPATIENT. Inpatient status is judged to be reasonable and necessary in order to provide the required intensity of service to ensure the patient's safety. The patient's presenting symptoms, physical exam findings, and initial radiographic and laboratory data in the context of their chronic comorbidities is felt to place them at high risk for further clinical deterioration. Furthermore, it is not anticipated that the patient will be medically stable for discharge from the hospital within 2 midnights of admission. The following factors support the patient status of inpatient.   IP status due to sepsis associated with hypotension.  * I certify that at the point of admission it is my clinical judgment that the patient will require inpatient hospital care spanning beyond 2 midnights from the point of admission due to high intensity of service, high risk for further deterioration and high frequency of surveillance required.*    Quavon Keisling M. DO [facility]  How to contact the [facility] Attending or Consulting  provider [place] or covering provider during after hours [place], for this patient?  1. Check the care team in [facility] and look for a) attending/consulting [facility] provider listed and b) the [facility] team listed 2. Log into [email or web address]  Amion Physician Scheduling and messaging for groups and whole hospitals  On call and physician scheduling software for group practices, residents, hospitalists and other medical providers for call, clinic, rotation and shift schedules. On[employer] is a hospital-wide system for scheduling doctors and paging doctors on call. EasyPlot is for scientific plotting and data analysis.  [email or web address]  and use [place]'s universal password to access. If you do not have the password, please contact the hospital operator.  3. Locate the [facility] provider you are looking for under [facility] and page to a number that you can be directly reached. 4. If you still have difficulty reaching the provider, please page the [facility] (Director on Call) for the Hospitalists listed on amion for assistance.  12/31/2019, 3:06 AM

## 2020-01-01 DIAGNOSIS — I4819 Other persistent atrial fibrillation: Secondary | ICD-10-CM

## 2020-01-01 DIAGNOSIS — D849 Immunodeficiency, unspecified: Secondary | ICD-10-CM

## 2020-01-01 DIAGNOSIS — E876 Hypokalemia: Secondary | ICD-10-CM

## 2020-01-01 DIAGNOSIS — C4359 Malignant melanoma of other part of trunk: Secondary | ICD-10-CM

## 2020-01-01 LAB — BASIC METABOLIC PANEL
Anion gap: 8 (ref 5–15)
BUN: 20 mg/dL (ref 8–23)
CO2: 25 mmol/L (ref 22–32)
Calcium: 7.9 mg/dL — ABNORMAL LOW (ref 8.9–10.3)
Chloride: 103 mmol/L (ref 98–111)
Creatinine, Ser: 1.22 mg/dL (ref 0.61–1.24)
GFR calc Af Amer: 59 mL/min — ABNORMAL LOW (ref >60)
GFR calc non Af Amer: 51 mL/min — ABNORMAL LOW (ref >60)
Glucose, Bld: 142 mg/dL — ABNORMAL HIGH (ref 70–99)
Potassium: 3.1 mmol/L — ABNORMAL LOW (ref 3.5–5.1)
Sodium: 136 mmol/L (ref 135–145)

## 2020-01-01 LAB — PROTIME-INR
INR: 3.4 — ABNORMAL HIGH (ref 0.8–1.2)
Prothrombin Time: 34.6 seconds — ABNORMAL HIGH (ref 11.4–15.2)

## 2020-01-01 LAB — POTASSIUM: Potassium: 3.5 mmol/L (ref 3.5–5.1)

## 2020-01-01 MED ORDER — METRONIDAZOLE 500 MG PO TABS
500.0000 mg | ORAL_TABLET | Freq: Three times a day (TID) | ORAL | Status: DC
  Administered 2020-01-01 (×2): 500 mg via ORAL
  Filled 2020-01-01 (×3): qty 1

## 2020-01-01 MED ORDER — POTASSIUM CHLORIDE CRYS ER 20 MEQ PO TBCR
30.0000 meq | EXTENDED_RELEASE_TABLET | ORAL | Status: AC
  Administered 2020-01-01 (×2): 30 meq via ORAL
  Filled 2020-01-01 (×2): qty 1

## 2020-01-01 MED ORDER — POTASSIUM CHLORIDE CRYS ER 20 MEQ PO TBCR
40.0000 meq | EXTENDED_RELEASE_TABLET | Freq: Once | ORAL | Status: AC
  Administered 2020-01-01: 40 meq via ORAL
  Filled 2020-01-01: qty 2

## 2020-01-01 MED ORDER — HYDROCORTISONE NA SUCCINATE PF 100 MG IJ SOLR
50.0000 mg | Freq: Two times a day (BID) | INTRAMUSCULAR | Status: DC
  Administered 2020-01-01 – 2020-01-02 (×2): 50 mg via INTRAVENOUS
  Filled 2020-01-01 (×2): qty 2

## 2020-01-01 NOTE — Progress Notes (Addendum)
PROGRESS NOTE  WESTLEE WAYMENT [id number] DOB: 1925/06/19 DOA: 12/30/2019 PCP: [name], NP  Brief summary:  Pt developed diarrhea back in Jan, got better with immodium for a while until he was told to stop this med.  Saw PCP.  Didn't think it was infectious as of last PCP visit.  presents to ED with generalized weakness, low BP, fever 100.7.  Having hard time walking, dizzy and feeling like he was going to pass out.   HPI/Recap of past 24 hours:  Report diarrhea slowed down, he is feeling better, denies abdominal pain, no nausea, no vomiting  No fever  Assessment/Plan: Principal Problem:   Sepsis associated hypotension (HCC) Active Problems:   Benign essential HTN   Atrial fibrillation (HCC)   Diarrhea   Chronic systolic CHF (congestive heart failure) (HCC)   Malignant melanoma of skin of back (HCC)   Rheumatoid arthritis (HCC)   AKI (acute kidney injury) ([place])   cdiff colitis/sepsis -presents with fever 100.7, hypotension 77/45, tachypnea rr26, leukocytosis wbc 18.7, aki -flu and SARS-COV2 screening negative, blood culture no growth, urine culture pending collection, cxr no acute infiltrates, Ct ab/pel "Moderate amount of gas and fecal matter in the colon, but without an obstructive pattern. No acute bowel pathology identifiable." -he received ivf in the ED and on admission, will hold off ivf due to h/o chf -currently on oval vanc and flagyl - continue hold home meds toprolXL, lasix, metolazone due to sepsis/aki. -wbc and cr improved, he is started on stress dose steroid every 8 hours on admission, clinically he is improving, taper steroid  Hypokalemia: replace k, check mag  AKI on CKDII Cr has been elevated since having diarrhea a few weeks ago No obstructive nephropathy on ct ab/pel Will get bladder scan due to h/o prostate enlargement ( addendum: bladder scan with minimal urine of 53cc) Cr improving, repeat lab in am, renal dosing  meds  Thrombocytopenia Acute , likely due to sepsis Monitor, expect improvement  Normocytic anemia, likely anemia of chronic disease hgb 11-12 at baseline   Chronic afib with h/o TIA presents with hypotension, has intermittent bradycardia on tele Home meds toprolxl 12.5mg  daily held Coumadin continued Continue on tele for another 24hrs, consider discontinue tele if clinically continue to improve  Chronic combined CHF, last ef 25% Avoid further hydration, change IV meds to oral, continue hold home med Lasix and metolazone Close monitor volume status, likely able to restart diuretics in 24 to 48 hours   CAD h/o TAVR On Statin  Betablocker held due to hypotension No chest pain  H/o HIV per chart review, not on meds, no lymphopenia   RA on methotrexate qweek/immunosuppressed status  Malignant melanoma of skin of back -apparently would require anesthesia to remove and patient not a good candidate for anesthesia (see pal care consult note from Jan).  FTT Presents with weakness At baseline "lives along with good family support,  use of rollator walker within home; WC outside. "  PT recommended SNF placement  DVT Prophylaxis: coumadin   Code Status: full  Family Communication: patient , daughter [name] over the phone with permission ( daughter [name] would like be updated daily)   Disposition Plan: SNF in 24-48hrs, family agrees to snf placement Patient is previously independent, lives alone, He is followed by [facility] at home , last visit on 1/28 per chart review   Consultants:  none  Procedures:  none  Antibiotics:  Oral vanc and flagyl   Objective: BP (!) 110/59 (  BP Location: Left Arm)   Pulse 75   Temp 97.9 F (36.6 C) (Oral)   Resp 16   Ht 6' (1.829 m)   Wt 82.1 kg   SpO2 91%   BMI 24.55 kg/m   Intake/Output Summary (Last 24 hours) at 01/01/2020 0744 Last data filed at 01/01/2020 [id number] Gross per 24 hour  Intake 300 ml  Output 102 ml  Net  198 ml   Filed Weights   12/31/19 1940  Weight: 82.1 kg    Exam: Patient is examined daily including today on 01/01/2020, exams remain the same as of yesterday except that has changed    General:  NAD  Cardiovascular: IRRR  Respiratory: diminished at basis, no wheezing, no rales  Abdomen: Soft/ND/NT, positive BS  Musculoskeletal: Chronic venous stasis skin changes lateral lower extremity,  no significant Edema but difficult to assess due to chronic skin changes   Neuro: alert, oriented   Data Reviewed: Basic Metabolic Panel: Recent Labs  Lab 12/28/19 1612 12/30/19 2002 12/31/19 0415  NA 139 137 137  K 3.2* 3.3* 3.2*  CL 103 103 101  CO2 31 27 27   GLUCOSE 66* 122* 109*  BUN 16 17 18   CREATININE 1.42 1.60* 1.58*  CALCIUM 8.5 8.1* 7.7*   Liver Function Tests: Recent Labs  Lab 12/30/19 2002 12/31/19 0415  AST 23 21  ALT 10 12  ALKPHOS 48 39  BILITOT 1.4* 0.7  PROT 5.3* 4.5*  ALBUMIN 2.5* 2.1*   No results for input(s): LIPASE, AMYLASE in the last 168 hours. No results for input(s): AMMONIA in the last 168 hours. CBC: Recent Labs  Lab 12/28/19 1612 12/30/19 2002 12/31/19 0415  WBC 9.6 18.7* 13.3*  NEUTROABS 6.4  --   --   HGB 12.5* 12.1* 11.0*  HCT 37.3* 36.8* 33.2*  MCV 94.4 96.1 96.2  PLT 176.0 119* 94*   Cardiac Enzymes:   No results for input(s): CKTOTAL, CKMB, CKMBINDEX, TROPONINI in the last 168 hours. BNP (last 3 results) Recent Labs    12/07/19 1757  BNP 244.9*    ProBNP (last 3 results) Recent Labs    02/07/19 1625  PROBNP 2,119*    CBG: No results for input(s): GLUCAP in the last 168 hours.  Recent Results (from the past 240 hour(s))  Culture, blood (routine x 2)     Status: None (Preliminary result)   Collection Time: 12/30/19 10:45 PM   Specimen: BLOOD  Result Value Ref Range Status   Specimen Description BLOOD LEFT ANTECUBITAL  Final   Special Requests   Final    BOTTLES DRAWN AEROBIC AND ANAEROBIC Blood Culture  adequate volume   Culture   Final    NO GROWTH 2 DAYS Performed at [facility], [address], [place], [place] [address]    Report Status PENDING  Incomplete  Culture, blood (routine x 2)     Status: None (Preliminary result)   Collection Time: 12/30/19 10:45 PM   Specimen: BLOOD  Result Value Ref Range Status   Specimen Description BLOOD RIGHT ANTECUBITAL  Final   Special Requests   Final    BOTTLES DRAWN AEROBIC AND ANAEROBIC Blood Culture adequate volume   Culture   Final    NO GROWTH 2 DAYS Performed at [facility], [place] [address] [place], [place] [address]    Report Status PENDING  Incomplete  Respiratory Panel by RT PCR (Flu A&B, Covid) - Nasopharyngeal Swab     Status: None  Collection Time: 12/30/19 11:22 PM   Specimen: Nasopharyngeal Swab  Result Value Ref Range Status   SARS Coronavirus 2 by RT PCR NEGATIVE NEGATIVE Final    Comment: (NOTE) SARS-CoV-2 target nucleic acids are NOT DETECTED. The SARS-CoV-2 RNA is generally detectable in upper respiratoy specimens during the acute phase of infection. The lowest concentration of SARS-CoV-2 viral copies this assay can detect is 131 copies/mL. A negative result does not preclude SARS-Cov-2 infection and should not be used as the sole basis for treatment or other patient management decisions. A negative result may occur with  improper specimen collection/handling, submission of specimen other than nasopharyngeal swab, presence of viral mutation(s) within the areas targeted by this assay, and inadequate number of viral copies (<131 copies/mL). A negative result must be combined with clinical observations, patient history, and epidemiological information. The expected result is Negative. Fact Sheet for Patients:  PinkCheek.be Fact Sheet for Healthcare Providers:  GravelBags.it This test is not yet ap proved or cleared by the [place] FDA and   has been authorized for detection and/or diagnosis of SARS-CoV-2 by FDA under an Emergency Use Authorization (EUA). This EUA will remain  in effect (meaning this test can be used) for the duration of the COVID-19 declaration under Section 564(b)(1) of the Act, 21 U.S.C. section 360bbb-3(b)(1), unless the authorization is terminated or revoked sooner.    Influenza A by PCR NEGATIVE NEGATIVE Final   Influenza B by PCR NEGATIVE NEGATIVE Final    Comment: (NOTE) The Xpert Xpress SARS-CoV-2/FLU/RSV assay is intended as an aid in  the diagnosis of influenza from Nasopharyngeal swab specimens and  should not be used as a sole basis for treatment. Nasal washings and  aspirates are unacceptable for Xpert Xpress SARS-CoV-2/FLU/RSV  testing. Fact Sheet for Patients: PinkCheek.be Fact Sheet for Healthcare Providers: GravelBags.it This test is not yet approved or cleared by the [place] FDA and  has been authorized for detection and/or diagnosis of SARS-CoV-2 by  FDA under an Emergency Use Authorization (EUA). This EUA will remain  in effect (meaning this test can be used) for the duration of the  Covid-19 declaration under Section 564(b)(1) of the Act, 21  U.S.C. section 360bbb-3(b)(1), unless the authorization is  terminated or revoked. Performed at [facility], [place] [address] [place], [place] [address]   C Difficile Quick Screen w PCR reflex     Status: Abnormal   Collection Time: 12/31/19 10:21 AM   Specimen: STOOL  Result Value Ref Range Status   C Diff antigen NEGATIVE NEGATIVE Final   C Diff toxin POSITIVE (A) NEGATIVE Final   C Diff interpretation Results are indeterminate. See PCR results.  Final    Comment: Performed at [facility], [place] [address], [place], [place] [address]  C. Diff by PCR, Reflexed     Status: Abnormal   Collection Time: 12/31/19 10:21 AM  Result Value Ref Range Status   Toxigenic C.  Difficile by PCR POSITIVE (A) NEGATIVE Final    Comment: Positive for toxigenic C. difficile with little to no toxin production. Only treat if clinical presentation suggests symptomatic illness. Performed at [facility], [place] [address] [place], [place] [address]      Studies: No results found.  Scheduled Meds: . atorvastatin  10 mg Oral QHS  . folic acid  1 mg Oral Daily  . hydrocortisone sod succinate (SOLU-CORTEF) inj  50 mg Intravenous Q8H  . potassium chloride  40 mEq Oral Once  .  saccharomyces boulardii  250 mg Oral BID  . tamsulosin  0.4 mg Oral QPM  . vancomycin  125 mg Oral QID  . Warfarin - Pharmacist Dosing Inpatient   Does not apply q1800    Continuous Infusions: . metronidazole 500 mg (01/01/20 0519)     Time spent: 32mins I have personally reviewed and interpreted on  01/01/2020 daily labs, tele strips, imagings as discussed above under date review session and assessment and plans.  I reviewed all nursing notes, pharmacy notes, vitals, pertinent old records  I have discussed plan of care as described above with RN , patient and family on 01/01/2020   Florencia Reasons MD, PhD, FACP  [facility]  Available via Epic secure chat 7am-7pm for nonurgent issues Please page for urgent issues, pager number available through [email or web address] .   01/01/2020, 7:44 AM  LOS: 1 day

## 2020-01-01 NOTE — Progress Notes (Signed)
ANTICOAGULATION CONSULT NOTE - Initial Consult  Pharmacy Consult for Warfarin Indication: atrial fibrillation  Allergies  Allergen Reactions  . Levofloxacin Rash    "thought they were sticking swords thru me"  . Klor-Con [Potassium Chloride Er] Diarrhea    Patient Measurements: Height: 6' (182.9 cm) Weight: 181 lb (82.1 kg) IBW/kg (Calculated) : 77.6  Vital Signs: Temp: 97.9 F (36.6 C) (02/07 0717) Temp Source: Oral (02/07 0717) BP: 110/59 (02/07 0717) Pulse Rate: 75 (02/07 0717)  Labs: Recent Labs    12/30/19 2002 12/31/19 0124 12/31/19 0415 01/01/20 0310  HGB 12.1*  --  11.0*  --   HCT 36.8*  --  33.2*  --   PLT 119*  --  94*  --   LABPROT  --  34.9*  --  34.6*  INR  --  3.5*  --  3.4*  CREATININE 1.60*  --  1.58*  --     Estimated Creatinine Clearance: 32.7 mL/min (A) (by C-G formula based on SCr of 1.58 mg/dL (H)).   Medical History: Past Medical History:  Diagnosis Date  . Atrial fibrillation, chronic (HCC)    a. on coumadin   . Carotid artery occlusion    right total internal artery occlusion  . Chronic diastolic CHF (congestive heart failure) ([place])   . Coronary artery disease    s/p cardiac cath in 1990s, and cardiolite study  in 2005 showing EF 54%  . Degenerative disc disease    cervical spine  . GERD (gastroesophageal reflux disease)   . HIV infection ([place])   . Hyperlipidemia   . Hypertension   . Hypothyroidism   . Rheumatoid arthritis ([place])   . S/P TAVR (transcatheter aortic valve replacement)    Edwards Sapien 3 THV (size 29 mm) via the R TF approach   . Skin cancer    forehead  . Stroke [facility])    TIA   . TIA (transient ischemic attack)   . Urgency incontinence     Medications:  Awaiting electronic med rec  Assessment: 86 y.o. presents with fever/diarrhea - concern for intra-abd sepsis. Pt on warfarin PTA for afib. Home dose: 2.5mg  Mon and Fri and 5mg  all other days. INR today is 3.4 (supratherapeutic). Plt down to 94. No  signs/symptoms of bleeding  Goal of Therapy:  INR 2-3 Monitor platelets by anticoagulation protocol: Yes   Plan:  Daily INR No coumadin tonight   Sherren Kerns, PharmD PGY1 [place] Resident Please see amion for complete clinical pharmacist phone list 01/01/2020,7:40 AM

## 2020-01-01 NOTE — Evaluation (Signed)
Physical Therapy Evaluation Patient Details Name: [name] [name] MRN: [id number] DOB: August 09, 1925 Today's Date: 01/01/2020   History of Present Illness  86 yo male comes from home lives alone admitted with hypotension, diarrhea, and low-grade temp. Pt with history of chronic atrial fibrillation, rheumatoid arthritis on methotrexate, systolic heart failure with EF 20 to 25%, HTN, CVA, toe amputation, and DDD.    Clinical Impression  Pt admitted with above diagnosis. PTA pt lived at home alone, mod I with rollator. Daughters and neighbors provided consistent daily assist with meals, cleaning and meds. Pt reports multiple falls. On eval, he required mod assist bed mobility, mod assist sit to stand and min assist ambulation 7' with RW. Pt currently with functional limitations due to the deficits listed below (see PT Problem List). Pt will benefit from skilled PT to increase their independence and safety with mobility to allow discharge to the venue listed below.       Follow Up Recommendations SNF;Supervision/Assistance - 24 hour    Equipment Recommendations  Other (comment)(defer to next venue)    Recommendations for Other Services       Precautions / Restrictions Precautions Precautions: Fall      Mobility  Bed Mobility Overal bed mobility: Needs Assistance Bed Mobility: Supine to Sit;Sit to Supine     Supine to sit: Mod assist;HOB elevated Sit to supine: Mod assist;HOB elevated   General bed mobility comments: +rail, increased time, cues for sequencing. Assist with BLE and to elevate trunk.  Transfers Overall transfer level: Needs assistance Equipment used: Rolling walker (2 wheeled) Transfers: Sit to/from Stand Sit to Stand: Mod assist;From elevated surface         General transfer comment: cues for hand placement. Mod assist to power up from elevated bed.  Ambulation/Gait Ambulation/Gait assistance: Min assist Gait Distance (Feet): 7 Feet Assistive device: Rolling  walker (2 wheeled) Gait Pattern/deviations: Step-through pattern;Decreased stride length;Trunk flexed Gait velocity: decreased Gait velocity interpretation: <1.8 ft/sec, indicate of risk for recurrent falls General Gait Details: unsteady gait with RW. Assist to maintain balance. Distance limited by weakness, fatigue and pt fear of falling.  Stairs            Wheelchair Mobility    Modified Rankin (Stroke Patients Only)       Balance Overall balance assessment: Needs assistance Sitting-balance support: No upper extremity supported;Feet supported Sitting balance-Leahy Scale: Fair     Standing balance support: Bilateral upper extremity supported;During functional activity Standing balance-Leahy Scale: Poor Standing balance comment: reliant on external support                             Pertinent Vitals/Pain Pain Assessment: Faces Faces Pain Scale: Hurts little more Pain Location: sore on his bottom Pain Descriptors / Indicators: Discomfort Pain Intervention(s): Monitored during session;Repositioned    Home Living Family/patient expects to be discharged to:: Private residence Living Arrangements: Alone Available Help at Discharge: Family;Available PRN/intermittently;Neighbor Type of Home: House Home Access: [place]: One Angelica: Cecilia - 4 wheels;Walker - 2 wheels Additional Comments: Daughters live nearby    Prior Function Level of Independence: Needs assistance   Gait / Transfers Assistance Needed: mod I using rollator. Pt reports multi falls. Sleep in a lift chair.  ADL's / Homemaking Assistance Needed: Daughters and neighbor share responsibility of grocery shopping, meal prep, housekeeping and med management. Pt reports he does his dressing and bathing, but could  really use help. Those tasks are hard on his own and he is very fearful of falling. Pt reports he wears Depends for incontinence.  Comments: Pt no  longer drives.     Hand Dominance   Dominant Hand: Right    Extremity/Trunk Assessment   Upper Extremity Assessment Upper Extremity Assessment: Generalized weakness    Lower Extremity Assessment Lower Extremity Assessment: Generalized weakness;LLE deficits/detail;RLE deficits/detail RLE Deficits / Details: edema and crusty/dry skin bilat distal LEs LLE Deficits / Details: edema and crusty/dry skin bilat distal LEs    Cervical / Trunk Assessment Cervical / Trunk Assessment: Kyphotic  Communication   Communication: No difficulties  Cognition Arousal/Alertness: Awake/alert Behavior During Therapy: WFL for tasks assessed/performed Overall Cognitive Status: Within Functional Limits for tasks assessed                                        General Comments      Exercises     Assessment/Plan    PT Assessment Patient needs continued PT services  PT Problem List Decreased strength;Decreased mobility;Decreased activity tolerance;Decreased balance       PT Treatment Interventions DME instruction;Therapeutic activities;Gait training;Therapeutic exercise;Patient/family education;Balance training;Functional mobility training    PT Goals (Current goals can be found in the Care Plan section)  Acute Rehab PT Goals Patient Stated Goal: get stronger PT Goal Formulation: With patient Time For Goal Achievement: 01/15/20 Potential to Achieve Goals: Good    Frequency Min 2X/week   Barriers to discharge        Co-evaluation               AM-PAC PT "6 Clicks" Mobility  Outcome Measure Help needed turning from your back to your side while in a flat bed without using bedrails?: A Little Help needed moving from lying on your back to sitting on the side of a flat bed without using bedrails?: A Lot Help needed moving to and from a bed to a chair (including a wheelchair)?: A Lot Help needed standing up from a chair using your arms (e.g., wheelchair or bedside  chair)?: A Little Help needed to walk in hospital room?: A Lot Help needed climbing 3-5 steps with a railing? : Total 6 Click Score: 13    End of Session Equipment Utilized During Treatment: Gait belt Activity Tolerance: Patient tolerated treatment well Patient left: in bed;with call bell/phone within reach Nurse Communication: Mobility status PT Visit Diagnosis: Unsteadiness on feet (R26.81);Difficulty in walking, not elsewhere classified (R26.2);Muscle weakness (generalized) (M62.81)    Time: [id number] PT Time Calculation (min) (ACUTE ONLY): 30 min   Charges:   PT Evaluation $PT Eval Moderate Complexity: 1 Mod PT Treatments $Gait Training: 8-22 mins        Lorrin Goodell, PT  Office # [phone] Pager [phone]   Lorriane Shire 01/01/2020, 12:03 PM

## 2020-01-02 ENCOUNTER — Telehealth: Payer: Self-pay | Admitting: Adult Health

## 2020-01-02 ENCOUNTER — Other Ambulatory Visit: Payer: Self-pay | Admitting: *Deleted

## 2020-01-02 LAB — CBC WITH DIFFERENTIAL/PLATELET
Abs Immature Granulocytes: 0.15 10*3/uL — ABNORMAL HIGH (ref 0.00–0.07)
Basophils Absolute: 0 10*3/uL (ref 0.0–0.1)
Basophils Relative: 0 %
Eosinophils Absolute: 0 10*3/uL (ref 0.0–0.5)
Eosinophils Relative: 0 %
HCT: 33.8 % — ABNORMAL LOW (ref 39.0–52.0)
Hemoglobin: 11.2 g/dL — ABNORMAL LOW (ref 13.0–17.0)
Immature Granulocytes: 1 %
Lymphocytes Relative: 4 %
Lymphs Abs: 0.6 10*3/uL — ABNORMAL LOW (ref 0.7–4.0)
MCH: 31.5 pg (ref 26.0–34.0)
MCHC: 33.1 g/dL (ref 30.0–36.0)
MCV: 94.9 fL (ref 80.0–100.0)
Monocytes Absolute: 0.3 10*3/uL (ref 0.1–1.0)
Monocytes Relative: 2 %
Neutro Abs: 14.9 10*3/uL — ABNORMAL HIGH (ref 1.7–7.7)
Neutrophils Relative %: 93 %
Platelets: 126 10*3/uL — ABNORMAL LOW (ref 150–400)
RBC: 3.56 MIL/uL — ABNORMAL LOW (ref 4.22–5.81)
RDW: 15.2 % (ref 11.5–15.5)
WBC: 16 10*3/uL — ABNORMAL HIGH (ref 4.0–10.5)
nRBC: 0 % (ref 0.0–0.2)

## 2020-01-02 LAB — BASIC METABOLIC PANEL
Anion gap: 13 (ref 5–15)
BUN: 17 mg/dL (ref 8–23)
CO2: 24 mmol/L (ref 22–32)
Calcium: 8.2 mg/dL — ABNORMAL LOW (ref 8.9–10.3)
Chloride: 102 mmol/L (ref 98–111)
Creatinine, Ser: 1.22 mg/dL (ref 0.61–1.24)
GFR calc Af Amer: 59 mL/min — ABNORMAL LOW (ref >60)
GFR calc non Af Amer: 51 mL/min — ABNORMAL LOW (ref >60)
Glucose, Bld: 110 mg/dL — ABNORMAL HIGH (ref 70–99)
Potassium: 4 mmol/L (ref 3.5–5.1)
Sodium: 139 mmol/L (ref 135–145)

## 2020-01-02 LAB — MAGNESIUM: Magnesium: 1.9 mg/dL (ref 1.7–2.4)

## 2020-01-02 LAB — URINE CULTURE: Culture: NO GROWTH

## 2020-01-02 LAB — PROTIME-INR
INR: 1.9 — ABNORMAL HIGH (ref 0.8–1.2)
Prothrombin Time: 21.8 seconds — ABNORMAL HIGH (ref 11.4–15.2)

## 2020-01-02 MED ORDER — WARFARIN SODIUM 2.5 MG PO TABS
2.5000 mg | ORAL_TABLET | Freq: Once | ORAL | Status: AC
  Administered 2020-01-02: 2.5 mg via ORAL
  Filled 2020-01-02: qty 1

## 2020-01-02 MED ORDER — PREDNISONE 20 MG PO TABS
40.0000 mg | ORAL_TABLET | Freq: Every day | ORAL | Status: DC
  Administered 2020-01-03: 40 mg via ORAL
  Filled 2020-01-02: qty 2

## 2020-01-02 NOTE — Patient Outreach (Signed)
[facility]) Care Management  01/02/2020  MACADE ROBARTS 1925/06/22 [id number]   RN Health Coach Monthly Outreach  Referral Date: 01/17/2019 Referral Source: Transfer from [place] Reason for Referral: Continued Disease Management Education Insurance:[employer] Medicare   Outreach Attempt:  Received notification patient admitted to [facility] on 12/30/2019 for sepsis.  RN Health Coach sent notification to [facility] Liaison of patients admission.  Plan:   RN Health Coach will await [facility] Liaisons recommendation for discharge follow up.  [place] [phone] [email or web address]

## 2020-01-02 NOTE — Consult Note (Signed)
   [facility] Inpatient Consult   01/02/2020  [name] [name] [birth year] [id number]   Patient is currently active with [facility] for chronic disease management services.  Patient has been engaged by a [facility].  Our community based plan of care has focused on disease management and community resource support.     Plan: Currently, patient is recommended for a skilled nursing facility stay.  Will alert Inpatient Transition Of Care [TOC] team member to make aware that [facility] following.   Of note, [facility] Care Management services does not replace or interfere with any services that are needed or arranged by inpatient [facility] care management team.  For additional questions or referrals please contact:  Natividad Brood, RN BSN [facility] Liaison  [phone] business mobile phone Toll free office [phone]  Fax number: [phone] [email or web address]

## 2020-01-02 NOTE — Progress Notes (Addendum)
[facility] for Warfarin Indication: atrial fibrillation  Allergies  Allergen Reactions  . Levofloxacin Rash    "thought they were sticking swords thru me"  . Klor-Con [Potassium Chloride Er] Diarrhea    Patient Measurements: Height: 6' (182.9 cm) Weight: 181 lb (82.1 kg) IBW/kg (Calculated) : 77.6  Vital Signs: Temp: 97.8 F (36.6 C) (02/08 0821) Temp Source: Oral (02/08 0821) BP: 130/65 (02/08 0821) Pulse Rate: 50 (02/08 0821)  Labs: Recent Labs    12/30/19 2002 12/30/19 2002 12/31/19 0124 12/31/19 0415 01/01/20 0310 01/01/20 0803 01/02/20 0345  HGB 12.1*   < >  --  11.0*  --   --  11.2*  HCT 36.8*  --   --  33.2*  --   --  33.8*  PLT 119*  --   --  94*  --   --  126*  LABPROT  --   --  34.9*  --  34.6*  --  21.8*  INR  --   --  3.5*  --  3.4*  --  1.9*  CREATININE 1.60*   < >  --  1.58*  --  1.22 1.22   < > = values in this interval not displayed.    Estimated Creatinine Clearance: 42.4 mL/min (by C-G formula based on SCr of 1.22 mg/dL).   Medical History: Past Medical History:  Diagnosis Date  . Atrial fibrillation, chronic (HCC)    a. on coumadin   . Carotid artery occlusion    right total internal artery occlusion  . Chronic diastolic CHF (congestive heart failure) ([place])   . Coronary artery disease    s/p cardiac cath in 1990s, and cardiolite study  in 2005 showing EF 54%  . Degenerative disc disease    cervical spine  . GERD (gastroesophageal reflux disease)   . HIV infection ([place])   . Hyperlipidemia   . Hypertension   . Hypothyroidism   . Rheumatoid arthritis ([place])   . S/P TAVR (transcatheter aortic valve replacement)    Edwards Sapien 3 THV (size 29 mm) via the R TF approach   . Skin cancer    forehead  . Stroke [facility])    TIA   . TIA (transient ischemic attack)   . Urgency incontinence       Assessment: 86 y.o. presents with fever/diarrhea - concern for intra-abd sepsis. Pt on warfarin PTA for  afib. Home dose: 2.5mg  Mon and Fri and 5mg  all other days.  -INR= 1.9  Goal of Therapy:  INR 2-3 Monitor platelets by anticoagulation protocol: Yes   Plan:  -Coumadin 2.5mg  today -Daily PT/INR  Hildred Laser, PharmD Clinical Pharmacist **Pharmacist phone directory can now be found on [email or web address] ([email or web address]).  Listed under [place].

## 2020-01-02 NOTE — Telephone Encounter (Signed)
Pt's daughter([name] Owens Shark) is requesting to speak to [facility] and give him an update on pt. Daughter([name]) refused to give me any information and asked for a message be sent. Thanks   [name] Brown's # [phone]

## 2020-01-02 NOTE — Progress Notes (Addendum)
Pt refusing his antiobiotics this am, states his BM frequency has increased since starting the oral ones (he had x6 incontinent loose BMs yesterday, 3 of them on my shift) however I explained even though antibiotics can cause GI symptoms, he also has an active cdiff infection. He requests to speak w/ MD in AM before taking anymore oral abx, will report off to day shift.

## 2020-01-02 NOTE — NC FL2 (Signed)
[place] LEVEL OF CARE SCREENING TOOL     IDENTIFICATION  Patient Name: [name] [name] Birthdate: 1925/10/26 Sex: male Admission Date (Current Location): 12/30/2019  [facility] and [place] Number:  Herbalist and Address:  [facility]. [facility], [place] [address], [place], [place] [address]      Provider Number: [id number]  Attending Physician Name and Address:  Hoyt Koch, MD  Relative Name and Phone Number:  Jettie Booze [phone]    Current Level of Care: Hospital Recommended Level of Care: [place] Prior Approval Number:    Date Approved/Denied:   PASRR Number: [id number]  Discharge Plan: SNF    Current Diagnoses: Patient Active Problem List   Diagnosis Date Noted  . Chronic systolic CHF (congestive heart failure) ([place]) 12/31/2019  . Malignant melanoma of skin of back ([place]) 12/31/2019  . Rheumatoid arthritis ([place]) 12/31/2019  . AKI (acute kidney injury) ([place]) 12/31/2019  . Swelling 07/29/2019  . S/P TAVR (transcatheter aortic valve replacement)   . Urinary frequency 06/10/2018  . Lower respiratory infection 12/18/2017  . Iron deficiency anemia 12/16/2016  . Severe aortic stenosis 04/10/2015  . Chronic anticoagulation 02/17/2015  . Bilateral carotid artery disease ([place]) 09/12/2014  . Acute on chronic diastolic heart failure ([place]) 07/10/2014  . Hypothyroidism 07/04/2013  . Occlusion and stenosis of carotid artery without mention of cerebral infarction 05/18/2012  . Diarrhea 02/07/2012  . COPD with asthma ([place]) 01/26/2012  . Sepsis associated hypotension ([place]) 11/27/2011  . Long term (current) use of anticoagulants 02/24/2011  . Atrial fibrillation ([place]) 01/12/2009  . HLD (hyperlipidemia) 06/15/2007  . Benign essential HTN 06/15/2007    Orientation RESPIRATION BLADDER Height & Weight     Self, Time, Situation, Place  Normal Continent Weight: 181 lb (82.1 kg) Height:  6' (182.9 cm)  BEHAVIORAL  SYMPTOMS/MOOD NEUROLOGICAL BOWEL NUTRITION STATUS      Incontinent Diet(See discharge summary)  AMBULATORY STATUS COMMUNICATION OF NEEDS Skin   Limited Assist Verbally Other (Comment)(Pressure injury sacrum mid-stage 2; partial thickness loss of dermis presenting as a shallow open injury w/a red, pink wound without slough.)                       Personal Care Assistance Level of Assistance  Feeding, Bathing, Dressing Bathing Assistance: Limited assistance Feeding assistance: Independent Dressing Assistance: Limited assistance     Functional Limitations Info  Sight, Hearing, Speech Sight Info: Adequate Hearing Info: Adequate Speech Info: Adequate    SPECIAL CARE FACTORS FREQUENCY  PT (By licensed PT)     PT Frequency: 2x weekly              Contractures Contractures Info: Not present    Additional Factors Info  Code Status, Allergies, Isolation Precautions Code Status Info: Full Allergies Info: Levofloxacin, Klor-con (potassium chloride)     Isolation Precautions Info: Enteric pre     Current Medications (01/02/2020):  This is the current hospital active medication list Current Facility-Administered Medications  Medication Dose Route Frequency Provider Last Rate Last Admin  . acetaminophen (TYLENOL) tablet 650 mg  650 mg Oral Q6H PRN Hoyt Koch, MD       Or  . acetaminophen (TYLENOL) suppository 650 mg  650 mg Rectal Q6H PRN Hoyt Koch, MD      . atorvastatin (LIPITOR) tablet 10 mg  10 mg Oral QHS Hoyt Koch, MD   10 mg at 01/01/20 2041  . folic acid (  FOLVITE) tablet 1 mg  1 mg Oral Daily Hoyt Koch, MD   1 mg at 01/02/20 0947  . ondansetron (ZOFRAN) tablet 4 mg  4 mg Oral Q6H PRN Hoyt Koch, MD       Or  . ondansetron [facility]) injection 4 mg  4 mg Intravenous Q6H PRN Hoyt Koch, MD      . Derrill Memo ON 01/03/2020] predniSONE (DELTASONE) tablet 40 mg  40 mg Oral Q breakfast Hoyt Koch, MD       . saccharomyces boulardii (FLORASTOR) capsule 250 mg  250 mg Oral BID Hoyt Koch, MD   250 mg at 01/02/20 0947  . tamsulosin (FLOMAX) capsule 0.4 mg  0.4 mg Oral QPM Hoyt Koch, MD   0.4 mg at 01/01/20 1700  . vancomycin (VANCOCIN) 50 mg/mL oral solution 125 mg  125 mg Oral QID Hoyt Koch, MD   125 mg at 01/02/20 0947  . warfarin (COUMADIN) tablet 2.5 mg  2.5 mg Oral ONCE-1800 Kris Mouton, [facility]      . Warfarin - Pharmacist Dosing Inpatient   Does not apply q1800 Hoyt Koch, MD   Stopped at 12/31/19 1800     Discharge Medications: Please see discharge summary for a list of discharge medications.  Relevant Imaging Results:  Relevant Lab Results:   Additional Information SS# [id number]  Carolin Sicks, [place]

## 2020-01-02 NOTE — TOC Initial Note (Signed)
Transition of Care [facility]) - Initial/Assessment Note    Patient Details  Name: [name] [name] MRN: [id number] Date of Birth: 07/20/1925  Transition [facility]) CM/SW Contact:    Gabrielle Dare Phone Number: 01/02/2020, 12:37 PM  Clinical Narrative:                 CSW met with pt at bedside, introduce self and role.  CSW discussed with pt concerning PT's recommendation for SNF. Pt lives alone. He has had several falls at home. Pt is agreeable for rehab at SNF.  CSW shared that pt's daughter [name] was also contacted to give SNF choices.  Pt agrees with daughter's choices for SNF.  Pt would like to go to [facility] where his wife is.  Pt has not seen her since February 2020 due to covid.  Pt's daughter shared that she [name] ) shares POA/HCOPA with her sister Jerrye Beavers. TOC Team will continue to follow for disposition planning.    Expected Discharge Plan: Skilled Nursing Facility Barriers to Discharge: Continued Medical Work up, SNF Pending bed offer, Insurance Authorization   Patient Goals and CMS Choice Patient states their goals for this hospitalization and ongoing recovery are:: Pt's Daughter stated " to be able to walk". CMS Medicare.gov Compare Post Acute Care list provided to:: Patient Choice offered to / list presented to : Adult Children(Gave daughter web address to review for SNF facility.)  Expected Discharge Plan and Services Expected Discharge Plan: [place] arrangements for the past 2 months: Single Family Home                                      Prior Living Arrangements/Services Living arrangements for the past 2 months: Single Family Home Lives with:: Self Patient language and need for interpreter reviewed:: No Do you feel safe going back to the place where you live?: Yes      Need for Family Participation in Patient Care: Yes (Comment) Care giver support system in place?: Yes (comment)   Criminal Activity/Legal Involvement  Pertinent to Current Situation/Hospitalization: No - Comment as needed  Activities of Daily Living      Permission Sought/Granted Permission sought to share information with : Case Manager, Customer service manager, Family Supports Permission granted to share information with : Yes, Verbal Permission Granted  Share Information with NAME: Jettie Booze  Permission granted to share info w AGENCY: Yes  Permission granted to share info w Relationship: Daughters  Permission granted to share info w Contact Information: Yes  Emotional Assessment Appearance:: Appears stated age Attitude/Demeanor/Rapport: Engaged, Gracious Affect (typically observed): Appropriate, Accepting, Calm Orientation: : Oriented to Self, Oriented to Place, Oriented to  Time, Oriented to Situation Alcohol / Substance Use: Not Applicable Psych Involvement: No (comment)  Admission diagnosis:  Diarrhea [R19.7] Sepsis associated hypotension ([place]) [A41.9, I95.9] Hypotension, unspecified hypotension type [I95.9] Sepsis, due to unspecified organism, unspecified whether acute organ dysfunction present [facility]) [A41.9] Patient Active Problem List   Diagnosis Date Noted  . Chronic systolic CHF (congestive heart failure) ([place]) 12/31/2019  . Malignant melanoma of skin of back ([place]) 12/31/2019  . Rheumatoid arthritis ([place]) 12/31/2019  . AKI (acute kidney injury) ([place]) 12/31/2019  . Swelling 07/29/2019  . S/P TAVR (transcatheter aortic valve replacement)   . Urinary frequency 06/10/2018  . Lower respiratory infection 12/18/2017  . Iron deficiency anemia 12/16/2016  .  Severe aortic stenosis 04/10/2015  . Chronic anticoagulation 02/17/2015  . Bilateral carotid artery disease ([place]) 09/12/2014  . Acute on chronic diastolic heart failure ([place]) 07/10/2014  . Hypothyroidism 07/04/2013  . Occlusion and stenosis of carotid artery without mention of cerebral infarction 05/18/2012  . Diarrhea 02/07/2012  . COPD with asthma ([place])  01/26/2012  . Sepsis associated hypotension ([place]) 11/27/2011  . Long term (current) use of anticoagulants 02/24/2011  . Atrial fibrillation ([place]) 01/12/2009  . HLD (hyperlipidemia) 06/15/2007  . Benign essential HTN 06/15/2007   PCP:  [name], NP Pharmacy:   [place], [place] [address] Phone: [phone] Fax: [phone]  [facility] [place], [place] [address] Phone: [phone] Fax: [phone]     Social Determinants of Health (SDOH) Interventions    Readmission Risk Interventions No flowsheet data found.

## 2020-01-02 NOTE — Progress Notes (Addendum)
PROGRESS NOTE    [name] GIOVANELLI  ZDG:[id number] DOB: 09/02/1925 DOA: 12/30/2019 PCP: [name], NP   Brief Narrative:  Pt developed diarrhea back in Jan, got better with immodium for a while until he was told to stop this med.  Saw PCP. Didn't think it was infectious as of last PCP visit.  presents to ED with generalized weakness, low BP, fever 100.7. Having hard time walking, dizzy and feeling like he was going to pass out.  Assessment & Plan:   Principal Problem:   Sepsis associated hypotension (HCC) Active Problems:   Benign essential HTN   Atrial fibrillation (HCC)   Diarrhea   Chronic systolic CHF (congestive heart failure) (HCC)   Malignant melanoma of skin of back (HCC)   Rheumatoid arthritis (HCC)   AKI (acute kidney injury) ([place])  cdiff colitis/sepsis -presents with fever 100.7, hypotension 77/45, tachypnea rr26, leukocytosis wbc 18.7, aki -flu and SARS-COV2 screening negative, blood culture no growth, urine culture pending cxr no acute infiltrates, Ct ab/pel "Moderate amount of gas and fecal matter in the colon, but without an obstructive pattern. No acute bowel pathology identifiable." -currently on oral vanc, have narrowed off flagyl due to reaction with coumadin and overall clinical improvement -will plan to treat for 14 days instead of 10 since stopping flagyl -counseled about the importance of taking meds until done to help stop c dif from recurring - continue hold home meds toprolXL, lasix, metolazone due to sepsis/aki. -wbc and cr improved, he is started on stress dose steroid every 8 hours on admission, clinically he is improving, taper steroid to oral tomorrow for total 5 day course  Hypokalemia: -resolved, will monitor  AKI on CKDII Cr has been elevated since having diarrhea a few weeks ago No obstructive nephropathy on ct ab/pel Cr stable  Thrombocytopenia Acute , likely due to sepsis Monitor, expect improvement  Normocytic anemia,  likely anemia of chronic disease hgb 11-12 at baseline   Chronic afib with h/o TIA presents with hypotension, has intermittent bradycardia on tele Home meds toprolxl 12.46m daily held Coumadin continued D/C tele today as no true events  Chronic combined CHF, last ef 25% continue hold home med Lasix and metolazone due to poor oral intake Close monitor volume status, likely able to restart diuretics in 24 to 48 hours   CAD h/o TAVR On Statin  Betablocker held due to hypotension, low HR  H/o HIV per chart review, not on meds, no lymphopenia   RA on methotrexate qweek/immunosuppressed status  Malignant melanoma of skin of back -apparently would require anesthesia to remove and patient not a good candidate for anesthesia (see pal care consult note from Jan).  FTT Presents with weakness At baseline "lives along with good family support,  use of rollator walker within home; WC outside. "  PT recommended SNF placement  DVT prophylaxis: coumadin Code Status: full Family Communication: spoke with daughter PJeannene Patellaand updated on situation Disposition Plan:  . Patient came from:home            . Anticipated d/c place: SNF . Barriers to d/c OR conditions which need to be met to effect a safe d/c: needs SNF for safe discharge as soon as bed available  Consultants:   none  Antimicrobials:   Vancomycin oral start date 2/6/21stop date planned 01/11/20  Flagyl oral start date 12/31/19 stop date planned 01/11/20  Subjective: Doing well overall. No diarrhea since yesterday afternoon but has perception that it is worsening. Did not take meds this  morning early. Asked him to reconsider. Explained C dif and the risks if not treated of resistance and possibly dying of this infection. Advised that this antibiotics can take some time until diarrhea is gone/better. Denies chest pains or abdominal pains. Not much appetite and trouble as his dentures do not work that well which makes eating  difficult. Update from nursing: after our conversation he did take all meds this morning.   Objective: Vitals:   01/01/20 1950 01/02/20 0000 01/02/20 0405 01/02/20 0821  BP: 111/73 124/75 115/69 130/65  Pulse: 68 63 67 (!) 50  Resp: '16 15 14 15  ' Temp: 97.8 F (36.6 C) 97.7 F (36.5 C) 97.9 F (36.6 C) 97.8 F (36.6 C)  TempSrc: Oral Oral Oral Oral  SpO2: 95% 93% 91% 94%  Weight:      Height:        Intake/Output Summary (Last 24 hours) at 01/02/2020 1032 Last data filed at 01/02/2020 1000 Gross per 24 hour  Intake 790 ml  Output 700 ml  Net 90 ml   Filed Weights   12/31/19 1940  Weight: 82.1 kg   Examination:  General exam: Appears calm and comfortable, lying in bed Respiratory system: Clear to auscultation. Respiratory effort normal. Cardiovascular system: S1 & S2 heard, RRR. No JVD, murmurs, rubs, gallops or clicks. No pedal edema. Gastrointestinal system: Abdomen is nondistended, soft and nontender. No organomegaly or masses felt. Normal bowel sounds heard. Central nervous system: Alert and oriented. No focal neurological deficits. Extremities: Symmetric 5 x 5 power. Skin: No rashes, lesions or ulcers Psychiatry: Judgement and insight appear normal. Mood & affect appropriate.   Data Reviewed: I have personally reviewed following labs and imaging studies  CBC: Recent Labs  Lab 12/28/19 1612 12/30/19 2002 12/31/19 0415 01/02/20 0345  WBC 9.6 18.7* 13.3* 16.0*  NEUTROABS 6.4  --   --  14.9*  HGB 12.5* 12.1* 11.0* 11.2*  HCT 37.3* 36.8* 33.2* 33.8*  MCV 94.4 96.1 96.2 94.9  PLT 176.0 119* 94* 223*   Basic Metabolic Panel: Recent Labs  Lab 12/28/19 1612 12/28/19 1612 12/30/19 2002 12/31/19 0415 01/01/20 0803 01/01/20 1603 01/02/20 0345  NA 139  --  137 137 136  --  139  K 3.2*   < > 3.3* 3.2* 3.1* 3.5 4.0  CL 103  --  103 101 103  --  102  CO2 31  --  '27 27 25  ' --  24  GLUCOSE 66*  --  122* 109* 142*  --  110*  BUN 16  --  '17 18 20  ' --  17   CREATININE 1.42  --  1.60* 1.58* 1.22  --  1.22  CALCIUM 8.5  --  8.1* 7.7* 7.9*  --  8.2*  MG  --   --   --   --   --   --  1.9   < > = values in this interval not displayed.   GFR: Estimated Creatinine Clearance: 42.4 mL/min (by C-G formula based on SCr of 1.22 mg/dL). Liver Function Tests: Recent Labs  Lab 12/30/19 2002 12/31/19 0415  AST 23 21  ALT 10 12  ALKPHOS 48 39  BILITOT 1.4* 0.7  PROT 5.3* 4.5*  ALBUMIN 2.5* 2.1*   No results for input(s): LIPASE, AMYLASE in the last 168 hours. No results for input(s): AMMONIA in the last 168 hours. Coagulation Profile: Recent Labs  Lab 12/28/19 0000 12/31/19 0124 01/01/20 0310 01/02/20 0345  INR 2.2 3.5* 3.4* 1.9*  Cardiac Enzymes: No results for input(s): CKTOTAL, CKMB, CKMBINDEX, TROPONINI in the last 168 hours. BNP (last 3 results) Recent Labs    02/07/19 1625  PROBNP 2,119*   HbA1C: No results for input(s): HGBA1C in the last 72 hours. CBG: No results for input(s): GLUCAP in the last 168 hours. Lipid Profile: No results for input(s): CHOL, HDL, LDLCALC, TRIG, CHOLHDL, LDLDIRECT in the last 72 hours. Thyroid Function Tests: No results for input(s): TSH, T4TOTAL, FREET4, T3FREE, THYROIDAB in the last 72 hours. Anemia Panel: No results for input(s): VITAMINB12, FOLATE, FERRITIN, TIBC, IRON, RETICCTPCT in the last 72 hours. Sepsis Labs: Recent Labs  Lab 12/30/19 1956 12/31/19 0124  LATICACIDVEN 1.6 0.9    Recent Results (from the past 240 hour(s))  Culture, blood (routine x 2)     Status: None (Preliminary result)   Collection Time: 12/30/19 10:45 PM   Specimen: BLOOD  Result Value Ref Range Status   Specimen Description BLOOD LEFT ANTECUBITAL  Final   Special Requests   Final    BOTTLES DRAWN AEROBIC AND ANAEROBIC Blood Culture adequate volume   Culture   Final    NO GROWTH 2 DAYS Performed at [facility], [place] [address], [place], [place] [address]    Report Status PENDING  Incomplete   Culture, blood (routine x 2)     Status: None (Preliminary result)   Collection Time: 12/30/19 10:45 PM   Specimen: BLOOD  Result Value Ref Range Status   Specimen Description BLOOD RIGHT ANTECUBITAL  Final   Special Requests   Final    BOTTLES DRAWN AEROBIC AND ANAEROBIC Blood Culture adequate volume   Culture   Final    NO GROWTH 2 DAYS Performed at [facility], [place] [address] [place], [place] [address]    Report Status PENDING  Incomplete  Respiratory Panel by RT PCR (Flu A&B, Covid) - Nasopharyngeal Swab     Status: None   Collection Time: 12/30/19 11:22 PM   Specimen: Nasopharyngeal Swab  Result Value Ref Range Status   SARS Coronavirus 2 by RT PCR NEGATIVE NEGATIVE Final    Comment: (NOTE) SARS-CoV-2 target nucleic acids are NOT DETECTED. The SARS-CoV-2 RNA is generally detectable in upper respiratoy specimens during the acute phase of infection. The lowest concentration of SARS-CoV-2 viral copies this assay can detect is 131 copies/mL. A negative result does not preclude SARS-Cov-2 infection and should not be used as the sole basis for treatment or other patient management decisions. A negative result may occur with  improper specimen collection/handling, submission of specimen other than nasopharyngeal swab, presence of viral mutation(s) within the areas targeted by this assay, and inadequate number of viral copies (<131 copies/mL). A negative result must be combined with clinical observations, patient history, and epidemiological information. The expected result is Negative. Fact Sheet for Patients:  PinkCheek.be Fact Sheet for Healthcare Providers:  GravelBags.it This test is not yet ap proved or cleared by the [place] FDA and  has been authorized for detection and/or diagnosis of SARS-CoV-2 by FDA under an Emergency Use Authorization (EUA). This EUA will remain  in effect (meaning this test can be  used) for the duration of the COVID-19 declaration under Section 564(b)(1) of the Act, 21 U.S.C. section 360bbb-3(b)(1), unless the authorization is terminated or revoked sooner.    Influenza A by PCR NEGATIVE NEGATIVE Final   Influenza B by PCR NEGATIVE NEGATIVE Final    Comment: (NOTE) The Xpert Xpress SARS-CoV-2/FLU/RSV assay is intended as  an aid in  the diagnosis of influenza from Nasopharyngeal swab specimens and  should not be used as a sole basis for treatment. Nasal washings and  aspirates are unacceptable for Xpert Xpress SARS-CoV-2/FLU/RSV  testing. Fact Sheet for Patients: PinkCheek.be Fact Sheet for Healthcare Providers: GravelBags.it This test is not yet approved or cleared by the [place] FDA and  has been authorized for detection and/or diagnosis of SARS-CoV-2 by  FDA under an Emergency Use Authorization (EUA). This EUA will remain  in effect (meaning this test can be used) for the duration of the  Covid-19 declaration under Section 564(b)(1) of the Act, 21  U.S.C. section 360bbb-3(b)(1), unless the authorization is  terminated or revoked. Performed at [facility], [place] [address] [place], [place] [address]   C Difficile Quick Screen w PCR reflex     Status: Abnormal   Collection Time: 12/31/19 10:21 AM   Specimen: STOOL  Result Value Ref Range Status   C Diff antigen NEGATIVE NEGATIVE Final   C Diff toxin POSITIVE (A) NEGATIVE Final   C Diff interpretation Results are indeterminate. See PCR results.  Final    Comment: Performed at [facility], [place] [address], [place], [place] [address]  C. Diff by PCR, Reflexed     Status: Abnormal   Collection Time: 12/31/19 10:21 AM  Result Value Ref Range Status   Toxigenic C. Difficile by PCR POSITIVE (A) NEGATIVE Final    Comment: Positive for toxigenic C. difficile with little to no toxin production. Only treat if clinical presentation suggests  symptomatic illness. Performed at [facility], [place] [address] [place], [place] [address]      Radiology Studies: No results found.  Scheduled Meds: . atorvastatin  10 mg Oral QHS  . folic acid  1 mg Oral Daily  . hydrocortisone sod succinate (SOLU-CORTEF) inj  50 mg Intravenous Q12H  . metroNIDAZOLE  500 mg Oral Q8H  . saccharomyces boulardii  250 mg Oral BID  . tamsulosin  0.4 mg Oral QPM  . vancomycin  125 mg Oral QID  . Warfarin - Pharmacist Dosing Inpatient   Does not apply q1800   Continuous Infusions:   LOS: 2 days   Time spent: 25  Hoyt Koch, MD [facility] To contact the attending provider between 7A-7P or the covering provider during after hours 7P-7A, please log into the web site [email or web address] and access using universal [place] password for that web site. If you do not have the password, please call the hospital operator.  01/02/2020, 10:32 AM

## 2020-01-03 ENCOUNTER — Encounter (HOSPITAL_COMMUNITY): Payer: Self-pay | Admitting: Internal Medicine

## 2020-01-03 ENCOUNTER — Other Ambulatory Visit: Payer: Self-pay

## 2020-01-03 LAB — BASIC METABOLIC PANEL
Anion gap: 8 (ref 5–15)
BUN: 18 mg/dL (ref 8–23)
CO2: 26 mmol/L (ref 22–32)
Calcium: 8.1 mg/dL — ABNORMAL LOW (ref 8.9–10.3)
Chloride: 105 mmol/L (ref 98–111)
Creatinine, Ser: 1.17 mg/dL (ref 0.61–1.24)
GFR calc Af Amer: >60 mL/min (ref >60)
GFR calc non Af Amer: 54 mL/min — ABNORMAL LOW (ref >60)
Glucose, Bld: 93 mg/dL (ref 70–99)
Potassium: 3.6 mmol/L (ref 3.5–5.1)
Sodium: 139 mmol/L (ref 135–145)

## 2020-01-03 LAB — SARS CORONAVIRUS 2 (TAT 6-24 HRS): SARS Coronavirus 2: NEGATIVE

## 2020-01-03 LAB — PROTIME-INR
INR: 1.8 — ABNORMAL HIGH (ref 0.8–1.2)
Prothrombin Time: 21.2 seconds — ABNORMAL HIGH (ref 11.4–15.2)

## 2020-01-03 MED ORDER — ACETAMINOPHEN 325 MG PO TABS: 650.0000 mg | ORAL_TABLET | Freq: Four times a day (QID) | ORAL | Status: AC | PRN

## 2020-01-03 MED ORDER — VANCOMYCIN 50 MG/ML ORAL SOLUTION: 125.0000 mg | Freq: Four times a day (QID) | ORAL | 0 refills | Status: AC

## 2020-01-03 MED ORDER — PREDNISONE 20 MG PO TABS: 40.0000 mg | ORAL_TABLET | Freq: Every day | ORAL | 0 refills | Status: AC

## 2020-01-03 MED ORDER — WARFARIN SODIUM 5 MG PO TABS
5.0000 mg | ORAL_TABLET | Freq: Once | ORAL | Status: AC
  Administered 2020-01-03: 5 mg via ORAL
  Filled 2020-01-03: qty 1

## 2020-01-03 NOTE — TOC Progression Note (Addendum)
Transition of Care [facility]) - Progression Note    Patient Details  Name: [name] [name] MRN: [id number] Date of Birth: 1925/11/02  Transition of Care [facility]) CM/SW [place], [place] Phone Number: 01/03/2020, 11:38 AM  Clinical Narrative:     Received insurance authorization # [id number]  From 01/03/20- 02/09-2020  reference # [id number]. Next review date 01/05/20.   RN updated- covid pending   Thurmond Butts, MSW, [place] Clinical Social Worker    Expected Discharge Plan: Skilled Nursing Facility Barriers to Discharge: Continued Medical Work up, SNF Pending bed offer, Ship broker  Expected Discharge Plan and Services Expected Discharge Plan: [place] arrangements for the past 2 months: Single Family Home                                       Social Determinants of Health (SDOH) Interventions    Readmission Risk Interventions No flowsheet data found.

## 2020-01-03 NOTE — Telephone Encounter (Signed)
Called [facility] no answer. Will try again later.

## 2020-01-03 NOTE — Discharge Summary (Signed)
Physician Discharge Summary  [name] [name] [id number] DOB: 08/02/1925 DOA: 12/30/2019  PCP: [name], NP  Admit date: 12/30/2019 Discharge date: 01/03/2020  Admitted From: home Disposition:  [facility]  Recommendations for Outpatient Follow-up:  1. Follow up with PCP in 1-2 weeks 2. Please obtain BMP/CBC in one week 3. Please follow up on the following pending results: none  Home Health:defer to SNF Equipment/Devices:defer to SNF  Discharge Condition:stable CODE STATUS:full Diet recommendation: Heart Healthy  Brief/Interim Summary: Pt developed diarrhea back in Jan, got better with immodium for a while until he was told to stop this med.  Saw PCP. Didn't think it was infectious as of last PCP visit.  presents to ED with generalized weakness, low BP, fever 100.7. Having hard time walking,dizzy and feeling like he was going to pass out.  Hospital course: diagnosed with C dif colitis. Initiated treatment with vancomycin oral and flagyl which was later narrowed to vancomycin oral only for total 14 days treatment. Given poor oral intake his diuretics were held and will still hold metalozone on discharge. Please restart once oral intake better depending on clinical status. Given his RA and chronic methotrexate stress dose steroids were started in hospital and discharged with another 2 days of prednisone to finish. Did have AKI initially which resolved with fluids.   Discharge Diagnoses:  Principal Problem:   Sepsis associated hypotension ([place]) Active Problems:   Benign essential HTN   Atrial fibrillation (HCC)   Diarrhea   Chronic systolic CHF (congestive heart failure) (HCC)   Malignant melanoma of skin of back (HCC)   Rheumatoid arthritis (HCC)   AKI (acute kidney injury) [facility])   Discharge Instructions  Discharge Instructions    Call MD for:  temperature >100.4   Complete by: As directed    Diet - low sodium heart healthy   Complete by: As directed     Increase activity slowly   Complete by: As directed      Allergies as of 01/03/2020      Reactions   Levofloxacin Rash   "thought they were sticking swords thru me"   Klor-con [potassium Chloride Er] Diarrhea      Medication List    STOP taking these medications   metolazone 2.5 MG tablet Commonly known as: ZAROXOLYN   Toprol XL 25 MG 24 hr tablet Generic drug: metoprolol succinate     TAKE these medications   acetaminophen 325 MG tablet Commonly known as: TYLENOL Take 2 tablets (650 mg total) by mouth every 6 (six) hours as needed for mild pain (or Fever >/= 101).   atorvastatin 10 MG tablet Commonly known as: LIPITOR Take 10 mg by mouth at bedtime.   CALTRATE 600 PLUS-VIT D PO Take 1 tablet by mouth at bedtime.   folic acid 1 MG tablet Commonly known as: FOLVITE Take 1 mg by mouth daily.   furosemide 40 MG tablet Commonly known as: LASIX Take 80 mg by mouth daily.   guaiFENesin 600 MG 12 hr tablet Commonly known as: MUCINEX Take 600-1,200 mg by mouth 2 (two) times daily as needed (cold/congestion.).   HEALTHY COLON PO Take 1 capsule by mouth daily.   methotrexate 2.5 MG tablet Commonly known as: RHEUMATREX Take 10 mg by mouth once a week. Caution:Chemotherapy. Protect from light. On Saturdays   multivitamin with minerals Tabs tablet Take 1 tablet by mouth daily.   nitroGLYCERIN 0.4 MG SL tablet Commonly known as: NITROSTAT DISSOLVE 1 TABLET UNDER THE TONGUE EVERY 5 MINUTES AS  NEEDED FOR CHEST PAIN   potassium chloride SA 20 MEQ tablet Commonly known as: KLOR-CON Take 2 tablets (40 mEq total) by mouth every morning. What changed: how much to take   predniSONE 20 MG tablet Commonly known as: DELTASONE Take 2 tablets (40 mg total) by mouth daily with breakfast for 2 days. Start taking on: January 04, 2020   tamsulosin 0.4 MG Caps capsule Commonly known as: FLOMAX Take 1 capsule (0.4 mg total) by mouth every evening.   vancomycin 50 mg/mL  oral  solution Commonly known as: VANCOCIN Take 2.5 mLs (125 mg total) by mouth 4 (four) times daily for 11 days.   warfarin 5 MG tablet Commonly known as: COUMADIN Take as directed. If you are unsure how to take this medication, talk to your nurse or doctor. Original instructions: Take 1 tablet daily except take 1 1/2 tablets Mon Wed and Fridays or Take as directed by anticoagulation clinic      Contact information for after-discharge care    Destination    [facility] .   Service: Skilled Nursing Contact information: [place] [phone]             Allergies  Allergen Reactions  . Levofloxacin Rash    "thought they were sticking swords thru me"  . Klor-Con [Potassium Chloride Er] Diarrhea    Consultations:  none   Procedures/Studies: CT ABDOMEN PELVIS WO CONTRAST  Result Date: 12/31/2019 CLINICAL DATA:  Worsening generalized weakness. Suspicion of bowel obstruction. EXAM: CT ABDOMEN AND PELVIS WITHOUT CONTRAST TECHNIQUE: Multidetector CT imaging of the abdomen and pelvis was performed following the standard protocol without IV contrast. COMPARISON:  12/07/2019 FINDINGS: Lower chest: Cardiomegaly. Previous aortic valve replacement. Lung bases are clear except for minimal dependent atelectasis on the right. Small chronic Bochdalek's hernia. Hepatobiliary: Negative. Subtle evidence of gallstones previously seen not specifically visible today. Pancreas: Normal.  Age related atrophic changes. Spleen: Normal Adrenals/Urinary Tract: No adrenal mass. Chronic renal atrophy. No sign of obstruction. No stone in the bladder. Stomach/Bowel: Moderate amount of gas and fecal matter in the colon. No sign of bowel obstruction. Vascular/Lymphatic: Aortic atherosclerosis. No aneurysm. IVC is normal. No retroperitoneal adenopathy. Reproductive: Enlarged prostate. Other: Left inguinal hernia containing only fat. Small  amount of fluid in the spermatic cord region on the left. Musculoskeletal: Chronic lower lumbar degenerative changes including degenerative anterolisthesis at L4-5 of 5 mm. IMPRESSION: No sign of bowel obstruction. Moderate amount of gas and fecal matter in the colon, but without an obstructive pattern. No acute bowel pathology identifiable. Aortic atherosclerosis. Left inguinal hernia containing only fat. Small amount of fluid in the spermatic cord region. Enlarged prostate. Renal atrophy. Electronically Signed   By: Nelson Chimes M.D.   On: 12/31/2019 01:51   DG Chest 1 View  Result Date: 12/07/2019 CLINICAL DATA:  Fall, left hip pain EXAM: CHEST  1 VIEW COMPARISON:  09/17/2018 FINDINGS: Cardiomegaly. Prior aortic valve repair. Aortic atherosclerosis. No confluent opacities, effusions or edema. No acute bony abnormality. IMPRESSION: Cardiomegaly.  No active disease. Electronically Signed   By: Rolm Baptise M.D.   On: 12/07/2019 18:28   CT Head Wo Contrast  Result Date: 12/07/2019 CLINICAL DATA:  Weakness edema EXAM: CT HEAD WITHOUT CONTRAST CT CERVICAL SPINE WITHOUT CONTRAST TECHNIQUE: Multidetector CT imaging of the head and cervical spine was performed following the standard protocol without intravenous contrast. Multiplanar CT image reconstructions of the cervical spine were also generated. COMPARISON:  PET  CT 09/05/2019, CT brain 08/28/2019 FINDINGS: CT HEAD FINDINGS Brain: No acute territorial infarction, hemorrhage or intracranial mass. Advanced atrophy. Encephalomalacia in the right frontal lobe. Moderate hypodensity in the white matter consistent with chronic small vessel ischemic change. Vascular: No hyperdense vessels.  Carotid vascular calcification Skull: Normal. Negative for fracture or focal lesion. Sinuses/Orbits: Mild mucosal thickening in the maxillary and ethmoid sinuses Other: None CT CERVICAL SPINE FINDINGS Alignment: No subluxation.  Facet alignment within normal limits. Skull base and  vertebrae: No acute fracture. No primary bone lesion or focal pathologic process. Soft tissues and spinal canal: No prevertebral fluid or swelling. No visible canal hematoma. Disc levels: Advanced degenerative change at the C1-C2 articulation. Fusion of the right facet at C3 C4 and at the left facet at C2-C3 and C3-C4. Partial fusion of the posterior vertebral bodies at C3-C4. Moderate severe degenerative change at multiple levels, most notable at C5-C6 and C6-C7. Multiple level foraminal stenosis bilaterally. Upper chest: Emphysema at the apices. Other: None IMPRESSION: 1. No CT evidence for acute intracranial abnormality. Atrophy and chronic small vessel ischemic change of the white matter. Right frontal encephalomalacia as before 2. Multiple level degenerative change of the cervical spine. No acute osseous abnormality 3. Emphysema Electronically Signed   By: Donavan Foil M.D.   On: 12/07/2019 20:42   CT Cervical Spine Wo Contrast  Result Date: 12/07/2019 CLINICAL DATA:  Weakness edema EXAM: CT HEAD WITHOUT CONTRAST CT CERVICAL SPINE WITHOUT CONTRAST TECHNIQUE: Multidetector CT imaging of the head and cervical spine was performed following the standard protocol without intravenous contrast. Multiplanar CT image reconstructions of the cervical spine were also generated. COMPARISON:  PET CT 09/05/2019, CT brain 08/28/2019 FINDINGS: CT HEAD FINDINGS Brain: No acute territorial infarction, hemorrhage or intracranial mass. Advanced atrophy. Encephalomalacia in the right frontal lobe. Moderate hypodensity in the white matter consistent with chronic small vessel ischemic change. Vascular: No hyperdense vessels.  Carotid vascular calcification Skull: Normal. Negative for fracture or focal lesion. Sinuses/Orbits: Mild mucosal thickening in the maxillary and ethmoid sinuses Other: None CT CERVICAL SPINE FINDINGS Alignment: No subluxation.  Facet alignment within normal limits. Skull base and vertebrae: No acute  fracture. No primary bone lesion or focal pathologic process. Soft tissues and spinal canal: No prevertebral fluid or swelling. No visible canal hematoma. Disc levels: Advanced degenerative change at the C1-C2 articulation. Fusion of the right facet at C3 C4 and at the left facet at C2-C3 and C3-C4. Partial fusion of the posterior vertebral bodies at C3-C4. Moderate severe degenerative change at multiple levels, most notable at C5-C6 and C6-C7. Multiple level foraminal stenosis bilaterally. Upper chest: Emphysema at the apices. Other: None IMPRESSION: 1. No CT evidence for acute intracranial abnormality. Atrophy and chronic small vessel ischemic change of the white matter. Right frontal encephalomalacia as before 2. Multiple level degenerative change of the cervical spine. No acute osseous abnormality 3. Emphysema Electronically Signed   By: Donavan Foil M.D.   On: 12/07/2019 20:42   CT ABDOMEN PELVIS W CONTRAST  Result Date: 12/07/2019 CLINICAL DATA:  86 year old male with abdominal distension and diarrhea. EXAM: CT ABDOMEN AND PELVIS WITH CONTRAST TECHNIQUE: Multidetector CT imaging of the abdomen and pelvis was performed using the standard protocol following bolus administration of intravenous contrast. CONTRAST:  63mL OMNIPAQUE IOHEXOL 300 MG/ML  SOLN COMPARISON:  CT abdomen pelvis dated 02/05/2012. FINDINGS: Lower chest: The visualized lung bases are clear. There is moderate cardiomegaly. Partially visualized coronary vascular calcification. No intra-abdominal free air.  Small free fluid  in the pelvis. Hepatobiliary: The liver is unremarkable. There is mild periportal edema. Small stones noted in the gallbladder. No pericholecystic fluid or evidence of acute cholecystitis by CT. Pancreas: No acute findings. No active inflammatory changes. No dilatation of the main pancreatic duct. Spleen: Normal in size without focal abnormality. Adrenals/Urinary Tract: Small bilateral adrenal nodules, similar to prior CT.  There is no hydronephrosis on either side. There is symmetric enhancement and excretion of contrast by both kidneys. The visualized ureters and urinary bladder appear unremarkable. Stomach/Bowel: Thickened appearance of the stomach may be related to underdistention. Gastritis is less likely but not excluded. Clinical correlation is recommended. There is redundancy of the sigmoid colon. There is no bowel obstruction. There is a 5 cm duodenal diverticulum. There is mild haziness of the mesentery in the left hemiabdomen. Clinical correlation is recommended to evaluate for enteritis. The appendix is normal. Vascular/Lymphatic: Advanced aortoiliac atherosclerotic disease. The IVC is unremarkable. No portal venous gas. There is no adenopathy. A 1.8 x 2.0 cm ovoid fluid attenuating structure to the right of the aorta (series 2, image 15) appears similar to prior CT of 2013. Reproductive: Mild enlargement of the prostate gland measuring 5.2 cm in transverse axial diameter. The seminal vesicles are symmetric. Other: Small fat containing left inguinal hernia. There is mild stranding of the herniated fat within the left inguinal canal which may be extension from the inflammatory changes of the left mesentery. A degree of strangulation or incarceration is not excluded. Clinical correlation is recommended. No fluid collection. Musculoskeletal: There is osteopenia with degenerative changes of the spine. Advanced osteoarthritic changes of the right hip. Grade 1 L4-L5 anterolisthesis. No acute osseous pathology. IMPRESSION: 1. Underdistention of the stomach versus gastritis. Clinical correlation is recommended. 2. Mild diffuse haziness of the mesentery in the left hemiabdomen. Clinical correlation is recommended to evaluate for enteritis. No bowel obstruction. Normal appendix. 3. Small fat containing left inguinal hernia. Stranding of the fat within the inguinal hernia may be extension of mesenteric stranding. Clinical correlation  is recommended to evaluate for strangulation or incarceration. No fluid collection. 4. Cholelithiasis. 5.  Aortic Atherosclerosis (ICD10-I70.0). Electronically Signed   By: Anner Crete M.D.   On: 12/07/2019 20:50   DG Chest Portable 1 View  Result Date: 12/30/2019 CLINICAL DATA:  Generalized weakness. EXAM: PORTABLE CHEST 1 VIEW COMPARISON:  12/07/2019 FINDINGS: 8:03 p.m. The cardio pericardial silhouette is enlarged. Interstitial markings are diffusely coarsened with chronic features. The lungs are clear without focal pneumonia, edema, pneumothorax or pleural effusion. The visualized bony structures of the thorax are intact. Lucency superimposed on the right hemidiaphragm is probably bowel gas given similarity to the prior study. IMPRESSION: 1. Cardiomegaly without acute cardiopulmonary findings. 2. Lucency over the right hemidiaphragm is probably within bowel, but two-view abdomen recommended to exclude intraperitoneal free air. Electronically Signed   By: Misty Stanley M.D.   On: 12/30/2019 20:20   DG Abd 2 Views  Result Date: 12/30/2019 CLINICAL DATA:  Diarrhea, abdominal distension and discomfort EXAM: ABDOMEN - 2 VIEW COMPARISON:  CT abdomen pelvis 12/07/2019 FINDINGS: Diffuse gaseous distention of the bowel throughout the abdomen. No visible subdiaphragmatic free air. Vascular calcifications project over the right kidney and pelvis. Moderate bilateral hip arthrosis. Streaky opacities in the lung bases, likely atelectasis. Aortic valve stent graft repair is noted. Thoracic aortic calcifications seen as well. IMPRESSION: Diffuse gaseous distention of the bowel throughout the abdomen, nonspecific and similar to comparison CT though early obstruction with active distal decompression could present  with these findings. No visible subdiaphragmatic free air. Extensive vascular calcium. Electronically Signed   By: Lovena Le M.D.   On: 12/30/2019 21:52   DG Hip Unilat W or Wo Pelvis 2-3 Views  Left  Result Date: 12/07/2019 CLINICAL DATA:  Fall, left hip pain EXAM: DG HIP (WITH OR WITHOUT PELVIS) 2-3V LEFT COMPARISON:  None. FINDINGS: Symmetric degenerative changes in the hips bilaterally with joint space narrowing and spurring. No acute bony abnormality. Specifically, no fracture, subluxation, or dislocation. Vascular calcifications present. IMPRESSION: No acute bony abnormality. Electronically Signed   By: Rolm Baptise M.D.   On: 12/07/2019 18:27     Subjective: Feeling okay, denies abdominal pain. Some urgency to use bathroom after taking medication but more semi-formed now less liquid only. Denies chest pains or SOB. Still feeling a bit weak. Did eat more yesterday and slept well.   Discharge Exam: Vitals:   01/03/20 0545 01/03/20 1155  BP: 120/76 117/82  Pulse: 83   Resp: 19 20  Temp: 97.9 F (36.6 C) 98 F (36.7 C)  SpO2: 92% 98%   Vitals:   01/02/20 0821 01/02/20 1945 01/03/20 0545 01/03/20 1155  BP: 130/65 122/80 120/76 117/82  Pulse: (!) 50 74 83   Resp: 15 18 19 20   Temp: 97.8 F (36.6 C) 97.7 F (36.5 C) 97.9 F (36.6 C) 98 F (36.7 C)  TempSrc: Oral Oral Oral Oral  SpO2: 94% 94% 92% 98%  Weight:      Height:        General: Pt is alert, awake, not in acute distress Cardiovascular: RRR, S1/S2 +, no rubs, no gallops Respiratory: CTA bilaterally, no wheezing, no rhonchi Abdominal: Soft, NT, ND, bowel sounds + Extremities: no edema, no cyanosis  The results of significant diagnostics from this hospitalization (including imaging, microbiology, ancillary and laboratory) are listed below for reference.     Microbiology: Recent Results (from the past 240 hour(s))  Culture, blood (routine x 2)     Status: None (Preliminary result)   Collection Time: 12/30/19 10:45 PM   Specimen: BLOOD  Result Value Ref Range Status   Specimen Description BLOOD LEFT ANTECUBITAL  Final   Special Requests   Final    BOTTLES DRAWN AEROBIC AND ANAEROBIC Blood Culture  adequate volume   Culture   Final    NO GROWTH 4 DAYS Performed at [facility], [address] [place], [place] [address]    Report Status PENDING  Incomplete  Culture, blood (routine x 2)     Status: None (Preliminary result)   Collection Time: 12/30/19 10:45 PM   Specimen: BLOOD  Result Value Ref Range Status   Specimen Description BLOOD RIGHT ANTECUBITAL  Final   Special Requests   Final    BOTTLES DRAWN AEROBIC AND ANAEROBIC Blood Culture adequate volume   Culture   Final    NO GROWTH 4 DAYS Performed at [facility], [place] [address] [place], [place] [address]    Report Status PENDING  Incomplete  Respiratory Panel by RT PCR (Flu A&B, Covid) - Nasopharyngeal Swab     Status: None   Collection Time: 12/30/19 11:22 PM   Specimen: Nasopharyngeal Swab  Result Value Ref Range Status   SARS Coronavirus 2 by RT PCR NEGATIVE NEGATIVE Final    Comment: (NOTE) SARS-CoV-2 target nucleic acids are NOT DETECTED. The SARS-CoV-2 RNA is generally detectable in upper respiratoy specimens during the acute phase of infection. The lowest concentration of SARS-CoV-2 viral copies  this assay can detect is 131 copies/mL. A negative result does not preclude SARS-Cov-2 infection and should not be used as the sole basis for treatment or other patient management decisions. A negative result may occur with  improper specimen collection/handling, submission of specimen other than nasopharyngeal swab, presence of viral mutation(s) within the areas targeted by this assay, and inadequate number of viral copies (<131 copies/mL). A negative result must be combined with clinical observations, patient history, and epidemiological information. The expected result is Negative. Fact Sheet for Patients:  PinkCheek.be Fact Sheet for Healthcare Providers:  GravelBags.it This test is not yet ap proved or cleared by the [place] FDA and   has been authorized for detection and/or diagnosis of SARS-CoV-2 by FDA under an Emergency Use Authorization (EUA). This EUA will remain  in effect (meaning this test can be used) for the duration of the COVID-19 declaration under Section 564(b)(1) of the Act, 21 U.S.C. section 360bbb-3(b)(1), unless the authorization is terminated or revoked sooner.    Influenza A by PCR NEGATIVE NEGATIVE Final   Influenza B by PCR NEGATIVE NEGATIVE Final    Comment: (NOTE) The Xpert Xpress SARS-CoV-2/FLU/RSV assay is intended as an aid in  the diagnosis of influenza from Nasopharyngeal swab specimens and  should not be used as a sole basis for treatment. Nasal washings and  aspirates are unacceptable for Xpert Xpress SARS-CoV-2/FLU/RSV  testing. Fact Sheet for Patients: PinkCheek.be Fact Sheet for Healthcare Providers: GravelBags.it This test is not yet approved or cleared by the [place] FDA and  has been authorized for detection and/or diagnosis of SARS-CoV-2 by  FDA under an Emergency Use Authorization (EUA). This EUA will remain  in effect (meaning this test can be used) for the duration of the  Covid-19 declaration under Section 564(b)(1) of the Act, 21  U.S.C. section 360bbb-3(b)(1), unless the authorization is  terminated or revoked. Performed at [facility], [place] [address], [place], [place] [address]   C Difficile Quick Screen w PCR reflex     Status: Abnormal   Collection Time: 12/31/19 10:21 AM   Specimen: STOOL  Result Value Ref Range Status   C Diff antigen NEGATIVE NEGATIVE Final   C Diff toxin POSITIVE (A) NEGATIVE Final   C Diff interpretation Results are indeterminate. See PCR results.  Final    Comment: Performed at [facility], [place] [address] [place], [place] [address]  C. Diff by PCR, Reflexed     Status: Abnormal   Collection Time: 12/31/19 10:21 AM  Result Value Ref Range Status   Toxigenic C.  Difficile by PCR POSITIVE (A) NEGATIVE Final    Comment: Positive for toxigenic C. difficile with little to no toxin production. Only treat if clinical presentation suggests symptomatic illness. Performed at [facility], [place] [address] [place], [place] [address]   Culture, Urine     Status: None   Collection Time: 01/01/20  7:44 AM   Specimen: Urine, Random  Result Value Ref Range Status   Specimen Description URINE, RANDOM  Final   Special Requests   Final    NONE Performed at [facility], [place] [address] [place], [place] [address]    Culture NO GROWTH  Final   Report Status 01/02/2020 FINAL  Final     Labs: BNP (last 3 results) Recent Labs    12/07/19 1757  BNP [id number]*   Basic Metabolic Panel: Recent Labs  Lab 12/30/19 2002 12/30/19 2002 12/31/19 [id number]  01/01/20 0803 01/01/20 1603 01/02/20 0345 01/03/20 0216  NA 137  --  137 136  --  139 139  K 3.3*   < > 3.2* 3.1* 3.5 4.0 3.6  CL 103  --  101 103  --  102 105  CO2 27  --  27 25  --  24 26  GLUCOSE 122*  --  109* 142*  --  110* 93  BUN 17  --  18 20  --  17 18  CREATININE 1.60*  --  1.58* 1.22  --  1.22 1.17  CALCIUM 8.1*  --  7.7* 7.9*  --  8.2* 8.1*  MG  --   --   --   --   --  1.9  --    < > = values in this interval not displayed.   Liver Function Tests: Recent Labs  Lab 12/30/19 2002 12/31/19 0415  AST 23 21  ALT 10 12  ALKPHOS 48 39  BILITOT 1.4* 0.7  PROT 5.3* 4.5*  ALBUMIN 2.5* 2.1*   No results for input(s): LIPASE, AMYLASE in the last 168 hours. No results for input(s): AMMONIA in the last 168 hours. CBC: Recent Labs  Lab 12/28/19 1612 12/30/19 2002 12/31/19 0415 01/02/20 0345  WBC 9.6 18.7* 13.3* 16.0*  NEUTROABS 6.4  --   --  14.9*  HGB 12.5* 12.1* 11.0* 11.2*  HCT 37.3* 36.8* 33.2* 33.8*  MCV 94.4 96.1 96.2 94.9  PLT 176.0 119* 94* 126*   Cardiac Enzymes: No results for input(s): CKTOTAL, CKMB, CKMBINDEX, TROPONINI in the last 168 hours. BNP: Invalid input(s):  POCBNP CBG: No results for input(s): GLUCAP in the last 168 hours. D-Dimer No results for input(s): DDIMER in the last 72 hours. Hgb A1c No results for input(s): HGBA1C in the last 72 hours. Lipid Profile No results for input(s): CHOL, HDL, LDLCALC, TRIG, CHOLHDL, LDLDIRECT in the last 72 hours. Thyroid function studies No results for input(s): TSH, T4TOTAL, T3FREE, THYROIDAB in the last 72 hours.  Invalid input(s): FREET3 Anemia work up No results for input(s): VITAMINB12, FOLATE, FERRITIN, TIBC, IRON, RETICCTPCT in the last 72 hours. Urinalysis    Component Value Date/Time   COLORURINE AMBER (A) 12/30/2019 2339   APPEARANCEUR HAZY (A) 12/30/2019 2339   APPEARANCEUR Cloudy (A) 06/15/2018 1234   LABSPEC 1.023 12/30/2019 2339   PHURINE 5.0 12/30/2019 2339   GLUCOSEU NEGATIVE 12/30/2019 2339   HGBUR NEGATIVE 12/30/2019 2339   BILIRUBINUR NEGATIVE 12/30/2019 2339   BILIRUBINUR Negative 06/15/2018 [place] 12/30/2019 2339   PROTEINUR 30 (A) 12/30/2019 2339   UROBILINOGEN 0.2 05/18/2015 1420   UROBILINOGEN 0.2 05/19/2014 1626   NITRITE NEGATIVE 12/30/2019 2339   LEUKOCYTESUR NEGATIVE 12/30/2019 2339   Sepsis Labs Invalid input(s): PROCALCITONIN,  WBC,  LACTICIDVEN Microbiology Recent Results (from the past 240 hour(s))  Culture, blood (routine x 2)     Status: None (Preliminary result)   Collection Time: 12/30/19 10:45 PM   Specimen: BLOOD  Result Value Ref Range Status   Specimen Description BLOOD LEFT ANTECUBITAL  Final   Special Requests   Final    BOTTLES DRAWN AEROBIC AND ANAEROBIC Blood Culture adequate volume   Culture   Final    NO GROWTH 4 DAYS Performed at [facility], [place] [address], [place], [place] [address]    Report Status PENDING  Incomplete  Culture, blood (routine x 2)     Status: None (Preliminary result)   Collection Time: 12/30/19 10:45  PM   Specimen: BLOOD  Result Value Ref Range Status   Specimen Description BLOOD RIGHT  ANTECUBITAL  Final   Special Requests   Final    BOTTLES DRAWN AEROBIC AND ANAEROBIC Blood Culture adequate volume   Culture   Final    NO GROWTH 4 DAYS Performed at [facility], [address] [place], [place] [address]    Report Status PENDING  Incomplete  Respiratory Panel by RT PCR (Flu A&B, Covid) - Nasopharyngeal Swab     Status: None   Collection Time: 12/30/19 11:22 PM   Specimen: Nasopharyngeal Swab  Result Value Ref Range Status   SARS Coronavirus 2 by RT PCR NEGATIVE NEGATIVE Final    Comment: (NOTE) SARS-CoV-2 target nucleic acids are NOT DETECTED. The SARS-CoV-2 RNA is generally detectable in upper respiratoy specimens during the acute phase of infection. The lowest concentration of SARS-CoV-2 viral copies this assay can detect is 131 copies/mL. A negative result does not preclude SARS-Cov-2 infection and should not be used as the sole basis for treatment or other patient management decisions. A negative result may occur with  improper specimen collection/handling, submission of specimen other than nasopharyngeal swab, presence of viral mutation(s) within the areas targeted by this assay, and inadequate number of viral copies (<131 copies/mL). A negative result must be combined with clinical observations, patient history, and epidemiological information. The expected result is Negative. Fact Sheet for Patients:  PinkCheek.be Fact Sheet for Healthcare Providers:  GravelBags.it This test is not yet ap proved or cleared by the [place] FDA and  has been authorized for detection and/or diagnosis of SARS-CoV-2 by FDA under an Emergency Use Authorization (EUA). This EUA will remain  in effect (meaning this test can be used) for the duration of the COVID-19 declaration under Section 564(b)(1) of the Act, 21 U.S.C. section 360bbb-3(b)(1), unless the authorization is terminated or revoked sooner.     Influenza A by PCR NEGATIVE NEGATIVE Final   Influenza B by PCR NEGATIVE NEGATIVE Final    Comment: (NOTE) The Xpert Xpress SARS-CoV-2/FLU/RSV assay is intended as an aid in  the diagnosis of influenza from Nasopharyngeal swab specimens and  should not be used as a sole basis for treatment. Nasal washings and  aspirates are unacceptable for Xpert Xpress SARS-CoV-2/FLU/RSV  testing. Fact Sheet for Patients: PinkCheek.be Fact Sheet for Healthcare Providers: GravelBags.it This test is not yet approved or cleared by the [place] FDA and  has been authorized for detection and/or diagnosis of SARS-CoV-2 by  FDA under an Emergency Use Authorization (EUA). This EUA will remain  in effect (meaning this test can be used) for the duration of the  Covid-19 declaration under Section 564(b)(1) of the Act, 21  U.S.C. section 360bbb-3(b)(1), unless the authorization is  terminated or revoked. Performed at [facility], [place] [address] [place], [place] [address]   C Difficile Quick Screen w PCR reflex     Status: Abnormal   Collection Time: 12/31/19 10:21 AM   Specimen: STOOL  Result Value Ref Range Status   C Diff antigen NEGATIVE NEGATIVE Final   C Diff toxin POSITIVE (A) NEGATIVE Final   C Diff interpretation Results are indeterminate. See PCR results.  Final    Comment: Performed at [facility], [place] [address] [place], [place] [address]  C. Diff by PCR, Reflexed     Status: Abnormal   Collection Time: 12/31/19 10:21 AM  Result Value Ref Range Status   Toxigenic  C. Difficile by PCR POSITIVE (A) NEGATIVE Final    Comment: Positive for toxigenic C. difficile with little to no toxin production. Only treat if clinical presentation suggests symptomatic illness. Performed at [facility], [place] [address], [place], [place] [address]   Culture, Urine     Status: None   Collection Time: 01/01/20  7:44 AM   Specimen:  Urine, Random  Result Value Ref Range Status   Specimen Description URINE, RANDOM  Final   Special Requests   Final    NONE Performed at [facility], [place] [address], [place], [place] [address]    Culture NO GROWTH  Final   Report Status 01/02/2020 FINAL  Final   Time coordinating discharge: Over 30 minutes  SIGNED:  Hoyt Koch, MD  [facility] 01/03/2020, 12:06 PM Pager   If 7PM-7AM, please contact night-coverage [email or web address] Password [email or web address]

## 2020-01-03 NOTE — Progress Notes (Signed)
[facility] for Warfarin Indication: atrial fibrillation  Allergies  Allergen Reactions  . Levofloxacin Rash    "thought they were sticking swords thru me"  . Klor-Con [Potassium Chloride Er] Diarrhea    Patient Measurements: Height: 6' (182.9 cm) Weight: 181 lb (82.1 kg) IBW/kg (Calculated) : 77.6  Vital Signs: Temp: 97.9 F (36.6 C) (02/09 0545) Temp Source: Oral (02/09 0545) BP: 120/76 (02/09 0545) Pulse Rate: 83 (02/09 0545)  Labs: Recent Labs    01/01/20 0310 01/01/20 0803 01/02/20 0345 01/03/20 0216  HGB  --   --  11.2*  --   HCT  --   --  33.8*  --   PLT  --   --  126*  --   LABPROT 34.6*  --  21.8* 21.2*  INR 3.4*  --  1.9* 1.8*  CREATININE  --  1.22 1.22 1.17    Estimated Creatinine Clearance: 44.2 mL/min (by C-G formula based on SCr of 1.17 mg/dL).   Medical History: Past Medical History:  Diagnosis Date  . Atrial fibrillation, chronic (HCC)    a. on coumadin   . Carotid artery occlusion    right total internal artery occlusion  . Chronic diastolic CHF (congestive heart failure) ([place])   . Coronary artery disease    s/p cardiac cath in 1990s, and cardiolite study  in 2005 showing EF 54%  . Degenerative disc disease    cervical spine  . GERD (gastroesophageal reflux disease)   . HIV infection ([place])   . Hyperlipidemia   . Hypertension   . Hypothyroidism   . Rheumatoid arthritis ([place])   . S/P TAVR (transcatheter aortic valve replacement)    Edwards Sapien 3 THV (size 29 mm) via the R TF approach   . Skin cancer    forehead  . Stroke [facility])    TIA   . TIA (transient ischemic attack)   . Urgency incontinence       Assessment: 86 y.o. presents with fever/diarrhea - concern for intra-abd sepsis. Pt on warfarin PTA for afib. Home dose: 2.5mg  Mon and Fri and 5mg  all other days.  -INR= 1.8  Goal of Therapy:  INR 2-3 Monitor platelets by anticoagulation protocol: Yes   Plan:  -Coumadin 5mg  today -Daily  PT/INR  Hildred Laser, PharmD Clinical Pharmacist **Pharmacist phone directory can now be found on [email or web address] (PW [email or web address]).  Listed under [place].

## 2020-01-03 NOTE — TOC Transition Note (Signed)
Transition of Care [facility]) - CM/SW Discharge Note   Patient Details  Name: [name] [name] MRN: [id number] Date of Birth: 29-Jan-1925  [facility] of [facility]) CM/SW Contact:  Vinie Sill, [place] Phone Number: 01/03/2020, 1:38 PM   Clinical Narrative:     Patient will DC to: [facility]  DC Date: 01/03/2020 Family Notified: [name], daughter  Transport By: Corey Harold @ 2:30pm  RN, patient, and facility notified of DC. Discharge Summary sent to facility. RN given number for [phone], Room 3221. Ambulance transport requested for patient.   Clinical Social Worker signing off. Thurmond Butts, MSW, LCSWA Clinical Social Worker    Final next level of care: Skilled Nursing Facility Barriers to Discharge: Continued Medical Work up, SNF Pending bed offer, Insurance Authorization   Patient Goals and CMS Choice Patient states their goals for this hospitalization and ongoing recovery are:: Pt's Daughter stated " to be able to walk". CMS Medicare.gov Compare Post Acute Care list provided to:: Patient Choice offered to / list presented to : Adult Children(Gave daughter web address to review for SNF facility.)  Discharge Placement PASRR number recieved: 01/02/20            Patient chooses bed at: [facility] Patient to be transferred to facility by: [facility] Name of family member notified: [name], daughter Patient and family notified of of transfer: 01/03/20  Discharge Plan and Services                                     Social Determinants of Health (SDOH) Interventions     Readmission Risk Interventions No flowsheet data found.

## 2020-01-03 NOTE — Telephone Encounter (Signed)
Spoke to [employer] and she stated she would like to speak to [place]. She stated that Tommi Rumps advised that if anything changes to call him back. She also stated that pt is in the hospital.

## 2020-01-04 ENCOUNTER — Other Ambulatory Visit: Payer: Self-pay | Admitting: *Deleted

## 2020-01-04 LAB — GI PATHOGEN PANEL BY PCR, STOOL

## 2020-01-04 LAB — CULTURE, BLOOD (ROUTINE X 2)
Culture: NO GROWTH
Culture: NO GROWTH
Special Requests: ADEQUATE
Special Requests: ADEQUATE

## 2020-01-04 NOTE — Patient Outreach (Signed)
[facility]) Care Management  01/04/2020  [name] [name] [birth year] [id number]   RN Health Coach Case Closure  Referral Date: 01/17/2019 Referral Source: Transfer from [place] Reason for Referral: Continued Disease Management Education Insurance:[employer]   Outreach Attempt:  Patient discharged to [facility] on 01/03/2020.    Plan:  RN Health Coach will close case at this time based on patient in [place].  RN Health Coach will send provider Case Closure Letter.  RN Health Coach will send patient Case Closure Letter.  [place] [phone] [email or web address]

## 2020-01-04 NOTE — Telephone Encounter (Signed)
Left message again for someone from [employer] to return phone call.

## 2020-01-09 ENCOUNTER — Telehealth: Payer: Self-pay | Admitting: Adult Health

## 2020-01-09 NOTE — Telephone Encounter (Signed)
[name] [name] from [employer] stated that they faxed over a Cardiac Risk Assessment on 12/29/19. He was calling to see if it was received and they need a signature from [employer] and faxed back.   [id number]  Phone: [phone]

## 2020-01-10 ENCOUNTER — Ambulatory Visit: Payer: Medicare Other | Admitting: Podiatry

## 2020-01-10 NOTE — Telephone Encounter (Signed)
Have you received these forms?

## 2020-01-11 ENCOUNTER — Other Ambulatory Visit: Payer: Self-pay | Admitting: Cardiology

## 2020-01-11 ENCOUNTER — Ambulatory Visit: Payer: Medicare HMO

## 2020-01-11 NOTE — Telephone Encounter (Signed)
The PPW was a scam. Pt notified of update. No further action needed.

## 2020-01-20 ENCOUNTER — Telehealth: Payer: Self-pay

## 2020-01-20 ENCOUNTER — Telehealth: Payer: Self-pay | Admitting: Hematology

## 2020-01-20 NOTE — Telephone Encounter (Signed)
Scheduled appt per 2/26 sch message - pt daughter aware of appt date and time

## 2020-01-20 NOTE — Telephone Encounter (Signed)
Patient's daughter called and stated patient was recently discharged from the hospital and is in rehab so she would like to reschedule PET scan that is scheduled for 01/31/20. PET scan rescheduled to 02/22/20 at 1:00 per daughter's request and lab appointment scheduled for 12:00 on the same day. Scheduling message sent to schedule patient for a follow up visit with Dr. Irene Limbo within 2-3 days after the PET scan. Patient's daughter aware to expect a call from [employer]. Dr. Irene Limbo made aware of changes.

## 2020-01-24 ENCOUNTER — Telehealth: Payer: Self-pay | Admitting: Adult Health

## 2020-01-24 NOTE — Telephone Encounter (Signed)
Spoke to Will and gave authorization for OT.  Nothing further needed.

## 2020-01-24 NOTE — Telephone Encounter (Signed)
Ok for verbal orders ?

## 2020-01-24 NOTE — Telephone Encounter (Signed)
Will from [facility] is requesting verbal orders for occupational therapy 2 times a week for 3 weeks.   Phone:[phone]

## 2020-01-25 ENCOUNTER — Other Ambulatory Visit: Payer: Medicare Other

## 2020-01-26 ENCOUNTER — Other Ambulatory Visit: Payer: Self-pay | Admitting: *Deleted

## 2020-01-26 NOTE — Patient Outreach (Signed)
[facility]) Care Management  01/26/2020  IMIR BUTZ 1925/06/27 [id number]   General Discharge EMMI red alert   Day :4 Date :01/25/20 Reason :  Who reached Patient  Got discharge papers? I Don't Know  Know who to call about changes in condition? No    Subjective: Outreach call to patient home number,no answer able to leave a HIPP compliant message for return call.  Placed call to patient daughter Jettie Booze, Designated party release, explained purpose of the call , voice activated response calls  after discharge and triggered that may alert follow up call to patient. Daughter [name] states that he may not have understood the questions. She discussed that she and her sister fill patient pill organizer with recent  recent discharge instructions.  She discussed patient recent hospital admission for CDiff ,sepsis hypotension, weakness and discharge to [facility] for rehab.Daughter reports that she and her sister help with providing patient meals , transportation to  medical appointments , they assistance arranged with a friend that is an  EMT and  checks on patient each evening , warming his evening meal.   [name] reports patient is scheduled for his initial covid 19 vaccine on Saturday. She has been encouraging him to stay hydrated , eating meals as he needs encouragement .  Discussed with daughter patient previous involvement with [facility] care management and health coach prior to admission. Explained [facility] care management follow up after recent admission to hospital and rehab stay. She states she would appreciate any support to help with managing patient with his many health conditions of heart failure, atrial fib especially concern regarding recent admission to prevent going back to hospital.  She discussed that she will follow up regarding patient visit with PCP after discharge she notes patient has coumadin visit in next week she will contact office regarding visit at that time but  reports that patient has all medication and taking as placed in organizer.   Patient has had initial visit from [facility] .  She requested that I try patient again as he may have been busy.  1430 Returned call to patient , explained reason for the call , [facility] care management services for managing chronic conditions. Patient recalls previous calls from [place] prior to going to hospital . Patient discussed using walker in home, he denies shortness of breath, swelling , dizziness, he reports weighing sometimes but has not today. Reinforced nutrition and hydration he states he understands and they his daughter he been encouraging him to drink and eat. Patient denies having diarrhea , reports he continues to eat yogurt at least once daily.  Patient agreeable to continued [facility] care management  follow up post discharge for continued education , support for managing medical conditions.   Plan Will plan follow up with patient after discharge in transition of care.  Will plan call in the next week for continued assessment .     Joylene Draft, RN, BSN  [place] Management Coordinator  [phone]- Mobile [phone]- Toll Free Main Office

## 2020-01-30 ENCOUNTER — Inpatient Hospital Stay (HOSPITAL_COMMUNITY)
Admission: EM | Admit: 2020-01-30 | Discharge: 2020-02-03 | DRG: 372 | Disposition: A | Discharge status: Skilled Nursing Facility | Payer: Medicare PPO | Attending: Internal Medicine | Admitting: Internal Medicine

## 2020-01-30 ENCOUNTER — Telehealth: Payer: Self-pay | Admitting: Adult Health

## 2020-01-30 ENCOUNTER — Encounter (HOSPITAL_COMMUNITY): Payer: Self-pay

## 2020-01-30 DIAGNOSIS — E860 Dehydration: Secondary | ICD-10-CM | POA: Diagnosis present

## 2020-01-30 DIAGNOSIS — I480 Paroxysmal atrial fibrillation: Secondary | ICD-10-CM | POA: Diagnosis present

## 2020-01-30 DIAGNOSIS — Z952 Presence of prosthetic heart valve: Secondary | ICD-10-CM

## 2020-01-30 DIAGNOSIS — C439 Malignant melanoma of skin, unspecified: Secondary | ICD-10-CM | POA: Diagnosis present

## 2020-01-30 DIAGNOSIS — R197 Diarrhea, unspecified: Secondary | ICD-10-CM

## 2020-01-30 DIAGNOSIS — J449 Chronic obstructive pulmonary disease, unspecified: Secondary | ICD-10-CM | POA: Diagnosis present

## 2020-01-30 DIAGNOSIS — I2510 Atherosclerotic heart disease of native coronary artery without angina pectoris: Secondary | ICD-10-CM | POA: Diagnosis present

## 2020-01-30 DIAGNOSIS — Z888 Allergy status to other drugs, medicaments and biological substances status: Secondary | ICD-10-CM

## 2020-01-30 DIAGNOSIS — I6521 Occlusion and stenosis of right carotid artery: Secondary | ICD-10-CM | POA: Diagnosis present

## 2020-01-30 DIAGNOSIS — I959 Hypotension, unspecified: Secondary | ICD-10-CM | POA: Diagnosis present

## 2020-01-30 DIAGNOSIS — M069 Rheumatoid arthritis, unspecified: Secondary | ICD-10-CM | POA: Diagnosis present

## 2020-01-30 DIAGNOSIS — I5042 Chronic combined systolic (congestive) and diastolic (congestive) heart failure: Secondary | ICD-10-CM | POA: Diagnosis present

## 2020-01-30 DIAGNOSIS — D539 Nutritional anemia, unspecified: Secondary | ICD-10-CM

## 2020-01-30 DIAGNOSIS — Z87891 Personal history of nicotine dependence: Secondary | ICD-10-CM

## 2020-01-30 DIAGNOSIS — T45515A Adverse effect of anticoagulants, initial encounter: Secondary | ICD-10-CM | POA: Diagnosis present

## 2020-01-30 DIAGNOSIS — R791 Abnormal coagulation profile: Secondary | ICD-10-CM | POA: Diagnosis present

## 2020-01-30 DIAGNOSIS — E876 Hypokalemia: Secondary | ICD-10-CM

## 2020-01-30 DIAGNOSIS — I482 Chronic atrial fibrillation, unspecified: Secondary | ICD-10-CM | POA: Diagnosis present

## 2020-01-30 DIAGNOSIS — Z7901 Long term (current) use of anticoagulants: Secondary | ICD-10-CM

## 2020-01-30 DIAGNOSIS — Z79899 Other long term (current) drug therapy: Secondary | ICD-10-CM

## 2020-01-30 DIAGNOSIS — Z89421 Acquired absence of other right toe(s): Secondary | ICD-10-CM

## 2020-01-30 DIAGNOSIS — A0471 Enterocolitis due to Clostridium difficile, recurrent: Principal | ICD-10-CM | POA: Diagnosis present

## 2020-01-30 DIAGNOSIS — Z8349 Family history of other endocrine, nutritional and metabolic diseases: Secondary | ICD-10-CM

## 2020-01-30 DIAGNOSIS — I110 Hypertensive heart disease with heart failure: Secondary | ICD-10-CM | POA: Diagnosis present

## 2020-01-30 DIAGNOSIS — D509 Iron deficiency anemia, unspecified: Secondary | ICD-10-CM | POA: Diagnosis present

## 2020-01-30 DIAGNOSIS — N400 Enlarged prostate without lower urinary tract symptoms: Secondary | ICD-10-CM | POA: Diagnosis present

## 2020-01-30 DIAGNOSIS — R195 Other fecal abnormalities: Secondary | ICD-10-CM

## 2020-01-30 DIAGNOSIS — Z20822 Contact with and (suspected) exposure to covid-19: Secondary | ICD-10-CM | POA: Diagnosis present

## 2020-01-30 DIAGNOSIS — I4891 Unspecified atrial fibrillation: Secondary | ICD-10-CM | POA: Diagnosis present

## 2020-01-30 DIAGNOSIS — Z66 Do not resuscitate: Secondary | ICD-10-CM | POA: Diagnosis present

## 2020-01-30 DIAGNOSIS — Z8249 Family history of ischemic heart disease and other diseases of the circulatory system: Secondary | ICD-10-CM

## 2020-01-30 DIAGNOSIS — N179 Acute kidney failure, unspecified: Secondary | ICD-10-CM

## 2020-01-30 DIAGNOSIS — Z8673 Personal history of transient ischemic attack (TIA), and cerebral infarction without residual deficits: Secondary | ICD-10-CM

## 2020-01-30 DIAGNOSIS — E785 Hyperlipidemia, unspecified: Secondary | ICD-10-CM | POA: Diagnosis present

## 2020-01-30 DIAGNOSIS — D6832 Hemorrhagic disorder due to extrinsic circulating anticoagulants: Secondary | ICD-10-CM

## 2020-01-30 DIAGNOSIS — K219 Gastro-esophageal reflux disease without esophagitis: Secondary | ICD-10-CM | POA: Diagnosis present

## 2020-01-30 DIAGNOSIS — E039 Hypothyroidism, unspecified: Secondary | ICD-10-CM | POA: Diagnosis present

## 2020-01-30 MED ORDER — SODIUM CHLORIDE 0.9 % IV BOLUS
1000.0000 mL | Freq: Once | INTRAVENOUS | Status: AC
  Administered 2020-01-31: 1000 mL via INTRAVENOUS

## 2020-01-30 NOTE — Telephone Encounter (Signed)
Patient is coming in Wednesday to see Jenny Reichmann for coumadin at 3:30 the daughter was wanting to know if Tommi Rumps can see him that same day while he's here.  Please advise

## 2020-01-30 NOTE — ED Provider Notes (Signed)
[facility] EMERGENCY DEPARTMENT Provider Note   CSN: [id number] Arrival date & time: 01/30/20  2335   History Chief Complaint  Patient presents with  . Diarrhea    [name] [name] is a 86 y.o. male.  The history is provided by the patient.  Diarrhea He has history of hypertension, hyperlipidemia, stroke, coronary artery disease, diastolic heart failure, chronic atrial fibrillation anticoagulated on warfarin and comes in because of ongoing diarrhea and weakness.  He had been hospitalized 1 month ago with C. difficile colitis and states that diarrhea never stopped.  He is having almost constant diarrhea.  He has been feeling weak, but today he felt so weak that he thought he could not get up.  He denies nausea or vomiting and denies fever chills.  Denies abdominal pain.  Past Medical History:  Diagnosis Date  . Atrial fibrillation, chronic (HCC)    a. on coumadin   . Carotid artery occlusion    right total internal artery occlusion  . Chronic diastolic CHF (congestive heart failure) ([place])   . Coronary artery disease    s/p cardiac cath in 1990s, and cardiolite study  in 2005 showing EF 54%  . Degenerative disc disease    cervical spine  . GERD (gastroesophageal reflux disease)   . HIV infection ([place])   . Hyperlipidemia   . Hypertension   . Hypothyroidism   . Rheumatoid arthritis ([place])   . S/P TAVR (transcatheter aortic valve replacement)    Edwards Sapien 3 THV (size 29 mm) via the R TF approach   . Skin cancer    forehead  . Stroke [facility])    TIA   . TIA (transient ischemic attack)   . Urgency incontinence     Patient Active Problem List   Diagnosis Date Noted  . Chronic systolic CHF (congestive heart failure) ([place]) 12/31/2019  . Malignant melanoma of skin of back ([place]) 12/31/2019  . Rheumatoid arthritis ([place]) 12/31/2019  . AKI (acute kidney injury) ([place]) 12/31/2019  . Swelling 07/29/2019  . S/P TAVR (transcatheter aortic valve replacement)   .  Urinary frequency 06/10/2018  . Lower respiratory infection 12/18/2017  . Iron deficiency anemia 12/16/2016  . Severe aortic stenosis 04/10/2015  . Chronic anticoagulation 02/17/2015  . Bilateral carotid artery disease ([place]) 09/12/2014  . Acute on chronic diastolic heart failure ([place]) 07/10/2014  . Hypothyroidism 07/04/2013  . Occlusion and stenosis of carotid artery without mention of cerebral infarction 05/18/2012  . Diarrhea 02/07/2012  . COPD with asthma ([place]) 01/26/2012  . Sepsis associated hypotension ([place]) 11/27/2011  . Long term (current) use of anticoagulants 02/24/2011  . Atrial fibrillation ([place]) 01/12/2009  . HLD (hyperlipidemia) 06/15/2007  . Benign essential HTN 06/15/2007    Past Surgical History:  Procedure Laterality Date  . AMPUTATION Right 02/21/2015   Procedure: Right 3rd Ray Amputation;  Surgeon: Newt Minion, MD;  Location: [place];  Service: Orthopedics;  Laterality: Right;  Digital block and ankle block with MAC  . CARDIAC CATHETERIZATION  1990s  . CATARACT EXTRACTION W/ INTRAOCULAR LENS  IMPLANT, BILATERAL    . EYE SURGERY     cataracts bilateral  . INTRAOPERATIVE TRANSTHORACIC ECHOCARDIOGRAM  09/14/2018   Procedure: INTRAOPERATIVE TRANSTHORACIC ECHOCARDIOGRAM;  Surgeon: Sherren Mocha, MD;  Location: [place];  Service: Open Heart Surgery;;  . RIGHT/LEFT HEART CATH AND CORONARY ANGIOGRAPHY N/A 07/21/2018   Procedure: RIGHT/LEFT HEART CATH AND CORONARY ANGIOGRAPHY;  Surgeon: Sherren Mocha, MD;  Location: [place] CV LAB;  Service: Cardiovascular;  Laterality: N/A;  . SKIN CANCER EXCISION  12/2011   forehead  . TRANSCATHETER AORTIC VALVE REPLACEMENT, TRANSFEMORAL N/A 09/14/2018   Procedure: TRANSCATHETER AORTIC VALVE REPLACEMENT, TRANSFEMORAL. 6m EDWARDS SAPIEN3 THV.;  Surgeon: CSherren Mocha MD;  [place]  Service: Open Heart Surgery;  Laterality: N/A;       Family History  Problem Relation Age of Onset  . Heart disease Mother   . Malignant  hyperthermia Mother   . Hypertension Mother   . Heart disease Father   . Malignant hyperthermia Father   . Hyperlipidemia Father   . Hypertension Father   . Heart disease Sister   . Hypertension Sister     Social History   Tobacco Use  . Smoking status: Former Smoker    Packs/day: 1.00    Years: 40.00    Pack years: 40.00    Types: Cigarettes    Quit date: 09/12/1989    Years since quitting: 30.4  . Smokeless tobacco: Never Used  . Tobacco comment: quit around 1992  Substance Use Topics  . Alcohol use: No    Alcohol/week: 0.0 standard drinks    Comment: former, quit drinking in 1992  . Drug use: No    Home Medications Prior to Admission medications   Medication Sig Start Date End Date Taking? Authorizing Provider  acetaminophen (TYLENOL) 325 MG tablet Take 2 tablets (650 mg total) by mouth every 6 (six) hours as needed for mild pain (or Fever >/= 101). 01/03/20   CHoyt Koch MD  atorvastatin (LIPITOR) 10 MG tablet TAKE 1 TABLET AT BEDTIME 01/11/20   NDorothy Spark MD  Calcium-Vitamin D (CALTRATE 600 PLUS-VIT D PO) Take 1 tablet by mouth at bedtime.     [provider]  folic acid (FOLVITE) 1 MG tablet Take 1 mg by mouth daily.  03/06/16   [provider]  furosemide (LASIX) 40 MG tablet Take 80 mg by mouth daily.    [provider]  guaiFENesin (MUCINEX) 600 MG 12 hr tablet Take 600-1,200 mg by mouth 2 (two) times daily as needed (cold/congestion.).    [provider]  methotrexate (RHEUMATREX) 2.5 MG tablet Take 10 mg by mouth once a week. Caution:Chemotherapy. Protect from light. On Saturdays    [provider]  Multiple Vitamin (MULTIVITAMIN WITH MINERALS) TABS tablet Take 1 tablet by mouth daily.    [provider]  nitroGLYCERIN (NITROSTAT) 0.4 MG SL tablet DISSOLVE 1 TABLET UNDER THE TONGUE EVERY 5 MINUTES AS NEEDED FOR CHEST PAIN Patient not taking: No sig reported 06/23/19   NDorothy Spark MD   potassium chloride SA (KLOR-CON) 20 MEQ tablet Take 2 tablets (40 mEq total) by mouth every morning. Patient taking differently: Take 20 mEq by mouth every morning.  12/20/19   NDorothy Spark MD  Probiotic Product (HEALTHY COLON PO) Take 1 capsule by mouth daily.    [provider]  tamsulosin (FLOMAX) 0.4 MG CAPS capsule Take 1 capsule (0.4 mg total) by mouth every evening. 05/11/19   NDorothy Spark MD  warfarin (COUMADIN) 5 MG tablet Take 1 tablet daily except take 1 1/2 tablets Mon Wed and Fridays or Take as directed by anticoagulation clinic 12/08/19   NDorothyann Peng NP    Allergies    Levofloxacin and Klor-con [potassium chloride er]  Review of Systems   Review of Systems  Gastrointestinal: Positive for diarrhea.  All other systems reviewed and are negative.   Physical Exam Updated Vital Signs BP (Marland Kitchen  91/59 (BP Location: Right Arm)   Pulse 81   Temp 98.3 F (36.8 C) (Oral)   Resp 18   Ht 6' (1.829 m)   Wt 60.8 kg   SpO2 98%   BMI 18.17 kg/m   Physical Exam Vitals and nursing note reviewed.   Frail-appearing 86 year old male, resting comfortably and in no acute distress. Vital signs are significant for low blood pressure. Oxygen saturation is 98%, which is normal. Head is normocephalic and atraumatic. PERRLA, EOMI. Oropharynx is clear. Neck is nontender and supple without adenopathy or JVD. Back is nontender and there is no CVA tenderness. Lungs are clear without rales, wheezes, or rhonchi. Chest is nontender. Heart has regular rate and rhythm without murmur. Abdomen is soft, flat, nontender without masses or hepatosplenomegaly and peristalsis is hypooactive. Extremities have no cyanosis or edema, full range of motion is present. Skin is warm and dry without rash.  Ichthyosis of skin of the lower legs. Neurologic: Mental status is normal, cranial nerves are intact, there are no motor or sensory deficits.  ED Results / Procedures / Treatments   Labs  (all labs ordered are listed, but only abnormal results are displayed) Labs Reviewed  COMPREHENSIVE METABOLIC PANEL - Abnormal; Notable for the following components:      Result Value   Potassium 3.0 (*)    Glucose, Bld 112 (*)    BUN 55 (*)    Creatinine, Ser 2.14 (*)    Calcium 8.5 (*)    Total Protein 5.3 (*)    Albumin 2.8 (*)    AST 13 (*)    GFR calc non Af Amer 26 (*)    GFR calc Af Amer 30 (*)    All other components within normal limits  CBC WITH DIFFERENTIAL/PLATELET - Abnormal; Notable for the following components:   RBC 3.92 (*)    Hemoglobin 12.7 (*)    MCV 101.0 (*)    RDW 15.6 (*)    Monocytes Absolute 1.2 (*)    Abs Immature Granulocytes 0.14 (*)    All other components within normal limits  PROTIME-INR - Abnormal; Notable for the following components:   Prothrombin Time 68.4 (*)    INR 8.2 (*)    All other components within normal limits  POC OCCULT BLOOD, ED - Abnormal; Notable for the following components:   Fecal Occult Bld POSITIVE (*)    All other components within normal limits  LACTIC ACID, PLASMA  POC OCCULT BLOOD, ED    EKG None  Radiology No results found.  Procedures Procedures   Medications Ordered in ED Medications  potassium chloride 10 mEq in 100 mL IVPB (has no administration in time range)  sodium chloride 0.9 % bolus 1,000 mL (0 mLs Intravenous Stopped 01/31/20 0058)    ED Course  I have reviewed the triage vital signs and the nursing notes.  Pertinent labs & imaging results that were available during my care of the patient were reviewed by me and considered in my medical decision making (see chart for details).  MDM Rules/Calculators/A&P Persistent diarrhea and weakness in patient with recent treatment of C. difficile colitis.  Old records reviewed confirming hospitalization for C. difficile colitis treated with oral vancomycin, discharged 01/03/2020.  He will be given IV fluids and will check screening labs.  Labs show  significant increase in creatinine and BUN compared with February 9.  Also, hypokalemia is noted, probably related to GI losses.  Stool is Hemoccult positive, but hemoglobin has increased -  probably secondary to hemoconcentration.  Lactic acid levels normal.  He apparently has problems with diarrhea from oral potassium so will be given IV potassium.  Case is discussed with Dr. Marlowe Sax of [facility] hospital[facility], who agrees to admit the patient.  Final Clinical Impression(s) / ED Diagnoses Final diagnoses:  Acute kidney injury (nontraumatic) (HCC)  Diarrhea, unspecified type  Hypokalemia  Macrocytic anemia  Warfarin-induced coagulopathy ([place])  Guaiac positive stools    Rx / DC Orders ED Discharge Orders    None       Delora Fuel, MD 16/10/96 [phone]

## 2020-01-30 NOTE — ED Triage Notes (Signed)
Pt came in [facility] from home with c/o of Diarrhea. Pt was seen here a couple weeks ago and was prescribed antidairreal medications and those have not been helping.  He lives at home alone and with no caregiver.

## 2020-01-31 ENCOUNTER — Telehealth: Payer: Self-pay | Admitting: Adult Health

## 2020-01-31 ENCOUNTER — Inpatient Hospital Stay (HOSPITAL_COMMUNITY): Payer: Medicare PPO

## 2020-01-31 ENCOUNTER — Ambulatory Visit (HOSPITAL_COMMUNITY): Payer: Medicare HMO

## 2020-01-31 ENCOUNTER — Other Ambulatory Visit: Payer: Self-pay | Admitting: *Deleted

## 2020-01-31 DIAGNOSIS — E860 Dehydration: Secondary | ICD-10-CM | POA: Diagnosis present

## 2020-01-31 DIAGNOSIS — E861 Hypovolemia: Secondary | ICD-10-CM

## 2020-01-31 DIAGNOSIS — T45515A Adverse effect of anticoagulants, initial encounter: Secondary | ICD-10-CM

## 2020-01-31 DIAGNOSIS — Z952 Presence of prosthetic heart valve: Secondary | ICD-10-CM | POA: Diagnosis not present

## 2020-01-31 DIAGNOSIS — N179 Acute kidney failure, unspecified: Secondary | ICD-10-CM | POA: Diagnosis present

## 2020-01-31 DIAGNOSIS — I4819 Other persistent atrial fibrillation: Secondary | ICD-10-CM

## 2020-01-31 DIAGNOSIS — M069 Rheumatoid arthritis, unspecified: Secondary | ICD-10-CM | POA: Diagnosis present

## 2020-01-31 DIAGNOSIS — I9589 Other hypotension: Secondary | ICD-10-CM | POA: Diagnosis not present

## 2020-01-31 DIAGNOSIS — R197 Diarrhea, unspecified: Secondary | ICD-10-CM

## 2020-01-31 DIAGNOSIS — I959 Hypotension, unspecified: Secondary | ICD-10-CM | POA: Diagnosis present

## 2020-01-31 DIAGNOSIS — Z20822 Contact with and (suspected) exposure to covid-19: Secondary | ICD-10-CM | POA: Diagnosis present

## 2020-01-31 DIAGNOSIS — I480 Paroxysmal atrial fibrillation: Secondary | ICD-10-CM | POA: Diagnosis present

## 2020-01-31 DIAGNOSIS — A0471 Enterocolitis due to Clostridium difficile, recurrent: Secondary | ICD-10-CM | POA: Diagnosis present

## 2020-01-31 DIAGNOSIS — Z66 Do not resuscitate: Secondary | ICD-10-CM | POA: Diagnosis present

## 2020-01-31 DIAGNOSIS — R791 Abnormal coagulation profile: Secondary | ICD-10-CM | POA: Diagnosis present

## 2020-01-31 DIAGNOSIS — I482 Chronic atrial fibrillation, unspecified: Secondary | ICD-10-CM | POA: Diagnosis present

## 2020-01-31 DIAGNOSIS — D539 Nutritional anemia, unspecified: Secondary | ICD-10-CM | POA: Diagnosis present

## 2020-01-31 DIAGNOSIS — Z7901 Long term (current) use of anticoagulants: Secondary | ICD-10-CM | POA: Diagnosis not present

## 2020-01-31 DIAGNOSIS — E785 Hyperlipidemia, unspecified: Secondary | ICD-10-CM | POA: Diagnosis present

## 2020-01-31 DIAGNOSIS — I110 Hypertensive heart disease with heart failure: Secondary | ICD-10-CM | POA: Diagnosis present

## 2020-01-31 DIAGNOSIS — E876 Hypokalemia: Secondary | ICD-10-CM | POA: Diagnosis present

## 2020-01-31 DIAGNOSIS — I5042 Chronic combined systolic (congestive) and diastolic (congestive) heart failure: Secondary | ICD-10-CM | POA: Diagnosis present

## 2020-01-31 DIAGNOSIS — I2510 Atherosclerotic heart disease of native coronary artery without angina pectoris: Secondary | ICD-10-CM | POA: Diagnosis present

## 2020-01-31 DIAGNOSIS — D6832 Hemorrhagic disorder due to extrinsic circulating anticoagulants: Secondary | ICD-10-CM

## 2020-01-31 DIAGNOSIS — Z8349 Family history of other endocrine, nutritional and metabolic diseases: Secondary | ICD-10-CM | POA: Diagnosis not present

## 2020-01-31 DIAGNOSIS — Z888 Allergy status to other drugs, medicaments and biological substances status: Secondary | ICD-10-CM | POA: Diagnosis not present

## 2020-01-31 DIAGNOSIS — N400 Enlarged prostate without lower urinary tract symptoms: Secondary | ICD-10-CM | POA: Diagnosis present

## 2020-01-31 DIAGNOSIS — Z8249 Family history of ischemic heart disease and other diseases of the circulatory system: Secondary | ICD-10-CM | POA: Diagnosis not present

## 2020-01-31 DIAGNOSIS — E039 Hypothyroidism, unspecified: Secondary | ICD-10-CM | POA: Diagnosis present

## 2020-01-31 LAB — COMPREHENSIVE METABOLIC PANEL
ALT: 9 U/L (ref 0–44)
AST: 13 U/L — ABNORMAL LOW (ref 15–41)
Albumin: 2.8 g/dL — ABNORMAL LOW (ref 3.5–5.0)
Alkaline Phosphatase: 42 U/L (ref 38–126)
Anion gap: 12 (ref 5–15)
BUN: 55 mg/dL — ABNORMAL HIGH (ref 8–23)
CO2: 27 mmol/L (ref 22–32)
Calcium: 8.5 mg/dL — ABNORMAL LOW (ref 8.9–10.3)
Chloride: 102 mmol/L (ref 98–111)
Creatinine, Ser: 2.14 mg/dL — ABNORMAL HIGH (ref 0.61–1.24)
GFR calc Af Amer: 30 mL/min — ABNORMAL LOW (ref >60)
GFR calc non Af Amer: 26 mL/min — ABNORMAL LOW (ref >60)
Glucose, Bld: 112 mg/dL — ABNORMAL HIGH (ref 70–99)
Potassium: 3 mmol/L — ABNORMAL LOW (ref 3.5–5.1)
Sodium: 141 mmol/L (ref 135–145)
Total Bilirubin: 0.6 mg/dL (ref 0.3–1.2)
Total Protein: 5.3 g/dL — ABNORMAL LOW (ref 6.5–8.1)

## 2020-01-31 LAB — BASIC METABOLIC PANEL
Anion gap: 12 (ref 5–15)
BUN: 48 mg/dL — ABNORMAL HIGH (ref 8–23)
CO2: 26 mmol/L (ref 22–32)
Calcium: 7.8 mg/dL — ABNORMAL LOW (ref 8.9–10.3)
Chloride: 104 mmol/L (ref 98–111)
Creatinine, Ser: 1.91 mg/dL — ABNORMAL HIGH (ref 0.61–1.24)
GFR calc Af Amer: 34 mL/min — ABNORMAL LOW (ref >60)
GFR calc non Af Amer: 30 mL/min — ABNORMAL LOW (ref >60)
Glucose, Bld: 103 mg/dL — ABNORMAL HIGH (ref 70–99)
Potassium: 3.2 mmol/L — ABNORMAL LOW (ref 3.5–5.1)
Sodium: 142 mmol/L (ref 135–145)

## 2020-01-31 LAB — CBC WITH DIFFERENTIAL/PLATELET
Abs Immature Granulocytes: 0.14 10*3/uL — ABNORMAL HIGH (ref 0.00–0.07)
Basophils Absolute: 0.1 10*3/uL (ref 0.0–0.1)
Basophils Relative: 1 %
Eosinophils Absolute: 0.2 10*3/uL (ref 0.0–0.5)
Eosinophils Relative: 2 %
HCT: 39.6 % (ref 39.0–52.0)
Hemoglobin: 12.7 g/dL — ABNORMAL LOW (ref 13.0–17.0)
Immature Granulocytes: 1 %
Lymphocytes Relative: 10 %
Lymphs Abs: 1 10*3/uL (ref 0.7–4.0)
MCH: 32.4 pg (ref 26.0–34.0)
MCHC: 32.1 g/dL (ref 30.0–36.0)
MCV: 101 fL — ABNORMAL HIGH (ref 80.0–100.0)
Monocytes Absolute: 1.2 10*3/uL — ABNORMAL HIGH (ref 0.1–1.0)
Monocytes Relative: 12 %
Neutro Abs: 7.4 10*3/uL (ref 1.7–7.7)
Neutrophils Relative %: 74 %
Platelets: 177 10*3/uL (ref 150–400)
RBC: 3.92 MIL/uL — ABNORMAL LOW (ref 4.22–5.81)
RDW: 15.6 % — ABNORMAL HIGH (ref 11.5–15.5)
WBC: 10 10*3/uL (ref 4.0–10.5)
nRBC: 0 % (ref 0.0–0.2)

## 2020-01-31 LAB — PROTIME-INR
INR: 8.2 (ref 0.8–1.2)
INR: >10 (ref 0.8–1.2)
Prothrombin Time: 68.4 seconds — ABNORMAL HIGH (ref 11.4–15.2)
Prothrombin Time: 88.1 seconds — ABNORMAL HIGH (ref 11.4–15.2)

## 2020-01-31 LAB — RESPIRATORY PANEL BY RT PCR (FLU A&B, COVID)
Influenza A by PCR: NEGATIVE
Influenza B by PCR: NEGATIVE
SARS Coronavirus 2 by RT PCR: NEGATIVE

## 2020-01-31 LAB — C DIFFICILE QUICK SCREEN W PCR REFLEX
C Diff antigen: POSITIVE — AB
C Diff interpretation: DETECTED
C Diff toxin: POSITIVE — AB

## 2020-01-31 LAB — POC OCCULT BLOOD, ED
Fecal Occult Bld: NEGATIVE
Fecal Occult Bld: POSITIVE — AB

## 2020-01-31 LAB — MAGNESIUM: Magnesium: 1.7 mg/dL (ref 1.7–2.4)

## 2020-01-31 LAB — LACTIC ACID, PLASMA: Lactic Acid, Venous: 1.4 mmol/L (ref 0.5–1.9)

## 2020-01-31 LAB — CORTISOL-AM, BLOOD: Cortisol - AM: 54.8 ug/dL — ABNORMAL HIGH (ref 6.7–22.6)

## 2020-01-31 MED ORDER — PHYTONADIONE 5 MG PO TABS
2.5000 mg | ORAL_TABLET | Freq: Once | ORAL | Status: AC
  Administered 2020-01-31: 2.5 mg via ORAL
  Filled 2020-01-31: qty 1

## 2020-01-31 MED ORDER — POTASSIUM CHLORIDE 10 MEQ/100ML IV SOLN
10.0000 meq | INTRAVENOUS | Status: AC
  Administered 2020-01-31 (×2): 10 meq via INTRAVENOUS
  Filled 2020-01-31 (×2): qty 100

## 2020-01-31 MED ORDER — SODIUM CHLORIDE 0.9 % IV SOLN: INTRAVENOUS | Status: AC

## 2020-01-31 MED ORDER — MIDODRINE HCL 5 MG PO TABS
2.5000 mg | ORAL_TABLET | Freq: Three times a day (TID) | ORAL | Status: DC
  Administered 2020-01-31 – 2020-02-01 (×3): 2.5 mg via ORAL
  Filled 2020-01-31 (×6): qty 1

## 2020-01-31 MED ORDER — ACETAMINOPHEN 650 MG RE SUPP: 650.0000 mg | Freq: Four times a day (QID) | RECTAL | Status: DC | PRN

## 2020-01-31 MED ORDER — SODIUM CHLORIDE 0.9 % IV BOLUS
500.0000 mL | Freq: Once | INTRAVENOUS | Status: AC
  Administered 2020-01-31: 500 mL via INTRAVENOUS

## 2020-01-31 MED ORDER — MAGNESIUM SULFATE 2 GM/50ML IV SOLN
2.0000 g | Freq: Once | INTRAVENOUS | Status: AC
  Administered 2020-01-31: 2 g via INTRAVENOUS
  Filled 2020-01-31: qty 50

## 2020-01-31 MED ORDER — HYDROCORTISONE NA SUCCINATE PF 100 MG IJ SOLR
25.0000 mg | Freq: Three times a day (TID) | INTRAMUSCULAR | Status: DC
  Administered 2020-01-31 – 2020-02-01 (×2): 25 mg via INTRAVENOUS
  Filled 2020-01-31 (×2): qty 2

## 2020-01-31 MED ORDER — IOHEXOL 9 MG/ML PO SOLN
ORAL | Status: AC
  Filled 2020-01-31: qty 500

## 2020-01-31 MED ORDER — VANCOMYCIN 50 MG/ML ORAL SOLUTION
125.0000 mg | Freq: Four times a day (QID) | ORAL | Status: DC
  Administered 2020-01-31 – 2020-02-03 (×13): 125 mg via ORAL
  Filled 2020-01-31 (×14): qty 2.5

## 2020-01-31 MED ORDER — POTASSIUM CHLORIDE 10 MEQ/100ML IV SOLN
10.0000 meq | INTRAVENOUS | Status: AC
  Administered 2020-01-31 (×3): 10 meq via INTRAVENOUS
  Filled 2020-01-31 (×3): qty 100

## 2020-01-31 MED ORDER — IOHEXOL 9 MG/ML PO SOLN: 500.0000 mL | ORAL | Status: AC

## 2020-01-31 MED ORDER — ACETAMINOPHEN 325 MG PO TABS: 650.0000 mg | ORAL_TABLET | Freq: Four times a day (QID) | ORAL | Status: DC | PRN

## 2020-01-31 MED ORDER — HYDROCORTISONE NA SUCCINATE PF 100 MG IJ SOLR
50.0000 mg | Freq: Four times a day (QID) | INTRAMUSCULAR | Status: DC
  Administered 2020-01-31 (×2): 50 mg via INTRAVENOUS
  Filled 2020-01-31 (×2): qty 2

## 2020-01-31 MED ORDER — IOHEXOL 9 MG/ML PO SOLN
ORAL | Status: AC
  Administered 2020-01-31: 500 mL via ORAL
  Filled 2020-01-31: qty 500

## 2020-01-31 NOTE — Patient Outreach (Signed)
[facility]) Care Management  01/31/2020  [name] [name] 11-18-1925 [id number]   Referral Received : 01/26/20 Reason: Follow up on EMMI red alert, Transition of care follow up after DC from [facility] , SNF.  Insurance:  [employer]     Received notification patient admitted to [facility] on 01/31/20 for Acute kidney injury, Hypokalemia, Hypotension, supratherapeutic INR.  Will send notification to [facility] Liaison of patient admission.    Plan Will await [facility] Liaison recommendation for discharge follow up    Joylene Draft, RN, BSN  [place] Management Coordinator  [phone]- Mobile [phone]- [place]

## 2020-01-31 NOTE — ED Notes (Signed)
Paged Attending MD d/t pt low BP.

## 2020-01-31 NOTE — ED Notes (Signed)
DNR bracelt placed on pt Left Arm

## 2020-01-31 NOTE — ED Notes (Signed)
[name] [name], daughter, [phone] would like to speak to RN when available

## 2020-01-31 NOTE — ED Notes (Signed)
Lunch Tray Ordered @ 1038.  

## 2020-01-31 NOTE — Telephone Encounter (Signed)
Left a message for a return call.  No DPR on file for [name] [name].  Will need to speak to someone on the [facility].

## 2020-01-31 NOTE — ED Notes (Signed)
SDU 

## 2020-01-31 NOTE — Telephone Encounter (Addendum)
[name] [name] is very upset that she is not on the DPR for [facility] or [place] to speak to her about her dad. She wants Tommi Rumps to reached out to her.  Informed her that the DPr will have to be updated in order for them to release any info to her verbally. She stated that they have spoken to her in the past and will be taking this to higher up.  Pt was very rude and started cussing me because of the policy that we can only speak to the ones listed on the DPR.   Pt can be reached at [phone]

## 2020-01-31 NOTE — Progress Notes (Signed)
Pt transferred to 4E-20 via stretcher from ED. Pt transferred to bed. CHG bath given. Tele applied, CCMD notified. Pt with very dry scaly skin to bilateral lower legs. Two small stage II present on left and right buttocks, foam in place. Scabs on forehead. VSS. Will continue to monitor.  Amanda Cockayne, RN

## 2020-01-31 NOTE — ED Notes (Signed)
SDU  bfast ordered  

## 2020-01-31 NOTE — Telephone Encounter (Signed)
Per family and Epic, pt has been hospitalized.

## 2020-01-31 NOTE — Telephone Encounter (Signed)
Noted  

## 2020-01-31 NOTE — ED Notes (Signed)
Pt had episode of diarrhea. Pt cleaned and bed pad changed.

## 2020-01-31 NOTE — ED Notes (Signed)
Page sent to admitting doctor about c diff results, awaiting reply

## 2020-01-31 NOTE — Telephone Encounter (Signed)
Pt daughter Erline Hau called about her sister [name] [name] being taken off of DPR. I explained that she was on a previous DPR not the most recent which is the one we have to go by. She informed that [name] [name] is patient power of attorney and is over his healthcare. I provided pt daughter with [facility] so can cancel patient PT due to him being in the hospital. She also wanted to make [facility] aware that pt was hospitalized.

## 2020-01-31 NOTE — Progress Notes (Addendum)
PROGRESS NOTE        PATIENT DETAILS Name: [name] [name] Age: 86 y.o. Sex: male Date of Birth: Jul 15, 1925 Admit Date: 01/30/2020 Admitting Physician Shela Leff, MD [id number], Tommi Rumps, NP  Brief Narrative: Patient is a 86 y.o. male with history of chronic atrial fibrillation on anticoagulation, chronic systolic heart failure, severe AS s/p TAVR (12/15/2017)-recently hospitalized from 2/5-2/9 for C. difficile colitis-presenting back with diarrhea-highly suggestive of recurrent C. difficile colitis.  See below for further details.  Significant events: 2/5-2/9>> C. difficile colitis  Antimicrobial therapy: 3/9>> oral vancomycin  Microbiology data: 3/9>> stool C. difficile antigen and PCR positive 2/6>> stool C. difficile positive  Procedures : None  Consults: None  DVT Prophylaxis : Supratherapeutic INR  Subjective: Claims he had loose stools this morning. Claims has has had loose stools over the past few weeks.  Denies any fever.  Assessment/Plan: Recurrent C. difficile colitis: Have restarted oral vancomycin-may need a 6-week taper.  Minimize antimicrobial therapy use as much as possible in the future.  AKI: Hemodynamically mediated in the setting of diarrhea-continue gentle hydration-avoid nephrotoxic agents.  Slowly improving with supportive care.  Hypokalemia: Secondary to GI loss-being repleted-recheck electrolytes in the morning.  Hypotension: Secondary to volume loss in the setting of diarrhea.  Continue IV fluids-due to concern for adrenal insufficiency-on IV hydrocortisone-however a.m. cortisol level in the 50s.  Start tapering down Solu-Cortef.   Have started low-dose midodrine as well.  Has chronic systolic heart failure with an EF of 20-25%-may run soft/low normal BPs chronically.  Supratherapeutic INR: No evidence of overt bleeding-reported minimally blood streaked stools apparently.  Given vitamin K earlier this morning.  Repeat  INR tomorrow.  If overt bleeding develops-will require more rapid reversal.  History of rheumatoid arthritis: Continue to hold methotrexate for now  Chronic systolic heart failure (EF 20-25% by TTE on 09/19/2019): Volume status is stable-watch closely while on IV fluids.  PAF: Rate controlled-hold beta-blockers given soft blood pressure.  Supratherapeutic-Coumadin on hold.  History of AS-s/p TAVR in 2019  BPH: Hold Flomax till blood pressure improves further.  ?  HIV: Per prior notes-patient apparently has HIV-daughter not aware of this diagnosis.  Ordering HIV testing.  Follow.  Diet: Diet Order            Diet Heart Room service appropriate? Yes; Fluid consistency: Thin  Diet effective now              Code Status: DNR  Family Communication: Spoke with daughter over phone  Disposition Plan: Either SNF or home health services on discharge  Barriers to Discharge: C. difficile colitis-with ongoing diarrhea, AKI and supratherapeutic INR.  Antimicrobial agents: Anti-infectives (From admission, onward)   Start     Dose/Rate Route Frequency Ordered Stop   01/31/20 1000  vancomycin (VANCOCIN) 50 mg/mL oral solution 125 mg     125 mg Oral 4 times daily 01/31/20 0755 02/10/20 0959       Time spent: 35 minutes-Greater than 50% of this time was spent in counseling, explanation of diagnosis, planning of further management, and coordination of care.  MEDICATIONS: Scheduled Meds: . hydrocortisone sod succinate (SOLU-CORTEF) inj  50 mg Intravenous Q6H  . midodrine  2.5 mg Oral TID WC  . vancomycin  125 mg Oral QID   Continuous Infusions: . sodium chloride 100 mL/hr at 01/31/20 0850   PRN Meds:.acetaminophen **  OR** acetaminophen   PHYSICAL EXAM: Vital signs: Vitals:   01/31/20 0830 01/31/20 0845 01/31/20 0900 01/31/20 1022  BP: 116/64 105/65 106/73 (!) 86/61  Pulse: 79 86 81 66  Resp: 13 19 15 12   Temp:      TempSrc:      SpO2: 95% 94% 95% 96%  Weight:        Height:       Filed Weights   01/30/20 2337  Weight: 60.8 kg   Body mass index is 18.17 kg/m.   Gen Exam:Alert awake-not in any distress.  Chronically sick appearing. HEENT:atraumatic, normocephalic Chest: B/L clear to auscultation anteriorly CVS:S1S2 irregular Abdomen:soft non tender, non distended Extremities: Chronic-flaky-seborrheic appearing lesions in bilateral lower extremities.  Some mild erythema. Neurology: Non focal-but with generalized weakness. Skin: no rash  I have personally reviewed following labs and imaging studies  LABORATORY DATA: CBC: Recent Labs  Lab 01/31/20 0020  WBC 10.0  NEUTROABS 7.4  HGB 12.7*  HCT 39.6  MCV 101.0*  PLT [id number]    Basic Metabolic Panel: Recent Labs  Lab 01/31/20 0020 01/31/20 0530  NA 141 142  K 3.0* 3.2*  CL 102 104  CO2 27 26  GLUCOSE 112* 103*  BUN 55* 48*  CREATININE 2.14* 1.91*  CALCIUM 8.5* 7.8*  MG  --  1.7    GFR: Estimated Creatinine Clearance: 21.2 mL/min (A) (by C-G formula based on SCr of 1.91 mg/dL (H)).  Liver Function Tests: Recent Labs  Lab 01/31/20 0020  AST 13*  ALT 9  ALKPHOS 42  BILITOT 0.6  PROT 5.3*  ALBUMIN 2.8*   No results for input(s): LIPASE, AMYLASE in the last 168 hours. No results for input(s): AMMONIA in the last 168 hours.  Coagulation Profile: Recent Labs  Lab 01/31/20 0020 01/31/20 0530  INR 8.2* >10.0*    Cardiac Enzymes: No results for input(s): CKTOTAL, CKMB, CKMBINDEX, TROPONINI in the last 168 hours.  BNP (last 3 results) Recent Labs    02/07/19 1625  PROBNP 2,119*    Lipid Profile: No results for input(s): CHOL, HDL, LDLCALC, TRIG, CHOLHDL, LDLDIRECT in the last 72 hours.  Thyroid Function Tests: No results for input(s): TSH, T4TOTAL, FREET4, T3FREE, THYROIDAB in the last 72 hours.  Anemia Panel: No results for input(s): VITAMINB12, FOLATE, FERRITIN, TIBC, IRON, RETICCTPCT in the last 72 hours.  Urine analysis:    Component Value Date/Time    COLORURINE AMBER (A) 12/30/2019 2339   APPEARANCEUR HAZY (A) 12/30/2019 2339   APPEARANCEUR Cloudy (A) 06/15/2018 1234   LABSPEC 1.023 12/30/2019 2339   PHURINE 5.0 12/30/2019 2339   GLUCOSEU NEGATIVE 12/30/2019 2339   HGBUR NEGATIVE 12/30/2019 2339   BILIRUBINUR NEGATIVE 12/30/2019 2339   BILIRUBINUR Negative 06/15/2018 [place] 12/30/2019 2339   PROTEINUR 30 (A) 12/30/2019 2339   UROBILINOGEN 0.2 05/18/2015 1420   UROBILINOGEN 0.2 05/19/2014 1626   NITRITE NEGATIVE 12/30/2019 2339   LEUKOCYTESUR NEGATIVE 12/30/2019 2339    Sepsis Labs: Lactic Acid, Venous    Component Value Date/Time   LATICACIDVEN 1.4 01/30/2020 2355    MICROBIOLOGY: Recent Results (from the past 240 hour(s))  Respiratory Panel by RT PCR (Flu A&B, Covid) - Nasopharyngeal Swab     Status: None   Collection Time: 01/31/20  4:04 AM   Specimen: Nasopharyngeal Swab  Result Value Ref Range Status   SARS Coronavirus 2 by RT PCR NEGATIVE NEGATIVE Final    Comment: (NOTE) SARS-CoV-2 target nucleic acids are NOT DETECTED. The SARS-CoV-2  RNA is generally detectable in upper respiratoy specimens during the acute phase of infection. The lowest concentration of SARS-CoV-2 viral copies this assay can detect is 131 copies/mL. A negative result does not preclude SARS-Cov-2 infection and should not be used as the sole basis for treatment or other patient management decisions. A negative result may occur with  improper specimen collection/handling, submission of specimen other than nasopharyngeal swab, presence of viral mutation(s) within the areas targeted by this assay, and inadequate number of viral copies (<131 copies/mL). A negative result must be combined with clinical observations, patient history, and epidemiological information. The expected result is Negative. Fact Sheet for Patients:  PinkCheek.be Fact Sheet for Healthcare Providers:   GravelBags.it This test is not yet ap proved or cleared by the [place] FDA and  has been authorized for detection and/or diagnosis of SARS-CoV-2 by FDA under an Emergency Use Authorization (EUA). This EUA will remain  in effect (meaning this test can be used) for the duration of the COVID-19 declaration under Section 564(b)(1) of the Act, 21 U.S.C. section 360bbb-3(b)(1), unless the authorization is terminated or revoked sooner.    Influenza A by PCR NEGATIVE NEGATIVE Final   Influenza B by PCR NEGATIVE NEGATIVE Final    Comment: (NOTE) The Xpert Xpress SARS-CoV-2/FLU/RSV assay is intended as an aid in  the diagnosis of influenza from Nasopharyngeal swab specimens and  should not be used as a sole basis for treatment. Nasal washings and  aspirates are unacceptable for Xpert Xpress SARS-CoV-2/FLU/RSV  testing. Fact Sheet for Patients: PinkCheek.be Fact Sheet for Healthcare Providers: GravelBags.it This test is not yet approved or cleared by the [place] FDA and  has been authorized for detection and/or diagnosis of SARS-CoV-2 by  FDA under an Emergency Use Authorization (EUA). This EUA will remain  in effect (meaning this test can be used) for the duration of the  Covid-19 declaration under Section 564(b)(1) of the Act, 21  U.S.C. section 360bbb-3(b)(1), unless the authorization is  terminated or revoked. Performed at [facility], [place] [address] [place], [place] [address]   C difficile quick scan w PCR reflex     Status: Abnormal   Collection Time: 01/31/20  6:37 AM   Specimen: STOOL  Result Value Ref Range Status   C Diff antigen POSITIVE (A) NEGATIVE Final   C Diff toxin POSITIVE (A) NEGATIVE Final   C Diff interpretation Toxin producing C. difficile detected.  Final    Comment: CRITICAL RESULT CALLED TO, READ BACK BY AND VERIFIED WITH: Sammie Bench RN 8:50 01/31/20  ([email or web address]) Performed at [facility], [place] [address], [place], [place] [address]     RADIOLOGY STUDIES/RESULTS: CT ABDOMEN PELVIS WO CONTRAST  Result Date: 01/31/2020 CLINICAL DATA:  Persistent diarrhea. History of prior C difficile colitis. EXAM: CT ABDOMEN AND PELVIS WITHOUT CONTRAST TECHNIQUE: Multidetector CT imaging of the abdomen and pelvis was performed following the standard protocol without IV contrast. COMPARISON:  CT scan 12/31/2019 FINDINGS: Lower chest: Very small right pleural effusion and minimal dependent bibasilar atelectasis. No worrisome pulmonary lesions. Stable advanced aortic and coronary artery calcifications and evidence of prior TAVR. No pericardial effusion. Hepatobiliary: No obvious hepatic lesions or intrahepatic biliary dilatation. The gallbladder is grossly normal. No common bile duct dilatation. Pancreas: No mass, inflammation or ductal dilatation. Age related pancreatic atrophy. Moderate-sized duodenal diverticulum noted near the pancreatic head. Spleen: Normal size. No focal lesions. Adrenals/Urinary Tract: Stable small adrenal gland adenomas. No renal, ureteral or bladder calculi  or mass. Stomach/Bowel: The stomach, duodenum and small bowel are unremarkable. No acute inflammatory changes, mass lesions or obstructive findings. Mild diffuse wall thickening involving the rectum and sigmoid colon suggesting a mild inflammatory or infectious colitis. The remainder of the colon is unremarkable. Vascular/Lymphatic: Stable advanced atherosclerotic calcifications involving the aorta and iliac arteries and branch vessels. No mesenteric or retroperitoneal mass or adenopathy. Reproductive: The prostate gland and seminal vesicles are unremarkable. Other: Stable left inguinal hernia containing fat. Musculoskeletal: No significant bony findings. Stable degenerative changes involving the spine and hips. IMPRESSION: 1. Mild diffuse wall thickening involving the rectum and sigmoid colon  suggesting a mild inflammatory or infectious colitis. 2. No other significant abdominal/pelvic findings, mass lesions or adenopathy. 3. Stable advanced atherosclerotic calcifications involving the aorta and branch vessels. Aortic Atherosclerosis (ICD10-I70.0). Electronically Signed   By: Marijo Sanes M.D.   On: 01/31/2020 07:44     LOS: 0 days   Oren Binet, MD  [facility]    To contact the attending provider between 7A-7P or the covering provider during after hours 7P-7A, please log into the web site [email or web address] and access using universal  password for that web site. If you do not have the password, please call the hospital operator.  01/31/2020, 10:29 AM

## 2020-01-31 NOTE — H&P (Signed)
History and Physical    [name] [name]:[id number] DOB: 06-15-1925 DOA: 01/30/2020  PCP: [name], NP Patient coming from: Home  Chief Complaint: Diarrhea  HPI: [name] [name] is a 86 y.o. male with medical history significant of chronic atrial fibrillation on Coumadin, carotid artery occlusion, CHF with EF 20 to 25% on echo done October 2020, CAD, history of TAVR, CVA/TIA, hypertension, hyperlipidemia, HIV, hypothyroidism, rheumatoid arthritis presenting to the ED with complaints of ongoing diarrhea.  He was admitted a month ago for C. difficile colitis and treated with a 14-day course of vancomycin.  Patient states since his hospital discharge he has continued to have ongoing diarrhea.  Also experiencing intermittent periumbilical abdominal discomfort.  No nausea or vomiting.  No fevers or chills.  Does report taking Lasix at home.  He is not sure which dose of Coumadin he takes as his daughter and aide help him with medications.  States he received his first Covid vaccine 3 days ago.  ED Course: Hypotensive with systolic as low as 85Y.  Afebrile.  Not tachycardic.  Labs showing no leukocytosis.  Lactic acid normal.  Hemoglobin 12.7, stable compared to labs done a month ago.  Potassium 3.0.  BUN 55, creatinine 2.1.  Creatinine was 1.1 a month ago.  INR supratherapeutic at 8.2.  FOBT positive and noted to have blood-streaked diarrhea in the ED.  Patient received 1 L normal saline bolus and potassium supplementation.  Review of Systems:  All systems reviewed and apart from history of presenting illness, are negative.  Past Medical History:  Diagnosis Date  . Atrial fibrillation, chronic (HCC)    a. on coumadin   . Carotid artery occlusion    right total internal artery occlusion  . Chronic diastolic CHF (congestive heart failure) ([place])   . Coronary artery disease    s/p cardiac cath in 1990s, and cardiolite study  in 2005 showing EF 54%  . Degenerative disc disease    cervical  spine  . GERD (gastroesophageal reflux disease)   . HIV infection ([place])   . Hyperlipidemia   . Hypertension   . Hypothyroidism   . Rheumatoid arthritis ([place])   . S/P TAVR (transcatheter aortic valve replacement)    Edwards Sapien 3 THV (size 29 mm) via the R TF approach   . Skin cancer    forehead  . Stroke [facility])    TIA   . TIA (transient ischemic attack)   . Urgency incontinence     Past Surgical History:  Procedure Laterality Date  . AMPUTATION Right 02/21/2015   Procedure: Right 3rd Ray Amputation;  Surgeon: Newt Minion, MD;  Location: [place];  Service: Orthopedics;  Laterality: Right;  Digital block and ankle block with MAC  . CARDIAC CATHETERIZATION  1990s  . CATARACT EXTRACTION W/ INTRAOCULAR LENS  IMPLANT, BILATERAL    . EYE SURGERY     cataracts bilateral  . INTRAOPERATIVE TRANSTHORACIC ECHOCARDIOGRAM  09/14/2018   Procedure: INTRAOPERATIVE TRANSTHORACIC ECHOCARDIOGRAM;  Surgeon: Sherren Mocha, MD;  Location: [place];  Service: Open Heart Surgery;;  . RIGHT/LEFT HEART CATH AND CORONARY ANGIOGRAPHY N/A 07/21/2018   Procedure: RIGHT/LEFT HEART CATH AND CORONARY ANGIOGRAPHY;  Surgeon: Sherren Mocha, MD;  Location: [place] CV LAB;  Service: Cardiovascular;  Laterality: N/A;  . SKIN CANCER EXCISION  12/2011   forehead  . TRANSCATHETER AORTIC VALVE REPLACEMENT, TRANSFEMORAL N/A 09/14/2018   Procedure: TRANSCATHETER AORTIC VALVE REPLACEMENT, TRANSFEMORAL. 74m EDWARDS SAPIEN3 THV.;  Surgeon: CSherren Mocha MD;  [facility];  Service: Open Heart Surgery;  Laterality: N/A;     reports that he quit smoking about 30 years ago. His smoking use included cigarettes. He has a 40.00 pack-year smoking history. He has never used smokeless tobacco. He reports that he does not drink alcohol or use drugs.  Allergies  Allergen Reactions  . Levofloxacin Rash    "thought they were sticking swords thru me"  . Klor-Con [Potassium Chloride Er] Diarrhea    Family History  Problem  Relation Age of Onset  . Heart disease Mother   . Malignant hyperthermia Mother   . Hypertension Mother   . Heart disease Father   . Malignant hyperthermia Father   . Hyperlipidemia Father   . Hypertension Father   . Heart disease Sister   . Hypertension Sister     Prior to Admission medications   Medication Sig Start Date End Date Taking? Authorizing Provider  acetaminophen (TYLENOL) 325 MG tablet Take 2 tablets (650 mg total) by mouth every 6 (six) hours as needed for mild pain (or Fever >/= 101). 01/03/20   Hoyt Koch, MD  atorvastatin (LIPITOR) 10 MG tablet TAKE 1 TABLET AT BEDTIME 01/11/20   Dorothy Spark, MD  Calcium-Vitamin D (CALTRATE 600 PLUS-VIT D PO) Take 1 tablet by mouth at bedtime.     [provider]  folic acid (FOLVITE) 1 MG tablet Take 1 mg by mouth daily.  03/06/16   [provider]  furosemide (LASIX) 40 MG tablet Take 80 mg by mouth daily.    [provider]  guaiFENesin (MUCINEX) 600 MG 12 hr tablet Take 600-1,200 mg by mouth 2 (two) times daily as needed (cold/congestion.).    [provider]  methotrexate (RHEUMATREX) 2.5 MG tablet Take 10 mg by mouth once a week. Caution:Chemotherapy. Protect from light. On Saturdays    [provider]  Multiple Vitamin (MULTIVITAMIN WITH MINERALS) TABS tablet Take 1 tablet by mouth daily.    [provider]  nitroGLYCERIN (NITROSTAT) 0.4 MG SL tablet DISSOLVE 1 TABLET UNDER THE TONGUE EVERY 5 MINUTES AS NEEDED FOR CHEST PAIN Patient not taking: No sig reported 06/23/19   Dorothy Spark, MD  potassium chloride SA (KLOR-CON) 20 MEQ tablet Take 2 tablets (40 mEq total) by mouth every morning. Patient taking differently: Take 20 mEq by mouth every morning.  12/20/19   Dorothy Spark, MD  Probiotic Product (HEALTHY COLON PO) Take 1 capsule by mouth daily.    [provider]  tamsulosin (FLOMAX) 0.4 MG CAPS capsule Take 1 capsule (0.4 mg total) by mouth  every evening. 05/11/19   Dorothy Spark, MD  warfarin (COUMADIN) 5 MG tablet Take 1 tablet daily except take 1 1/2 tablets Mon Wed and Fridays or Take as directed by anticoagulation clinic 12/08/19   [name], NP    Physical Exam: Vitals:   01/31/20 0338 01/31/20 0345 01/31/20 0400 01/31/20 0415  BP:  (!) 93/47 (!) 90/51 (!) 95/52  Pulse: (!) 53  62 80  Resp: _0 Temp:      TempSrc:      SpO2: 96%  98% 95%  Weight:      Height:        Physical Exam  Constitutional: He is oriented to person, place, and time. No distress.  Frail elderly male  HENT:  Head: Normocephalic.  Very dry mucous membranes  Eyes: Right eye exhibits no discharge. Left eye exhibits no discharge.  Cardiovascular: Normal  rate, regular rhythm and intact distal pulses.  Pulmonary/Chest: Effort normal and breath sounds normal. No respiratory distress. He has no wheezes. He has no rales.  Abdominal: Soft. Bowel sounds are normal. He exhibits no distension. There is abdominal tenderness. There is guarding. There is no rebound.  Mild generalized tenderness to palpation with guarding  Musculoskeletal:        General: Edema present.     Cervical back: Neck supple.     Comments: +1 pitting edema bilateral lower extremities  Neurological: He is alert and oriented to person, place, and time.  Skin: Skin is warm and dry. He is not diaphoretic.  Chronic venous stasis dermatitis of bilateral lower extremities     Labs on Admission: I have personally reviewed following labs and imaging studies  CBC: Recent Labs  Lab 01/31/20 0020  WBC 10.0  NEUTROABS 7.4  HGB 12.7*  HCT 39.6  MCV 101.0*  PLT 970   Basic Metabolic Panel: Recent Labs  Lab 01/31/20 0020  NA 141  K 3.0*  CL 102  CO2 27  GLUCOSE 112*  BUN 55*  CREATININE 2.14*  CALCIUM 8.5*   GFR: Estimated Creatinine Clearance: 18.9 mL/min (A) (by C-G formula based on SCr of 2.14 mg/dL (H)). Liver Function Tests: Recent Labs  Lab  01/31/20 0020  AST 13*  ALT 9  ALKPHOS 42  BILITOT 0.6  PROT 5.3*  ALBUMIN 2.8*   No results for input(s): LIPASE, AMYLASE in the last 168 hours. No results for input(s): AMMONIA in the last 168 hours. Coagulation Profile: Recent Labs  Lab 01/31/20 0020  INR 8.2*   Cardiac Enzymes: No results for input(s): CKTOTAL, CKMB, CKMBINDEX, TROPONINI in the last 168 hours. BNP (last 3 results) Recent Labs    02/07/19 1625  PROBNP 2,119*   HbA1C: No results for input(s): HGBA1C in the last 72 hours. CBG: No results for input(s): GLUCAP in the last 168 hours. Lipid Profile: No results for input(s): CHOL, HDL, LDLCALC, TRIG, CHOLHDL, LDLDIRECT in the last 72 hours. Thyroid Function Tests: No results for input(s): TSH, T4TOTAL, FREET4, T3FREE, THYROIDAB in the last 72 hours. Anemia Panel: No results for input(s): VITAMINB12, FOLATE, FERRITIN, TIBC, IRON, RETICCTPCT in the last 72 hours. Urine analysis:    Component Value Date/Time   COLORURINE AMBER (A) 12/30/2019 2339   APPEARANCEUR HAZY (A) 12/30/2019 2339   APPEARANCEUR Cloudy (A) 06/15/2018 1234   LABSPEC 1.023 12/30/2019 2339   PHURINE 5.0 12/30/2019 2339   GLUCOSEU NEGATIVE 12/30/2019 2339   HGBUR NEGATIVE 12/30/2019 2339   BILIRUBINUR NEGATIVE 12/30/2019 2339   BILIRUBINUR Negative 06/15/2018 [place] 12/30/2019 2339   PROTEINUR 30 (A) 12/30/2019 2339   UROBILINOGEN 0.2 05/18/2015 1420   UROBILINOGEN 0.2 05/19/2014 1626   NITRITE NEGATIVE 12/30/2019 2339   LEUKOCYTESUR NEGATIVE 12/30/2019 2339    Radiological Exams on Admission: No results found.  EKG: Personally reviewed.  Atrial fibrillation, heart rate 87.  PVCs.  Assessment/Plan Principal Problem:   Diarrhea Active Problems:   Atrial fibrillation (HCC)   Hypokalemia   AKI (acute kidney injury) ([place])   Hypotension   Diarrhea: Patient was admitted a month ago for C. difficile colitis and treated with a full 14-day course of vancomycin.   He has had ongoing diarrhea since his hospital discharge.  No fever, leukocytosis, or signs of sepsis.  FOBT positive and noted to have blood-streaked diarrhea in the ED.  He is on Coumadin for A. fib and INR currently supratherapeutic.  Plan is to hold Coumadin.  Give oral vitamin K for INR reversal.  CT abdomen pelvis without contrast.  Order C. difficile PCR and GI pathogen panel.  SARS-CoV-2 PCR test pending, continue airborne and contact precautions.  Hypotension: Suspect related to severe dehydration from ongoing diarrhea and diuretic use.  No fever or leukocytosis to suggest underlying infection.  No signs of sepsis.  Lactic acid normal. ?Adrenal insufficiency: Not seen in past medical history or problem list section but FYI section on the patient's chart has adrenal insufficiency listed.  Will start empiric stress dose steroids given hypotension.  Check cortisol level.  Systolic has now improved 90s after receiving 1.5 L fluid boluses.  Admit to progressive care unit and monitor blood pressure closely.  Continue cautious fluid repletion given history of CHF with very low EF.  AKI: Suspect due to severe dehydration from ongoing diarrhea and diuretic use.  Continue IV fluid hydration.  Repeat BMP in a.m.  Avoid nephrotoxic agents/contrast.  Hold diuretics.  Strict intake and output.  Hypokalemia: Suspect due due to diuretic use and GI loss from ongoing diarrhea.  Replete potassium.  Check magnesium level and replete if low.  Continue to monitor electrolytes.  Chronic atrial fibrillation on Coumadin with supratherapeutic INR: Currently rate controlled. Hold metoprolol in the setting of hypotension.  INR supratherapeutic at 8.2.  Patient is having blood-streaked diarrhea.  Plan is to hold Coumadin, give oral vitamin K 2.5 mg once, and repeat PT/INR in a.m. Continue cardiac monitoring.    Chronic systolic CHF: EF 20 to 26% on echo done October 2020.  No signs of volume overload.  Hold diuretic in the  setting of hypotension.  Pharmacy med rec pending.  DVT prophylaxis: SCDs Code Status: Patient wishes to be DNR/DNI. Family Communication: No family available at this time. Disposition Plan: Anticipate discharge after resolution of diarrhea, hypotension, and AKI.  Severely dehydrated and needs IV fluid hydration. Will require a stay of greater than 2 midnights in hospital. Admission status: It is my clinical opinion that admission to INPATIENT is reasonable and necessary because of the expectation that this patient will require hospital care that crosses at least 2 midnights to treat this condition based on the medical complexity of the problems presented.  Given the aforementioned information, the predictability of an adverse outcome is felt to be significant.  The medical decision making on this patient was of high complexity and the patient is at high risk for clinical deterioration, therefore this is a level 3 visit.  Shela Leff MD [facility]  If 7PM-7AM, please contact night-coverage [email or web address]  01/31/2020, 4:38 AM

## 2020-02-01 ENCOUNTER — Ambulatory Visit: Payer: Medicare Other | Admitting: Hematology

## 2020-02-01 ENCOUNTER — Ambulatory Visit: Payer: Medicare HMO

## 2020-02-01 LAB — COMPREHENSIVE METABOLIC PANEL
ALT: 6 U/L (ref 0–44)
AST: 12 U/L — ABNORMAL LOW (ref 15–41)
Albumin: 2.2 g/dL — ABNORMAL LOW (ref 3.5–5.0)
Alkaline Phosphatase: 39 U/L (ref 38–126)
Anion gap: 9 (ref 5–15)
BUN: 33 mg/dL — ABNORMAL HIGH (ref 8–23)
CO2: 22 mmol/L (ref 22–32)
Calcium: 7.8 mg/dL — ABNORMAL LOW (ref 8.9–10.3)
Chloride: 111 mmol/L (ref 98–111)
Creatinine, Ser: 1.36 mg/dL — ABNORMAL HIGH (ref 0.61–1.24)
GFR calc Af Amer: 52 mL/min — ABNORMAL LOW (ref >60)
GFR calc non Af Amer: 45 mL/min — ABNORMAL LOW (ref >60)
Glucose, Bld: 114 mg/dL — ABNORMAL HIGH (ref 70–99)
Potassium: 3.8 mmol/L (ref 3.5–5.1)
Sodium: 142 mmol/L (ref 135–145)
Total Bilirubin: 0.9 mg/dL (ref 0.3–1.2)
Total Protein: 4.6 g/dL — ABNORMAL LOW (ref 6.5–8.1)

## 2020-02-01 LAB — PROTIME-INR
INR: 3.3 — ABNORMAL HIGH (ref 0.8–1.2)
Prothrombin Time: 33.2 seconds — ABNORMAL HIGH (ref 11.4–15.2)

## 2020-02-01 LAB — CBC
HCT: 35.6 % — ABNORMAL LOW (ref 39.0–52.0)
Hemoglobin: 11.6 g/dL — ABNORMAL LOW (ref 13.0–17.0)
MCH: 32 pg (ref 26.0–34.0)
MCHC: 32.6 g/dL (ref 30.0–36.0)
MCV: 98.1 fL (ref 80.0–100.0)
Platelets: 166 10*3/uL (ref 150–400)
RBC: 3.63 MIL/uL — ABNORMAL LOW (ref 4.22–5.81)
RDW: 15.4 % (ref 11.5–15.5)
WBC: 7.3 10*3/uL (ref 4.0–10.5)
nRBC: 0 % (ref 0.0–0.2)

## 2020-02-01 LAB — HIV ANTIBODY (ROUTINE TESTING W REFLEX): HIV Screen 4th Generation wRfx: NONREACTIVE

## 2020-02-01 MED ORDER — ATORVASTATIN CALCIUM 10 MG PO TABS
10.0000 mg | ORAL_TABLET | Freq: Every day | ORAL | Status: DC
  Administered 2020-02-01 – 2020-02-02 (×2): 10 mg via ORAL
  Filled 2020-02-01 (×2): qty 1

## 2020-02-01 MED ORDER — FOLIC ACID 1 MG PO TABS
1.0000 mg | ORAL_TABLET | Freq: Every day | ORAL | Status: DC
  Administered 2020-02-01 – 2020-02-03 (×3): 1 mg via ORAL
  Filled 2020-02-01 (×3): qty 1

## 2020-02-01 MED ORDER — NITROGLYCERIN 0.4 MG SL SUBL: 0.4000 mg | SUBLINGUAL_TABLET | SUBLINGUAL | Status: DC | PRN

## 2020-02-01 MED ORDER — TAMSULOSIN HCL 0.4 MG PO CAPS
0.4000 mg | ORAL_CAPSULE | Freq: Every evening | ORAL | Status: DC
  Administered 2020-02-01 – 2020-02-02 (×2): 0.4 mg via ORAL
  Filled 2020-02-01 (×2): qty 1

## 2020-02-01 MED ORDER — MIDODRINE HCL 5 MG PO TABS
2.5000 mg | ORAL_TABLET | Freq: Two times a day (BID) | ORAL | Status: DC
  Administered 2020-02-01: 2.5 mg via ORAL
  Filled 2020-02-01: qty 1

## 2020-02-01 NOTE — Progress Notes (Signed)
PHYSICAL THERAPY EVALUATION  HISTORY OF CURRENT ILLNESS: 86 y/o male w/ hx of TIA/CVA, skin cancer, s/p TAVR, RA, hypothyroid, HTN, HIV infection, GERD, DDD, chronic diastolic CHF EF 0000000 ([id number]), carotid occlusion, chronic a-fib, R 3rd ray amputation 2016. Presented to ED w/ c/o ongoing diarrhea, was admitted approx 1 mos ago w/ c-diff. first COVID vaccine 01/28/20. Pt from [facility] recently but states was d/c home. Received notification patient admitted to [facility] on 01/31/20 for Acute kidney injury, Hypokalemia, Hypotension, supratherapeutic INR.  CLINICAL IMPRESSION: PTA was living home alone but had assist of family 1 once a week to shop and cook for pt, 6 days a week someone came in heated meals and helped with some household chores. Pt states was independent at AD level, but had recently been hospitalized with c-diff d/c to SNF and returned home alone. Pt states when he has accident and is incontinent takes up to 2 hours to clean himself up and by the end he is exhausted and not able to do much else. This am pt moving slowly needing increased cues and assist with mobility for safety, he was able to ambulate with RW and mod a x 7ft, other mobility also with mi-mod a. Pt presented with decreased overall strength, balance and coordination, activity tolerance, independence and overall safety with mobility. Pt is in agreement that he will need increased assistance at d/c and is agreeable to looking into SNF placement.  D/C RECOMMENDATION: will need 24hr assist, Benefit from SNF and continued rehabilitation.     02/01/20 1000  PT Visit Information  Last PT Received On 02/01/20  Assistance Needed +1  History of Present Illness 86 y/o male w/ hx of TIA, CVA, skin cancer, s/p TAVR, RA, hypothyroid, HTN, HIV infection, GERD, DDD, chronic diastolic CHF EF 0000000 ([id number]), carotid occlusion, chronic a-fib, R 3rd ray amputation 2016. Presented to ED w/ c/o ongoing diarrhea, wa admitted  approx 1 mos ago w/ c-diff. first COVID vaccine 01/28/20. Pt from [facility] but was d/c home. Received notification patient admitted to [facility] on 01/31/20 for Acute kidney injury, Hypokalemia, Hypotension, supratherapeutic INR.   Precautions  Precautions Fall  Precaution Comments enteric  Restrictions  Weight Bearing Restrictions No  Home Living  Family/patient expects to be discharged to: Private residence  Living Arrangements Alone  Available Help at Discharge Family;Friend(s);Available PRN/intermittently  Type of [place] One level  Bathroom Shower/Tub Walk-in Cytogeneticist Yes  [place] - 4 wheels;Walker - 2 wheels  Additional Comments states is alone most of time, family comes in once a week to shop and cook  and some aide comes in 6 days to warm up his food and help with cleaning etc. He states when has incontinence of bowel takes hours to clean himself up and by time he is done is he is very fatigued and unable to do much else  Prior Function  Level of Independence Needs assistance  Gait / Transfers Assistance Needed mod I using rollator. Pt reports multi falls. Sleep in a lift chair.  ADL's / Homemaking Assistance Needed Daughters and neighbor share responsibility of grocery shopping, meal prep, housekeeping and med management. Pt reports he does his dressing and bathing, but could really use help. Those tasks are hard on his own and he is very fearful of falling. Pt reports he wears Depends for incontinence.  Communication / Swallowing Assistance Needed no  difficulties noted  Communication  Communication No difficulties  Pain Assessment  Pain Assessment No/denies pain  Cognition  Arousal/Alertness Lethargic  Behavior During Therapy WFL for tasks assessed/performed  Overall Cognitive Status No family/caregiver present to determine baseline cognitive functioning   Upper Extremity Assessment  Upper Extremity Assessment Generalized weakness  Lower Extremity Assessment  Lower Extremity Assessment Generalized weakness  Cervical / Trunk Assessment  Cervical / Trunk Assessment Kyphotic  Bed Mobility  Overal bed mobility Needs Assistance  Bed Mobility Supine to Sit;Sit to Supine  Supine to sit Mod assist  Sit to supine Min assist  General bed mobility comments uses bed fixtures, needs assist witth lines and set up  Transfers  Overall transfer level Needs assistance  Equipment used Rolling walker (2 wheeled)  Transfers Sit to/from [employer] Transfers  Sit to Stand Mod assist  Stand pivot transfers Min assist  General transfer comment needs intermittent cues for safety with walker  Ambulation/Gait  Ambulation/Gait assistance Mod assist  Gait Distance (Feet) 38 Feet  Assistive device Rolling walker (2 wheeled)  Gait Pattern/deviations Step-through pattern;Shuffle;Wide base of support  General Gait Details small shuffled steps in room , needs cues for obstacle negotiation and on turns  Gait velocity decreased  Balance  Overall balance assessment Needs assistance;History of Falls  Sitting-balance support Feet supported  Sitting balance-Leahy Scale Good  Sitting balance - Comments able to donn slippers sitting edge of bed with set up  Standing balance support Bilateral upper extremity supported;During functional activity  Standing balance-Leahy Scale Poor  Standing balance comment dependent on UE support  General Comments  General comments (skin integrity, edema, etc.) on room air and sats WFL throughout  PT - End of Session  Equipment Utilized During Treatment Gait belt  Activity Tolerance Patient limited by fatigue;Patient limited by lethargy  Patient left in bed;with call bell/phone within reach;with bed alarm set  Nurse Communication Mobility status  PT Assessment  PT Recommendation/Assessment Patient needs continued PT services  PT  Visit Diagnosis Other abnormalities of gait and mobility (R26.89);Unsteadiness on feet (R26.81);Muscle weakness (generalized) (M62.81);History of falling (Z91.81)  PT Problem List Decreased strength;Decreased activity tolerance;Decreased balance;Decreased mobility;Decreased coordination;Decreased knowledge of use of DME;Decreased safety awareness  Barriers to Discharge Other (comment)  Barriers to Discharge Comments lives home alone, progressive debility and hx of falls  PT Plan  PT Frequency (ACUTE ONLY) Min 2X/week  PT Treatment/Interventions (ACUTE ONLY) DME instruction;Gait training;Functional mobility training;Therapeutic activities;Therapeutic exercise;Balance training;Neuromuscular re-education;Patient/family education  AM-PAC PT "6 Clicks" Mobility Outcome Measure (Version 2)  Help needed turning from your back to your side while in a flat bed without using bedrails? 3  Help needed moving from lying on your back to sitting on the side of a flat bed without using bedrails? 2  Help needed moving to and from a bed to a chair (including a wheelchair)? 2  Help needed standing up from a chair using your arms (e.g., wheelchair or bedside chair)? 2  Help needed to walk in hospital room? 2  Help needed climbing 3-5 steps with a railing?  1  6 Click Score 12  Consider Recommendation of Discharge To: CIR/SNF/LTACH  PT Recommendation  Follow Up Recommendations SNF  PT equipment None recommended by PT  Individuals Consulted  Consulted and Agree with Results and Recommendations Patient  Acute Rehab PT Goals  Patient Stated Goal agreeable to placement at dc  PT Goal Formulation With patient  Time For Goal Achievement 02/15/20  Potential to [place]  PT  Time Calculation  PT Start Time (ACUTE ONLY) 1011  PT Stop Time (ACUTE ONLY) 1036  PT Time Calculation (min) (ACUTE ONLY) 25 min  PT General Charges  $$ ACUTE PT VISIT 1 Visit  PT Evaluation  $PT Eval Moderate Complexity 1 Mod  PT  Treatments  $Therapeutic Activity 8-22 mins  Written Expression  Dominant Hand Right    Horald Chestnut, PT

## 2020-02-01 NOTE — Progress Notes (Signed)
ANTICOAGULATION CONSULT NOTE - Initial Consult  Pharmacy Consult for Coumadin Indication: atrial fibrillation  Allergies  Allergen Reactions  . Levofloxacin Rash    "thought they were sticking swords thru me"  . Klor-Con [Potassium Chloride Er] Diarrhea    Patient Measurements: Height: 6' (182.9 cm) Weight: 134 lb (60.8 kg) IBW/kg (Calculated) : 77.6  Vital Signs: Temp: 97.5 F (36.4 C) (03/10 0556) Temp Source: Oral (03/10 0556) BP: 110/71 (03/10 0556) Pulse Rate: 55 (03/10 0556)  Labs: Recent Labs    01/31/20 0020 01/31/20 0530 02/01/20 0500  HGB 12.7*  --  11.6*  HCT 39.6  --  35.6*  PLT 177  --  166  LABPROT 68.4* 88.1* 33.2*  INR 8.2* >10.0* 3.3*  CREATININE 2.14* 1.91* 1.36*    Estimated Creatinine Clearance: 29.8 mL/min (A) (by C-G formula based on SCr of 1.36 mg/dL (H)).   Medical History: Past Medical History:  Diagnosis Date  . Atrial fibrillation, chronic (HCC)    a. on coumadin   . Carotid artery occlusion    right total internal artery occlusion  . Chronic diastolic CHF (congestive heart failure) ([place])   . Coronary artery disease    s/p cardiac cath in 1990s, and cardiolite study  in 2005 showing EF 54%  . Degenerative disc disease    cervical spine  . GERD (gastroesophageal reflux disease)   . HIV infection ([place])   . Hyperlipidemia   . Hypertension   . Hypothyroidism   . Rheumatoid arthritis ([place])   . S/P TAVR (transcatheter aortic valve replacement)    Edwards Sapien 3 THV (size 29 mm) via the R TF approach   . Skin cancer    forehead  . Stroke [facility])    TIA   . TIA (transient ischemic attack)   . Urgency incontinence     Medications:  Medications Prior to Admission  Medication Sig Dispense Refill Last Dose  . acetaminophen (TYLENOL) 325 MG tablet Take 2 tablets (650 mg total) by mouth every 6 (six) hours as needed for mild pain (or Fever >/= 101).   unk  . atorvastatin (LIPITOR) 10 MG tablet TAKE 1 TABLET AT BEDTIME 90 tablet 3  01/28/2020  . Calcium-Vitamin D (CALTRATE 600 PLUS-VIT D PO) Take 1 tablet by mouth at bedtime.    [id number]  . folic acid (FOLVITE) 1 MG tablet Take 1 mg by mouth daily.    01/28/2020  . guaiFENesin (MUCINEX) 600 MG 12 hr tablet Take 600-1,200 mg by mouth 2 (two) times daily as needed (cold/congestion.).   unk  . methotrexate (RHEUMATREX) 2.5 MG tablet Take 10 mg by mouth once a week. Caution:Chemotherapy. Protect from light. On Saturdays   01/28/2020  . metoprolol succinate (TOPROL-XL) 25 MG 24 hr tablet Take 12.5 mg by mouth every evening.   01/29/2020 at 1830  . Multiple Vitamin (MULTIVITAMIN WITH MINERALS) TABS tablet Take 1 tablet by mouth daily.   Past Week at Unknown time  . nitroGLYCERIN (NITROSTAT) 0.4 MG SL tablet DISSOLVE 1 TABLET UNDER THE TONGUE EVERY 5 MINUTES AS NEEDED FOR CHEST PAIN (Patient taking differently: Place 0.4 mg under the tongue every 5 (five) minutes as needed for chest pain. ) 25 tablet 4 unk  . potassium chloride SA (KLOR-CON) 20 MEQ tablet Take 2 tablets (40 mEq total) by mouth every morning. (Patient taking differently: Take 20 mEq by mouth every morning. ) 180 tablet 3 01/28/2020  . Probiotic Product (HEALTHY COLON PO) Take 1 capsule by mouth daily.  Past Week at Unknown time  . tamsulosin (FLOMAX) 0.4 MG CAPS capsule Take 1 capsule (0.4 mg total) by mouth every evening. 90 capsule 3 01/28/2020  . warfarin (COUMADIN) 5 MG tablet Take 1 tablet daily except take 1 1/2 tablets Mon Wed and Fridays or Take as directed by anticoagulation clinic (Patient taking differently: Take 5-7.5 mg by mouth See admin instructions. Take 1 tablet daily except take 1 1/2 tablets Mon Wed and Fridays or Take as directed by anticoagulation clinic) 120 tablet 1 01/30/2020 at 1830   Scheduled:  . hydrocortisone sod succinate (SOLU-CORTEF) inj  25 mg Intravenous Q8H  . midodrine  2.5 mg Oral TID WC  . vancomycin  125 mg Oral QID    Assessment: 86yo male admitted for ongoing diarrhea after full C.diff  treatment with vancomycin, to resume Coumadin for Afib; current INR above goal though improved after vit K (8.2>10>3.3), last dose of Coumadin taken 3/8.  Goal of Therapy:  INR 2-3   Plan:  Will hold Coumadin another day, monitor INR, and consider resuming tomorrow.  Wynona Neat, PharmD, BCPS  02/01/2020,7:31 AM

## 2020-02-01 NOTE — Progress Notes (Addendum)
PROGRESS NOTE        PATIENT DETAILS Name: [name] [name] Age: 86 y.o. Sex: male Date of Birth: [birth year] Admit Date: 01/30/2020 Admitting Physician Shela Leff, MD [id number], Tommi Rumps, NP  Brief Narrative: Patient is a 86 y.o. male with history of chronic atrial fibrillation on anticoagulation, chronic systolic heart failure, severe AS s/p TAVR (12/15/2017)-recently hospitalized from 2/5-2/9 for C. difficile colitis-presenting back with diarrhea-highly suggestive of recurrent C. difficile colitis.  See below for further details.  Significant events: 2/5-2/9>> C. difficile colitis  Antimicrobial therapy: 3/9>> oral vancomycin  Microbiology data: 3/9>> stool C. difficile antigen and PCR positive 2/6>> stool C. difficile positive  Procedures : None  Consults: None  DVT Prophylaxis : Coumadin  Subjective: Feels better-but patient and nursing staff-diarrhea has significantly improved.  Assessment/Plan: Recurrent C. difficile colitis: Diarrhea improved-continue oral vancomycin-likely will need a 6-week taper.  Need to minimize antibiotic use in the future.  AKI: Hemodynamically mediated in the setting of diarrhea-improved but not yet back to baseline.  Stop all IV fluids-saline lock.    Hypokalemia: Secondary to GI loss-repleted-continue to follow.  Hypotension: Secondary to volume loss in the setting of diarrhea.  Resolved-stop IV hydrocortisone-a.m. cortisol level in the 50s-do not think patient has adrenal insufficiency.  Begin titrating down midodrine-changed to twice daily dosing today.  Not on IV fluids..  Supratherapeutic INR: No evidence of overt bleeding-reported minimally blood streaked stools apparently on admission.  Given vitamin K yesterday-INR still therapeutic but markedly better.  Resume Coumadin per pharmacy.    History of rheumatoid arthritis: Continue to hold methotrexate for now  Chronic systolic heart failure (EF 20-25%  by TTE on 09/19/2019): Volume status is stable-follow.  PAF: Rate controlled-will hold beta-blockers for 1 more day-if blood pressure stable-we will resume tomorrow.  Resume Coumadin per pharmacy.    History of AS-s/p TAVR in 2019  BPH: Resume Flomax.  ?  HIV: Per prior notes-patient apparently has HIV-daughter not aware of this diagnosis.  HIV antibody pending-follow  Diet: Diet Order            Diet Heart Room service appropriate? Yes; Fluid consistency: Thin  Diet effective now              Code Status: DNR  Family Communication: Spoke with daughter over phone 3/10  Disposition Plan: Either SNF or home health services on discharge  Barriers to Discharge: C. difficile colitis-with ongoing diarrhea, AKI and supratherapeutic INR.  Antimicrobial agents: Anti-infectives (From admission, onward)   Start     Dose/Rate Route Frequency Ordered Stop   01/31/20 1000  vancomycin (VANCOCIN) 50 mg/mL oral solution 125 mg     125 mg Oral 4 times daily 01/31/20 0755 02/10/20 0959       Time spent: 25 minutes-Greater than 50% of this time was spent in counseling, explanation of diagnosis, planning of further management, and coordination of care.  MEDICATIONS: Scheduled Meds: . atorvastatin  10 mg Oral QHS  . folic acid  1 mg Oral Daily  . midodrine  2.5 mg Oral BID WC  . tamsulosin  0.4 mg Oral QPM  . vancomycin  125 mg Oral QID   Continuous Infusions:  PRN Meds:.acetaminophen **OR** acetaminophen, nitroGLYCERIN   PHYSICAL EXAM: Vital signs: Vitals:   02/01/20 0100 02/01/20 0556 02/01/20 0804 02/01/20 1159  BP: 108/66 110/71 114/77 123/78  Pulse: 72 (!)  55 62   Resp: 17 (!) 24 16 17   Temp: 97.7 F (36.5 C) (!) 97.5 F (36.4 C) 97.6 F (36.4 C) 97.6 F (36.4 C)  TempSrc: Oral Oral Oral Oral  SpO2: 94% 97% 97% 97%  Weight:      Height:       Filed Weights   01/30/20 2337  Weight: 60.8 kg   Body mass index is 18.17 kg/m.   Gen Exam:Alert awake-not in any  distress HEENT:atraumatic, normocephalic Chest: B/L clear to auscultation anteriorly CVS:S1S2 regular Abdomen:soft non tender, non distended Extremities:no edema-has chronic lesions in his bilateral foot that are stable. Neurology: Non focal-but with generalized weakness Skin: no rash  I have personally reviewed following labs and imaging studies  LABORATORY DATA: CBC: Recent Labs  Lab 01/31/20 0020 02/01/20 0500  WBC 10.0 7.3  NEUTROABS 7.4  --   HGB 12.7* 11.6*  HCT 39.6 35.6*  MCV 101.0* 98.1  PLT 177 [id number]    Basic Metabolic Panel: Recent Labs  Lab 01/31/20 0020 01/31/20 0530 02/01/20 0500  NA 141 142 142  K 3.0* 3.2* 3.8  CL 102 104 111  CO2 27 26 22   GLUCOSE 112* 103* 114*  BUN 55* 48* 33*  CREATININE 2.14* 1.91* 1.36*  CALCIUM 8.5* 7.8* 7.8*  MG  --  1.7  --     GFR: Estimated Creatinine Clearance: 29.8 mL/min (A) (by C-G formula based on SCr of 1.36 mg/dL (H)).  Liver Function Tests: Recent Labs  Lab 01/31/20 0020 02/01/20 0500  AST 13* 12*  ALT 9 6  ALKPHOS 42 39  BILITOT 0.6 0.9  PROT 5.3* 4.6*  ALBUMIN 2.8* 2.2*   No results for input(s): LIPASE, AMYLASE in the last 168 hours. No results for input(s): AMMONIA in the last 168 hours.  Coagulation Profile: Recent Labs  Lab 01/31/20 0020 01/31/20 0530 02/01/20 0500  INR 8.2* >10.0* 3.3*    Cardiac Enzymes: No results for input(s): CKTOTAL, CKMB, CKMBINDEX, TROPONINI in the last 168 hours.  BNP (last 3 results) Recent Labs    02/07/19 1625  PROBNP 2,119*    Lipid Profile: No results for input(s): CHOL, HDL, LDLCALC, TRIG, CHOLHDL, LDLDIRECT in the last 72 hours.  Thyroid Function Tests: No results for input(s): TSH, T4TOTAL, FREET4, T3FREE, THYROIDAB in the last 72 hours.  Anemia Panel: No results for input(s): VITAMINB12, FOLATE, FERRITIN, TIBC, IRON, RETICCTPCT in the last 72 hours.  Urine analysis:    Component Value Date/Time   COLORURINE AMBER (A) 12/30/2019 2339    APPEARANCEUR HAZY (A) 12/30/2019 2339   APPEARANCEUR Cloudy (A) 06/15/2018 1234   LABSPEC 1.023 12/30/2019 2339   PHURINE 5.0 12/30/2019 2339   GLUCOSEU NEGATIVE 12/30/2019 2339   HGBUR NEGATIVE 12/30/2019 2339   BILIRUBINUR NEGATIVE 12/30/2019 2339   BILIRUBINUR Negative 06/15/2018 [place] 12/30/2019 2339   PROTEINUR 30 (A) 12/30/2019 2339   UROBILINOGEN 0.2 05/18/2015 1420   UROBILINOGEN 0.2 05/19/2014 1626   NITRITE NEGATIVE 12/30/2019 2339   LEUKOCYTESUR NEGATIVE 12/30/2019 2339    Sepsis Labs: Lactic Acid, Venous    Component Value Date/Time   LATICACIDVEN 1.4 01/30/2020 2355    MICROBIOLOGY: Recent Results (from the past 240 hour(s))  Respiratory Panel by RT PCR (Flu A&B, Covid) - Nasopharyngeal Swab     Status: None   Collection Time: 01/31/20  4:04 AM   Specimen: Nasopharyngeal Swab  Result Value Ref Range Status   SARS Coronavirus 2 by RT PCR NEGATIVE NEGATIVE Final  Comment: (NOTE) SARS-CoV-2 target nucleic acids are NOT DETECTED. The SARS-CoV-2 RNA is generally detectable in upper respiratoy specimens during the acute phase of infection. The lowest concentration of SARS-CoV-2 viral copies this assay can detect is 131 copies/mL. A negative result does not preclude SARS-Cov-2 infection and should not be used as the sole basis for treatment or other patient management decisions. A negative result may occur with  improper specimen collection/handling, submission of specimen other than nasopharyngeal swab, presence of viral mutation(s) within the areas targeted by this assay, and inadequate number of viral copies (<131 copies/mL). A negative result must be combined with clinical observations, patient history, and epidemiological information. The expected result is Negative. Fact Sheet for Patients:  PinkCheek.be Fact Sheet for Healthcare Providers:  GravelBags.it This test is not yet ap  proved or cleared by the [place] FDA and  has been authorized for detection and/or diagnosis of SARS-CoV-2 by FDA under an Emergency Use Authorization (EUA). This EUA will remain  in effect (meaning this test can be used) for the duration of the COVID-19 declaration under Section 564(b)(1) of the Act, 21 U.S.C. section 360bbb-3(b)(1), unless the authorization is terminated or revoked sooner.    Influenza A by PCR NEGATIVE NEGATIVE Final   Influenza B by PCR NEGATIVE NEGATIVE Final    Comment: (NOTE) The Xpert Xpress SARS-CoV-2/FLU/RSV assay is intended as an aid in  the diagnosis of influenza from Nasopharyngeal swab specimens and  should not be used as a sole basis for treatment. Nasal washings and  aspirates are unacceptable for Xpert Xpress SARS-CoV-2/FLU/RSV  testing. Fact Sheet for Patients: PinkCheek.be Fact Sheet for Healthcare Providers: GravelBags.it This test is not yet approved or cleared by the [place] FDA and  has been authorized for detection and/or diagnosis of SARS-CoV-2 by  FDA under an Emergency Use Authorization (EUA). This EUA will remain  in effect (meaning this test can be used) for the duration of the  Covid-19 declaration under Section 564(b)(1) of the Act, 21  U.S.C. section 360bbb-3(b)(1), unless the authorization is  terminated or revoked. Performed at [facility], [place] [address] [place], [place] [address]   C difficile quick scan w PCR reflex     Status: Abnormal   Collection Time: 01/31/20  6:37 AM   Specimen: STOOL  Result Value Ref Range Status   C Diff antigen POSITIVE (A) NEGATIVE Final   C Diff toxin POSITIVE (A) NEGATIVE Final   C Diff interpretation Toxin producing C. difficile detected.  Final    Comment: CRITICAL RESULT CALLED TO, READ BACK BY AND VERIFIED WITH: Sammie Bench RN 8:50 01/31/20 ([email or web address]) Performed at [facility], [place] [address]  [place], [place] [address]     RADIOLOGY STUDIES/RESULTS: CT ABDOMEN PELVIS WO CONTRAST  Result Date: 01/31/2020 CLINICAL DATA:  Persistent diarrhea. History of prior C difficile colitis. EXAM: CT ABDOMEN AND PELVIS WITHOUT CONTRAST TECHNIQUE: Multidetector CT imaging of the abdomen and pelvis was performed following the standard protocol without IV contrast. COMPARISON:  CT scan 12/31/2019 FINDINGS: Lower chest: Very small right pleural effusion and minimal dependent bibasilar atelectasis. No worrisome pulmonary lesions. Stable advanced aortic and coronary artery calcifications and evidence of prior TAVR. No pericardial effusion. Hepatobiliary: No obvious hepatic lesions or intrahepatic biliary dilatation. The gallbladder is grossly normal. No common bile duct dilatation. Pancreas: No mass, inflammation or ductal dilatation. Age related pancreatic atrophy. Moderate-sized duodenal diverticulum noted near the pancreatic head. Spleen: Normal size. No focal lesions. Adrenals/Urinary Tract:  Stable small adrenal gland adenomas. No renal, ureteral or bladder calculi or mass. Stomach/Bowel: The stomach, duodenum and small bowel are unremarkable. No acute inflammatory changes, mass lesions or obstructive findings. Mild diffuse wall thickening involving the rectum and sigmoid colon suggesting a mild inflammatory or infectious colitis. The remainder of the colon is unremarkable. Vascular/Lymphatic: Stable advanced atherosclerotic calcifications involving the aorta and iliac arteries and branch vessels. No mesenteric or retroperitoneal mass or adenopathy. Reproductive: The prostate gland and seminal vesicles are unremarkable. Other: Stable left inguinal hernia containing fat. Musculoskeletal: No significant bony findings. Stable degenerative changes involving the spine and hips. IMPRESSION: 1. Mild diffuse wall thickening involving the rectum and sigmoid colon suggesting a mild inflammatory or infectious colitis. 2. No other  significant abdominal/pelvic findings, mass lesions or adenopathy. 3. Stable advanced atherosclerotic calcifications involving the aorta and branch vessels. Aortic Atherosclerosis (ICD10-I70.0). Electronically Signed   By: Marijo Sanes M.D.   On: 01/31/2020 07:44     LOS: 1 day   Oren Binet, MD  [facility]    To contact the attending provider between 7A-7P or the covering provider during after hours 7P-7A, please log into the web site [email or web address] and access using universal [place] password for that web site. If you do not have the password, please call the hospital operator.  02/01/2020, 1:38 PM

## 2020-02-01 NOTE — Consult Note (Signed)
   [facility] Inpatient Consult   02/01/2020  [name] [name] 04/09/1925 [id number]   [facility] Active status: pending  Patient was an [place] Discharge red alert call and now evaluated for community based chronic complex disease management services with [place] [facility] as a benefit of patient's [facility] Medicare PPO Insurance. Patient was in the ED at the time.  Today, chart review reveals patient is at extreme high risk for unplanned readmission score of 33%.  Patient has less than a 30 day re-hospitalizations. Chart review also reveals from MD progress of history and physical 01/31/20 notes which includes but not limited to   [name] [name] is a 86 y.o. male with medical history significant of chronic atrial fibrillation on Coumadin, carotid artery occlusion, CHF with EF 20 to 25% on echo done October 2020, CAD, history of TAVR, CVA/TIA, hypertension, hyperlipidemia, HIV, hypothyroidism, rheumatoid arthritis presenting to the ED with complaints of ongoing diarrhea.  He was admitted a month ago for C. difficile colitis and treated with a 14-day course of vancomycin.  Patient states since his hospital discharge he has continued to have ongoing diarrhea.  Plan:  Continue to follow for progress  and  evaluate for complex disease management needs.      Of note, [facility] Care Management services does not replace or interfere with any services that are arranged by inpatient case management or social work.  For additional questions or referrals please contact:    Natividad Brood, RN BSN [facility] Liaison  [phone] business mobile phone Toll free office [phone]  Fax number: [phone] [email or web address]  [email or web address]

## 2020-02-01 NOTE — Evaluation (Signed)
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 Presented to ED w/ c/o ongoing diarrhea, wa admitted approx 1 mos ago w/ c-diff. first COVID vaccine 01/28/20. Pt from [facility]. Received notification patient admitted to [facility] on 01/31/20 for Acute kidney injury, Hypokalemia, Hypotension, supratherapeutic INR.    Clinical Impression   Pt PTA: pt reports that he lives alone, but has neighbor/daughter who assists with IADLs. Pt performing own ADL. Pt using rollator for mobility. Pt currently minA overall for ADL and minguardA for mobility. Pt stood at sink for extensive grooming; minA for pericare as pt's condom catheter fell off 2x. Pt able to donn slippers, but minA for fitting heels in. Pt stating "I use the carpet to put them on." Pt would benefit from continued OT skilled services for ADL, mobility and safety. OT following acutely.   ** pt could go home if he had more supervision and physical assist for lower body ADL tasks.    Follow Up Recommendations  SNF(pt could progress to [place], but may benefit from 24/7)    Equipment Recommendations  Other (comment)(to be determined)    Recommendations for Other Services       Precautions / Restrictions Precautions Precautions: Fall;Other (comment) Precaution Comments: enteric Restrictions Weight Bearing Restrictions: No      Mobility Bed Mobility Overal bed mobility: Needs Assistance Bed Mobility: Supine to Sit     Supine to sit: Supervision Sit to supine: Min assist   General bed mobility comments: use of rail and cues to scoot to EOB  Transfers Overall transfer level: Needs assistance Equipment used:  Rolling walker (2 wheeled) Transfers: Sit to/from Stand Sit to Stand: Mod assist Stand pivot transfers: Min assist       General transfer comment: needs intermittent cues for safety with walker    Balance Overall balance assessment: Needs assistance;History of Falls Sitting-balance support: Feet supported Sitting balance-Leahy Scale: Good Sitting balance - Comments: able to donn slippers sitting edge of bed with set up   Standing balance support: Bilateral upper extremity supported;During functional activity Standing balance-Leahy Scale: Poor Standing balance comment: use of RW                           ADL either performed or assessed with clinical judgement   ADL Overall ADL's : Needs assistance/impaired Eating/Feeding: Set up;Sitting   Grooming: Min guard;Standing   Upper Body Bathing: Set up;Sitting   Lower Body Bathing: Min guard;Minimal assistance;Sitting/lateral leans;Cueing for safety   Upper Body Dressing : Set up;Sitting   Lower Body Dressing: Min guard;Cueing for safety;Sitting/lateral leans   Toilet Transfer: Min guard;Ambulation;Grab bars;RW   Toileting- Clothing Manipulation and Hygiene: Minimal assistance;Cueing for safety;Sitting/lateral lean;Sit to/from stand       Functional mobility during ADLs: Min guard;Rolling walker General ADL Comments: Pt stood at sink for extensive grooming; minA for pericare as pt's condom catheter fell off 2x. Pt able to donn slippers, but minA for fitting heels in. Pt stating "I use the carpet to put them on."     Vision Baseline Vision/History: Wears glasses Wears Glasses: At all times Patient Visual Report: No change from baseline Vision Assessment?: No apparent visual  deficits     Perception     Praxis      Pertinent Vitals/Pain Pain Assessment: No/denies pain     Hand Dominance Right   Extremity/Trunk Assessment Upper Extremity Assessment Upper Extremity Assessment: Generalized weakness    Lower Extremity Assessment Lower Extremity Assessment: Generalized weakness   Cervical / Trunk Assessment Cervical / Trunk Assessment: Kyphotic   Communication Communication Communication: No difficulties   Cognition Arousal/Alertness: Lethargic Behavior During Therapy: WFL for tasks assessed/performed Overall Cognitive Status: No family/caregiver present to determine baseline cognitive functioning                                     General Comments  VSS on RA    Exercises     Shoulder Instructions      Home Living Family/patient expects to be discharged to:: Private residence Living Arrangements: Alone Available Help at Discharge: Family;Friend(s);Available PRN/intermittently Type of Home: House Home Access: Ramped entrance     Home Layout: One level     Bathroom Shower/Tub: Occupational psychologist: Standard Bathroom Accessibility: Yes   Home Equipment: Environmental consultant - 4 wheels;Walker - 2 wheels;Bedside commode;Shower seat   Additional Comments: states is alone most of time, family comes in once a week to shop and cook  and some aide comes in 6 days to warm up his food and help with cleaning etc. He states when has incontinence of bowel takes hours to clean himself up and is very fatigued afterwards.      Prior Functioning/Environment Level of Independence: Needs assistance  Gait / Transfers Assistance Needed: mod I using rollator. Pt reports multi falls. Sleep in a lift chair. ADL's / Homemaking Assistance Needed: Daughters and neighbor share responsibility of grocery shopping, meal prep, housekeeping and med management. Pt reports he does his dressing and bathing, but could really use help. Those tasks are hard on his own and he is very fearful of falling. Pt reports he wears Depends for incontinence. Communication / Swallowing Assistance Needed: no difficulties noted          OT Problem List: Decreased activity tolerance;Decreased  strength;Impaired balance (sitting and/or standing);Decreased safety awareness;Pain      OT Treatment/Interventions: Self-care/ADL training;Therapeutic exercise;Energy conservation;DME and/or AE instruction;Therapeutic activities;Patient/family education;Balance training    OT Goals(Current goals can be found in the care plan section) Acute Rehab OT Goals Patient Stated Goal: agreeable to placement at dc OT Goal Formulation: With patient Time For Goal Achievement: 02/15/20 Potential to Achieve Goals: Good ADL Goals Pt Will Perform Lower Body Dressing: with min guard assist;sitting/lateral leans;sit to/from stand Pt Will Transfer to Toilet: with supervision;ambulating Pt Will Perform Toileting - Clothing Manipulation and hygiene: with min guard assist;sit to/from stand Pt/caregiver will Perform Home Exercise Program: Increased strength;Both right and left upper extremity;With written HEP provided Additional ADL Goal #1: pt will increase to supervisionA for OOB ADL with good safety awareness.  OT Frequency: Min 2X/week   Barriers to D/C:            Co-evaluation              AM-PAC OT "6 Clicks" Daily Activity     Outcome Measure Help from another person eating meals?: None Help from another person taking care of personal grooming?: A Little Help from another person toileting, which includes using toliet, bedpan, or urinal?: A Little Help from another person bathing (including washing, rinsing, drying)?:  A Little Help from another person to put on and taking off regular upper body clothing?: None Help from another person to put on and taking off regular lower body clothing?: A Little 6 Click Score: 20   End of Session Equipment Utilized During Treatment: Gait belt;Rolling walker Nurse Communication: Mobility status  Activity Tolerance: Patient tolerated treatment well Patient left: in chair;with call bell/phone within reach;with chair alarm set  OT Visit Diagnosis:  Unsteadiness on feet (R26.81);Muscle weakness (generalized) (M62.81)                Time: [id number] OT Time Calculation (min): 47 min Charges:  OT General Charges $OT Visit: 1 Visit OT Evaluation $OT Eval Moderate Complexity: 1 Mod OT Treatments $Self Care/Home Management : 8-22 mins $Therapeutic Activity: 8-22 mins  Jefferey Pica, OTR/L Acute Rehabilitation Services Pager: [phone] Office: [phone]   Fed Ceci C 02/01/2020, 1:42 PM

## 2020-02-02 ENCOUNTER — Other Ambulatory Visit: Payer: Self-pay

## 2020-02-02 ENCOUNTER — Ambulatory Visit: Payer: Self-pay | Admitting: *Deleted

## 2020-02-02 LAB — BASIC METABOLIC PANEL
Anion gap: 7 (ref 5–15)
BUN: 25 mg/dL — ABNORMAL HIGH (ref 8–23)
CO2: 24 mmol/L (ref 22–32)
Calcium: 7.9 mg/dL — ABNORMAL LOW (ref 8.9–10.3)
Chloride: 110 mmol/L (ref 98–111)
Creatinine, Ser: 1.17 mg/dL (ref 0.61–1.24)
GFR calc Af Amer: >60 mL/min (ref >60)
GFR calc non Af Amer: 54 mL/min — ABNORMAL LOW (ref >60)
Glucose, Bld: 85 mg/dL (ref 70–99)
Potassium: 3.4 mmol/L — ABNORMAL LOW (ref 3.5–5.1)
Sodium: 141 mmol/L (ref 135–145)

## 2020-02-02 LAB — CBC
HCT: 37.7 % — ABNORMAL LOW (ref 39.0–52.0)
Hemoglobin: 12.2 g/dL — ABNORMAL LOW (ref 13.0–17.0)
MCH: 31.4 pg (ref 26.0–34.0)
MCHC: 32.4 g/dL (ref 30.0–36.0)
MCV: 96.9 fL (ref 80.0–100.0)
Platelets: 170 10*3/uL (ref 150–400)
RBC: 3.89 MIL/uL — ABNORMAL LOW (ref 4.22–5.81)
RDW: 15.4 % (ref 11.5–15.5)
WBC: 7.9 10*3/uL (ref 4.0–10.5)
nRBC: 0 % (ref 0.0–0.2)

## 2020-02-02 LAB — PROTIME-INR
INR: 2.6 — ABNORMAL HIGH (ref 0.8–1.2)
Prothrombin Time: 27.9 seconds — ABNORMAL HIGH (ref 11.4–15.2)

## 2020-02-02 LAB — SARS CORONAVIRUS 2 (TAT 6-24 HRS): SARS Coronavirus 2: NEGATIVE

## 2020-02-02 MED ORDER — WARFARIN - PHARMACIST DOSING INPATIENT: Freq: Every day | Status: DC

## 2020-02-02 MED ORDER — WARFARIN SODIUM 5 MG PO TABS
5.0000 mg | ORAL_TABLET | Freq: Once | ORAL | Status: AC
  Administered 2020-02-02: 5 mg via ORAL
  Filled 2020-02-02: qty 1

## 2020-02-02 MED ORDER — POTASSIUM CHLORIDE CRYS ER 20 MEQ PO TBCR
40.0000 meq | EXTENDED_RELEASE_TABLET | Freq: Once | ORAL | Status: AC
  Administered 2020-02-02: 40 meq via ORAL
  Filled 2020-02-02: qty 2

## 2020-02-02 NOTE — Plan of Care (Signed)
  Problem: Clinical Measurements: Goal: Cardiovascular complication will be avoided Outcome: Not Progressing   Problem: Elimination: Goal: Will not experience complications related to bowel motility Outcome: Not Progressing   Problem: Skin Integrity: Goal: Risk for impaired skin integrity will decrease Outcome: Not Progressing

## 2020-02-02 NOTE — Progress Notes (Signed)
Spoke with and updated pt daughter [name] on pt health status and plan of care. All questions answered.  Arletta Bale, RN

## 2020-02-02 NOTE — Progress Notes (Signed)
Pt requesting change in code status. MD notified. Will continue to monitor.  Arletta Bale, RN

## 2020-02-02 NOTE — Progress Notes (Signed)
PROGRESS NOTE        PATIENT DETAILS Name: [name] [name] Age: 86 y.o. Sex: male Date of Birth: 23-Jan-1925 Admit Date: 01/30/2020 Admitting Physician Shela Leff, MD [id number], Tommi Rumps, NP  Brief Narrative: Patient is a 86 y.o. male with history of chronic atrial fibrillation on anticoagulation, chronic systolic heart failure, severe AS s/p TAVR (12/15/2017)-recently hospitalized from 2/5-2/9 for C. difficile colitis-presenting back with diarrhea-highly suggestive of recurrent C. difficile colitis.  See below for further details.  Significant events: 2/5-2/9>> C. difficile colitis  Antimicrobial therapy: 3/9>> oral vancomycin  Microbiology data: 3/9>> stool C. difficile antigen and PCR positive 2/6>> stool C. difficile positive  Procedures : None  Consults: None  DVT Prophylaxis : Coumadin  Subjective: Lying comfortably in bed-diarrhea continues to improve.  Assessment/Plan: Recurrent C. difficile colitis: Diarrhea improved-continue oral vancomycin-likely will need a 6-week taper.  Need to minimize antibiotic use in the future.  AKI: Hemodynamically mediated in the setting of diarrhea-improved with supportive care and improvement in diarrhea.  Hypokalemia: Secondary to GI loss-replete and recheck  Hypotension: Resolved.  Secondary to volume loss in the setting of diarrhea.  Briefly treated with midodrine-admitting MD placed on empiric IV hydrocortisone for concern for adrenal insufficiency-however a.m. cortisol was in the 50s.  No longer on midodrine/steroids.  Supratherapeutic INR: No evidence of overt bleeding-reported minimally blood streaked stools apparently on admission.  Given vitamin K on admission-INR now therapeutic-okay to resume Coumadin per pharmacy .    History of rheumatoid arthritis: Continue to hold methotrexate for now  Chronic systolic heart failure (EF 20-25% by TTE on 09/19/2019): Volume status is stable-follow.   PAF: Rate controlled-Coumadin per pharmacy.  Resume beta-blocker if blood pressure remains stable off midodrine in the next day or so.   History of AS-s/p TAVR in 2019  BPH: Resume Flomax.  ?  HIV: Per prior notes-patient apparently has HIV-daughter not aware of this diagnosis.  HIV antibody negative.  No suggestion that patient has HIV at this point.   Diet: Diet Order            Diet Heart Room service appropriate? Yes; Fluid consistency: Thin  Diet effective now              Code Status: DNR  Family Communication: Spoke with daughter over phone 3/11  Disposition Plan: SNF on discharge  Barriers to Discharge: C. difficile colitis-with ongoing diarrhea, AKI and supratherapeutic INR.  Antimicrobial agents: Anti-infectives (From admission, onward)   Start     Dose/Rate Route Frequency Ordered Stop   01/31/20 1000  vancomycin (VANCOCIN) 50 mg/mL oral solution 125 mg     125 mg Oral 4 times daily 01/31/20 0755 02/10/20 0959       Time spent: 25 minutes-Greater than 50% of this time was spent in counseling, explanation of diagnosis, planning of further management, and coordination of care.  MEDICATIONS: Scheduled Meds: . atorvastatin  10 mg Oral QHS  . folic acid  1 mg Oral Daily  . tamsulosin  0.4 mg Oral QPM  . vancomycin  125 mg Oral QID  . warfarin  5 mg Oral ONCE-1800  . Warfarin - Pharmacist Dosing Inpatient   Does not apply q1800   Continuous Infusions:  PRN Meds:.acetaminophen **OR** acetaminophen, nitroGLYCERIN   PHYSICAL EXAM: Vital signs: Vitals:   02/02/20 0031 02/02/20 0436 02/02/20 0745 02/02/20 1248  BP:  107/63 (!) 100/53 115/76 107/65  Pulse: 74 70  74  Resp: 15 14 17 18   Temp: 97.7 F (36.5 C) 97.7 F (36.5 C) (!) 97.5 F (36.4 C) 98.7 F (37.1 C)  TempSrc: Oral Oral Oral Oral  SpO2: 96% 96% 95% 95%  Weight:      Height:       Filed Weights   01/30/20 2337  Weight: 60.8 kg   Body mass index is 18.17 kg/m.   Gen Exam:Alert  awake-not in any distress HEENT:atraumatic, normocephalic Chest: B/L clear to auscultation anteriorly CVS:S1S2 regular Abdomen:soft non tender, non distended Extremities:no edema Neurology: Non focal Skin: no rash  I have personally reviewed following labs and imaging studies  LABORATORY DATA: CBC: Recent Labs  Lab 01/31/20 0020 02/01/20 0500 02/02/20 0733  WBC 10.0 7.3 7.9  NEUTROABS 7.4  --   --   HGB 12.7* 11.6* 12.2*  HCT 39.6 35.6* 37.7*  MCV 101.0* 98.1 96.9  PLT 177 166 [id number]    Basic Metabolic Panel: Recent Labs  Lab 01/31/20 0020 01/31/20 0530 02/01/20 0500 02/02/20 0733  NA 141 142 142 141  K 3.0* 3.2* 3.8 3.4*  CL 102 104 111 110  CO2 27 26 22 24   GLUCOSE 112* 103* 114* 85  BUN 55* 48* 33* 25*  CREATININE 2.14* 1.91* 1.36* 1.17  CALCIUM 8.5* 7.8* 7.8* 7.9*  MG  --  1.7  --   --     GFR: Estimated Creatinine Clearance: 34.6 mL/min (by C-G formula based on SCr of 1.17 mg/dL).  Liver Function Tests: Recent Labs  Lab 01/31/20 0020 02/01/20 0500  AST 13* 12*  ALT 9 6  ALKPHOS 42 39  BILITOT 0.6 0.9  PROT 5.3* 4.6*  ALBUMIN 2.8* 2.2*   No results for input(s): LIPASE, AMYLASE in the last 168 hours. No results for input(s): AMMONIA in the last 168 hours.  Coagulation Profile: Recent Labs  Lab 01/31/20 0020 01/31/20 0530 02/01/20 0500 02/02/20 0239  INR 8.2* >10.0* 3.3* 2.6*    Cardiac Enzymes: No results for input(s): CKTOTAL, CKMB, CKMBINDEX, TROPONINI in the last 168 hours.  BNP (last 3 results) Recent Labs    02/07/19 1625  PROBNP 2,119*    Lipid Profile: No results for input(s): CHOL, HDL, LDLCALC, TRIG, CHOLHDL, LDLDIRECT in the last 72 hours.  Thyroid Function Tests: No results for input(s): TSH, T4TOTAL, FREET4, T3FREE, THYROIDAB in the last 72 hours.  Anemia Panel: No results for input(s): VITAMINB12, FOLATE, FERRITIN, TIBC, IRON, RETICCTPCT in the last 72 hours.  Urine analysis:    Component Value Date/Time    COLORURINE AMBER (A) 12/30/2019 2339   APPEARANCEUR HAZY (A) 12/30/2019 2339   APPEARANCEUR Cloudy (A) 06/15/2018 1234   LABSPEC 1.023 12/30/2019 2339   PHURINE 5.0 12/30/2019 2339   GLUCOSEU NEGATIVE 12/30/2019 2339   HGBUR NEGATIVE 12/30/2019 2339   BILIRUBINUR NEGATIVE 12/30/2019 2339   BILIRUBINUR Negative 06/15/2018 [place] 12/30/2019 2339   PROTEINUR 30 (A) 12/30/2019 2339   UROBILINOGEN 0.2 05/18/2015 1420   UROBILINOGEN 0.2 05/19/2014 1626   NITRITE NEGATIVE 12/30/2019 2339   LEUKOCYTESUR NEGATIVE 12/30/2019 2339    Sepsis Labs: Lactic Acid, Venous    Component Value Date/Time   LATICACIDVEN 1.4 01/30/2020 2355    MICROBIOLOGY: Recent Results (from the past 240 hour(s))  Respiratory Panel by RT PCR (Flu A&B, Covid) - Nasopharyngeal Swab     Status: None   Collection Time: 01/31/20  4:04 AM   Specimen: Nasopharyngeal  Swab  Result Value Ref Range Status   SARS Coronavirus 2 by RT PCR NEGATIVE NEGATIVE Final    Comment: (NOTE) SARS-CoV-2 target nucleic acids are NOT DETECTED. The SARS-CoV-2 RNA is generally detectable in upper respiratoy specimens during the acute phase of infection. The lowest concentration of SARS-CoV-2 viral copies this assay can detect is 131 copies/mL. A negative result does not preclude SARS-Cov-2 infection and should not be used as the sole basis for treatment or other patient management decisions. A negative result may occur with  improper specimen collection/handling, submission of specimen other than nasopharyngeal swab, presence of viral mutation(s) within the areas targeted by this assay, and inadequate number of viral copies (<131 copies/mL). A negative result must be combined with clinical observations, patient history, and epidemiological information. The expected result is Negative. Fact Sheet for Patients:  PinkCheek.be Fact Sheet for Healthcare Providers:   GravelBags.it This test is not yet ap proved or cleared by the [place] FDA and  has been authorized for detection and/or diagnosis of SARS-CoV-2 by FDA under an Emergency Use Authorization (EUA). This EUA will remain  in effect (meaning this test can be used) for the duration of the COVID-19 declaration under Section 564(b)(1) of the Act, 21 U.S.C. section 360bbb-3(b)(1), unless the authorization is terminated or revoked sooner.    Influenza A by PCR NEGATIVE NEGATIVE Final   Influenza B by PCR NEGATIVE NEGATIVE Final    Comment: (NOTE) The Xpert Xpress SARS-CoV-2/FLU/RSV assay is intended as an aid in  the diagnosis of influenza from Nasopharyngeal swab specimens and  should not be used as a sole basis for treatment. Nasal washings and  aspirates are unacceptable for Xpert Xpress SARS-CoV-2/FLU/RSV  testing. Fact Sheet for Patients: PinkCheek.be Fact Sheet for Healthcare Providers: GravelBags.it This test is not yet approved or cleared by the [place] FDA and  has been authorized for detection and/or diagnosis of SARS-CoV-2 by  FDA under an Emergency Use Authorization (EUA). This EUA will remain  in effect (meaning this test can be used) for the duration of the  Covid-19 declaration under Section 564(b)(1) of the Act, 21  U.S.C. section 360bbb-3(b)(1), unless the authorization is  terminated or revoked. Performed at [facility], [place] [address] [place], [place] [address]   C difficile quick scan w PCR reflex     Status: Abnormal   Collection Time: 01/31/20  6:37 AM   Specimen: STOOL  Result Value Ref Range Status   C Diff antigen POSITIVE (A) NEGATIVE Final   C Diff toxin POSITIVE (A) NEGATIVE Final   C Diff interpretation Toxin producing C. difficile detected.  Final    Comment: CRITICAL RESULT CALLED TO, READ BACK BY AND VERIFIED WITH: Sammie Bench RN 8:50 01/31/20  ([email or web address]) Performed at [facility], [place] [address] [place], [place] [address]     RADIOLOGY STUDIES/RESULTS: No results found.   LOS: 2 days   Oren Binet, MD  [facility]    To contact the attending provider between 7A-7P or the covering provider during after hours 7P-7A, please log into the web site [email or web address] and access using universal [place] password for that web site. If you do not have the password, please call the hospital operator.  02/02/2020, 2:09 PM

## 2020-02-02 NOTE — NC FL2 (Signed)
[place] LEVEL OF CARE SCREENING TOOL     IDENTIFICATION  Patient Name: [name] [name] Birthdate: [birth year] Sex: male Admission Date (Current Location): 01/30/2020  [facility] and [place] Number:  Herbalist and Address:  [facility] [place], [place] [address], [place], [place] [address]      Provider Number: [id number]  Attending Physician Name and Address:  Jonetta Osgood, MD  Relative Name and Phone Number:       Current Level of Care: Hospital Recommended Level of Care: [place] Prior Approval Number:    Date Approved/Denied:   PASRR Number: [id number]  Discharge Plan: SNF    Current Diagnoses: Patient Active Problem List   Diagnosis Date Noted  . Hypotension 01/31/2020  . Chronic systolic CHF (congestive heart failure) ([place]) 12/31/2019  . Malignant melanoma of skin of back ([place]) 12/31/2019  . Rheumatoid arthritis ([place]) 12/31/2019  . AKI (acute kidney injury) ([place]) 12/31/2019  . Swelling 07/29/2019  . S/P TAVR (transcatheter aortic valve replacement)   . Urinary frequency 06/10/2018  . Lower respiratory infection 12/18/2017  . Iron deficiency anemia 12/16/2016  . Severe aortic stenosis 04/10/2015  . Chronic anticoagulation 02/17/2015  . Bilateral carotid artery disease ([place]) 09/12/2014  . Acute on chronic diastolic heart failure ([place]) 07/10/2014  . Hypothyroidism 07/04/2013  . Occlusion and stenosis of carotid artery without mention of cerebral infarction 05/18/2012  . Diarrhea 02/07/2012  . COPD with asthma ([place]) 01/26/2012  . Sepsis associated hypotension ([place]) 11/27/2011  . Hypokalemia 11/26/2011  . Long term (current) use of anticoagulants 02/24/2011  . Atrial fibrillation ([place]) 01/12/2009  . HLD (hyperlipidemia) 06/15/2007  . Benign essential HTN 06/15/2007    Orientation RESPIRATION BLADDER Height & Weight     Self, Time, Situation, Place  Normal Incontinent, External catheter Weight: 134  lb (60.8 kg) Height:  6' (182.9 cm)  BEHAVIORAL SYMPTOMS/MOOD NEUROLOGICAL BOWEL NUTRITION STATUS      Incontinent Diet(see discharge summary)  AMBULATORY STATUS COMMUNICATION OF NEEDS Skin   Extensive Assist Verbally Normal(dry/flaky)                       Personal Care Assistance Level of Assistance  Bathing, Feeding, Dressing Bathing Assistance: Maximum assistance Feeding assistance: Independent Dressing Assistance: Maximum assistance     Functional Limitations Info  Sight, Hearing, Speech Sight Info: Adequate Hearing Info: Adequate Speech Info: Adequate    SPECIAL CARE FACTORS FREQUENCY  PT (By licensed PT), OT (By licensed OT)     PT Frequency: 5x week OT Frequency: 5x week            Contractures Contractures Info: Not present    Additional Factors Info  Code Status, Allergies, Isolation Precautions Code Status Info: Full Code Allergies Info: Levofloxacin, Klor-con (Potassium Chloride Er)     Isolation Precautions Info: Enteric     Current Medications (02/02/2020):  This is the current hospital active medication list Current Facility-Administered Medications  Medication Dose Route Frequency Provider Last Rate Last Admin  . acetaminophen (TYLENOL) tablet 650 mg  650 mg Oral Q6H PRN Shela Leff, MD       Or  . acetaminophen (TYLENOL) suppository 650 mg  650 mg Rectal Q6H PRN Shela Leff, MD      . atorvastatin (LIPITOR) tablet 10 mg  10 mg Oral QHS Jonetta Osgood, MD   10 mg at 02/01/20 2124  . folic acid (FOLVITE) tablet 1 mg  1 mg Oral  Daily Jonetta Osgood, MD   1 mg at 02/02/20 0954  . nitroGLYCERIN (NITROSTAT) SL tablet 0.4 mg  0.4 mg Sublingual Q5 min PRN Jonetta Osgood, MD      . tamsulosin (FLOMAX) capsule 0.4 mg  0.4 mg Oral QPM Jonetta Osgood, MD   0.4 mg at 02/01/20 1700  . vancomycin (VANCOCIN) 50 mg/mL oral solution 125 mg  125 mg Oral QID Jonetta Osgood, MD   125 mg at 02/02/20 1301  . warfarin (COUMADIN)  tablet 5 mg  5 mg Oral ONCE-1800 Ghimire, Henreitta Leber, MD      . Warfarin - Pharmacist Dosing Inpatient   Does not apply [id number] Jonetta Osgood, MD         Discharge Medications: Please see discharge summary for a list of discharge medications.  Relevant Imaging Results:  Relevant Lab Results:   Additional Information SS# [id number]  Alexander [place], LCSW

## 2020-02-02 NOTE — Progress Notes (Signed)
[place] for Coumadin Indication: atrial fibrillation  Allergies  Allergen Reactions  . Levofloxacin Rash    "thought they were sticking swords thru me"  . Klor-Con [Potassium Chloride Er] Diarrhea    Patient Measurements: Height: 6' (182.9 cm) Weight: 134 lb (60.8 kg) IBW/kg (Calculated) : 77.6  Vital Signs: Temp: 97.5 F (36.4 C) (03/11 0745) Temp Source: Oral (03/11 0745) BP: 115/76 (03/11 0745) Pulse Rate: 70 (03/11 0436)  Labs: Recent Labs    01/31/20 0020 01/31/20 0020 01/31/20 0530 02/01/20 0500 02/02/20 0239 02/02/20 0733  HGB 12.7*   < >  --  11.6*  --  12.2*  HCT 39.6  --   --  35.6*  --  37.7*  PLT 177  --   --  166  --  170  LABPROT 68.4*   < > 88.1* 33.2* 27.9*  --   INR 8.2*   < > >10.0* 3.3* 2.6*  --   CREATININE 2.14*   < > 1.91* 1.36*  --  1.17   < > = values in this interval not displayed.    Estimated Creatinine Clearance: 34.6 mL/min (by C-G formula based on SCr of 1.17 mg/dL).   Assessment: 86yo male admitted for ongoing diarrhea after full C.diff treatment with vancomycin, to resume Coumadin for Afib; s/p Vitamin K 3/9, to resume warfarin today  INR 2.6  Dose prior to admission 7.5 mg MWF, 5 mg other days  Goal of Therapy:  INR 2-3   Plan:  Warfarin 5 mg po x 1 dose tonight Daily INR  Thank you Anette Guarneri, PharmD [phone]  02/02/2020,9:28 AM

## 2020-02-02 NOTE — TOC Initial Note (Signed)
Transition of Care [facility]) - Initial/Assessment Note    Patient Details  Name: [name] [name] MRN: [id number] Date of Birth: 06/18/1925  [facility]) CM/SW Contact:    Alexander Mt, LCSW Phone Number: 02/02/2020, 3:09 PM  Clinical Narrative:                 CSW spoke with pt daughter [name] via telephone (signed release in chart). Introduced self, role, reason for visit. Pt from home alone. Pt daughters provide support as able, they confirmed pt had been at [facility] had stopped covering at about 2 weeks of SNF placement. They would like for CSW to work on placement for pt if possible through his [employer]. Pt wife is in LTC at [facility] due to her dementia.She is aware he likely is in copay days and she states that usually [employer] covers his copayments.   CSW has reached out to [facility], has requested new COVID test from Dr. Sloan Leiter and RN will swab. CSW has also started insurance auth for [employer] with [employer].   Expected Discharge Plan: Skilled Nursing Facility Barriers to Discharge: Continued Medical Work up, Ship broker   Patient Goals and CMS Choice Patient states their goals for this hospitalization and ongoing recovery are:: placement for strengthening and supervision CMS Medicare.gov Compare Post Acute Care list provided to:: Patient Represenative (must comment) Choice offered to / list presented to : Adult Children  Expected Discharge Plan and Services Expected Discharge Plan: [place] In-house Referral: Clinical Social Work Discharge Planning Services: CM Consult Post Acute Care Choice: [place], [place] Living arrangements for the past 2 months: Single Family Home    Prior Living Arrangements/Services Living arrangements for the past 2 months: Single Family Home Lives with:: Self Patient language and need for interpreter reviewed:: Yes(no needs) Do you feel safe  going back to the place where you live?: Yes          Current home services: DME Criminal Activity/Legal Involvement Pertinent to Current Situation/Hospitalization: No - Comment as needed   Permission Sought/Granted Permission sought to share information with : Facility Sport and exercise psychologist, Family Supports Permission granted to share information with : Yes, Release of Information Signed  Share Information with NAME: Jettie Booze  Permission granted to share info w AGENCY: [facility]  Permission granted to share info w Relationship: daughter  Permission granted to share info w Contact Information: [phone]  Emotional Assessment Appearance:: Other (Comment Required(telephonic assessment with pt daughter [name]) Attitude/Demeanor/Rapport: Other (comment)(telephonic assessment with pt daughter [name]) Affect (typically observed): Other (comment)(telephonic assessment with pt daughter [name]) Orientation: : Oriented to Situation, Oriented to  Time, Oriented to Place, Oriented to Self Alcohol / Substance Use: Not Applicable Psych Involvement: (n/a)  Admission diagnosis:  Diarrhea [R19.7] Hypokalemia [E87.6] Guaiac positive stools [R19.5] Acute kidney injury (nontraumatic) (HCC) [N17.9] Warfarin-induced coagulopathy (HCC) [id number], T45.515A] Macrocytic anemia [D53.9] Hypotension [I95.9] Diarrhea, unspecified type [R19.7] Patient Active Problem List   Diagnosis Date Noted  . Hypotension 01/31/2020  . Chronic systolic CHF (congestive heart failure) ([place]) 12/31/2019  . Malignant melanoma of skin of back ([place]) 12/31/2019  . Rheumatoid arthritis ([place]) 12/31/2019  . AKI (acute kidney injury) ([place]) 12/31/2019  . Swelling 07/29/2019  . S/P TAVR (transcatheter aortic valve replacement)   . Urinary frequency 06/10/2018  . Lower respiratory infection 12/18/2017  . Iron deficiency anemia 12/16/2016  . Severe aortic stenosis 04/10/2015  . Chronic anticoagulation 02/17/2015  . Bilateral carotid  artery disease (  [place]) 09/12/2014  . Acute on chronic diastolic heart failure ([place]) 07/10/2014  . Hypothyroidism 07/04/2013  . Occlusion and stenosis of carotid artery without mention of cerebral infarction 05/18/2012  . Diarrhea 02/07/2012  . COPD with asthma ([place]) 01/26/2012  . Sepsis associated hypotension ([place]) 11/27/2011  . Hypokalemia 11/26/2011  . Long term (current) use of anticoagulants 02/24/2011  . Atrial fibrillation ([place]) 01/12/2009  . HLD (hyperlipidemia) 06/15/2007  . Benign essential HTN 06/15/2007   PCP:  [name], NP Pharmacy:   [facility], [place] [address] Phone: [phone] Fax: [phone]  [facility] [place], [place] [address] [place] [address] Phone: [phone] Fax: [phone]   Readmission Risk Interventions Readmission Risk Prevention Plan 02/02/2020  Transportation Screening Complete  Medication Review (RN Care Manager) Referral to Pharmacy  PCP or Specialist appointment within 3-5 days of discharge Not Complete  PCP/Specialist Appt Not Complete comments preference for SNF  HRI or Home Care Consult Complete  SW Recovery Care/Counseling Consult Complete  Palliative Care Screening Not Complete  Comments may be worth completing moving forward.  Skilled Nursing Facility Complete  Some recent data might be hidden

## 2020-02-02 NOTE — Progress Notes (Signed)
Spoke with patient-and then his daughter [name]-remains a DNR.

## 2020-02-03 LAB — CBC
HCT: 35.2 % — ABNORMAL LOW (ref 39.0–52.0)
Hemoglobin: 11.7 g/dL — ABNORMAL LOW (ref 13.0–17.0)
MCH: 32.2 pg (ref 26.0–34.0)
MCHC: 33.2 g/dL (ref 30.0–36.0)
MCV: 97 fL (ref 80.0–100.0)
Platelets: 155 10*3/uL (ref 150–400)
RBC: 3.63 MIL/uL — ABNORMAL LOW (ref 4.22–5.81)
RDW: 15.3 % (ref 11.5–15.5)
WBC: 8 10*3/uL (ref 4.0–10.5)
nRBC: 0 % (ref 0.0–0.2)

## 2020-02-03 LAB — GI PATHOGEN PANEL BY PCR, STOOL
Adenovirus F 40/41: NOT DETECTED
Astrovirus: NOT DETECTED
Campylobacter by PCR: NOT DETECTED
Cryptosporidium by PCR: NOT DETECTED
Cyclospora cayetanensis: NOT DETECTED
E coli (ETEC) LT/ST: NOT DETECTED
E coli (STEC): NOT DETECTED
E coli 0157 by PCR: NOT DETECTED
Entamoeba histolytica: NOT DETECTED
Enteroaggregative E coli: NOT DETECTED
Enteropathogenic E coli: NOT DETECTED
G lamblia by PCR: NOT DETECTED
Norovirus GI/GII: NOT DETECTED
Plesiomonas shigelloides: NOT DETECTED
Rotavirus A by PCR: NOT DETECTED
Salmonella by PCR: NOT DETECTED
Sapovirus: NOT DETECTED
Shigella by PCR: NOT DETECTED
Vibrio cholerae: NOT DETECTED
Vibrio: NOT DETECTED
Yersinia enterocolitica: NOT DETECTED

## 2020-02-03 LAB — BASIC METABOLIC PANEL
Anion gap: 7 (ref 5–15)
BUN: 21 mg/dL (ref 8–23)
CO2: 25 mmol/L (ref 22–32)
Calcium: 7.9 mg/dL — ABNORMAL LOW (ref 8.9–10.3)
Chloride: 108 mmol/L (ref 98–111)
Creatinine, Ser: 1.1 mg/dL (ref 0.61–1.24)
GFR calc Af Amer: >60 mL/min (ref >60)
GFR calc non Af Amer: 58 mL/min — ABNORMAL LOW (ref >60)
Glucose, Bld: 93 mg/dL (ref 70–99)
Potassium: 3.9 mmol/L (ref 3.5–5.1)
Sodium: 140 mmol/L (ref 135–145)

## 2020-02-03 LAB — PROTIME-INR
INR: 2.4 — ABNORMAL HIGH (ref 0.8–1.2)
Prothrombin Time: 25.8 seconds — ABNORMAL HIGH (ref 11.4–15.2)

## 2020-02-03 MED ORDER — VANCOMYCIN 50 MG/ML ORAL SOLUTION: 125.0000 mg | Freq: Four times a day (QID) | ORAL | 0 refills | Status: AC

## 2020-02-03 MED ORDER — WARFARIN SODIUM 5 MG PO TABS: 5.0000 mg | ORAL_TABLET | Freq: Once | ORAL | Status: DC

## 2020-02-03 MED ORDER — VANCOMYCIN 50 MG/ML ORAL SOLUTION: 125.0000 mg | ORAL | Status: DC

## 2020-02-03 MED ORDER — METOPROLOL SUCCINATE ER 25 MG PO TB24
12.5000 mg | ORAL_TABLET | Freq: Every day | ORAL | Status: DC
  Administered 2020-02-03: 12.5 mg via ORAL
  Filled 2020-02-03: qty 1

## 2020-02-03 MED ORDER — VANCOMYCIN 50 MG/ML ORAL SOLUTION: 125.0000 mg | Freq: Every day | ORAL | Status: DC

## 2020-02-03 MED ORDER — VANCOMYCIN 50 MG/ML ORAL SOLUTION: 125.0000 mg | Freq: Two times a day (BID) | ORAL | Status: AC

## 2020-02-03 MED ORDER — VANCOMYCIN 50 MG/ML ORAL SOLUTION
125.0000 mg | Freq: Four times a day (QID) | ORAL | Status: DC
  Administered 2020-02-03: 125 mg via ORAL
  Filled 2020-02-03 (×4): qty 2.5

## 2020-02-03 MED ORDER — VANCOMYCIN 50 MG/ML ORAL SOLUTION: 125.0000 mg | Freq: Two times a day (BID) | ORAL | Status: DC

## 2020-02-03 NOTE — Discharge Summary (Signed)
PATIENT DETAILS Name: [name] [name] Age: 86 y.o. Sex: male Date of Birth: 1925-03-28 MRN: [id number]. Admitting Physician: Shela Leff, MD [id number], Tommi Rumps, NP  Admit Date: 01/30/2020 Discharge date: 02/03/2020  Recommendations for Outpatient Follow-up:  1. Follow up with PCP in 1-2 weeks 2. Please obtain CMP/CBC in one week 3. Please check INR on 3/15-and continue to optimize Coumadin dosing.  Admitted From:  Home  Disposition: SNF   Home Health: No  Equipment/Devices: None  Discharge Condition: Stable  CODE STATUS: DNR  Diet recommendation:  Diet Order            Diet - low sodium heart healthy        Diet Heart Room service appropriate? Yes; Fluid consistency: Thin  Diet effective now              Brief Narrative: Patient is a 86 y.o. male with history of chronic atrial fibrillation on anticoagulation, chronic systolic heart failure, severe AS s/p TAVR (12/15/2017)-recently hospitalized from 2/5-2/9 for C. difficile colitis-presenting back with diarrhea-highly suggestive of recurrent C. difficile colitis.  See below for further details.  Significant events: 2/5-2/9>> C. difficile colitis  Antimicrobial therapy: 3/9>> oral vancomycin-planned 6 week taper  Microbiology data: 3/9>> stool C. difficile antigen and PCR positive 2/6>> stool C. difficile positive  Procedures : None  Consults: None  DVT Prophylaxis : Coumadin  Brief Hospital Course: Recurrent C. difficile colitis: Diarrhea has resolved-we will continue with 6-week taper of oral vancomycin (discussed with ID-Dr. Baxter Flattery over the phone). Continue to minimize antibiotic use in the future.   AKI: Hemodynamically mediated in the setting of diarrhea-improved with supportive care and improvement in diarrhea.  Hypokalemia: Secondary to GI loss-repleted  Hypotension: Resolved.  Secondary to volume loss in the setting of diarrhea.  Briefly treated with midodrine-admitting MD  placed on empiric IV hydrocortisone for concern for adrenal insufficiency-however a.m. cortisol was in the 50s.  No longer on midodrine/steroids.  Supratherapeutic INR: No evidence of overt bleeding-reported minimally blood streaked stools apparently on admission.  Given vitamin K on admission-INR now therapeutic   History of rheumatoid arthritis: Methotrexate was held on admission-okay to resume on discharge now that diarrhea is significantly improved.  Chronic systolic heart failure (EF 20-25% by TTE on 09/19/2019): Volume status is stable-follow.  PAF: Rate controlled-INR therapeutic-resume usual dosing of Coumadin.  Please recheck INR on 3/15 at SNF-and adjust dosing of Coumadin accordingly.  Beta-blocker briefly held on admission due to hypotension-has been resumed.  BP remained stable.    History of AS-s/p TAVR in 2019  BPH: Resume Flomax.   Per prior notes-patient apparently has HIV-daughter not aware of this diagnosis.  HIV antibody negative.  No suggestion that patient has HIV at this point.    Discharge Diagnoses:  Principal Problem:   Diarrhea Active Problems:   Atrial fibrillation (HCC)   Hypokalemia   AKI (acute kidney injury) ([place])   Hypotension   Discharge Instructions:  Activity:  As tolerated with Full fall precautions use walker/cane & assistance as needed   Discharge Instructions    (HEART FAILURE PATIENTS) Call MD:  Anytime you have any of the following symptoms: 1) 3 pound weight gain in 24 hours or 5 pounds in 1 week 2) shortness of breath, with or without a dry hacking cough 3) swelling in the hands, feet or stomach 4) if you have to sleep on extra pillows at night in order to breathe.   Complete by: As directed    Call MD  for:  persistant nausea and vomiting   Complete by: As directed    Diet - low sodium heart healthy   Complete by: As directed    Increase activity slowly   Complete by: As directed      Allergies as of 02/03/2020       Reactions   Levofloxacin Rash   "thought they were sticking swords thru me"   Klor-con [potassium Chloride Er] Diarrhea      Medication List    STOP taking these medications   HEALTHY COLON PO     TAKE these medications   acetaminophen 325 MG tablet Commonly known as: TYLENOL Take 2 tablets (650 mg total) by mouth every 6 (six) hours as needed for mild pain (or Fever >/= 101).   atorvastatin 10 MG tablet Commonly known as: LIPITOR TAKE 1 TABLET AT BEDTIME   CALTRATE 600 PLUS-VIT D PO Take 1 tablet by mouth at bedtime.   folic acid 1 MG tablet Commonly known as: FOLVITE Take 1 mg by mouth daily.   guaiFENesin 600 MG 12 hr tablet Commonly known as: MUCINEX Take 600-1,200 mg by mouth 2 (two) times daily as needed (cold/congestion.).   methotrexate 2.5 MG tablet Commonly known as: RHEUMATREX Take 10 mg by mouth once a week. Caution:Chemotherapy. Protect from light. On Saturdays   metoprolol succinate 25 MG 24 hr tablet Commonly known as: TOPROL-XL Take 12.5 mg by mouth every evening.   multivitamin with minerals Tabs tablet Take 1 tablet by mouth daily.   nitroGLYCERIN 0.4 MG SL tablet Commonly known as: NITROSTAT DISSOLVE 1 TABLET UNDER THE TONGUE EVERY 5 MINUTES AS NEEDED FOR CHEST PAIN What changed: See the new instructions.   potassium chloride SA 20 MEQ tablet Commonly known as: KLOR-CON Take 2 tablets (40 mEq total) by mouth every morning. What changed: how much to take   tamsulosin 0.4 MG Caps capsule Commonly known as: FLOMAX Take 1 capsule (0.4 mg total) by mouth every evening.   vancomycin 50 mg/mL  oral solution Commonly known as: VANCOCIN Take 2.5 mLs (125 mg total) by mouth 4 (four) times daily for 11 days.   vancomycin 50 mg/mL  oral solution Commonly known as: VANCOCIN Take 2.5 mLs (125 mg total) by mouth 2 (two) times daily. Start taking on: February 17, 2020   vancomycin 50 mg/mL  oral solution Commonly known as: VANCOCIN Take 2.5 mLs (125  mg total) by mouth daily. Start taking on: February 24, 2020   vancomycin 50 mg/mL  oral solution Commonly known as: VANCOCIN Take 2.5 mLs (125 mg total) by mouth every other day. Start taking on: March 02, 2020   vancomycin 50 mg/mL  oral solution Commonly known as: VANCOCIN Take 2.5 mLs (125 mg total) by mouth every 3 (three) days. Start taking on: March 10, 2020   warfarin 5 MG tablet Commonly known as: COUMADIN Take as directed. If you are unsure how to take this medication, talk to your nurse or doctor. Original instructions: Take 1 tablet daily except take 1 1/2 tablets Mon Wed and Fridays or Take as directed by anticoagulation clinic What changed:   how much to take  how to take this  when to take this       Contact information for follow-up providers    [name], NP. Schedule an appointment as soon as possible for a visit in 1 week(s).   Specialty: Family Medicine Contact information: [address] [place] [address] [phone]  Dorothy Spark, MD. Schedule an appointment as soon as possible for a visit in 1 month(s).   Specialty: Cardiology Contact information: [place] [address] [phone]            Contact information for after-discharge care    Destination    [facility]erred [facility] .   Service: Skilled Nursing Contact information: [place] [phone]                 Allergies  Allergen Reactions  . Levofloxacin Rash    "thought they were sticking swords thru me"  . Klor-Con [Potassium Chloride Er] Diarrhea    Consultations:   None   Other Procedures/Studies: CT ABDOMEN PELVIS WO CONTRAST  Result Date: 01/31/2020 CLINICAL DATA:  Persistent diarrhea. History of prior C difficile colitis. EXAM: CT ABDOMEN AND PELVIS WITHOUT CONTRAST TECHNIQUE: Multidetector CT imaging of the abdomen and pelvis was  performed following the standard protocol without IV contrast. COMPARISON:  CT scan 12/31/2019 FINDINGS: Lower chest: Very small right pleural effusion and minimal dependent bibasilar atelectasis. No worrisome pulmonary lesions. Stable advanced aortic and coronary artery calcifications and evidence of prior TAVR. No pericardial effusion. Hepatobiliary: No obvious hepatic lesions or intrahepatic biliary dilatation. The gallbladder is grossly normal. No common bile duct dilatation. Pancreas: No mass, inflammation or ductal dilatation. Age related pancreatic atrophy. Moderate-sized duodenal diverticulum noted near the pancreatic head. Spleen: Normal size. No focal lesions. Adrenals/Urinary Tract: Stable small adrenal gland adenomas. No renal, ureteral or bladder calculi or mass. Stomach/Bowel: The stomach, duodenum and small bowel are unremarkable. No acute inflammatory changes, mass lesions or obstructive findings. Mild diffuse wall thickening involving the rectum and sigmoid colon suggesting a mild inflammatory or infectious colitis. The remainder of the colon is unremarkable. Vascular/Lymphatic: Stable advanced atherosclerotic calcifications involving the aorta and iliac arteries and branch vessels. No mesenteric or retroperitoneal mass or adenopathy. Reproductive: The prostate gland and seminal vesicles are unremarkable. Other: Stable left inguinal hernia containing fat. Musculoskeletal: No significant bony findings. Stable degenerative changes involving the spine and hips. IMPRESSION: 1. Mild diffuse wall thickening involving the rectum and sigmoid colon suggesting a mild inflammatory or infectious colitis. 2. No other significant abdominal/pelvic findings, mass lesions or adenopathy. 3. Stable advanced atherosclerotic calcifications involving the aorta and branch vessels. Aortic Atherosclerosis (ICD10-I70.0). Electronically Signed   By: Marijo Sanes M.D.   On: 01/31/2020 07:44      TODAY-DAY OF  DISCHARGE:  Subjective:   Adan Sis today has no headache,no chest abdominal pain,no new weakness tingling or numbness, feels much better wants to go home today.   Objective:   Blood pressure 109/74, pulse 86, temperature 97.6 F (36.4 C), temperature source Oral, resp. rate 18, height 6' (1.829 m), weight 72 kg, SpO2 95 %.  Intake/Output Summary (Last 24 hours) at 02/03/2020 0913 Last data filed at 02/02/2020 1250 Gross per 24 hour  Intake --  Output 1 ml  Net -1 ml   Filed Weights   01/30/20 2337 02/03/20 0427  Weight: 60.8 kg 72 kg    Exam: Awake Alert, Oriented *3, No new F.N deficits, Normal affect [place].AT,PERRAL Supple Neck,No JVD, No cervical lymphadenopathy appriciated.  Symmetrical Chest wall movement, Good air movement bilaterally, CTAB RRR,No Gallops,Rubs or new Murmurs, No Parasternal Heave +ve B.Sounds, Abd Soft, Non tender, No organomegaly appriciated, No rebound -guarding or rigidity. No Cyanosis, Clubbing or edema, No new Rash or bruise   PERTINENT RADIOLOGIC  STUDIES: No results found.   PERTINENT LAB RESULTS: CBC: Recent Labs    02/02/20 0733 02/03/20 0318  WBC 7.9 8.0  HGB 12.2* 11.7*  HCT 37.7* 35.2*  PLT 170 155   CMET CMP     Component Value Date/Time   NA 140 02/03/2020 0318   NA 141 09/19/2019 1554   NA 139 05/05/2017 1437   K 3.9 02/03/2020 0318   K 3.9 05/05/2017 1437   CL 108 02/03/2020 0318   CO2 25 02/03/2020 0318   CO2 32 (H) 05/05/2017 1437   GLUCOSE 93 02/03/2020 0318   GLUCOSE 88 05/05/2017 1437   BUN 21 02/03/2020 0318   BUN 19 09/19/2019 1554   BUN 12.4 05/05/2017 1437   CREATININE 1.10 02/03/2020 0318   CREATININE 1.19 11/02/2019 1447   CREATININE 1.1 05/05/2017 1437   CALCIUM 7.9 (L) 02/03/2020 0318   CALCIUM 9.5 05/05/2017 1437   PROT 4.6 (L) 02/01/2020 0500   PROT 6.4 09/19/2019 1554   PROT 6.2 (L) 05/05/2017 1437   ALBUMIN 2.2 (L) 02/01/2020 0500   ALBUMIN 3.9 09/19/2019 1554   ALBUMIN 3.5 05/05/2017  1437   AST 12 (L) 02/01/2020 0500   AST 22 11/02/2019 1447   AST 45 (H) 05/05/2017 1437   ALT 6 02/01/2020 0500   ALT 11 11/02/2019 1447   ALT 31 05/05/2017 1437   ALKPHOS 39 02/01/2020 0500   ALKPHOS 62 05/05/2017 1437   BILITOT 0.9 02/01/2020 0500   BILITOT 0.4 11/02/2019 1447   BILITOT 0.75 05/05/2017 1437   GFRNONAA 58 (L) 02/03/2020 0318   GFRNONAA 53 (L) 11/02/2019 1447   GFRAA >60 02/03/2020 0318   GFRAA >60 11/02/2019 1447    GFR Estimated Creatinine Clearance: 43.6 mL/min (by C-G formula based on SCr of 1.1 mg/dL). No results for input(s): LIPASE, AMYLASE in the last 72 hours. No results for input(s): CKTOTAL, CKMB, CKMBINDEX, TROPONINI in the last 72 hours. Invalid input(s): POCBNP No results for input(s): DDIMER in the last 72 hours. No results for input(s): HGBA1C in the last 72 hours. No results for input(s): CHOL, HDL, LDLCALC, TRIG, CHOLHDL, LDLDIRECT in the last 72 hours. No results for input(s): TSH, T4TOTAL, T3FREE, THYROIDAB in the last 72 hours.  Invalid input(s): FREET3 No results for input(s): VITAMINB12, FOLATE, FERRITIN, TIBC, IRON, RETICCTPCT in the last 72 hours. Coags: Recent Labs    02/02/20 0239 02/03/20 0318  INR 2.6* 2.4*   Microbiology: Recent Results (from the past 240 hour(s))  Respiratory Panel by RT PCR (Flu A&B, Covid) - Nasopharyngeal Swab     Status: None   Collection Time: 01/31/20  4:04 AM   Specimen: Nasopharyngeal Swab  Result Value Ref Range Status   SARS Coronavirus 2 by RT PCR NEGATIVE NEGATIVE Final    Comment: (NOTE) SARS-CoV-2 target nucleic acids are NOT DETECTED. The SARS-CoV-2 RNA is generally detectable in upper respiratoy specimens during the acute phase of infection. The lowest concentration of SARS-CoV-2 viral copies this assay can detect is 131 copies/mL. A negative result does not preclude SARS-Cov-2 infection and should not be used as the sole basis for treatment or other patient management decisions. A  negative result may occur with  improper specimen collection/handling, submission of specimen other than nasopharyngeal swab, presence of viral mutation(s) within the areas targeted by this assay, and inadequate number of viral copies (<131 copies/mL). A negative result must be combined with clinical observations, patient history, and epidemiological information. The expected result is Negative. Fact Sheet for Patients:  PinkCheek.be Fact Sheet for Healthcare Providers:  GravelBags.it This test is not yet ap proved or cleared by the [place] FDA and  has been authorized for detection and/or diagnosis of SARS-CoV-2 by FDA under an Emergency Use Authorization (EUA). This EUA will remain  in effect (meaning this test can be used) for the duration of the COVID-19 declaration under Section 564(b)(1) of the Act, 21 U.S.C. section 360bbb-3(b)(1), unless the authorization is terminated or revoked sooner.    Influenza A by PCR NEGATIVE NEGATIVE Final   Influenza B by PCR NEGATIVE NEGATIVE Final    Comment: (NOTE) The Xpert Xpress SARS-CoV-2/FLU/RSV assay is intended as an aid in  the diagnosis of influenza from Nasopharyngeal swab specimens and  should not be used as a sole basis for treatment. Nasal washings and  aspirates are unacceptable for Xpert Xpress SARS-CoV-2/FLU/RSV  testing. Fact Sheet for Patients: PinkCheek.be Fact Sheet for Healthcare Providers: GravelBags.it This test is not yet approved or cleared by the [place] FDA and  has been authorized for detection and/or diagnosis of SARS-CoV-2 by  FDA under an Emergency Use Authorization (EUA). This EUA will remain  in effect (meaning this test can be used) for the duration of the  Covid-19 declaration under Section 564(b)(1) of the Act, 21  U.S.C. section 360bbb-3(b)(1), unless the authorization is   terminated or revoked. Performed at [facility], [place] [address], [place], [place] [address]   C difficile quick scan w PCR reflex     Status: Abnormal   Collection Time: 01/31/20  6:37 AM   Specimen: STOOL  Result Value Ref Range Status   C Diff antigen POSITIVE (A) NEGATIVE Final   C Diff toxin POSITIVE (A) NEGATIVE Final   C Diff interpretation Toxin producing C. difficile detected.  Final    Comment: CRITICAL RESULT CALLED TO, READ BACK BY AND VERIFIED WITH: Sammie Bench RN 8:50 01/31/20 ([email or web address]) Performed at [facility], [place] [address] [place], [place] [address]   GI pathogen panel by PCR, stool     Status: None   Collection Time: 01/31/20  6:37 AM   Specimen: STOOL  Result Value Ref Range Status   Plesiomonas shigelloides NOT DETECTED NOT DETECTED Final   Yersinia enterocolitica NOT DETECTED NOT DETECTED Final   Vibrio NOT DETECTED NOT DETECTED Final   Enteropathogenic E coli NOT DETECTED NOT DETECTED Final   E coli (ETEC) LT/ST NOT DETECTED NOT DETECTED Final   E coli 0157 by PCR NOT DETECTED NOT DETECTED Final   Cryptosporidium by PCR NOT DETECTED NOT DETECTED Final   Entamoeba histolytica NOT DETECTED NOT DETECTED Final   Adenovirus F 40/41 NOT DETECTED NOT DETECTED Final   Norovirus GI/GII NOT DETECTED NOT DETECTED Final   Sapovirus NOT DETECTED NOT DETECTED Final    Comment: (NOTE) Performed At: [facility] [place], [place] [id number] Rush Farmer MD [id number]    Vibrio cholerae NOT DETECTED NOT DETECTED Final   Campylobacter by PCR NOT DETECTED NOT DETECTED Final   Salmonella by PCR NOT DETECTED NOT DETECTED Final   E coli (STEC) NOT DETECTED NOT DETECTED Final   Enteroaggregative E coli NOT DETECTED NOT DETECTED Final   Shigella by PCR NOT DETECTED NOT DETECTED Final   Cyclospora cayetanensis NOT DETECTED NOT DETECTED Final   Astrovirus NOT DETECTED NOT DETECTED Final   G lamblia by PCR NOT DETECTED NOT DETECTED  Final   Rotavirus A by PCR NOT DETECTED NOT DETECTED Final  SARS CORONAVIRUS 2 (TAT 6-24 HRS) Nasopharyngeal Nasopharyngeal Swab     Status: None   Collection Time: 02/02/20  3:50 PM   Specimen: Nasopharyngeal Swab  Result Value Ref Range Status   SARS Coronavirus 2 NEGATIVE NEGATIVE Final    Comment: (NOTE) SARS-CoV-2 target nucleic acids are NOT DETECTED. The SARS-CoV-2 RNA is generally detectable in upper and lower respiratory specimens during the acute phase of infection. Negative results do not preclude SARS-CoV-2 infection, do not rule out co-infections with other pathogens, and should not be used as the sole basis for treatment or other patient management decisions. Negative results must be combined with clinical observations, patient history, and epidemiological information. The expected result is Negative. Fact Sheet for Patients: SugarRoll.be Fact Sheet for Healthcare Providers: https://www.woods-mathews.com/ This test is not yet approved or cleared by the [place] FDA and  has been authorized for detection and/or diagnosis of SARS-CoV-2 by FDA under an Emergency Use Authorization (EUA). This EUA will remain  in effect (meaning this test can be used) for the duration of the COVID-19 declaration under Section 56 4(b)(1) of the Act, 21 U.S.C. section 360bbb-3(b)(1), unless the authorization is terminated or revoked sooner. Performed at [facility], [place] [address], [place], [place] [address]     FURTHER DISCHARGE INSTRUCTIONS:  Get Medicines reviewed and adjusted: Please take all your medications with you for your next visit with your Primary MD  Laboratory/radiological data: Please request your Primary MD to go over all hospital tests and procedure/radiological results at the follow up, please ask your Primary MD to get all Hospital records sent to his/her office.  In some cases, they will be blood work, cultures and  biopsy results pending at the time of your discharge. Please request that your primary care M.D. goes through all the records of your hospital data and follows up on these results.  Also Note the following: If you experience worsening of your admission symptoms, develop shortness of breath, life threatening emergency, suicidal or homicidal thoughts you must seek medical attention immediately by calling 911 or calling your MD immediately  if symptoms less severe.  You must read complete instructions/literature along with all the possible adverse reactions/side effects for all the Medicines you take and that have been prescribed to you. Take any new Medicines after you have completely understood and accpet all the possible adverse reactions/side effects.   Do not drive when taking Pain medications or sleeping medications (Benzodaizepines)  Do not take more than prescribed Pain, Sleep and Anxiety Medications. It is not advisable to combine anxiety,sleep and pain medications without talking with your primary care practitioner  Special Instructions: If you have smoked or chewed Tobacco  in the last 2 yrs please stop smoking, stop any regular Alcohol  and or any Recreational drug use.  Wear Seat belts while driving.  Please note: You were cared for by a hospitalist during your hospital stay. Once you are discharged, your primary care physician will handle any further medical issues. Please note that NO REFILLS for any discharge medications will be authorized once you are discharged, as it is imperative that you return to your primary care physician (or establish a relationship with a primary care physician if you do not have one) for your post hospital discharge needs so that they can reassess your need for medications and monitor your lab values.  Total Time spent coordinating discharge including counseling, education and face to face time equals 35 minutes.  Signed: Oren Binet 02/03/2020  9:13  AM

## 2020-02-03 NOTE — TOC Transition Note (Signed)
Transition of Care [facility]) - CM/SW Discharge Note   Patient Details  Name: [name] [name] MRN: [id number] Date of Birth: 1925/05/31  [facility] CM/SW Contact:  Vinie Sill, [place] Phone Number: 02/03/2020, 1:53 PM   Clinical Narrative:     [name] DC Date: 02/03/2020 Family Notified: [name],daughter Transport [id number]  RN, patient, and facility notified of DC. Discharge Summary sent to facility. RN given number for [phone], Room 3209. Ambulance transport requested for patient.   Clinical Social Worker signing off.  Thurmond Butts, MSW, [place] Clinical Social Worker     Final next level of care: Skilled Nursing Facility Barriers to Discharge: No Barriers Identified   Patient Goals and CMS Choice Patient states their goals for this hospitalization and ongoing recovery are:: placement for strengthening and supervision CMS Medicare.gov Compare Post Acute Care list provided to:: Patient Represenative (must comment) Choice offered to / list presented to : Adult Children  Discharge Placement              [facility] at[name]) Patient to be transferred to facility by: [place] Name of family member notified: [name],daughter Patient and family notified of of transfer: 02/03/20  Discharge Plan and Services In-house Referral: Clinical Social Work Discharge Planning Services: CM Consult Post Acute Care Choice: Durable Medical Equipment, [place]                               Social Determinants of Health (SDOH) Interventions     Readmission Risk Interventions Readmission Risk Prevention Plan 02/02/2020  Transportation Screening Complete  Medication Review Press photographer) Referral to Pharmacy  PCP or Specialist appointment within 3-5 days of discharge Not Complete  PCP/Specialist Appt Not Complete comments preference for SNF  HRI or Home Care Consult Complete  SW Recovery  Care/Counseling Consult Complete  Palliative Care Screening Not Complete  Comments may be worth completing moving forward.  Skilled Nursing Facility Complete  Some recent data might be hidden

## 2020-02-03 NOTE — Progress Notes (Signed)
Physical Therapy Treatment Patient Details Name: [name] [name] MRN: [id number] DOB: 06-Sep-1925 Today's Date: 02/03/2020    History of Present Illness 86 y/o male w/ hx of TIA, CVA, skin cancer, s/p TAVR, RA, hypothyroid, HTN, HIV infection, GERD, DDD, chronic diastolic CHF EF 0000000 (123XX123), carotid occlusion, chronic a-fib, R 3rd ray amputation 2016. Presented to ED w/ c/o ongoing diarrhea, wa admitted approx 1 mos ago w/ c-diff. first COVID vaccine 01/28/20. Pt from [facility]. Received notification patient admitted to [facility] on 01/31/20 for Acute kidney injury, Hypokalemia, Hypotension, supratherapeutic INR.     PT Comments    Patient progressing slowly towards PT goals. Requires Mod A to stand from EOB. Less assist needed for gait training today (Min A) with use of RW. Very slow gait speed putting pt at increased risk for falls. HR up to 129 bpm with mobility. Follows simple commands without difficulty. Continue to recommend SNF. Will follow.    Follow Up Recommendations  SNF     Equipment Recommendations  None recommended by PT    Recommendations for Other Services       Precautions / Restrictions Precautions Precautions: Fall Precaution Comments: watch HR Restrictions Weight Bearing Restrictions: No    Mobility  Bed Mobility Overal bed mobility: Needs Assistance Bed Mobility: Rolling;Sidelying to Sit Rolling: Min guard Sidelying to sit: [facility] elevated;Min assist       General bed mobility comments: Cues to reach for rail and for sequencing, assist with trunk to get to EOB.  Transfers Overall transfer level: Needs assistance Equipment used: Rolling walker (2 wheeled) Transfers: Sit to/from Stand Sit to Stand: Mod assist         General transfer comment: Assist to power to standing with cues for hand placement. Transferred to chair post ambulation.  Ambulation/Gait Ambulation/Gait assistance: Min assist Gait Distance (Feet): 18 Feet Assistive  device: Rolling walker (2 wheeled) Gait Pattern/deviations: Step-through pattern;Decreased stride length;Trunk flexed Gait velocity: decreased Gait velocity interpretation: <1.31 ft/sec, indicative of household ambulator General Gait Details: Very slow, mildly unsteady gait with RW. HR up to 129 bpm.   Stairs             Wheelchair Mobility    Modified Rankin (Stroke Patients Only)       Balance Overall balance assessment: Needs assistance;History of Falls Sitting-balance support: Feet supported;No upper extremity supported Sitting balance-Leahy Scale: Fair     Standing balance support: During functional activity Standing balance-Leahy Scale: Poor Standing balance comment: use of RW                            Cognition Arousal/Alertness: Awake/alert Behavior During Therapy: WFL for tasks assessed/performed Overall Cognitive Status: No family/caregiver present to determine baseline cognitive functioning                                 General Comments: Follows commands consistently. Laughing at jokes approrpiately. Stating he is wet and needs bed changed (catheter not hooked up correctly)      Exercises      General Comments General comments (skin integrity, edema, etc.): HR up to 129 bpm.      Pertinent Vitals/Pain Pain Assessment: No/denies pain    Home Living                      Prior Function  PT Goals (current goals can now be found in the care plan section) Progress towards PT goals: Progressing toward goals    Frequency    Min 2X/week      PT Plan Current plan remains appropriate    Co-evaluation              AM-PAC PT "6 Clicks" Mobility   Outcome Measure  Help needed turning from your back to your side while in a flat bed without using bedrails?: A Little Help needed moving from lying on your back to sitting on the side of a flat bed without using bedrails?: A Little Help needed  moving to and from a bed to a chair (including a wheelchair)?: A Lot Help needed standing up from a chair using your arms (e.g., wheelchair or bedside chair)?: A Lot Help needed to walk in hospital room?: A Little Help needed climbing 3-5 steps with a railing? : A Lot 6 Click Score: 15    End of Session Equipment Utilized During Treatment: Gait belt Activity Tolerance: Patient tolerated treatment well Patient left: in chair;with call bell/phone within reach(no alarm set per tech) Nurse Communication: Mobility status PT Visit Diagnosis: Other abnormalities of gait and mobility (R26.89);Unsteadiness on feet (R26.81);Muscle weakness (generalized) (M62.81);History of falling (Z91.81)     Time: [id number] PT Time Calculation (min) (ACUTE ONLY): 18 min  Charges:  $Therapeutic Activity: 8-22 mins                     Marisa Severin, PT, DPT Acute Rehabilitation Services Pager [phone] Office [phone]       Marguarite Arbour A Sabra Heck 02/03/2020, 12:03 PM

## 2020-02-03 NOTE — Progress Notes (Signed)
Pt discharging to SNF via [facility].  AVS printed and placed with transport paperwork.  Pt assisted to dress after all PIV's and monitoring equipment removed.  Afternoon dose of Vanc given per schedule.  Attempted to call report x's 2 with no answer at [phone].

## 2020-02-03 NOTE — Progress Notes (Signed)
[place] for Coumadin Indication: atrial fibrillation  Allergies  Allergen Reactions  . Levofloxacin Rash    "thought they were sticking swords thru me"  . Klor-Con [Potassium Chloride Er] Diarrhea    Patient Measurements: Height: 6' (182.9 cm) Weight: 158 lb 11.7 oz (72 kg) IBW/kg (Calculated) : 77.6  Vital Signs: Temp: 97.6 F (36.4 C) (03/12 0804) Temp Source: Oral (03/12 0804) BP: 109/74 (03/12 0804) Pulse Rate: 86 (03/12 0804)  Labs: Recent Labs    02/01/20 0500 02/01/20 0500 02/02/20 0239 02/02/20 0733 02/03/20 0318  HGB 11.6*   < >  --  12.2* 11.7*  HCT 35.6*  --   --  37.7* 35.2*  PLT 166  --   --  170 155  LABPROT 33.2*  --  27.9*  --  25.8*  INR 3.3*  --  2.6*  --  2.4*  CREATININE 1.36*  --   --  1.17 1.10   < > = values in this interval not displayed.    Estimated Creatinine Clearance: 43.6 mL/min (by C-G formula based on SCr of 1.1 mg/dL).   Assessment: 86yo male admitted for ongoing diarrhea after full C.diff treatment with vancomycin, to resume Coumadin for Afib; s/p Vitamin K 3/9, warfarin resumed 3/11.  INR 2.4.  No overt bleeding or complications noted.  Dose prior to admission 7.5 mg MWF, 5 mg other days  Goal of Therapy:  INR 2-3   Plan:  Warfarin 5 mg po x 1 dose tonight. Recommend resuming PTA Coumadin dose at discharge with close INR f/u. Daily INR  [place] Crane, Roylene Reason, [facility] Clinical Pharmacist Phone [phone]  02/03/2020 9:18 AM

## 2020-02-03 NOTE — Progress Notes (Signed)
Occupational Therapy Treatment Patient Details Name: [name] [name] MRN: [id number] DOB: July 16, 1925 Today's Date: 02/03/2020    History of present illness 86 y/o male w/ hx of TIA, CVA, skin cancer, s/p TAVR, RA, hypothyroid, HTN, HIV infection, GERD, DDD, chronic diastolic CHF EF 0000000 (123XX123), carotid occlusion, chronic a-fib, R 3rd ray amputation 2016. Presented to ED w/ c/o ongoing diarrhea, wa admitted approx 1 mos ago w/ c-diff. first COVID vaccine 01/28/20. Pt from [facility]. Received notification patient admitted to [facility] on 01/31/20 for Acute kidney injury, Hypokalemia, Hypotension, supratherapeutic INR.    OT comments  Patient seated in chair upon arrival, agreeable to OT. Set up for g/h seated, however patient did not have denture adhesive therefore patient only able to wash his face set up assist. Patient was mod A for sit to stand from recliner and min A ambulation to bedside commode few feet away with increased time. Mod A to stand from commode, min A for thoroughness after bowel movement. Patient LEs appear very weak with difficulty advancing LEs while ambulating. Will continue to follow.    Follow Up Recommendations  SNF    Equipment Recommendations  Other (comment)(defer to next venue)       Precautions / Restrictions Precautions Precautions: Fall Precaution Comments: watch HR Restrictions Weight Bearing Restrictions: No       Mobility Bed Mobility Overal bed mobility: Needs Assistance Bed Mobility: Rolling;Sidelying to Sit Rolling: Min guard Sidelying to sit: HOB elevated;Min assist       General bed mobility comments: seated in chair upon arrival  Transfers Overall transfer level: Needs assistance Equipment used: Rolling walker (2 wheeled) Transfers: Sit to/from Stand Sit to Stand: Mod assist         General transfer comment: mod A to power up to standing    Balance Overall balance assessment: Needs assistance;History of  Falls Sitting-balance support: Feet supported;No upper extremity supported Sitting balance-Leahy Scale: Fair     Standing balance support: During functional activity;Bilateral upper extremity supported Standing balance-Leahy Scale: Poor Standing balance comment: use of RW                           ADL either performed or assessed with clinical judgement   ADL Overall ADL's : Needs assistance/impaired Eating/Feeding: Set up;Sitting   Grooming: Wash/dry face;Set up;Sitting                   Toilet Transfer: Moderate assistance;Ambulation;BSC;RW Toilet Transfer Details (indicate cue type and reason): mod A with sit <> stand from commode, min A to ambulate few feet to/from commode with increased time and difficulty advancing LEs Toileting- Clothing Manipulation and Hygiene: Minimal assistance;Sit to/from stand Toileting - Clothing Manipulation Details (indicate cue type and reason): for thoroughness after bowel movement     Functional mobility during ADLs: Minimal assistance;Rolling walker;Cueing for safety General ADL Comments: patient requiring increased assistance for self care due to decreased strength, activity tolerance, balance               Cognition Arousal/Alertness: Awake/alert Behavior During Therapy: WFL for tasks assessed/performed Overall Cognitive Status: No family/caregiver present to determine baseline cognitive functioning                                 General Comments: follows commands and converses appropriately  General Comments highest HR observed was 160, between 135-140 with activity    Pertinent Vitals/ Pain       Pain Assessment: Faces Faces Pain Scale: Hurts a little bit Pain Location: globally with mobility Pain Descriptors / Indicators: Grimacing Pain Intervention(s): Monitored during session         Frequency  Min 2X/week        Progress Toward Goals  OT Goals(current goals can  now be found in the care plan section)  Progress towards OT goals: Progressing toward goals  Acute Rehab OT Goals Patient Stated Goal: agreeable to placement at dc OT Goal Formulation: With patient Time For Goal Achievement: 02/15/20 Potential to Achieve Goals: Good ADL Goals Pt Will Perform Lower Body Dressing: with min guard assist;sitting/lateral leans;sit to/from stand Pt Will Transfer to Toilet: with supervision;ambulating Pt Will Perform Toileting - Clothing Manipulation and hygiene: with min guard assist;sit to/from stand Pt/caregiver will Perform Home Exercise Program: Increased strength;Both right and left upper extremity;With written HEP provided Additional ADL Goal #1: pt will increase to supervisionA for OOB ADL with good safety awareness.  Plan Discharge plan remains appropriate       AM-PAC OT "6 Clicks" Daily Activity     Outcome Measure   Help from another person eating meals?: None Help from another person taking care of personal grooming?: A Little Help from another person toileting, which includes using toliet, bedpan, or urinal?: A Lot Help from another person bathing (including washing, rinsing, drying)?: A Little Help from another person to put on and taking off regular upper body clothing?: None Help from another person to put on and taking off regular lower body clothing?: A Little 6 Click Score: 19    End of Session Equipment Utilized During Treatment: Gait belt  OT Visit Diagnosis: Unsteadiness on feet (R26.81);Muscle weakness (generalized) (M62.81)   Activity Tolerance Patient tolerated treatment well   Patient Left in chair;with call bell/phone within reach;with chair alarm set           Time: 1112-1140 OT Time Calculation (min): 28 min  Charges: OT General Charges $OT Visit: 1 Visit OT Treatments $Self Care/Home Management : 23-37 mins  Shon Millet OT OT office: [place] 02/03/2020, 12:18 PM

## 2020-02-06 ENCOUNTER — Other Ambulatory Visit: Payer: Self-pay | Admitting: *Deleted

## 2020-02-06 NOTE — Patient Outreach (Signed)
[place] [facility]) Care Management  02/06/2020  [name] [name] 16-Jun-1925 [id number]   [facility] Care Management Case Closure    Referral : 01/26/20 Initial outreach: 01/26/20 Reason: EMMI red alert follow up, Transition of care needs assessed. Insurance : [employer]    Patient has been discharged from inpatient stay at [facility]  to  [facility] SNF on 02/03/20.   Plan Will close case at this time, patient care managed by [facility]. Will send provider case closure letter. Will send patient case closure letter.    Joylene Draft, RN, BSN  [place] Management Coordinator  [phone]- Mobile [phone]- Toll Free Main Office

## 2020-02-07 ENCOUNTER — Encounter: Payer: Self-pay | Admitting: *Deleted

## 2020-02-07 NOTE — Telephone Encounter (Signed)
This encounter was created in error - please disregard.  This encounter was created in error - please disregard.

## 2020-02-09 ENCOUNTER — Telehealth: Payer: Self-pay | Admitting: *Deleted

## 2020-02-09 NOTE — Telephone Encounter (Signed)
Patient's daughter [name] called. Patient was readmitted to hospital and d/c'd to [facility] for rehab yesterday. He is being treated for Cdiff with 6 week course of antibiotics and will be d'cd once he has recovered. She wants to cancel his lab and PET appointment son 3/31 and appointment with Dr. Irene Limbo on 4/7. She will contact office once his discharge date is known to reschedule all the appointments. All appointments cancelled

## 2020-02-16 ENCOUNTER — Other Ambulatory Visit: Payer: Self-pay | Admitting: *Deleted

## 2020-02-16 NOTE — Patient Outreach (Signed)
[place] [facility]) Care Management  02/16/2020  [name] [name] 02/14/1925 [id number]   Multidisciplinary Case Discussion   Patient case was discussed in multidisciplinary case discussion.  Patient current inpatient at [facility] for rehab following recent hospital admission at [facility] 3/8-3/12 Dx: Diarrhea, Acute Kidney injury.    Plan Will monitor for patient discharge from SNF through ping application and assess if further post discharge follow up needed . After discharge from [facility],  Transition of care calls being completed via EMMI-automated calls. RN CM will outreach patient for any red flags received  Joylene Draft, RN, BSN  [place] Management Coordinator  [phone]- Mobile [phone]- [place]

## 2020-02-20 ENCOUNTER — Other Ambulatory Visit: Payer: Medicare Other

## 2020-02-20 ENCOUNTER — Ambulatory Visit: Payer: Medicare Other | Admitting: Hematology

## 2020-02-21 ENCOUNTER — Telehealth: Payer: Self-pay | Admitting: Adult Health

## 2020-02-21 ENCOUNTER — Other Ambulatory Visit: Payer: Self-pay | Admitting: Cardiology

## 2020-02-21 NOTE — Telephone Encounter (Signed)
Spoke to [place] and informed her that per hospital discharge the pt is at [facility] skilled nursing facility.  Gave her # to call.  Nothing further needed.

## 2020-02-21 NOTE — Telephone Encounter (Signed)
Chakilrra, nurse from [facility], stated she is unable to locate the pt. She was told he was going to be discharged to [facility] from the skilled nursing facility but the pt is not there.   Dorian Heckle can be reached at [phone]

## 2020-02-21 NOTE — Telephone Encounter (Signed)
Contact information for after-discharge care    Destination    [facility] SN[facility] .  Service: Skilled Chiropodist information: [place] [phone]

## 2020-02-22 ENCOUNTER — Ambulatory Visit (HOSPITAL_COMMUNITY): Payer: Medicare HMO

## 2020-02-22 ENCOUNTER — Other Ambulatory Visit: Payer: Medicare HMO

## 2020-02-23 NOTE — Telephone Encounter (Signed)
[name] from [facility] is starting back with the pt for skilled nursing 1w1 2w5 and OT plus PT will be added. She will be wrapping his legs for the edema. This is a courtesy call to inform PCP. She stated they will be faxing the orders for him to sign  [name] [name] can be reached at [phone]

## 2020-02-24 ENCOUNTER — Ambulatory Visit: Payer: Medicare HMO | Admitting: Hematology

## 2020-02-27 ENCOUNTER — Encounter: Payer: Self-pay | Admitting: *Deleted

## 2020-02-27 ENCOUNTER — Other Ambulatory Visit: Payer: Self-pay | Admitting: *Deleted

## 2020-02-27 NOTE — Patient Outreach (Signed)
[place] [facility]) Care Management  02/27/2020  SALLY CARLYON Sep 12, 1925 [id number]   Telephone assessment  Follow up after discharge from [facility]    Referral Date :01/25/20 Referral source:Reviewed patient ping for discharge from [facility] SN[facility] , 3/29.  Referral reason: Assess for Discharge needs  Insurance:[employer]   Subjective:  Successful outreach call to patient daughter [name], Designated party release, and HCPOA. Explained reason for the call, and being active with patient in care management after previous discharge from SNF.She recalls speaking with me in last month.   Conditions  Patient daughter  discussed that patient has been doing well since discharge  She reports that his appetite has improved.He continues on vancomycin to treat C.Diff condition.  She reports patient does have some swelling in his lower legs and home health RN visits and has wraps to leg due to swelling.  She reports that they do not add salt to patient foods. She is unsure if patient keeps track of his weights at home. Daughter agreeable to me following up with patient today also.   Social Patient lives at home alone,his daughter Jettie Booze states she and patient sister provides meals for patient. They have hired neighbor that checks on patient each evening,Sunday through Friday,  warms meals and Tuesday/Wednesday someone checks on him during the day. Patient daughter/son in law provides transportation for patient medical appointments.  Patient is followed by [facility] has had initial home visit.   Medications.  Patient was recently discharged from hospital and all medications have been reviewed.Patient daughter organizes medications.   Appointments Reinforced with patient daughter patient need for PCP follow visit after discharge, she is agreeable to scheduling .   1645 Successful outreach call to patient , reviewed with patient prior telephone outreach with him  prior recent readmission to hospital and then to SNF. Patient states that he is feeling much better, his appetite is hearty. He denies having diarrhea, he discussed still being on treatment for the diarrhea condition.  He voiced being happy to visit his wife during his stay at rehab.  He reports having a home health nurse visit on today and he recalls weight being 170's. He discussed having some swelling in the legs and the home health nurse if working wrapping his legs to help with swelling. Discussed with patient monitoring weights at home helps identify sudden gain , reviewed worsening symptoms of heart failure sudden weight gain of 3 pounds in a day, 5 in a week, shortness of breath, increased swelling, and reinforced to notify MD of sooner for follow up. Patient voiced that he went to the hospital this time for diarrhea not heart failure , but then states "I know I need to keep a eye on it".   Patient is agreeable to [facility] care management follow up after discharge for transition of care and management of chronic medical conditions. Patient agreeable to call in the next week.   Plan Will plan return call in the next week, for continued assessment and goal planning.  Will send PCP involvement/barrier letter  Will send patient [facility] care management welcome  letter.     Joylene Draft, RN, BSN  [place] Management Coordinator  [phone]- Mobile [phone]- Toll Free Main Office

## 2020-02-28 ENCOUNTER — Telehealth: Payer: Self-pay | Admitting: Adult Health

## 2020-02-28 NOTE — Telephone Encounter (Signed)
Ok for verbal orders ?

## 2020-02-28 NOTE — Telephone Encounter (Signed)
Will Schalla from [facility] is requesting verbal orders for occupational therapy 2 times a week for 2 weeks and 1 time a week for two weeks.  Phone: [phone]

## 2020-02-29 ENCOUNTER — Encounter: Payer: Self-pay | Admitting: Podiatry

## 2020-02-29 ENCOUNTER — Ambulatory Visit (INDEPENDENT_AMBULATORY_CARE_PROVIDER_SITE_OTHER): Payer: Medicare PPO | Admitting: Podiatry

## 2020-02-29 ENCOUNTER — Ambulatory Visit: Payer: Medicare HMO | Admitting: Hematology

## 2020-02-29 ENCOUNTER — Other Ambulatory Visit: Payer: Self-pay

## 2020-02-29 VITALS — Temp 98.0°F

## 2020-02-29 DIAGNOSIS — D689 Coagulation defect, unspecified: Secondary | ICD-10-CM | POA: Diagnosis not present

## 2020-02-29 DIAGNOSIS — R609 Edema, unspecified: Secondary | ICD-10-CM

## 2020-02-29 DIAGNOSIS — B351 Tinea unguium: Secondary | ICD-10-CM | POA: Diagnosis not present

## 2020-02-29 DIAGNOSIS — M79676 Pain in unspecified toe(s): Secondary | ICD-10-CM | POA: Diagnosis not present

## 2020-02-29 IMAGING — CT CT HEAD W/O CM
3 series · 15 of 47 positions shown, 18 images · non-contrast
Comparison: PET CT 09/05/2019, CT brain 08/28/2019

CLINICAL DATA: Weakness edema

EXAM:
CT HEAD WITHOUT CONTRAST
CT CERVICAL SPINE WITHOUT CONTRAST
TECHNIQUE: Multidetector CT imaging of the head and cervical spine was
performed following the standard protocol without intravenous
contrast. Multiplanar CT image reconstructions of the cervical spine
were also generated.

[Series 3: head wo · axial · 0.41mm/px · z∈[+1376,+1511]mm · 9 of 33 slices shown, 12 images]
[im 3/33  brain]
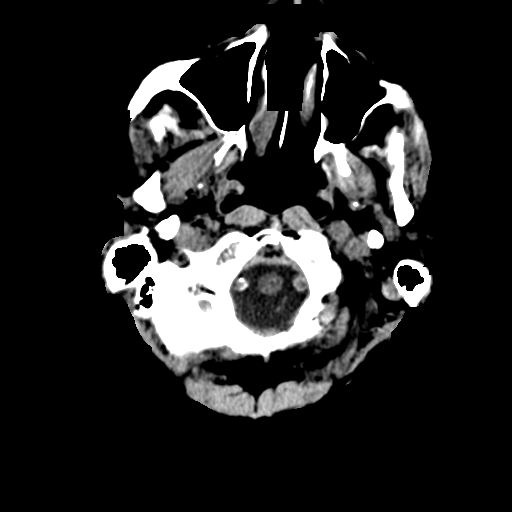
[im 3/33  bone]
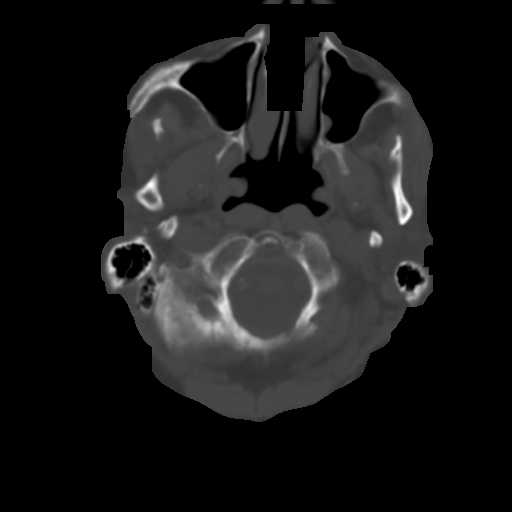
[im 6/33  brain]
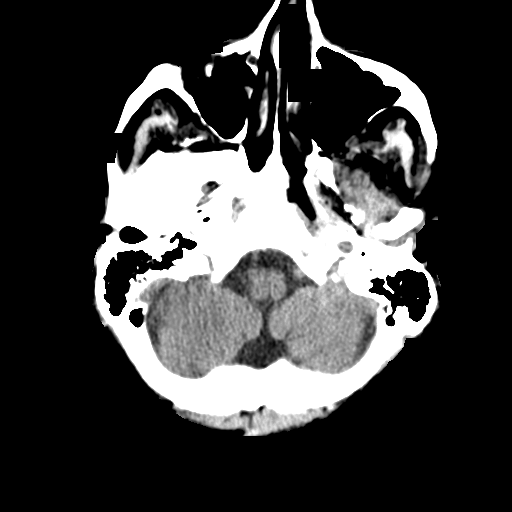
[im 9/33  brain]
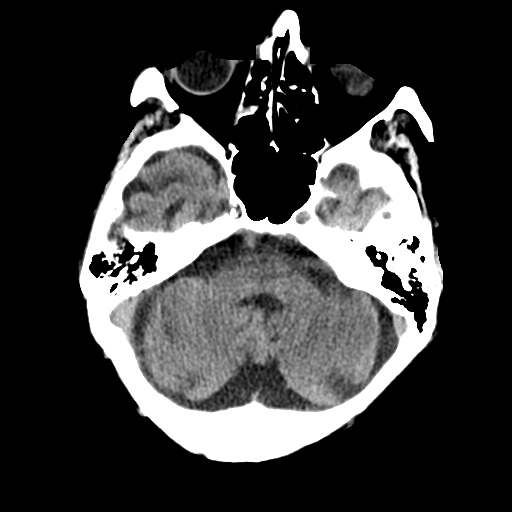
[im 13/33  brain]
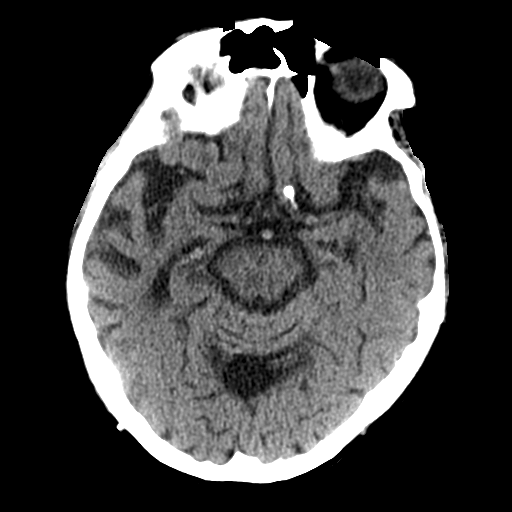
[im 17/33  brain]
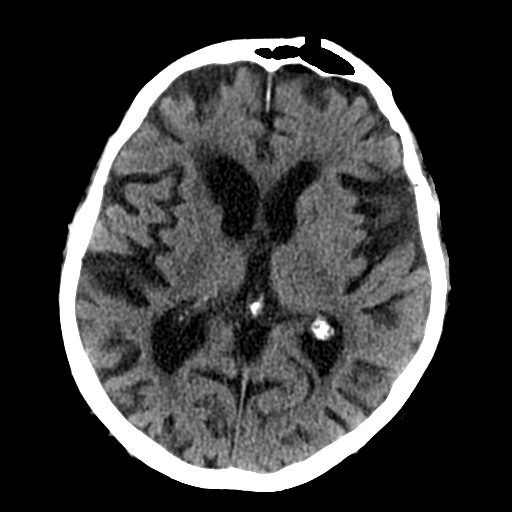
[im 17/33  bone]
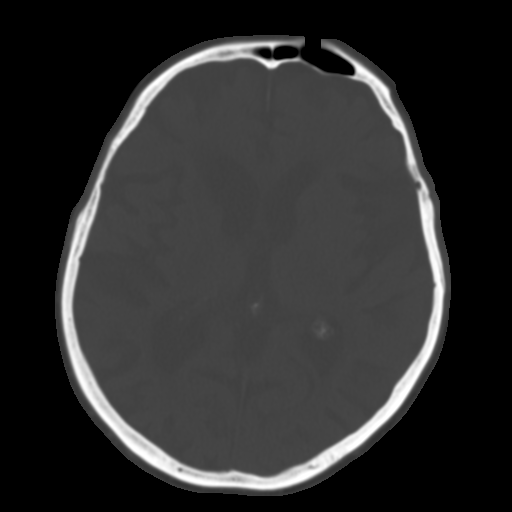
[im 20/33  brain]
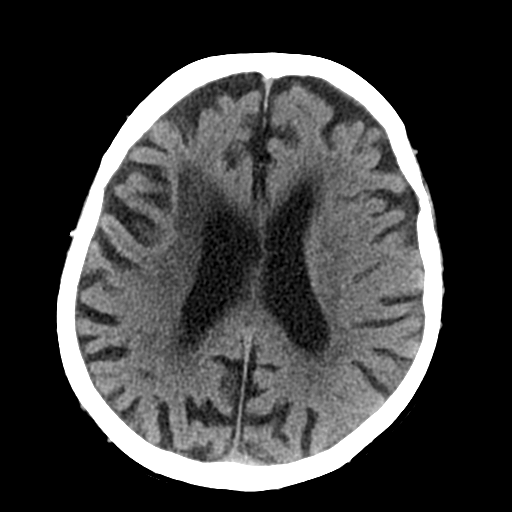
[im 24/33  brain]
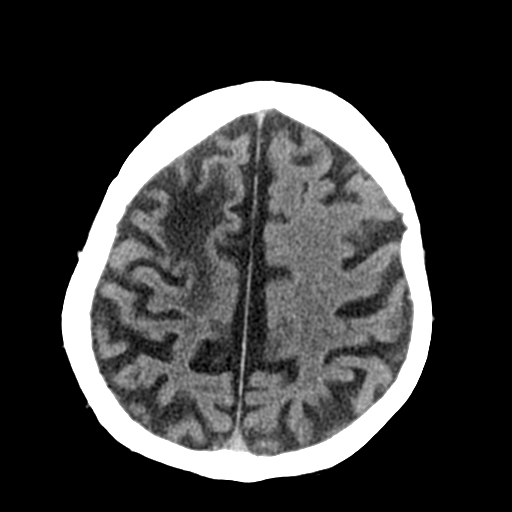
[im 27/33  brain]
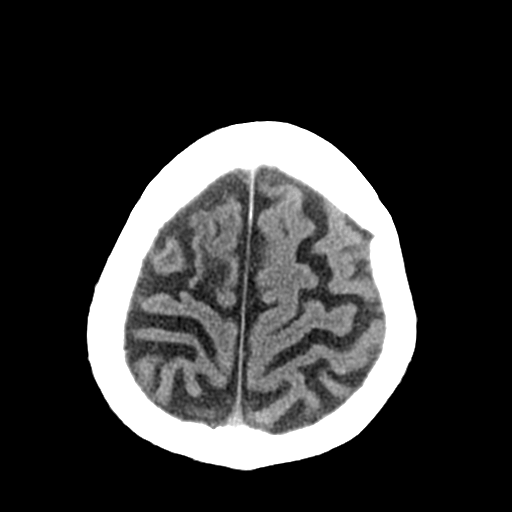
[im 30/33  brain]
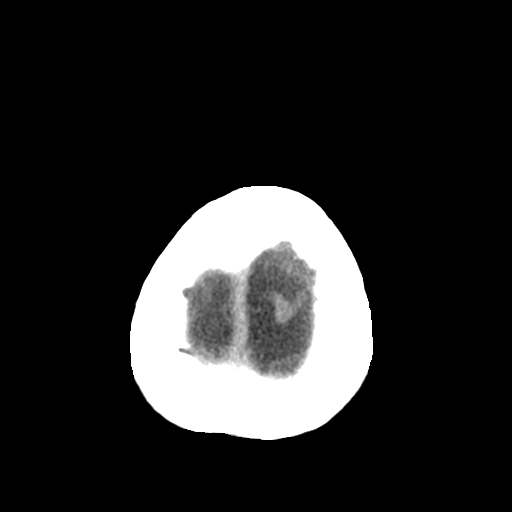
[im 30/33  bone]
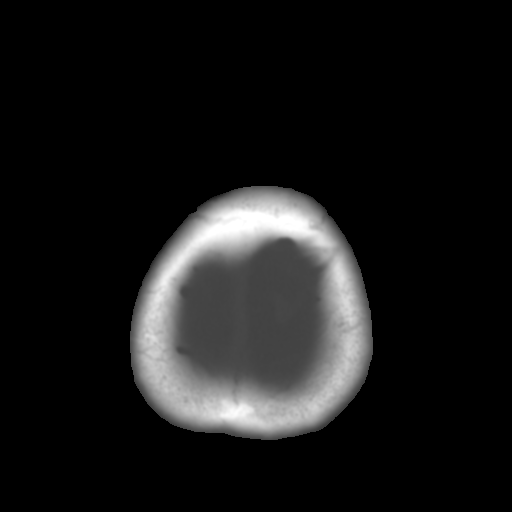

[Series 5: coronal soft tissue · coronal · 0.32mm/px · 3 of 66 slices shown]
[im 25/66  brain]
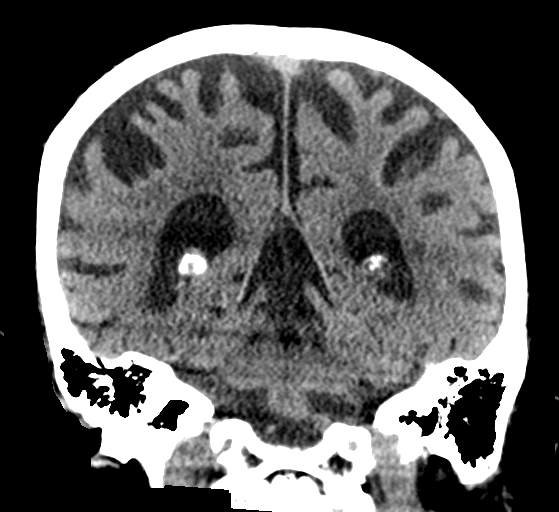
[im 30/66  brain]
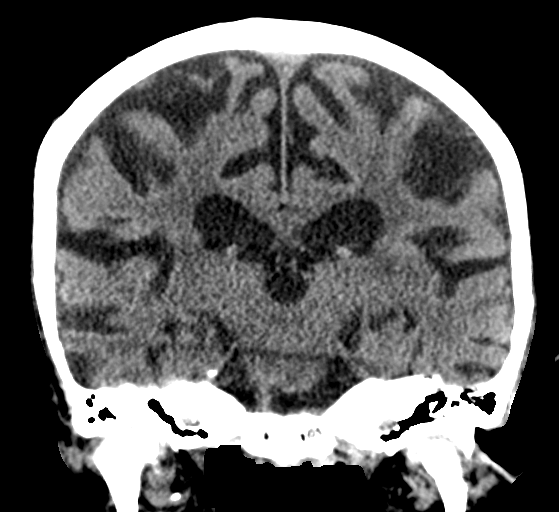
[im 36/66  brain]
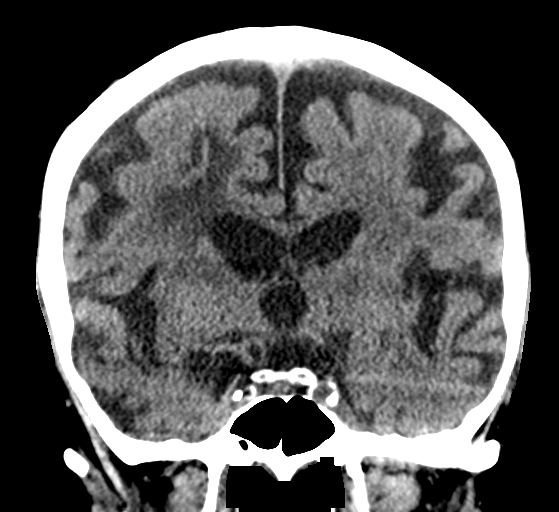

[Series 6: sagittal soft tissue · sagittal · 0.32mm/px · 3 of 60 slices shown]
[im 20/60  brain]
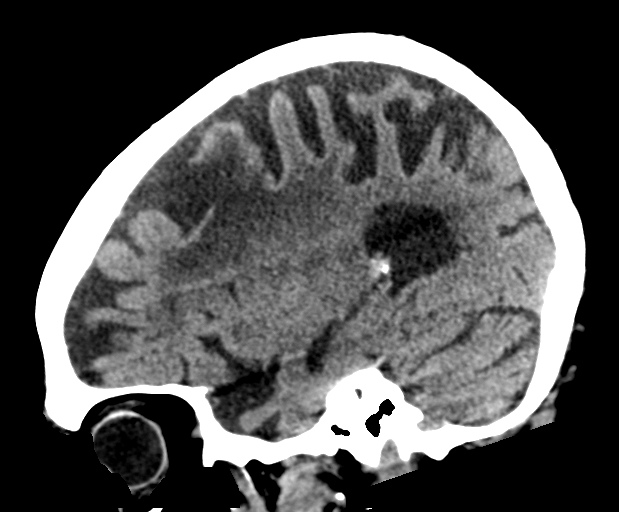
[im 30/60  brain]
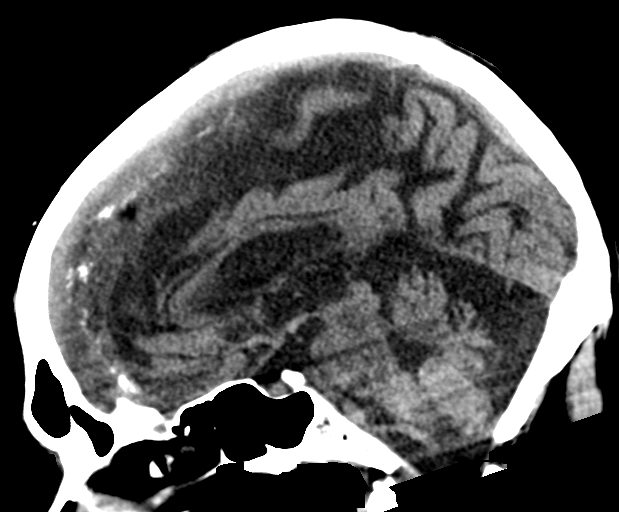
[im 40/60  brain]
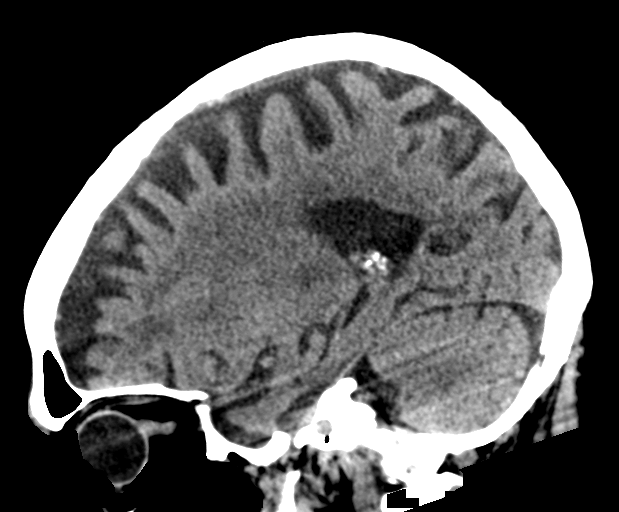

[15 of 47 positions shown; findings below may reference images not displayed]

FINDINGS: CT HEAD FINDINGS

Brain: No acute territorial infarction, hemorrhage or intracranial
mass. Advanced atrophy. Encephalomalacia in the right frontal lobe.
Moderate hypodensity in the white matter consistent with chronic
small vessel ischemic change.

Vascular: No hyperdense vessels.  Carotid vascular calcification

Skull: Normal. Negative for fracture or focal lesion.

Sinuses/Orbits: Mild mucosal thickening in the maxillary and ethmoid
sinuses

Other: None

CT CERVICAL SPINE FINDINGS

Alignment: No subluxation.  Facet alignment within normal limits.

Skull base and vertebrae: No acute fracture. No primary bone lesion
or focal pathologic process.

Soft tissues and spinal canal: No prevertebral fluid or swelling. No
visible canal hematoma.

Disc levels: Advanced degenerative change at the C1-C2 articulation.
Fusion of the right facet at C3 C4 and at the left facet at C2-C3
and C3-C4. Partial fusion of the posterior vertebral bodies at
C3-C4. Moderate severe degenerative change at multiple levels, most
notable at C5-C6 and C6-C7. Multiple level foraminal stenosis
bilaterally.

Upper chest: Emphysema at the apices.

Other: None
IMPRESSION: 1. No CT evidence for acute intracranial abnormality. Atrophy and
chronic small vessel ischemic change of the white matter. Right
frontal encephalomalacia as before
2. Multiple level degenerative change of the cervical spine. No
acute osseous abnormality
3. Emphysema

## 2020-02-29 NOTE — Telephone Encounter (Signed)
Spoke to [name] [name] and advised him to proceed with orders.  Nothing further needed.

## 2020-02-29 NOTE — Progress Notes (Signed)
This patient returns to my office for at risk foot care.  This patient requires this care by a professional since this patient will be at risk due to having coagulation defect.  Patient is taking coumadin.  Patient presents to the office with unna boots on both feet and legs  This patient is unable to cut nails himself since the patient cannot reach his nails.These nails are painful walking and wearing shoes.  This patient presents for at risk foot care today.  General Appearance  Alert, conversant and in no acute stress.  Vascular deferred  Neurologic deferred  Nails Thick disfigured discolored nails with subungual debris  from hallux to fifth toes bilaterally. No evidence of bacterial infection or drainage bilaterally.  Orthopedic  No limitations of motion  feet .  No crepitus or effusions noted.  No bony pathology or digital deformities noted.  Skin  No signs of infections or ulcers noted.   Thin red swollen skin on dorsum of both feet.  Crust noted both feet.  No callus noted plantarly.  Onychomycosis  Pain in right toes  Pain in left toes  Consent was obtained for treatment procedures.   Mechanical debridement of nails 1-5  bilaterally performed with a nail nipper.  Filed with dremel without incident.    Return office visit   3 months                  Told patient to return for periodic foot care and evaluation due to potential at risk complications.   Gardiner Barefoot DPM

## 2020-03-01 ENCOUNTER — Telehealth: Payer: Self-pay | Admitting: Adult Health

## 2020-03-01 NOTE — Telephone Encounter (Signed)
Samson nurse from [facility] wanted to provide pt's weight. -164lbs

## 2020-03-02 NOTE — Telephone Encounter (Signed)
Noted. I am not sure why they are sending these messages

## 2020-03-02 NOTE — Telephone Encounter (Signed)
Dominica Severin, a PT assitant from [facility], stated he is calling to report the pt's weight. 164lbs  Dominica Severin can be reached at [phone] -ok to leave a detailed message per Dominica Severin

## 2020-03-06 ENCOUNTER — Ambulatory Visit: Payer: Medicare HMO | Admitting: Cardiology

## 2020-03-07 ENCOUNTER — Other Ambulatory Visit: Payer: Self-pay | Admitting: *Deleted

## 2020-03-07 NOTE — Patient Outreach (Signed)
[facility]) Care Management  03/07/2020  [name] [name] [birth year] [id number]  Telephone follow up  Transition of care week 2   Referral Date :01/25/20 Referral source:Reviewed patient ping for discharge from [facility] SN[facility] , 3/29.  Dx: Diarrhea- Recurrent C.Difficile Colitis,  Acute Kidney Injury  Referral reason: Assess for transition of care  Discharge needs  Insurance:Hum[employer]   Subjective  Unsuccessful outreach call to patient daughter, [name] no answer able to leave a HIPAA compliant message for return call. Unsuccessful outreach call attempt to patient, no answer able to leave a HIPAA compliant message for return call.   Plan Will plan return call in the next 4 business days if no response on today.    Joylene Draft, RN, BSN  [place] Management Coordinator  [phone]- Mobile [phone]- Toll Free Main Office

## 2020-03-08 ENCOUNTER — Telehealth: Payer: Self-pay | Admitting: Adult Health

## 2020-03-08 NOTE — Telephone Encounter (Signed)
Erica from [facility] is needing verbal orders to send a Education officer, museum out to inform the pt of resources that will help him out around the house.   Danae Chen can be reached at [phone] -ok to leave detailed message

## 2020-03-09 NOTE — Telephone Encounter (Signed)
Erica notified to proceed with orders.

## 2020-03-12 ENCOUNTER — Other Ambulatory Visit: Payer: Self-pay | Admitting: *Deleted

## 2020-03-12 NOTE — Patient Outreach (Signed)
[facility]) Care Management  [place]  03/12/2020   [name] [name] [birth year] [id number]   Telephone Assessment /Transition of care   Referral Date :01/25/20 Referral source:Reviewed patient ping for discharge from [facility] SNF, 3/29. Dx: Diarrhea- Recurrent C.Difficile Colitis,  Acute Kidney Injury  Referral reason:Assess for transition of care  Discharge needs Insurance:Humana   Subjective:  Successful outreach call to patient daughter, [name] [name], [place], HIPAA identify confirmed x 2.  She reports patient doing pretty good, states that he reports having some episode of diarrhea on last week  but she doesn't believe it is an everyday thing . She discussed receiving many outreach calls on today on patient behalf, she explained currently working and limited time to speak with her on today. Discussed I will attempt to reach out to patient for follow up today and ongoing  also explained unsuccessful attempt to speak with patient on last week. Discussed with [name] if patient had  follow up visit with PCP since discharge , not scheduled yet but states she will make arrangement.  Successful outreach call to patient, he expressed being glad about being at home. He discussed having episode of diarrhea on a few days ago, but not in the last 3 days. He reports having soft stools daily. He states that this is the last week of medication treatment for C.Diff infection.      Encounter Medications:  Outpatient Encounter Medications as of 03/12/2020  Medication Sig Note  . acetaminophen (TYLENOL) 325 MG tablet Take 2 tablets (650 mg total) by mouth every 6 (six) hours as needed for mild pain (or Fever >/= 101).   Marland Kitchen atorvastatin (LIPITOR) 10 MG tablet TAKE 1 TABLET AT BEDTIME   . Calcium-Vitamin D (CALTRATE 600 PLUS-VIT D PO) Take 1 tablet by mouth at bedtime.    . ciprofloxacin (CILOXAN) 0.3 % ophthalmic solution Place 1 drop into the right eye 4  (four) times daily.   . folic acid (FOLVITE) 1 MG tablet Take 1 mg by mouth daily.    Marland Kitchen guaiFENesin (MUCINEX) 600 MG 12 hr tablet Take 600-1,200 mg by mouth 2 (two) times daily as needed (cold/congestion.).   Marland Kitchen methotrexate (RHEUMATREX) 2.5 MG tablet Take 10 mg by mouth once a week. Caution:Chemotherapy. Protect from light. On Saturdays   . metolazone (ZAROXOLYN) 2.5 MG tablet    . metoprolol succinate (TOPROL-XL) 25 MG 24 hr tablet TAKE ONE-HALF TABLET (12.5 MG TOTAL) DAILY WITH OR IMMEDIATELY FOLLOWING A MEAL (DOSE CHANGE)   . Multiple Vitamin (MULTIVITAMIN WITH MINERALS) TABS tablet Take 1 tablet by mouth daily.   . nitroGLYCERIN (NITROSTAT) 0.4 MG SL tablet DISSOLVE 1 TABLET UNDER THE TONGUE EVERY 5 MINUTES AS NEEDED FOR CHEST PAIN (Patient taking differently: Place 0.4 mg under the tongue every 5 (five) minutes as needed for chest pain. ) 02/27/2020: Taking as needed   . potassium chloride SA (KLOR-CON) 20 MEQ tablet Take 2 tablets (40 mEq total) by mouth every morning. (Patient taking differently: Take 20 mEq by mouth every morning. )   . tamsulosin (FLOMAX) 0.4 MG CAPS capsule Take 1 capsule (0.4 mg total) by mouth every evening.   . tobramycin (TOBREX) 0.3 % ophthalmic solution Place 1 drop into the right eye every 2 (two) hours.   . vancomycin (VANCOCIN) 50 mg/mL oral solution Take 2.5 mLs (125 mg total) by mouth 2 (two) times daily.   . vancomycin (VANCOCIN) 50 mg/mL oral solution Take 2.5 mLs (125 mg total)  by mouth daily.   . vancomycin (VANCOCIN) 50 mg/mL oral solution Take 2.5 mLs (125 mg total) by mouth every other day.   . vancomycin (VANCOCIN) 50 mg/mL oral solution Take 2.5 mLs (125 mg total) by mouth every 3 (three) days.   Marland Kitchen warfarin (COUMADIN) 5 MG tablet Take 1 tablet daily except take 1 1/2 tablets Mon Wed and Fridays or Take as directed by anticoagulation clinic (Patient taking differently: Take 5-7.5 mg by mouth See admin instructions. Take 1 tablet daily except take 1 1/2  tablets Mon Wed and Fridays or Take as directed by anticoagulation clinic)    No facility-administered encounter medications on file as of 03/12/2020.    Functional Status:  In your present state of health, do you have any difficulty performing the following activities: 02/27/2020 01/03/2020  Hearing? Y N  Comment hard of hearing -  Vision? N N  Comment - -  Difficulty concentrating or making decisions? Y N  Walking or climbing stairs? Y Y  Comment uses walker -  Dressing or bathing? N Y  Comment - -  Doing errands, shopping? Tempie Donning  Comment family assist -  Preparing Food and eating ? Y -  Comment family and arranged help assist patient -  Using the Toilet? N -  In the past six months, have you accidently leaked urine? N -  Comment - -  Do you have problems with loss of bowel control? N -  Comment only when having diarrhea -  Managing your Medications? Y -  Comment family assist -  Managing your Finances? Y -  Comment family assist -  Housekeeping or managing your Housekeeping? Y -  Comment famiy and hired help assist -  Some recent data might be hidden    Fall/Depression Screening: Fall Risk  02/27/2020 12/07/2019 10/19/2019  Falls in the past year? 1 1 1   Comment - - -  Number falls in past yr: 0 1 0  Comment - Fall January 2021 -  Injury with Fall? 0 0 0  Risk for fall due to : History of fall(s);Impaired balance/gait History of fall(s);Medication side effect;Impaired balance/gait;Impaired mobility;Impaired vision History of fall(s);Medication side effect;Impaired balance/gait;Impaired mobility;Impaired vision  Follow up Falls prevention discussed Falls evaluation completed;Education provided;Falls prevention discussed Falls evaluation completed;Education provided;Falls prevention discussed   PHQ 2/9 Scores 02/27/2020 02/08/2019 10/19/2018 12/18/2017 05/21/2017 05/18/2015  PHQ - 2 Score 0 0 0 0 0 0     SDOH Screenings   Alcohol Screen:   . Last Alcohol Screening Score (AUDIT):    Depression (PHQ2-9): Low Risk   . PHQ-2 Score: 0  Financial Resource Strain: Low Risk   . Difficulty of Paying Living Expenses: Not hard at all  Food Insecurity: No Food Insecurity  . Worried About Charity fundraiser in the Last Year: Never true  . Ran Out of Food in the Last Year: Never true  Housing:   . Last Housing Risk Score:   Physical Activity:   . Days of Exercise per Week:   . Minutes of Exercise per Session:   Social Connections:   . Frequency of Communication with Friends and Family:   . Frequency of Social Gatherings with Friends and Family:   . Attends Religious Services:   . Active Member of Clubs or Organizations:   . Attends Archivist Meetings:   Marland Kitchen Marital Status:   Stress:   . Feeling of Stress :   Tobacco Use: Medium Risk  . Smoking Tobacco  Use: Former Smoker  . Smokeless Tobacco Use: Never Used  Transportation Needs: No Transportation Needs  . Lack of Transportation (Medical): No  . Lack of Transportation (Non-Medical): No    Assessment:   C.Difficile Coliits Improvement in diarrhea per report, having daily soft stools, he is on last week of vancomycin treatment .  Good appetite, including boost in daily meal plan    Heart failure  Followed by [facility] home health since discharge, patient continues with compression leg wraps per his report. He is working on Horticulturist, commercial of his daily weight and reports writing it down, has not weighed on today, recent weight on yesterday, he is unable to recall exact, but states in the 160'. He denies sudden weight, increased swelling, shortness of breath as worsening heart failure symptoms.   High fall risk  No recent falls, continues to use walker for support. He is active with home health therapy and plans of visit on today.  He also reports Social worker visit on today, left him some information on today,he states unsure of material left.  He continues to have support/Neighbor to check on him daily to  back sure if has evening meal warmed up. He lives at home alone, his goal is to remain in the home.  Family support with medication organization/meal preparation and transportation. May benefit from additional support in home, noted [facility] social worker visit on today per patient report.   Appointments Needs post discharge visit with PCP scheduled, patient daughter agreeable to scheduling.   Plan Will plan follow up call in the next week, will collaborate with [facility] home health regarding progress and care needs.  Will send PCP this initial visit note.    [facility]N CM Care Plan Problem One     Most Recent Value  Care Plan Problem One  At risk for readmission due to recent hospital discharge and SNF stay  related to sepsis, Acute kidney injury   Role Documenting the Problem One  Care Management Telephonic Coordinator  Care Plan for Problem One  Active  [facility] Long Term Goal   Patient will not experience hosptial admission over the next 30 days   [facility]N Long Term Goal Start Date  02/27/20  Interventions for Problem One Long Term Goal  Discussed current clinical state, reinforced notifying MD if diarrhea reoccurs , emphasized importance of nutrition intake, fluid . Reviewed vancomycin plan.  Will plan collaboration with home health prior to next telephone outreach   [facility] CM Short Term Goal #1   Patient will attend post discharge medical appointment over the next 30 days   [facility] CM Short Term Goal #1 Start Date  02/27/20  Interventions for Short Term Goal #1  Reviewed with patient daughter importance of scheduling post discharge visit with PCP for review of medical plan, medications. Verifed family ability to provide transportation.   [facility] CM Short Term Goal #2   Over the next 30 days patient will be able to report increase in strength as evidenced by patient report of no falls.    [facility] CM Short Term Goal #2 Start Date  02/27/20  Interventions for Short Term Goal #2  Verified active with home health services  for therapy, reinforced particpating in exerices. Reinforced using walker at all times.     [facility]N CM Care Plan Problem Two     Most Recent Value  Care Plan Problem Two  Knowledge Deficit related to self care management of Heart failure   Role Documenting the Problem Two  Care Management Coordinator  Care Plan for Problem Two  Active  Interventions for Problem Two Long Term Goal   Discussed with patient current clinical state, reviewed worsening symptoms of heart failure, sudden weight gain of 3 pounds in a day , 5 in a week, increase in swelling , shortness of breath , reviewed options to notify regarding new symptoms, notify his daughter so they can notify Medical team, home health . Reinforced keeping track of daily weights and keeping a record.   [facility]N Long Term Goal  Over the next 60 days patient will be able to identify 2 self care management skills related HF self care management .   [place] Long Term Goal Start Date  03/12/20      Joylene Draft, RN, BSN  [place] Management Coordinator  [phone]- Mobile [phone]- Toll Free Main Office

## 2020-03-20 MED ORDER — ACETAMINOPHEN 325 MG PO TABS
650.00 | ORAL_TABLET | ORAL | Status: DC
Start: 2020-03-20 — End: 2020-03-20

## 2020-03-20 MED ORDER — LACTATED RINGERS IV SOLN
1000.00 | INTRAVENOUS | Status: DC
Start: 2020-03-20 — End: 2020-03-20

## 2020-03-21 ENCOUNTER — Other Ambulatory Visit: Payer: Self-pay | Admitting: *Deleted

## 2020-03-21 ENCOUNTER — Ambulatory Visit: Payer: Self-pay | Admitting: *Deleted

## 2020-03-21 MED ORDER — METHOTREXATE 2.5 MG PO TABS
10.00 | ORAL_TABLET | ORAL | Status: DC
Start: 2020-03-31 — End: 2020-03-21

## 2020-03-21 MED ORDER — SODIUM CHLORIDE 0.9 % IV SOLN
INTRAVENOUS | Status: DC
Start: ? — End: 2020-03-21

## 2020-03-21 MED ORDER — CALCIUM CARBONATE 1250 (500 CA) MG PO TABS
600.00 | ORAL_TABLET | ORAL | Status: DC
Start: 2020-03-27 — End: 2020-03-21

## 2020-03-21 MED ORDER — SODIUM CHLORIDE FLUSH 0.9 % IV SOLN
5.00 | INTRAVENOUS | Status: DC
Start: 2020-03-26 — End: 2020-03-21

## 2020-03-21 MED ORDER — DEXTROMETHORPHAN-GUAIFENESIN 10-100 MG/5ML PO LIQD
5.00 | ORAL | Status: DC
Start: ? — End: 2020-03-21

## 2020-03-21 MED ORDER — VANCOMYCIN HCL 50 MG/ML PO SOLR
125.00 | ORAL | Status: DC
Start: 2020-03-26 — End: 2020-03-21

## 2020-03-21 MED ORDER — POTASSIUM CHLORIDE CRYS ER 20 MEQ PO TBCR
20.00 | EXTENDED_RELEASE_TABLET | ORAL | Status: DC
Start: 2020-03-27 — End: 2020-03-21

## 2020-03-21 MED ORDER — ACETAMINOPHEN 325 MG PO TABS
650.00 | ORAL_TABLET | ORAL | Status: DC
Start: ? — End: 2020-03-21

## 2020-03-21 MED ORDER — SODIUM CHLORIDE FLUSH 0.9 % IV SOLN
5.00 | INTRAVENOUS | Status: DC
Start: ? — End: 2020-03-21

## 2020-03-21 MED ORDER — TAMSULOSIN HCL 0.4 MG PO CAPS
0.40 | ORAL_CAPSULE | ORAL | Status: DC
Start: 2020-03-27 — End: 2020-03-21

## 2020-03-21 MED ORDER — MIDODRINE HCL 5 MG PO TABS
5.00 | ORAL_TABLET | ORAL | Status: DC
Start: 2020-03-26 — End: 2020-03-21

## 2020-03-21 MED ORDER — QUINTABS PO TABS
1.00 | ORAL_TABLET | ORAL | Status: DC
Start: 2020-03-27 — End: 2020-03-21

## 2020-03-21 MED ORDER — POTASSIUM CHLORIDE CRYS ER 20 MEQ PO TBCR
40.00 | EXTENDED_RELEASE_TABLET | ORAL | Status: DC
Start: 2020-03-21 — End: 2020-03-21

## 2020-03-21 MED ORDER — ATORVASTATIN CALCIUM 10 MG PO TABS
10.00 | ORAL_TABLET | ORAL | Status: DC
Start: 2020-03-26 — End: 2020-03-21

## 2020-03-21 MED ORDER — NITROGLYCERIN 0.4 MG SL SUBL
0.40 | SUBLINGUAL_TABLET | SUBLINGUAL | Status: DC
Start: ? — End: 2020-03-21

## 2020-03-21 MED ORDER — ONDANSETRON HCL 4 MG/2ML IJ SOLN
4.00 | INTRAMUSCULAR | Status: DC
Start: ? — End: 2020-03-21

## 2020-03-21 MED ORDER — RA PROBIOTIC DIGESTIVE CARE PO CAPS
1.00 | ORAL_CAPSULE | ORAL | Status: DC
Start: 2020-03-23 — End: 2020-03-21

## 2020-03-21 MED ORDER — FOLIC ACID 1 MG PO TABS
1.00 | ORAL_TABLET | ORAL | Status: DC
Start: 2020-03-27 — End: 2020-03-21

## 2020-03-21 NOTE — Patient Outreach (Signed)
[place] [facility]) Care Management  03/21/2020  MACALISTER SIRMAN 1925-06-22 [id number]   Care Coordination  Hospital readmission    Referral Date :01/25/20 Referral source:Reviewed patient ping for discharge from [facility] SNF, 3/29. Dx: Diarrhea- Recurrent C.Difficile Colitis,  Acute Kidney Injury  Referral reason:Assess for transition of care  Discharge needs Insurance:Humana   Upon patient chart review for scheduled telephone visit on today, noted patient with inpatient admission to [facility] on 03/20/20.  Follow up collaboration call to [facility] home health nurse Danae Chen following patient , she discussed patient was still requiring compression wraps to lower legs for edema. She reports he was still having episodes of diarrhea. She voiced concern that patient would benefit from additional personal care assistance during the day. She discussed [facility] social worker has seen patient for resource information and unsure if family able to review yet.   Plan Will follow patient for  disposition plans and follow up . Will consider [facility] CM . LCSW referral for additional care needs after discharge and discussion with patient daughter Jettie Booze. HCPOA.    Joylene Draft, RN, BSN  [place] Management Coordinator  [phone]- Mobile [phone]- Toll Free Main Office

## 2020-03-22 MED ORDER — WARFARIN SODIUM 5 MG PO TABS
5.00 | ORAL_TABLET | ORAL | Status: DC
Start: 2020-03-22 — End: 2020-03-22

## 2020-03-26 DIAGNOSIS — S129XXA Fracture of neck, unspecified, initial encounter: Secondary | ICD-10-CM | POA: Diagnosis not present

## 2020-03-26 DIAGNOSIS — I10 Essential (primary) hypertension: Secondary | ICD-10-CM | POA: Diagnosis not present

## 2020-03-26 DIAGNOSIS — R1312 Dysphagia, oropharyngeal phase: Secondary | ICD-10-CM | POA: Diagnosis not present

## 2020-03-26 DIAGNOSIS — D508 Other iron deficiency anemias: Secondary | ICD-10-CM | POA: Diagnosis not present

## 2020-03-26 DIAGNOSIS — E7849 Other hyperlipidemia: Secondary | ICD-10-CM | POA: Diagnosis not present

## 2020-03-26 DIAGNOSIS — A0472 Enterocolitis due to Clostridium difficile, not specified as recurrent: Secondary | ICD-10-CM | POA: Diagnosis not present

## 2020-03-26 DIAGNOSIS — Z7401 Bed confinement status: Secondary | ICD-10-CM | POA: Diagnosis not present

## 2020-03-26 DIAGNOSIS — I4891 Unspecified atrial fibrillation: Secondary | ICD-10-CM | POA: Diagnosis not present

## 2020-03-26 DIAGNOSIS — L309 Dermatitis, unspecified: Secondary | ICD-10-CM | POA: Diagnosis not present

## 2020-03-26 DIAGNOSIS — A419 Sepsis, unspecified organism: Secondary | ICD-10-CM | POA: Diagnosis not present

## 2020-03-26 DIAGNOSIS — C439 Malignant melanoma of skin, unspecified: Secondary | ICD-10-CM | POA: Diagnosis not present

## 2020-03-26 DIAGNOSIS — E039 Hypothyroidism, unspecified: Secondary | ICD-10-CM | POA: Diagnosis not present

## 2020-03-26 DIAGNOSIS — I5022 Chronic systolic (congestive) heart failure: Secondary | ICD-10-CM | POA: Diagnosis not present

## 2020-03-26 DIAGNOSIS — J449 Chronic obstructive pulmonary disease, unspecified: Secondary | ICD-10-CM | POA: Diagnosis not present

## 2020-03-26 DIAGNOSIS — R278 Other lack of coordination: Secondary | ICD-10-CM | POA: Diagnosis not present

## 2020-03-26 DIAGNOSIS — W19XXXA Unspecified fall, initial encounter: Secondary | ICD-10-CM | POA: Diagnosis not present

## 2020-03-26 DIAGNOSIS — R7989 Other specified abnormal findings of blood chemistry: Secondary | ICD-10-CM | POA: Diagnosis not present

## 2020-03-26 DIAGNOSIS — M2550 Pain in unspecified joint: Secondary | ICD-10-CM | POA: Diagnosis not present

## 2020-03-26 DIAGNOSIS — M6281 Muscle weakness (generalized): Secondary | ICD-10-CM | POA: Diagnosis not present

## 2020-03-26 DIAGNOSIS — E785 Hyperlipidemia, unspecified: Secondary | ICD-10-CM | POA: Diagnosis not present

## 2020-03-26 DIAGNOSIS — I2510 Atherosclerotic heart disease of native coronary artery without angina pectoris: Secondary | ICD-10-CM | POA: Diagnosis not present

## 2020-03-26 DIAGNOSIS — A0471 Enterocolitis due to Clostridium difficile, recurrent: Secondary | ICD-10-CM | POA: Diagnosis not present

## 2020-03-26 DIAGNOSIS — N179 Acute kidney failure, unspecified: Secondary | ICD-10-CM | POA: Diagnosis not present

## 2020-03-26 DIAGNOSIS — R197 Diarrhea, unspecified: Secondary | ICD-10-CM | POA: Diagnosis not present

## 2020-03-26 MED ORDER — RA PROBIOTIC DIGESTIVE CARE PO CAPS
1.00 | ORAL_CAPSULE | ORAL | Status: DC
Start: 2020-03-26 — End: 2020-03-26

## 2020-03-26 MED ORDER — WARFARIN SODIUM 5 MG PO TABS
5.00 | ORAL_TABLET | ORAL | Status: DC
Start: 2020-03-26 — End: 2020-03-26

## 2020-03-27 DIAGNOSIS — C439 Malignant melanoma of skin, unspecified: Secondary | ICD-10-CM | POA: Diagnosis not present

## 2020-03-27 DIAGNOSIS — L309 Dermatitis, unspecified: Secondary | ICD-10-CM | POA: Diagnosis not present

## 2020-03-27 DIAGNOSIS — D508 Other iron deficiency anemias: Secondary | ICD-10-CM | POA: Diagnosis not present

## 2020-03-27 DIAGNOSIS — E7849 Other hyperlipidemia: Secondary | ICD-10-CM | POA: Diagnosis not present

## 2020-03-28 ENCOUNTER — Inpatient Hospital Stay: Payer: TRICARE For Life (TFL) | Admitting: Adult Health

## 2020-03-28 ENCOUNTER — Other Ambulatory Visit: Payer: Self-pay | Admitting: *Deleted

## 2020-03-28 ENCOUNTER — Ambulatory Visit: Payer: TRICARE For Life (TFL)

## 2020-03-28 DIAGNOSIS — R7989 Other specified abnormal findings of blood chemistry: Secondary | ICD-10-CM | POA: Diagnosis not present

## 2020-03-28 DIAGNOSIS — E7849 Other hyperlipidemia: Secondary | ICD-10-CM | POA: Diagnosis not present

## 2020-03-28 DIAGNOSIS — J449 Chronic obstructive pulmonary disease, unspecified: Secondary | ICD-10-CM | POA: Diagnosis not present

## 2020-03-28 DIAGNOSIS — I5022 Chronic systolic (congestive) heart failure: Secondary | ICD-10-CM | POA: Diagnosis not present

## 2020-03-28 NOTE — Patient Outreach (Signed)
[place] [facility]) Care Management  03/28/2020  JERIKO SOLTERO 31-Jan-1925 [id number]   Care Coordination [facility] readmission    Referral Date:01/25/20 Referral source:Reviewed patient ping for discharge from [facility], 3/29. Dx: Diarrhea- Recurrent C.Difficile Colitis, Acute Kidney Injury  Referral reason:Assess fortransition of careDischarge needs Insurance:Humana   Patient with inpatient admission to [facility] on 03/20/20 Dx: Fall, Diarrhea, Severe recurrent C. Difficile colitis.  3rd C.Difficile admission in 3 months.   Patient was discharged to [facility] rehab on 03/26/20.    Plan Will close [facility] care management program  , Will monitor  per Patient Pearletha [place] for disposition plans and follow up.    Joylene Draft, RN, BSN  [place] Management Coordinator  [phone]- Mobile [phone]- Toll Free Main Office

## 2020-03-29 DIAGNOSIS — A0472 Enterocolitis due to Clostridium difficile, not specified as recurrent: Secondary | ICD-10-CM | POA: Diagnosis not present

## 2020-03-29 DIAGNOSIS — S129XXA Fracture of neck, unspecified, initial encounter: Secondary | ICD-10-CM | POA: Diagnosis not present

## 2020-03-29 DIAGNOSIS — N179 Acute kidney failure, unspecified: Secondary | ICD-10-CM | POA: Diagnosis not present

## 2020-03-29 DIAGNOSIS — A419 Sepsis, unspecified organism: Secondary | ICD-10-CM | POA: Diagnosis not present

## 2020-03-30 ENCOUNTER — Other Ambulatory Visit: Payer: Self-pay | Admitting: *Deleted

## 2020-03-30 DIAGNOSIS — I10 Essential (primary) hypertension: Secondary | ICD-10-CM | POA: Diagnosis not present

## 2020-03-30 DIAGNOSIS — L309 Dermatitis, unspecified: Secondary | ICD-10-CM | POA: Diagnosis not present

## 2020-03-30 DIAGNOSIS — E039 Hypothyroidism, unspecified: Secondary | ICD-10-CM | POA: Diagnosis not present

## 2020-03-30 DIAGNOSIS — A0472 Enterocolitis due to Clostridium difficile, not specified as recurrent: Secondary | ICD-10-CM | POA: Diagnosis not present

## 2020-03-30 DIAGNOSIS — I5022 Chronic systolic (congestive) heart failure: Secondary | ICD-10-CM | POA: Diagnosis not present

## 2020-03-30 NOTE — Patient Outreach (Signed)
[place] [facility]) Care Management  03/30/2020  [name] [name] 04-07-1925 [id number]  This encounter was created in error - please disregard.   Joylene Draft, RN, BSN  [place] Management Coordinator  [phone]- Mobile [phone]- Toll Free Main Office

## 2020-04-06 DIAGNOSIS — A419 Sepsis, unspecified organism: Secondary | ICD-10-CM | POA: Diagnosis not present

## 2020-04-06 DIAGNOSIS — N179 Acute kidney failure, unspecified: Secondary | ICD-10-CM | POA: Diagnosis not present

## 2020-04-06 DIAGNOSIS — S129XXA Fracture of neck, unspecified, initial encounter: Secondary | ICD-10-CM | POA: Diagnosis not present

## 2020-04-06 DIAGNOSIS — A0472 Enterocolitis due to Clostridium difficile, not specified as recurrent: Secondary | ICD-10-CM | POA: Diagnosis not present

## 2020-04-11 DIAGNOSIS — I10 Essential (primary) hypertension: Secondary | ICD-10-CM | POA: Diagnosis not present

## 2020-04-11 DIAGNOSIS — I5022 Chronic systolic (congestive) heart failure: Secondary | ICD-10-CM | POA: Diagnosis not present

## 2020-04-11 DIAGNOSIS — E7849 Other hyperlipidemia: Secondary | ICD-10-CM | POA: Diagnosis not present

## 2020-04-11 DIAGNOSIS — D508 Other iron deficiency anemias: Secondary | ICD-10-CM | POA: Diagnosis not present

## 2020-04-13 DIAGNOSIS — E039 Hypothyroidism, unspecified: Secondary | ICD-10-CM | POA: Diagnosis not present

## 2020-04-13 DIAGNOSIS — I10 Essential (primary) hypertension: Secondary | ICD-10-CM | POA: Diagnosis not present

## 2020-04-13 DIAGNOSIS — D508 Other iron deficiency anemias: Secondary | ICD-10-CM | POA: Diagnosis not present

## 2020-04-13 DIAGNOSIS — E7849 Other hyperlipidemia: Secondary | ICD-10-CM | POA: Diagnosis not present

## 2020-04-14 DIAGNOSIS — M6281 Muscle weakness (generalized): Secondary | ICD-10-CM | POA: Diagnosis not present

## 2020-04-14 DIAGNOSIS — I6521 Occlusion and stenosis of right carotid artery: Secondary | ICD-10-CM | POA: Diagnosis not present

## 2020-04-14 DIAGNOSIS — M5030 Other cervical disc degeneration, unspecified cervical region: Secondary | ICD-10-CM | POA: Diagnosis not present

## 2020-04-14 DIAGNOSIS — I110 Hypertensive heart disease with heart failure: Secondary | ICD-10-CM | POA: Diagnosis not present

## 2020-04-14 DIAGNOSIS — A0472 Enterocolitis due to Clostridium difficile, not specified as recurrent: Secondary | ICD-10-CM | POA: Diagnosis not present

## 2020-04-14 DIAGNOSIS — M069 Rheumatoid arthritis, unspecified: Secondary | ICD-10-CM | POA: Diagnosis not present

## 2020-04-14 DIAGNOSIS — I482 Chronic atrial fibrillation, unspecified: Secondary | ICD-10-CM | POA: Diagnosis not present

## 2020-04-14 DIAGNOSIS — I5032 Chronic diastolic (congestive) heart failure: Secondary | ICD-10-CM | POA: Diagnosis not present

## 2020-04-14 DIAGNOSIS — I2510 Atherosclerotic heart disease of native coronary artery without angina pectoris: Secondary | ICD-10-CM | POA: Diagnosis not present

## 2020-04-17 DIAGNOSIS — M6281 Muscle weakness (generalized): Secondary | ICD-10-CM | POA: Diagnosis not present

## 2020-04-17 DIAGNOSIS — I2510 Atherosclerotic heart disease of native coronary artery without angina pectoris: Secondary | ICD-10-CM | POA: Diagnosis not present

## 2020-04-17 DIAGNOSIS — I6521 Occlusion and stenosis of right carotid artery: Secondary | ICD-10-CM | POA: Diagnosis not present

## 2020-04-17 DIAGNOSIS — I110 Hypertensive heart disease with heart failure: Secondary | ICD-10-CM | POA: Diagnosis not present

## 2020-04-17 DIAGNOSIS — M069 Rheumatoid arthritis, unspecified: Secondary | ICD-10-CM | POA: Diagnosis not present

## 2020-04-17 DIAGNOSIS — I482 Chronic atrial fibrillation, unspecified: Secondary | ICD-10-CM | POA: Diagnosis not present

## 2020-04-17 DIAGNOSIS — I5032 Chronic diastolic (congestive) heart failure: Secondary | ICD-10-CM | POA: Diagnosis not present

## 2020-04-17 DIAGNOSIS — M5030 Other cervical disc degeneration, unspecified cervical region: Secondary | ICD-10-CM | POA: Diagnosis not present

## 2020-04-17 DIAGNOSIS — A0472 Enterocolitis due to Clostridium difficile, not specified as recurrent: Secondary | ICD-10-CM | POA: Diagnosis not present

## 2020-04-18 ENCOUNTER — Ambulatory Visit (INDEPENDENT_AMBULATORY_CARE_PROVIDER_SITE_OTHER): Payer: Medicare Other | Admitting: General Practice

## 2020-04-18 ENCOUNTER — Telehealth: Payer: Self-pay | Admitting: Family Medicine

## 2020-04-18 DIAGNOSIS — M5030 Other cervical disc degeneration, unspecified cervical region: Secondary | ICD-10-CM | POA: Diagnosis not present

## 2020-04-18 DIAGNOSIS — Z7901 Long term (current) use of anticoagulants: Secondary | ICD-10-CM

## 2020-04-18 DIAGNOSIS — I5032 Chronic diastolic (congestive) heart failure: Secondary | ICD-10-CM | POA: Diagnosis not present

## 2020-04-18 DIAGNOSIS — I482 Chronic atrial fibrillation, unspecified: Secondary | ICD-10-CM | POA: Diagnosis not present

## 2020-04-18 DIAGNOSIS — I110 Hypertensive heart disease with heart failure: Secondary | ICD-10-CM | POA: Diagnosis not present

## 2020-04-18 DIAGNOSIS — I6521 Occlusion and stenosis of right carotid artery: Secondary | ICD-10-CM | POA: Diagnosis not present

## 2020-04-18 DIAGNOSIS — M069 Rheumatoid arthritis, unspecified: Secondary | ICD-10-CM | POA: Diagnosis not present

## 2020-04-18 DIAGNOSIS — M6281 Muscle weakness (generalized): Secondary | ICD-10-CM | POA: Diagnosis not present

## 2020-04-18 DIAGNOSIS — A0472 Enterocolitis due to Clostridium difficile, not specified as recurrent: Secondary | ICD-10-CM | POA: Diagnosis not present

## 2020-04-18 DIAGNOSIS — I2510 Atherosclerotic heart disease of native coronary artery without angina pectoris: Secondary | ICD-10-CM | POA: Diagnosis not present

## 2020-04-18 LAB — POCT INR: INR: 2.4 (ref 2.0–3.0)

## 2020-04-18 NOTE — Patient Instructions (Signed)
Pre visit review using our clinic review tool, if applicable. No additional management support is needed unless otherwise documented below in the visit note.  Continue to take 1 tablet daily except 1/2 tablet on Monday and Fridays. INR reported by  RN @ [facility].   Re-check in 2 weeks.   Daughter, [name] has been advised of dosing change [phone].

## 2020-04-18 NOTE — Telephone Encounter (Signed)
Lattie Haw with [facility] call to report PT/INR.  Will forward to Jenny Reichmann so she may contact Lattie Haw with instructions for Coumadin.  PT - 26.8 INR - 2.4

## 2020-04-18 NOTE — Progress Notes (Signed)
I have reviewed the results and agree with this plan   

## 2020-04-19 ENCOUNTER — Other Ambulatory Visit: Payer: Self-pay | Admitting: *Deleted

## 2020-04-19 DIAGNOSIS — I2510 Atherosclerotic heart disease of native coronary artery without angina pectoris: Secondary | ICD-10-CM | POA: Diagnosis not present

## 2020-04-19 DIAGNOSIS — M6281 Muscle weakness (generalized): Secondary | ICD-10-CM | POA: Diagnosis not present

## 2020-04-19 DIAGNOSIS — I6521 Occlusion and stenosis of right carotid artery: Secondary | ICD-10-CM | POA: Diagnosis not present

## 2020-04-19 DIAGNOSIS — I482 Chronic atrial fibrillation, unspecified: Secondary | ICD-10-CM | POA: Diagnosis not present

## 2020-04-19 DIAGNOSIS — M069 Rheumatoid arthritis, unspecified: Secondary | ICD-10-CM | POA: Diagnosis not present

## 2020-04-19 DIAGNOSIS — I110 Hypertensive heart disease with heart failure: Secondary | ICD-10-CM | POA: Diagnosis not present

## 2020-04-19 DIAGNOSIS — M5030 Other cervical disc degeneration, unspecified cervical region: Secondary | ICD-10-CM | POA: Diagnosis not present

## 2020-04-19 DIAGNOSIS — A0472 Enterocolitis due to Clostridium difficile, not specified as recurrent: Secondary | ICD-10-CM | POA: Diagnosis not present

## 2020-04-19 DIAGNOSIS — I5032 Chronic diastolic (congestive) heart failure: Secondary | ICD-10-CM | POA: Diagnosis not present

## 2020-04-19 NOTE — Patient Outreach (Signed)
[place] [facility]) Care Management  04/20/2020  [name] [name] 09-29-1925 [id number]  Transition of care  Telephone Assessment    Referral Source: Reviewed ping for patient discharge from [facility] SNF noted discharge date 04/13/20 DX: C.Diff infection, fall  Insurance: Humana, Tricare   Patient is 86 year old male with PMHX;  Heart Failure, EF 20-25%. 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  Hospital admissions : 2/5-  01/03/20- [place] - Diarrhea, C.Diff,  sepsis  3/8-3/12/21- [place] - Diarrhea, C.Diff , AKI, [facility]  4/27- 5/3/21[facility] -  Diarrhea, C.Diff, Fall     Subjective: Successful outreach call to patient daughter Jettie Booze, [place], HIPAA confirmed. Reintroduced self and [facility] care management from prior engagement before recent hospital admission.  Daughter reports patient doing fairly well, she reports patient still has bouts of diarrhea. She discussed patient still being on vancomycin at discharge from [facility] report plan for medication to continue on tapering plan until completed 8 week plan . She reports having to notify MD for additional prescription as supply sent home from facility was not enough to last until PCP visit . She reports having to get capsules from the pharmacy instead of liquid that patient was taking. She states her sister should pick up prescription on today.  Daughter agreeable that I may contact patient .   Social  Patient lives at home alone, he does have hired neighbor that check on patient each evening to assist with warming meals. Patient daughters assist with meal preparation . Patient daughter and son in law provides transportation to appointments. [name] discussed that they have completed paperwork and sent back to [place] regarding benefits for support in the home. Patient is followed by [facility] home health services.  Placed call to  patient, HIPAA verified  introduced self and [facility] care management services for support and management of chronic conditions.  Patient reports having nurse visit on today, and awaiting speech therapy visit this afternoon.   Conditions  Patient discussed feeling okay on today, he discussed having 2 bouts of dirrhea on yesterday, did not report episode on today. He discussed plans to get refill on vancomycin to continue per plan.  Patient report having a good appetite,eating well. He states using a walker, denies fall he does wear a  alert button.  He discussed his weight today being 167, he states having swelling in legs and home health  has wraps for swelling in his lower  legs. He denies shortness of breath.   Medications  Patient daughter reports that she fills patient pill box. She is unable to review medication list at this time.    Appointments Patient has post discharge visit with PCP on 04/27/20    Plan Will plan return call to patient/daughter in the next week to complete assessment .  Will send PCP barrier involvement letter    Joylene Draft, RN, BSN  [place] Management Coordinator  [phone]- Mobile [phone]- [place]

## 2020-04-24 ENCOUNTER — Other Ambulatory Visit: Payer: Self-pay | Admitting: Cardiology

## 2020-04-25 ENCOUNTER — Other Ambulatory Visit: Payer: Self-pay | Admitting: *Deleted

## 2020-04-25 DIAGNOSIS — M5030 Other cervical disc degeneration, unspecified cervical region: Secondary | ICD-10-CM | POA: Diagnosis not present

## 2020-04-25 DIAGNOSIS — I110 Hypertensive heart disease with heart failure: Secondary | ICD-10-CM | POA: Diagnosis not present

## 2020-04-25 DIAGNOSIS — M6281 Muscle weakness (generalized): Secondary | ICD-10-CM | POA: Diagnosis not present

## 2020-04-25 DIAGNOSIS — R2681 Unsteadiness on feet: Secondary | ICD-10-CM | POA: Diagnosis not present

## 2020-04-25 DIAGNOSIS — I482 Chronic atrial fibrillation, unspecified: Secondary | ICD-10-CM | POA: Diagnosis not present

## 2020-04-25 DIAGNOSIS — I6521 Occlusion and stenosis of right carotid artery: Secondary | ICD-10-CM | POA: Diagnosis not present

## 2020-04-25 DIAGNOSIS — I2510 Atherosclerotic heart disease of native coronary artery without angina pectoris: Secondary | ICD-10-CM | POA: Diagnosis not present

## 2020-04-25 DIAGNOSIS — I5032 Chronic diastolic (congestive) heart failure: Secondary | ICD-10-CM | POA: Diagnosis not present

## 2020-04-25 DIAGNOSIS — M069 Rheumatoid arthritis, unspecified: Secondary | ICD-10-CM | POA: Diagnosis not present

## 2020-04-25 NOTE — Patient Outreach (Signed)
[facility]) Care Management  Cole Camp  04/25/2020   CHAU SABER 07/11/1925 [id number]   Transition of care   Referral Source: Reviewed ping for patient discharge from [facility] SNF noted discharge date 04/13/20 DX: C.Diff infection, fall  Insurance: [employer], [employer]   Patient is 86 year old male with PMHX;  Heart Failure, EF 20-25%. 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  Hospital admissions : 2/5-  01/03/20- [place] - Diarrhea, C.Diff,  sepsis  3/8-3/12/21- [place] - Diarrhea, C.Diff , AKI, [facility]  4/27- 5/3/21[facility] -  Diarrhea, C.Diff, Fall     Subjective:   Successful outreach call to patient daughter Jettie Booze , She reports patient reports having 2 bouts of diarrhea on yesterday. She discussed patient repeated episode of C.Diff/diarrhe episodes.  She discussed being unsure if he can be tested at next  office visit, she mentioned a treatment of fecal transplant that is only available at [facility] but currently not being done.   Daughter reports that they have submitted paperwork to [employer] for patient assistance in home. [name] states patient will not allow his daughter or son in laws to assist with showering.    Encounter Medications:  Outpatient Encounter Medications as of 04/25/2020  Medication Sig Note  . acetaminophen (TYLENOL) 325 MG tablet Take 2 tablets (650 mg total) by mouth every 6 (six) hours as needed for mild pain (or Fever >/= 101).   Marland Kitchen atorvastatin (LIPITOR) 10 MG tablet TAKE 1 TABLET AT BEDTIME   . Calcium-Vitamin D (CALTRATE 600 PLUS-VIT D PO) Take 1 tablet by mouth at bedtime.    . folic acid (FOLVITE) 1 MG tablet Take 1 mg by mouth daily.    Marland Kitchen guaiFENesin (MUCINEX) 600 MG 12 hr tablet Take 600-1,200 mg by mouth 2 (two) times daily as needed (cold/congestion.).   Marland Kitchen LACTOBACILLUS RHAMNOSUS, GG, PO Take 1 capsule by mouth in the  morning and at bedtime.   . methotrexate (RHEUMATREX) 2.5 MG tablet Take 10 mg by mouth once a week. Caution:Chemotherapy. Protect from light. On Saturdays   . midodrine (PROAMATINE) 5 MG tablet Take 5 mg by mouth 2 (two) times daily with a meal.   . Multiple Vitamin (MULTIVITAMIN WITH MINERALS) TABS tablet Take 1 tablet by mouth daily.   . nitroGLYCERIN (NITROSTAT) 0.4 MG SL tablet DISSOLVE 1 TABLET UNDER THE TONGUE EVERY 5 MINUTES AS NEEDED FOR CHEST PAIN (Patient taking differently: Place 0.4 mg under the tongue every 5 (five) minutes as needed for chest pain. ) 02/27/2020: Taking as needed   . potassium chloride SA (KLOR-CON) 20 MEQ tablet Take 2 tablets (40 mEq total) by mouth every morning. (Patient taking differently: Take 20 mEq by mouth every morning. ) 04/25/2020: Takes 10 meq twice daily   . tamsulosin (FLOMAX) 0.4 MG CAPS capsule Take 1 capsule (0.4 mg total) by mouth every evening.   . vancomycin (VANCOCIN) 125 MG capsule Take 125 mg by mouth daily. Vancomycin 1 capsule daily x 1 week , stop on 04/30/20, then begin 1 capsule every 3 days x 2 weeks stop on 05/15/20   . ciprofloxacin (CILOXAN) 0.3 % ophthalmic solution Place 1 drop into the right eye 4 (four) times daily.   . metolazone (ZAROXOLYN) 2.5 MG tablet    . metoprolol succinate (TOPROL-XL) 25 MG 24 hr tablet TAKE ONE-HALF TABLET (12.5 MG TOTAL) DAILY WITH OR IMMEDIATELY FOLLOWING  A MEAL (DOSE CHANGE) (Patient not taking: Reported on 04/25/2020)   . tobramycin (TOBREX) 0.3 % ophthalmic solution Place 1 drop into the right eye every 2 (two) hours.   . vancomycin (VANCOCIN) 50 mg/mL oral solution Take 2.5 mLs (125 mg total) by mouth 2 (two) times daily.   . vancomycin (VANCOCIN) 50 mg/mL oral solution Take 2.5 mLs (125 mg total) by mouth daily.   . vancomycin (VANCOCIN) 50 mg/mL oral solution Take 2.5 mLs (125 mg total) by mouth every other day.   . vancomycin (VANCOCIN) 50 mg/mL oral solution Take 2.5 mLs (125 mg total) by mouth every 3  (three) days.   Marland Kitchen warfarin (COUMADIN) 5 MG tablet Take 1 tablet daily except take 1 1/2 tablets Mon Wed and Fridays or Take as directed by anticoagulation clinic (Patient taking differently: Take 5-7.5 mg by mouth See admin instructions. Take 1 tablet daily except take 1 1/2 tablets Mon Wed and Fridays or Take as directed by anticoagulation clinic)    No facility-administered encounter medications on file as of 04/25/2020.    Functional Status:  In your present state of health, do you have any difficulty performing the following activities: 02/27/2020 01/03/2020  Hearing? Y N  Comment hard of hearing -  Vision? N N  Comment - -  Difficulty concentrating or making decisions? Y N  Walking or climbing stairs? Y Y  Comment uses walker -  Dressing or bathing? N Y  Comment - -  Doing errands, shopping? Tempie Donning  Comment family assist -  Preparing Food and eating ? Y -  Comment family and arranged help assist patient -  Using the Toilet? N -  In the past six months, have you accidently leaked urine? N -  Comment - -  Do you have problems with loss of bowel control? N -  Comment only when having diarrhea -  Managing your Medications? Y -  Comment family assist -  Managing your Finances? Y -  Comment family assist -  Housekeeping or managing your Housekeeping? Y -  Comment famiy and hired help assist -  Some recent data might be hidden    Fall/Depression Screening: Fall Risk  04/25/2020 02/27/2020 12/07/2019  Falls in the past year? 1 1 1   Comment - - -  Number falls in past yr: 1 0 1  Comment - - Fall January 2021  Injury with Fall? 0 0 0  Risk for fall due to : Impaired balance/gait;History of fall(s) History of fall(s);Impaired balance/gait History of fall(s);Medication side effect;Impaired balance/gait;Impaired mobility;Impaired vision  Follow up Falls prevention discussed Falls prevention discussed Falls evaluation completed;Education provided;Falls prevention discussed   PHQ 2/9 Scores  02/27/2020 02/08/2019 10/19/2018 12/18/2017 05/21/2017 05/18/2015  PHQ - 2 Score 0 0 0 0 0 0    Assessment:   C.Diff infection  Patient continued on vancomycin, patient with report of  episode of diarrhea in the last day.   Social  Patient lives at home alone, support in the evening with preparing meals.  Family beginning process of assessing patient VA benefits.  Patient will benefit from bath aide service if available through current home health service if patient agreeable.   High Fall Risk  Patient active with [facility] home health RN, PT, and speech therapy.   Plan  Will collaborate with home health agency if bath aide service can be added to services if patient agreeable.  Will plan return call to patient in the next week to complete assessment.  [facility] CM Care Plan Problem One     Most Recent Value  Care Plan Problem One  At risk for readmission due to recent hospital discharge and SNF stay  related to Fall , C.Diff infection    Role Documenting the Problem One  Care Management Telephonic Coordinator  Care Plan for Problem One  Active  [facility] Long Term Goal   Patient will not experience hosptial readmission over the next 30 days   [facility] Long Term Goal Start Date  04/19/20  Interventions for Problem One Long Term Goal  Review of patient current condition , encouraged daughter to take patient medications to MD visit for review    [facility] CM Short Term Goal #1   Patient will be able to report no falls in the next 30 days   [facility] CM Short Term Goal #1 Start Date  04/19/20  Interventions for Short Term Goal #1  Discussed with daughter patient therapy visits this week ,with speech , physical therapies     [facility] CM Care Plan Problem Two     Most Recent Value  Care Plan Problem Two  Knowledge Deficit related to self care management of Heart failure   Role Documenting the Problem Two  Care Management [place] for Problem Two  Active  Interventions for Problem Two Long Term Goal    Discussed with daughter, monitoring patient pill box to ensure patient taking as prepared.   [facility] Long Term Goal  Over the next 60 days patient will be able to identify/demonstrate  2 self care skills related HF self care management .   [facility] Long Term Goal Start Date  04/19/20  [facility] CM Short Term Goal #1   Over the next 30 days patient will report working toward montioring weight daily   [facility] CM Short Term Goal #1 Start Date  04/19/20  Interventions for Short Term Goal #2   Encouraged daughter to reinforce monitoring weights to identify sudden weight gain as sign of worsening in heart failure symptoms and notifying MD sooner .        Joylene Draft, RN, BSN  [place] Management Coordinator  [phone]- Mobile [phone]- Toll Free Main Office

## 2020-04-26 ENCOUNTER — Ambulatory Visit (INDEPENDENT_AMBULATORY_CARE_PROVIDER_SITE_OTHER): Payer: Medicare Other | Admitting: Adult Health

## 2020-04-26 ENCOUNTER — Encounter: Payer: Self-pay | Admitting: Adult Health

## 2020-04-26 ENCOUNTER — Other Ambulatory Visit: Payer: Self-pay

## 2020-04-26 ENCOUNTER — Telehealth: Payer: Self-pay | Admitting: Adult Health

## 2020-04-26 VITALS — BP 100/50 | HR 88 | Temp 98.5°F

## 2020-04-26 DIAGNOSIS — E039 Hypothyroidism, unspecified: Secondary | ICD-10-CM

## 2020-04-26 DIAGNOSIS — I2510 Atherosclerotic heart disease of native coronary artery without angina pectoris: Secondary | ICD-10-CM | POA: Diagnosis not present

## 2020-04-26 DIAGNOSIS — I482 Chronic atrial fibrillation, unspecified: Secondary | ICD-10-CM | POA: Diagnosis not present

## 2020-04-26 DIAGNOSIS — A0471 Enterocolitis due to Clostridium difficile, recurrent: Secondary | ICD-10-CM

## 2020-04-26 DIAGNOSIS — M5030 Other cervical disc degeneration, unspecified cervical region: Secondary | ICD-10-CM | POA: Diagnosis not present

## 2020-04-26 DIAGNOSIS — M6281 Muscle weakness (generalized): Secondary | ICD-10-CM | POA: Diagnosis not present

## 2020-04-26 DIAGNOSIS — I110 Hypertensive heart disease with heart failure: Secondary | ICD-10-CM | POA: Diagnosis not present

## 2020-04-26 DIAGNOSIS — R2681 Unsteadiness on feet: Secondary | ICD-10-CM | POA: Diagnosis not present

## 2020-04-26 DIAGNOSIS — M069 Rheumatoid arthritis, unspecified: Secondary | ICD-10-CM | POA: Diagnosis not present

## 2020-04-26 DIAGNOSIS — I959 Hypotension, unspecified: Secondary | ICD-10-CM | POA: Diagnosis not present

## 2020-04-26 DIAGNOSIS — I6521 Occlusion and stenosis of right carotid artery: Secondary | ICD-10-CM | POA: Diagnosis not present

## 2020-04-26 DIAGNOSIS — I5032 Chronic diastolic (congestive) heart failure: Secondary | ICD-10-CM | POA: Diagnosis not present

## 2020-04-26 MED ORDER — VANCOMYCIN HCL 125 MG PO CAPS: ORAL_CAPSULE | ORAL | 0 refills | Status: AC

## 2020-04-26 MED ORDER — MIDODRINE HCL 10 MG PO TABS: 10.0000 mg | ORAL_TABLET | Freq: Two times a day (BID) | ORAL | 1 refills | Status: AC

## 2020-04-26 NOTE — Progress Notes (Signed)
Subjective:    Patient ID: [name] [name], male    DOB: [birth year], 86 y.o.   MRN: [id number]  HPI  86 year old male who  has a past medical history of Atrial fibrillation, chronic ([place]), Carotid artery occlusion, Chronic diastolic CHF (congestive heart failure) ([place]), Coronary artery disease, Degenerative disc disease, GERD (gastroesophageal reflux disease), HIV infection ([place]), Hyperlipidemia, Hypertension, Hypothyroidism, Rheumatoid arthritis ([place]), S/P TAVR (transcatheter aortic valve replacement), Skin cancer, Stroke ([place]), TIA (transient ischemic attack), and Urgency incontinence.  He presents to the clinic today for follow up regarding multiple issues.   Recurrent C.Diff infection -he was seen on 03/20/2020 [facility] for recurrent diarrhea x2 to 3 days.  He had been diagnosed twice in February and March 202 23 was C. difficile infection and finished 6 weeks of oral vancomycin a week prior to this admission.  He was again diagnosed with recurrent C. difficile colitis leading to sepsis.  He was discharged to [facility] on 8-week pulsed taper of vancomycin 125 mg.  He has since moved out of [facility] and is back home.  His daughter who is with him today reports that he continues to have diarrhea but this seems to be improving.  He does need a prescription for the last taper of vancomycin.  Hypothyroidism -while at [facility] he was also diagnosed with hypothyroidism and started on Synthroid 50 mcg tablets.  Needs to have a repeat TSH done.  Hypotension -he was also started on midodrine 5 mg by mouth twice daily for hypotension.  Despite this medication he continues to be hypotensive with a blood pressure of 100/50 today.        Review of Systems See HPI   Past Medical History:  Diagnosis Date  . Atrial fibrillation, chronic (HCC)    a. on coumadin   . Carotid artery occlusion    right total internal artery occlusion  . Chronic diastolic CHF  (congestive heart failure) ([place])   . Coronary artery disease    s/p cardiac cath in 1990s, and cardiolite study  in 2005 showing EF 54%  . Degenerative disc disease    cervical spine  . GERD (gastroesophageal reflux disease)   . HIV infection ([place])   . Hyperlipidemia   . Hypertension   . Hypothyroidism   . Rheumatoid arthritis ([place])   . S/P TAVR (transcatheter aortic valve replacement)    Edwards Sapien 3 THV (size 29 mm) via the R TF approach   . Skin cancer    forehead  . Stroke [facility])    TIA   . TIA (transient ischemic attack)   . Urgency incontinence     Social History   Socioeconomic History  . Marital status: Married    Spouse name: Not on file  . Number of children: Not on file  . Years of education: Not on file  . Highest education level: Not on file  Occupational History  . Not on file  Tobacco Use  . Smoking status: Former Smoker    Packs/day: 1.00    Years: 40.00    Pack years: 40.00    Types: Cigarettes    Quit date: 09/12/1989    Years since quitting: 30.6  . Smokeless tobacco: Never Used  . Tobacco comment: quit around 1992  Substance and Sexual Activity  . Alcohol use: No    Alcohol/week: 0.0 standard drinks    Comment: former, quit drinking in 1992  . Drug use: No  . Sexual  activity: Not Currently  Other Topics Concern  . 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 Difficulty of Paying Living Expenses: Not hard at all  Food Insecurity: No Food Insecurity  . Worried About Charity fundraiser in the Last Year: Never true  . Ran Out of Food in the Last Year: Never true  Transportation Needs: No Transportation Needs  . Lack of Transportation (Medical): No  . Lack of Transportation (Non-Medical): No  Physical Activity:   . Days  of Exercise per Week:   . Minutes of Exercise per Session:   Stress:   . Feeling of Stress :   Social Connections:   . Frequency of Communication with Friends and Family:   . Frequency of Social Gatherings with Friends and Family:   . Attends Religious Services:   . Active Member of Clubs or Organizations:   . Attends Archivist Meetings:   Marland Kitchen Marital Status:   Intimate Partner Violence:   . Fear of Current or Ex-Partner:   . Emotionally Abused:   Marland Kitchen Physically Abused:   . Sexually Abused:     Past Surgical History:  Procedure Laterality Date  . AMPUTATION Right 02/21/2015   Procedure: Right 3rd Ray Amputation;  Surgeon: Newt Minion, MD;  Location: [place];  Service: Orthopedics;  Laterality: Right;  Digital block and ankle block with MAC  . CARDIAC CATHETERIZATION  1990s  . CATARACT EXTRACTION W/ INTRAOCULAR LENS  IMPLANT, BILATERAL    . EYE SURGERY     cataracts bilateral  . INTRAOPERATIVE TRANSTHORACIC ECHOCARDIOGRAM  09/14/2018   Procedure: INTRAOPERATIVE TRANSTHORACIC ECHOCARDIOGRAM;  Surgeon: Sherren Mocha, MD;  Location: [place];  Service: Open Heart Surgery;;  . RIGHT/LEFT HEART CATH AND CORONARY ANGIOGRAPHY N/A 07/21/2018   Procedure: RIGHT/LEFT HEART CATH AND CORONARY ANGIOGRAPHY;  Surgeon: Sherren Mocha, MD;  Location: [place] [facility];  Service: Cardiovascular;  Laterality: N/A;  . SKIN CANCER EXCISION  12/2011   forehead  . TRANSCATHETER AORTIC VALVE REPLACEMENT, TRANSFEMORAL N/A 09/14/2018   Procedure: TRANSCATHETER AORTIC VALVE REPLACEMENT, TRANSFEMORAL. 34m EDWARDS SAPIEN3 THV.;  Surgeon: CSherren Mocha MD;  Location: [facility]  Service: Open Heart Surgery;  Laterality: N/A;    Family History  Problem Relation Age of Onset  . Heart disease Mother   . Malignant hyperthermia Mother   . Hypertension Mother   . Heart disease Father   . Malignant hyperthermia Father   . Hyperlipidemia Father   . Hypertension Father   . Heart disease Sister   .  Hypertension Sister     Allergies  Allergen Reactions  . Levofloxacin Rash    "thought they were sticking swords thru me"  . Klor-Con [Potassium Chloride Er] Diarrhea    Current Outpatient Medications on File Prior to Visit  Medication Sig Dispense Refill  . acetaminophen (TYLENOL) 325 MG tablet Take 2 tablets (650 mg total) by mouth every 6 (six) hours as needed for mild pain (or Fever >/= 101).    .Marland Kitchenatorvastatin (LIPITOR) 10 MG tablet TAKE 1 TABLET AT BEDTIME 90 tablet 3  . Calcium-Vitamin D (CALTRATE 600 PLUS-VIT D PO) Take 1 tablet by mouth at bedtime.     . folic acid (FOLVITE) 1 MG tablet  Take 1 mg by mouth daily.     Marland Kitchen LACTOBACILLUS RHAMNOSUS, GG, PO Take 1 capsule by mouth in the morning and at bedtime.    . methotrexate (RHEUMATREX) 2.5 MG tablet Take 10 mg by mouth once a week. Caution:Chemotherapy. Protect from light. On Saturdays    . metolazone (ZAROXOLYN) 2.5 MG tablet     . midodrine (PROAMATINE) 5 MG tablet Take 5 mg by mouth 2 (two) times daily with a meal.    . Multiple Vitamin (MULTIVITAMIN WITH MINERALS) TABS tablet Take 1 tablet by mouth daily.    . nitroGLYCERIN (NITROSTAT) 0.4 MG SL tablet DISSOLVE 1 TABLET UNDER THE TONGUE EVERY 5 MINUTES AS NEEDED FOR CHEST PAIN (Patient taking differently: Place 0.4 mg under the tongue every 5 (five) minutes as needed for chest pain. ) 25 tablet 4  . tamsulosin (FLOMAX) 0.4 MG CAPS capsule Take 1 capsule (0.4 mg total) by mouth every evening. 90 capsule 3  . tobramycin (TOBREX) 0.3 % ophthalmic solution Place 1 drop into the right eye every 2 (two) hours.    . vancomycin (VANCOCIN) 125 MG capsule Take 125 mg by mouth daily. Vancomycin 1 capsule daily x 1 week , stop on 04/30/20, then begin 1 capsule every 3 days x 2 weeks stop on 05/15/20    . vancomycin (VANCOCIN) 50 mg/mL oral solution Take 2.5 mLs (125 mg total) by mouth 2 (two) times daily.    . vancomycin (VANCOCIN) 50 mg/mL oral solution Take 2.5 mLs (125 mg total) by mouth  daily.    . vancomycin (VANCOCIN) 50 mg/mL oral solution Take 2.5 mLs (125 mg total) by mouth every other day.    . vancomycin (VANCOCIN) 50 mg/mL oral solution Take 2.5 mLs (125 mg total) by mouth every 3 (three) days.    Marland Kitchen warfarin (COUMADIN) 5 MG tablet Take 1 tablet daily except take 1 1/2 tablets Mon Wed and Fridays or Take as directed by anticoagulation clinic (Patient taking differently: Take 5-7.5 mg by mouth See admin instructions. Take 1 tablet daily except take 1 1/2 tablets Mon Wed and Fridays or Take as directed by anticoagulation clinic) 120 tablet 1   No current facility-administered medications on file prior to visit.    BP (!) 100/50   Pulse 88   Temp 98.5 F (36.9 C) (Temporal)   SpO2 97%       Objective:   Physical Exam Vitals and nursing note reviewed.  Constitutional:      Appearance: Normal appearance.  Cardiovascular:     Rate and Rhythm: Normal rate and regular rhythm.     Pulses: Normal pulses.     Heart sounds: Normal heart sounds.  Pulmonary:     Effort: Pulmonary effort is normal.     Breath sounds: Normal breath sounds.  Musculoskeletal:        General: Normal range of motion.  Skin:    General: Skin is warm and dry.  Neurological:     General: No focal deficit present.     Mental Status: He is alert and oriented to person, place, and time.  Psychiatric:        Mood and Affect: Mood normal.        Behavior: Behavior normal.        Thought Content: Thought content normal.        Judgment: Judgment normal.       Assessment & Plan:  1. Recurrent colitis due to Clostridium difficile - Consider referral to ID if  C.diff does not clear up.  - vancomycin (VANCOCIN) 125 MG capsule; Take one capsule every three days x 2 weeks  Dispense: 6 capsule; Refill: 0  2. Hypothyroidism, unspecified type - Consider increase in synthroid  - TSH  3. Hypotension, unspecified hypotension type -We will increase midodrine from 5 mg to 10 mg twice daily.  His  daughter was advised to monitor blood pressure at home and let me know if blood pressure is consistently going above 638 systolic - midodrine (PROAMATINE) 10 MG tablet; Take 1 tablet (10 mg total) by mouth 2 (two) times daily with a meal.  Dispense: 180 tablet; Refill: 1   [name], NP

## 2020-04-26 NOTE — Telephone Encounter (Signed)
Debbie with [facility] would like a verbal order to apply cream on leg and if he can get a Education officer, museum to help with his insurance since he's a veteran. Thanks  Debbie's [phone]

## 2020-04-26 NOTE — Telephone Encounter (Signed)
[name] [name] from [facility] needs verbal orders for OT 2w1, 1w1, and 2w1.   [name] can be reached at [phone] -ok to leave a detailed vm

## 2020-04-26 NOTE — Telephone Encounter (Signed)
Will notified to proceed with orders.  Nothing further needed. 

## 2020-04-26 NOTE — Telephone Encounter (Signed)
Spoke to [name] [name] and advised she may proceed with orders.  Nothing further needed.

## 2020-04-26 NOTE — Telephone Encounter (Signed)
Ok for verbal orders ?

## 2020-04-27 ENCOUNTER — Telehealth: Payer: Self-pay | Admitting: Adult Health

## 2020-04-27 ENCOUNTER — Other Ambulatory Visit: Payer: Self-pay | Admitting: Adult Health

## 2020-04-27 LAB — TSH: TSH: 4.13 u[IU]/mL (ref 0.35–4.50)

## 2020-04-27 MED ORDER — LEVOTHYROXINE SODIUM 50 MCG PO TABS: 50.0000 ug | ORAL_TABLET | Freq: Every day | ORAL | 3 refills | Status: AC

## 2020-04-27 NOTE — Telephone Encounter (Signed)
Updated his daughter [name]) about his TSH results. He is controlled on Synthroid 50 mcg

## 2020-05-01 DIAGNOSIS — I110 Hypertensive heart disease with heart failure: Secondary | ICD-10-CM | POA: Diagnosis not present

## 2020-05-01 DIAGNOSIS — M5030 Other cervical disc degeneration, unspecified cervical region: Secondary | ICD-10-CM | POA: Diagnosis not present

## 2020-05-01 DIAGNOSIS — I2510 Atherosclerotic heart disease of native coronary artery without angina pectoris: Secondary | ICD-10-CM | POA: Diagnosis not present

## 2020-05-01 DIAGNOSIS — M069 Rheumatoid arthritis, unspecified: Secondary | ICD-10-CM | POA: Diagnosis not present

## 2020-05-01 DIAGNOSIS — R2681 Unsteadiness on feet: Secondary | ICD-10-CM | POA: Diagnosis not present

## 2020-05-01 DIAGNOSIS — I5032 Chronic diastolic (congestive) heart failure: Secondary | ICD-10-CM | POA: Diagnosis not present

## 2020-05-01 DIAGNOSIS — M6281 Muscle weakness (generalized): Secondary | ICD-10-CM | POA: Diagnosis not present

## 2020-05-01 DIAGNOSIS — I482 Chronic atrial fibrillation, unspecified: Secondary | ICD-10-CM | POA: Diagnosis not present

## 2020-05-01 DIAGNOSIS — I6521 Occlusion and stenosis of right carotid artery: Secondary | ICD-10-CM | POA: Diagnosis not present

## 2020-05-02 DIAGNOSIS — M5030 Other cervical disc degeneration, unspecified cervical region: Secondary | ICD-10-CM | POA: Diagnosis not present

## 2020-05-02 DIAGNOSIS — M6281 Muscle weakness (generalized): Secondary | ICD-10-CM | POA: Diagnosis not present

## 2020-05-02 DIAGNOSIS — I110 Hypertensive heart disease with heart failure: Secondary | ICD-10-CM | POA: Diagnosis not present

## 2020-05-02 DIAGNOSIS — R2681 Unsteadiness on feet: Secondary | ICD-10-CM | POA: Diagnosis not present

## 2020-05-02 DIAGNOSIS — I482 Chronic atrial fibrillation, unspecified: Secondary | ICD-10-CM | POA: Diagnosis not present

## 2020-05-02 DIAGNOSIS — M069 Rheumatoid arthritis, unspecified: Secondary | ICD-10-CM | POA: Diagnosis not present

## 2020-05-02 DIAGNOSIS — I6521 Occlusion and stenosis of right carotid artery: Secondary | ICD-10-CM | POA: Diagnosis not present

## 2020-05-02 DIAGNOSIS — I5032 Chronic diastolic (congestive) heart failure: Secondary | ICD-10-CM | POA: Diagnosis not present

## 2020-05-02 DIAGNOSIS — I2510 Atherosclerotic heart disease of native coronary artery without angina pectoris: Secondary | ICD-10-CM | POA: Diagnosis not present

## 2020-05-03 ENCOUNTER — Encounter: Payer: Self-pay | Admitting: *Deleted

## 2020-05-03 ENCOUNTER — Other Ambulatory Visit: Payer: Self-pay | Admitting: *Deleted

## 2020-05-03 DIAGNOSIS — R2681 Unsteadiness on feet: Secondary | ICD-10-CM | POA: Diagnosis not present

## 2020-05-03 DIAGNOSIS — I6521 Occlusion and stenosis of right carotid artery: Secondary | ICD-10-CM | POA: Diagnosis not present

## 2020-05-03 DIAGNOSIS — M069 Rheumatoid arthritis, unspecified: Secondary | ICD-10-CM | POA: Diagnosis not present

## 2020-05-03 DIAGNOSIS — M5030 Other cervical disc degeneration, unspecified cervical region: Secondary | ICD-10-CM | POA: Diagnosis not present

## 2020-05-03 DIAGNOSIS — I110 Hypertensive heart disease with heart failure: Secondary | ICD-10-CM | POA: Diagnosis not present

## 2020-05-03 DIAGNOSIS — I482 Chronic atrial fibrillation, unspecified: Secondary | ICD-10-CM | POA: Diagnosis not present

## 2020-05-03 DIAGNOSIS — I2510 Atherosclerotic heart disease of native coronary artery without angina pectoris: Secondary | ICD-10-CM | POA: Diagnosis not present

## 2020-05-03 DIAGNOSIS — I5032 Chronic diastolic (congestive) heart failure: Secondary | ICD-10-CM | POA: Diagnosis not present

## 2020-05-03 DIAGNOSIS — M6281 Muscle weakness (generalized): Secondary | ICD-10-CM | POA: Diagnosis not present

## 2020-05-03 NOTE — Patient Outreach (Signed)
[facility]) Care Management  [place]  05/03/2020   [name] [name] 09-Oct-1925 [id number]    Referral Source: Reviewed ping for patient discharge from [facility] SN[facility] noted discharge date 04/13/20 DX: C.Diff infection, fall  Insurance:[employer], Tricare   Patient is 86 year old male with PMHX; Heart Failure, EF 20-25%. 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  Hospital admissions: 2/5- 01/03/20- [place] - Diarrhea, C.Diff, sepsis  3/8-3/12/21- [place] - Diarrhea, C.Diff , AKI, [facility]  4/27- 5/3/21[facility] - Diarrhea, C.Diff, Fall    Subjective:  Successful outreach call to patient he reports doing good on today. He reports having a visit from home health occupational and speech therapy on today. He reports that he legs are looking a little better the nurse has another type dressing to legs.  He denies increase of shortness of breath.  He denies having any recent diarrhea states " that pill is working".  .   Encounter Medications:  Outpatient Encounter Medications as of 05/03/2020  Medication Sig Note  . acetaminophen (TYLENOL) 325 MG tablet Take 2 tablets (650 mg total) by mouth every 6 (six) hours as needed for mild pain (or Fever >/= 101).   Marland Kitchen ammonium lactate (AMLACTIN) 12 % cream Apply bilaterally twice daily to legs   . atorvastatin (LIPITOR) 10 MG tablet TAKE 1 TABLET AT BEDTIME   . Calcium-Vitamin D (CALTRATE 600 PLUS-VIT D PO) Take 1 tablet by mouth at bedtime.    . folic acid (FOLVITE) 1 MG tablet Take 1 mg by mouth daily.    . furosemide (LASIX) 40 MG tablet Take 40 mg by mouth daily. For CHF   . LACTOBACILLUS RHAMNOSUS, GG, PO Take 1 capsule by mouth in the morning and at bedtime.   Marland Kitchen levothyroxine (SYNTHROID) 50 MCG tablet Take 1 tablet (50 mcg total) by mouth daily before breakfast.   . methotrexate (RHEUMATREX) 2.5 MG tablet Take 10 mg by mouth once a week.  Caution:Chemotherapy. Protect from light. On Saturdays   . metolazone (ZAROXOLYN) 2.5 MG tablet Take on Saturday for melanoma   . midodrine (PROAMATINE) 10 MG tablet Take 1 tablet (10 mg total) by mouth 2 (two) times daily with a meal.   . Multiple Vitamin (MULTIVITAMIN WITH MINERALS) TABS tablet Take 1 tablet by mouth daily.   . nitroGLYCERIN (NITROSTAT) 0.4 MG SL tablet DISSOLVE 1 TABLET UNDER THE TONGUE EVERY 5 MINUTES AS NEEDED FOR CHEST PAIN (Patient taking differently: Place 0.4 mg under the tongue every 5 (five) minutes as needed for chest pain. ) 02/27/2020: Taking as needed   . potassium chloride (MICRO-K) 10 MEQ CR capsule Take 10 mEq by mouth 2 (two) times daily.   . tamsulosin (FLOMAX) 0.4 MG CAPS capsule Take 1 capsule (0.4 mg total) by mouth every evening.   . Triamcinolone Acetonide (TRIAMCINOLONE 0.1 % CREAM : EUCERIN) CREA Apply bilaterally twice daily to legs for dermatitis..   . vancomycin (VANCOCIN) 125 MG capsule Take one capsule every three days x 2 weeks   . vancomycin (VANCOCIN) 50 mg/mL oral solution Take 2.5 mLs (125 mg total) by mouth 2 (two) times daily.   Marland Kitchen warfarin (COUMADIN) 5 MG tablet Take 1 tablet daily except take 1 1/2 tablets Mon Wed and Fridays or Take as directed by anticoagulation clinic (Patient taking differently: Take 5-7.5 mg by mouth See admin instructions. Take 1 tablet daily except take 1 1/2 tablets Mon Wed  and Fridays or Take as directed by anticoagulation clinic)    No facility-administered encounter medications on file as of 05/03/2020.    Functional Status:  In your present state of health, do you have any difficulty performing the following activities: 05/03/2020 02/27/2020  Hearing? Y Y  Comment - hard of hearing  Vision? Y N  Difficulty concentrating or making decisions? Tempie Donning  Comment family helps with organizing appt/meds -  Walking or climbing stairs? Y Y  Comment using walker, HH PT, OT uses walker  Dressing or bathing? N N  Doing errands,  shopping? Tempie Donning  Comment family assist family assist  Preparing Food and eating ? Tempie Donning  Comment family prepares and has arranged care provider for warming meals in afternoon family and arranged help assist patient  Using the Toilet? N N  In the past six months, have you accidently leaked urine? Y N  Do you have problems with loss of bowel control? Y N  Comment - only when having diarrhea  Managing your Medications? Tempie Donning  Comment daughters assist family assist  Managing your Finances? Tempie Donning  Comment - family assist  Housekeeping or managing your Housekeeping? Y Y  Comment famly assist famiy and hired help assist  Some recent data might be hidden    Fall/Depression Screening: Fall Risk  04/25/2020 02/27/2020 12/07/2019  Falls in the past year? 1 1 1   Comment - - -  Number falls in past yr: 1 0 1  Comment - - Fall January 2021  Injury with Fall? 0 0 0  Risk for fall due to : Impaired balance/gait;History of fall(s) History of fall(s);Impaired balance/gait History of fall(s);Medication side effect;Impaired balance/gait;Impaired mobility;Impaired vision  Follow up Falls prevention discussed Falls prevention discussed Falls evaluation completed;Education provided;Falls prevention discussed   PHQ 2/9 Scores 02/27/2020 02/08/2019 10/19/2018 12/18/2017 05/21/2017 05/18/2015  PHQ - 2 Score 0 0 0 0 0 0    Assessment:   C.Diff Continued on tapering vancomycin schedule, reported continued  improvement in diarrhea episode . Good appetite .   Heart Failure  Denies worsening symptoms , continues working on checking weights , no sudden weight gains. Continues with home health services of RN, PT and OT .    Social  Has support daily with meals, he manages doing daily personal care.  Family pursuing patient VA benefits for additional support in home.   Plan Placed call to [facility] office  to inquire regarding if bath aide services would be available to add to patient care representatives states bath aide  not available.  Will plan transition of care call in the next week, complete assessment .    [facility] CM Care Plan Problem One     Most Recent Value  Care Plan Problem One At risk for readmission due to recent hospital discharge and SNF stay  related to Fall , C.Diff infection    Role Documenting the Problem One Care Management Telephonic Coordinator  Care Plan for Problem One Active  THN Long Term Goal  Patient will not experience hosptial readmission over the next 30 days   THN Long Term Goal Start Date 04/19/20  Interventions for Problem One Long Term Goal Assessed for symptoms of diarrhea . reinforced continued taking eating balanced meals as prepared, including yogurt.   THN CM Short Term Goal #1  Patient will be able to report no falls in the next 30 days   THN CM Short Term Goal #1 Start Date 04/19/20  Interventions for  Short Term Goal #1 Reinforced using walker at all times to help with balance and help prevent falls     [facility] CM Care Plan Problem Two     Most Recent Value  Care Plan Problem Two Knowledge Deficit related to self care management of Heart failure   Role Documenting the Problem Two Care Management Coordinator  Care Plan for Problem Two Active  Interventions for Problem Two Long Term Goal  Reviewed current symptoms , reinforced continued medications as prepared, notifying Daughter of worsening concerns for shortness of breath , swelling or sudden weight gain of 3 pounds in a day or 5 in a week.   THN Long Term Goal Over the next 60 days patient will be able to identify/demonstrate  2 self care skills related HF self care management .   THN Long Term Goal Start Date 04/19/20  THN CM Short Term Goal #1  Over the next 30 days patient will report working toward montioring weight daily   THN CM Short Term Goal #1 Start Date 04/19/20  Interventions for Short Term Goal #2  Reviewed today's weight , of 170 no sudden increase .        Joylene Draft, RN, BSN  [place] Management Coordinator  [phone]- Mobile [phone]- Toll Free Main Office

## 2020-05-04 DIAGNOSIS — M069 Rheumatoid arthritis, unspecified: Secondary | ICD-10-CM | POA: Diagnosis not present

## 2020-05-04 DIAGNOSIS — M5030 Other cervical disc degeneration, unspecified cervical region: Secondary | ICD-10-CM | POA: Diagnosis not present

## 2020-05-04 DIAGNOSIS — I6521 Occlusion and stenosis of right carotid artery: Secondary | ICD-10-CM | POA: Diagnosis not present

## 2020-05-04 DIAGNOSIS — I110 Hypertensive heart disease with heart failure: Secondary | ICD-10-CM | POA: Diagnosis not present

## 2020-05-04 DIAGNOSIS — R2681 Unsteadiness on feet: Secondary | ICD-10-CM | POA: Diagnosis not present

## 2020-05-04 DIAGNOSIS — M6281 Muscle weakness (generalized): Secondary | ICD-10-CM | POA: Diagnosis not present

## 2020-05-04 DIAGNOSIS — I2510 Atherosclerotic heart disease of native coronary artery without angina pectoris: Secondary | ICD-10-CM | POA: Diagnosis not present

## 2020-05-04 DIAGNOSIS — I482 Chronic atrial fibrillation, unspecified: Secondary | ICD-10-CM | POA: Diagnosis not present

## 2020-05-04 DIAGNOSIS — I5032 Chronic diastolic (congestive) heart failure: Secondary | ICD-10-CM | POA: Diagnosis not present

## 2020-05-05 ENCOUNTER — Other Ambulatory Visit: Payer: Self-pay | Admitting: Cardiology

## 2020-05-07 DIAGNOSIS — I6521 Occlusion and stenosis of right carotid artery: Secondary | ICD-10-CM | POA: Diagnosis not present

## 2020-05-07 DIAGNOSIS — M069 Rheumatoid arthritis, unspecified: Secondary | ICD-10-CM | POA: Diagnosis not present

## 2020-05-07 DIAGNOSIS — I110 Hypertensive heart disease with heart failure: Secondary | ICD-10-CM | POA: Diagnosis not present

## 2020-05-07 DIAGNOSIS — I5032 Chronic diastolic (congestive) heart failure: Secondary | ICD-10-CM | POA: Diagnosis not present

## 2020-05-07 DIAGNOSIS — I482 Chronic atrial fibrillation, unspecified: Secondary | ICD-10-CM | POA: Diagnosis not present

## 2020-05-07 DIAGNOSIS — I2510 Atherosclerotic heart disease of native coronary artery without angina pectoris: Secondary | ICD-10-CM | POA: Diagnosis not present

## 2020-05-07 DIAGNOSIS — M5030 Other cervical disc degeneration, unspecified cervical region: Secondary | ICD-10-CM | POA: Diagnosis not present

## 2020-05-07 DIAGNOSIS — R2681 Unsteadiness on feet: Secondary | ICD-10-CM | POA: Diagnosis not present

## 2020-05-07 DIAGNOSIS — M6281 Muscle weakness (generalized): Secondary | ICD-10-CM | POA: Diagnosis not present

## 2020-05-07 NOTE — Telephone Encounter (Signed)
Yes refill please. Thanks!

## 2020-05-08 DIAGNOSIS — M069 Rheumatoid arthritis, unspecified: Secondary | ICD-10-CM | POA: Diagnosis not present

## 2020-05-08 DIAGNOSIS — I2510 Atherosclerotic heart disease of native coronary artery without angina pectoris: Secondary | ICD-10-CM | POA: Diagnosis not present

## 2020-05-08 DIAGNOSIS — R2681 Unsteadiness on feet: Secondary | ICD-10-CM | POA: Diagnosis not present

## 2020-05-08 DIAGNOSIS — M6281 Muscle weakness (generalized): Secondary | ICD-10-CM | POA: Diagnosis not present

## 2020-05-08 DIAGNOSIS — M5030 Other cervical disc degeneration, unspecified cervical region: Secondary | ICD-10-CM | POA: Diagnosis not present

## 2020-05-08 DIAGNOSIS — I5032 Chronic diastolic (congestive) heart failure: Secondary | ICD-10-CM | POA: Diagnosis not present

## 2020-05-08 DIAGNOSIS — I110 Hypertensive heart disease with heart failure: Secondary | ICD-10-CM | POA: Diagnosis not present

## 2020-05-08 DIAGNOSIS — I6521 Occlusion and stenosis of right carotid artery: Secondary | ICD-10-CM | POA: Diagnosis not present

## 2020-05-08 DIAGNOSIS — I482 Chronic atrial fibrillation, unspecified: Secondary | ICD-10-CM | POA: Diagnosis not present

## 2020-05-10 ENCOUNTER — Other Ambulatory Visit: Payer: Self-pay | Admitting: *Deleted

## 2020-05-10 DIAGNOSIS — M069 Rheumatoid arthritis, unspecified: Secondary | ICD-10-CM | POA: Diagnosis not present

## 2020-05-10 DIAGNOSIS — M5030 Other cervical disc degeneration, unspecified cervical region: Secondary | ICD-10-CM | POA: Diagnosis not present

## 2020-05-10 DIAGNOSIS — I482 Chronic atrial fibrillation, unspecified: Secondary | ICD-10-CM | POA: Diagnosis not present

## 2020-05-10 DIAGNOSIS — I6521 Occlusion and stenosis of right carotid artery: Secondary | ICD-10-CM | POA: Diagnosis not present

## 2020-05-10 DIAGNOSIS — I110 Hypertensive heart disease with heart failure: Secondary | ICD-10-CM | POA: Diagnosis not present

## 2020-05-10 DIAGNOSIS — I5032 Chronic diastolic (congestive) heart failure: Secondary | ICD-10-CM | POA: Diagnosis not present

## 2020-05-10 DIAGNOSIS — M6281 Muscle weakness (generalized): Secondary | ICD-10-CM | POA: Diagnosis not present

## 2020-05-10 DIAGNOSIS — R2681 Unsteadiness on feet: Secondary | ICD-10-CM | POA: Diagnosis not present

## 2020-05-10 DIAGNOSIS — I2510 Atherosclerotic heart disease of native coronary artery without angina pectoris: Secondary | ICD-10-CM | POA: Diagnosis not present

## 2020-05-10 NOTE — Patient Outreach (Signed)
[facility]) Care Management  [place]  05/10/2020   [name] [name] 1925/08/19 [id number]   Transition of care    Referral Source: Reviewed ping for patient discharge from [facility] SNF noted discharge date 04/13/20 DX: C.Diff infection, fall  Insurance:[employer], Tricare   Patient is 86 year old male with PMHX; Heart Failure, EF 20-25%. 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  Hospital admissions: 2/5- 01/03/20- [place] - Diarrhea, C.Diff, sepsis  3/8-3/12/21- [place] - Diarrhea, C.Diff , AKI, [facility]  4/27- 5/3/21[facility] - Diarrhea, C.Diff, Fall     Subjective:  Outreach call to patient , [facility] reports [facility], request return later.  Returned call to patient he reports feeling pretty good on today . He reports having one episode of loose  on today, he discussed that he is aware the medication will end soon and hopefully the diarrhea will not return. He denies having more than 1 loose stool on today.   Patient denies shortness of breath, reports his weight is about the same , 170 on last weight. He reports to continue to have swelling in lower legs and having wraps to legs.  He report continues to have a good appetite and intake .    Encounter Medications:  Outpatient Encounter Medications as of 05/10/2020  Medication Sig Note  . acetaminophen (TYLENOL) 325 MG tablet Take 2 tablets (650 mg total) by mouth every 6 (six) hours as needed for mild pain (or Fever >/= 101).   Marland Kitchen ammonium lactate (AMLACTIN) 12 % cream Apply bilaterally twice daily to legs   . atorvastatin (LIPITOR) 10 MG tablet TAKE 1 TABLET AT BEDTIME   . Calcium-Vitamin D (CALTRATE 600 PLUS-VIT D PO) Take 1 tablet by mouth at bedtime.    . folic acid (FOLVITE) 1 MG tablet Take 1 mg by mouth daily.    . furosemide (LASIX) 40 MG tablet Take 40 mg by mouth daily. For CHF   .  LACTOBACILLUS RHAMNOSUS, GG, PO Take 1 capsule by mouth in the morning and at bedtime.   Marland Kitchen levothyroxine (SYNTHROID) 50 MCG tablet Take 1 tablet (50 mcg total) by mouth daily before breakfast.   . methotrexate (RHEUMATREX) 2.5 MG tablet Take 10 mg by mouth once a week. Caution:Chemotherapy. Protect from light. On Saturdays   . metolazone (ZAROXOLYN) 2.5 MG tablet Take on Saturday for melanoma   . midodrine (PROAMATINE) 10 MG tablet Take 1 tablet (10 mg total) by mouth 2 (two) times daily with a meal.   . Multiple Vitamin (MULTIVITAMIN WITH MINERALS) TABS tablet Take 1 tablet by mouth daily.   . nitroGLYCERIN (NITROSTAT) 0.4 MG SL tablet DISSOLVE 1 TABLET UNDER THE TONGUE EVERY 5 MINUTES AS NEEDED FOR CHEST PAIN (Patient taking differently: Place 0.4 mg under the tongue every 5 (five) minutes as needed for chest pain. ) 02/27/2020: Taking as needed   . potassium chloride (MICRO-K) 10 MEQ CR capsule Take 10 mEq by mouth 2 (two) times daily.   . tamsulosin (FLOMAX) 0.4 MG CAPS capsule TAKE 1 CAPSULE EVERY EVENING   . Triamcinolone Acetonide (TRIAMCINOLONE 0.1 % CREAM : EUCERIN) CREA Apply bilaterally twice daily to legs for dermatitis..   . vancomycin (VANCOCIN) 125 MG capsule Take one capsule every three days x 2 weeks   . vancomycin (VANCOCIN) 50 mg/mL oral solution Take 2.5 mLs (125 mg total) by mouth 2 (two) times daily.   Marland Kitchen  warfarin (COUMADIN) 5 MG tablet Take 1 tablet daily except take 1 1/2 tablets Mon Wed and Fridays or Take as directed by anticoagulation clinic (Patient taking differently: Take 5-7.5 mg by mouth See admin instructions. Take 1 tablet daily except take 1 1/2 tablets Mon Wed and Fridays or Take as directed by anticoagulation clinic)    No facility-administered encounter medications on file as of 05/10/2020.    Functional Status:  In your present state of health, do you have any difficulty performing the following activities: 05/03/2020 02/27/2020  Hearing? Y Y  Comment - hard of  hearing  Vision? Y N  Difficulty concentrating or making decisions? Tempie Donning  Comment family helps with organizing appt/meds -  Walking or climbing stairs? Y Y  Comment using walker, HH PT, OT uses walker  Dressing or bathing? N N  Doing errands, shopping? Tempie Donning  Comment family assist family assist  Preparing Food and eating ? Tempie Donning  Comment family prepares and has arranged care provider for warming meals in afternoon family and arranged help assist patient  Using the Toilet? N N  In the past six months, have you accidently leaked urine? Y N  Do you have problems with loss of bowel control? Y N  Comment - only when having diarrhea  Managing your Medications? Tempie Donning  Comment daughters assist family assist  Managing your Finances? Tempie Donning  Comment - family assist  Housekeeping or managing your Housekeeping? Y Y  Comment famly assist famiy and hired help assist  Some recent data might be hidden    Fall/Depression Screening: Fall Risk  04/25/2020 02/27/2020 12/07/2019  Falls in the past year? 1 1 1   Comment - - -  Number falls in past yr: 1 0 1  Comment - - Fall January 2021  Injury with Fall? 0 0 0  Risk for fall due to : Impaired balance/gait;History of fall(s) History of fall(s);Impaired balance/gait History of fall(s);Medication side effect;Impaired balance/gait;Impaired mobility;Impaired vision  Follow up Falls prevention discussed Falls prevention discussed Falls evaluation completed;Education provided;Falls prevention discussed   PHQ 2/9 Scores 05/10/2020 02/27/2020 02/08/2019 10/19/2018 12/18/2017 05/21/2017 05/18/2015  PHQ - 2 Score 0 0 0 0 0 0 0   SDOH Screenings   Alcohol Screen:   . Last Alcohol Screening Score (AUDIT):   Depression (PHQ2-9): Low Risk   . PHQ-2 Score: 0  Financial Resource Strain: Low Risk   . Difficulty of Paying Living Expenses: Not hard at all  Food Insecurity: No Food Insecurity  . Worried About Charity fundraiser in the Last Year: Never true  . Ran Out of Food in the  Last Year: Never true  Housing:   . Last Housing Risk Score:   Physical Activity:   . Days of Exercise per Week:   . Minutes of Exercise per Session:   Social Connections:   . Frequency of Communication with Friends and Family:   . Frequency of Social Gatherings with Friends and Family:   . Attends Religious Services:   . Active Member of Clubs or Organizations:   . Attends Archivist Meetings:   Marland Kitchen Marital Status:   Stress:   . Feeling of Stress :   Tobacco Use: Medium Risk  . Smoking Tobacco Use: Former Smoker  . Smokeless Tobacco Use: Never Used  Transportation Needs: No Transportation Needs  . Lack of Transportation (Medical): No  . Lack of Transportation (Non-Medical): No    Assessment:  C.Diff Patient currently continuing with  vancomycin as prescribed. Patient without increase in diarrhea, denies dizziness, tolerating food and liquids   Heart failure  No worsening symptoms, patient weighs about twice weekly with reminders from home health . He continues with UNNA wrap to lower legs for swelling. He limits salt in diet, does not add to foods.   High Fall Risk. Denies recent fall, continues to use walker. Continues with home health occupational therapy.  Social  Patient has home health services, he lives at home alone with support in the evening with meal prep and daughters visit each Saturday. Per patient daughter they have completed paper work to access VA benefit for assistance in home.   Plan:  Care Coordination call to [facility] home health able to speak with [facility] supervisor , she discussed patient continues with home health RN, OT , he continues with UNNA boot to lower legs for swelling.  Will plan call in next week to patient daughter for follow up to  assess progress with VA benefit additional support needs and follow up on palliative care involvement . Will send EMMI handout of C.Diff, weighing daily and THN calendar .  Will send PCP initial  visit note.    [facility] CM Care Plan Problem One     Most Recent Value  Care Plan Problem One At risk for readmission due to recent hospital discharge and SNF stay  related to Fall , C.Diff infection    Role Documenting the Problem One Care Management Telephonic Coordinator  Care Plan for Problem One Active  THN Long Term Goal  Patient will not experience hosptial readmission over the next 30 days   THN Long Term Goal Start Date 04/19/20  Interventions for Problem One Long Term Goal Discussed current clinical state, reviewed symptom of increased weakness, 3 to 4 watery stool in a day, dark urine.. Encouraged adequate fluid intake and continuing with daily yogurt. Will send on C.Diff , will review .   THN CM Short Term Goal #1  Patient will be able to report no falls in the next 30 days   THN CM Short Term Goal #1 Start Date 04/19/20  Interventions for Short Term Goal #1 Encouraged continuing to participate with home therapy, using rolling walker,, change position slowly when getting up.     THN CM Care Plan Problem Two     Most Recent Value  Care Plan Problem Two Knowledge Deficit related to self care management of Heart failure   Role Documenting the Problem Two Care Management Coordinator  Care Plan for Problem Two Active  Interventions for Problem Two Long Term Goal  Discussed with patient current symptoms, no worsening symptoms. Encouraged keeping legs elevated when sitting in chair. Reviewed worsening symptoms of sudden weight gain of 3 pounds in a day and 5 in a week increased shortness of breath , swelling encouraged notifying his daughter of concerns that she may contact MD.   [facility] Long Term Goal Over the next 60 days patient will be able to identify/demonstrate  2 self care skills related HF self care management .   THN Long Term Goal Start Date 04/19/20  THN CM Short Term Goal #1  Over the next 30 days patient will report working toward montioring weight daily   THN CM Short Term Goal #1  Start Date 04/19/20  Interventions for Short Term Goal #2  Reinforced benefits of keeping close eye on daily weights, will send EMMI handout on keeping track of daily weights  Joylene Draft, RN, BSN  [place] Management Coordinator  [phone]- Mobile [phone]- Toll Free Main Office

## 2020-05-12 DIAGNOSIS — I2510 Atherosclerotic heart disease of native coronary artery without angina pectoris: Secondary | ICD-10-CM | POA: Diagnosis not present

## 2020-05-12 DIAGNOSIS — R2681 Unsteadiness on feet: Secondary | ICD-10-CM | POA: Diagnosis not present

## 2020-05-12 DIAGNOSIS — I6521 Occlusion and stenosis of right carotid artery: Secondary | ICD-10-CM | POA: Diagnosis not present

## 2020-05-12 DIAGNOSIS — I482 Chronic atrial fibrillation, unspecified: Secondary | ICD-10-CM | POA: Diagnosis not present

## 2020-05-12 DIAGNOSIS — M069 Rheumatoid arthritis, unspecified: Secondary | ICD-10-CM | POA: Diagnosis not present

## 2020-05-12 DIAGNOSIS — M6281 Muscle weakness (generalized): Secondary | ICD-10-CM | POA: Diagnosis not present

## 2020-05-12 DIAGNOSIS — I110 Hypertensive heart disease with heart failure: Secondary | ICD-10-CM | POA: Diagnosis not present

## 2020-05-12 DIAGNOSIS — I5032 Chronic diastolic (congestive) heart failure: Secondary | ICD-10-CM | POA: Diagnosis not present

## 2020-05-12 DIAGNOSIS — M5030 Other cervical disc degeneration, unspecified cervical region: Secondary | ICD-10-CM | POA: Diagnosis not present

## 2020-05-14 DIAGNOSIS — R2681 Unsteadiness on feet: Secondary | ICD-10-CM | POA: Diagnosis not present

## 2020-05-14 DIAGNOSIS — I5032 Chronic diastolic (congestive) heart failure: Secondary | ICD-10-CM | POA: Diagnosis not present

## 2020-05-14 DIAGNOSIS — M6281 Muscle weakness (generalized): Secondary | ICD-10-CM | POA: Diagnosis not present

## 2020-05-14 DIAGNOSIS — M069 Rheumatoid arthritis, unspecified: Secondary | ICD-10-CM | POA: Diagnosis not present

## 2020-05-14 DIAGNOSIS — I6521 Occlusion and stenosis of right carotid artery: Secondary | ICD-10-CM | POA: Diagnosis not present

## 2020-05-14 DIAGNOSIS — M5030 Other cervical disc degeneration, unspecified cervical region: Secondary | ICD-10-CM | POA: Diagnosis not present

## 2020-05-14 DIAGNOSIS — I110 Hypertensive heart disease with heart failure: Secondary | ICD-10-CM | POA: Diagnosis not present

## 2020-05-14 DIAGNOSIS — I2510 Atherosclerotic heart disease of native coronary artery without angina pectoris: Secondary | ICD-10-CM | POA: Diagnosis not present

## 2020-05-14 DIAGNOSIS — I482 Chronic atrial fibrillation, unspecified: Secondary | ICD-10-CM | POA: Diagnosis not present

## 2020-05-15 ENCOUNTER — Telehealth: Payer: Self-pay | Admitting: Adult Health

## 2020-05-15 ENCOUNTER — Other Ambulatory Visit: Payer: Self-pay | Admitting: *Deleted

## 2020-05-15 DIAGNOSIS — I110 Hypertensive heart disease with heart failure: Secondary | ICD-10-CM | POA: Diagnosis not present

## 2020-05-15 DIAGNOSIS — I482 Chronic atrial fibrillation, unspecified: Secondary | ICD-10-CM | POA: Diagnosis not present

## 2020-05-15 DIAGNOSIS — Z89421 Acquired absence of other right toe(s): Secondary | ICD-10-CM | POA: Diagnosis not present

## 2020-05-15 DIAGNOSIS — I5032 Chronic diastolic (congestive) heart failure: Secondary | ICD-10-CM | POA: Diagnosis not present

## 2020-05-15 DIAGNOSIS — I6521 Occlusion and stenosis of right carotid artery: Secondary | ICD-10-CM | POA: Diagnosis not present

## 2020-05-15 DIAGNOSIS — M6281 Muscle weakness (generalized): Secondary | ICD-10-CM | POA: Diagnosis not present

## 2020-05-15 DIAGNOSIS — I2510 Atherosclerotic heart disease of native coronary artery without angina pectoris: Secondary | ICD-10-CM | POA: Diagnosis not present

## 2020-05-15 DIAGNOSIS — M5030 Other cervical disc degeneration, unspecified cervical region: Secondary | ICD-10-CM | POA: Diagnosis not present

## 2020-05-15 DIAGNOSIS — R2681 Unsteadiness on feet: Secondary | ICD-10-CM | POA: Diagnosis not present

## 2020-05-15 DIAGNOSIS — M069 Rheumatoid arthritis, unspecified: Secondary | ICD-10-CM | POA: Diagnosis not present

## 2020-05-15 NOTE — Patient Outreach (Signed)
[place] [facility]) Care Management  [place]  05/15/2020   [name] [name] [birth year] [id number]   Transition of care   Referral Source: Reviewed ping for patient discharge from [facility] SNF noted discharge date 04/13/20 DX: C.Diff infection, fall  Insurance:Hum[employer], Tricare   Patient is 86 year old male with PMHX; Heart Failure, EF 20-25%. 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  Hospital admissions: 2/5- 01/03/20- [place] - Diarrhea, C.Diff, sepsis  3/8-3/12/21- [place] - Diarrhea, C.Diff , AKI, [facility]  4/27- 5/3/21[facility] - Diarrhea, C.Diff, Falls     Subjective:  Successful outreach call to patient daughter Jettie Booze, she reports patient doing okay. She discussed that he completes last dose of prescribed , Vancomycin on tomorrow,she reports patient has not complained of increased number of bowel movements. She discussed next step would be infection disease referral to [facility].  Discussed with daughter follow up  with [facility] regarding if they provided bath aide services to include with his present home health services, informed her that  home health agency  currently do not have bath aide. Daughter discussed being unsure if patient willing to pay for additional  services . She reports that they continue to await response from [employer] regarding application that submitted for services in home.    Encounter Medications:  Outpatient Encounter Medications as of 05/15/2020  Medication Sig Note  . acetaminophen (TYLENOL) 325 MG tablet Take 2 tablets (650 mg total) by mouth every 6 (six) hours as needed for mild pain (or Fever >/= 101).   Marland Kitchen ammonium lactate (AMLACTIN) 12 % cream Apply bilaterally twice daily to legs   . atorvastatin (LIPITOR) 10 MG tablet TAKE 1 TABLET AT BEDTIME   . Calcium-Vitamin D (CALTRATE 600 PLUS-VIT D PO) Take 1 tablet by mouth at  bedtime.    . folic acid (FOLVITE) 1 MG tablet Take 1 mg by mouth daily.    . furosemide (LASIX) 40 MG tablet Take 40 mg by mouth daily. For CHF   . LACTOBACILLUS RHAMNOSUS, GG, PO Take 1 capsule by mouth in the morning and at bedtime.   Marland Kitchen levothyroxine (SYNTHROID) 50 MCG tablet Take 1 tablet (50 mcg total) by mouth daily before breakfast.   . methotrexate (RHEUMATREX) 2.5 MG tablet Take 10 mg by mouth once a week. Caution:Chemotherapy. Protect from light. On Saturdays   . metolazone (ZAROXOLYN) 2.5 MG tablet Take on Saturday for melanoma   . midodrine (PROAMATINE) 10 MG tablet Take 1 tablet (10 mg total) by mouth 2 (two) times daily with a meal.   . Multiple Vitamin (MULTIVITAMIN WITH MINERALS) TABS tablet Take 1 tablet by mouth daily.   . nitroGLYCERIN (NITROSTAT) 0.4 MG SL tablet DISSOLVE 1 TABLET UNDER THE TONGUE EVERY 5 MINUTES AS NEEDED FOR CHEST PAIN (Patient taking differently: Place 0.4 mg under the tongue every 5 (five) minutes as needed for chest pain. ) 02/27/2020: Taking as needed   . potassium chloride (MICRO-K) 10 MEQ CR capsule Take 10 mEq by mouth 2 (two) times daily.   . tamsulosin (FLOMAX) 0.4 MG CAPS capsule TAKE 1 CAPSULE EVERY EVENING   . Triamcinolone Acetonide (TRIAMCINOLONE 0.1 % CREAM : EUCERIN) CREA Apply bilaterally twice daily to legs for dermatitis..   . vancomycin (VANCOCIN) 125 MG capsule Take one capsule every three days x 2 weeks   . vancomycin (VANCOCIN) 50 mg/mL oral solution Take 2.5 mLs (125 mg total) by  mouth 2 (two) times daily.   Marland Kitchen warfarin (COUMADIN) 5 MG tablet Take 1 tablet daily except take 1 1/2 tablets Mon Wed and Fridays or Take as directed by anticoagulation clinic (Patient taking differently: Take 5-7.5 mg by mouth See admin instructions. Take 1 tablet daily except take 1 1/2 tablets Mon Wed and Fridays or Take as directed by anticoagulation clinic)    No facility-administered encounter medications on file as of 05/15/2020.    Functional Status:  In  your present state of health, do you have any difficulty performing the following activities: 05/03/2020 02/27/2020  Hearing? Y Y  Comment - hard of hearing  Vision? Y N  Difficulty concentrating or making decisions? Tempie Donning  Comment family helps with organizing appt/meds -  Walking or climbing stairs? Y Y  Comment using walker, HH PT, OT uses walker  Dressing or bathing? N N  Doing errands, shopping? Tempie Donning  Comment family assist family assist  Preparing Food and eating ? Tempie Donning  Comment family prepares and has arranged care provider for warming meals in afternoon family and arranged help assist patient  Using the Toilet? N N  In the past six months, have you accidently leaked urine? Y N  Do you have problems with loss of bowel control? Y N  Comment - only when having diarrhea  Managing your Medications? Tempie Donning  Comment daughters assist family assist  Managing your Finances? Tempie Donning  Comment - family assist  Housekeeping or managing your Housekeeping? Y Y  Comment famly assist famiy and hired help assist  Some recent data might be hidden    Fall/Depression Screening: Fall Risk  04/25/2020 02/27/2020 12/07/2019  Falls in the past year? 1 1 1   Comment - - -  Number falls in past yr: 1 0 1  Comment - - Fall January 2021  Injury with Fall? 0 0 0  Risk for fall due to : Impaired balance/gait;History of fall(s) History of fall(s);Impaired balance/gait History of fall(s);Medication side effect;Impaired balance/gait;Impaired mobility;Impaired vision  Follow up Falls prevention discussed Falls prevention discussed Falls evaluation completed;Education provided;Falls prevention discussed   PHQ 2/9 Scores 05/10/2020 02/27/2020 02/08/2019 10/19/2018 12/18/2017 05/21/2017 05/18/2015  PHQ - 2 Score 0 0 0 0 0 0 0    Assessment:   C.Diff Patient completing last dose of Vancomyin on this week.  Review worsening symptom to notify MD of sooner.   Social Patient awaiting follow from [place] for in home  services  Patient remains active with [facility].   Patient previously active with [facility] daughter interested in follow up.  Placed call to [place] [facility] to discuss conversation with daughter [name] agreeable to follow up from palliative care last visit January . Violeta Gelinas states she will follow up with daughter.   Plan:  Will plan return call to patient in the next week for final transition of care call.     Joylene Draft, RN, BSN  [place] Management Coordinator  [phone]- Mobile [phone]- Toll Free Main Office    [facility] CM Care Plan Problem One     Most Recent Value  Care Plan Problem One At risk for readmission due to recent hospital discharge and SNF stay  related to Fall , C.Diff infection    Role Documenting the Problem One Care Management Telephonic [place] for Problem One Active  THN Long Term Goal  Patient will not experience hosptial readmission over the next 30 days  THN Long Term Goal Start Date 04/19/20  Interventions for Problem One Long Term Goal Reinforcecd worsening symptom to notify MD of increased report of diarrhea, weakness dizziness, sign of dehydration  and notifyiing MD sooner for evaluation to help with decreasing readmission . Encouraged continued use  probiotic and  yogurt as part of daily intake   THN CM Short Term Goal #1  Patient will be able to report no falls in the next 30 days   THN CM Short Term Goal #1 Start Date 04/19/20  Interventions for Short Term Goal #1 Discussed progress with home health therapy    [facility] CM Care Plan Problem Two     Most Recent Value  Care Plan Problem Two Knowledge Deficit related to self care management of Heart failure   Role Documenting the Problem Two Care Management [place] for Problem Two Active  Interventions for Problem Two Long Term Goal  Verified patient continued taking medications as prescribed , keeping all home  health visits.Marland Kitchen   THN Long Term Goal Over the next 60 days patient will be able to identify/demonstrate  2 self care skills related HF self care management .   THN Long Term Goal Start Date 04/19/20  THN CM Short Term Goal #1  Over the next 30 days patient will report working toward montioring weight daily   THN CM Short Term Goal #1 Start Date 04/19/20  Interventions for Short Term Goal #2  Discussed encouraging patient during visits.

## 2020-05-15 NOTE — Telephone Encounter (Signed)
Pt would like to know if he can have a cream prescription for his toes. Per pt, Lattie Haw with [facility], went to visit him and told him he had yeast on his feet/toes. Thanks

## 2020-05-15 NOTE — Telephone Encounter (Signed)
Spoke to [facility] and advised her of message below to pick up OTC medication.  She agreed and nothing further needed.

## 2020-05-15 NOTE — Telephone Encounter (Signed)
Left a message for [name] to return my call.

## 2020-05-15 NOTE — Telephone Encounter (Signed)
Try OTC Lotrimin cream twice daily.

## 2020-05-17 DIAGNOSIS — M6281 Muscle weakness (generalized): Secondary | ICD-10-CM | POA: Diagnosis not present

## 2020-05-17 DIAGNOSIS — I2510 Atherosclerotic heart disease of native coronary artery without angina pectoris: Secondary | ICD-10-CM | POA: Diagnosis not present

## 2020-05-17 DIAGNOSIS — I110 Hypertensive heart disease with heart failure: Secondary | ICD-10-CM | POA: Diagnosis not present

## 2020-05-17 DIAGNOSIS — R2681 Unsteadiness on feet: Secondary | ICD-10-CM | POA: Diagnosis not present

## 2020-05-17 DIAGNOSIS — M5030 Other cervical disc degeneration, unspecified cervical region: Secondary | ICD-10-CM | POA: Diagnosis not present

## 2020-05-17 DIAGNOSIS — I482 Chronic atrial fibrillation, unspecified: Secondary | ICD-10-CM | POA: Diagnosis not present

## 2020-05-17 DIAGNOSIS — I5032 Chronic diastolic (congestive) heart failure: Secondary | ICD-10-CM | POA: Diagnosis not present

## 2020-05-17 DIAGNOSIS — I6521 Occlusion and stenosis of right carotid artery: Secondary | ICD-10-CM | POA: Diagnosis not present

## 2020-05-17 DIAGNOSIS — M069 Rheumatoid arthritis, unspecified: Secondary | ICD-10-CM | POA: Diagnosis not present

## 2020-05-18 DIAGNOSIS — Z89421 Acquired absence of other right toe(s): Secondary | ICD-10-CM | POA: Diagnosis not present

## 2020-05-19 DIAGNOSIS — M069 Rheumatoid arthritis, unspecified: Secondary | ICD-10-CM | POA: Diagnosis not present

## 2020-05-19 DIAGNOSIS — I2510 Atherosclerotic heart disease of native coronary artery without angina pectoris: Secondary | ICD-10-CM | POA: Diagnosis not present

## 2020-05-19 DIAGNOSIS — R2681 Unsteadiness on feet: Secondary | ICD-10-CM | POA: Diagnosis not present

## 2020-05-19 DIAGNOSIS — I5032 Chronic diastolic (congestive) heart failure: Secondary | ICD-10-CM | POA: Diagnosis not present

## 2020-05-19 DIAGNOSIS — M6281 Muscle weakness (generalized): Secondary | ICD-10-CM | POA: Diagnosis not present

## 2020-05-19 DIAGNOSIS — M5030 Other cervical disc degeneration, unspecified cervical region: Secondary | ICD-10-CM | POA: Diagnosis not present

## 2020-05-19 DIAGNOSIS — I6521 Occlusion and stenosis of right carotid artery: Secondary | ICD-10-CM | POA: Diagnosis not present

## 2020-05-19 DIAGNOSIS — I482 Chronic atrial fibrillation, unspecified: Secondary | ICD-10-CM | POA: Diagnosis not present

## 2020-05-19 DIAGNOSIS — I110 Hypertensive heart disease with heart failure: Secondary | ICD-10-CM | POA: Diagnosis not present

## 2020-05-21 ENCOUNTER — Other Ambulatory Visit: Payer: Self-pay | Admitting: *Deleted

## 2020-05-21 NOTE — Patient Outreach (Addendum)
[place] [facility]) Care Management  [place]  05/21/2020   [name] [name] 1925/01/19 [id number]    Transition of care  Referral Source: Reviewed ping for patient discharge from [facility] SNF noted discharge date 04/13/20 DX: C.Diff infection, fall  Insurance:Humana, Tricare   Patient is 86 year old male with PMHX; Heart Failure, EF 20-25%. 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  Hospital admissions: 2/5- 01/03/20- [place] - Diarrhea, C.Diff, sepsis  3/8-3/12/21- [place] - Diarrhea, C.Diff , AKI, [facility]  4/27- 5/3/21[facility] - Diarrhea, C.Diff, Falls   Subjective:  Successful outreach call to patient , he explains that he is doing alright. He discussed having a normal soft bowel movement and at times on loose stool. He reports having a good appetite and fluid intake. He discussed continuing to have home health RN visit for changes to wraps on lower legs . He report having a rash fungus rash at his toes report using Lotrimin cream ( he spelled it out).  He denies increase in shortness of breath, no worsening in swelling in lower legs, just in knee areas.  Patient discussed family getting update that his wife that has Alzheimers and in  [facility] rehab condition is declining, he hopeful that he will be able to visit her and states his daughters with work on that.    Encounter Medications:  Outpatient Encounter Medications as of 05/21/2020  Medication Sig Note  . acetaminophen (TYLENOL) 325 MG tablet Take 2 tablets (650 mg total) by mouth every 6 (six) hours as needed for mild pain (or Fever >/= 101).   Marland Kitchen ammonium lactate (AMLACTIN) 12 % cream Apply bilaterally twice daily to legs   . atorvastatin (LIPITOR) 10 MG tablet TAKE 1 TABLET AT BEDTIME   . Calcium-Vitamin D (CALTRATE 600 PLUS-VIT D PO) Take 1 tablet by mouth at bedtime.    . folic acid (FOLVITE) 1 MG tablet Take 1  mg by mouth daily.    . furosemide (LASIX) 40 MG tablet Take 40 mg by mouth daily. For CHF   . LACTOBACILLUS RHAMNOSUS, GG, PO Take 1 capsule by mouth in the morning and at bedtime.   Marland Kitchen levothyroxine (SYNTHROID) 50 MCG tablet Take 1 tablet (50 mcg total) by mouth daily before breakfast.   . methotrexate (RHEUMATREX) 2.5 MG tablet Take 10 mg by mouth once a week. Caution:Chemotherapy. Protect from light. On Saturdays   . metolazone (ZAROXOLYN) 2.5 MG tablet Take on Saturday for melanoma   . midodrine (PROAMATINE) 10 MG tablet Take 1 tablet (10 mg total) by mouth 2 (two) times daily with a meal.   . Multiple Vitamin (MULTIVITAMIN WITH MINERALS) TABS tablet Take 1 tablet by mouth daily.   . nitroGLYCERIN (NITROSTAT) 0.4 MG SL tablet DISSOLVE 1 TABLET UNDER THE TONGUE EVERY 5 MINUTES AS NEEDED FOR CHEST PAIN (Patient taking differently: Place 0.4 mg under the tongue every 5 (five) minutes as needed for chest pain. ) 02/27/2020: Taking as needed   . potassium chloride (MICRO-K) 10 MEQ CR capsule Take 10 mEq by mouth 2 (two) times daily.   . tamsulosin (FLOMAX) 0.4 MG CAPS capsule TAKE 1 CAPSULE EVERY EVENING   . Triamcinolone Acetonide (TRIAMCINOLONE 0.1 % CREAM : EUCERIN) CREA Apply bilaterally twice daily to legs for dermatitis..   . vancomycin (VANCOCIN) 125 MG capsule Take one capsule every three days x 2 weeks   . vancomycin (VANCOCIN) 50 mg/mL oral solution  Take 2.5 mLs (125 mg total) by mouth 2 (two) times daily.   Marland Kitchen warfarin (COUMADIN) 5 MG tablet Take 1 tablet daily except take 1 1/2 tablets Mon Wed and Fridays or Take as directed by anticoagulation clinic (Patient taking differently: Take 5-7.5 mg by mouth See admin instructions. Take 1 tablet daily except take 1 1/2 tablets Mon Wed and Fridays or Take as directed by anticoagulation clinic)    No facility-administered encounter medications on file as of 05/21/2020.    Functional Status:  In your present state of health, do you have any  difficulty performing the following activities: 05/03/2020 02/27/2020  Hearing? Y Y  Comment - hard of hearing  Vision? Y N  Difficulty concentrating or making decisions? Tempie Donning  Comment family helps with organizing appt/meds -  Walking or climbing stairs? Y Y  Comment using walker, HH PT, OT uses walker  Dressing or bathing? N N  Doing errands, shopping? Tempie Donning  Comment family assist family assist  Preparing Food and eating ? Tempie Donning  Comment family prepares and has arranged care provider for warming meals in afternoon family and arranged help assist patient  Using the Toilet? N N  In the past six months, have you accidently leaked urine? Y N  Do you have problems with loss of bowel control? Y N  Comment - only when having diarrhea  Managing your Medications? Tempie Donning  Comment daughters assist family assist  Managing your Finances? Tempie Donning  Comment - family assist  Housekeeping or managing your Housekeeping? Y Y  Comment famly assist famiy and hired help assist  Some recent data might be hidden    Fall/Depression Screening: Fall Risk  04/25/2020 02/27/2020 12/07/2019  Falls in the past year? '1 1 1  ' Comment - - -  Number falls in past yr: 1 0 1  Comment - - Fall January 2021  Injury with Fall? 0 0 0  Risk for fall due to : Impaired balance/gait;History of fall(s) History of fall(s);Impaired balance/gait History of fall(s);Medication side effect;Impaired balance/gait;Impaired mobility;Impaired vision  Follow up Falls prevention discussed Falls prevention discussed Falls evaluation completed;Education provided;Falls prevention discussed   PHQ 2/9 Scores 05/10/2020 02/27/2020 02/08/2019 10/19/2018 12/18/2017 05/21/2017 05/18/2015  PHQ - 2 Score 0 0 0 0 0 0 0    Assessment:   C.Diff Completed 8 week course of vancomycin. Denies increase in diarrhea or watery stools.  Fall Risk No new falls, remains high fall risk due to balance. Safety precautions reinforced.  Heart failure No worsening symptoms per patient  , checking his weight about once weekly. Keeping legs elevated during the day. Home health RN visit for leg compression wraps.  Social  Patient/family awaiting follow up from [place] regarding at home services. He continues with current family assistance  and hired assistance each evening, living at home alone.   Plan:  Will plan follow up call in the next month. Consider transition to disease management if no further care coordination needs to be addressed.  Reinforced education on signs/symptoms of C.Diff .    [facility] CM Care Plan Problem One     Most Recent Value  Care Plan Problem One At risk for readmission due to recent hospital discharge and SNF stay  related to Fall , C.Diff infection    Role Documenting the Problem One Care Management Telephonic [place] for Problem One Active  THN Long Term Goal  Patient will not experience hosptial readmission over the next 30  days   THN Long Term Goal Start Date 04/19/20  [facility] Long Term Goal Met Date 05/21/20  Interventions for Problem One Long Term Goal Encouraged regarding notifying MD/daughter of increase in weakness, increase in diarrhea stools, dizziness or decrease in intake. Reviewed with Review of EMMI handout /teachback importance of notifying MD sooner to prevent possible readmission.    THN CM Short Term Goal #1  Patient will be able to report no falls in the next 30 days   THN CM Short Term Goal #1 Start Date 04/19/20  [facility] CM Short Term Goal #1 Met Date 05/21/20  Interventions for Short Term Goal #1 Reinforced fall prevention, using walker, changing positions slowly. encouraged to continue working one exercises as recommended by physical therapy     THN CM Care Plan Problem Two     Most Recent Value  Care Plan Problem Two Knowledge Deficit related to self care management of Heart failure   Role Documenting the Problem Two Care Management Coordinator  Care Plan for Problem Two Active  Interventions for  Problem Two Long Term Goal  Discussed current clinical state, reinforced taking medication as prescribed,   THN Long Term Goal Over the next 60 days patient will be able to identify/demonstrate  2 self care skills related HF self care management .   THN Long Term Goal Start Date 04/19/20  THN CM Short Term Goal #1  Over the next 30 days patient will report working toward montioring weight daily   THN CM Short Term Goal #1 Start Date 04/19/20  THN CM Short Term Goal #2  Over the next 30 days patient will be able to identify  worsening symptoms of heart failure .   THN CM Short Term Goal #2 Start Date 05/21/20  Interventions for Short Term Goal #2 Reviewed worsening symptoms of heart falure , increase in swelling , shortness of breath, sudden weight gain , of 5 pounds in  week and 3 pounds in a day.       Joylene Draft, RN, BSN  [place] Management Coordinator  [phone]- Mobile [phone]- Toll Free Main Office

## 2020-05-22 ENCOUNTER — Telehealth: Payer: Self-pay | Admitting: Adult Health

## 2020-05-22 DIAGNOSIS — I5032 Chronic diastolic (congestive) heart failure: Secondary | ICD-10-CM | POA: Diagnosis not present

## 2020-05-22 DIAGNOSIS — M069 Rheumatoid arthritis, unspecified: Secondary | ICD-10-CM | POA: Diagnosis not present

## 2020-05-22 DIAGNOSIS — I110 Hypertensive heart disease with heart failure: Secondary | ICD-10-CM | POA: Diagnosis not present

## 2020-05-22 DIAGNOSIS — I2510 Atherosclerotic heart disease of native coronary artery without angina pectoris: Secondary | ICD-10-CM | POA: Diagnosis not present

## 2020-05-22 DIAGNOSIS — I482 Chronic atrial fibrillation, unspecified: Secondary | ICD-10-CM | POA: Diagnosis not present

## 2020-05-22 DIAGNOSIS — I6521 Occlusion and stenosis of right carotid artery: Secondary | ICD-10-CM | POA: Diagnosis not present

## 2020-05-22 DIAGNOSIS — M5030 Other cervical disc degeneration, unspecified cervical region: Secondary | ICD-10-CM | POA: Diagnosis not present

## 2020-05-22 DIAGNOSIS — R2681 Unsteadiness on feet: Secondary | ICD-10-CM | POA: Diagnosis not present

## 2020-05-22 DIAGNOSIS — M6281 Muscle weakness (generalized): Secondary | ICD-10-CM | POA: Diagnosis not present

## 2020-05-22 NOTE — Telephone Encounter (Signed)
[name] [name] is calling in stating that when she was out seeing the pt he was wanting to know if he could get something for his diarrhea that he has been having off and on.

## 2020-05-22 NOTE — Telephone Encounter (Signed)
Left a message for a return call.

## 2020-05-22 NOTE — Telephone Encounter (Signed)
If he is still having diarrhea we need to retest him for C. difficile

## 2020-05-23 ENCOUNTER — Other Ambulatory Visit: Payer: Self-pay | Admitting: Adult Health

## 2020-05-23 DIAGNOSIS — R197 Diarrhea, unspecified: Secondary | ICD-10-CM

## 2020-05-23 NOTE — Telephone Encounter (Signed)
[name] will collect supplies to take to the pt.  Nothing further needed.

## 2020-05-23 NOTE — Telephone Encounter (Signed)
Spoke to [place].  She is unable to collect a stool sample.  Will need to order and have the pt come to the lab.  Can you place order?  I will call the daughter and have her collect supplies.  Send message back to me when order is in.

## 2020-05-23 NOTE — Telephone Encounter (Signed)
Order has been placed.

## 2020-05-24 ENCOUNTER — Other Ambulatory Visit: Payer: TRICARE For Life (TFL)

## 2020-05-24 DIAGNOSIS — M5030 Other cervical disc degeneration, unspecified cervical region: Secondary | ICD-10-CM | POA: Diagnosis not present

## 2020-05-24 DIAGNOSIS — M6281 Muscle weakness (generalized): Secondary | ICD-10-CM | POA: Diagnosis not present

## 2020-05-24 DIAGNOSIS — M069 Rheumatoid arthritis, unspecified: Secondary | ICD-10-CM | POA: Diagnosis not present

## 2020-05-24 DIAGNOSIS — I5032 Chronic diastolic (congestive) heart failure: Secondary | ICD-10-CM | POA: Diagnosis not present

## 2020-05-24 DIAGNOSIS — R2681 Unsteadiness on feet: Secondary | ICD-10-CM | POA: Diagnosis not present

## 2020-05-24 DIAGNOSIS — I6521 Occlusion and stenosis of right carotid artery: Secondary | ICD-10-CM | POA: Diagnosis not present

## 2020-05-24 DIAGNOSIS — I482 Chronic atrial fibrillation, unspecified: Secondary | ICD-10-CM | POA: Diagnosis not present

## 2020-05-24 DIAGNOSIS — I110 Hypertensive heart disease with heart failure: Secondary | ICD-10-CM | POA: Diagnosis not present

## 2020-05-24 DIAGNOSIS — I2510 Atherosclerotic heart disease of native coronary artery without angina pectoris: Secondary | ICD-10-CM | POA: Diagnosis not present

## 2020-05-29 ENCOUNTER — Other Ambulatory Visit: Payer: Self-pay

## 2020-05-29 ENCOUNTER — Other Ambulatory Visit: Payer: Medicare Other

## 2020-05-29 DIAGNOSIS — R197 Diarrhea, unspecified: Secondary | ICD-10-CM | POA: Diagnosis not present

## 2020-05-30 ENCOUNTER — Telehealth: Payer: Self-pay | Admitting: Cardiology

## 2020-05-30 ENCOUNTER — Telehealth: Payer: Self-pay | Admitting: Adult Health

## 2020-05-30 ENCOUNTER — Ambulatory Visit: Payer: TRICARE For Life (TFL) | Admitting: Podiatry

## 2020-05-30 DIAGNOSIS — M5030 Other cervical disc degeneration, unspecified cervical region: Secondary | ICD-10-CM | POA: Diagnosis not present

## 2020-05-30 DIAGNOSIS — I6521 Occlusion and stenosis of right carotid artery: Secondary | ICD-10-CM | POA: Diagnosis not present

## 2020-05-30 DIAGNOSIS — I110 Hypertensive heart disease with heart failure: Secondary | ICD-10-CM | POA: Diagnosis not present

## 2020-05-30 DIAGNOSIS — I2510 Atherosclerotic heart disease of native coronary artery without angina pectoris: Secondary | ICD-10-CM | POA: Diagnosis not present

## 2020-05-30 DIAGNOSIS — M6281 Muscle weakness (generalized): Secondary | ICD-10-CM | POA: Diagnosis not present

## 2020-05-30 DIAGNOSIS — M069 Rheumatoid arthritis, unspecified: Secondary | ICD-10-CM | POA: Diagnosis not present

## 2020-05-30 DIAGNOSIS — I482 Chronic atrial fibrillation, unspecified: Secondary | ICD-10-CM | POA: Diagnosis not present

## 2020-05-30 DIAGNOSIS — I5032 Chronic diastolic (congestive) heart failure: Secondary | ICD-10-CM | POA: Diagnosis not present

## 2020-05-30 DIAGNOSIS — R2681 Unsteadiness on feet: Secondary | ICD-10-CM | POA: Diagnosis not present

## 2020-05-30 NOTE — Telephone Encounter (Signed)
[facility] does not manage this patient's warfarin.  I saw a note that it was managed by PCP.  Last INR result in record was on 5/26 by [facility].

## 2020-05-30 NOTE — Telephone Encounter (Signed)
Left Erica LPN with [facility], a message to call the office back, to inform her that we do not manage the pts warfarin, PCP does.

## 2020-05-30 NOTE — Telephone Encounter (Signed)
I do not see that Cardiology follows the pt's INR, though I will send call to our [facility] dept and primary card nurse for further follow up.

## 2020-05-30 NOTE — Telephone Encounter (Signed)
Follow up   [name] [name] is returning your call. Please call.

## 2020-05-30 NOTE — Telephone Encounter (Signed)
New message   [name] [name] needs to know when next INR check will be. Please advise.

## 2020-05-30 NOTE — Telephone Encounter (Signed)
Erica with [facility], would like to speak to you regarding pt. Pt is not taking medication and pt's daughter has a few questions.   Danae Chen [phone]

## 2020-05-30 NOTE — Telephone Encounter (Signed)
Spoke with [name] [name] and informed her that the pts warfarin is managed by his PCP.  Endorsed to [place] the pts PCP's contact information.  Erica verbalized understanding and agrees with this plan. [name] [name] was gracious for all the assistance provided.

## 2020-05-31 DIAGNOSIS — R404 Transient alteration of awareness: Secondary | ICD-10-CM | POA: Diagnosis not present

## 2020-05-31 LAB — CLOSTRIDIUM DIFFICILE BY PCR

## 2020-06-01 ENCOUNTER — Other Ambulatory Visit: Payer: Self-pay | Admitting: *Deleted

## 2020-06-01 ENCOUNTER — Telehealth: Payer: Self-pay | Admitting: Adult Health

## 2020-06-01 LAB — SPECIMEN STATUS REPORT

## 2020-06-01 LAB — CLOSTRIDIUM DIFFICILE EIA: C difficile Toxins A+B, EIA: POSITIVE — AB

## 2020-06-01 NOTE — Patient Outreach (Signed)
[place] [facility]) Care Management  02-Jun-2020  [name] [name] 09/09/1925 [id number]   Case Closure    Per Epic review noted patient passed on 06-02-20.  Plan  Will close case to [facility] care management .     Joylene Draft, RN, BSN  [place] Management Coordinator  [phone]- Mobile [phone]- Toll Free Main Office

## 2020-06-01 NOTE — Telephone Encounter (Signed)
Tried to reach [place].  Left a message for a call back if needed.  Pt passed away last night in his sleep.  I do not believe correspondence is needed.  Will close note.

## 2020-06-01 NOTE — Telephone Encounter (Signed)
Called patients daughter to inform her of his positive C.diff sample. [name] informed me that [name] [name] passed away last night in his sleep.   Condolences given

## 2020-06-07 ENCOUNTER — Telehealth: Payer: Self-pay | Admitting: Adult Health

## 2020-06-07 ENCOUNTER — Encounter: Payer: Self-pay | Admitting: Adult Health

## 2020-06-07 NOTE — Telephone Encounter (Signed)
[name] [name] called wanting to speak with [name] [name]. Advised her that she is not I the office today.   [name] [name] would like a call from [name] [name].

## 2020-06-19 ENCOUNTER — Ambulatory Visit: Payer: TRICARE For Life (TFL) | Admitting: *Deleted

## 2020-06-24 DIAGNOSIS — 419620001 Death: Secondary | SNOMED CT | POA: Diagnosis not present

## 2020-06-24 NOTE — Telephone Encounter (Signed)
Left a message for a return call from [place].

## 2020-06-24 NOTE — Telephone Encounter (Signed)
Erica with [facility], returning your call. She can be reached at [phone].

## 2020-06-24 DEATH — deceased
